# Patient Record
Sex: Female | Born: 1935
Health system: Southern US, Community
[De-identification: ages and names within clinical notes are randomized; demographics above are authoritative.]

## PROBLEM LIST (undated history)

## (undated) DIAGNOSIS — F32A Depression, unspecified: Secondary | ICD-10-CM

## (undated) DIAGNOSIS — A0472 Enterocolitis due to Clostridium difficile, not specified as recurrent: Secondary | ICD-10-CM

## (undated) DIAGNOSIS — Z992 Dependence on renal dialysis: Secondary | ICD-10-CM

## (undated) DIAGNOSIS — F329 Major depressive disorder, single episode, unspecified: Secondary | ICD-10-CM

## (undated) DIAGNOSIS — D649 Anemia, unspecified: Secondary | ICD-10-CM

## (undated) DIAGNOSIS — E1140 Type 2 diabetes mellitus with diabetic neuropathy, unspecified: Secondary | ICD-10-CM

## (undated) DIAGNOSIS — Z89511 Acquired absence of right leg below knee: Secondary | ICD-10-CM

## (undated) DIAGNOSIS — I639 Cerebral infarction, unspecified: Secondary | ICD-10-CM

## (undated) DIAGNOSIS — Z22322 Carrier or suspected carrier of Methicillin resistant Staphylococcus aureus: Secondary | ICD-10-CM

## (undated) DIAGNOSIS — E669 Obesity, unspecified: Secondary | ICD-10-CM

## (undated) DIAGNOSIS — M1990 Unspecified osteoarthritis, unspecified site: Secondary | ICD-10-CM

## (undated) DIAGNOSIS — K219 Gastro-esophageal reflux disease without esophagitis: Secondary | ICD-10-CM

## (undated) DIAGNOSIS — N186 End stage renal disease: Secondary | ICD-10-CM

## (undated) DIAGNOSIS — I739 Peripheral vascular disease, unspecified: Secondary | ICD-10-CM

## (undated) HISTORY — PX: COLONOSCOPY: SHX174

## (undated) HISTORY — DX: Obesity, unspecified: E66.9

## (undated) HISTORY — DX: Enterocolitis due to Clostridium difficile, not specified as recurrent: A04.72

## (undated) HISTORY — DX: Anemia, unspecified: D64.9

## (undated) HISTORY — DX: Peripheral vascular disease, unspecified: I73.9

## (undated) HISTORY — PX: ABDOMINAL HYSTERECTOMY: SHX81

## (undated) HISTORY — DX: Unspecified osteoarthritis, unspecified site: M19.90

## (undated) HISTORY — DX: Major depressive disorder, single episode, unspecified: F32.9

## (undated) HISTORY — DX: Carrier or suspected carrier of methicillin resistant Staphylococcus aureus: Z22.322

## (undated) HISTORY — DX: Cerebral infarction, unspecified: I63.9

## (undated) HISTORY — DX: Depression, unspecified: F32.A

## (undated) HISTORY — DX: Gastro-esophageal reflux disease without esophagitis: K21.9

## (undated) HISTORY — DX: Type 2 diabetes mellitus with diabetic neuropathy, unspecified: E11.40

## (undated) HISTORY — PX: MULTIPLE TOOTH EXTRACTIONS: SHX2053

---

## 2002-10-29 ENCOUNTER — Ambulatory Visit (HOSPITAL_COMMUNITY): Admission: RE | Admit: 2002-10-29 | Discharge: 2002-10-29 | Payer: Self-pay | Admitting: Internal Medicine

## 2002-10-29 ENCOUNTER — Encounter: Payer: Self-pay | Admitting: Internal Medicine

## 2002-11-05 ENCOUNTER — Encounter: Payer: Self-pay | Admitting: Internal Medicine

## 2002-11-05 ENCOUNTER — Ambulatory Visit (HOSPITAL_COMMUNITY): Admission: RE | Admit: 2002-11-05 | Discharge: 2002-11-05 | Payer: Self-pay | Admitting: Internal Medicine

## 2002-11-20 ENCOUNTER — Ambulatory Visit (HOSPITAL_COMMUNITY): Admission: RE | Admit: 2002-11-20 | Discharge: 2002-11-20 | Payer: Self-pay | Admitting: Internal Medicine

## 2002-11-20 ENCOUNTER — Encounter: Payer: Self-pay | Admitting: Internal Medicine

## 2002-12-27 ENCOUNTER — Ambulatory Visit (HOSPITAL_COMMUNITY): Admission: RE | Admit: 2002-12-27 | Discharge: 2002-12-27 | Payer: Self-pay | Admitting: Orthopedic Surgery

## 2003-07-13 ENCOUNTER — Ambulatory Visit (HOSPITAL_COMMUNITY): Admission: RE | Admit: 2003-07-13 | Discharge: 2003-07-13 | Payer: Self-pay | Admitting: Internal Medicine

## 2003-08-30 ENCOUNTER — Emergency Department (HOSPITAL_COMMUNITY): Admission: EM | Admit: 2003-08-30 | Discharge: 2003-08-31 | Payer: Self-pay | Admitting: Emergency Medicine

## 2003-11-28 ENCOUNTER — Encounter: Admission: RE | Admit: 2003-11-28 | Discharge: 2003-11-28 | Payer: Self-pay | Admitting: Internal Medicine

## 2004-06-08 ENCOUNTER — Inpatient Hospital Stay (HOSPITAL_COMMUNITY): Admission: AD | Admit: 2004-06-08 | Discharge: 2004-06-12 | Payer: Self-pay | Admitting: Internal Medicine

## 2004-06-08 ENCOUNTER — Ambulatory Visit: Payer: Self-pay | Admitting: Physical Medicine & Rehabilitation

## 2004-06-12 ENCOUNTER — Inpatient Hospital Stay: Admission: RE | Admit: 2004-06-12 | Discharge: 2004-06-23 | Payer: Self-pay | Admitting: *Deleted

## 2004-06-13 ENCOUNTER — Encounter (INDEPENDENT_AMBULATORY_CARE_PROVIDER_SITE_OTHER): Payer: Self-pay | Admitting: *Deleted

## 2005-01-18 ENCOUNTER — Encounter (HOSPITAL_COMMUNITY): Admission: RE | Admit: 2005-01-18 | Discharge: 2005-04-18 | Payer: Self-pay | Admitting: Nephrology

## 2005-01-19 ENCOUNTER — Ambulatory Visit: Payer: Self-pay | Admitting: Oncology

## 2005-02-25 ENCOUNTER — Encounter: Admission: RE | Admit: 2005-02-25 | Discharge: 2005-02-25 | Payer: Self-pay | Admitting: Family Medicine

## 2005-03-25 ENCOUNTER — Ambulatory Visit: Payer: Self-pay | Admitting: Oncology

## 2005-04-26 ENCOUNTER — Encounter (HOSPITAL_COMMUNITY): Admission: RE | Admit: 2005-04-26 | Discharge: 2005-07-25 | Payer: Self-pay | Admitting: Nephrology

## 2005-06-25 ENCOUNTER — Ambulatory Visit: Payer: Self-pay | Admitting: Oncology

## 2005-08-02 ENCOUNTER — Encounter (HOSPITAL_COMMUNITY): Admission: RE | Admit: 2005-08-02 | Discharge: 2005-10-31 | Payer: Self-pay | Admitting: Nephrology

## 2005-10-14 ENCOUNTER — Ambulatory Visit: Payer: Self-pay | Admitting: Oncology

## 2005-10-18 LAB — CBC WITH DIFFERENTIAL/PLATELET
BASO%: 0.1 % (ref 0.0–2.0)
Basophils Absolute: 0 10*3/uL (ref 0.0–0.1)
EOS%: 1.7 % (ref 0.0–7.0)
HCT: 34 % — ABNORMAL LOW (ref 34.8–46.6)
HGB: 10.4 g/dL — ABNORMAL LOW (ref 11.6–15.9)
LYMPH%: 12.3 % — ABNORMAL LOW (ref 14.0–48.0)
MCH: 23.8 pg — ABNORMAL LOW (ref 26.0–34.0)
MCHC: 30.8 g/dL — ABNORMAL LOW (ref 32.0–36.0)
MCV: 77.4 fL — ABNORMAL LOW (ref 81.0–101.0)
NEUT%: 81.4 % — ABNORMAL HIGH (ref 39.6–76.8)
Platelets: 412 10*3/uL — ABNORMAL HIGH (ref 145–400)

## 2005-10-18 LAB — IGG, IGA, IGM
IgG (Immunoglobin G), Serum: 1100 mg/dL (ref 694–1618)
IgM, Serum: 189 mg/dL (ref 60–263)

## 2005-10-18 LAB — IRON AND TIBC: Iron: 39 ug/dL — ABNORMAL LOW (ref 42–145)

## 2005-10-18 LAB — COMPREHENSIVE METABOLIC PANEL
ALT: <8 U/L (ref 0–40)
AST: 8 U/L (ref 0–37)
Alkaline Phosphatase: 124 U/L — ABNORMAL HIGH (ref 39–117)
CO2: 20 mEq/L (ref 19–32)
Sodium: 138 mEq/L (ref 135–145)
Total Bilirubin: 0.3 mg/dL (ref 0.3–1.2)
Total Protein: 6.9 g/dL (ref 6.0–8.3)

## 2005-10-18 LAB — FERRITIN: Ferritin: 744 ng/mL — ABNORMAL HIGH (ref 10–291)

## 2005-11-01 ENCOUNTER — Encounter (HOSPITAL_COMMUNITY): Admission: RE | Admit: 2005-11-01 | Discharge: 2006-01-21 | Payer: Self-pay | Admitting: Nephrology

## 2006-01-31 ENCOUNTER — Encounter (HOSPITAL_COMMUNITY): Admission: RE | Admit: 2006-01-31 | Discharge: 2006-04-29 | Payer: Self-pay | Admitting: Nephrology

## 2006-02-14 ENCOUNTER — Ambulatory Visit: Payer: Self-pay | Admitting: Oncology

## 2006-03-02 ENCOUNTER — Encounter: Admission: RE | Admit: 2006-03-02 | Discharge: 2006-03-02 | Payer: Self-pay | Admitting: Family Medicine

## 2006-04-21 ENCOUNTER — Encounter (INDEPENDENT_AMBULATORY_CARE_PROVIDER_SITE_OTHER): Payer: Self-pay | Admitting: *Deleted

## 2006-04-21 ENCOUNTER — Encounter: Admission: RE | Admit: 2006-04-21 | Discharge: 2006-04-21 | Payer: Self-pay | Admitting: Oncology

## 2006-05-02 ENCOUNTER — Encounter (HOSPITAL_COMMUNITY): Admission: RE | Admit: 2006-05-02 | Discharge: 2006-07-31 | Payer: Self-pay | Admitting: Nephrology

## 2006-05-24 ENCOUNTER — Encounter: Admission: RE | Admit: 2006-05-24 | Discharge: 2006-05-24 | Payer: Self-pay | Admitting: Surgery

## 2006-05-25 ENCOUNTER — Ambulatory Visit (HOSPITAL_BASED_OUTPATIENT_CLINIC_OR_DEPARTMENT_OTHER): Admission: RE | Admit: 2006-05-25 | Discharge: 2006-05-25 | Payer: Self-pay | Admitting: Surgery

## 2006-05-25 ENCOUNTER — Encounter (INDEPENDENT_AMBULATORY_CARE_PROVIDER_SITE_OTHER): Payer: Self-pay | Admitting: *Deleted

## 2006-05-25 ENCOUNTER — Encounter: Admission: RE | Admit: 2006-05-25 | Discharge: 2006-05-25 | Payer: Self-pay | Admitting: Surgery

## 2006-05-25 HISTORY — PX: BREAST EXCISIONAL BIOPSY: SUR124

## 2006-06-08 ENCOUNTER — Ambulatory Visit: Payer: Self-pay | Admitting: Oncology

## 2006-06-13 LAB — CBC WITH DIFFERENTIAL/PLATELET
BASO%: 0.3 % (ref 0.0–2.0)
EOS%: 2.2 % (ref 0.0–7.0)
LYMPH%: 15 % (ref 14.0–48.0)
MCHC: 31.7 g/dL — ABNORMAL LOW (ref 32.0–36.0)
MCV: 74.1 fL — ABNORMAL LOW (ref 81.0–101.0)
MONO%: 4.9 % (ref 0.0–13.0)
NEUT#: 7.1 10*3/uL — ABNORMAL HIGH (ref 1.5–6.5)
Platelets: 347 10*3/uL (ref 145–400)
RBC: 4.93 10*6/uL (ref 3.70–5.32)
RDW: 16.7 % — ABNORMAL HIGH (ref 11.3–14.5)

## 2006-06-20 LAB — COMPREHENSIVE METABOLIC PANEL
ALT: 8 U/L (ref 0–35)
AST: 11 U/L (ref 0–37)
Albumin: 3.6 g/dL (ref 3.5–5.2)
Alkaline Phosphatase: 101 U/L (ref 39–117)
Potassium: 4.6 mEq/L (ref 3.5–5.3)
Sodium: 139 mEq/L (ref 135–145)
Total Bilirubin: 0.4 mg/dL (ref 0.3–1.2)
Total Protein: 6.5 g/dL (ref 6.0–8.3)

## 2006-06-20 LAB — IMMUNOFIXATION ELECTROPHORESIS
IgM, Serum: 199 mg/dL (ref 60–263)
Total Protein, Serum Electrophoresis: 6.5 g/dL (ref 6.0–8.3)

## 2006-08-08 ENCOUNTER — Encounter (HOSPITAL_COMMUNITY): Admission: RE | Admit: 2006-08-08 | Discharge: 2006-11-06 | Payer: Self-pay | Admitting: Nephrology

## 2006-09-22 ENCOUNTER — Emergency Department (HOSPITAL_COMMUNITY): Admission: EM | Admit: 2006-09-22 | Discharge: 2006-09-23 | Payer: Self-pay | Admitting: Family Medicine

## 2006-10-03 ENCOUNTER — Emergency Department (HOSPITAL_COMMUNITY): Admission: EM | Admit: 2006-10-03 | Discharge: 2006-10-03 | Payer: Self-pay | Admitting: Family Medicine

## 2006-10-06 ENCOUNTER — Ambulatory Visit: Payer: Self-pay | Admitting: Oncology

## 2006-10-10 LAB — CBC WITH DIFFERENTIAL/PLATELET
Basophils Absolute: 0 10*3/uL (ref 0.0–0.1)
EOS%: 0.1 % (ref 0.0–7.0)
HGB: 10.1 g/dL — ABNORMAL LOW (ref 11.6–15.9)
MCH: 24.1 pg — ABNORMAL LOW (ref 26.0–34.0)
MONO#: 0.7 10*3/uL (ref 0.1–0.9)
NEUT#: 19 10*3/uL — ABNORMAL HIGH (ref 1.5–6.5)
RDW: 17.2 % — ABNORMAL HIGH (ref 11.3–14.5)
WBC: 20.6 10*3/uL — ABNORMAL HIGH (ref 3.9–10.0)
lymph#: 0.9 10*3/uL (ref 0.9–3.3)

## 2006-10-10 LAB — COMPREHENSIVE METABOLIC PANEL
ALT: 31 U/L (ref 0–35)
AST: 33 U/L (ref 0–37)
Albumin: 3.4 g/dL — ABNORMAL LOW (ref 3.5–5.2)
BUN: 87 mg/dL — ABNORMAL HIGH (ref 6–23)
CO2: 14 mEq/L — ABNORMAL LOW (ref 19–32)
Calcium: 8.7 mg/dL (ref 8.4–10.5)
Chloride: 109 mEq/L (ref 96–112)
Potassium: 4.9 mEq/L (ref 3.5–5.3)

## 2006-10-10 LAB — LACTATE DEHYDROGENASE: LDH: 214 U/L (ref 94–250)

## 2006-10-10 LAB — IGG, IGA, IGM
IgA: 207 mg/dL (ref 68–378)
IgG (Immunoglobin G), Serum: 966 mg/dL (ref 694–1618)

## 2006-10-13 ENCOUNTER — Inpatient Hospital Stay (HOSPITAL_COMMUNITY): Admission: EM | Admit: 2006-10-13 | Discharge: 2006-10-20 | Payer: Self-pay | Admitting: *Deleted

## 2006-10-13 ENCOUNTER — Encounter (INDEPENDENT_AMBULATORY_CARE_PROVIDER_SITE_OTHER): Payer: Self-pay | Admitting: Diagnostic Radiology

## 2006-10-18 ENCOUNTER — Ambulatory Visit: Payer: Self-pay | Admitting: Physical Medicine & Rehabilitation

## 2006-11-07 ENCOUNTER — Inpatient Hospital Stay (HOSPITAL_COMMUNITY): Admission: EM | Admit: 2006-11-07 | Discharge: 2006-11-25 | Payer: Self-pay | Admitting: Emergency Medicine

## 2006-11-14 ENCOUNTER — Ambulatory Visit: Payer: Self-pay | Admitting: Thoracic Surgery

## 2006-11-14 ENCOUNTER — Ambulatory Visit: Payer: Self-pay | Admitting: Infectious Diseases

## 2006-11-24 ENCOUNTER — Encounter (INDEPENDENT_AMBULATORY_CARE_PROVIDER_SITE_OTHER): Payer: Self-pay | Admitting: Internal Medicine

## 2006-12-05 ENCOUNTER — Encounter (HOSPITAL_COMMUNITY): Admission: RE | Admit: 2006-12-05 | Discharge: 2007-03-05 | Payer: Self-pay | Admitting: Nephrology

## 2006-12-14 ENCOUNTER — Emergency Department (HOSPITAL_COMMUNITY): Admission: EM | Admit: 2006-12-14 | Discharge: 2006-12-14 | Payer: Self-pay | Admitting: Emergency Medicine

## 2006-12-16 ENCOUNTER — Inpatient Hospital Stay (HOSPITAL_COMMUNITY): Admission: EM | Admit: 2006-12-16 | Discharge: 2007-01-05 | Payer: Self-pay | Admitting: Emergency Medicine

## 2006-12-16 ENCOUNTER — Ambulatory Visit: Payer: Self-pay | Admitting: Pulmonary Disease

## 2006-12-17 ENCOUNTER — Encounter (INDEPENDENT_AMBULATORY_CARE_PROVIDER_SITE_OTHER): Payer: Self-pay | Admitting: *Deleted

## 2006-12-23 ENCOUNTER — Ambulatory Visit: Payer: Self-pay | Admitting: Infectious Diseases

## 2006-12-24 ENCOUNTER — Encounter (INDEPENDENT_AMBULATORY_CARE_PROVIDER_SITE_OTHER): Payer: Self-pay | Admitting: Internal Medicine

## 2006-12-24 ENCOUNTER — Ambulatory Visit: Payer: Self-pay | Admitting: *Deleted

## 2007-03-08 ENCOUNTER — Ambulatory Visit: Payer: Self-pay | Admitting: Vascular Surgery

## 2007-03-20 ENCOUNTER — Ambulatory Visit: Payer: Self-pay | Admitting: Vascular Surgery

## 2007-03-20 ENCOUNTER — Ambulatory Visit (HOSPITAL_COMMUNITY): Admission: RE | Admit: 2007-03-20 | Discharge: 2007-03-20 | Payer: Self-pay | Admitting: Vascular Surgery

## 2007-03-29 ENCOUNTER — Ambulatory Visit (HOSPITAL_COMMUNITY): Admission: RE | Admit: 2007-03-29 | Discharge: 2007-03-29 | Payer: Self-pay | Admitting: Vascular Surgery

## 2007-04-03 ENCOUNTER — Ambulatory Visit: Payer: Self-pay | Admitting: Oncology

## 2007-04-19 ENCOUNTER — Ambulatory Visit: Payer: Self-pay | Admitting: Vascular Surgery

## 2007-04-24 ENCOUNTER — Ambulatory Visit: Payer: Self-pay | Admitting: Vascular Surgery

## 2007-04-24 ENCOUNTER — Ambulatory Visit (HOSPITAL_COMMUNITY): Admission: RE | Admit: 2007-04-24 | Discharge: 2007-04-24 | Payer: Self-pay | Admitting: Vascular Surgery

## 2007-04-29 ENCOUNTER — Emergency Department (HOSPITAL_COMMUNITY): Admission: EM | Admit: 2007-04-29 | Discharge: 2007-04-29 | Payer: Self-pay | Admitting: Emergency Medicine

## 2007-05-02 ENCOUNTER — Ambulatory Visit (HOSPITAL_COMMUNITY): Admission: RE | Admit: 2007-05-02 | Discharge: 2007-05-02 | Payer: Self-pay | Admitting: Vascular Surgery

## 2007-05-09 ENCOUNTER — Ambulatory Visit (HOSPITAL_COMMUNITY): Admission: RE | Admit: 2007-05-09 | Discharge: 2007-05-09 | Payer: Self-pay | Admitting: Vascular Surgery

## 2007-06-21 ENCOUNTER — Ambulatory Visit: Payer: Self-pay | Admitting: Vascular Surgery

## 2007-07-05 ENCOUNTER — Ambulatory Visit: Payer: Self-pay | Admitting: Vascular Surgery

## 2007-08-25 IMAGING — CR DG CHEST 1V
1 series · 1 of 1 positions shown · non-contrast
Comparison: 12/26/06.

CLINICAL DATA: Renal failure.  Status post dialysis.
 CHEST ? 1 VIEW ? 12/29/06:

[view not recorded]
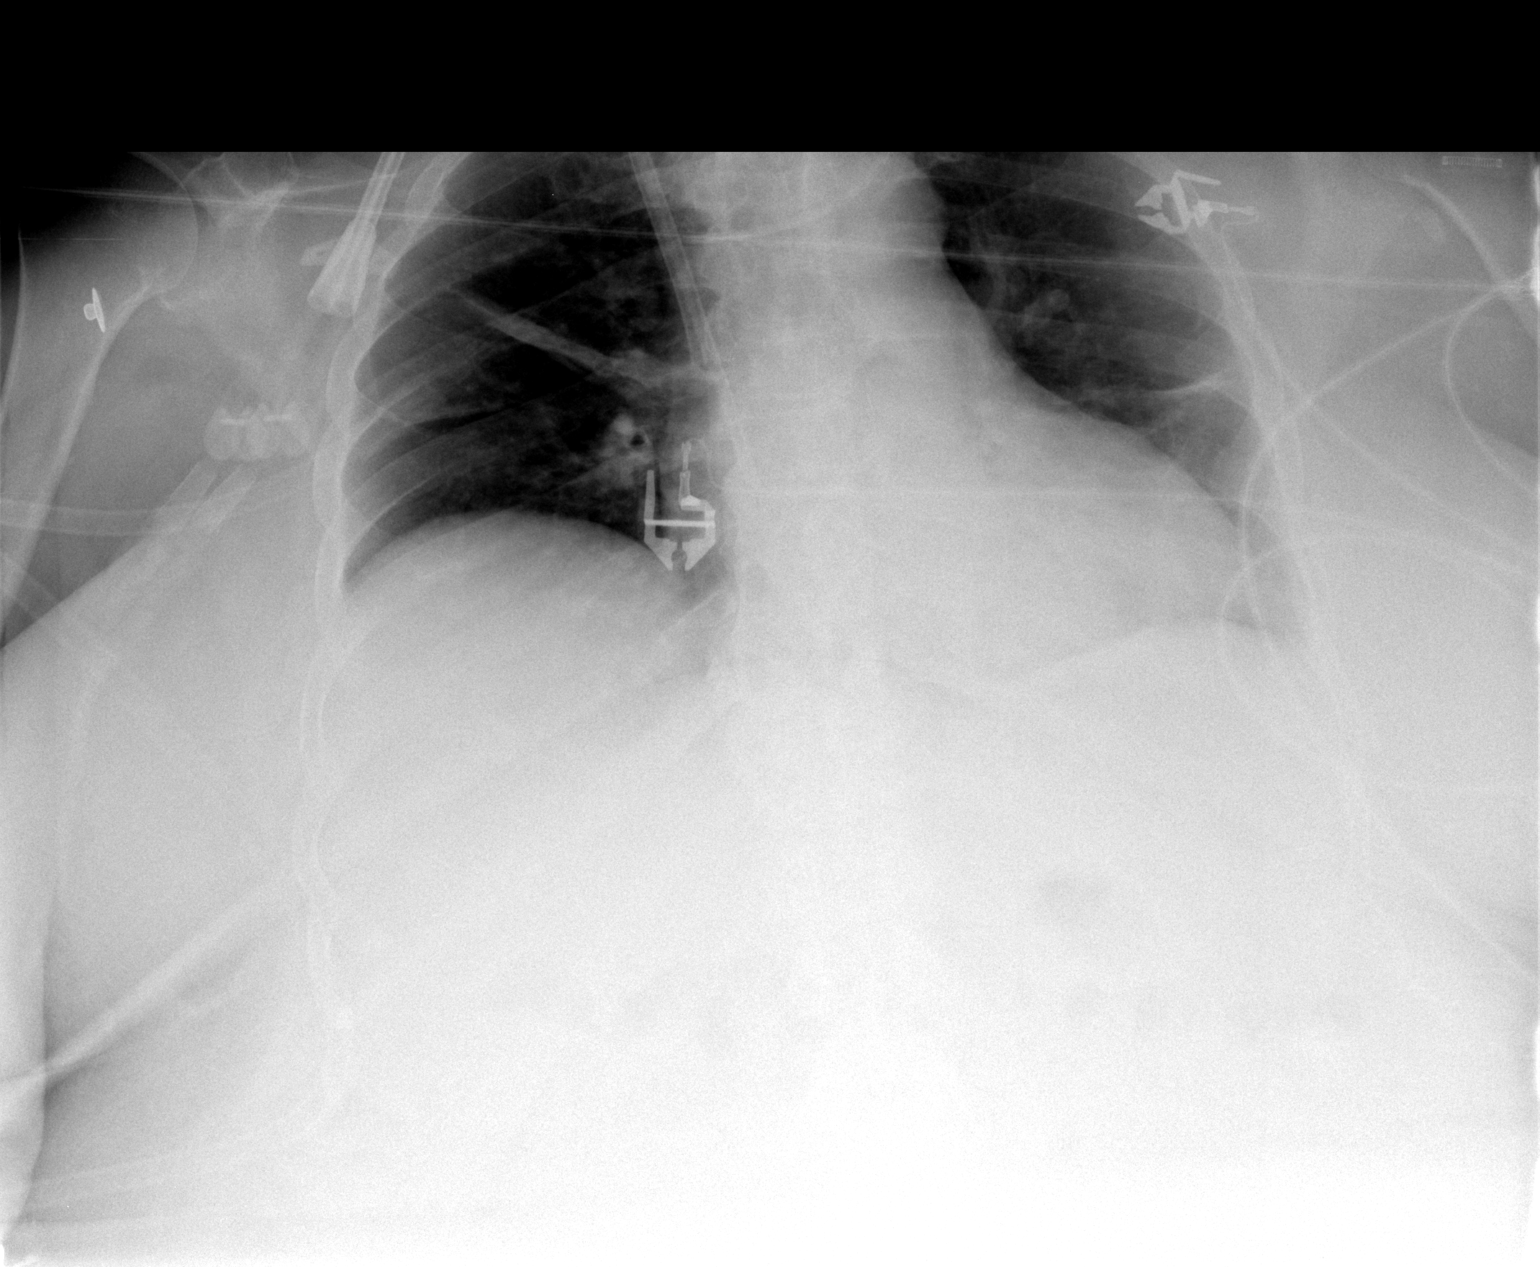

[1 of 1 positions shown; findings below may reference images not displayed]

FINDINGS: Pulmonary edema is markedly improved.  Mild bilateral atelectasis is noted.  Elevation of the right hemidiaphragm is again seen.  No definite effusion.  The heart size is normal.
IMPRESSION: Marked improvement in pulmonary edema with mild bilateral atelectasis noted.

## 2007-11-15 ENCOUNTER — Ambulatory Visit: Payer: Self-pay | Admitting: Vascular Surgery

## 2007-11-27 ENCOUNTER — Encounter: Admission: RE | Admit: 2007-11-27 | Discharge: 2007-11-27 | Payer: Self-pay | Admitting: Family Medicine

## 2008-02-26 ENCOUNTER — Encounter: Admission: RE | Admit: 2008-02-26 | Discharge: 2008-02-26 | Payer: Self-pay | Admitting: Family Medicine

## 2008-06-21 ENCOUNTER — Ambulatory Visit (HOSPITAL_COMMUNITY): Admission: RE | Admit: 2008-06-21 | Discharge: 2008-06-21 | Payer: Self-pay | Admitting: Nephrology

## 2008-06-24 ENCOUNTER — Ambulatory Visit: Payer: Self-pay | Admitting: Surgery

## 2008-07-12 ENCOUNTER — Ambulatory Visit (HOSPITAL_COMMUNITY): Admission: RE | Admit: 2008-07-12 | Discharge: 2008-07-12 | Payer: Self-pay | Admitting: Surgery

## 2008-07-12 ENCOUNTER — Ambulatory Visit: Payer: Self-pay | Admitting: Surgery

## 2008-07-18 ENCOUNTER — Ambulatory Visit (HOSPITAL_COMMUNITY): Admission: RE | Admit: 2008-07-18 | Discharge: 2008-07-18 | Payer: Self-pay | Admitting: Vascular Surgery

## 2008-07-24 ENCOUNTER — Emergency Department (HOSPITAL_COMMUNITY): Admission: EM | Admit: 2008-07-24 | Discharge: 2008-07-24 | Payer: Self-pay | Admitting: Emergency Medicine

## 2008-08-05 ENCOUNTER — Ambulatory Visit: Payer: Self-pay | Admitting: Surgery

## 2008-08-14 ENCOUNTER — Ambulatory Visit (HOSPITAL_COMMUNITY): Admission: RE | Admit: 2008-08-14 | Discharge: 2008-08-14 | Payer: Self-pay | Admitting: Surgery

## 2008-10-04 ENCOUNTER — Ambulatory Visit: Payer: Self-pay | Admitting: Surgery

## 2008-10-04 ENCOUNTER — Ambulatory Visit (HOSPITAL_COMMUNITY): Admission: RE | Admit: 2008-10-04 | Discharge: 2008-10-04 | Payer: Self-pay | Admitting: Surgery

## 2008-11-04 ENCOUNTER — Ambulatory Visit: Payer: Self-pay | Admitting: Surgery

## 2008-11-08 ENCOUNTER — Ambulatory Visit: Payer: Self-pay | Admitting: Surgery

## 2008-11-08 ENCOUNTER — Ambulatory Visit (HOSPITAL_COMMUNITY): Admission: RE | Admit: 2008-11-08 | Discharge: 2008-11-08 | Payer: Self-pay | Admitting: Surgery

## 2008-12-04 ENCOUNTER — Encounter: Admission: RE | Admit: 2008-12-04 | Discharge: 2008-12-04 | Payer: Self-pay | Admitting: Family Medicine

## 2009-02-24 ENCOUNTER — Ambulatory Visit: Payer: Self-pay | Admitting: Surgery

## 2009-03-07 ENCOUNTER — Ambulatory Visit: Payer: Self-pay | Admitting: Surgery

## 2009-03-07 ENCOUNTER — Ambulatory Visit (HOSPITAL_COMMUNITY): Admission: RE | Admit: 2009-03-07 | Discharge: 2009-03-08 | Payer: Self-pay | Admitting: Surgery

## 2009-04-16 ENCOUNTER — Ambulatory Visit: Payer: Self-pay | Admitting: Vascular Surgery

## 2009-04-16 ENCOUNTER — Ambulatory Visit (HOSPITAL_COMMUNITY): Admission: RE | Admit: 2009-04-16 | Discharge: 2009-04-16 | Payer: Self-pay | Admitting: Vascular Surgery

## 2009-04-29 ENCOUNTER — Ambulatory Visit: Payer: Self-pay | Admitting: Vascular Surgery

## 2009-05-24 ENCOUNTER — Emergency Department (HOSPITAL_COMMUNITY): Admission: EM | Admit: 2009-05-24 | Discharge: 2009-05-24 | Payer: Self-pay | Admitting: Emergency Medicine

## 2009-08-22 ENCOUNTER — Ambulatory Visit (HOSPITAL_COMMUNITY): Admission: RE | Admit: 2009-08-22 | Discharge: 2009-08-22 | Payer: Self-pay | Admitting: Nephrology

## 2009-11-14 ENCOUNTER — Encounter: Admission: RE | Admit: 2009-11-14 | Discharge: 2009-11-14 | Payer: Self-pay | Admitting: Family Medicine

## 2009-11-24 ENCOUNTER — Encounter: Admission: RE | Admit: 2009-11-24 | Discharge: 2009-11-24 | Payer: Self-pay | Admitting: Family Medicine

## 2010-01-16 ENCOUNTER — Ambulatory Visit (HOSPITAL_COMMUNITY): Admission: RE | Admit: 2010-01-16 | Discharge: 2010-01-16 | Payer: Self-pay | Admitting: Nephrology

## 2010-01-23 ENCOUNTER — Ambulatory Visit (HOSPITAL_COMMUNITY): Admission: RE | Admit: 2010-01-23 | Discharge: 2010-01-23 | Payer: Self-pay | Admitting: Nephrology

## 2010-03-23 ENCOUNTER — Ambulatory Visit (HOSPITAL_COMMUNITY)
Admission: RE | Admit: 2010-03-23 | Discharge: 2010-03-23 | Payer: Self-pay | Source: Home / Self Care | Admitting: Nephrology

## 2010-06-01 ENCOUNTER — Ambulatory Visit (HOSPITAL_COMMUNITY)
Admission: RE | Admit: 2010-06-01 | Discharge: 2010-06-01 | Payer: Self-pay | Source: Home / Self Care | Attending: Nephrology | Admitting: Nephrology

## 2010-07-22 ENCOUNTER — Other Ambulatory Visit: Payer: Self-pay | Admitting: Obstetrics and Gynecology

## 2010-08-13 ENCOUNTER — Other Ambulatory Visit (HOSPITAL_COMMUNITY): Payer: Self-pay | Admitting: Nephrology

## 2010-08-13 DIAGNOSIS — N186 End stage renal disease: Secondary | ICD-10-CM

## 2010-08-13 LAB — GLUCOSE, CAPILLARY
Glucose-Capillary: 109 mg/dL — ABNORMAL HIGH (ref 70–99)
Glucose-Capillary: 133 mg/dL — ABNORMAL HIGH (ref 70–99)

## 2010-08-13 LAB — RENAL FUNCTION PANEL
Albumin: 2.9 g/dL — ABNORMAL LOW (ref 3.5–5.2)
BUN: 41 mg/dL — ABNORMAL HIGH (ref 6–23)
Calcium: 8.1 mg/dL — ABNORMAL LOW (ref 8.4–10.5)
Chloride: 107 mEq/L (ref 96–112)
GFR calc Af Amer: 5 mL/min — ABNORMAL LOW (ref >60)
GFR calc non Af Amer: 4 mL/min — ABNORMAL LOW (ref >60)
GFR calc non Af Amer: 5 mL/min — ABNORMAL LOW (ref >60)
Glucose, Bld: 109 mg/dL — ABNORMAL HIGH (ref 70–99)
Glucose, Bld: 112 mg/dL — ABNORMAL HIGH (ref 70–99)
Phosphorus: 4.9 mg/dL — ABNORMAL HIGH (ref 2.3–4.6)
Phosphorus: 5.4 mg/dL — ABNORMAL HIGH (ref 2.3–4.6)
Potassium: 5.3 mEq/L — ABNORMAL HIGH (ref 3.5–5.1)
Potassium: 5.5 mEq/L — ABNORMAL HIGH (ref 3.5–5.1)
Sodium: 135 mEq/L (ref 135–145)
Sodium: 137 mEq/L (ref 135–145)

## 2010-08-13 LAB — POCT I-STAT 4, (NA,K, GLUC, HGB,HCT)
Glucose, Bld: 121 mg/dL — ABNORMAL HIGH (ref 70–99)
Hemoglobin: 15.6 g/dL — ABNORMAL HIGH (ref 12.0–15.0)
Potassium: 4.8 mEq/L (ref 3.5–5.1)
Sodium: 138 mEq/L (ref 135–145)

## 2010-08-13 LAB — CBC
MCHC: 31.1 g/dL (ref 30.0–36.0)
RDW: 21.5 % — ABNORMAL HIGH (ref 11.5–15.5)

## 2010-08-16 LAB — POCT I-STAT 4, (NA,K, GLUC, HGB,HCT)
HCT: 35 % — ABNORMAL LOW (ref 36.0–46.0)
Potassium: 3.7 mEq/L (ref 3.5–5.1)
Sodium: 141 mEq/L (ref 135–145)

## 2010-08-16 LAB — GLUCOSE, CAPILLARY: Glucose-Capillary: 171 mg/dL — ABNORMAL HIGH (ref 70–99)

## 2010-08-18 LAB — POCT I-STAT 4, (NA,K, GLUC, HGB,HCT)
Glucose, Bld: 163 mg/dL — ABNORMAL HIGH (ref 70–99)
Hemoglobin: 15.3 g/dL — ABNORMAL HIGH (ref 12.0–15.0)
Potassium: 4.3 mEq/L (ref 3.5–5.1)

## 2010-08-18 LAB — GLUCOSE, CAPILLARY
Glucose-Capillary: 129 mg/dL — ABNORMAL HIGH (ref 70–99)
Glucose-Capillary: 140 mg/dL — ABNORMAL HIGH (ref 70–99)

## 2010-08-19 ENCOUNTER — Ambulatory Visit (HOSPITAL_COMMUNITY): Payer: Self-pay

## 2010-08-20 LAB — COMPREHENSIVE METABOLIC PANEL
ALT: 11 U/L (ref 0–35)
AST: 24 U/L (ref 0–37)
Albumin: 3.4 g/dL — ABNORMAL LOW (ref 3.5–5.2)
Calcium: 9.2 mg/dL (ref 8.4–10.5)
GFR calc Af Amer: 6 mL/min — ABNORMAL LOW (ref >60)
Potassium: 4.9 mEq/L (ref 3.5–5.1)
Sodium: 139 mEq/L (ref 135–145)
Total Protein: 7.2 g/dL (ref 6.0–8.3)

## 2010-08-20 LAB — CBC
MCHC: 31 g/dL (ref 30.0–36.0)
RDW: 17.9 % — ABNORMAL HIGH (ref 11.5–15.5)

## 2010-08-20 LAB — POCT I-STAT 4, (NA,K, GLUC, HGB,HCT)
Glucose, Bld: 148 mg/dL — ABNORMAL HIGH (ref 70–99)
HCT: 43 % (ref 36.0–46.0)
Hemoglobin: 14.6 g/dL (ref 12.0–15.0)
Potassium: 4.4 mEq/L (ref 3.5–5.1)
Sodium: 136 mEq/L (ref 135–145)
Sodium: 138 mEq/L (ref 135–145)

## 2010-08-20 LAB — DIFFERENTIAL
Eosinophils Absolute: 0.2 10*3/uL (ref 0.0–0.7)
Eosinophils Relative: 1 % (ref 0–5)
Lymphs Abs: 1.5 10*3/uL (ref 0.7–4.0)
Monocytes Absolute: 0.9 10*3/uL (ref 0.1–1.0)
Monocytes Relative: 8 % (ref 3–12)

## 2010-08-20 LAB — GLUCOSE, CAPILLARY: Glucose-Capillary: 156 mg/dL — ABNORMAL HIGH (ref 70–99)

## 2010-08-20 LAB — PHOSPHORUS: Phosphorus: 4.4 mg/dL (ref 2.3–4.6)

## 2010-08-24 ENCOUNTER — Ambulatory Visit (HOSPITAL_COMMUNITY)
Admission: RE | Admit: 2010-08-24 | Discharge: 2010-08-24 | Disposition: A | Discharge status: Home / Self Care | Payer: Medicare Other | Source: Ambulatory Visit | Attending: Nephrology | Admitting: Nephrology

## 2010-08-24 ENCOUNTER — Other Ambulatory Visit (HOSPITAL_COMMUNITY): Payer: Self-pay | Admitting: Nephrology

## 2010-08-24 DIAGNOSIS — Y832 Surgical operation with anastomosis, bypass or graft as the cause of abnormal reaction of the patient, or of later complication, without mention of misadventure at the time of the procedure: Secondary | ICD-10-CM | POA: Insufficient documentation

## 2010-08-24 DIAGNOSIS — N186 End stage renal disease: Secondary | ICD-10-CM

## 2010-08-24 DIAGNOSIS — T82898A Other specified complication of vascular prosthetic devices, implants and grafts, initial encounter: Secondary | ICD-10-CM | POA: Insufficient documentation

## 2010-08-24 MED ORDER — IOHEXOL 300 MG/ML  SOLN
100.0000 mL | Freq: Once | INTRAMUSCULAR | Status: AC | PRN
  Administered 2010-08-24: 60 mL via INTRAVENOUS

## 2010-09-03 ENCOUNTER — Other Ambulatory Visit (HOSPITAL_COMMUNITY): Payer: Self-pay | Admitting: Nephrology

## 2010-09-03 DIAGNOSIS — N186 End stage renal disease: Secondary | ICD-10-CM

## 2010-09-03 DIAGNOSIS — T82868A Thrombosis of vascular prosthetic devices, implants and grafts, initial encounter: Secondary | ICD-10-CM

## 2010-09-04 ENCOUNTER — Ambulatory Visit (HOSPITAL_COMMUNITY)
Admission: RE | Admit: 2010-09-04 | Discharge: 2010-09-04 | Disposition: A | Discharge status: Home / Self Care | Payer: Medicare Other | Source: Ambulatory Visit | Attending: Nephrology | Admitting: Nephrology

## 2010-09-04 ENCOUNTER — Other Ambulatory Visit (HOSPITAL_COMMUNITY): Payer: Self-pay | Admitting: Nephrology

## 2010-09-04 DIAGNOSIS — Z8614 Personal history of Methicillin resistant Staphylococcus aureus infection: Secondary | ICD-10-CM | POA: Insufficient documentation

## 2010-09-04 DIAGNOSIS — Y832 Surgical operation with anastomosis, bypass or graft as the cause of abnormal reaction of the patient, or of later complication, without mention of misadventure at the time of the procedure: Secondary | ICD-10-CM | POA: Insufficient documentation

## 2010-09-04 DIAGNOSIS — T82868A Thrombosis of vascular prosthetic devices, implants and grafts, initial encounter: Secondary | ICD-10-CM

## 2010-09-04 DIAGNOSIS — F3289 Other specified depressive episodes: Secondary | ICD-10-CM | POA: Insufficient documentation

## 2010-09-04 DIAGNOSIS — N186 End stage renal disease: Secondary | ICD-10-CM | POA: Insufficient documentation

## 2010-09-04 DIAGNOSIS — N2581 Secondary hyperparathyroidism of renal origin: Secondary | ICD-10-CM | POA: Insufficient documentation

## 2010-09-04 DIAGNOSIS — F329 Major depressive disorder, single episode, unspecified: Secondary | ICD-10-CM | POA: Insufficient documentation

## 2010-09-04 DIAGNOSIS — E119 Type 2 diabetes mellitus without complications: Secondary | ICD-10-CM | POA: Insufficient documentation

## 2010-09-04 DIAGNOSIS — T82898A Other specified complication of vascular prosthetic devices, implants and grafts, initial encounter: Secondary | ICD-10-CM | POA: Insufficient documentation

## 2010-09-04 DIAGNOSIS — D649 Anemia, unspecified: Secondary | ICD-10-CM | POA: Insufficient documentation

## 2010-09-04 DIAGNOSIS — E669 Obesity, unspecified: Secondary | ICD-10-CM | POA: Insufficient documentation

## 2010-09-04 DIAGNOSIS — M1990 Unspecified osteoarthritis, unspecified site: Secondary | ICD-10-CM | POA: Insufficient documentation

## 2010-09-04 LAB — POTASSIUM: Potassium: 5.4 mEq/L — ABNORMAL HIGH (ref 3.5–5.1)

## 2010-09-04 MED ORDER — IOHEXOL 300 MG/ML  SOLN: 100.0000 mL | Freq: Once | INTRAMUSCULAR | Status: AC | PRN

## 2010-09-21 ENCOUNTER — Other Ambulatory Visit (HOSPITAL_COMMUNITY): Payer: Medicare Other

## 2010-09-22 NOTE — Discharge Summary (Signed)
[name], HIGHT NO.:  [id number]   MEDICAL RECORD NO.:  [id number]          PATIENT TYPE:  INP   LOCATION:  4743                         FACILITY:  [facility]   PHYSICIAN:  Altha Harm, MDDATE OF BIRTH:  09-26-1932   DATE OF ADMISSION:  12/16/2006  DATE OF DISCHARGE:                               DISCHARGE SUMMARY   PRIMARY CARE PHYSICIAN:  Dr. [name].   FINAL DIAGNOSIS:  Please refer to interim discharge summary by Dr.  [name] on December 20, 2006 for final discharge diagnoses.   CONSULTANTS:  Dr. [name] from infectious disease.   HISTORY OF PRESENT ILLNESS:  Please refer to the interim discharge  summary by Dr. [name].   HOSPITAL COURSE:  1. Acute on chronic renal insufficiency.  The patient's BUN and      creatinine continue to stay markedly elevated.  The patient has not      responded very well to Lasix or recovery of the kidney.  The      patient had hemodialysis access of a right internal jugular vein      placed today in anticipation of hemodialysis to start within the      next 24 hours.  2. Metabolic acidosis:  Resolved.  3. Generalized rash.  The patient was seen by dermatology, who      recommended that the patient have triamcinolone cream with Cetaphil      applied to the areas affected. Thus, the patient is currently      receiving triamcinolone and Cetaphil 1:2 ratio being applied to the      skin.  4. Hypoxemia.  The patient had a D-dimer performed, which was mildly      elevated.  There was some question as to whether or not the patient      might have had pulmonary embolus.  Dr. [name] from pulmonary saw the      patient, and offered the opinion that he did not feel the patient      did have pulmonary embolus, and at this time required no      anticoagulation.  5. Anemia:  The patient's hemoglobin has remained stable at 9.3.  6. Leukocytosis:  The patient had leukocytosis that was very slow to      resolved.  Infectious  disease was consulted, they recommended that      the patient actually start on Zyvox, as the patient's rash was      thought to be secondary to vancomycin.  The patient currently is on      Zyvox and ciprofloxacin.  7. Depression.  It appears that the patient may be having some      depression.  She certainly has reason to with the diagnosis.  Dr.      [name] has been consulted to assist in the care of this patient.   DISPOSITION:  Disposition will depend upon her response to dialysis and  her progress in therapy.      Altha Harm, MD  Electronically Signed     MAM/MEDQ  D:  12/27/2006  T:  12/27/2006  Job:  [id number]

## 2010-09-22 NOTE — Assessment & Plan Note (Signed)
OFFICE VISIT   SARINITY, DICICCO  DOB:  15-Jan-1933                                       04/29/2009  EAVWU#:[id number]   The patient had a right thigh graft inserted by Dr. Myra Gianotti in October  of this year and it has been utilized until the last month without  difficulty.  The catheter was removed on December 8.  She was referred  today for possible right thigh graft infection with positive blood  culture (gram positive cocci).  The patient any chills, fever or  purulent drainage from the graft.   PHYSICAL EXAMINATION:  On examination today blood pressure is 151/90,  heart rate 100, respirations 18.  She is afebrile.  Right thigh graft  was examined and there is no evidence of any infection including  purulent drainage, erythema or induration.  The inguinal incision has  healed nicely.  The graft has an excellent pulse and palpable thrill.  The site where the Palindrome catheter was removed from the right  anterior chest wall is also free of any inflammation or purulent  drainage.   I do not think the graft is the source of her positive blood cultures  but will continue treating her with appropriate antibiotics.  If she  shows signs of graft infection please refer her back to the office and I  will be happy to evaluate her again at that time.     Quita Skye Hart [place], M.D.  Electronically Signed   JDL/MEDQ  D:  04/29/2009  T:  04/30/2009  Job:  [id number]

## 2010-09-22 NOTE — Op Note (Signed)
[name], [name]                ACCOUNT NO.:  [id number]   MEDICAL RECORD NO.:  [id number]          PATIENT TYPE:  AMB   LOCATION:  SDS                          FACILITY:  [facility]   PHYSICIAN:  Charles E. Fields, MD  DATE OF BIRTH:  12/22/1932   DATE OF PROCEDURE:  04/24/2007  DATE OF DISCHARGE:                               OPERATIVE REPORT   PROCEDURE:  Right upper arm arteriovenous graft.   PREOPERATIVE DIAGNOSIS:  End-stage renal disease.   POSTOPERATIVE DIAGNOSIS:  End-stage renal disease.   ANESTHESIA:  Local IV sedation.   ASSISTANT:  Delight Hoh, R.N.F.A..   OPERATIVE FINDINGS:  1. A 4-7-mm tapered PTFE graft.  2. Venous anastomosis end-to-end to axillary vein.   OPERATIVE DETAILS:  After obtaining informed consent, the patient was  taken to the operating room.  The patient was placed in the supine  position on the operating room table.  After adequate sedation, the  patient's entire right upper extremity was prepped and draped in the  usual sterile fashion.  Local anesthesia was then infiltrated near the  antecubital crease.  A transverse incision made in this location and  carried down through subcutaneous tissues and the antecubital veins were  inspected.  The cephalic vein was quite small, less than 2 mm in  diameter.  Next, the brachial artery was dissected free in the medial  portion of the incision.  The adjacent deep brachial veins were also  quite small, less than 2 mm in diameter.  The brachial artery was  dissected free circumferentially and vessel loops placed proximal and  distal at the site of planned arteriotomy.  The brachial artery was  calcified with several islands of calcium.   Next, local anesthesia was infiltrated just above the antecubital crease  in order to try to find a larger lower arm vein.  The incision was  carried down through subcutaneous tissues down to the level of the  brachial artery just above the antecubital crease.  Again the  deep  brachial veins in this location were also quite small in caliber.  It  was decided that the patient would need to have an upper arm graft  placed.  Local anesthesia was then infiltrated up in the axilla.  A  longitudinal incision made in this location and carried down through  subcutaneous tissues down to the level of the axillary vein.  This was  dissected free circumferentially.  There were several side branches.  These were dissected free circumferentially and ligated and divided  between silk ties.  The axillary vein was then dissected free  circumferentially up higher into the axilla.  Next a subcutaneous tunnel  was created connecting the upper arm incision to the lower arm incision  and a 4-7-mm tapered PTFE graft was placed in the subcutaneous tissue.  The patient was then given 5000 units of intravenous heparin.  Vessel  loops were placed taut on the brachial artery and a longitudinal  arteriotomy was made.  The graft was slightly beveled and the 4-mm end  of the graft sewn end of graft to side of  artery using a running 6-0  Prolene suture.  Just prior to completion of the anastomosis this was  fore bled, back bled and thoroughly flushed.  The anastomosis was  secured.  Vessel loops were released.  There was palpable flow into the  graft immediately.  Next the distal axillary vein was ligated with a 3-0  silk tie and the vein transected and controlled proximally with a fine  bulldog clamp.  The vein was opened longitudinally.  The graft was  beveled and then sewn end graft to end of vein using a running 6-0  Prolene suture.  Just prior to completion of the anastomosis, this was  fore bled, back bled and thoroughly flushed.  The anastomosis was  secured, clamp was released, and there was a palpable thrill in the  graft immediately.  Next hemostasis was obtained.  Subcutaneous tissues  of all three incisions were then reapproximated using running 3-0 Vicryl  suture.  Skin of  all four three incisions then closed with a running 4-0  Vicryl subcuticular stitch.  The patient tolerated the procedure well  and there were no complications.  Instrument, sponge and needle count  was correct at the end of the case.  The patient had an audible radial  Doppler signal at the end of the case, which had minimal augmentation  with compression of the graft.      [name]. Fields, MD  Electronically Signed     CEF/MEDQ  D:  04/24/2007  T:  04/25/2007  Job:  [id number]

## 2010-09-22 NOTE — Op Note (Signed)
[name], [name]                ACCOUNT NO.:  [id number]   MEDICAL RECORD NO.:  [id number]          PATIENT TYPE:  AMB   LOCATION:  SDS                          FACILITY:  [facility]   PHYSICIAN:  VDurene Cal IV, MDDATE OF BIRTH:  19-Sep-1932   DATE OF PROCEDURE:  DATE OF DISCHARGE:  07/12/2008                               OPERATIVE REPORT   PREOPERATIVE DIAGNOSIS:  End-stage renal disease.   POSTOPERATIVE DIAGNOSIS:  End-stage renal disease.   PROCEDURE PERFORMED:  1. Ultrasound access left internal jugular vein.  2. Left central venous dialysis catheter placement.  3. Left upper arm AV Gore-Tex graft (HeRO catheter).   TYPE OF ANESTHESIA:  General.   BLOOD LOSS:  Minimal.   COMPLICATIONS:  None.   INDICATIONS:  This is a 77 year old female with end-stage renal disease  who is currently dialyzing through a right-sided central venous  catheter.  She has multiple upper arm graft failures.  She comes in for  a HeRO catheter.   PROCEDURE:  The patient was identified in the holding area and taken to  room 8.  She was placed supine on the table.  General endotracheal  anesthesia was administered.  The patient was prepped and draped in  standard sterile fashion.  A time-out was called and antibiotics were  given.  The left internal jugular vein was evaluated with ultrasound and  found to be widely patent, easily compressible.  It was accessed under  ultrasound guidance with an 18-gauge needle.  A 0.035 Benson wire was  advanced into the heart under fluoroscopic visualization.  Dilators were  used to dilate the subcutaneous tract.  I placed an Amplatz wire as the  patient has a think neck and the [facility] wire was not stiff enough.  Ultimately, the peel-away sheath was placed.  The catheter portion of  the HeRO device was advanced through the peel-away sheath under  fluoroscopic visualization.  The dilator was removed, and a clamp was  placed on the catheter and the peel-away  sheath was removed.  Next,  attention was turned towards the arm.  An incision was made in the  bicipital groove in the upper arm.  The brachial artery was exposed  through this incision and mobilized proximally and distally.  The artery  was approximately 3 mm.  Once I had good exposure of the artery, a  counterincision was made in the deltopectoral groove.  A #10 blade was  used to make an incision, and a long straight tunnel was used to connect  the arm incision to the clavipectoral groove.  Next, Tresa Endo was used to  create a subcutaneous tunnel and bring the catheter portion into the  clavipectoral site incision.  At this point in time, the patient was  given systemic heparinization.  Next, the PTFE portion of the device was  brought through the previously created tunnel.  Fluoroscopy was used to  ensure the catheter tip was in the right atrium.  The graft and catheter  were then mated.  Next, the artery was occluded with vascular clamps,  and a #11 blade was used  to make an arteriotomy which was extended with  Potts scissors.  The end of the graft was pulled to the appropriate  length and then spatulated.  A running anastomosis with 6-0 Prolene was  done.  Prior to completion of the anastomosis, the artery was flushed in  antegrade and retrograde fashion.  The anastomosis was then secured and  the clamps were released.  There was a palpable thrill within the graft.  The patient had Doppler signal in her radial and ulnar arteries after  the case.  Next, the wounds were irrigated.  The deep tissues were  closed with a 3-0 Vicryl.  Skin incision in the neck  was closed with a 4-0 Vicryl, and the deltopectoral groove was closed  with a 4-0 Vicryl and in the arm the incision was closed with 3-0 nylon.  The patient was awakened from anesthesia and taken to recovery room in  stable condition.  There were no complications.            ______________________________  V. Charlena Cross,  MD  Electronically Signed     VWB/MEDQ  D:  07/12/2008  T:  07/13/2008  Job:  [id number]   cc:   Terrial Rhodes, M.D.

## 2010-09-22 NOTE — Consult Note (Signed)
[name], [name]                ACCOUNT NO.:  [id number]   MEDICAL RECORD NO.:  [id number]          PATIENT TYPE:  INP   LOCATION:  2040                         FACILITY:  [facility]   PHYSICIAN:  Ines Bloomer, M.D. DATE OF BIRTH:  Jun 23, 1932   DATE OF CONSULTATION:  DATE OF DISCHARGE:                                 CONSULTATION   CHIEF COMPLAINT:  Fever, shortness of breath.   HISTORY OF PRESENT ILLNESS:  This 77 year old African-American female  presented on November 07, 2006 with a cough and chest congestion, she was  febrile and weakness.  She had been hospitalized previously for gout and  received prednisone.  She also has diabetes type 2, morbid obesity,  hypertension, chronic renal insufficiency, gout, anemia, secondary  hyperparathyroidism.  She also has COPD.   She is on multiple medications including insulin, Vancomycin,  metoprolol, Deseryl, guaifenesin, Zemplar, Colace, ferrous sulfate,  colchicine, Catapres, Norvasc, Diltiazem, Flagyl, Ventolin, Tensilon,  NovoLog, Senokot, Zofran.   She has no allergies.   SOCIAL HISTORY:  She is a former smoker, does not drink alcohol on a  regular basis.   FAMILY HISTORY:  Noncontributory.   REVIEW OF SYSTEMS:  CARDIAC:  No angina or atrial fibrillation.  PULMONARY:  No hemoptysis, but fever, chills.  GI:  Poor appetite, some  nausea, vomiting.  GU:  See History of Present Illness.  MUSCULOSKELETAL:  Chronic arthritis, bilateral skin rash.  PSYCHIATRIC:  History of depression.  ENT:  No change in her eyesight or hearing.  HEMATOLOGICAL:  Iron deficiency anemia.   Creatinine is 3.92, BUN 59, BNP 475.   VITAL SIGNS:  Blood pressure is 120/80, pulse 80, respirations 18, sats  were 90%, temperature was 100.  HEENT:  Unremarkable.  NECK:  Supple without thyromegaly.  CHEST:  Decreased breath sounds on the left side.  HEART:  Regular sinus rhythm.  No murmurs.  ABDOMEN:  Obese.  Bowel sounds are normal.  There is no  tenderness.  EXTREMITIES:  There is 1+ pulses, 2+ edema.  There is marked excoriation  of the right and left arms.  NEUROLOGICAL:  She is oriented x3.  Sensory and motor intact.   IMPRESSION:  1. Empyema with elevated white count.  2. Left lower lobe pneumonia.  3. Chronic renal insufficiency.  4. Hypertension.  5. Diabetes mellitus type 2.  6. Morbid obesity.   RECOMMENDATIONS:  Pigtail drainage left lower lobe lesion, as she does  have positive Methicillin-resistant staph aureus.   Dictation ended at this point.      Ines Bloomer, M.D.  Electronically Signed     DPB/MEDQ  D:  11/14/2006  T:  11/14/2006  Job:  [id number]

## 2010-09-22 NOTE — Discharge Summary (Signed)
[name], SHEAR                ACCOUNT NO.:  [id number]   MEDICAL RECORD NO.:  [id number]          PATIENT TYPE:  INP   LOCATION:  5526                         FACILITY:  [facility]   PHYSICIAN:  Hillery Aldo, M.D.   DATE OF BIRTH:  1933-01-22   DATE OF ADMISSION:  12/16/2006  DATE OF DISCHARGE:                               DISCHARGE SUMMARY   INTERIM DISCHARGE SUMMARY:   DATE OF DISCHARGE:  Pending.   PRIMARY CARE PHYSICIAN:  Maxwell Caul, M.D.   For complete list of the Discharge Diagnoses, Consultants, History of  Present Illness, Procedures and Diagnostic Studies, and Hospital Course  through December 27, 2006, please see the previously dictated Discharge  Summary done by Dr. [name] and Dr. Ashley Royalty.  This interim summary  will cover the period of time December 28, 2006, through January 03, 2007.   ADDITIONAL DISCHARGE DIAGNOSES:  1. Persistent leukocytosis, thought to be secondary to toxin-negative      Clostridium difficile colitis.  2. Labile diabetes mellitus.  3. Acute renal failure in the setting of stage IV chronic kidney      disease, hemodialysis started  4. Anemia of chronic disease plus gastrointestinal losses, outpatient      gastrointestinal evaluation recommended if indicated.  5. Secondary hyperparathyroidism.  6. Depression.   DISCHARGE MEDICATIONS:  Will be dictated time of actual discharge.   ADDITIONAL CONSULTATIONS:  Antonietta Breach, M.D., of psychiatry.   PROCEDURES AND DIAGNOSTIC STUDIES December 27, 2006 THROUGH December 29, 2006:  1. Tunneled hemodialysis catheter placed by ultrasound  on December 27, 2006.  2. Chest x-ray on December 29, 2006, showed marked improvement in      pulmonary edema, mild bilateral atelectasis.  3. CT scan of the chest on December 30, 2006, showed a small amount of      residual fluid in the posterolateral lung base with some adjacent      collapse and consolidation within the left lower lobe.  There was  scattered patchy and linear densities in both lungs, relatively      stable.  Stable 6 mm nodule in the right upper lobe.  Other nodules      have decreased or resolved in the interval.  These nodules thought      to be related to infection or inflammation.  4. CT scan of the abdomen on December 30, 2006, showed cholelithiasis      without evidence of cholecystitis.  5. CT scan of the pelvis on December 30, 2006, showed no acute findings.      There were focal sclerotic lesions in T10 vertebral body and left      sacrum, likely due to bone island, but osteoblastic metastatic      disease could also have this appearance.   DISCHARGE LABORATORY VALUES:  To be dictated the time of actual  discharge.   HOSPITAL COURSE December 27, 2006, TO January 03, 2007:  #1.  PERSISTENT LEUKOCYTOSIS:  The patient had a persistently elevated  white blood cell count that reached a high of 25.5.  Due to  this, a  septic workup was undertaken.  Blood cultures were obtained which are  negative at this time.  Urine cultures grew out only 20,000 colonies of  yeast.  C-difficile toxin studies were negative.  CT scanning of the  chest, abdomen and pelvis was unrevealing.  Given her persistent  leukocytosis as well as diarrhea, the patient was started empirically on  Flagyl, and her white blood cell count has begun to resolve.  At this  time, she is awaiting an indium scan and, if this is negative and her  white blood cell count does not normalize, consideration can be made for  consulting hematology for bone marrow biopsy.   #2.  ACUTE RENAL FAILURE IN THE SETTING OF STAGE IV CHRONIC KIDNEY  DISEASE:  Patient is likely now hemodialysis dependent.  She has been  receiving dialysis here in the hospital and will need outpatient  hemodialysis three times a week.   #3.  DIABETES MELLITUS:  The patient's glucose has been somewhat labile.  We are adjusting her insulin p.r.n. but avoiding overly tight control  due to her  penchant for hypoglycemic episodes.   #4.  DERMATITIS:  The patient's dermatitis was thought to be secondary  to an allergic reaction to VANCOMYCIN.  She was seen by a dermatologist  who made recommendations which we will continue to follow.  Her  dermatitis is resolving.   #5.  HYPERTENSION:  The patient's blood pressure has been adequately  controlled and her blood pressures medicines titrated to effect.   #6.  ANEMIA OF CHRONIC DISEASE IN THE SETTING OF HEME-POSITIVE STOOLS:  The patient has been started on Aranesp.  She has received blood  transfusions while in the hospital.  Consideration can be made for an  outpatient GI evaluation if indicated.   #7.  SECONDARY HYPERPARATHYROIDISM:  The patient was put on calcium  supplementation.   #8.  DEPRESSION:  The patient was seen in consultation with Dr.  [name].  She was started on Celexa which was briefly interrupted  while the patient was on Zyvox.  She is now back on Celexa, and her mood  is stable.   DISPOSITION:  The patient is expected to be discharged to a skilled  nursing facility when she is medically stable for transfer.  It is felt  that she will be able to tolerate outpatient hemodialysis given her  current functional status.  Once her white blood cell count normalizes  and she has been fully worked up with regard to this issue, she will be  stable for discharge.  The Discharge Summary Addendum will be dictated  at that time.      Hillery Aldo, M.D.  Electronically Signed     CR/MEDQ  D:  01/03/2007  T:  01/03/2007  Job:  [id number]   cc:   Maxwell Caul, M.D.

## 2010-09-22 NOTE — Assessment & Plan Note (Signed)
OFFICE VISIT   [name], [name]  DOB:  [birth year]                                       08/05/2008  [id number]#:[id number]   This is a 77 year old old female who is status post HeRo catheter placed  on July 12, 2008.  She also had to have her right Diatek placed on March  11.  She developed swelling at her arterial anastomosis site from her  HeRo catheter.  Her HeRo device ultimately thrombosed.  Of note, the  patient has had several accesses in the past that failed prior to being  used.  She has had a hypercoagulable workup sent; however, this came  back negative.  Patient is now several weeks status post her HeRo  catheter.  I have discussed with the company on our options of  thrombolysis versus operative thrombectomy.  I think at this time it is  best to proceed with operative thrombectomy and possibly replacing the  [facility] catheter altogether.  I have discussed this with the patient.  I am  scheduling her for this to be done April 9.  We will have to coordinate  rescheduling her dialysis date to make this happen.  I also plan on  placing her on low-dose Coumadin in attempt to keep her graft open.   Jorge Ny, MD  Electronically Signed   VWB/MEDQ  D:  08/05/2008  T:  08/06/2008  Job:  [id number]   cc:   Terrial Rhodes, M.D.

## 2010-09-22 NOTE — Discharge Summary (Signed)
NAMESHANYIAH, CONDE NO.:  [id number]   MEDICAL RECORD NO.:  [id number]          PATIENT TYPE:  INP   LOCATION:  5526                         FACILITY:  [facility]   PHYSICIAN:  Lonia Blood, M.D.       DATE OF BIRTH:  [birth year]   DATE OF ADMISSION:  12/16/2006  DATE OF DISCHARGE:  01/05/2007                               DISCHARGE SUMMARY   The patient's primary care physician is Dr. Baltazar Najjar, but this  patient will become hemodialysis dependent, so the primary care  physician will become by [facility].   DISCHARGE DIAGNOSES:  Refer to previously dictated discharge summaries.   DISCHARGE MEDICATIONS:  1. Protonix 40 mg by mouth daily.  2. Colace 100 mg by mouth twice a day.  3. Eucerin cream twice a day.  4. Allopurinol 100 mg by mouth daily.  5. Calcium carbonate 500 mg by mouth twice a day.  6. Aspirin 81 mg daily.  7. Epoetin with hemodialysis.  8. Lantus 35 units at bedtime.  9. Celexa 10 mg daily.  10.Metoprolol 50 mg twice a day.  11.Nepro 1 can twice a day.  12.Hydrocodone 5/500 by mouth as needed for pain.   CONDITION ON DISCHARGE:  Ms. Wolff was discharged in fair condition.  The patient is basically bed bound. She also is requiring full  assistance at this point in time. She will be transferred to skilled  nursing facility via an ambulance. The patient has her hemodialysis as  an outpatient arranged. The patient will need an air mattress at the  skilled nursing facility, and she will need specialized wound care. For  complete list of procedures and consultations, refer to the previously  dictated discharge summary. The last procedure that was done was on  January 03, 2007, the white blood cell scan, which was negative for any  infectious or inflammatory process.   HOSPITAL COURSE:  This dictation will cover the hospital course from  August 27 until January 05, 2007. During this period of time, Ms. Doleman  have remained  stable. She did not have any further complications. The  patient's leukocytosis has started to improve, and her discharge white  blood cell count is 15,000. If desire is to further pursue the  leukocytosis, one can refer Ms. Betton to an outpatient hematology  consult. The patient will need to pursue  physical therapy and occupational therapy at the skilled nursing  facility. The patient's diet will have to be monitored very closely as  to assure the patient will not become hypoglycemic if she stops eating.  The patient is going to be set up for hemodialysis at [facility] with dialysis schedule being Tuesday, Thursday and Saturday.      Lonia Blood, M.D.  Electronically Signed     SL/MEDQ  D:  01/05/2007  T:  01/05/2007  Job:  [id number]   cc:   [facility] Iline Oven, M.D.

## 2010-09-22 NOTE — Discharge Summary (Signed)
NAMEGISELA, [name] [name].:  [id number]   MEDICAL RECORD [name].:  [id number]          PATIENT TYPE:  INP   LOCATION:  6733                         FACILITY:  [facility]   PHYSICIAN:  Ellie Lunch, M.D.      DATE OF BIRTH:  Jan 28, 1933   DATE OF ADMISSION:  10/12/2006  DATE OF DISCHARGE:  10/19/2006                               DISCHARGE SUMMARY   PRIMARY CARE PHYSICIAN:  Jasmine December A. Paulino Rily, MD, at [facility]  at [place].   DISCHARGE DIAGNOSIS:  1. Polyarticular pain and swelling most likely secondary to gout.  2. Gout (note joint aspirate done did not show any evidence of septic      arthritis; unfortunately, crystal analysis was not performed to      confirm gout).  3. Leukocytosis, most likely secondary to gout and then secondary to      prednisone use.  4. Hyperglycemia in a patient with diabetes, most likely secondary to      corticosteroids.   PAST MEDICAL HISTORY:  1. Stage IV chronic renal disease with a baseline creatinine of 3.5,      being managed by Dr. Kathrene Bongo.  2. Hypertension.  3. Type 2 diabetes mellitus with hemoglobin A1c of 6.4.  4. Anemia of chronic disease.  5. Secondary hyperparathyroidism secondary to chronic renal disease.   DISCHARGE MEDICATIONS:  1. Prednisone 40 mg p.o. daily until June 12, then 30 mg p.o. daily      from June 13 to June 15, then 20 mg p.o. daily from June 16 to the      18th, then 15 mg p.o. daily from June 19 to the 21st, then 10 mg      p.o. daily from June 22 to the 24th, and then 5 mg of prednisone      p.o. q.a.m. after that.  2. Allopurinol 100 mg every other day, to be started on October 24, 2006,      and not prior to that.  3. Colchicine 0.6 mg p.o. daily.  4. Norvasc 10 mg daily.  5. Catapres 0.3 mg p.o. b.i.d.  6. Diltiazem 240 mg p.o. daily.  7. Iron sulfate 325 mg p.o. b.i.d.  8. Colace 100 mg p.o. q.h.s.  9. Lasix 40 mg p.o. b.i.d.  10.Glucotrol 10 mg p.o. b.i.d.  11.Lopressor 100 mg  p.o. b.i.d.  12.Lantus 22 units q.h.s. (please titrate according to the patient's      blood sugars, which will come down as her dose of prednisone is      titrated down).  13.Zemplar 1 mcg p.o. daily.  14.Actos 30 mg p.o. daily.  15.Acetaminophen 325 mg p.o. q.6h. p.r.n. pain.  16.Sliding-scale insulin, insulin-sensitive, with CBGs q.a.c. and      q.h.s., with [name] bedtime coverage.   DISPOSITION AND FOLLOW-UP:  The patient is currently being discharged to  a skilled nursing facility for short-term rehab, after which she will be  able to go home.  She will also be followed up by Dr. Kellie Simmering in his  office on November 22, 2006, at 10:45 a.m.  She also  has an appointment with  Dr. Kathrene Bongo scheduled on November 30, 2006 at 2:30 pm.  Please note  that the patient's blood sugars will decrease as her prednisone dose  decreases and thus her Lantus dose may need to be titrated down based on  her blood sugars, and this can be done at the nursing home.  Also, the  patient will get physical therapy at the nursing home based on the  physical therapy recommendations in the hospital.  The patient will need  a follow-up on her CBC and her BMET met in about 1 week after discharge  to follow up on her white count, which was elevated at 20,000, and her  creatinine, which  was elevated at 4.1 on the day prior to discharge.   PROCEDURES:  A right knee effusion was aspirated on October 12, 2006, under  fluoroscopic guidance; however, fluid was not sent for crystal analysis  but it was sent for culture and did not show any growth.   ADMISSION HISTORY AND PHYSICAL:  [name] is very pleasant 77 year old  African American female with a history as notable above that basically  came into the hospital because she because she complained of left knee  pain and swelling as well as left shoulder pain and pain in her wrist  joints as well.  She also had associated weakness on the left side  because of the pain as well.   She denied any fevers or chills.  Apart  from the swelling in the knees, she denied swelling in other joints.  She denied any recent history of night sweats or weight loss.  She says  she was told she had gout about 2 years ago when her PCP empirically  treated her gout of her left knee.  At that time [name] fluid analysis was  done to look for crystals.   HOSPITAL COURSE:  Problem 1.  POLYARTICULAR PAIN AND SWELLING, MOST LIKELY SECONDARY TO  GOUT:  Note, given the patient's findings she was admitted mainly for  septic arthritis with a white count of 22 with a left shift.  A left  knee joint was aspirated under fluoroscopic guidance.  Unfortunately, it  was not sent for crystal analysis; however, cultures of the aspirate  were negative for organisms. An attempt was made to get crystal analysis  5 days after the aspiration but the fluid was already discarded. Note,  an ESR was obtained, which was elevated at 110 .  ANA and rheumatoid  factors were also obtained, which were negative.  A uric acid level was  done, which was also elevated at 10.7.  Dr., who is a rheumatologist,  was curbside-consulted and he recommended placing the patient on  colchicine once a day and to start on allopurinol on October 24, 2006, and  recommended a gradual taper of prednisone.  He will see her in the  office on November 22, 2006, at 10:45 for further evaluation.  Note, the  patient had an 80% improvement in all of her symptoms on the day prior  to discharge when this is being dictated.   Problem 2.  LEUKOCYTOSIS:  Note, the patient had an elevated white count  of 22,000 with a left shift.  Initially it was thought to be secondary  to septic arthritis.  She was given vancomycin and Zosyn to cover septic  arthritis; however, once aspirate cultures were negative these were  discontinued.  She continued to have elevated white count and this was then  thought to be secondary to corticosteroids because of absence of  any  clinical signs and symptoms of infection.  A urinalysis done, which  was negative for a UTI, chest x-ray was negative for pneumonia, and  blood cultures were negative as well.   Problem 3.  HYPERGLYCEMIA WITH TYPE 2 DIABETES MELLITUS:  The patient's  hemoglobin A1c was 6.4.  However, her sugars were markedly elevated,  most likely secondary to prednisone.  Her Actos was increased from 15 mg  to 30 mg.  She was also started on Lantus, the dose of which was  gradually titrated upwards.  Note, the patient will need regular  monitoring of her CBCs since her prednisone dose is being titrated down  to make sure she does not have hypoglycemia, since her insulin  requirements will come down as her prednisone comes down.  This can be  followed up at the skilled nursing facility.   Problem 4.  ANEMIA OF CHRONIC DISEASE:  The patient was placed on  erythropoietin protocol in the hospital and her iron was continued.  Her  hemoglobin was at 9.1 on the day prior to discharge.   Problem 5.  HYPERTENSION:  Blood pressure was well-controlled on her  blood pressure medications.  Lasix, however, was started about 2 days  prior to admission, which led to a creatinine bump from 3.5 to 4.1 and  nephrology was curbside-consulted, and they said that this is not  surprising since lowering the blood pressure will lower renal perfusion  and thereby increase creatinine.  I obtained records from Dr.  Jon Gills office and her creatinine at baseline is 3.5 and she does  have stage IV kidney disease.  This can further be monitored as an  outpatient, and I have called Dr. Jon Gills office to schedule an  appointment for the patient to be followed up with her in about a month  for her stage IV chronic renal disease.   Problem 6.  LEFT-SIDED WEAKNESS:  Note, the patient is currently being  discharged to a short-term skilled nursing facility where she can obtain  rehab.  A [facility] inpatient rehab consult was  obtained; however, the  patient's insurance denied [facility] inpatient rehab and thus she is being  discharged to skilled nursing facility.      Ellie Lunch, M.D.  Electronically Signed     BP/MEDQ  D:  10/18/2006  T:  10/19/2006  Job:  [id number]   cc:   Emeterio Reeve, MD  Aundra Dubin, M.D.

## 2010-09-22 NOTE — Assessment & Plan Note (Signed)
OFFICE VISIT   PAULETT, KAUFHOLD  DOB:  07/20/1932                                       07/05/2007  [id number]#:[id number]   Ms. Bubel returns for followup today for consideration of placement of  a thigh graft.  She was last seen on February 11th.  At that time she  had noninvasive arterial Doppler studies done which showed calcified  vessels bilaterally with monophasic flow to the right DP and posterior  tib.  She had biphasic flow to the left DP and posterior tib.  Pressure  was 62 on the right and 54 on the left.   Of note, she also has an ulceration on her right first toe currently.  I  had a lengthy discussion with Ms. Ryle again today and she does not  want to have a thigh graft placed at this time as she has concerns about  possible lower extremity ischemia or even limb loss.  She currently is  dialyzing via right-sided catheter.  On inspection of the catheter,  there is no obvious evidence of infection currently.  I did discuss with  her the options of a thigh graft and possible ischemia vs. a catheter  and possible infection.  For now she prefers to use her catheter.  She  will return for followup on an as-needed basis.   [name]. Fields, MD  Electronically Signed   CEF/MEDQ  D:  07/05/2007  T:  07/07/2007  Job:  [id number]

## 2010-09-22 NOTE — Discharge Summary (Signed)
NAMETIMICA, MARCOM                ACCOUNT NO.:  [id number]   MEDICAL RECORD NO.:  [id number]          PATIENT TYPE:  INP   LOCATION:  2040                         FACILITY:  [facility]   PHYSICIAN:  Kela Millin, M.D.DATE OF BIRTH:  05-Mar-1933   DATE OF ADMISSION:  11/07/2006  DATE OF DISCHARGE:  11/25/2006                               DISCHARGE SUMMARY   CONTINUATION OF D/C SUMMARY:   BRIEF HISTORY:  The patient is a 77 year old black female with above  listed medical problems who presented with complaints of shortness of  breath and weakness.  She also reported a cough productive of yellowish  sputum and chest congestion.  She admitted to chills and night sweats.  She was noted to be febrile in the ER with a temperature of 101.6.  It  was noted that the patient had been hospitalized earlier in the month  and had been on a course of prednisone as well.  She is diabetic and her  blood sugars had been reported to be labile at the nursing home.   Please see the admission H&P for the admission physical exam and  laboratory data.   HOSPITAL COURSE:  1. MRSA pneumonia-upon admission blood cultures were obtained and the      patient started on broad-spectrum antibiotics of Zosyn and      Vancomycin.  On her fifth hospital day she became more short of      breath and was transferred to the ICU for close monitoring.  Work-      up included a repeat chest x-ray which showed new air space      pneumonia in the left upper lobe, stable dense left lower lobe      atelectasis, and/or pneumonia and linear atelectasis in the      lingula, right middle lobe and right upper lobe.  The patient was      also empirically diuresed with IV Lasix and a brain natriuretic      peptide was done and it was 475.  The patient's sputum culture grew      MRSA and as noted the patient was already on Vancomycin and so this      was continued.  The patient also had a CT scan of her chest to      further evaluate  and the results are as stated above with empyema.      CVTS was consulted and Dr. Edwyna Shell saw the patient and recommended a      chest tube which was placed by interventional radiology under CT      guidance.  Dr. Edwyna Shell followed the patient and followed the output      from the drain and on November 20, 2006 it was noted that there was no      drainage and the pigtail was discontinued.  Infectious disease was      consulted to see the patient for further antibiotic recommendations      and followed the patient during her hospital stay.  The patient had      developed diarrhea in the hospital  and a C-diff was ordered and the      patient empirically started on Flagyl.  The C-diff was negative.      Infectious disease subsequently discontinued the Flagyl and the      patient has been on Vancomycin.  A repeat chest CT done on November 19, 2006 showed a significant decrease in the size of the air and fluid      collection.  Dr. Selina Cooley Disease final recommendations on      November 22, 2006 were that the patient would need to receive IV      Vancomycin for 19 more days and this has been dosed q.48h. (10 more      doses from November 22, 2006).  The patient has remained afebrile and      hemodynamically stable.  Her last white cell count prior to      discharge was 12.2.  She is to follow up with the nursing home      physician upon discharge and infectious disease, Dr. Darlina Sicilian, as      needed.  The Vancomycin levels are to be monitored as appropriate      per pharmacist and MD.  2. Empyema-as discussed above.  3. Uncontrolled diabetes mellitus-because of the patient's labile      blood sugars her medications were adjusted.  The Glucotrol and      Actos were discontinued in the hospital and the patient maintained      on Lantus, the dose adjusted as appropriate for adequate blood      glucose control and she was also covered with sliding scale      insulin.  The patient will be discharged on  Lantus 28 units q.h.s.      and she is to continue on the sliding scale as well.  Her blood      sugars are to be monitored and oral hypoglycemics reconsidered per      nursing home physician/PCP when clinically appropriate.  4. Hypertension-the patient was maintained on her Tiazac and Catapres      during her hospital stay as well as Metoprolol and she is to      continue this upon discharge.  5. Chronic renal insufficiency-as above her creatinine prior to      discharge is 2.72 which is improved from 3.58 on admission.  The      patient is to follow up with her nephrologist, Dr. Kathrene Bongo.  6. Peripheral edema-the patient was noted to have +2 edema upon      admission and subsequently while in the hospital she did complain      that the right leg was more swollen than the left and lower      extremity Doppler ultrasound was done.  This was negative for DVT.      The patient was maintained on Lasix during her hospital stay and is      to continue this upon discharge.   DISCHARGE MEDICATIONS:  1. Vancomycin 1 gram IV q.48h. through December 11, 2006, pharmacy to      follow.  2. Lantus 28 units subcu q.h.s.  3. Allopurinol 100 mg p.o. daily.  4. Norvasc 10 mg p.o. daily.  5. Tessalon Perles 100 mg p.o. b.i.d. for cough.  6. Clonidine 0.3 mg p.o. b.i.d.  7. Colchicine 0.6 mg p.o. daily.  8. Diltiazem 240 mg p.o. daily.  9. Colace 100 mg p.o. b.i.d., hold if diarrhea.  10.Iron  sulfate 325 mg p.o. b.i.d.  11.Lasix 40 mg p.o. daily.  12.Mucinex 600 mg p.o. b.i.d. for cough.  13.Insulin, NovoLog sliding scale as previously.  14.Albuterol 2.5 mg nebulizers q.6h.  15.Metoprolol 800 mg p.o. b.i.d.  16.Zemplar 1 mcg p.o. daily.  17.Trazodone 50 mg p.o. q.h.s.  18.Tylenol 650 mg p.o. q.4-6h. p.r.n.  19.Senokot one p.o. q.h.s. p.r.n. constipation.  20.Tiazac 240 mg p.o. daily.  21.The patient to continue nasal cannula oxygen two liters.   DISCHARGE CONDITION:  Improved-stable.    FOLLOW UP CARE:  1. Nursing home physician in 1-2 days.  2. Dr. Darlina Sicilian as needed.  3. Dr. Mila Palmer as scheduled.      Kela Millin, M.D.  Electronically Signed     ACV/MEDQ  D:  11/25/2006  T:  11/25/2006  Job:  [id number]   cc:   Emeterio Reeve, MD  Fransisco Hertz, M.D.

## 2010-09-22 NOTE — Assessment & Plan Note (Signed)
OFFICE VISIT   [name], PAGE  DOB:  April 20, 1933                                       06/21/2007  [id number]#:[id number]   The patient returns today for followup and evaluation for placement of  new hemodialysis access.  She has had a left forearm, left upper arm,  and right upper arm grafts, all of which have never lasted long enough  to cannulate the first time.  She presents today for consideration and  placement of a thigh graft.  Of note, she did have hypercoagulable labs  sent off, which were negative for any type of hypercoagulable state.  Her primary risk factor for graft thrombosis is obesity.   EXAM:  Blood pressure is 114/70, heart rate is 85 and regular.  In her  right foot, she has a small area of ulceration in her right first toe  cuticle, which she states apparently had some purulent drainage.  She is  set to have Dr. Leeanne Deed evaluate this in 2 days.  Otherwise, the feet  are warm and adequately perfused.  She has 2+ femoral pulses, but absent  popliteal and pedal pulses bilaterally.  She had arterial Doppler study  performed today, which showed calcified vessels bilaterally.  She had  monophasic Doppler flow in the right dorsalis pedis and posterior tibial  artery.  She had biphasic Doppler flow in the left dorsalis pedis and  posterior tibial artery.  Digital pressures were decreased bilaterally.   The patient's next access option would be a thigh graft.  However, she  does have evidence of tibial disease bilaterally.  She would be at risk  of digit or limb loss if she developed significant steal.  I discussed  this with the patient at length today.  She is going to consider this  option for a couple of weeks and then follow up with me in 2 weeks'  time.  She will also see Dr. Leeanne Deed to make sure the infection in her  toe is completely cleared up prior to considering an AV graft.   [name]. Fields, MD  Electronically Signed   CEF/MEDQ   D:  06/21/2007  T:  06/23/2007  Job:  [id number]

## 2010-09-22 NOTE — Consult Note (Signed)
NAMESRINIDHI, LANDERS                ACCOUNT NO.:  [id number]   MEDICAL RECORD NO.:  [id number]          PATIENT TYPE:  INP   LOCATION:  2310                         FACILITY:  [facility]   PHYSICIAN:  Maree Krabbe, M.D.DATE OF BIRTH:  11/20/1932   DATE OF CONSULTATION:  12/17/2006  DATE OF DISCHARGE:                                 CONSULTATION   RENAL CONSULTATION:   REASON FOR CONSULTATION:  Worsening rash and edema and acute on chronic  renal failure.   HISTORY OF PRESENT ILLNESS:  The patient is a 77 year old female with  diabetes and chronic kidney disease and hypertension and chronic lower  extremity edema with a recent admission for MRSA pneumonia with  complicating empyema who, for 1 to 2 weeks, has been complaining of  worsening edema, and has had a worsening rash for at least 4 to 5 days.  The rash is described as diffuse involving the entire body, itchy and  erythematous in character.  She presented to the emergency room on  August 6 and was diagnosed with a UTI and started on Cipro.  Of note is  that following her hospitalization for MRSA pneumonia she was treated  with vancomycin for a total of 19 days following her discharge and was  essentially treated from July 1 until August 4.   PAST MEDICAL HISTORY:  Includes:  1. MRSA pneumonia with complicating empyema.  Discharged November 25, 2006      on Vanc until 12/12/06.  2. Chronic renal insufficiency followed by Dr. Kathrene Bongo.  Old      records have not been obtained at this point but at the last      hospitalization she presented with a creatinine of 3.58 and was      discharged with a creatinine of 2.72.  3. Diabetes mellitus on insulin therapy.  4. Hypertension.  5. Chronic peripheral edema.  6. Recent diagnosis of urinary tract infection.  Urine culture      pending.  7. History of prior diffuse rash similar to current rash but not as      severe attributed to a medication, though she cannot remember which  one, believing it was an antibiotic.  Again that resolved on its      own.  8. History of yeast infections following antibiotic therapy.  9. Anemia, specifically iron deficiency.  10.Secondary hyperparathyroidism secondary to chronic kidney disease.  11.Morbid obesity.   MEDICATIONS:  1. Allopurinol 100 mg once a day.  2. Norvasc 10 mg once a day.  3. Colchicine 0.6 mg once a day.  4. Cardizem CD 240 mg once a day.  5. Zemplar 0.1 mcg once a day.  6. Tessalon Perles 100 mg twice a day.  7. Clonidine 0.3 mg twice daily.  8. Colace 100 mg twice a day.  9. Iron sulfate 325 mg twice a day.  10.Lopressor 100 mg twice a day.  11.Trazodone 50 mg once at night.  12.She is on an insulin sliding scale.  13.Lasix 80 mg once a day.  14.Phenergan 25 mg IM as needed for nausea or vomiting.  15.Tylenol  650 mg q.4 h. as needed for pain or fever.  16.Senokot 1 tab at night as needed for constipation.  17.Albuterol 0.083 per nebulizer q.6 h. as needed.  18.Eucerin cream applied entire body daily as needed.  19.Ciprofloxacin started on August 6 250 mg once daily for 7 days   Note that vancomycin once every other day was administered to the  patient between July 14 and August 4.  Of note the patient was  discharged on July 18 on Lantus 28 units.  The patient did not know for  sure if she was still on that medication and according to the  hospitalist note she was not on Lantus and the nursing home could not be  reached at the time of the consult because of the late hour.   SOCIAL HISTORY:  She has a distant history of smoking but no alcohol  use.  She currently resides at [facility], and she has a very  supportive family who is with her during this hospitalization.   FAMILY HISTORY:  Mother deceased, CVA; father deceased, cancer.   REVIEW OF SYSTEMS:  Positive for itching, generalized edema.  Some  decreased frequency and amount of urination.   PHYSICAL EXAMINATION:  VITAL SIGNS:   Temperature 97.1, pulse 72,  respiratory rate 24, blood pressure 112/50.  She was on 2 liters nasal  cannula.  GENERAL APPEARANCE:  A 77 year old morbidly obese female.  Itchy but  pleasant and in no apparent distress.  Oral mucosa was moist and  otherwise unremarkable.  She was edentulous.  NECK:  Supple.  No lymphadenopathy.  PULMONARY:  Somewhat decreased air movement with fairly shallow  breathing which she says is her baseline.  She was breathing without  difficulty.  No crackles were appreciated.  No rhonchi as well.  CARDIOVASCULAR:  S1, S2.  No murmur.  Regular rate and rhythm.  SKIN:  Notable for a diffuse, erythematous rash encompassing her entire  body except her face with multiple excoriation marks.  ABDOMEN:  Soft, morbidly obese abdomen.  No tenderness, rebound, or  guarding.  Of note, she had a stage 1 sacral ulcer.  EXTREMITIES:  She had bilateral 2+ pitting edema on her lower legs which  the family says is somewhat near her baseline.  She also had swollen  upper extremities as well but without pitting.  She had no joint  deformity.  No CVA tenderness.  She is alert and oriented x3 and  pleasant with an appropriate affect.   LABORATORY:  From August 6 included a hemoglobin of 10.2, hematocrit 32,  white blood cell count 20 with a left shift of 18, platelets 409.  Sodium 135, potassium 4.5, chloride 113, bicarbonate 15, BUN 5,  creatinine 3.9, glucose 73, calcium 8.2.  She had a UA with large LE, 50  ketones, moderate blood, 3 to 6 RBCs, WBCs that were too numerous to  count and many bacteria.  On August 8, labs were drawn at the nursing  home showing a BUN of 56, a creatinine elevated at 5.3, potassium up to  5.1 with a sodium of 141, chloride 111, calcium 8.1, and glucose of 86.   ASSESSMENT AND PLAN:  1. Diffuse erythematous rash.  This appears like a drug reaction with      the possible culprit being vancomycin.  Ciprofloxacin is less      likely as the rash seems  to have begun before the Cipro was      initiated.  However,  history is somewhat spotty.  With that being      said, she did present with a rash before she was started on Cipro.      We are relieved that there is no involvement of her oral mucosa but      her rash is quite diffuse.  However, she is off the supposed      offending agent.  We are hoping that she will get better with      supportive care.  However, it is possible that she is third spacing      so we are treating her supportively with D5W, 2 amps of      bicarbonate, Eucerin as needed.  We are going to hold      antihistamines as I would be concerned about sedating this patient      who likely has obesity hypoventilation syndrome, and if the      condition does not resolve off of vancomycin with supportive care,      we might need to pursue a more in-depth workup.  HUS is less likely      because of characteristics of the rash as is disseminated      intravascular coagulopathy.  It seems too early to be an      interstitial nephritis or an allergic nephritis as the cause as      well, especially with no evidence of eosinophilia though urine      eosinophils are pending.  2. Acute renal failure and chronic renal insufficiency.  I think this      is most likely secondary to her third spacing making her      intravascularly depleted, which along with the lasix she was      receiving, caused the increase in her creatinine.  Will provide      gentle hydration and D5W with 2 amps bicarbonate at 60 mL per hour      on the chance that she may be volume overloaded.  However, her      chest x-ray does not look changed from previous chest x-rays.  She      does not seem to be volume overloaded at the current time.  3. Acidosis.  Will check an arterial blood gas.  Add bicarbonate to      the intravenous fluids as above.  Monitor her labs.  We think this      is likely secondary to issues number 1 and 2 but will monitor      closely.   With that being said, I would expect her bicarbonate to      be higher considering that she likely has chronic respiratory      acidosis, likely a chronic CO2 retainer because of her morbid      obesity.  Again this is something that we will need to keep an eye      on.  4. Urinary tract infection.  I will still hold antibiotics for now.      She did not come in with complaints of urinary symptoms.  In the      future with the current condition resolved, I would treat      cautiously.  5. Obesity hypoventilation syndrome.  Give her albuterol as needed.  6. Diabetes.  She has sliding scale insulin.  Defer to primary team.  7. Hypertension.  Defer to primary team.   DISPOSITION:  I am hoping that this rash is a drug reaction that  will  resolve off of vancomycin.  I would be hesitant to initiate vancomycin  in the future on this rash and would really take the clinical picture  into account.      Valetta Close, M.D.  Electronically Signed      Maree Krabbe, M.D.  Electronically Signed    JC/MEDQ  D:  12/17/2006  T:  12/17/2006  Job:  [id number]

## 2010-09-22 NOTE — Op Note (Signed)
[name], [name]                ACCOUNT NO.:  [id number]   MEDICAL RECORD NO.:  [id number]          PATIENT TYPE:  AMB   LOCATION:  SDS                          FACILITY:  [facility]   PHYSICIAN:  Juleen China IV, MDDATE OF BIRTH:  [birth year]   DATE OF PROCEDURE:  10/04/2008  DATE OF DISCHARGE:  10/04/2008                               OPERATIVE REPORT   PREOPERATIVE DIAGNOSIS:  Occluded HeRO catheter.   POSTOPERATIVE DIAGNOSIS:  Occluded HeRO catheter.   PROCEDURE PERFORMED:  1. Removal of the existing catheter, a portion of the HeRO device.  2. Replacement of catheter, a portion of the HeRO device.   ANESTHESIA:  General.   SURGEON:  1. Durene Cal IV, MD   BLOOD LOSS:  700 mL.   FINDINGS:  The original catheter had migrated into the innominate vein.  The connector had also migrated into the upper arm.   INDICATIONS:  [name] is a 77 year old female who has had multiple  previous dialysis access, which had never worked.  She underwent  placement of the HeRO catheter earlier which has occluded on 2  occasions.  It was found that it had been withdrawn from the right  atrium into the innominate vein, which was thought to be the reason for  its occlusion.  Earlier today, she underwent successful thrombolysis  with the Interventional Radiology Service who comes in today for  replacement of the catheter portion into a more central location.   PROCEDURE:  The patient was identified in the holding area and taken to  room #6.  She was placed supine on the table.  General anesthesia was  administered.  The patient was prepped and draped in the standard  sterile fashion.  A time-out was called.  I first made an incision with  a 11 blade in the neck and then I exposed the catheter in this incision.  It was encircled with a vessel loop.  The patient's previous incision in  the deltopectoral groove was opened and also exposed the catheter in  this area.  The connector between  the graft and catheter had migrated to  the upper arm.  I made an incision over the connecting portion of the  two pieces and exposed them.  I tried to reposition the connector into  the deltopectoral groove; however, I could not mobilize it and therefore  elected to replace the catheter portion leaving the connection in the  upper shoulder area.  At this point, the patient was given 3000 units of  heparin.  Using a #11 blade I cut the catheter off the connector device  and it was withdrawn through the incisions to the neck incision.  I  tried to advance a Bentson wire to the distal end of the catheter, but  was unable to do so.  I then used a Kumpe catheter to redirect the  Bentson wire into the central venous system.  I was able to advance the  Bentson wire and the Kumpe catheter into the inferior vena cava.  The  Bentson wire was removed and the Amplatz superstiff wire  was placed.  The catheter portion was then removed and the sheath from the kit was  placed into the right atrium.  I then obtained a new catheter portion  and then advanced it through the sheath and situated that in the right  atrium.  The peel-away sheath was removed as was the Bentson wire, a  clamp was placed on the catheter for hemostasis.   Next, the #4 Fogarty was used to perform thrombectomy of the arterial  end as I was not getting an inflow.  Intimal hyperplasia and thrombus  were removed and this reestablished good arterial inflow.  I ensured  that there was good backbleeding.  The catheter portion was then  tunneled under the skin at the device.  The two were then connected,  this was all done under fluoroscopy.  I ensured that the catheter tip  was in good position .  There was a palpable thrill within the upper arm  graft portion of the device.  At this point, I elected to close the deep  tissue and the incisions were reapproximated with 3-0 Vicryl.  The skin  was closed with 4-0 Vicryl on the two incisions.   The incision of the  neck was closed with 4-0 Vicryl.  Dermabond was placed.  The patient  tolerated the procedure well, was successfully extubated and taken to  the recovery room in stable condition.      Jorge Ny, MD  Electronically Signed     VWB/MEDQ  D:  10/04/2008  T:  10/05/2008  Job:  [id number]

## 2010-09-22 NOTE — Op Note (Signed)
[name], [name]                ACCOUNT NO.:  [id number]   MEDICAL RECORD NO.:  [id number]          PATIENT TYPE:  AMB   LOCATION:  SDS                          FACILITY:  [facility]   PHYSICIAN:  Di Kindle. Edilia Bo, M.D.DATE OF BIRTH:  [birth year]   DATE OF PROCEDURE:  03/29/2007  DATE OF DISCHARGE:  03/29/2007                               OPERATIVE REPORT   PREOPERATIVE DIAGNOSIS:  Chronic renal failure with clotted left upper  arm arteriovenous graft.   POSTOPERATIVE DIAGNOSIS:  Chronic renal failure with clotted left upper  arm arteriovenous graft.   PROCEDURE:  Thrombectomy of left upper arm arteriovenous graft with  revision, intraoperative arteriogram.   SURGEON:  Di Kindle. Edilia Bo, M.D.   ASSISTANT:  Cyndy Freeze, P.A.-C.   ANESTHESIA:  Local with sedation.   TECHNIQUE:  The patient was taken to the operating room and sedated by  anesthesia.  The left upper extremity was prepped and draped in the  usual sterile fashion.  The incision at the axilla was opened after the  skin was anesthetized and the venous end of the graft dissected free.  I  dissected up higher on the vein where it was slightly larger, also.  The  old venous anastomosis was taken down.  The patient was heparinized.  Graft thrombectomy was achieved using a #4 and #3 Fogarty catheter and  excellent inflow was established to the graft which was then flushed  with heparinized saline and clamped.  Of note, her pressure ran fairly  low throughout the case and this may potentially be a problem with her  graft remaining patent.  I dissected up higher on the vein, there was  enough redundant graft that I could excise some of the graft and  spatulate it and the graft was sewn end-to-end to the more proximal vein  using continuous 6-0 Prolene suture.  At the completion, there was a  good thrill in the graft.  An intraoperative arteriogram was obtained.  No technical problems were identified.   Hemostasis was obtained in the  wound.  The heparin was not reversed with protamine.  The wound was  closed with a deep layer of 3-0 Vicryl and the skin closed with 4-0  Vicryl.  A sterile dressing was applied.  The patient tolerated the  procedure well and was transferred to the recovery room in satisfactory  condition.  All needle and sponge counts were correct.      Di Kindle. Edilia Bo, M.D.  Electronically Signed     CSD/MEDQ  D:  03/29/2007  T:  03/29/2007  Job:  [id number]

## 2010-09-22 NOTE — Assessment & Plan Note (Signed)
OFFICE VISIT   Peugh, Gwen G  DOB:  06/18/1932                                       02/24/2009  [id number]#:[id number]   REASON FOR VISIT:  New dialysis access.   HISTORY:  This is a 77 year old female with multiple access procedures  in the past.  She most recently had to have the HeRO catheter removed as  it was nonfunctional.  She comes in today for discussions of thigh  grafts.   PAST MEDICAL HISTORY:  Reviewed today and includes MRSA pneumonia with  an empyema, diabetes, hypertension, renal failure, bilateral lower  extremity edema, anemia, secondary hypoparathyroidism.   The patient denies having any significant claudication.  She is not  having any chest pain.  She is overall in good spirits today.  She needs  to be on dialysis through a catheter on Tuesday, Thursday, Saturday.   PHYSICAL EXAMINATION:  Blood pressure 144/85, pulse 88, temperature is  98.3.  She is well-appearing, in no distress.  Cardiovascular:  Regular  rate and rhythm.  Respirations nonlabored.  Extremities:  Well-perfused.  She does not have palpable pedal pulses.  Abdomen:  Soft, nontender.  She is alert and oriented x3.  Skin:  Without rash.   DIAGNOSTIC STUDIES:  I have independently reviewed her ultrasound study  which shows an ankle brachial index of 0.61 with a toe pressure of 97.  The left is 0.68 with a toe pressure of 100.   ASSESSMENT:  End-stage renal disease.   PLAN:  The patient will be scheduled for a right thigh graft to be  performed on Friday, October 29th.  She understands the risk of steal as  well as the risk of infection.  All of her questions were asked.   Jorge Ny, MD  Electronically Signed   VWB/MEDQ  D:  02/24/2009  T:  02/25/2009  Job:  [id number]   cc:   [facility]

## 2010-09-22 NOTE — Op Note (Signed)
NAMEJAYLI, [name]                ACCOUNT NO.:  [id number]   MEDICAL RECORD NO.:  [id number]          PATIENT TYPE:  AMB   LOCATION:  SDS                          FACILITY:  [facility]   PHYSICIAN:  Charles E. Fields, MD  DATE OF BIRTH:  1932/07/12   DATE OF PROCEDURE:  03/20/2007  DATE OF DISCHARGE:                               OPERATIVE REPORT   PREOPERATIVE DIAGNOSIS:  End-stage renal disease.   POSTOPERATIVE DIAGNOSIS:  End-stage renal disease   OPERATION PERFORMED:  Left forearm arteriovenous graft.   SURGEON:  [name]. Fields, M.D.   ASSISTANT:  Billee Cashing, R.N.F.A.   ANESTHESIA:  Local with IV sedation.   OPERATIVE FINDINGS:  1. A 3.5-mm vein.  2. Calcified artery 4-7 mm PTFE graft.   OPERATION IN DETAIL:  After obtaining informed consent the patient was  brought to the operating room.  The patient was placed the supine  position on the operating table.  After adequate sedation the patient's  entire left upper extremity was prepped and draped in the usual sterile  fashion.  Local anesthesia was infiltrated near the antecubital crease.  A longitudinal incision was made in this location and was carried down  through subcutaneous tissues down level of the brachial artery.  An  adjacent deep brachial vein was also identified.  The vein was  approximately 3 mm in diameter.  The artery was heavily calcified.  The  area was dissected free circumferentially, and controlled proximally and  distally with vessel loops.  The vein was dissected free  circumferentially.  The small side branches were ligated and divided  between silk ties.   A subcutaneous tunnel was created in a loop configuration down the  forearm and a 4-7-mm tapered PTFE graft brought through the subcutaneous  tunnel with the 4 mm end of the graft on the ulnar aspect of the arm.  The patient was then given 5,000 units of heparin.  Vessel loops were  pulled taut on the brachial artery.  A longitudinal arteriotomy  was  made.  The 4 mm end of the graft was slightly beveled and sewn in the  graft on the to side of artery using a running 6-0 Prolene suture.  Just  prior to completion anastomosis the graft forward bled and was back  bled, and thoroughly flushed.  The anastomosis was secured.   The clamps were released.  There was pulsatile flow in the graft  immediately.  Next, the graft was pulled taut to length.  A small  bulldog clamp was used to control the vein proximally and distally.  A  longitudinal venotomy was made.  The vein was then probed with dilators  and found to accept up to a 3.5 meter dilator.  A four dilator would not  pass.  Next, the graft was beveled and sewn in the  graft to the side of  the vein using a running 6-0 Prolene suture.  Just prior to completion  anastomosis everything was forward bled and back bled, and thoroughly  flushed.  Anastomosis was secured.  The clamps were released.  There was  flow into the upper portion of the vein immediately; however, there was  no palpable thrill.  The radial and ulnar Doppler signals did augment  approximately 50% with compression of the graft.   Next, hemostasis was obtained.  The subcutaneous tissues were  reapproximated using running 3-0 Vicryl suture.  The skin was closed  with 4-0 Vicryl subcuticular stitch.   The patient tolerated the procedure well and there were no  complications.  Instrument, sponge and needle counts were correct at the  end of the case.  The patient was taken to the recovery room in stable  condition.      [name]. Fields, MD  Electronically Signed     CEF/MEDQ  D:  03/20/2007  T:  03/21/2007  Job:  [id number]

## 2010-09-22 NOTE — H&P (Signed)
[name], [name] NO.:  [id number]   MEDICAL RECORD NO.:  [id number]          PATIENT TYPE:  EMS   LOCATION:  MAJO                         FACILITY:  [facility]   PHYSICIAN:  Hollice Espy, M.D.DATE OF BIRTH:  [birth year]   DATE OF ADMISSION:  10/12/2006  DATE OF DISCHARGE:                              HISTORY & PHYSICAL   PRIMARY CARE PHYSICIAN:  Emeterio Reeve, M.D. of [facility]  at [place].   CHIEF COMPLAINT:  Left arm and leg pain.   HISTORY OF PRESENT ILLNESS:  Patient is a 77 year old African-American  female with a past medical history of hypertension, obesity, and  diabetes mellitus, who tells me that for the past two weeks, she has had  problems with worsening left-sided pain and weakness.  When specifically  asked to describe her symptoms with the help of a family member, it  started in her neck and bilateral shoulders.  She also had some  associated right-sided hip pain.  Since then, the pain in her shoulders  has eased up and has gone down and is more severely now in her left  elbow and across her left forearm into her left wrist.  She has also had  severe problems with her left knee and the top of her left foot.  She  has also noted some swelling in both her feet.  She came and was  evaluated in the emergency room approximately 10 days ago.  At that  time, she was given medication for pain, and she was found to have an  elevated blood pressure and given some medicine for blood pressure and  discharged to her PCP.  She returns back today with complaints, she says  that since then, her symptoms have not gotten much better.  She returns  back today with the symptoms complaining of the same.  She has found her  blood pressure to be initially 156/69, although as high as 192/77.  Labs  were checked on the patient, including an x-ray of her elbow and knee.  Her knee noted a fairly moderate-sized knee effusion.  Labs were  ordered.   She was found to have a CO2 level of 15, sodium 140, BUN 80,  creatinine 3.4.  A white count of 22 but with an 89% shift and a  platelet count of 691.  She was also found to have a sed rate of 110 and  a uric acid level of 10.7.  She did not note any type of dysuria.  She  was given in the emergency room some IV morphine, which she said did not  do much for her symptoms.  She is currently doing well.  She still  complains of severe pain, mostly in her left foot and left elbow.  She  denies any headaches, visual changes, dysphagia, chest pain,  palpitations, shortness of breath, wheeze, cough.  No abdominal pain, no  hematuria, dysuria, constipation, diarrhea, and again, focal extremity  pain, weakness, as described above, as well as some lower extremity  swelling.  Review of systems otherwise negative.   PAST  MEDICAL HISTORY:  1. Diabetes mellitus.  2. Hypertension.  3. Gout.  4. Chronic renal insufficiency followed by nephrology.   MEDICATIONS:  Based on her ER evaluation from two weeks ago, she is on  Cartia 240 daily, colchicine 0.6 p.r.n., clonidine 0.3 p.o. b.i.d.,  Lasix 40 p.o. b.i.d., Glucotrol 10 p.o. b.i.d., metoprolol 100 mg p.o.  b.i.d., Zemplar 1 mcg p.o. daily, Actos 50 mg p.o. daily, acetaminophen  p.r.n.   She has no known drug allergies.   SOCIAL HISTORY:  She denies any tobacco, alcohol, or drug use.   FAMILY HISTORY:  Noncontributory.   PHYSICAL EXAMINATION:  VITALS ON ADMISSION:  Temp 98.6, heart rate 68,  blood pressure 156/69, respirations 18, O2 sat 99% on room air.  Her  blood pressure is noted to be 193/77.  GENERAL:  Patient is alert and oriented x3 in some mild distress  secondary to pain.  HEENT:  Normocephalic and atraumatic.  Her mucous membranes are dry.  NECK:  She has no carotid bruits.  HEART:  Regular rate and rhythm.  S1 and S2.  LUNGS:  Decreased breath sounds throughout secondary to body habitus.  ABDOMEN:  Soft, obese, nontender.   Positive bowel sounds.  EXTREMITIES:  Her left elbow is slightly swollen.  She has some  tenderness in her left hand.  She also has a fair-sized effusion of her  left knee but a significant amount of pain in her left foot and  tenderness.  No tophi is noted.  She also has poor peripheral pulses and  2+ pitting edema.   LAB WORK:  Sodium 140, potassium 5.2, chloride 115, bicarb 15, BUN 80,  creatinine 7.4, glucose 49.  White count 22, H&H 10.7 and 34, MCV 78,  platelet count 691, 89% neutrophils.  Sed rate is 110.  Uric acid is  10.7.   ASSESSMENT/PLAN:  1. Joint pain:  Given the fact that initially her symptoms started off      in the bilateral shoulders and her right hip and then going down to      her elbow into her wrist and involving her left knee and left foot,      it sounds very much like an acute inflammatory attack of      polymyalgia rheumatica with some component of a gout exacerbation      as well.  Will start off by treating the patient with prednisone 10      mg p.o. b.i.d. and going from there.  At this time, we will hold on      her colchicine, given the fact of her significant renal      insufficiency.  I certainly would not put her on Allopurinol for      this acute attack.  2. Renal insufficiency:  I am unsure of the patient's baseline.  Will      gently hydrate.  3. Hypertension:  Will continue the patient's home medications,      holding on her diuretic.  4. Left knee effusion:  Given her elevated white count, I am certainly      concerned about a septic joint, although again, less likely so.      Will check a urinalysis to be thorough as well as order an      interventional radiology consult for aspiration and for the knee      joint.  At this time, she appears to be stable and does not appear      to be  septic.  We will check blood cultures and put her on      prophylactic IV antibiotics. 5. Diabetes mellitus:  She actually is relatively hypoglycemic,       probably given the poor p.o. intake from her acute illness.      Nevertheless, put on a sliding scale.  Once she is on prednisone,      her blood sugars should elevate anyway.  At that time, we can      adjust accordingly.  6. Renal insufficiency:  Again, we will gently hydrate.  Unsure of her      baseline.  We will need to touch base with nephrology.  7. Microcytic anemia:  This seems to be anemia of chronic disease.      She is normally on Procrit and iron.  Will continue these.  8. Weakness:  Get a PT/OT consult.      Hollice Espy, M.D.  Electronically Signed     SKK/MEDQ  D:  10/13/2006  T:  10/13/2006  Job:  [id number]   cc:   Emeterio Reeve, MD

## 2010-09-22 NOTE — Assessment & Plan Note (Signed)
OFFICE VISIT   Stubblefield, Ki G  DOB:  02/19/1933                                       06/24/2008  [id number]#:[id number]   REASON FOR VISIT:  Evaluate for dialysis access.   HISTORY:  This is a 77 year old female with end-stage renal disease  currently dialyzing through a right-sided catheter.  She has had several  failed attempts at dialysis access, none of which ever worked in her  upper extremities.  A hypercoagulable workup has been sent; however,  this was negative.  It was thought that her early thromboses were due to  her obesity, that being her only risk factor.  She comes in today.  She  has had her catheter in place for approximately a year.  We are here to  discuss the possibly of a HeRO catheter.  She has had venogram performed  at [facility] which I have reviewed.  She has diffusely diseased  central veins.  There is a catheter on the right side.   On physical examination, she is well-appearing, in no distress.  She is  afebrile, hemodynamically stable.  She has palpable pulses in the arms.   PLAN:  I think the patient is a candidate for a HeRO catheter.  Since  she already has a right-sided catheter in place, I will plan on doing  this on the right side and converting her Diatek into a HeRO catheter.  The patient also understands that she will also need to have a new  catheter placed.  This could potentially be done in the groin.  It will  be a temporary catheter which can be removed at 3 weeks once the HeRO  catheter is available for use.  She dialyzes on Tuesday, Thursday,  Saturday.  We will schedule her for a HeRO catheter in the next several  weeks.   Jorge Ny, MD  Electronically Signed   VWB/MEDQ  D:  06/24/2008  T:  06/25/2008  Job:  [id number]   cc:   Terrial Rhodes, M.D.

## 2010-09-22 NOTE — Discharge Summary (Signed)
NAMEGAYLEEN, [name]                ACCOUNT NO.:  [id number]   MEDICAL RECORD NO.:  [id number]          PATIENT TYPE:  INP   LOCATION:  2040                         FACILITY:  [facility]   PHYSICIAN:  Kela Millin, M.D.DATE OF BIRTH:  [birth year]   DATE OF ADMISSION:  11/07/2006  DATE OF DISCHARGE:  11/25/2006                               DISCHARGE SUMMARY   DISCHARGE DIAGNOSES:  1. Methicillin resistant Staphylococcus aureus pneumonia.  2. Empyema - status post chest tube.  3. Chronic renal insufficiency - her last creatinine prior to      discharge is 2.72, patient is followed by Dr. Kathrene Bongo      (nephrologist).  4. Diabetes mellitus, type 2.  5. Hypertension.  6. Anemia of chronic disease.  7. History of secondary hyperparathyroidism.  8. History of gout.  9. Morbid obesity.   CONSULTATIONS:  1. CVTS, Dr. Edwyna Shell.  2. Infectious Disease, Dr. Darlina Sicilian.   PROCEDURES AND STUDIES:  1. CT scan of chest without contrast - bilateral lower lobe      consolidations with focal gas/fluid collection in the posterior      left hemithorax and small left pleural effusion, likely empyema.      This collection could possibly lie within the lung representing a      lung abscess.  Nonspecific 6 mm noncalcified right upper lobe      pulmonary nodule.  Recommend CT scan followup in six to nine      months.  Cholelithiasis.  8 mm sclerotic lesion within the left      aspect of T10 vertebral body, nonspecific but may represent a bone      island.  2. November 15, 2006 - CT scan guided chest tube placement - per      interventional radiology, a #12 French left chest tube for drainage      of empyema.  3. November 19, 2006 - followup CT scan, pigtail catheter with left      posterior pleural collection with significant decrease in the size      of the air and fluid collection.   INCOMPLETE REPORT      Kela Millin, M.D.  Electronically Signed     ACV/MEDQ  D:  11/25/2006  T:   11/25/2006  Job:  [id number]

## 2010-09-22 NOTE — Op Note (Signed)
NAMEADEN, [name]                ACCOUNT NO.:  [id number]   MEDICAL RECORD NO.:  [id number]          PATIENT TYPE:  AMB   LOCATION:  SDS                          FACILITY:  [facility]   PHYSICIAN:  Charles E. Fields, MD  DATE OF BIRTH:  [birth year]   DATE OF PROCEDURE:  03/20/2007  DATE OF DISCHARGE:                               OPERATIVE REPORT   PROCEDURE:  Left upper arm arteriovenous graft.   PREOPERATIVE DIAGNOSIS:  End-stage renal disease.   POSTOPERATIVE DIAGNOSIS:  End-stage renal disease.   ANESTHESIA:  Local with IV sedation.   INDICATIONS:  The patient is a 77 year old female who earlier today had  a left forearm AV graft placed.  The graft was no audible Doppler flow.  The the patient had a small vein at the time of initial graft placement.  It is thought that the outflow is marginal and she now needs a left  upper arm graft instead.   OPERATIVE DETAILS:  After obtaining informed consent, the patient taken  to the operating room.  The patient was placed in the supine position on  the operating table.  After adequate sedation, the patient's entire left  upper extremity was prepped and draped in the usual sterile fashion.  Local anesthesia was infiltrated up in the axilla.  A longitudinal  incision was made in this location and carried down through the  subcutaneous tissues down to level of the axillary vein.  Axillary vein  was dissected free circumferentially.  Small side branches were ligated  and divided between silk ties.  Next attention was turned to a  preexisting incision near the antecubital crease.  This was reopened,  carried down through the subcutaneous tissues down to the level of the  graft.  The graft was palpated.  There was found be pulsatile flow but  the flow up in the outflow vein was poor.  It was decided at point to  replace the graft with an upper arm graft.  The outflow deep brachial  vein was ligated proximal and distal to the graft.  Vessel  loops were  placed around the brachial artery.  The patient was given 5000 units of  heparin.  The vessel loops were pulled taut on the brachial artery and  the 4-7-mm previously placed AV graft was completely removed.  Next a  new 4-7-mm tapered PTFE graft was brought up on the operative field.  This was tunneled subcutaneously, connecting the lower arm incision to  the upper arm incision.  The 4-mm end of the graft was slightly beveled.  This was then sewn end of graft to side of artery using a running 6-0  Prolene suture.  Just prior completion of the anastomosis this was fore  bled, back bled and thoroughly flushed.  The anastomosis was secured,  clamps released.  There was pulsatile flow in the graft immediately.  Next the distal axillary vein was ligated with a 3-0 silk tie.  The vein  was controlled proximally with a fine bulldog clamp.  The vein was then  transected and spatulated.  The graft was pulled taut  to length and  beveled.  It was then sewn end graft to the end of vein using a running  6-0 Prolene suture.  Just prior to completion of the anastomosis this  was fore bled, back bled and thoroughly flushed.  The anastomosis was  secured, clamps were released.  There was again pulsatile flow in the  fistula but the thrill was weak.  Doppler was used to inspect the radial  and ulnar arteries.  These augmented by approximately 40% with  compression of the graft.  Next hemostasis was obtained.  Subcutaneous  tissues of both incisions were closed with a  running 3-0 Vicryl suture.  Skin of both incisions was closed with a 4-0  Vicryl subcuticular stitch.  The patient tolerated the procedure well  and there were no complications.  The instrument, sponge and needle  count was correct at the end of the case.  The patient was taken to the  recovery room in stable condition.      [name]. Fields, MD  Electronically Signed     CEF/MEDQ  D:  03/20/2007  T:  03/21/2007  Job:   [id number]

## 2010-09-22 NOTE — Assessment & Plan Note (Signed)
OFFICE VISIT   [name], [name]  DOB:  12-Jun-1932                                       03/08/2007  ZSWFU#:[id number]   The patient is a 77 year old female referred by Dr. Caryn [name] for placement of  hemodialysis access.  The patient had a right-sided tunnel catheter  placed by radiology in August of 2008.  She has been on hemodialysis  since that time.  Renal failure was thought to be secondary to diabetes  and hypertension.  She is right-handed.  She has had no problems with  dialysis so far.   PAST MEDICAL HISTORY:  Significant for MRSA pneumonia, empyema,  diabetes, hypertension, renal failure, bilateral lower extremity edema,  anemia, secondary hypoparathyroidism, gout.   MEDICATIONS:  Aspirin 81 mg once a day.  Celexa 10 mg once a day.  Prilosec 20 mg once a day.  Nephro-Vite 1 daily.  Pro-Stat 64 SF liquid  30 mL once daily.  Os-Cal 500 mg twice daily.  Lopressor 50 mg twice  daily.  Lantus insulin 35 units each night at bedtime.  NovoLog insulin  sliding scale.  Epo with hemodialysis.  Nepro 1 can twice a day.  Percocet p.r.n.   She has no known drug allergies.   REVIEW OF SYSTEMS:  She has had some recent weight loss, but denies loss  of appetite.  From a cardiac standpoint, she denies history of chest pain or shortness  of breath, or cardiac arrhythmia.  PULMONARY:  She has no history of asthma or wheezing.  GI:  She has no history of GI bleeding.  GU:  Renal failure, as mentioned above.  VASCULAR:  She thinks she may have had a stroke in the past and her  right hand has been a little bit clumsy, but this was a lengthy time  ago.  NEURO:  She denies recent syncopal episodes or dizziness.  ORTHOPEDIC:  She has some history of gout and arthritis in her knees.  PSYCHIATRIC:  She is alert and oriented x3.  ENT:  She wears glasses.  Has no decrease in visual acuity recently.  HEMATOLOGICAL:  She has no history of easy bleeding or clotting   disorders.  ENDOCRINE:  She has no history of hypothyroidism disease.   PHYSICAL EXAM:  Blood pressure 109/66 in the left arm, 117/72 in the  right arm, pulse 85 and regular.  HEENT is unremarkable.  She has 2+  carotid pulses without bruits.  Chest is clear to auscultation.  Cardiac  exam is regular rate and rhythm.  Abdomen is obese, soft, and nontender,  nondistended.  She has 1+ brachial pulses bilaterally.  She has absent  radial and ulnar pulses bilaterally.  She has a right-sided Diatek  catheter.  Chest is clear to auscultation.  Cardiac exam is regular rate  and rhythm without murmur.  She has bilateral sensory loss in the tips  of the fingers in both sides.  She has no obvious veins in her upper arm  or forearm on placement of a tourniquet on the arm.  She has obese upper  arms.   I believe the best option for [name] is going to be placement of a  left forearm AV graft since she is right-hand-dominant.  I have  discussed with her the risks, benefits, and possible complications of  the procedure today including but  not limited to bleeding, infection,  graft thrombosis, and steal.  We will place a 4 to 7 mm taper graft to  try to reduce this risk as she has an absent radial pulse in both sides.  She understands and agrees to proceed.  We have scheduled this for  March 20, 2007.  Of note, she is a nursing home patient, so she may  need to be a second case or later due to transportation issues.   Jamie Hora. Fields, MD  Electronically Signed   CEF/MEDQ  D:  03/08/2007  T:  03/09/2007  Job:  [id number]   cc:   [name]. [name], M.D.

## 2010-09-22 NOTE — Consult Note (Signed)
NAMENILZA, EAKER NO.:  [id number]   MEDICAL RECORD NO.:  [id number]          PATIENT TYPE:  INP   LOCATION:  5526                         FACILITY:  [facility]   PHYSICIAN:  Antonietta Breach, M.D.  DATE OF BIRTH:  May 05, 1933   DATE OF CONSULTATION:  12/27/2006  DATE OF DISCHARGE:                                 CONSULTATION   REQUESTING PHYSICIAN:  Altha Harm, MD, of the [facility].   REASON FOR CONSULTATION:  Depression.   HISTORY OF PRESENT ILLNESS:  Ms. Aull is a 77 year old female admitted  to the [facility] on August 8 due to renal insufficiency and  weakness.  The patient has continued to have progressive medical  difficulty with acute renal failure on top of chronic renal failure and  hypoxemia.   The patient asks that her daughter be in the room to help with history.  The patient has more than 4 weeks of progressive depressed mood, low  energy, difficulty concentrating, poor appetite and anhedonia.  The  patient is not had any thoughts of harming herself.  She has not had  thoughts of harming others.  She has had no hallucinations or delusions.   PAST PSYCHIATRIC HISTORY:  No history of major depression.  No history  of mania.  No history of hallucinations or delusions.  The patient has  no history of psychiatric care.   FAMILY PSYCHIATRIC HISTORY:  None known.   SOCIAL HISTORY:  Divorced.  Religion:  Baptist.  The patient draws much  support from her religion and continues to draw support while in the  hospital.  Her daughter and granddaughter are visiting regularly in the  room, in addition to the daughter's fiance.  The patient does not use  alcohol or illegal drugs.  Occupation:  Retired.   PAST MEDICAL HISTORY:  1. Acute on chronic renal insufficiency.  2. Metabolic acidosis, resolved.  3. Generalized rash, being treated with triamcinolone and Cetaphil.  4. Hypoxemia.  5. Leukocytosis.  6. Anemia.   MEDICATIONS:  The  MAR is reviewed.  The the patient is not on any  current psychotropics.   She is allergic to VANCOMYCIN.   LABORATORY DATA:  Sodium 139, BUN 42, creatinine 4.24, calcium 7.6.  WBC  19.2, hemoglobin 19.3, platelet count 439.  RBC folate on August 11 was  more than adequate.  B12 on August 11 was within normal limits.  TSH on  July 2 was within normal limits.   REVIEW OF SYSTEMS:  CONSTITUTIONAL:  Afebrile.  No weight loss.  HEAD:  No trauma.  EYES:  No visual changes.  EARS:  No hearing impairment.  NOSE:  No rhinorrhea.  MOUTH/THROAT:  No sore throat.  NEUROLOGIC:  No  focal motor or sensory deficits.  PSYCHIATRIC:  As above.  CARDIOVASCULAR:  No chest pain or palpitations.  RESPIRATORY:  No  coughing or wheezing.  The patient is receiving respiratory treatment.  GASTROINTESTINAL:  No nausea, vomiting, diarrhea,  GENITOURINARY:  No  dysuria.  SKIN:  Rash as above.  MUSCULOSKELETAL:  No deformities.  ENDOCRINE/METABOLIC:  No  heat or cold intolerance.  HEMATOLOGIC/LYMPHATIC:  Anemia.   EXAMINATION:  VITAL SIGNS:  Temperature 98.1, pulse 107, respiration 23,  blood pressure 140/90, O2 saturation on 10 L 91%.  Weight is 116.7 kg.  GENERAL APPEARANCE:  Ms. Elsayed is an elderly female appearing her  chronologic age, lying in a supine position in her hospital bed.  She  has no abnormal involuntary movements.  She is well-groomed.  OTHER MENTAL STATUS EXAM:  Ms. Marcott has partial eye contact.  Her  attention span is mildly decreased.  Her concentration is decreased.  She is oriented to all spheres.  Her memory is grossly intact to  immediate, recent and remote and on formal testing she names 3/3  immediate and 1/3 at 5 minutes.   Speech involves no dysarthria.  Her volume is soft.  Her rate is normal  and her prosody is normal.  Her fund of knowledge and intelligence are  within normal limits.  Thought process is logical, coherent, goal-  directed.  No looseness of associations.   Abstraction and calculations  are grossly intact.  She is able to calculate the date.  She also can  understand metaphors.  Language expression and comprehension are intact.  Thought content:  No thoughts of harming herself, no thoughts of harming  others, no delusions, no hallucinations.  Affect is constricted.  Mood  is depressed.  Insight is intact for her depression.  Judgment is within  normal limits.   ASSESSMENT:  AXIS I:  293.83, mood disorder not otherwise specified,  depressed (functional and general medical factors).  296.023, rule out  major depressive disorder, single episode, severe.  AXIS II:  None.  AXIS III:  See general medical problems.  AXIS IV:  General medical.  AXIS V:  55.   The undersigned provided ego-supportive psychotherapy and education.   The indications, alternatives and adverse effects of Celexa were  discussed with the patient and her daughter for antidepression.  They  understand and would like to try Celexa.   RECOMMENDATIONS:  1. Would start Celexa at 10 mg p.o. q.a.m. and then would increase as      tolerated after 3-4 days to 20 mg p.o. q.a.m. for antidepression.  2. Would recheck her TSH if she continues with low energy.   PRELIMINARY DISCHARGE PLANNING:  Outpatient psychiatric follow-up can be  found at the clinic attached to [facility], [place] or  [facility].      Antonietta Breach, M.D.  Electronically Signed     JW/MEDQ  D:  12/28/2006  T:  12/30/2006  Job:  [id number]

## 2010-09-22 NOTE — Assessment & Plan Note (Signed)
OFFICE VISIT   [name], Jamie G  DOB:  [birth year]                                       11/04/2008  [id number]#:[id number]   REASON FOR VISIT:  Followup HeRO.   HISTORY:  This is a 77 year old female who underwent a HeRO catheter on  July 12, 2008.  She recently had removal of the existing catheter  portion of her device and replacing it.  This has also thrombosed.  The  graft was only used 3 times.  She is now having problems with facial  swelling.   At this point in time I think the patient has failed access with the  HeRO catheter, and for that reason I am recommending removal.  This will  be scheduled for this Friday.  I will remove the catheter portion of her  graft.   Jorge Ny, MD  Electronically Signed   VWB/MEDQ  D:  11/04/2008  T:  11/05/2008  Job:  [id number]   cc:   Terrial Rhodes, M.D.

## 2010-09-22 NOTE — Assessment & Plan Note (Signed)
OFFICE VISIT   [name], [name]  DOB:  August 10, 1932                                       04/19/2007  VHQIO#:[id number]   [name] presents today for evaluation for a new hemodialysis access  referred by [facility]. She has had prior grafts in her left arm  that have all occluded. She had a left forearm and left upper arm graft  previously. Renal failure was thought to be secondary to diabetes and  hypertension. She is right handed.   PHYSICAL EXAMINATION:  Blood pressure 95/65 in the right arm. Pulse is  96 and regular. Left upper extremity shows multiple occluded grafts with  no palpable radial pulse. The right upper extremity also has absent  radial pulse, but does have monophasic radial and ulnar Doppler signals.  She has baseline decreased sensation in both hands most likely from  neuropathy. Her veins are small and she is not a candidate for a  fistula.   I believe that the next access for [name] should be a right forearm  AV graft. I did discuss with her that if the veins in the antecubital  region are small, we would place an upper arm graft at the time of the  initial operation. Risks, benefits, possible complications and procedure  details were discussed with the patient. We have scheduled her for  April 24, 2007. She is currently dialyzing via Diatek catheter with  no difficulty.   [name]. Fields, MD  Electronically Signed   CEF/MEDQ  D:  04/20/2007  T:  04/20/2007  Job:  [id number]   cc:   Wilber Bihari. Caryn Section, M.D.

## 2010-09-22 NOTE — Op Note (Signed)
[name], [name]                ACCOUNT NO.:  [id number]   MEDICAL RECORD NO.:  [id number]          PATIENT TYPE:  AMB   LOCATION:  SDS                          FACILITY:  [facility]   PHYSICIAN:  VDurene Cal IV, MDDATE OF BIRTH:  1933-01-25   DATE OF PROCEDURE:  11/08/2008  DATE OF DISCHARGE:                               OPERATIVE REPORT   PREOPERATIVE DIAGNOSIS:  Occluded HeRO catheter.   POSTOPERATIVE DIAGNOSIS:  Occluded HeRO catheter.   PROCEDURE PERFORMED:  Removal of HeRO catheter.   ANESTHESIA:  General.   BLOOD LOSS:  Minimal.   COMPLICATIONS:  None.   SURGEON:  1. Charlena Cross, MD   INDICATIONS:  Ms. Glanz is a 77 year old female with end-stage renal  disease who has undergone placement of HeRO catheter.  Her catheter has  occluded multiple times.  She has also undergone operative revision.  Catheter has reoccluded.  I do not think this is going to be a viable  option for access for her.  Therefore she comes in to remove the  catheter portion.   PROCEDURE:  The patient was identified in the holding area and taken to  the room 8, she was placed supine on the table.  General LMA anesthesia  was initiated.  The patient was prepped and draped in the standard  sterile fashion.  A time-out was called.  Antibiotics were given.  The  patient's incision over the connection between the catheter and graft  was anesthetized with 1% Lidocaine.  A #10 blade was used to make the  skin incision.  Cautery was used to dissect down to the connector.  The  connector was circumferentially dissected free.  The catheter was then  removed from the central venous system, manual pressure was held for 5  minutes until it was hemostatic.  The wound was then irrigated.  The  catheter was transected and the graft end was ligated with a 5-0 Prolene  in 2 layers.  Next, the deep tissue was reapproximated with 3-0 Vicryl,  and the skin was closed with 3-0 Vicryl.  The patient tolerated  the  procedure well.  She was taken to recovery room in stable condition.  There were no complications.      Jorge Ny, MD  Electronically Signed     VWB/MEDQ  D:  11/08/2008  T:  11/08/2008  Job:  [id number]

## 2010-09-22 NOTE — Assessment & Plan Note (Signed)
OFFICE VISIT   NELA, BASCOM  DOB:  10-03-1932                                       11/15/2007  ZOXWR#:[id number]   The patient is a 77 year old female who was last seen in February of  2009 for consideration of placement of a thigh graft.  She currently has  used up all of her upper extremity access sites.  At that time, she had  calcified vessels on her ABIs which showed monophasic flow to the  dorsalis pedis and posterior tibial on the right and biphasic flow to  the left dorsalis pedis and posterior tibia.  Toe pressure was 62 on the  right and 54 on the left.  With the marginal arterial circulation to her  lower extremities, she was somewhat reluctant to have a thigh graft  placed in the event that she could develop a steal.  She presents today  for reevaluation for placement of the thigh graft.  She currently  dialyzes on Tuesday, Thursday, and Saturday.  She currently is using a  Diatek catheter for her dialysis.   PHYSICAL EXAM:  Today blood pressure is 144/78 in the left arm, pulse is  99 and regular.  Lower Extremities:  She has barely palpable femoral  pulses bilaterally.  She has absent popliteal and pedal pulses  bilaterally.  She has multiple arm graft scars in both upper  extremities.   I discussed with her again today the risks and benefits of catheter  versus thigh graft as well as the possibility of steal and she still  does not know whether or not she wishes to have a thigh graft placed  versus keeping her catheter.  If she decides she wants a thigh graft  placed we would put it in her left thigh.  She will call back if she  wishes to have this scheduled on a nondialysis day at some point in the  future.  She will follow up on an as-needed basis, otherwise.   [name]. Fields, MD  Electronically Signed   CEF/MEDQ  D:  11/15/2007  T:  11/16/2007  Job:  [id number]   cc:    [facility]

## 2010-09-22 NOTE — Op Note (Signed)
NAMECLEA, DUBACH                ACCOUNT NO.:  [id number]   MEDICAL RECORD NO.:  [id number]          PATIENT TYPE:  AMB   LOCATION:  SDS                          FACILITY:  [facility]   PHYSICIAN:  Di Kindle. Edilia Bo, M.D.DATE OF BIRTH:  [birth year]   DATE OF PROCEDURE:  07/18/2008  DATE OF DISCHARGE:                               OPERATIVE REPORT   PREOPERATIVE DIAGNOSIS:  Chronic kidney disease with poorly functioning  right internal jugular Diatek catheter.   POSTOPERATIVE DIAGNOSIS:  Chronic kidney disease with poorly functioning  right internal jugular Diatek catheter.   PROCEDURE:  Placement of new right internal jugular Diatek catheter.   SURGEON:  Di Kindle. Edilia Bo, MD   ANESTHESIA:  Local with sedation.   INDICATIONS:  This is a 77 year old woman who has had a right IJ  catheter for approximately a year she thinks.  Approximately, a week  ago, she had a new left upper arm graft placed using the HERO device.  She went for dialysis today and the catheter was not working.  She could  not dialyze.  She was sent to have her catheter replaced.   TECHNIQUE:  The patient was taken to the operating room and the right  neck and chest were prepped and draped in the usual sterile fashion.  The entrance site to the IJ was palpated and the skin anesthetized over  this area.  The incision was made and the catheter dissected free and  brought into the wound and then clamped and divided.  The guidewire was  then passed through the catheter and the distal part was removed and the  cuff was completely removed.  The old catheter was then removed and then  the dilator and peel-away sheath were advanced over the wire and the  dilator was removed.  A new 28-cm catheter was threaded over the wire  through the peel-away sheath down into the right atrium and the wire was  then removed.  The exit site for the catheter was selected and the skin  anesthetized between the two areas.  The  catheter was then brought  through the tunnel, cut to the appropriate length and the distal ports  were attached.  It did required some manipulation to find an area of the  catheter that would work and it would only work when it was deep into  the right atrium.  Possible that the HERO catheter was somehow  obstructing it as I never having pulled the catheter up higher, the  arterial port would not withdraw.  However, it appeared to be  functioning very well in the position where it was located fairly low.  There was no kink and the catheter was secured at its exit site with 3-0  nylon suture.  The IJ cannulation site was closed with a 4-0  subcuticular stitch.  A sterile dressing was applied.  The patient  tolerated the procedure well and was transferred to the recovery room in  satisfactory condition.  All needle and sponge counts were correct.      Di Kindle. Edilia Bo, M.D.  Electronically Signed  CSD/MEDQ  D:  07/18/2008  T:  07/19/2008  Job:  [id number]

## 2010-09-22 NOTE — H&P (Signed)
[name], [name]                ACCOUNT NO.:  [id number]   MEDICAL RECORD NO.:  [id number]          PATIENT TYPE:  INP   LOCATION:  5524                         FACILITY:  [facility]   PHYSICIAN:  Corinna L. [name], MDDATE OF BIRTH:  [birth year]   DATE OF ADMISSION:  11/07/2006  DATE OF DISCHARGE:                              HISTORY & PHYSICAL   CHIEF COMPLAINT:  Cough and shortness of breath and weakness.   HISTORY OF PRESENT ILLNESS:  [name] is a 77 year old black female  patient of Dr. Paulino Rily, who presented to the emergency room with  shortness of breath.  She has also had a cough and chest congestion.  She started coughing up yellow sputum today.  She has had chills and  night sweats.  She is febrile here in the emergency room.  She feels  weak over the past few days.  Her appetite has been poor over the past  few days.  She was hospitalized earlier in the month for gout and has  been on a course of prednisone recently.  She also has diabetes and her  blood sugars have been labile.  She comes from the nursing home.   PAST MEDICAL HISTORY:  1. Morbid obesity.  2. Diabetes type 2.  3. Hypertension.  4. Chronic renal insufficiency.  5. Gout.  6. Leukocytosis during her last hospitalization thought to be      secondary to gout and then prednisone.  7. Anemia of chronic disease.  8. Secondary hyperparathyroidism.   MEDICATIONS:  1. Glucotrol 10 mg p.o. b.i.d.  2. Lantus 22 units subcutaneously nightly.  3. Actos 30 mg a day.  4. Prednisone 5 mg a day.  5. Colchicine 0.6 mg a day.  6. Allopurinol 100 mg a day.  7. Iron sulfate 325 mg p.o. b.i.d.  8. Colace 100 mg a day.  9. Lasix 40 mg a day.  10.Zemplar 1 mcg daily.  11.Norvasc 10 mg a day.  12.Catapres 0.3 mg twice a day.  13.Diltiazem 240 mg a day.  14.Lopressor 100 mg p.o. b.i.d.   SOCIAL HISTORY:  The patient is a former smoker.  She used to smoke  about a pack every 3 days a long time ago.  She denies  alcohol use.   FAMILY HISTORY:  Noncontributory.   REVIEW OF SYSTEMS:  As above, otherwise negative.   PHYSICAL EXAMINATION:  VITAL SIGNS:  Her temperature is 101.6 rectally,  blood pressure 131/62, pulse 102, respiratory rate 22, oxygen saturation  22% __________ .  GENERAL:  The patient is an obese black female in no acute distress.  HEENT: Normocephalic, atraumatic.  Pupils equal, round and reactive to  light.  Slightly dry mucous membranes with some white plaques and  ulceration on her palate.  NECK:  Thick and supple.  LUNGS:  Clear to auscultation bilaterally without wheezes, rhonchi or  rales.  CARDIOVASCULAR:  Regular rate and rhythm without murmurs, gallops or  rubs.  ABDOMEN:  Obese, nontender.  GU:  Deferred.  RECTAL:  Deferred.  EXTREMITIES:  She has 2+ pitting edema.  SKIN:  She has  erythema over her coccyx and a about 1 cm stage II  pressure sore in the sacral area at the gluteal cleft.  NEUROLOGIC:  Alert and oriented.  Cranial nerves and sensorimotor exam  are grossly intact.  PSYCHIATRIC:  Normal affect.   LABORATORY DATA:  White blood cell count 22,000, which is what it had  been running throughout the month, hemoglobin 8.4, hematocrit 26.9, MCV  77, platelet count 356, moderate left shift.  Sodium 133, potassium 5.6,  chloride 104, bicarbonate 16, glucose 338, BUN 72, creatinine 3.58,  total bilirubin 1.8, alkaline phosphatase 189, SGOT 42, SGPT 56, total  protein 6.5, albumin 2.1.  on 11 her BUN was 106, creatinine 4.2, her  bicarbonate was 14.  On June 10 she had an iron level of 115, TIBC of  242.  Her hemoglobin A1c June 5 was 6.4.  Vitamin B12 944, ferritin  1435, folate 4.6.  Chest x-ray shows bibasilar pneumonia.  EKG shows  normal sinus rhythm with age-indeterminate anterior infarct, left axis  deviation.   ASSESSMENT AND PLAN:  1. Pneumonia.  The patient has been given Rocephin and azithromycin      here in the emergency room.  As she has come  from skilled nursing      facility and has had a recent hospitalization, I will broaden her      coverage to Zosyn and vancomycin to better cover hospital-      acquired/nursing home-acquired pneumonia.  She will get supportive      care.  She is weak, and I will get a PT/OT evaluation.  2. Leukocytosis.  The patient's white blood cell count has been      consistently above 20 throughout the month of June.  She may need a      hematology consult once her pneumonia is treated if her white blood      cell count does not normalize  3. Hyperkalemia.  The patient will be monitored on telemetry and I      will check another BMET in the morning.  4. Chronic renal insufficiency.  5. Hypertension.  Continue outpatient medications.  6. Uncontrolled diabetes.  For now I will leave her on her home      regimen as she says that she sometimes suffers      from lows, but we may need to increase her diabetes medications.  I      will also check another hemoglobin A1c.  7. Morbid obesity.  8. Gout.      Corinna L. [name], MD  Electronically Signed     CLS/MEDQ  D:  11/07/2006  T:  11/08/2006  Job:  [id number]   cc:   Emeterio Reeve, MD

## 2010-09-22 NOTE — H&P (Signed)
[name], [name] NO.:  [id number]   MEDICAL RECORD NO.:  [id number]          PATIENT TYPE:  INP   LOCATION:  1827                         FACILITY:  [facility]   PHYSICIAN:  [name], M.D. DATE OF BIRTH:  January 25, 1933   DATE OF ADMISSION:  12/16/2006  DATE OF DISCHARGE:                              HISTORY & PHYSICAL   PRIMARY CARE PHYSICIAN:  [name], M.D.   CHIEF COMPLAINT:  My kidneys are not working good.   HISTORY OF PRESENT ILLNESS:  [name] is a 77 year old female with a  past medical history of hypertension,  diabetes mellitus, and chronic  renal insufficiency.  She currently resides at [facility] and  has been there since November 25, 2006.  She was somewhat vague with regards  to what brought her into the hospital.  The facility was contacted, and  [name], who is an LPN at the facility, gave a history.  She states  that, over the past 1-2 weeks, [name] has been developing more  generalized edema.  As a result, Lasix has been provided to the patient.  She has been receiving 60 mg a day of the Lasix.  There does not appear  to be any improvement in the patient's overall edematous appearance.  Routine labs have been ordered to follow the patient's renal status  secondary to her being on the Lasix.  The patient's BUN was noted to be  56, her creatinine 5.3 today, December 16, 2006.  Because of this, the  patient was brought to the hospital for evaluation.  [name] also  indicated that the patient had been previously treated with vancomycin  up until Monday which is approximately 4 days ago.  The vancomycin was  discontinued on Monday.  Tuesday, the following day, the patient was  noted to have developed a generalized erythematous rash.  She indicates  that Wednesday the patient was brought to the hospital for a followup  chest x-ray.  At that time the patient was told that she appeared to be  fine with regards to her chest  x-ray; however, she was diagnosed with  having a UTI.  Ciprofloxacin was initiated.  The patient currently  denies having any shortness of breath.  She did have a fever of 100.4  last night.   PAST MEDICAL HISTORY:  Comes from prior Discharge Summaries.  1. MRSA pneumonia.  The patient was treated with a combination of      Zosyn and vancomycin.  It appears that the patient received at      least 19 days of IV vancomycin.  2. Empyema.  The patient had a chest tube placed by [name] back in      July 2008.  3. Diabetes mellitus.  4. Hypertension.  5. Chronic renal insufficiency.  The patient has been followed by Dr.      Kathrene [name] in the past.  6. Bilateral lower extremity edema.  7. Anemia of chronic disease.  8. Secondary hyperparathyroidism.  9. Gout.  10.Iron-deficiency anemia.   ALLERGIES:  No known drug allergies.  CURRENT MEDICATIONS:  1. Allopurinol 100 mg p.o. daily.  2. Norvasc 10 mg p.o. daily.  3. Colchicine 0.6 mg p.o. daily.  4. Cardizem CD 240 mg 1 tablet p.o. daily.  5. Zemplar 1 mcg p.o. daily.  6. Tessalon Perles 100 mg b.i.d.  7. Clonidine 0.3 mg p.o. b.i.d.  8. Colace 100 mg p.o. b.i.d.  9. Ferrous sulfate 325 mg b.i.d.  10.Lopressor 100 mg p.o. b.i.d.  11.Trazodone 50 mg nightly.  12.Sliding scale insulin.  13.Lasix 80 mg p.o. daily.  14.Phenergan 25 mg IM.  15.Tylenol 650 mg q. 4 h p.r.n.  16.Senokot 1 tablet nightly p.r.n.  17.Albuterol 0.083 per nebulizer q. 6 h p.r.n.  18.Eucerin cream applied to entire body daily for 10 days.  19.Ciprofloxacin was recently started 250 mg p.o. daily for 7 days.   SOCIAL HISTORY:  The patient has a remote history of cigarette smoking.  Alcohol:  The patient has denied use of alcohol in the past.   FAMILY HISTORY:  Mother is deceased secondary to a CVA.  Father is  deceased secondary to cancer.   REVIEW OF SYSTEMS:  As per HPI.   PHYSICAL EXAMINATION:  GENERAL:  The patient is a morbidly obese female   who looks edematous.  SKIN:  She has a generalized edematous rash extending from her head down  to her ankles.  VITAL SIGNS:  Temperature 97.1, blood pressure 112/50, heart rate 72,  respirations 24, O2 saturation 95% on 2 liters of O2.  HEENT:  Normocephalic and atraumatic.  Anicteric.  Extraocular movements  are intact.  Decreased reactivity to pupils bilaterally.  Oral mucosa  pink with no thrush or exudates.  CARDIAC:  Distant heart sounds.  RESPIRATORY:  Decreased breath sounds bilaterally.  ABDOMEN:  Soft,  nontender, nondistended.  Positive bowel sounds.  No  masses.  EXTREMITIES:  Positive bilateral edema in both upper and lower  extremities.  NEUROLOGIC:  The patient is alert and oriented x3.  Cranial nerves II-  XII intact.  Gag reflex was not tested.  MUSCULOSKELETAL:  5/5 bilateral arm strength, 5/5 right leg strength,  4/5 left leg strength.   LABORATORY DATA:  Labs brought in with the patient from [facility] Glucose 86, BUN 56, creatinine 5.3, sodium 141,  potassium 5.1, chloride 111, CO2 14, calcium 8.1.   ASSESSMENT AND PLAN:  1. Renal insufficiency.  This appears to be acute an chronic process,      the etiology of which is questionable.  Whether or not the recently      administered antibiotics contributed to the patient's current      situation is also questionable.  Likewise, the relationship to the      Lasix the patient has recently been placed on to treat her      edematous state is also questionable.  The patient does have a      generalized rash; whether or not there was an allergic interstitial      nephritis contributing versus acute tubular necrosis is also      questionable.  Will check a renal ultrasound of the patient's      kidneys for now.  We will also place a consultation with      nephrology. In light of the patient already being edematous in      general, I am hesitant to place the patient on any type of other      fluid  hydration at this particular time.  2.  Generalized rash.  The source of this is questionable.      relationship to antibiotics is questionable. Will consider holding      antibiotics temporarily until source of rash is identified.  3. Metabolic acidosis.  This may be secondary to renal failure.  Will      consider providing the patient with bicarbonate following      consultation with nephrology.  4. Recently diagnosed urinary tract infection.  Will repeat the      patient's UA, culture, and start the patient on empiric antibiotics      if these prove to be truly positive for a UTI. We will hold th      patient's ciprofloxacin for now given the fact that there is a      questionable relationship to the patient's current generalized      rash.  5. History of diabetes mellitus.  Will start the patient on Accu-Chek      and sliding scale insulin for now.  6. History of hypertension.  This currently appears to be stable. Will      monitor closely.  Will resume the patient's previously prescribed      anti-hypertensive medications.  7. Anemia.  This is possibly secondary to chronic disease, possibly      related to the patient's chronic renal failure. Will check an 8.      History of gout.  The patient currently has no complaints.  Will      monitor closely for now and hold allopurinol and colchicine for      now.  8. Small linear ulcer along the patient's sacral region.  Will      consider consultation with wound care.  9. Gastrointestinal prophylaxis.  Will provide Protonix.  10.Deep vein thrombosis prophylaxis.  Will provide Lovenox.      [name], M.D.  Electronically Signed     OR/MEDQ  D:  12/16/2006  T:  12/16/2006  Job:  [id number]   cc:   [name], M.D.

## 2010-09-22 NOTE — Discharge Summary (Signed)
NAMEGLENYS, SNADER NO.:  [id number]   MEDICAL RECORD NO.:  [id number]          PATIENT TYPE:  INP   LOCATION:  6733                         FACILITY:  [facility]   PHYSICIAN:  Andres Shad. Rudean Curt, MD     DATE OF BIRTH:  06/29/1932   DATE OF ADMISSION:  10/12/2006  DATE OF DISCHARGE:  10/20/2006                         DISCHARGE SUMMARY - REFERRING   ADDENDUM:  This summary covers the period of October 19, 2006, through the morning of  October 20, 2006.  The patient has received a better offer for short-term  skilled nursing facility placement for rehabilitation.  She will be  discharged in the morning.  There are no changes to her hospital course  or medications.      Andres Shad. Rudean Curt, MD  Electronically Signed     PML/MEDQ  D:  10/19/2006  T:  10/19/2006  Job:  [id number]

## 2010-09-22 NOTE — Discharge Summary (Signed)
[name], [name] NO.:  [id number]   MEDICAL RECORD NO.:  [id number]          PATIENT TYPE:  INP   LOCATION:  6711                         FACILITY:  [facility]   PHYSICIAN:  Michaelyn Barter, M.D. DATE OF BIRTH:  [birth year]   DATE OF ADMISSION:  12/16/2006  DATE OF DISCHARGE:                               DISCHARGE SUMMARY   DATE OF DISCHARGE:  Yet to be determined.   PRIMARY CARE DOCTOR:  Dr. Baltazar Najjar.   FINAL DIAGNOSES:  1. Acute on chronic renal insufficiency.  2. Probable sepsis with a urinary source.  3. Metabolic acidosis.  4. Generalized skin rash.  5. Urinary tract infection secondary to Klebsiella pneumonia as well      as yeast.  6. Hypotension.  7. Sacral ulcer.  8. Hypoalbumin.  9. Generalized edema.  10.Hypocalcemia.  11.Generalized weakness.  12.Leukocytosis.   CONSULTATIONS:  1. Pulmonary Critical Care with Dr. Craige Cotta.  2. Nephrology.  3. Dermatology with Dr. Amy Swaziland.   HISTORY OF PRESENT ILLNESS:  [name] is a 77 year old female who  resides at [facility].  It was reported that over the 1-2  weeks leading up to this admission Ms. Canupp had developed generalized  edema.  Lasix was provided, 60-80 mg per day.  There did not appear to  be any improvement in the patient's overall edematous state.  Routine  labs were drawn and the patient's BUN and creatinine were noted to have  increased.  As a result, the patient was brought to the hospital for  further evaluation of her rising BUN and creatinine.  The patient had  been treated previously with vancomycin and actually 4 days prior to  this admission the vancomycin was discontinued.  The patient had been on  vancomycin for at least 19 days.  The day after the vancomycin was  discontinued the patient began to develop an erythematous rash.  The day  after the rash began to develop the patient was brought to the hospital  for a followup chest x-ray.  She was diagnosed  with having a urinary  tract infection, and was started on ciprofloxacin and discharged back to  the nursing facility.   PAST MEDICAL HISTORY:  Please see that dictated by Dr. Michaelyn Barter.   HOSPITAL COURSE:  1. Acute on chronic renal insufficiency.  The patient's BUN was noted      to have been 56 with a creatinine of 5.3 at the time of her      presentation.  Nephrology was consulted.  The patient's BUN and      creatinine continue to be elevated.  2. Metabolic acidosis.  This most likely resulted from the patient's      renal issues.  The patient was started on an IV bicarbonate drip.      As a result, the patient's metabolic acidosis resolved.  3. Generalized rash.  When the patient initially was admitted into the      hospital she had an erythematous rash from her head down to her      feet.  There was some  slight scaly appearance to the patient's      rash.  She complained of some itching.  Over the course of the      patient's hospitalization the erythematous appearance has begun to      decline, however her skin has begun to desquamate extensively.  At      one point in time there was concern that the patient may have had      rocky mountain spotted fever, and one of the nephrology residents      started the patient on doxycycline.  Likewise, when the patient      first came in there was concern that she may have had a reaction to      one of the antibiotics that she was previously placed on, in      particular the vancomycin.  Dermatology was consulted on August      11th, Dr. Amy Swaziland saw the patient.  She indicated that the      patient was suffering from resolving dermatitis, no active      inflammation was seen.  She stated that the skin was sloughing off      secondary to previous inflammation.  It was unclear whether or not      this was secondary to a drug reaction or secondary to an infectious      process.  There is no evidence of rocky mountain spotted fever  at      this time.  She recommended that a triamcinolone 0.1% cream mixed      with Cetaphil cream 3:1 ratio b.i.d. be applied for 2 weeks to the      affected areas excluding the face.  After 2 weeks one could add      AmLactin cream once a day to b.i.d. p.r.n. to help with the      desquamation.  4. Probable sepsis secondary to urinary tract infection.  The patient      arrived with a white blood cell count of 25.  She was started on      ciprofloxacin.  She has had multiple blood cultures completed, all      of which have been negative.  A urine culture was completed on      August 6th and it grew Klebsiella pneumonia which was sensitive to      ciprofloxacin.  A later urine culture on August 9th grew greater      than 100,000 colonies of  yeast.  The patient has remained on      ciprofloxacin and Diflucan for several days.  5. Sacral skin tear.  The wound care nurse has been consulted.  6. Anemia.  The patient's hemoglobin was noted to have been 10.2 on      December 14, 2006.  It slowly began to drift downwards to a low of 7.9      by December 19, 2006.  The patient was transfused at least 1 unit of      packed RBCs.  The source of the patient's acute blood loss anemia      is questionable.  Stools have been ordered for occult blood, the      results of which are not back.  7. Hypotension.  When the patient first arrived into the hospital      plans were initially made to put her on the Telemetry Floor.      However, because of her renal failure an arterial blood gas was  completed.  The results of ABG revealed a pH of 7.166, a pCO2 of      26.2, a pO2 of 76.7, a bicarbonate of 9.1 and an O2 sat of 93.1.      The patient was transferred to the Step Down Area on 3300, Critical      Care was consulted and for the first few days of the patient's      hospitalization she was followed by the Pulmonary Critical Care      service.  A bicarbonate drip was started by Nephrology.  BiPAP was       provided to the patient and over the past several days the      patient's acidosis has improved.  The patient later transferred      from the Intensive Care Service on December 19, 2006.  Her blood      pressure has remained relatively stable over the past several days      of her hospitalization.  There were some concerns that the patient      may have been volume depleted; therefore, she received IV fluid      hydration secondary to her hypotension.  8. Hypoalbumin.  The source of this is questionable.  This could      possibly be related to the patient's renal disease.  This may need      to be further worked up.  9. Generalized edema.  Again, the relationship to the patient's renal      disease vs. her hypoalbumin is questionable.  The patient may be      third spacing secondary to her hypoalbumin.  This will need to be      monitored and possibly worked up further in the future.  10.Hypocalcemia.  The patient's last calcium level was noted to have      been 6.6 with albumin 1.9.  Calcium      supplementation has been ordered.  11.Generalized weakness.  The patient has been confined to her bed.      She appears to be very deconditioned, Physical Therapy will be      consulted regarding this.      Michaelyn Barter, M.D.  Electronically Signed     OR/MEDQ  D:  12/20/2006  T:  12/20/2006  Job:  [id number]   cc:   Maxwell Caul, M.D.

## 2010-09-22 NOTE — Op Note (Signed)
NAMEVIRGENE, [name]                ACCOUNT NO.:  [id number]   MEDICAL RECORD NO.:  [id number]          PATIENT TYPE:  AMB   LOCATION:  SDS                          FACILITY:  [facility]   PHYSICIAN:  Charles E. Fields, MD  DATE OF BIRTH:  1932/07/25   DATE OF PROCEDURE:  05/02/2007  DATE OF DISCHARGE:                               OPERATIVE REPORT   PROCEDURE:  Thrombectomy and revision, right upper arm arteriovenous  graft.   PREOPERATIVE DIAGNOSIS:  Thrombosed right upper arm arteriovenous graft.   POSTOPERATIVE DIAGNOSIS:  Thrombosed right upper arm arteriovenous  graft.  Same.   ANESTHESIA:  Local with IV sedation.   ASSISTANT:  Jerold Coombe, P.A.   OPERATIVE FINDINGS:  1. Redundant upper arm AV graft.  2. Revision of arterial anastomosis.  3. No significant venous narrowing.   OPERATIVE DETAILS:  After obtaining informed consent, the patient was  taken to the operating.  The patient was placed in the supine position  upon the operating room table.  After adequate sedation, the patient's  entire right upper extremity was prepped and draped in the usual sterile  fashion.  Local anesthesia was infiltrated at a preexisting longitudinal  scar up in the axilla.  The incision was reopened, carried down through  subcutaneous tissues down to the level of the AV graft.  The graft was  Graft was dissected free circumferentially.  There was some kinking of  the vein.  The graft was slightly redundant.  Next, a transverse  graftotomy was made just above the level of venous anastomosis.  A #4  Fogarty catheter was used to thrombectomize the venous limb of the  graft.  All thrombotic material was removed and there was good venous  backbleeding at this point.  The vein was then clamped proximally with  fine bulldog clamp.  The patient had been given 5000 units of heparin  prior to opening the graft.  Next the arterial limb of the graft was  thrombectomized with a #4 Fogarty catheter.   Multiple passes were made  until all thrombotic material was removed and there was arterial inflow  at this point.  The arterial inflow was fairly sluggish.  Next the  graftotomy was repaired using a running 6-0 Prolene suture.  A  shuntogram was then obtained with outflow occlusion to inspect the  arterial limb of the graft.  This showed retained thrombus at the  proximal portion of the graft and some mild kinking of the brachial  artery.  Next, local anesthesia was infiltrated at a longitudinal scar  near the antecubital region.  Incision was reopened.  However, the  arterial anastomosis was actually lower down at a transverse incision.  Therefore, this wound was closed with a running 3-0 Vicryl suture and  the skin closed with interrupted vertical mattress nylons.  Next the  antecubital transverse incision was reopened, carried down through the  subcutaneous tissues, and the proximal arterial limb of the graft was  dissected free circumferentially.  This was elevated up in the operative  field.  It was clamped proximal and distal just above  the anastomosis.  A transverse graftotomy was made.  There was retained thrombus, and this  was removed under direct vision.  There was good arterial inflow at this  point.  However, the arterial anastomosis became disrupted at this  point.  Therefore, a tourniquet was placed on the upper arm and this was  inflated to 300 mmHg.  After the tourniquet had been inflated, I was  able to dissect out the brachial artery proximal and distal to the  arteriotomy site and place vessel loops around this.  Fine bulldog  clamps were then used to control the artery.  Tourniquet was then  deflated.  The previous arterial anastomosis was then completely taken  down and the graft re-spatulated and sewn end of graft to side of artery  using a running 6-0 Prolene suture.  The artery was quite friable and  moderately calcified.  Just prior to completion of the  anastomosis,  everything was fore bled, back bled and thoroughly flushed.  Next the  clamp was moved up to the venous limb of the graft.  An approximately 3-  cm redundant segment was removed.  The graft was then re-sewn end-to-end  using a running 6-0 Prolene suture.  Just prior completion of the  anastomosis, this was fore bled, back bled and thoroughly flushed.  The  anastomosis was secured, clamps were released.  There was a good flow  into the graft immediately.  Flow into the vein was fairly pulsatile in  character.  The Doppler was used to inspect the radial artery and there  was good radial artery flow.  Next, all incisions were closed with  running 2-0 3-0 Vicryl sutures in multiple layers.  The skin of all  incisions then closed with vertical mattress interrupted nylon sutures.  The patient tolerated the procedure well and there were no  complications.  Instrument, sponge and needle count was correct at the  end of the case.  The patient was taken to the recovery room in stable  condition.      [name]. Fields, MD  Electronically Signed     CEF/MEDQ  D:  05/02/2007  T:  05/02/2007  Job:  [id number]

## 2010-09-25 NOTE — H&P (Signed)
[name], [name]                ACCOUNT NO.:  [id number]   MEDICAL RECORD NO.:  [id number]          PATIENT TYPE:  INP   LOCATION:  6712                         FACILITY:  [facility]   PHYSICIAN:  Deirdre Peer. Polite, M.D. DATE OF BIRTH:  [birth year]   DATE OF ADMISSION:  06/08/2004  DATE OF DISCHARGE:                                HISTORY & PHYSICAL   CHIEF COMPLAINT:  Diarrhea.   HISTORY OF PRESENT ILLNESS:  [name] is a pleasant, 77 year old female  with known history of hypertension, diabetes and iron deficiency anemia.  She was a direct admit through the ED on a couple weeks on outpatient basis  characterized with diarrhea every time she eats something.  Because of that,  the patient decreased her p.o. intake so that she would not have diarrhea.  She denies nay fever or chills.  She denies any chest pain, shortness of  breath, abdominal pain or new medications.  She just has diarrhea with no  blood.  Because of that, the patient presented to her primary M.D. for  evaluation.  Primary M.D. saw the patient and she appeared very, very tired  and dehydrated.  The patient had labs that showed significant leukocytosis  with 19,000 white count.  The patient was sent directly to the hospital for  further evaluation.  The patient had labs in the hospital confirming white  count of 18.9, hemoglobin 7.9, platelet count of 383.  Labs were significant  for a creatinine of 1.8.  Admission was deemed necessary for further  evaluation and treatment secondary to diarrhea leading to dehydration and  poor p.o. intake.   PAST MEDICAL HISTORY:  As stated above.   MEDICATIONS:  The patient is unsure of her medications.  Record shows that  she takes lisinopril 10 mg q.d., Lasix 20 mg b.i.d., clonidine 0.2 mg  b.i.d., Actos 15 mg q.d., Glipizide 10 mg two q.d., Metoprolol 100 mg  b.i.d., ferrous sulfate 325 mg t.i.d. and Imodium p.r.n.   SOCIAL HISTORY:  Negative for tobacco, alcohol or drugs.   PAST SURGICAL HISTORY:  Cyst removed from her uterus in the distant past,  probable ovarian cyst.   ALLERGIES:  No known drug allergies.   FAMILY HISTORY:  Mother deceased from CVA.  Father deceased secondary to  cancer, unknown type.  The patient has four brothers and one sister.  All  brothers are deceased of various causes she is not sure of.  Sister with  diabetes.   REVIEW OF SYSTEMS:  As stated in the HPI.   PHYSICAL EXAMINATION:  GENERAL:  The patient is alert and oriented x3 in no  apparent distress.  Just complaining of feeling weak.  HEENT:  Significant for pale sclerae, no oral lesions, no nodes or JVD.  CHEST:  Few bibasilar crackles.  CARDIAC:  Regular.  No S3.  ABDOMEN:  Soft, nontender, no hepatosplenomegaly.  EXTREMITIES:  No clubbing, cyanosis or edema.  Significant for pale nail  beds.  Significant problems with her knees and has trouble elevating her  legs secondary to significant DJD.  RECTAL:  Deferred.  NEUROLOGIC:  Nonfocal.   ASSESSMENT:  1.  Diarrhea.  Differential diagnoses includes infectious etiology versus      secretory.  2.  Malaise.  3.  Dehydration.  4.  Anemia.  The patient with history of iron deficiency anemia and has been      noncompliant with her iron.  5.  Diabetes.  6.  Hypertension.  7.  Mild hypokalemia.  8.  Azotemia, most likely secondary to diarrhea.   PLAN:  1.  The patient is being admitted to a medicine floor bed.  2.  The patient will be typed and crossed and transfused.  3.  The patient will be pancultured with urine and stool.  4.  Will probably start empiric antibiotics as the patient has significant      white count for urine and possible C. difficile.  5.  Will obtain a chest x-ray and provide the patient with antiemetics.  6.  Will check followup labs and make further recommendations as deemed      necessary.      RDP/MEDQ  D:  06/08/2004  T:  06/08/2004  Job:  [id number]

## 2010-09-25 NOTE — Discharge Summary (Signed)
[name], [name]                ACCOUNT NO.:  [id number]   MEDICAL RECORD NO.:  [id number]          PATIENT TYPE:  INP   LOCATION:  1610                         FACILITY:  [facility]   PHYSICIAN:  Theone Stanley, MD   DATE OF BIRTH:  1932/08/10   DATE OF ADMISSION:  06/08/2004  DATE OF DISCHARGE:  06/12/2004                                 DISCHARGE SUMMARY   ADMITTING DIAGNOSES:  1.  Diarrhea.  2.  Diabetes.  3.  Hypertension.  4.  Iron-deficiency anemia.   DISCHARGE DIAGNOSES:  1.  Diarrhea.  2.  Diabetes.  3.  Hypertension.  4.  Iron-deficiency anemia.  5.  Renal insufficiency.   CONSULTATIONS:  None.   PROCEDURES/DIAGNOSTIC TESTS:  None.   HOSPITAL COURSE:  1.  [name] is a very pleasant 77 year old African-American female with a      history of hypertension, diabetes and iron-deficiency anemia.  The      patient was admitted because of persistent diarrhea post consumption of      any type of food.  Because of this, she had decreased her p.o. intake so      that she would not have any diarrhea subsequently developing some      dehydration.  She was admitted for this reason.  On her admission, she      was started on ciprofloxacin and Flagyl, but attempts were made to      obtain stool cultures which was unsuccessful.  However, we were able to      obtain one set of Clostridium difficile which was negative.  TSH was      within normal range, however, it was discovered that she had a urinary      tract infection.  The patient was continued on the ciprofloxacin and      Flagyl which had resolved her diarrhea and she had improvement both in      this and in her p.o. intake.  The patient was Hemoccult negative during      this time.   1.  Iron-deficiency anemia which the patient has a history of.  A repeat      iron study confirmed this despite the fact that she was on iron      supplementation.  Vitamin C and folic acid was added to her regimen.      The patient  stated that she recently had a colonoscopy about two years      ago.  The patient continued to have iron-deficiency anemia and because      she had no evidence of acute bleed, it was felt that this could be      further worked up on an outpatient basis.   1.  Urinary tract infection.  The patient was infected with Escherichia coli      which was sensitive to ciprofloxacin.  She will have a total of seven      days of the ciprofloxacin.   1.  Diabetes.  Her oral hypoglycemics were on hold on her admission.  She      was  restarted on her glipizide while here in the hospital with      supplementation of insulin sliding scale.  Her sugars were reasonably      controlled.  A hemoglobin A1C showed that her blood sugars were      reasonably controlled on an outpatient basis.  It was 6.3.   1.  Hypertension.  The patient's medications were on hold on her admission,      however, while here in the hospital, her blood pressure did go up.  She      was restarted on some of her medications, metoprolol and Klonopin, but      her lisinopril was held secondary to some renal insufficiency.   1.  Renal insufficiency.  This is most likely secondary to the diarrhea.      After hydration, however, it appears that patient had some excessive      fluid on-board, therefore, she is being diuresed at this time.  We will      continue to follow her within the subacute area.   1.  Knee pain.  The patient complained multiple times of her knees hurting      her.  She has known osteoarthritis.  At that point in time, Dr. Renae Fickle was      contacted and they injected steroids in her knee which helped her knee      pain quite a bit.  Tylenol on a p.r.n. basis was given.   The patient left the hospital in stable condition.   DISCHARGE MEDICATIONS:  1.  Vitamin C 250 mg 1 p.o. b.i.d.  2.  Ciprofloxacin 250 mg 1 p.o. b.i.d. finish on February 5.  3.  Clonidine 0.2 mg 1 p.o. b.i.d.  4.  Iron sulfate 325 mg t.i.d.  5.   Lasix 40 mg 1 p.o. b.i.d. for one day and then subsequently 20 mg b.i.d.  6.  Glipizide 10 mg 1 p.o. daily.  7.  Insulin sliding scale.  8.  Lopressor 100 mg 1 p.o. b.i.d.  9.  Flagyl 50 mg 1 p.o. t.i.d.  10. Protonix 40 mg 1 p.o. daily.  11. Tylenol p.r.n.  12. Vicodin p.r.n.  13. Milk of Magnesia p.r.n.  14. Phenergan p.r.n.      AEJ/MEDQ  D:  06/12/2004  T:  06/12/2004  Job:  [id number]

## 2010-09-25 NOTE — H&P (Signed)
[name], [name]                ACCOUNT NO.:  [id number]   MEDICAL RECORD NO.:  [id number]          PATIENT TYPE:  ORB   LOCATION:  4526                         FACILITY:  [facility]   PHYSICIAN:  Theone Stanley, MD   DATE OF BIRTH:  January 14, 1933   DATE OF ADMISSION:  06/12/2004  DATE OF DISCHARGE:                                HISTORY & PHYSICAL   CHIEF COMPLAINT:  Diarrhea.   HISTORY OF PRESENT ILLNESS:  Please see recent discharge summary for full  details.  In short, [name] is a very pleasant, 77 year old, African-  American female with past medical history of diabetes, hypertension and iron  deficiency anemia who was recently admitted for diarrhea and poor oral  intake.  The patient was given Flagyl and Cipro.  Her symptoms had resolved.  It was noted that she did have a urinary tract infection during her stay  which is being treated by ciprofloxacin.  The only complicating issue is  that she had some renal insufficiency which was felt to be secondary to her  fluid status.  She will be diuresed while she is in the subacute unit.   PAST MEDICAL HISTORY:  See above.   MEDICATIONS:  Please see discharge summary.   SOCIAL HISTORY:  No tobacco, alcohol or illicit drug use.   PAST SURGICAL HISTORY:  Cyst removed from the uterus in the distant past.   ALLERGIES:  No known drug allergies.   FAMILY HISTORY:  Significant for CVA.   PHYSICAL EXAMINATION:  VITAL SIGNS:  Stable.  HEENT:  Atraumatic, normocephalic.  Oropharynx clear.  NECK:  Supple, no murmur or JVD.  HEART:  Regular rate and rhythm with no murmurs, rubs or gallops.  LUNGS:  Bibasilar crackles.  ABDOMEN:  Soft, nontender, nondistended.  EXTREMITIES:  No edema, cyanosis or clubbing.   ASSESSMENT:  1.  Diarrhea resolved.  Continue 10 day course of Flagyl for empiric      Clostridium difficile.  2.  Urinary tract infection.  The patient was positive for Escherichia coli      which was sensitive to Cipro.  We will  give a total of 7 days of      ciprofloxacin orally.  3.  Renal insufficiency.  We will watch this and gently diurese the patient      and follow her electrolytes along.  4.  Diabetes.  Will continue with the Glipizide and her other oral agents      once her renal insufficiency has resolved.  In the meantime, we will      supplement with insulin sliding scale.  5.  Hypertension.  The patient has been started on her metoprolol.  Will      hold off on restarting her lisinopril at this time because of her renal      insufficiency and watch this closely.  Once this issue has resolved,      will resume all her blood pressure medications.      AEJ/MEDQ  D:  06/12/2004  T:  06/12/2004  Job:  [id number]

## 2010-09-25 NOTE — Discharge Summary (Signed)
[name], [name]                ACCOUNT NO.:  [id number]   MEDICAL RECORD NO.:  [id number]          PATIENT TYPE:  ORB   LOCATION:  4526                         FACILITY:  [facility]   PHYSICIAN:  Corinna L. [name], MDDATE OF BIRTH:  December 10, 1932   DATE OF ADMISSION:  06/12/2004  DATE OF DISCHARGE:  06/23/2004                                 DISCHARGE SUMMARY   DIAGNOSES:  1.  Deconditioning.  2.  Resolved diarrhea.  3.  Uncontrolled hypertension.  4.  Chronic renal insufficiency.  5.  Type 2 diabetes.  6.  Chronic peripheral edema.  7.  Iron deficiency anemia.   DISCHARGE MEDICATIONS:  She is to stop lisinopril.  Start Norvasc 10 mg a  day.  Continue clonidine 0.2 mg p.o. b.i.d., glipizide 10 mg p.o. b.i.d.,  Lopressor 100 mg p.o. b.i.d., Actos 15 mg p.o. daily, iron supplementation  and Lasix 20 mg p.o. b.i.d.   FOLLOWUP:  She is to follow up with her primary care physician, Schuyler Amor, M.D., in two weeks for blood pressure check.   CONDITION ON DISCHARGE:  Stable.   DIET:  Should be diabetic, low salt.   ACTIVITY:  Ad lib.   HISTORY AND HOSPITAL COURSE:  [name] is a 77 year old black female who  was admitted to the subacute care unit for conditioning.  She had been  admitted to the hospital for several days prior for failure to thrive,  diarrhea and weakness.  The diarrhea had resolved.  The patient also had  been found to have a urinary tract infection.  She had been treated with  Flagyl and ciprofloxacin.  She received physical therapy and occupational  therapy.  At the time of discharge, she was able to ambulate independently  and was independent with her activities of daily living.   During her hospitalization, she had uncontrolled hypertension.  Her  lisinopril has been stopped due to the worsening renal insufficiency and the  patient was placed instead on Norvasc 10 mg.  I have written a prescription  for this.  At the time of discharge, her blood  pressure was 140/70.  The  patient had a normal exam, except for some chronic peripheral edema.  Please  see previous H&P and discharge summaries for further details.      CLS/MEDQ  D:  06/23/2004  T:  06/23/2004  Job:  [id number]   cc:   Schuyler Amor, M.D.  [address]  [place], [place] [address]  Fax: [phone]

## 2010-09-25 NOTE — Op Note (Signed)
[name], [name]                ACCOUNT NO.:  [id number]   MEDICAL RECORD NO.:  [id number]          PATIENT TYPE:  AMB   LOCATION:  DSC                          FACILITY:  [facility]   PHYSICIAN:  Thomas A. Cornett, M.D.DATE OF BIRTH:  1933/03/13   DATE OF PROCEDURE:  05/25/2006  DATE OF DISCHARGE:                               OPERATIVE REPORT   PREOPERATIVE DIAGNOSIS:  Right breast mass.   POSTOPERATIVE DIAGNOSIS:  Right breast mass.   PROCEDURE:  Right breast needle-localized excisional biopsy.   SURGEON:  Maisie Fus A. Cornett, M.D.   ANESTHESIA:  MAC, with approximately 25 cc of 0.25% Sensorcaine with  epinephrine local.   ESTIMATED BLOOD LOSS:  20 cc.   SPECIMEN:  Right breast tissue with localizing wire and preoperatively  placed clip by radiology to pathology.  Radiograph revealed the specimen  to be adequate.   DESCRIPTION OF PROCEDURE:  The patient is a 77 year old female found to  have a sclerosing papilloma by stereotactic core biopsy.  Excision was  recommended for further diagnosis.  This was discussed with the patient,  as well as procedure.  The risks were discussed and the patient agreed  to proceed, after discussion of the risks and benefits of the procedure.   DESCRIPTION OF PROCEDURE:  The patient was brought to the operating room  after right breast needle localization.  The right breast was prepped  and draped in a sterile fashion.  After initiation of MAC anesthesia,  local anesthesia was infiltrated under the skin just above the nipple.  An incision was made and the wire was located.  The tissue around the  wire was dissected out circumferentially.  Radiograph revealed that the  clip and the localizing wire with the biopsy site was adequate and in  the specimen.  I then used 3-0 Vicryl to oversew some small bleeding  vessels.  I then closed the wound with 3-0 Vicryl for deep tissue  closure and 4-0 Monocryl for the skin.  All final counts of sponge,  needle and instruments were found to be correct at this portion of the  case.  Sterile dressings were applied.  The patient was then awakened,  taken to recovery in satisfactory condition.  All final counts of  sponge, needle and instruments were found be correct at this portion of  the case.      Thomas A. Cornett, M.D.  Electronically Signed    TAC/MEDQ  D:  05/25/2006  T:  05/25/2006  Job:  [id number]   cc:   Samul Dada, M.D.

## 2010-10-01 ENCOUNTER — Ambulatory Visit (HOSPITAL_COMMUNITY)
Admission: RE | Admit: 2010-10-01 | Payer: Medicare Other | Source: Ambulatory Visit | Admitting: Obstetrics and Gynecology

## 2010-10-01 ENCOUNTER — Other Ambulatory Visit (HOSPITAL_COMMUNITY): Payer: Self-pay | Admitting: Nephrology

## 2010-10-01 DIAGNOSIS — T8249XA Other complication of vascular dialysis catheter, initial encounter: Secondary | ICD-10-CM

## 2010-10-02 ENCOUNTER — Other Ambulatory Visit (HOSPITAL_COMMUNITY): Payer: Self-pay | Admitting: Nephrology

## 2010-10-02 ENCOUNTER — Ambulatory Visit (HOSPITAL_COMMUNITY)
Admission: RE | Admit: 2010-10-02 | Discharge: 2010-10-02 | Disposition: A | Discharge status: Home / Self Care | Payer: Medicare Other | Source: Ambulatory Visit | Attending: Nephrology | Admitting: Nephrology

## 2010-10-02 DIAGNOSIS — Y832 Surgical operation with anastomosis, bypass or graft as the cause of abnormal reaction of the patient, or of later complication, without mention of misadventure at the time of the procedure: Secondary | ICD-10-CM | POA: Insufficient documentation

## 2010-10-02 DIAGNOSIS — T82898A Other specified complication of vascular prosthetic devices, implants and grafts, initial encounter: Secondary | ICD-10-CM | POA: Insufficient documentation

## 2010-10-02 DIAGNOSIS — T8249XA Other complication of vascular dialysis catheter, initial encounter: Secondary | ICD-10-CM

## 2010-10-02 DIAGNOSIS — N186 End stage renal disease: Secondary | ICD-10-CM | POA: Insufficient documentation

## 2010-10-02 LAB — BASIC METABOLIC PANEL
BUN: 77 mg/dL — ABNORMAL HIGH (ref 6–23)
CO2: 22 mEq/L (ref 19–32)
Calcium: 9.2 mg/dL (ref 8.4–10.5)
GFR calc non Af Amer: 3 mL/min — ABNORMAL LOW (ref >60)
Glucose, Bld: 107 mg/dL — ABNORMAL HIGH (ref 70–99)

## 2010-10-02 MED ORDER — IOHEXOL 300 MG/ML  SOLN
100.0000 mL | Freq: Once | INTRAMUSCULAR | Status: AC | PRN
  Administered 2010-10-02: 50 mL via INTRAVENOUS

## 2010-10-27 ENCOUNTER — Other Ambulatory Visit (HOSPITAL_COMMUNITY): Payer: Self-pay | Admitting: Nephrology

## 2010-10-27 ENCOUNTER — Ambulatory Visit (HOSPITAL_COMMUNITY)
Admission: RE | Admit: 2010-10-27 | Discharge: 2010-10-27 | Disposition: A | Discharge status: Home / Self Care | Payer: Medicare Other | Source: Ambulatory Visit | Attending: Nephrology | Admitting: Nephrology

## 2010-10-27 DIAGNOSIS — T8249XA Other complication of vascular dialysis catheter, initial encounter: Secondary | ICD-10-CM

## 2010-10-27 DIAGNOSIS — Y832 Surgical operation with anastomosis, bypass or graft as the cause of abnormal reaction of the patient, or of later complication, without mention of misadventure at the time of the procedure: Secondary | ICD-10-CM | POA: Insufficient documentation

## 2010-10-27 DIAGNOSIS — T82898A Other specified complication of vascular prosthetic devices, implants and grafts, initial encounter: Secondary | ICD-10-CM | POA: Insufficient documentation

## 2010-10-27 MED ORDER — IOHEXOL 300 MG/ML  SOLN
100.0000 mL | Freq: Once | INTRAMUSCULAR | Status: AC | PRN
  Administered 2010-10-27: 50 mL via INTRAVENOUS

## 2010-11-25 ENCOUNTER — Encounter: Payer: Self-pay | Admitting: Gastroenterology

## 2010-12-03 ENCOUNTER — Other Ambulatory Visit: Payer: Self-pay | Admitting: Family Medicine

## 2010-12-03 DIAGNOSIS — Z1231 Encounter for screening mammogram for malignant neoplasm of breast: Secondary | ICD-10-CM

## 2010-12-04 ENCOUNTER — Ambulatory Visit
Admission: RE | Admit: 2010-12-04 | Discharge: 2010-12-04 | Disposition: A | Discharge status: Home / Self Care | Payer: Medicare Other | Source: Ambulatory Visit | Attending: Family Medicine | Admitting: Family Medicine

## 2010-12-04 DIAGNOSIS — Z1231 Encounter for screening mammogram for malignant neoplasm of breast: Secondary | ICD-10-CM

## 2011-01-01 ENCOUNTER — Ambulatory Visit: Payer: Medicare Other | Admitting: Gastroenterology

## 2011-02-12 LAB — POCT I-STAT 4, (NA,K, GLUC, HGB,HCT)
Glucose, Bld: 165 — ABNORMAL HIGH
HCT: 47 — ABNORMAL HIGH
HCT: 47 — ABNORMAL HIGH
Hemoglobin: 15.3 — ABNORMAL HIGH
Hemoglobin: 16 — ABNORMAL HIGH
Operator id: 274481
Potassium: 3.5
Potassium: 3.7
Sodium: 139

## 2011-02-12 LAB — DIFFERENTIAL
Eosinophils Absolute: 0.8 — ABNORMAL HIGH
Lymphocytes Relative: 10 — ABNORMAL LOW
Lymphs Abs: 2.1
Monocytes Relative: 4
Neutro Abs: 17.1 — ABNORMAL HIGH
Neutrophils Relative %: 82 — ABNORMAL HIGH

## 2011-02-12 LAB — BASIC METABOLIC PANEL
Chloride: 100
GFR calc Af Amer: 12 — ABNORMAL LOW
GFR calc non Af Amer: 10 — ABNORMAL LOW
Potassium: 3.9
Sodium: 138

## 2011-02-12 LAB — CBC
HCT: 41.7
MCV: 87.3
Platelets: 418 — ABNORMAL HIGH
RBC: 4.78
WBC: 21 — ABNORMAL HIGH

## 2011-02-16 LAB — POCT I-STAT 4, (NA,K, GLUC, HGB,HCT)
Glucose, Bld: 177 — ABNORMAL HIGH
HCT: 53 — ABNORMAL HIGH
Hemoglobin: 18 — ABNORMAL HIGH
Operator id: 181601
Potassium: 3.5
Sodium: 135
Sodium: 135

## 2011-02-19 LAB — DIFFERENTIAL
Basophils Absolute: 0
Basophils Absolute: 0
Basophils Absolute: 0
Basophils Absolute: 0.1
Basophils Relative: 1
Basophils Relative: 1
Eosinophils Absolute: 0.3
Eosinophils Absolute: 0.7
Eosinophils Absolute: 0.8 — ABNORMAL HIGH
Eosinophils Relative: 4
Eosinophils Relative: 4
Lymphocytes Relative: 3 — ABNORMAL LOW
Lymphocytes Relative: 4 — ABNORMAL LOW
Lymphocytes Relative: 4 — ABNORMAL LOW
Lymphocytes Relative: 6 — ABNORMAL LOW
Lymphs Abs: 1.2
Lymphs Abs: 1.4
Lymphs Abs: 1.5
Monocytes Absolute: 1.3 — ABNORMAL HIGH
Monocytes Absolute: 1.4 — ABNORMAL HIGH
Monocytes Absolute: 1.7 — ABNORMAL HIGH
Monocytes Relative: 6
Monocytes Relative: 9
Monocytes Relative: 9
Neutro Abs: 19.8 — ABNORMAL HIGH
Neutro Abs: 19.8 — ABNORMAL HIGH
Neutro Abs: 20.9 — ABNORMAL HIGH
Neutrophils Relative %: 78 — ABNORMAL HIGH
Neutrophils Relative %: 80 — ABNORMAL HIGH

## 2011-02-19 LAB — CROSSMATCH

## 2011-02-19 LAB — URINE CULTURE: Special Requests: NEGATIVE

## 2011-02-19 LAB — CREATININE CLEARANCE, URINE, 24 HOUR
Collection Interval-CRCL: 24
Creatinine: 4.24 — ABNORMAL HIGH
Urine Total Volume-CRCL: 3750

## 2011-02-19 LAB — RENAL FUNCTION PANEL
Albumin: 1.8 — ABNORMAL LOW
Albumin: 1.9 — ABNORMAL LOW
Albumin: 2.1 — ABNORMAL LOW
BUN: 22
CO2: 29
CO2: 31
Calcium: 7.6 — ABNORMAL LOW
Calcium: 8 — ABNORMAL LOW
Calcium: 8.6
Chloride: 101
Chloride: 104
Chloride: 105
Creatinine, Ser: 2.37 — ABNORMAL HIGH
Creatinine, Ser: 2.88 — ABNORMAL HIGH
GFR calc Af Amer: 14 — ABNORMAL LOW
GFR calc Af Amer: 14 — ABNORMAL LOW
GFR calc Af Amer: 16 — ABNORMAL LOW
GFR calc Af Amer: 19 — ABNORMAL LOW
GFR calc Af Amer: 24 — ABNORMAL LOW
GFR calc non Af Amer: 11 — ABNORMAL LOW
GFR calc non Af Amer: 12 — ABNORMAL LOW
GFR calc non Af Amer: 13 — ABNORMAL LOW
GFR calc non Af Amer: 16 — ABNORMAL LOW
GFR calc non Af Amer: 20 — ABNORMAL LOW
Glucose, Bld: 50 — ABNORMAL LOW
Phosphorus: 3.1
Potassium: 3.8
Potassium: 4.3
Potassium: 4.4
Sodium: 140
Sodium: 141
Sodium: 142
Sodium: 142
Sodium: 144

## 2011-02-19 LAB — COMPREHENSIVE METABOLIC PANEL
ALT: 12
AST: 12
Alkaline Phosphatase: 136 — ABNORMAL HIGH
Alkaline Phosphatase: 140 — ABNORMAL HIGH
BUN: 14
CO2: 30
Calcium: 8 — ABNORMAL LOW
Chloride: 103
Creatinine, Ser: 1.85 — ABNORMAL HIGH
GFR calc Af Amer: 20 — ABNORMAL LOW
GFR calc non Af Amer: 27 — ABNORMAL LOW
Glucose, Bld: 192 — ABNORMAL HIGH
Potassium: 3.8
Potassium: 4.1
Sodium: 139
Total Bilirubin: 0.6
Total Protein: 6

## 2011-02-19 LAB — UIFE/LIGHT CHAINS/TP QN, 24-HR UR
Albumin, U: DETECTED
Alpha 1, Urine: DETECTED — AB
Free Kappa/Lambda Ratio: 6.57 — ABNORMAL HIGH (ref 0.46–4.00)
Free Lambda Excretion/Day: 23.63
Time: 24
Volume, Urine: 3750

## 2011-02-19 LAB — CBC
HCT: 30.4 — ABNORMAL LOW
HCT: 32.5 — ABNORMAL LOW
Hemoglobin: 10.8 — ABNORMAL LOW
Hemoglobin: 10.8 — ABNORMAL LOW
Hemoglobin: 11.1 — ABNORMAL LOW
Hemoglobin: 9.3 — ABNORMAL LOW
Hemoglobin: 9.9 — ABNORMAL LOW
Hemoglobin: 9.9 — ABNORMAL LOW
MCHC: 31.9
MCHC: 31.9
MCHC: 32.1
MCHC: 32.2
MCHC: 32.3
MCV: 82.3
MCV: 82.9
MCV: 83.4
MCV: 83.5
Platelets: 408 — ABNORMAL HIGH
Platelets: 416 — ABNORMAL HIGH
Platelets: 439 — ABNORMAL HIGH
Platelets: 452 — ABNORMAL HIGH
Platelets: 481 — ABNORMAL HIGH
Platelets: 485 — ABNORMAL HIGH
Platelets: 496 — ABNORMAL HIGH
RBC: 3.03 — ABNORMAL LOW
RBC: 3.66 — ABNORMAL LOW
RBC: 3.67 — ABNORMAL LOW
RBC: 4.06
RBC: 4.12
RBC: 4.16
RDW: 16.7 — ABNORMAL HIGH
RDW: 17.1 — ABNORMAL HIGH
RDW: 17.5 — ABNORMAL HIGH
RDW: 17.6 — ABNORMAL HIGH
RDW: 17.9 — ABNORMAL HIGH
RDW: 17.9 — ABNORMAL HIGH
RDW: 18.1 — ABNORMAL HIGH
RDW: 18.5 — ABNORMAL HIGH
WBC: 15.4 — ABNORMAL HIGH
WBC: 17.7 — ABNORMAL HIGH
WBC: 20.1 — ABNORMAL HIGH
WBC: 21.9 — ABNORMAL HIGH
WBC: 22.6 — ABNORMAL HIGH
WBC: 23.4 — ABNORMAL HIGH
WBC: 25.5 — ABNORMAL HIGH

## 2011-02-19 LAB — BLOOD GAS, ARTERIAL
FIO2: 0.35
O2 Saturation: 89.7
pCO2 arterial: 41.8
pO2, Arterial: 55.4 — ABNORMAL LOW

## 2011-02-19 LAB — PROTEIN, URINE, 24 HOUR
Collection Interval-UPROT: 24
Urine Total Volume-UPROT: 3750

## 2011-02-19 LAB — BASIC METABOLIC PANEL
BUN: 40 — ABNORMAL HIGH
BUN: 42 — ABNORMAL HIGH
CO2: 25
CO2: 31
Calcium: 7.6 — ABNORMAL LOW
Chloride: 103
Chloride: 105
Creatinine, Ser: 3.33 — ABNORMAL HIGH
Creatinine, Ser: 4.24 — ABNORMAL HIGH
GFR calc non Af Amer: 12 — ABNORMAL LOW
Glucose, Bld: 250 — ABNORMAL HIGH
Glucose, Bld: 98
Potassium: 3.7
Potassium: 4.2
Sodium: 140

## 2011-02-19 LAB — CLOSTRIDIUM DIFFICILE EIA
C difficile Toxins A+B, EIA: NEGATIVE
C difficile Toxins A+B, EIA: NEGATIVE

## 2011-02-19 LAB — PROTEIN ELECTROPH W RFLX QUANT IMMUNOGLOBULINS
Albumin ELP: 37.3 — ABNORMAL LOW
Beta 2: 6.9 — ABNORMAL HIGH
Beta Globulin: 3.6 — ABNORMAL LOW
M-Spike, %: 0.17

## 2011-02-19 LAB — CULTURE, BLOOD (ROUTINE X 2): Culture: NO GROWTH

## 2011-02-19 LAB — IGG, IGA, IGM: IgG (Immunoglobin G), Serum: 1320

## 2011-02-19 LAB — IMMUNOFIXATION ADD-ON

## 2011-02-19 LAB — HEPATITIS B SURFACE ANTIGEN: Hepatitis B Surface Ag: NEGATIVE

## 2011-02-22 LAB — DIFFERENTIAL
Basophils Absolute: 0
Basophils Relative: 0
Basophils Relative: 0
Eosinophils Absolute: 0
Eosinophils Absolute: 0.8 — ABNORMAL HIGH
Eosinophils Relative: 0
Eosinophils Relative: 3
Eosinophils Relative: 4
Lymphs Abs: 0.8
Lymphs Abs: 0.9
Metamyelocytes Relative: 1
Monocytes Absolute: 0.6
Monocytes Absolute: 0.8 — ABNORMAL HIGH
Monocytes Relative: 3
Monocytes Relative: 6
Myelocytes: 1
Neutro Abs: 22.5 — ABNORMAL HIGH
Neutrophils Relative %: 85 — ABNORMAL HIGH
Neutrophils Relative %: 90 — ABNORMAL HIGH

## 2011-02-22 LAB — IRON AND TIBC
Iron: 26 — ABNORMAL LOW
Iron: 61
Saturation Ratios: 29
TIBC: 213 — ABNORMAL LOW
UIBC: 60

## 2011-02-22 LAB — POCT I-STAT 3, VENOUS BLOOD GAS (G3P V)
O2 Saturation: 57
TCO2: 24
pCO2, Ven: 37.1 — ABNORMAL LOW
pO2, Ven: 29 — CL

## 2011-02-22 LAB — BLOOD GAS, ARTERIAL
Acid-Base Excess: 5.5 — ABNORMAL HIGH
Acid-base deficit: 17.9 — ABNORMAL HIGH
Bicarbonate: 9.1 — ABNORMAL LOW
O2 Content: 3
O2 Content: 4
O2 Saturation: 91
O2 Saturation: 93.1
O2 Saturation: 95.1
Patient temperature: 98.4
Patient temperature: 98.6
Patient temperature: 98.6
TCO2: 9.9
pO2, Arterial: 57.8 — ABNORMAL LOW

## 2011-02-22 LAB — URINALYSIS, ROUTINE W REFLEX MICROSCOPIC
Glucose, UA: NEGATIVE
Nitrite: NEGATIVE
Protein, ur: NEGATIVE
Specific Gravity, Urine: 1.019
pH: 5
pH: 5

## 2011-02-22 LAB — CULTURE, BLOOD (ROUTINE X 2)
Culture: NO GROWTH
Culture: NO GROWTH

## 2011-02-22 LAB — COMPREHENSIVE METABOLIC PANEL
AST: 8
Albumin: 2 — ABNORMAL LOW
Alkaline Phosphatase: 96
BUN: 67 — ABNORMAL HIGH
CO2: 11 — ABNORMAL LOW
Chloride: 111
Creatinine, Ser: 6.24 — ABNORMAL HIGH
GFR calc Af Amer: 8 — ABNORMAL LOW
GFR calc non Af Amer: 7 — ABNORMAL LOW
Potassium: 5.5 — ABNORMAL HIGH
Total Bilirubin: 0.9

## 2011-02-22 LAB — CBC
HCT: 25.9 — ABNORMAL LOW
HCT: 28.6 — ABNORMAL LOW
HCT: 31.5 — ABNORMAL LOW
HCT: 32.3 — ABNORMAL LOW
HCT: 34.7 — ABNORMAL LOW
HCT: 25.8 — ABNORMAL LOW
HCT: 28.5 — ABNORMAL LOW
Hemoglobin: 7.9 — CL
Hemoglobin: 8.8 — ABNORMAL LOW
Hemoglobin: 9 — ABNORMAL LOW
Hemoglobin: 9.8 — ABNORMAL LOW
Hemoglobin: 9 — ABNORMAL LOW
Hemoglobin: 9.8 — ABNORMAL LOW
MCHC: 30.7
MCHC: 30.7
MCHC: 31.5
MCHC: 31.6
MCHC: 31.8
MCHC: 31.8
MCHC: 32
MCHC: 32.2
MCHC: 32.2
MCHC: 31.6
MCHC: 31.8
MCV: 78.2
MCV: 79.4
MCV: 79.6
MCV: 79.7
MCV: 80.6
MCV: 81
MCV: 82.6
MCV: 82.9
MCV: 82.9
MCV: 80.8
Platelets: 384
Platelets: 391
Platelets: 409 — ABNORMAL HIGH
Platelets: 456 — ABNORMAL HIGH
Platelets: 387
Platelets: 392
RBC: 3.13 — ABNORMAL LOW
RBC: 3.55 — ABNORMAL LOW
RBC: 3.56 — ABNORMAL LOW
RBC: 3.93
RBC: 4.18
RBC: 3.86 — ABNORMAL LOW
RDW: 17.8 — ABNORMAL HIGH
RDW: 17.9 — ABNORMAL HIGH
RDW: 19.3 — ABNORMAL HIGH
RDW: 19.9 — ABNORMAL HIGH
RDW: 19.7 — ABNORMAL HIGH
RDW: 20.3 — ABNORMAL HIGH
WBC: 16.2 — ABNORMAL HIGH
WBC: 20.1 — ABNORMAL HIGH
WBC: 20.2 — ABNORMAL HIGH
WBC: 20.9 — ABNORMAL HIGH
WBC: 23.8 — ABNORMAL HIGH
WBC: 12.2 — ABNORMAL HIGH

## 2011-02-22 LAB — RENAL FUNCTION PANEL
Albumin: 1.9 — ABNORMAL LOW
BUN: 45 — ABNORMAL HIGH
BUN: 50 — ABNORMAL HIGH
BUN: 57 — ABNORMAL HIGH
BUN: 65 — ABNORMAL HIGH
BUN: 65 — ABNORMAL HIGH
BUN: 73 — ABNORMAL HIGH
BUN: 33 — ABNORMAL HIGH
CO2: 14 — ABNORMAL LOW
CO2: 29
CO2: 29
CO2: 31
CO2: 16 — ABNORMAL LOW
Calcium: 6.6 — ABNORMAL LOW
Calcium: 6.6 — ABNORMAL LOW
Calcium: 6.8 — ABNORMAL LOW
Calcium: 7 — ABNORMAL LOW
Calcium: 7.2 — ABNORMAL LOW
Chloride: 109
Chloride: 110
Chloride: 113 — ABNORMAL HIGH
Creatinine, Ser: 4.42 — ABNORMAL HIGH
Creatinine, Ser: 5.33 — ABNORMAL HIGH
Creatinine, Ser: 5.65 — ABNORMAL HIGH
Creatinine, Ser: 5.91 — ABNORMAL HIGH
Creatinine, Ser: 5.96 — ABNORMAL HIGH
GFR calc non Af Amer: 7 — ABNORMAL LOW
Glucose, Bld: 133 — ABNORMAL HIGH
Glucose, Bld: 156 — ABNORMAL HIGH
Glucose, Bld: 194 — ABNORMAL HIGH
Glucose, Bld: 212 — ABNORMAL HIGH
Glucose, Bld: 91
Glucose, Bld: 94
Glucose, Bld: 165 — ABNORMAL HIGH
Phosphorus: 4.5
Phosphorus: 4.6
Phosphorus: 4.9 — ABNORMAL HIGH
Potassium: 3.8
Potassium: 5.7 — ABNORMAL HIGH
Potassium: 4.6
Sodium: 145
Sodium: 148 — ABNORMAL HIGH
Sodium: 140

## 2011-02-22 LAB — URINE MICROSCOPIC-ADD ON

## 2011-02-22 LAB — URINE CULTURE: Colony Count: >=100000

## 2011-02-22 LAB — CROSSMATCH

## 2011-02-22 LAB — POCT I-STAT 3, ART BLOOD GAS (G3+)
Acid-base deficit: 14 — ABNORMAL HIGH
Acid-base deficit: 18 — ABNORMAL HIGH
Bicarbonate: 12.4 — ABNORMAL LOW
Bicarbonate: 8.2 — ABNORMAL LOW
O2 Saturation: 44 — ABNORMAL LOW
O2 Saturation: 57
O2 Saturation: 99
Patient temperature: 97.1
Patient temperature: 98.7
TCO2: 12
pCO2 arterial: 21.3 — ABNORMAL LOW
pCO2 arterial: 29.8 — ABNORMAL LOW
pH, Arterial: 7.187 — CL
pO2, Arterial: 142 — ABNORMAL HIGH
pO2, Arterial: 27 — CL

## 2011-02-22 LAB — BASIC METABOLIC PANEL
BUN: 50 — ABNORMAL HIGH
BUN: 63 — ABNORMAL HIGH
BUN: 34 — ABNORMAL HIGH
CO2: 15 — ABNORMAL LOW
CO2: 18 — ABNORMAL LOW
Calcium: 8.3 — ABNORMAL LOW
Chloride: 110
Chloride: 113 — ABNORMAL HIGH
Creatinine, Ser: 2.72 — ABNORMAL HIGH
GFR calc non Af Amer: 17 — ABNORMAL LOW
Glucose, Bld: 213 — ABNORMAL HIGH
Glucose, Bld: 73
Glucose, Bld: 57 — ABNORMAL LOW
Potassium: 4.5
Potassium: 4.6
Potassium: 4.8

## 2011-02-22 LAB — LACTIC ACID, PLASMA: Lactic Acid, Venous: 0.9

## 2011-02-22 LAB — FERRITIN
Ferritin: 1076 — ABNORMAL HIGH (ref 10–291)
Ferritin: 1276 — ABNORMAL HIGH (ref 10–291)

## 2011-02-22 LAB — VANCOMYCIN, TROUGH: Vancomycin Tr: 20.4 — ABNORMAL HIGH

## 2011-02-22 LAB — APTT: aPTT: 25

## 2011-02-22 LAB — D-DIMER, QUANTITATIVE: D-Dimer, Quant: 1.52 — ABNORMAL HIGH

## 2011-02-22 LAB — SEDIMENTATION RATE: Sed Rate: 17

## 2011-02-23 LAB — URINALYSIS, ROUTINE W REFLEX MICROSCOPIC
Glucose, UA: NEGATIVE
Nitrite: NEGATIVE
Protein, ur: 30 — AB
pH: 5

## 2011-02-23 LAB — CLOSTRIDIUM DIFFICILE EIA
C difficile Toxins A+B, EIA: NEGATIVE
C difficile Toxins A+B, EIA: NEGATIVE
C difficile Toxins A+B, EIA: NEGATIVE

## 2011-02-23 LAB — CBC
HCT: 27.4 — ABNORMAL LOW
HCT: 28.8 — ABNORMAL LOW
HCT: 31.6 — ABNORMAL LOW
HCT: 32.6 — ABNORMAL LOW
HCT: 33 — ABNORMAL LOW
Hemoglobin: 10 — ABNORMAL LOW
Hemoglobin: 10.3 — ABNORMAL LOW
Hemoglobin: 10.5 — ABNORMAL LOW
Hemoglobin: 9.1 — ABNORMAL LOW
MCHC: 30.6
MCHC: 31.4
MCHC: 31.4
MCHC: 31.5
MCHC: 31.6
MCHC: 31.6
MCHC: 31.9
MCV: 76.9 — ABNORMAL LOW
MCV: 77.8 — ABNORMAL LOW
MCV: 79.6
MCV: 80.7
MCV: 80.8
MCV: 81.2
Platelets: 421 — ABNORMAL HIGH
Platelets: 437 — ABNORMAL HIGH
Platelets: 532 — ABNORMAL HIGH
Platelets: 534 — ABNORMAL HIGH
Platelets: 536 — ABNORMAL HIGH
RBC: 3.27 — ABNORMAL LOW
RBC: 3.56 — ABNORMAL LOW
RBC: 3.56 — ABNORMAL LOW
RBC: 3.61 — ABNORMAL LOW
RBC: 3.97
RBC: 4.03
RDW: 18 — ABNORMAL HIGH
RDW: 18.2 — ABNORMAL HIGH
RDW: 19.6 — ABNORMAL HIGH
RDW: 19.8 — ABNORMAL HIGH
RDW: 20.4 — ABNORMAL HIGH
WBC: 19.2 — ABNORMAL HIGH
WBC: 19.9 — ABNORMAL HIGH
WBC: 21.3 — ABNORMAL HIGH
WBC: 22.6 — ABNORMAL HIGH
WBC: 26.1 — ABNORMAL HIGH

## 2011-02-23 LAB — BASIC METABOLIC PANEL
BUN: 45 — ABNORMAL HIGH
BUN: 53 — ABNORMAL HIGH
CO2: 18 — ABNORMAL LOW
CO2: 23
Calcium: 8 — ABNORMAL LOW
Calcium: 8.2 — ABNORMAL LOW
Chloride: 111
Chloride: 117 — ABNORMAL HIGH
Creatinine, Ser: 2.96 — ABNORMAL HIGH
Creatinine, Ser: 3.39 — ABNORMAL HIGH
GFR calc Af Amer: 16 — ABNORMAL LOW
GFR calc Af Amer: 19 — ABNORMAL LOW
GFR calc non Af Amer: 13 — ABNORMAL LOW
GFR calc non Af Amer: 16 — ABNORMAL LOW
Glucose, Bld: 103 — ABNORMAL HIGH
Glucose, Bld: 149 — ABNORMAL HIGH
Potassium: 3.9
Potassium: 4.7
Sodium: 142
Sodium: 142

## 2011-02-23 LAB — PHOSPHORUS
Phosphorus: 5 — ABNORMAL HIGH
Phosphorus: 5.1 — ABNORMAL HIGH
Phosphorus: 5.1 — ABNORMAL HIGH

## 2011-02-23 LAB — CROSSMATCH
ABO/RH(D): O POS
Antibody Screen: NEGATIVE

## 2011-02-23 LAB — TSH: TSH: 2.692

## 2011-02-23 LAB — BLOOD GAS, ARTERIAL
Acid-base deficit: 4.5 — ABNORMAL HIGH
Bicarbonate: 19.6 — ABNORMAL LOW
Bicarbonate: 20.3
O2 Content: 4
O2 Saturation: 87.5
Patient temperature: 98.6
TCO2: 20.6
TCO2: 21.3
pCO2 arterial: 33.2 — ABNORMAL LOW
pO2, Arterial: 58.8 — ABNORMAL LOW

## 2011-02-23 LAB — DIFFERENTIAL
Basophils Absolute: 0
Basophils Absolute: 0
Basophils Absolute: 0
Basophils Relative: 0
Basophils Relative: 0
Eosinophils Absolute: 0.4
Eosinophils Absolute: 0.6
Eosinophils Relative: 2
Eosinophils Relative: 3
Eosinophils Relative: 3
Lymphocytes Relative: 3 — ABNORMAL LOW
Lymphocytes Relative: 5 — ABNORMAL LOW
Lymphocytes Relative: 6 — ABNORMAL LOW
Lymphs Abs: 0.7
Lymphs Abs: 1
Lymphs Abs: 1
Lymphs Abs: 1.3
Monocytes Absolute: 1 — ABNORMAL HIGH
Monocytes Absolute: 1.3 — ABNORMAL HIGH
Monocytes Absolute: 1.4 — ABNORMAL HIGH
Monocytes Relative: 3
Monocytes Relative: 4
Monocytes Relative: 6
Monocytes Relative: 7
Neutro Abs: 16.9 — ABNORMAL HIGH
Neutro Abs: 18.3 — ABNORMAL HIGH
Neutro Abs: 23.3 — ABNORMAL HIGH
Neutrophils Relative %: 85 — ABNORMAL HIGH
Neutrophils Relative %: 86 — ABNORMAL HIGH
Neutrophils Relative %: 93 — ABNORMAL HIGH

## 2011-02-23 LAB — COMPREHENSIVE METABOLIC PANEL
ALT: 38 — ABNORMAL HIGH
AST: 24
Albumin: 1.8 — ABNORMAL LOW
Alkaline Phosphatase: 135 — ABNORMAL HIGH
BUN: 59 — ABNORMAL HIGH
BUN: 72 — ABNORMAL HIGH
BUN: 77 — ABNORMAL HIGH
CO2: 20
CO2: 21
Calcium: 8.4
Calcium: 8.4
Chloride: 106
Chloride: 111
Creatinine, Ser: 3.58 — ABNORMAL HIGH
Creatinine, Ser: 3.92 — ABNORMAL HIGH
GFR calc Af Amer: 14 — ABNORMAL LOW
GFR calc non Af Amer: 11 — ABNORMAL LOW
GFR calc non Af Amer: 13 — ABNORMAL LOW
Glucose, Bld: 102 — ABNORMAL HIGH
Glucose, Bld: 192 — ABNORMAL HIGH
Glucose, Bld: 60 — ABNORMAL LOW
Potassium: 4.2
Sodium: 141
Total Bilirubin: 0.9
Total Protein: 6.5
Total Protein: 6.6

## 2011-02-23 LAB — EXPECTORATED SPUTUM ASSESSMENT W REFEX TO RESP CULTURE

## 2011-02-23 LAB — EXPECTORATED SPUTUM ASSESSMENT W GRAM STAIN, RFLX TO RESP C

## 2011-02-23 LAB — ABO/RH: ABO/RH(D): O POS

## 2011-02-23 LAB — VANCOMYCIN, TROUGH: Vancomycin Tr: 20.9 — ABNORMAL HIGH

## 2011-02-23 LAB — CULTURE, RESPIRATORY W GRAM STAIN

## 2011-02-23 LAB — MAGNESIUM: Magnesium: 2.3

## 2011-02-23 LAB — URINE CULTURE: Special Requests: NEGATIVE

## 2011-02-23 LAB — CULTURE, ROUTINE-ABSCESS

## 2011-02-23 LAB — GIARDIA/CRYPTOSPORIDIUM SCREEN(EIA)

## 2011-02-23 LAB — OCCULT BLOOD X 1 CARD TO LAB, STOOL
Fecal Occult Bld: POSITIVE
Fecal Occult Bld: POSITIVE
Fecal Occult Bld: POSITIVE

## 2011-02-23 LAB — PROTIME-INR
INR: 1.1
Prothrombin Time: 14.1

## 2011-02-23 LAB — B-NATRIURETIC PEPTIDE (CONVERTED LAB)
Pro B Natriuretic peptide (BNP): 475 — ABNORMAL HIGH
Pro B Natriuretic peptide (BNP): 503 — ABNORMAL HIGH

## 2011-02-23 LAB — URINE MICROSCOPIC-ADD ON

## 2011-02-23 LAB — LACTIC ACID, PLASMA: Lactic Acid, Venous: 1

## 2011-02-23 LAB — HEMOGLOBIN A1C: Hgb A1c MFr Bld: 6.4 — ABNORMAL HIGH

## 2011-02-23 LAB — VANCOMYCIN, RANDOM: Vancomycin Rm: 16.5

## 2011-02-24 LAB — COMPREHENSIVE METABOLIC PANEL
ALT: 56 — ABNORMAL HIGH
AST: 42 — ABNORMAL HIGH
Albumin: 2.1 — ABNORMAL LOW
CO2: 16 — ABNORMAL LOW
Chloride: 104
Creatinine, Ser: 3.58 — ABNORMAL HIGH
GFR calc Af Amer: 15 — ABNORMAL LOW
GFR calc non Af Amer: 13 — ABNORMAL LOW
Potassium: 5.6 — ABNORMAL HIGH
Sodium: 133 — ABNORMAL LOW
Total Bilirubin: 1.8 — ABNORMAL HIGH

## 2011-02-24 LAB — POCT CARDIAC MARKERS
CKMB, poc: 4.2
Myoglobin, poc: 443
Myoglobin, poc: >500
Operator id: 146091
Operator id: 151321

## 2011-02-24 LAB — PROTIME-INR
INR: 1.1
Prothrombin Time: 14.1

## 2011-02-24 LAB — URINALYSIS, ROUTINE W REFLEX MICROSCOPIC
Bilirubin Urine: NEGATIVE
Glucose, UA: NEGATIVE
Ketones, ur: NEGATIVE
Protein, ur: 100 — AB
pH: 5

## 2011-02-24 LAB — CULTURE, BLOOD (ROUTINE X 2)

## 2011-02-24 LAB — DIFFERENTIAL
Basophils Relative: 0
Eosinophils Absolute: 0
Eosinophils Relative: 0
Monocytes Absolute: 1.1 — ABNORMAL HIGH
Neutro Abs: 20.9 — ABNORMAL HIGH
Neutrophils Relative %: 92 — ABNORMAL HIGH

## 2011-02-24 LAB — CBC
MCV: 77.2 — ABNORMAL LOW
Platelets: 356
RBC: 3.49 — ABNORMAL LOW
WBC: 22.7 — ABNORMAL HIGH

## 2011-02-24 LAB — URINE MICROSCOPIC-ADD ON

## 2011-02-25 LAB — DIFFERENTIAL
Basophils Absolute: 0
Basophils Absolute: 0
Basophils Absolute: 0
Basophils Absolute: 0
Basophils Absolute: 0
Basophils Absolute: 0
Basophils Absolute: 0
Basophils Relative: 0
Basophils Relative: 0
Basophils Relative: 0
Basophils Relative: 0
Basophils Relative: 0
Basophils Relative: 0
Basophils Relative: 0
Eosinophils Absolute: 0
Eosinophils Absolute: 0
Eosinophils Absolute: 0
Eosinophils Absolute: 0
Eosinophils Absolute: 0
Eosinophils Absolute: 0
Eosinophils Absolute: 0
Eosinophils Relative: 0
Eosinophils Relative: 0
Eosinophils Relative: 0
Eosinophils Relative: 0
Eosinophils Relative: 0
Eosinophils Relative: 0
Eosinophils Relative: 0
Lymphocytes Relative: 4 — ABNORMAL LOW
Lymphocytes Relative: 5 — ABNORMAL LOW
Lymphocytes Relative: 5 — ABNORMAL LOW
Lymphocytes Relative: 5 — ABNORMAL LOW
Lymphocytes Relative: 5 — ABNORMAL LOW
Lymphocytes Relative: 5 — ABNORMAL LOW
Lymphocytes Relative: 6 — ABNORMAL LOW
Lymphocytes Relative: 9 — ABNORMAL LOW
Lymphs Abs: 0.6 — ABNORMAL LOW
Lymphs Abs: 1
Lymphs Abs: 1.1
Lymphs Abs: 1.1
Lymphs Abs: 1.1
Lymphs Abs: 1.2
Lymphs Abs: 1.4
Lymphs Abs: 1.9
Monocytes Absolute: 0.3
Monocytes Absolute: 1.4 — ABNORMAL HIGH
Monocytes Absolute: 1.6 — ABNORMAL HIGH
Monocytes Absolute: 1.6 — ABNORMAL HIGH
Monocytes Absolute: 1.6 — ABNORMAL HIGH
Monocytes Absolute: 1.6 — ABNORMAL HIGH
Monocytes Absolute: 1.7 — ABNORMAL HIGH
Monocytes Relative: 2 — ABNORMAL LOW
Monocytes Relative: 6
Monocytes Relative: 7
Monocytes Relative: 7
Monocytes Relative: 7
Monocytes Relative: 8
Monocytes Relative: 8
Neutro Abs: 16.5 — ABNORMAL HIGH
Neutro Abs: 17.4 — ABNORMAL HIGH
Neutro Abs: 18.8 — ABNORMAL HIGH
Neutro Abs: 19.7 — ABNORMAL HIGH
Neutro Abs: 20 — ABNORMAL HIGH
Neutro Abs: 20.4 — ABNORMAL HIGH
Neutro Abs: 20.5 — ABNORMAL HIGH
Neutrophils Relative %: 87 — ABNORMAL HIGH
Neutrophils Relative %: 87 — ABNORMAL HIGH
Neutrophils Relative %: 87 — ABNORMAL HIGH
Neutrophils Relative %: 88 — ABNORMAL HIGH
Neutrophils Relative %: 88 — ABNORMAL HIGH
Neutrophils Relative %: 89 — ABNORMAL HIGH
Neutrophils Relative %: 89 — ABNORMAL HIGH
Neutrophils Relative %: 95 — ABNORMAL HIGH

## 2011-02-25 LAB — CBC
HCT: 27.8 — ABNORMAL LOW
HCT: 28.3 — ABNORMAL LOW
HCT: 28.4 — ABNORMAL LOW
HCT: 28.6 — ABNORMAL LOW
HCT: 28.9 — ABNORMAL LOW
HCT: 29 — ABNORMAL LOW
HCT: 29.2 — ABNORMAL LOW
HCT: 31 — ABNORMAL LOW
HCT: 31.4 — ABNORMAL LOW
HCT: 36.6
Hemoglobin: 11.4 — ABNORMAL LOW
Hemoglobin: 8.8 — ABNORMAL LOW
Hemoglobin: 9.1 — ABNORMAL LOW
Hemoglobin: 9.1 — ABNORMAL LOW
Hemoglobin: 9.1 — ABNORMAL LOW
Hemoglobin: 9.1 — ABNORMAL LOW
Hemoglobin: 9.2 — ABNORMAL LOW
Hemoglobin: 9.3 — ABNORMAL LOW
Hemoglobin: 9.7 — ABNORMAL LOW
Hemoglobin: 9.7 — ABNORMAL LOW
MCHC: 30.8
MCHC: 31
MCHC: 31.2
MCHC: 31.3
MCHC: 31.6
MCHC: 31.7
MCHC: 31.8
MCHC: 31.9
MCHC: 32.2
MCHC: 32.4
MCV: 75.5 — ABNORMAL LOW
MCV: 75.8 — ABNORMAL LOW
MCV: 76.1 — ABNORMAL LOW
MCV: 76.3 — ABNORMAL LOW
MCV: 76.4 — ABNORMAL LOW
MCV: 76.5 — ABNORMAL LOW
MCV: 76.8 — ABNORMAL LOW
MCV: 76.9 — ABNORMAL LOW
MCV: 77.3 — ABNORMAL LOW
Platelets: 619 — ABNORMAL HIGH
Platelets: 621 — ABNORMAL HIGH
Platelets: 651 — ABNORMAL HIGH
Platelets: 657 — ABNORMAL HIGH
Platelets: 673 — ABNORMAL HIGH
Platelets: 690 — ABNORMAL HIGH
Platelets: 691 — ABNORMAL HIGH
Platelets: 697 — ABNORMAL HIGH
Platelets: 701 — ABNORMAL HIGH
Platelets: 714 — ABNORMAL HIGH
RBC: 3.63 — ABNORMAL LOW
RBC: 3.71 — ABNORMAL LOW
RBC: 3.75 — ABNORMAL LOW
RBC: 3.78 — ABNORMAL LOW
RBC: 3.79 — ABNORMAL LOW
RBC: 3.79 — ABNORMAL LOW
RBC: 3.8 — ABNORMAL LOW
RBC: 4.05
RBC: 4.09
RBC: 4.72
RDW: 17.1 — ABNORMAL HIGH
RDW: 17.4 — ABNORMAL HIGH
RDW: 17.5 — ABNORMAL HIGH
RDW: 17.5 — ABNORMAL HIGH
RDW: 17.5 — ABNORMAL HIGH
RDW: 17.6 — ABNORMAL HIGH
RDW: 17.6 — ABNORMAL HIGH
RDW: 18.2 — ABNORMAL HIGH
RDW: 18.6 — ABNORMAL HIGH
WBC: 17.5 — ABNORMAL HIGH
WBC: 20 — ABNORMAL HIGH
WBC: 20.1 — ABNORMAL HIGH
WBC: 21.6 — ABNORMAL HIGH
WBC: 21.8 — ABNORMAL HIGH
WBC: 22.1 — ABNORMAL HIGH
WBC: 22.4 — ABNORMAL HIGH
WBC: 22.5 — ABNORMAL HIGH
WBC: 23.3 — ABNORMAL HIGH
WBC: 23.4 — ABNORMAL HIGH

## 2011-02-25 LAB — IRON AND TIBC
Iron: 115
Saturation Ratios: 48
TIBC: 242 — ABNORMAL LOW
TIBC: 247 — ABNORMAL LOW
UIBC: 127

## 2011-02-25 LAB — BASIC METABOLIC PANEL
BUN: 103 — ABNORMAL HIGH
BUN: 106 — ABNORMAL HIGH
BUN: 71 — ABNORMAL HIGH
BUN: 80 — ABNORMAL HIGH
CO2: 13 — ABNORMAL LOW
CO2: 14 — ABNORMAL LOW
CO2: 14 — ABNORMAL LOW
Calcium: 8.8
Calcium: 9.2
Calcium: 9.5
Chloride: 112
Chloride: 114 — ABNORMAL HIGH
Chloride: 117 — ABNORMAL HIGH
Creatinine, Ser: 3.3 — ABNORMAL HIGH
Creatinine, Ser: 3.4 — ABNORMAL HIGH
Creatinine, Ser: 4.12 — ABNORMAL HIGH
Creatinine, Ser: 4.22 — ABNORMAL HIGH
GFR calc Af Amer: 13 — ABNORMAL LOW
GFR calc Af Amer: 13 — ABNORMAL LOW
GFR calc Af Amer: 17 — ABNORMAL LOW
GFR calc non Af Amer: 10 — ABNORMAL LOW
GFR calc non Af Amer: 11 — ABNORMAL LOW
GFR calc non Af Amer: 13 — ABNORMAL LOW
GFR calc non Af Amer: 14 — ABNORMAL LOW
Glucose, Bld: 110 — ABNORMAL HIGH
Glucose, Bld: 260 — ABNORMAL HIGH
Glucose, Bld: 45 — ABNORMAL LOW
Potassium: 4.2
Potassium: 4.9
Potassium: 5.1
Sodium: 135
Sodium: 140
Sodium: 142

## 2011-02-25 LAB — URINALYSIS, ROUTINE W REFLEX MICROSCOPIC
Bilirubin Urine: NEGATIVE
Glucose, UA: NEGATIVE
Ketones, ur: NEGATIVE
Nitrite: NEGATIVE
Protein, ur: NEGATIVE
Specific Gravity, Urine: 1.013
Urobilinogen, UA: 0.2
pH: 6

## 2011-02-25 LAB — CULTURE, BLOOD (ROUTINE X 2)

## 2011-02-25 LAB — GLUCOSE, RANDOM
Glucose, Bld: 363 — ABNORMAL HIGH
Glucose, Bld: 378 — ABNORMAL HIGH

## 2011-02-25 LAB — SEDIMENTATION RATE
Sed Rate: 108 — ABNORMAL HIGH
Sed Rate: 110 — ABNORMAL HIGH

## 2011-02-25 LAB — FOLATE: Folate: 4.6

## 2011-02-25 LAB — RETICULOCYTES
RBC.: 3.84 — ABNORMAL LOW
Retic Count, Absolute: 96
Retic Ct Pct: 2.5

## 2011-02-25 LAB — RENAL FUNCTION PANEL
Albumin: 2.9 — ABNORMAL LOW
Calcium: 9.5
Chloride: 109
Creatinine, Ser: 3.4 — ABNORMAL HIGH
GFR calc Af Amer: 16 — ABNORMAL LOW
GFR calc non Af Amer: 13 — ABNORMAL LOW

## 2011-02-25 LAB — ANA: Anti Nuclear Antibody(ANA): NEGATIVE

## 2011-02-25 LAB — URINE MICROSCOPIC-ADD ON

## 2011-02-25 LAB — COMPREHENSIVE METABOLIC PANEL
ALT: 27
AST: 17
Albumin: 2.2 — ABNORMAL LOW
Alkaline Phosphatase: 138 — ABNORMAL HIGH
BUN: 102 — ABNORMAL HIGH
CO2: 14 — ABNORMAL LOW
Calcium: 8.9
Chloride: 112
Creatinine, Ser: 3.76 — ABNORMAL HIGH
GFR calc Af Amer: 14 — ABNORMAL LOW
GFR calc non Af Amer: 12 — ABNORMAL LOW
Glucose, Bld: 226 — ABNORMAL HIGH
Potassium: 5.4 — ABNORMAL HIGH
Sodium: 133 — ABNORMAL LOW
Total Bilirubin: 0.2 — ABNORMAL LOW
Total Protein: 6.7

## 2011-02-25 LAB — BODY FLUID CULTURE: Culture: NO GROWTH

## 2011-02-25 LAB — PHOSPHORUS: Phosphorus: 4.5

## 2011-02-25 LAB — RHEUMATOID FACTOR: Rhuematoid fact SerPl-aCnc: <20

## 2011-02-25 LAB — VITAMIN B12: Vitamin B-12: 994 — ABNORMAL HIGH (ref 211–911)

## 2011-02-25 LAB — URIC ACID: Uric Acid, Serum: 10.7 — ABNORMAL HIGH

## 2011-02-25 LAB — FERRITIN
Ferritin: 1435 — ABNORMAL HIGH (ref 10–291)
Ferritin: 591 — ABNORMAL HIGH (ref 10–291)

## 2011-02-25 LAB — SODIUM, URINE, RANDOM: Sodium, Ur: 106

## 2011-02-25 LAB — MAGNESIUM: Magnesium: 2.4

## 2011-05-13 DIAGNOSIS — K250 Acute gastric ulcer with hemorrhage: Secondary | ICD-10-CM | POA: Diagnosis not present

## 2011-05-13 DIAGNOSIS — D631 Anemia in chronic kidney disease: Secondary | ICD-10-CM | POA: Diagnosis not present

## 2011-05-13 DIAGNOSIS — N186 End stage renal disease: Secondary | ICD-10-CM | POA: Diagnosis not present

## 2011-05-13 DIAGNOSIS — D509 Iron deficiency anemia, unspecified: Secondary | ICD-10-CM | POA: Diagnosis not present

## 2011-05-13 DIAGNOSIS — N2581 Secondary hyperparathyroidism of renal origin: Secondary | ICD-10-CM | POA: Diagnosis not present

## 2011-05-21 DIAGNOSIS — R197 Diarrhea, unspecified: Secondary | ICD-10-CM | POA: Diagnosis not present

## 2011-05-21 DIAGNOSIS — J329 Chronic sinusitis, unspecified: Secondary | ICD-10-CM | POA: Diagnosis not present

## 2011-06-03 DIAGNOSIS — N186 End stage renal disease: Secondary | ICD-10-CM | POA: Diagnosis not present

## 2011-06-03 DIAGNOSIS — T7589XA Other specified effects of external causes, initial encounter: Secondary | ICD-10-CM | POA: Diagnosis not present

## 2011-06-03 DIAGNOSIS — Z7721 Contact with and (suspected) exposure to potentially hazardous body fluids: Secondary | ICD-10-CM | POA: Diagnosis not present

## 2011-06-03 DIAGNOSIS — E1129 Type 2 diabetes mellitus with other diabetic kidney complication: Secondary | ICD-10-CM | POA: Diagnosis not present

## 2011-06-10 DIAGNOSIS — N186 End stage renal disease: Secondary | ICD-10-CM | POA: Diagnosis not present

## 2011-06-12 DIAGNOSIS — N186 End stage renal disease: Secondary | ICD-10-CM | POA: Diagnosis not present

## 2011-06-12 DIAGNOSIS — D631 Anemia in chronic kidney disease: Secondary | ICD-10-CM | POA: Diagnosis not present

## 2011-06-12 DIAGNOSIS — N2581 Secondary hyperparathyroidism of renal origin: Secondary | ICD-10-CM | POA: Diagnosis not present

## 2011-06-12 DIAGNOSIS — D509 Iron deficiency anemia, unspecified: Secondary | ICD-10-CM | POA: Diagnosis not present

## 2011-07-08 DIAGNOSIS — N186 End stage renal disease: Secondary | ICD-10-CM | POA: Diagnosis not present

## 2011-07-10 DIAGNOSIS — K250 Acute gastric ulcer with hemorrhage: Secondary | ICD-10-CM | POA: Diagnosis not present

## 2011-07-10 DIAGNOSIS — N2581 Secondary hyperparathyroidism of renal origin: Secondary | ICD-10-CM | POA: Diagnosis not present

## 2011-07-10 DIAGNOSIS — N186 End stage renal disease: Secondary | ICD-10-CM | POA: Diagnosis not present

## 2011-07-10 DIAGNOSIS — D509 Iron deficiency anemia, unspecified: Secondary | ICD-10-CM | POA: Diagnosis not present

## 2011-07-10 DIAGNOSIS — D631 Anemia in chronic kidney disease: Secondary | ICD-10-CM | POA: Diagnosis not present

## 2011-08-08 DIAGNOSIS — N186 End stage renal disease: Secondary | ICD-10-CM | POA: Diagnosis not present

## 2011-08-10 DIAGNOSIS — D509 Iron deficiency anemia, unspecified: Secondary | ICD-10-CM | POA: Diagnosis not present

## 2011-08-10 DIAGNOSIS — N186 End stage renal disease: Secondary | ICD-10-CM | POA: Diagnosis not present

## 2011-08-10 DIAGNOSIS — D631 Anemia in chronic kidney disease: Secondary | ICD-10-CM | POA: Diagnosis not present

## 2011-08-10 DIAGNOSIS — K250 Acute gastric ulcer with hemorrhage: Secondary | ICD-10-CM | POA: Diagnosis not present

## 2011-08-10 DIAGNOSIS — N2581 Secondary hyperparathyroidism of renal origin: Secondary | ICD-10-CM | POA: Diagnosis not present

## 2011-08-20 ENCOUNTER — Other Ambulatory Visit (HOSPITAL_COMMUNITY): Payer: Self-pay | Admitting: Nephrology

## 2011-08-20 DIAGNOSIS — N186 End stage renal disease: Secondary | ICD-10-CM

## 2011-08-23 ENCOUNTER — Other Ambulatory Visit (HOSPITAL_COMMUNITY): Payer: Self-pay | Admitting: Nephrology

## 2011-08-23 ENCOUNTER — Ambulatory Visit (HOSPITAL_COMMUNITY)
Admission: RE | Admit: 2011-08-23 | Discharge: 2011-08-23 | Disposition: A | Discharge status: Home / Self Care | Payer: Medicare Other | Source: Ambulatory Visit | Attending: Nephrology | Admitting: Nephrology

## 2011-08-23 DIAGNOSIS — N186 End stage renal disease: Secondary | ICD-10-CM

## 2011-08-23 DIAGNOSIS — T82898A Other specified complication of vascular prosthetic devices, implants and grafts, initial encounter: Secondary | ICD-10-CM | POA: Insufficient documentation

## 2011-08-23 DIAGNOSIS — Y849 Medical procedure, unspecified as the cause of abnormal reaction of the patient, or of later complication, without mention of misadventure at the time of the procedure: Secondary | ICD-10-CM | POA: Insufficient documentation

## 2011-08-23 MED ORDER — IOHEXOL 300 MG/ML  SOLN
100.0000 mL | Freq: Once | INTRAMUSCULAR | Status: AC | PRN
  Administered 2011-08-23: 55 mL via INTRAVENOUS

## 2011-08-23 NOTE — Procedures (Signed)
Technically successful fistulogram with angioplasty.  No immediate complications.   

## 2011-08-24 NOTE — Progress Notes (Signed)
Post procedure follow up call.  No answer, no available VM

## 2011-09-02 DIAGNOSIS — E1129 Type 2 diabetes mellitus with other diabetic kidney complication: Secondary | ICD-10-CM | POA: Diagnosis not present

## 2011-09-02 DIAGNOSIS — N186 End stage renal disease: Secondary | ICD-10-CM | POA: Diagnosis not present

## 2011-09-07 DIAGNOSIS — N186 End stage renal disease: Secondary | ICD-10-CM | POA: Diagnosis not present

## 2011-09-09 DIAGNOSIS — D509 Iron deficiency anemia, unspecified: Secondary | ICD-10-CM | POA: Diagnosis not present

## 2011-09-09 DIAGNOSIS — N186 End stage renal disease: Secondary | ICD-10-CM | POA: Diagnosis not present

## 2011-09-09 DIAGNOSIS — N2581 Secondary hyperparathyroidism of renal origin: Secondary | ICD-10-CM | POA: Diagnosis not present

## 2011-09-09 DIAGNOSIS — D631 Anemia in chronic kidney disease: Secondary | ICD-10-CM | POA: Diagnosis not present

## 2011-09-09 DIAGNOSIS — K250 Acute gastric ulcer with hemorrhage: Secondary | ICD-10-CM | POA: Diagnosis not present

## 2011-09-11 ENCOUNTER — Other Ambulatory Visit (HOSPITAL_COMMUNITY): Payer: Self-pay | Admitting: Nephrology

## 2011-09-11 ENCOUNTER — Ambulatory Visit (HOSPITAL_COMMUNITY)
Admission: RE | Admit: 2011-09-11 | Discharge: 2011-09-11 | Disposition: A | Discharge status: Home / Self Care | Payer: Medicare Other | Source: Ambulatory Visit | Attending: Nephrology | Admitting: Nephrology

## 2011-09-11 ENCOUNTER — Encounter (HOSPITAL_COMMUNITY): Payer: Self-pay

## 2011-09-11 DIAGNOSIS — E119 Type 2 diabetes mellitus without complications: Secondary | ICD-10-CM | POA: Insufficient documentation

## 2011-09-11 DIAGNOSIS — T82898A Other specified complication of vascular prosthetic devices, implants and grafts, initial encounter: Secondary | ICD-10-CM | POA: Diagnosis not present

## 2011-09-11 DIAGNOSIS — E669 Obesity, unspecified: Secondary | ICD-10-CM | POA: Diagnosis not present

## 2011-09-11 DIAGNOSIS — Y832 Surgical operation with anastomosis, bypass or graft as the cause of abnormal reaction of the patient, or of later complication, without mention of misadventure at the time of the procedure: Secondary | ICD-10-CM | POA: Insufficient documentation

## 2011-09-11 DIAGNOSIS — M1990 Unspecified osteoarthritis, unspecified site: Secondary | ICD-10-CM | POA: Diagnosis not present

## 2011-09-11 DIAGNOSIS — M109 Gout, unspecified: Secondary | ICD-10-CM | POA: Diagnosis not present

## 2011-09-11 DIAGNOSIS — D649 Anemia, unspecified: Secondary | ICD-10-CM | POA: Diagnosis not present

## 2011-09-11 DIAGNOSIS — F329 Major depressive disorder, single episode, unspecified: Secondary | ICD-10-CM | POA: Diagnosis not present

## 2011-09-11 DIAGNOSIS — F3289 Other specified depressive episodes: Secondary | ICD-10-CM | POA: Insufficient documentation

## 2011-09-11 DIAGNOSIS — N186 End stage renal disease: Secondary | ICD-10-CM | POA: Insufficient documentation

## 2011-09-11 LAB — GLUCOSE, CAPILLARY
Glucose-Capillary: 111 mg/dL — ABNORMAL HIGH (ref 70–99)
Glucose-Capillary: 160 mg/dL — ABNORMAL HIGH (ref 70–99)

## 2011-09-11 MED ORDER — HEPARIN SODIUM (PORCINE) 1000 UNIT/ML IJ SOLN
INTRAMUSCULAR | Status: AC
  Filled 2011-09-11: qty 1

## 2011-09-11 MED ORDER — FENTANYL CITRATE 0.05 MG/ML IJ SOLN
INTRAMUSCULAR | Status: DC | PRN
  Administered 2011-09-11: 50 ug via INTRAVENOUS

## 2011-09-11 MED ORDER — IOHEXOL 300 MG/ML  SOLN
100.0000 mL | Freq: Once | INTRAMUSCULAR | Status: AC | PRN
  Administered 2011-09-11: 50 mL via INTRAVENOUS

## 2011-09-11 MED ORDER — MIDAZOLAM HCL 2 MG/2ML IJ SOLN
INTRAMUSCULAR | Status: AC
  Filled 2011-09-11: qty 4

## 2011-09-11 MED ORDER — FENTANYL CITRATE 0.05 MG/ML IJ SOLN
INTRAMUSCULAR | Status: AC
  Filled 2011-09-11: qty 4

## 2011-09-11 MED ORDER — ALTEPLASE 100 MG IV SOLR
2.0000 mg | Freq: Once | INTRAVENOUS | Status: AC
  Administered 2011-09-11: 2 mg
  Filled 2011-09-11: qty 2

## 2011-09-11 MED ORDER — MIDAZOLAM HCL 5 MG/5ML IJ SOLN
INTRAMUSCULAR | Status: DC | PRN
  Administered 2011-09-11: 0.5 mg via INTRAVENOUS
  Administered 2011-09-11: 1 mg via INTRAVENOUS

## 2011-09-11 NOTE — H&P (Signed)
[name] is an 78 y.o. female.   Chief Complaint: right thigh dialysis graft clotted Last used 5/2 Rt thigh angioplasty performed 4/12; good result Scheduled now for thrombolysis with possible angioplasty/stent placement. Possible dialysis catheter placement HPI: ESRD; DM  Past Medical History  Diagnosis Date  . Diabetes mellitus   . Anemia   . Hyperparathyroidism   . Depression   . Obesity   . Osteoarthritis   . MRSA (methicillin resistant staph aureus) culture positive   . Gout   . C. difficile diarrhea   . Chronic kidney disease     Past Surgical History  Procedure Date  . Breast biopsy     No family history on file. Social History:  does not have a smoking history on file. She does not have any smokeless tobacco history on file. Her alcohol and drug histories not on file.  Allergies:  Allergies  Allergen Reactions  . Codeine Hives  . Vancomycin      (Not in a hospital admission)  Results for orders placed during the hospital encounter of 09/11/11 (from the past 48 hour(s))  GLUCOSE, CAPILLARY     Status: Abnormal   Collection Time   09/11/11  9:41 AM      Component Value Range Comment   Glucose-Capillary 111 (*) 70 - 99 (mg/dL)    No results found.  Review of Systems  Constitutional: Negative for fever.  Gastrointestinal: Negative for nausea and vomiting.  Neurological: Negative for headaches.    Blood pressure 181/85, pulse 100, temperature 97.7 F (36.5 C), temperature source Oral, resp. rate 14, SpO2 100.00%. Physical Exam  Constitutional: She is oriented to person, place, and time. She appears well-developed and well-nourished.  Cardiovascular: Normal rate, regular rhythm and normal heart sounds.   No murmur heard. Respiratory: Effort normal and breath sounds normal. She has no wheezes.  GI: Soft. Bowel sounds are normal. There is no tenderness.  Musculoskeletal: Normal range of motion.       Right thigh graft: no pulse; no thrill    Neurological: She is alert and oriented to person, place, and time.  Skin: Skin is warm and dry.  Psychiatric: She has a normal mood and affect. Her behavior is normal. Judgment and thought content normal.     Assessment/Plan Right thigh graft clotted Scheduled now for thrombolysis and possible pta/stent placement. Possible dialysis catheter placement if needed. Pt aware of procedure benefits and risks and agreeable to proceed. Consent signed.  Nekhi Liwanag A 09/11/2011, 9:50 AM

## 2011-09-11 NOTE — Procedures (Signed)
Declot R thigh HD graft 7mm PTA venous anastamosis No technical complication. No blood loss. See complete dictation in [facility].

## 2011-09-11 NOTE — ED Notes (Addendum)
Pt regains attention within 30 secs pt able to verbalize needs

## 2011-09-11 NOTE — ED Notes (Signed)
Pt paused and stared off into space

## 2011-09-11 NOTE — ED Notes (Signed)
3000 units heparin given iv per md order

## 2011-10-08 DIAGNOSIS — N186 End stage renal disease: Secondary | ICD-10-CM | POA: Diagnosis not present

## 2011-10-09 DIAGNOSIS — N186 End stage renal disease: Secondary | ICD-10-CM | POA: Diagnosis not present

## 2011-10-09 DIAGNOSIS — D509 Iron deficiency anemia, unspecified: Secondary | ICD-10-CM | POA: Diagnosis not present

## 2011-10-09 DIAGNOSIS — N2581 Secondary hyperparathyroidism of renal origin: Secondary | ICD-10-CM | POA: Diagnosis not present

## 2011-10-09 DIAGNOSIS — K250 Acute gastric ulcer with hemorrhage: Secondary | ICD-10-CM | POA: Diagnosis not present

## 2011-10-09 DIAGNOSIS — E213 Hyperparathyroidism, unspecified: Secondary | ICD-10-CM | POA: Diagnosis not present

## 2011-10-09 DIAGNOSIS — D631 Anemia in chronic kidney disease: Secondary | ICD-10-CM | POA: Diagnosis not present

## 2011-10-12 ENCOUNTER — Ambulatory Visit (HOSPITAL_COMMUNITY)
Admission: RE | Admit: 2011-10-12 | Discharge: 2011-10-12 | Disposition: A | Discharge status: Home / Self Care | Payer: Medicare Other | Source: Ambulatory Visit | Attending: Nephrology | Admitting: Nephrology

## 2011-10-12 ENCOUNTER — Other Ambulatory Visit (HOSPITAL_COMMUNITY): Payer: Self-pay | Admitting: Nephrology

## 2011-10-12 ENCOUNTER — Encounter (HOSPITAL_COMMUNITY): Payer: Self-pay

## 2011-10-12 DIAGNOSIS — N186 End stage renal disease: Secondary | ICD-10-CM

## 2011-10-12 DIAGNOSIS — E119 Type 2 diabetes mellitus without complications: Secondary | ICD-10-CM | POA: Insufficient documentation

## 2011-10-12 DIAGNOSIS — D649 Anemia, unspecified: Secondary | ICD-10-CM | POA: Insufficient documentation

## 2011-10-12 DIAGNOSIS — I871 Compression of vein: Secondary | ICD-10-CM | POA: Insufficient documentation

## 2011-10-12 DIAGNOSIS — E669 Obesity, unspecified: Secondary | ICD-10-CM | POA: Insufficient documentation

## 2011-10-12 DIAGNOSIS — T82898A Other specified complication of vascular prosthetic devices, implants and grafts, initial encounter: Secondary | ICD-10-CM | POA: Diagnosis not present

## 2011-10-12 DIAGNOSIS — Y832 Surgical operation with anastomosis, bypass or graft as the cause of abnormal reaction of the patient, or of later complication, without mention of misadventure at the time of the procedure: Secondary | ICD-10-CM | POA: Insufficient documentation

## 2011-10-12 DIAGNOSIS — F3289 Other specified depressive episodes: Secondary | ICD-10-CM | POA: Insufficient documentation

## 2011-10-12 DIAGNOSIS — F329 Major depressive disorder, single episode, unspecified: Secondary | ICD-10-CM | POA: Insufficient documentation

## 2011-10-12 LAB — POCT I-STAT 4, (NA,K, GLUC, HGB,HCT)
Glucose, Bld: 108 mg/dL — ABNORMAL HIGH (ref 70–99)
HCT: 41 % (ref 36.0–46.0)
Potassium: 4.2 mEq/L (ref 3.5–5.1)
Sodium: 139 mEq/L (ref 135–145)

## 2011-10-12 MED ORDER — IOHEXOL 300 MG/ML  SOLN
100.0000 mL | Freq: Once | INTRAMUSCULAR | Status: AC | PRN
  Administered 2011-10-12: 50 mL via INTRAVENOUS

## 2011-10-12 MED ORDER — ALTEPLASE 100 MG IV SOLR
INTRAVENOUS | Status: AC | PRN
  Administered 2011-10-12: 2 mg

## 2011-10-12 MED ORDER — MIDAZOLAM HCL 2 MG/2ML IJ SOLN
INTRAMUSCULAR | Status: AC
  Filled 2011-10-12: qty 4

## 2011-10-12 MED ORDER — HEPARIN SODIUM (PORCINE) 1000 UNIT/ML IJ SOLN
INTRAMUSCULAR | Status: AC
  Filled 2011-10-12: qty 1

## 2011-10-12 MED ORDER — ALTEPLASE 100 MG IV SOLR
2.0000 mg | Freq: Once | INTRAVENOUS | Status: DC
  Filled 2011-10-12: qty 2

## 2011-10-12 MED ORDER — FENTANYL CITRATE 0.05 MG/ML IJ SOLN
INTRAMUSCULAR | Status: AC
  Filled 2011-10-12: qty 4

## 2011-10-12 MED ORDER — FENTANYL CITRATE 0.05 MG/ML IJ SOLN
INTRAMUSCULAR | Status: AC | PRN
  Administered 2011-10-12: 25 ug via INTRAVENOUS

## 2011-10-12 MED ORDER — HEPARIN SODIUM (PORCINE) 1000 UNIT/ML IJ SOLN
INTRAMUSCULAR | Status: AC | PRN
  Administered 2011-10-12: 3000 [IU] via INTRAVENOUS

## 2011-10-12 MED ORDER — MIDAZOLAM HCL 5 MG/5ML IJ SOLN
INTRAMUSCULAR | Status: AC | PRN
  Administered 2011-10-12: 0.5 mg via INTRAVENOUS

## 2011-10-12 MED ORDER — ALTEPLASE 30 MG/30 ML FOR INTERV. RAD
30.0000 mg | INTRA_ARTERIAL | Status: DC
  Filled 2011-10-12: qty 30

## 2011-10-12 NOTE — Discharge Instructions (Signed)
Moderate Sedation, Adult Moderate sedation is given to help you relax or even sleep through a procedure. You may remain sleepy, be clumsy, or have poor balance for several hours following this procedure. Arrange for a responsible adult, family member, or friend to take you home. A responsible adult should stay with you for at least 24 hours or until the medicines have worn off.  Do not participate in any activities where you could become injured for the next 24 hours, or until you feel normal again. Do not:   Drive.   Swim.   Ride a bicycle.   Operate heavy machinery.   Cook.   Use power tools.   Climb ladders.   Work at heights.   Do not make important decisions or sign legal documents until you are improved.   Vomiting may occur if you eat too soon. When you can drink without vomiting, try water, juice, or soup. Try solid foods if you feel little or no nausea.   Only take over-the-counter or prescription medications for pain, discomfort, or fever as directed by your caregiver.If pain medications have been prescribed for you, ask your caregiver how soon it is safe to take them.   Make sure you and your family fully understands everything about the medication given to you. Make sure you understand what side effects may occur.   You should not drink alcohol, take sleeping pills, or medications that cause drowsiness for at least 24 hours.   If you smoke, do not smoke alone.   If you are feeling better, you may resume normal activities 24 hours after receiving sedation.   Keep all appointments as scheduled. Follow all instructions.   Ask questions if you do not understand.  SEEK MEDICAL CARE IF:   Your skin is pale or bluish in color.   You continue to feel sick to your stomach (nauseous) or throw up (vomit).   Your pain is getting worse and not helped by medication.   You have bleeding or swelling.   You are still sleepy or feeling clumsy after 24 hours.  SEEK IMMEDIATE  MEDICAL CARE IF:   You develop a rash.   You have difficulty breathing.   You develop any type of allergic problem.   You have a fever.  Document Released: 01/19/2001 Document Revised: 04/15/2011 Document Reviewed: 06/12/2007 [employer] Patient Information 2012 [employer]. 

## 2011-10-12 NOTE — ED Notes (Signed)
O2 2L/[place] started 

## 2011-10-12 NOTE — H&P (Signed)
[name] is an 78 y.o. female.   Chief Complaint: ESRD; clotted rt thigh graft Last declot 09/11/11; successful; amenable to further intervention Scheduled now for thrombolysis and possible angioplasty/stent placement. Possible dialysis catheter placement if needed HPI: clotted rt thigh graft; DM; gout  Past Medical History  Diagnosis Date  . Diabetes mellitus   . Anemia   . Hyperparathyroidism   . Depression   . Obesity   . Osteoarthritis   . MRSA (methicillin resistant staph aureus) culture positive   . Gout   . C. difficile diarrhea   . Chronic kidney disease     Past Surgical History  Procedure Date  . Breast biopsy     No family history on file. Social History:  does not have a smoking history on file. She does not have any smokeless tobacco history on file. Her alcohol and drug histories not on file.  Allergies:  Allergies  Allergen Reactions  . Codeine Hives  . Vancomycin      (Not in a hospital admission)  No results found for this or any previous visit (from the past 48 hour(s)). No results found.  Review of Systems  Constitutional: Negative for fever.  Respiratory: Negative for cough.   Cardiovascular: Negative for chest pain.  Gastrointestinal: Negative for nausea and vomiting.  Neurological: Negative for dizziness and headaches.    There were no vitals taken for this visit. Physical Exam  Constitutional: She is oriented to person, place, and time. She appears well-developed.  Cardiovascular: Normal rate, regular rhythm and normal heart sounds.   No murmur heard. Respiratory: Effort normal. She has no wheezes.  GI: Soft. Bowel sounds are normal. There is no tenderness.  Musculoskeletal: Normal range of motion.       Rt thigh graft: no thrill; no pulse  Neurological: She is alert and oriented to person, place, and time.  Skin: Skin is warm and dry.  Psychiatric: She has a normal mood and affect. Her behavior is normal. Judgment and thought  content normal.     Assessment/Plan Rt thigh graft clotted Scheduled now for declot and possible pta/stent. Possible dialysis catheter placement if needed. Last declot 09/11/11; successful; amenable to further intervention if needed. Pt aware of procedure benefits and risks and agreeable to proceed. Consent signed.  [name] A 10/12/2011, 11:37 AM

## 2011-10-12 NOTE — Procedures (Signed)
Declot R thigh

## 2011-10-25 DIAGNOSIS — M79609 Pain in unspecified limb: Secondary | ICD-10-CM | POA: Diagnosis not present

## 2011-10-25 DIAGNOSIS — M898X9 Other specified disorders of bone, unspecified site: Secondary | ICD-10-CM | POA: Diagnosis not present

## 2011-10-25 DIAGNOSIS — L97509 Non-pressure chronic ulcer of other part of unspecified foot with unspecified severity: Secondary | ICD-10-CM | POA: Diagnosis not present

## 2011-10-26 ENCOUNTER — Encounter: Payer: Self-pay | Admitting: Vascular Surgery

## 2011-10-27 ENCOUNTER — Ambulatory Visit: Payer: Medicare Other | Admitting: Vascular Surgery

## 2011-10-29 ENCOUNTER — Other Ambulatory Visit: Payer: Self-pay | Admitting: Cardiology

## 2011-10-29 DIAGNOSIS — I739 Peripheral vascular disease, unspecified: Secondary | ICD-10-CM

## 2011-11-01 ENCOUNTER — Ambulatory Visit (HOSPITAL_COMMUNITY)
Admission: RE | Admit: 2011-11-01 | Discharge: 2011-11-01 | Disposition: A | Discharge status: Home / Self Care | Payer: Medicare Other | Source: Ambulatory Visit | Attending: Internal Medicine | Admitting: Internal Medicine

## 2011-11-01 ENCOUNTER — Other Ambulatory Visit (HOSPITAL_COMMUNITY): Payer: Self-pay | Admitting: Internal Medicine

## 2011-11-01 ENCOUNTER — Encounter (HOSPITAL_COMMUNITY): Payer: Self-pay

## 2011-11-01 DIAGNOSIS — N186 End stage renal disease: Secondary | ICD-10-CM | POA: Insufficient documentation

## 2011-11-01 DIAGNOSIS — E119 Type 2 diabetes mellitus without complications: Secondary | ICD-10-CM | POA: Insufficient documentation

## 2011-11-01 DIAGNOSIS — Z8614 Personal history of Methicillin resistant Staphylococcus aureus infection: Secondary | ICD-10-CM | POA: Diagnosis not present

## 2011-11-01 DIAGNOSIS — Y849 Medical procedure, unspecified as the cause of abnormal reaction of the patient, or of later complication, without mention of misadventure at the time of the procedure: Secondary | ICD-10-CM | POA: Insufficient documentation

## 2011-11-01 DIAGNOSIS — T82898A Other specified complication of vascular prosthetic devices, implants and grafts, initial encounter: Secondary | ICD-10-CM | POA: Diagnosis not present

## 2011-11-01 DIAGNOSIS — Z992 Dependence on renal dialysis: Secondary | ICD-10-CM | POA: Diagnosis not present

## 2011-11-01 DIAGNOSIS — E669 Obesity, unspecified: Secondary | ICD-10-CM | POA: Diagnosis not present

## 2011-11-01 LAB — POCT I-STAT 4, (NA,K, GLUC, HGB,HCT)
Hemoglobin: 12.2 g/dL (ref 12.0–15.0)
Potassium: 4.9 mEq/L (ref 3.5–5.1)

## 2011-11-01 MED ORDER — HEPARIN SODIUM (PORCINE) 1000 UNIT/ML IJ SOLN
INTRAMUSCULAR | Status: AC
  Administered 2011-11-01: 3000 [IU]
  Filled 2011-11-01: qty 1

## 2011-11-01 MED ORDER — ALTEPLASE 2 MG IJ SOLR
4.0000 mg | Freq: Once | INTRAMUSCULAR | Status: AC
  Administered 2011-11-01: 4 mg
  Filled 2011-11-01: qty 4

## 2011-11-01 MED ORDER — FENTANYL CITRATE 0.05 MG/ML IJ SOLN
INTRAMUSCULAR | Status: AC
  Filled 2011-11-01: qty 4

## 2011-11-01 MED ORDER — IOHEXOL 300 MG/ML  SOLN
100.0000 mL | Freq: Once | INTRAMUSCULAR | Status: AC | PRN
  Administered 2011-11-01: 50 mL via INTRAVENOUS

## 2011-11-01 MED ORDER — MIDAZOLAM HCL 2 MG/2ML IJ SOLN
INTRAMUSCULAR | Status: AC
  Filled 2011-11-01: qty 4

## 2011-11-01 NOTE — ED Notes (Addendum)
Pt has been without pain during procedure. No sedation meds given.  K level 4.9 called to Vilinda Blanks, NP

## 2011-11-01 NOTE — Procedures (Signed)
Technically successful declot of right thigh dialysis graft.  No immediate post procedural complications.  

## 2011-11-01 NOTE — H&P (Signed)
[name] is an 78 y.o. female.   Chief Complaint: ESRD; clotted rt thigh graft; last used 6/22 Last declot 10/12/11; 09/11/11; amenable to further intervention Scheduled now for thrombolysis and possible angioplasty/stent-- possible dialysis catheter placement if necessary HPI: ESRD; DM  Past Medical History  Diagnosis Date  . Diabetes mellitus   . Anemia   . Hyperparathyroidism   . Depression   . Obesity   . Osteoarthritis   . MRSA (methicillin resistant staph aureus) culture positive   . Gout   . C. difficile diarrhea   . Chronic kidney disease     Past Surgical History  Procedure Date  . Breast biopsy     No family history on file. Social History:  reports that she has never smoked. She has never used smokeless tobacco. She reports that she does not drink alcohol or use illicit drugs.  Allergies:  Allergies  Allergen Reactions  . Codeine Hives  . Vancomycin      (Not in a hospital admission)  No results found for this or any previous visit (from the past 48 hour(s)). No results found.  Review of Systems  Constitutional: Negative for fever.  Respiratory: Negative for shortness of breath.   Cardiovascular: Negative for chest pain.  Gastrointestinal: Negative for nausea and vomiting.  Neurological: Negative for headaches.    There were no vitals taken for this visit. Physical Exam  Constitutional: She is oriented to person, place, and time. She appears well-developed and well-nourished.  Cardiovascular: Normal rate, regular rhythm and normal heart sounds.   No murmur heard. Respiratory: Effort normal and breath sounds normal. She has no wheezes.  GI: Soft. Bowel sounds are normal. There is no tenderness.  Musculoskeletal: Normal range of motion.       Right leg graft no thrill; no pulses  Neurological: She is alert and oriented to person, place, and time.  Psychiatric: She has a normal mood and affect. Her behavior is normal. Judgment and thought content  normal.     Assessment/Plan Rt thigh dialysis graft clotted Scheduled now for thrombolysis and poss pta/stent. Possible dial catheter placement if needed. Pt aware of procedure benefits and risks and agreeable to proceed. Consent in chart  Gurshaan Matsuoka A 11/01/2011, 1:51 PM

## 2011-11-01 NOTE — ED Notes (Signed)
Discussed with MD prior Sedation problems with this patient. No sedation meds given. Pt has been with out pain.

## 2011-11-01 NOTE — ED Notes (Signed)
No sedation meds were given 

## 2011-11-03 ENCOUNTER — Ambulatory Visit: Payer: Medicare Other | Admitting: Vascular Surgery

## 2011-11-03 DIAGNOSIS — L97509 Non-pressure chronic ulcer of other part of unspecified foot with unspecified severity: Secondary | ICD-10-CM | POA: Diagnosis not present

## 2011-11-07 DIAGNOSIS — N186 End stage renal disease: Secondary | ICD-10-CM | POA: Diagnosis not present

## 2011-11-09 DIAGNOSIS — K250 Acute gastric ulcer with hemorrhage: Secondary | ICD-10-CM | POA: Diagnosis not present

## 2011-11-09 DIAGNOSIS — N039 Chronic nephritic syndrome with unspecified morphologic changes: Secondary | ICD-10-CM | POA: Diagnosis not present

## 2011-11-09 DIAGNOSIS — D509 Iron deficiency anemia, unspecified: Secondary | ICD-10-CM | POA: Diagnosis not present

## 2011-11-09 DIAGNOSIS — N186 End stage renal disease: Secondary | ICD-10-CM | POA: Diagnosis not present

## 2011-11-09 DIAGNOSIS — N2581 Secondary hyperparathyroidism of renal origin: Secondary | ICD-10-CM | POA: Diagnosis not present

## 2011-11-10 DIAGNOSIS — L97509 Non-pressure chronic ulcer of other part of unspecified foot with unspecified severity: Secondary | ICD-10-CM | POA: Diagnosis not present

## 2011-11-11 DIAGNOSIS — N2581 Secondary hyperparathyroidism of renal origin: Secondary | ICD-10-CM | POA: Diagnosis not present

## 2011-11-11 DIAGNOSIS — D631 Anemia in chronic kidney disease: Secondary | ICD-10-CM | POA: Diagnosis not present

## 2011-11-11 DIAGNOSIS — D509 Iron deficiency anemia, unspecified: Secondary | ICD-10-CM | POA: Diagnosis not present

## 2011-11-11 DIAGNOSIS — K250 Acute gastric ulcer with hemorrhage: Secondary | ICD-10-CM | POA: Diagnosis not present

## 2011-11-11 DIAGNOSIS — N186 End stage renal disease: Secondary | ICD-10-CM | POA: Diagnosis not present

## 2011-11-13 DIAGNOSIS — N186 End stage renal disease: Secondary | ICD-10-CM | POA: Diagnosis not present

## 2011-11-13 DIAGNOSIS — D509 Iron deficiency anemia, unspecified: Secondary | ICD-10-CM | POA: Diagnosis not present

## 2011-11-13 DIAGNOSIS — N2581 Secondary hyperparathyroidism of renal origin: Secondary | ICD-10-CM | POA: Diagnosis not present

## 2011-11-13 DIAGNOSIS — D631 Anemia in chronic kidney disease: Secondary | ICD-10-CM | POA: Diagnosis not present

## 2011-11-13 DIAGNOSIS — K250 Acute gastric ulcer with hemorrhage: Secondary | ICD-10-CM | POA: Diagnosis not present

## 2011-11-16 DIAGNOSIS — K250 Acute gastric ulcer with hemorrhage: Secondary | ICD-10-CM | POA: Diagnosis not present

## 2011-11-16 DIAGNOSIS — N2581 Secondary hyperparathyroidism of renal origin: Secondary | ICD-10-CM | POA: Diagnosis not present

## 2011-11-16 DIAGNOSIS — D631 Anemia in chronic kidney disease: Secondary | ICD-10-CM | POA: Diagnosis not present

## 2011-11-16 DIAGNOSIS — D509 Iron deficiency anemia, unspecified: Secondary | ICD-10-CM | POA: Diagnosis not present

## 2011-11-16 DIAGNOSIS — N186 End stage renal disease: Secondary | ICD-10-CM | POA: Diagnosis not present

## 2011-11-18 DIAGNOSIS — N186 End stage renal disease: Secondary | ICD-10-CM | POA: Diagnosis not present

## 2011-11-18 DIAGNOSIS — D509 Iron deficiency anemia, unspecified: Secondary | ICD-10-CM | POA: Diagnosis not present

## 2011-11-18 DIAGNOSIS — N2581 Secondary hyperparathyroidism of renal origin: Secondary | ICD-10-CM | POA: Diagnosis not present

## 2011-11-18 DIAGNOSIS — L97509 Non-pressure chronic ulcer of other part of unspecified foot with unspecified severity: Secondary | ICD-10-CM | POA: Diagnosis not present

## 2011-11-18 DIAGNOSIS — K250 Acute gastric ulcer with hemorrhage: Secondary | ICD-10-CM | POA: Diagnosis not present

## 2011-11-18 DIAGNOSIS — D631 Anemia in chronic kidney disease: Secondary | ICD-10-CM | POA: Diagnosis not present

## 2011-11-19 DIAGNOSIS — E1129 Type 2 diabetes mellitus with other diabetic kidney complication: Secondary | ICD-10-CM | POA: Diagnosis not present

## 2011-11-19 DIAGNOSIS — D509 Iron deficiency anemia, unspecified: Secondary | ICD-10-CM | POA: Diagnosis not present

## 2011-11-19 DIAGNOSIS — N185 Chronic kidney disease, stage 5: Secondary | ICD-10-CM | POA: Diagnosis not present

## 2011-11-19 DIAGNOSIS — E7800 Pure hypercholesterolemia, unspecified: Secondary | ICD-10-CM | POA: Diagnosis not present

## 2011-11-19 DIAGNOSIS — M949 Disorder of cartilage, unspecified: Secondary | ICD-10-CM | POA: Diagnosis not present

## 2011-11-19 DIAGNOSIS — N2581 Secondary hyperparathyroidism of renal origin: Secondary | ICD-10-CM | POA: Diagnosis not present

## 2011-11-19 DIAGNOSIS — M899 Disorder of bone, unspecified: Secondary | ICD-10-CM | POA: Diagnosis not present

## 2011-11-19 DIAGNOSIS — Z79899 Other long term (current) drug therapy: Secondary | ICD-10-CM | POA: Diagnosis not present

## 2011-11-19 DIAGNOSIS — Z0000 Encounter for general adult medical examination without abnormal findings: Secondary | ICD-10-CM | POA: Diagnosis not present

## 2011-11-20 DIAGNOSIS — N186 End stage renal disease: Secondary | ICD-10-CM | POA: Diagnosis not present

## 2011-11-20 DIAGNOSIS — D631 Anemia in chronic kidney disease: Secondary | ICD-10-CM | POA: Diagnosis not present

## 2011-11-20 DIAGNOSIS — D509 Iron deficiency anemia, unspecified: Secondary | ICD-10-CM | POA: Diagnosis not present

## 2011-11-20 DIAGNOSIS — N2581 Secondary hyperparathyroidism of renal origin: Secondary | ICD-10-CM | POA: Diagnosis not present

## 2011-11-20 DIAGNOSIS — K250 Acute gastric ulcer with hemorrhage: Secondary | ICD-10-CM | POA: Diagnosis not present

## 2011-11-23 ENCOUNTER — Other Ambulatory Visit: Payer: Self-pay | Admitting: Family Medicine

## 2011-11-23 ENCOUNTER — Other Ambulatory Visit: Payer: Self-pay | Admitting: *Deleted

## 2011-11-23 ENCOUNTER — Encounter (INDEPENDENT_AMBULATORY_CARE_PROVIDER_SITE_OTHER): Payer: Medicare Other

## 2011-11-23 DIAGNOSIS — I739 Peripheral vascular disease, unspecified: Secondary | ICD-10-CM | POA: Diagnosis not present

## 2011-11-23 DIAGNOSIS — L98499 Non-pressure chronic ulcer of skin of other sites with unspecified severity: Secondary | ICD-10-CM | POA: Diagnosis not present

## 2011-11-23 DIAGNOSIS — N2581 Secondary hyperparathyroidism of renal origin: Secondary | ICD-10-CM | POA: Diagnosis not present

## 2011-11-23 DIAGNOSIS — N186 End stage renal disease: Secondary | ICD-10-CM | POA: Diagnosis not present

## 2011-11-23 DIAGNOSIS — D509 Iron deficiency anemia, unspecified: Secondary | ICD-10-CM | POA: Diagnosis not present

## 2011-11-23 DIAGNOSIS — K250 Acute gastric ulcer with hemorrhage: Secondary | ICD-10-CM | POA: Diagnosis not present

## 2011-11-23 DIAGNOSIS — D631 Anemia in chronic kidney disease: Secondary | ICD-10-CM | POA: Diagnosis not present

## 2011-11-23 DIAGNOSIS — Z1231 Encounter for screening mammogram for malignant neoplasm of breast: Secondary | ICD-10-CM

## 2011-11-25 DIAGNOSIS — D509 Iron deficiency anemia, unspecified: Secondary | ICD-10-CM | POA: Diagnosis not present

## 2011-11-25 DIAGNOSIS — N186 End stage renal disease: Secondary | ICD-10-CM | POA: Diagnosis not present

## 2011-11-25 DIAGNOSIS — N039 Chronic nephritic syndrome with unspecified morphologic changes: Secondary | ICD-10-CM | POA: Diagnosis not present

## 2011-11-25 DIAGNOSIS — K250 Acute gastric ulcer with hemorrhage: Secondary | ICD-10-CM | POA: Diagnosis not present

## 2011-11-25 DIAGNOSIS — N2581 Secondary hyperparathyroidism of renal origin: Secondary | ICD-10-CM | POA: Diagnosis not present

## 2011-11-26 DIAGNOSIS — L97509 Non-pressure chronic ulcer of other part of unspecified foot with unspecified severity: Secondary | ICD-10-CM | POA: Diagnosis not present

## 2011-11-27 DIAGNOSIS — K250 Acute gastric ulcer with hemorrhage: Secondary | ICD-10-CM | POA: Diagnosis not present

## 2011-11-27 DIAGNOSIS — N2581 Secondary hyperparathyroidism of renal origin: Secondary | ICD-10-CM | POA: Diagnosis not present

## 2011-11-27 DIAGNOSIS — N186 End stage renal disease: Secondary | ICD-10-CM | POA: Diagnosis not present

## 2011-11-27 DIAGNOSIS — D509 Iron deficiency anemia, unspecified: Secondary | ICD-10-CM | POA: Diagnosis not present

## 2011-11-27 DIAGNOSIS — D631 Anemia in chronic kidney disease: Secondary | ICD-10-CM | POA: Diagnosis not present

## 2011-11-30 DIAGNOSIS — D509 Iron deficiency anemia, unspecified: Secondary | ICD-10-CM | POA: Diagnosis not present

## 2011-11-30 DIAGNOSIS — N2581 Secondary hyperparathyroidism of renal origin: Secondary | ICD-10-CM | POA: Diagnosis not present

## 2011-11-30 DIAGNOSIS — K250 Acute gastric ulcer with hemorrhage: Secondary | ICD-10-CM | POA: Diagnosis not present

## 2011-11-30 DIAGNOSIS — N039 Chronic nephritic syndrome with unspecified morphologic changes: Secondary | ICD-10-CM | POA: Diagnosis not present

## 2011-11-30 DIAGNOSIS — N186 End stage renal disease: Secondary | ICD-10-CM | POA: Diagnosis not present

## 2011-12-02 DIAGNOSIS — N186 End stage renal disease: Secondary | ICD-10-CM | POA: Diagnosis not present

## 2011-12-02 DIAGNOSIS — K250 Acute gastric ulcer with hemorrhage: Secondary | ICD-10-CM | POA: Diagnosis not present

## 2011-12-02 DIAGNOSIS — E1129 Type 2 diabetes mellitus with other diabetic kidney complication: Secondary | ICD-10-CM | POA: Diagnosis not present

## 2011-12-02 DIAGNOSIS — N2581 Secondary hyperparathyroidism of renal origin: Secondary | ICD-10-CM | POA: Diagnosis not present

## 2011-12-02 DIAGNOSIS — D509 Iron deficiency anemia, unspecified: Secondary | ICD-10-CM | POA: Diagnosis not present

## 2011-12-02 DIAGNOSIS — D631 Anemia in chronic kidney disease: Secondary | ICD-10-CM | POA: Diagnosis not present

## 2011-12-04 DIAGNOSIS — N2581 Secondary hyperparathyroidism of renal origin: Secondary | ICD-10-CM | POA: Diagnosis not present

## 2011-12-04 DIAGNOSIS — N186 End stage renal disease: Secondary | ICD-10-CM | POA: Diagnosis not present

## 2011-12-04 DIAGNOSIS — K250 Acute gastric ulcer with hemorrhage: Secondary | ICD-10-CM | POA: Diagnosis not present

## 2011-12-04 DIAGNOSIS — N039 Chronic nephritic syndrome with unspecified morphologic changes: Secondary | ICD-10-CM | POA: Diagnosis not present

## 2011-12-04 DIAGNOSIS — D509 Iron deficiency anemia, unspecified: Secondary | ICD-10-CM | POA: Diagnosis not present

## 2011-12-07 DIAGNOSIS — K250 Acute gastric ulcer with hemorrhage: Secondary | ICD-10-CM | POA: Diagnosis not present

## 2011-12-07 DIAGNOSIS — N186 End stage renal disease: Secondary | ICD-10-CM | POA: Diagnosis not present

## 2011-12-07 DIAGNOSIS — N039 Chronic nephritic syndrome with unspecified morphologic changes: Secondary | ICD-10-CM | POA: Diagnosis not present

## 2011-12-07 DIAGNOSIS — N2581 Secondary hyperparathyroidism of renal origin: Secondary | ICD-10-CM | POA: Diagnosis not present

## 2011-12-07 DIAGNOSIS — D509 Iron deficiency anemia, unspecified: Secondary | ICD-10-CM | POA: Diagnosis not present

## 2011-12-08 DIAGNOSIS — N186 End stage renal disease: Secondary | ICD-10-CM | POA: Diagnosis not present

## 2011-12-09 DIAGNOSIS — K250 Acute gastric ulcer with hemorrhage: Secondary | ICD-10-CM | POA: Diagnosis not present

## 2011-12-09 DIAGNOSIS — D509 Iron deficiency anemia, unspecified: Secondary | ICD-10-CM | POA: Diagnosis not present

## 2011-12-09 DIAGNOSIS — D631 Anemia in chronic kidney disease: Secondary | ICD-10-CM | POA: Diagnosis not present

## 2011-12-09 DIAGNOSIS — E213 Hyperparathyroidism, unspecified: Secondary | ICD-10-CM | POA: Diagnosis not present

## 2011-12-09 DIAGNOSIS — N186 End stage renal disease: Secondary | ICD-10-CM | POA: Diagnosis not present

## 2011-12-09 DIAGNOSIS — N2581 Secondary hyperparathyroidism of renal origin: Secondary | ICD-10-CM | POA: Diagnosis not present

## 2011-12-10 DIAGNOSIS — E1149 Type 2 diabetes mellitus with other diabetic neurological complication: Secondary | ICD-10-CM | POA: Diagnosis not present

## 2011-12-10 DIAGNOSIS — L97509 Non-pressure chronic ulcer of other part of unspecified foot with unspecified severity: Secondary | ICD-10-CM | POA: Diagnosis not present

## 2011-12-10 DIAGNOSIS — B351 Tinea unguium: Secondary | ICD-10-CM | POA: Diagnosis not present

## 2011-12-10 DIAGNOSIS — M79609 Pain in unspecified limb: Secondary | ICD-10-CM | POA: Diagnosis not present

## 2011-12-27 DIAGNOSIS — B351 Tinea unguium: Secondary | ICD-10-CM | POA: Diagnosis not present

## 2011-12-29 ENCOUNTER — Ambulatory Visit
Admission: RE | Admit: 2011-12-29 | Discharge: 2011-12-29 | Disposition: A | Discharge status: Home / Self Care | Payer: Medicare Other | Source: Ambulatory Visit | Attending: Family Medicine | Admitting: Family Medicine

## 2011-12-29 DIAGNOSIS — Z1231 Encounter for screening mammogram for malignant neoplasm of breast: Secondary | ICD-10-CM

## 2012-01-08 DIAGNOSIS — N186 End stage renal disease: Secondary | ICD-10-CM | POA: Diagnosis not present

## 2012-01-11 DIAGNOSIS — K250 Acute gastric ulcer with hemorrhage: Secondary | ICD-10-CM | POA: Diagnosis not present

## 2012-01-11 DIAGNOSIS — D631 Anemia in chronic kidney disease: Secondary | ICD-10-CM | POA: Diagnosis not present

## 2012-01-11 DIAGNOSIS — N186 End stage renal disease: Secondary | ICD-10-CM | POA: Diagnosis not present

## 2012-01-11 DIAGNOSIS — D509 Iron deficiency anemia, unspecified: Secondary | ICD-10-CM | POA: Diagnosis not present

## 2012-01-11 DIAGNOSIS — N2581 Secondary hyperparathyroidism of renal origin: Secondary | ICD-10-CM | POA: Diagnosis not present

## 2012-01-14 DIAGNOSIS — M2030 Hallux varus (acquired), unspecified foot: Secondary | ICD-10-CM | POA: Diagnosis not present

## 2012-01-14 DIAGNOSIS — E1149 Type 2 diabetes mellitus with other diabetic neurological complication: Secondary | ICD-10-CM | POA: Diagnosis not present

## 2012-01-14 DIAGNOSIS — L97509 Non-pressure chronic ulcer of other part of unspecified foot with unspecified severity: Secondary | ICD-10-CM | POA: Diagnosis not present

## 2012-02-07 DIAGNOSIS — N186 End stage renal disease: Secondary | ICD-10-CM | POA: Diagnosis not present

## 2012-02-08 DIAGNOSIS — D509 Iron deficiency anemia, unspecified: Secondary | ICD-10-CM | POA: Diagnosis not present

## 2012-02-08 DIAGNOSIS — N2581 Secondary hyperparathyroidism of renal origin: Secondary | ICD-10-CM | POA: Diagnosis not present

## 2012-02-08 DIAGNOSIS — D631 Anemia in chronic kidney disease: Secondary | ICD-10-CM | POA: Diagnosis not present

## 2012-02-08 DIAGNOSIS — N186 End stage renal disease: Secondary | ICD-10-CM | POA: Diagnosis not present

## 2012-02-08 DIAGNOSIS — K250 Acute gastric ulcer with hemorrhage: Secondary | ICD-10-CM | POA: Diagnosis not present

## 2012-02-09 ENCOUNTER — Other Ambulatory Visit (HOSPITAL_COMMUNITY): Payer: Self-pay | Admitting: Nephrology

## 2012-02-09 DIAGNOSIS — N186 End stage renal disease: Secondary | ICD-10-CM

## 2012-02-21 ENCOUNTER — Ambulatory Visit (HOSPITAL_COMMUNITY)
Admission: RE | Admit: 2012-02-21 | Discharge: 2012-02-21 | Disposition: A | Discharge status: Home / Self Care | Payer: Medicare Other | Source: Ambulatory Visit | Attending: Nephrology | Admitting: Nephrology

## 2012-02-21 DIAGNOSIS — N186 End stage renal disease: Secondary | ICD-10-CM

## 2012-02-21 DIAGNOSIS — T82898A Other specified complication of vascular prosthetic devices, implants and grafts, initial encounter: Secondary | ICD-10-CM | POA: Diagnosis not present

## 2012-02-21 DIAGNOSIS — Y849 Medical procedure, unspecified as the cause of abnormal reaction of the patient, or of later complication, without mention of misadventure at the time of the procedure: Secondary | ICD-10-CM | POA: Insufficient documentation

## 2012-02-21 MED ORDER — IOHEXOL 300 MG/ML  SOLN
100.0000 mL | Freq: Once | INTRAMUSCULAR | Status: AC | PRN
  Administered 2012-02-21: 40 mL via INTRAVENOUS

## 2012-02-23 ENCOUNTER — Telehealth (HOSPITAL_COMMUNITY): Payer: Self-pay | Admitting: *Deleted

## 2012-02-23 NOTE — Telephone Encounter (Signed)
Post procedure follow up call attempt.  Pt not available.  Person who answered call unwilling to take message for pt

## 2012-02-24 DIAGNOSIS — E1129 Type 2 diabetes mellitus with other diabetic kidney complication: Secondary | ICD-10-CM | POA: Diagnosis not present

## 2012-03-09 DIAGNOSIS — N186 End stage renal disease: Secondary | ICD-10-CM | POA: Diagnosis not present

## 2012-03-11 DIAGNOSIS — D631 Anemia in chronic kidney disease: Secondary | ICD-10-CM | POA: Diagnosis not present

## 2012-03-11 DIAGNOSIS — N2581 Secondary hyperparathyroidism of renal origin: Secondary | ICD-10-CM | POA: Diagnosis not present

## 2012-03-11 DIAGNOSIS — N186 End stage renal disease: Secondary | ICD-10-CM | POA: Diagnosis not present

## 2012-04-08 DIAGNOSIS — N186 End stage renal disease: Secondary | ICD-10-CM | POA: Diagnosis not present

## 2012-04-11 DIAGNOSIS — N186 End stage renal disease: Secondary | ICD-10-CM | POA: Diagnosis not present

## 2012-04-11 DIAGNOSIS — N2581 Secondary hyperparathyroidism of renal origin: Secondary | ICD-10-CM | POA: Diagnosis not present

## 2012-04-11 DIAGNOSIS — Z23 Encounter for immunization: Secondary | ICD-10-CM | POA: Diagnosis not present

## 2012-04-11 DIAGNOSIS — D631 Anemia in chronic kidney disease: Secondary | ICD-10-CM | POA: Diagnosis not present

## 2012-05-09 DIAGNOSIS — N186 End stage renal disease: Secondary | ICD-10-CM | POA: Diagnosis not present

## 2012-05-11 DIAGNOSIS — N186 End stage renal disease: Secondary | ICD-10-CM | POA: Diagnosis not present

## 2012-05-11 DIAGNOSIS — D631 Anemia in chronic kidney disease: Secondary | ICD-10-CM | POA: Diagnosis not present

## 2012-05-11 DIAGNOSIS — N2581 Secondary hyperparathyroidism of renal origin: Secondary | ICD-10-CM | POA: Diagnosis not present

## 2012-06-01 DIAGNOSIS — E1129 Type 2 diabetes mellitus with other diabetic kidney complication: Secondary | ICD-10-CM | POA: Diagnosis not present

## 2012-06-01 DIAGNOSIS — T7589XA Other specified effects of external causes, initial encounter: Secondary | ICD-10-CM | POA: Diagnosis not present

## 2012-06-09 DIAGNOSIS — N186 End stage renal disease: Secondary | ICD-10-CM | POA: Diagnosis not present

## 2012-06-10 DIAGNOSIS — N186 End stage renal disease: Secondary | ICD-10-CM | POA: Diagnosis not present

## 2012-06-10 DIAGNOSIS — D631 Anemia in chronic kidney disease: Secondary | ICD-10-CM | POA: Diagnosis not present

## 2012-06-10 DIAGNOSIS — N2581 Secondary hyperparathyroidism of renal origin: Secondary | ICD-10-CM | POA: Diagnosis not present

## 2012-07-03 DIAGNOSIS — M79609 Pain in unspecified limb: Secondary | ICD-10-CM | POA: Diagnosis not present

## 2012-07-08 DIAGNOSIS — N186 End stage renal disease: Secondary | ICD-10-CM | POA: Diagnosis not present

## 2012-07-08 DIAGNOSIS — D631 Anemia in chronic kidney disease: Secondary | ICD-10-CM | POA: Diagnosis not present

## 2012-07-08 DIAGNOSIS — N2581 Secondary hyperparathyroidism of renal origin: Secondary | ICD-10-CM | POA: Diagnosis not present

## 2012-08-07 DIAGNOSIS — N186 End stage renal disease: Secondary | ICD-10-CM | POA: Diagnosis not present

## 2012-08-07 DIAGNOSIS — E1149 Type 2 diabetes mellitus with other diabetic neurological complication: Secondary | ICD-10-CM | POA: Diagnosis not present

## 2012-08-07 DIAGNOSIS — Q828 Other specified congenital malformations of skin: Secondary | ICD-10-CM | POA: Diagnosis not present

## 2012-08-08 DIAGNOSIS — N2581 Secondary hyperparathyroidism of renal origin: Secondary | ICD-10-CM | POA: Diagnosis not present

## 2012-08-08 DIAGNOSIS — D631 Anemia in chronic kidney disease: Secondary | ICD-10-CM | POA: Diagnosis not present

## 2012-08-08 DIAGNOSIS — N186 End stage renal disease: Secondary | ICD-10-CM | POA: Diagnosis not present

## 2012-09-01 ENCOUNTER — Other Ambulatory Visit (HOSPITAL_COMMUNITY): Payer: Self-pay | Admitting: Nephrology

## 2012-09-01 DIAGNOSIS — N186 End stage renal disease: Secondary | ICD-10-CM

## 2012-09-02 DIAGNOSIS — E1129 Type 2 diabetes mellitus with other diabetic kidney complication: Secondary | ICD-10-CM | POA: Diagnosis not present

## 2012-09-06 ENCOUNTER — Other Ambulatory Visit (HOSPITAL_COMMUNITY): Payer: Self-pay | Admitting: Nephrology

## 2012-09-06 ENCOUNTER — Ambulatory Visit (HOSPITAL_COMMUNITY)
Admission: RE | Admit: 2012-09-06 | Discharge: 2012-09-06 | Disposition: A | Discharge status: Home / Self Care | Payer: Medicare Other | Source: Ambulatory Visit | Attending: Nephrology | Admitting: Nephrology

## 2012-09-06 DIAGNOSIS — E119 Type 2 diabetes mellitus without complications: Secondary | ICD-10-CM | POA: Insufficient documentation

## 2012-09-06 DIAGNOSIS — F329 Major depressive disorder, single episode, unspecified: Secondary | ICD-10-CM | POA: Diagnosis not present

## 2012-09-06 DIAGNOSIS — E213 Hyperparathyroidism, unspecified: Secondary | ICD-10-CM | POA: Diagnosis not present

## 2012-09-06 DIAGNOSIS — Z992 Dependence on renal dialysis: Secondary | ICD-10-CM | POA: Diagnosis not present

## 2012-09-06 DIAGNOSIS — F3289 Other specified depressive episodes: Secondary | ICD-10-CM | POA: Insufficient documentation

## 2012-09-06 DIAGNOSIS — D649 Anemia, unspecified: Secondary | ICD-10-CM | POA: Insufficient documentation

## 2012-09-06 DIAGNOSIS — N186 End stage renal disease: Secondary | ICD-10-CM | POA: Diagnosis not present

## 2012-09-06 DIAGNOSIS — M109 Gout, unspecified: Secondary | ICD-10-CM | POA: Insufficient documentation

## 2012-09-06 DIAGNOSIS — Z8614 Personal history of Methicillin resistant Staphylococcus aureus infection: Secondary | ICD-10-CM | POA: Insufficient documentation

## 2012-09-06 DIAGNOSIS — Z885 Allergy status to narcotic agent status: Secondary | ICD-10-CM | POA: Diagnosis not present

## 2012-09-06 DIAGNOSIS — T82898A Other specified complication of vascular prosthetic devices, implants and grafts, initial encounter: Secondary | ICD-10-CM | POA: Insufficient documentation

## 2012-09-06 DIAGNOSIS — Z881 Allergy status to other antibiotic agents status: Secondary | ICD-10-CM | POA: Diagnosis not present

## 2012-09-06 DIAGNOSIS — E669 Obesity, unspecified: Secondary | ICD-10-CM | POA: Insufficient documentation

## 2012-09-06 DIAGNOSIS — Y832 Surgical operation with anastomosis, bypass or graft as the cause of abnormal reaction of the patient, or of later complication, without mention of misadventure at the time of the procedure: Secondary | ICD-10-CM | POA: Insufficient documentation

## 2012-09-06 HISTORY — PX: SHUNTOGRAM: SHX6095

## 2012-09-06 MED ORDER — IOHEXOL 300 MG/ML  SOLN
100.0000 mL | Freq: Once | INTRAMUSCULAR | Status: AC | PRN
  Administered 2012-09-06: 65 mL via INTRAVENOUS

## 2012-09-06 NOTE — H&P (Signed)
HPI: [name] is an 79 y.o. female with ESRD.        She has developed poor access flows and prolonged bleeding from right thigh AV graft. She has had previous PTA in the past by [place] and is familiar with procedure. She is here today for this. PMHx and meds reivewed.   Past Medical History:  Past Medical History  Diagnosis Date  . Diabetes mellitus   . Anemia   . Hyperparathyroidism   . Depression   . Obesity   . Osteoarthritis   . MRSA (methicillin resistant staph aureus) culture positive   . Gout   . C. difficile diarrhea   . Chronic kidney disease     Past Surgical History:  Past Surgical History  Procedure Laterality Date  . Breast biopsy      Family History: No family history on file.  Social History:  reports that she has never smoked. She has never used smokeless tobacco. She reports that she does not drink alcohol or use illicit drugs.  Allergies:  Allergies  Allergen Reactions  . Codeine Hives  . Vancomycin     Medications: Med list reviewed.  Please HPI for pertinent positives, otherwise complete 10 system ROS negative.  Physical Exam: There were no vitals taken for this visit. There is no height or weight on file to calculate BMI.   General Appearance:  Alert, cooperative, no distress, appears stated age  Head:  Normocephalic, without obvious abnormality, atraumatic  ENT: Unremarkable  Neck: Supple, symmetrical, trachea midline,   Lungs:   Clear to auscultation bilaterally, no w/r/r, respirations unlabored without use of accessory muscles.  Heart:  Regular rate and rhythm, S1, S2 normal, no murmur, rub or gallop.  Extremities: Rt thigh AVG accessed for shuntogram. Nontender, leg/foot warm  Neurologic: Normal affect, no gross deficits.   No results found for this or any previous visit (from the past 48 hour(s)). No results found.  Assessment/Plan Stenosis of right LE thigh AVG in need of shuntogram, PTA Discussed procedure risks,  complications. Consent signed in chart  Brayton El PA-C 09/06/2012, 10:22 AM

## 2012-09-06 NOTE — H&P (Signed)
Agree with PA note.    Signed,  Merrick Maggio K. Davante Gerke, MD Vascular & Interventional Radiologist [facility]  

## 2012-09-06 NOTE — Procedures (Signed)
Interventional Radiology Procedure Note  Procedure:  1.) Diagnostic shuntogram reveals a focal high grade stenosis in the venous limb of the loop graft and a moderate stenosis of the venous anastomosis. 2.) Successful PTA of both stenoses to 7 mm Complications: None Recommendations: - Resume dialysis - Suture out in 48 hrs  Signed,  Sterling Big, MD Vascular & Interventional Radiologist [facility]

## 2012-09-07 DIAGNOSIS — D631 Anemia in chronic kidney disease: Secondary | ICD-10-CM | POA: Diagnosis not present

## 2012-09-07 DIAGNOSIS — N186 End stage renal disease: Secondary | ICD-10-CM | POA: Diagnosis not present

## 2012-09-07 DIAGNOSIS — N2581 Secondary hyperparathyroidism of renal origin: Secondary | ICD-10-CM | POA: Diagnosis not present

## 2012-09-13 ENCOUNTER — Other Ambulatory Visit (HOSPITAL_COMMUNITY): Payer: Self-pay | Admitting: Nephrology

## 2012-09-13 DIAGNOSIS — N186 End stage renal disease: Secondary | ICD-10-CM

## 2012-09-14 ENCOUNTER — Other Ambulatory Visit (HOSPITAL_COMMUNITY): Payer: Self-pay | Admitting: Nephrology

## 2012-09-14 ENCOUNTER — Ambulatory Visit (HOSPITAL_COMMUNITY)
Admission: RE | Admit: 2012-09-14 | Discharge: 2012-09-14 | Disposition: A | Discharge status: Home / Self Care | Payer: Medicare Other | Source: Ambulatory Visit | Attending: Nephrology | Admitting: Nephrology

## 2012-09-14 ENCOUNTER — Encounter (HOSPITAL_COMMUNITY): Payer: Self-pay

## 2012-09-14 DIAGNOSIS — E119 Type 2 diabetes mellitus without complications: Secondary | ICD-10-CM | POA: Insufficient documentation

## 2012-09-14 DIAGNOSIS — N186 End stage renal disease: Secondary | ICD-10-CM | POA: Insufficient documentation

## 2012-09-14 DIAGNOSIS — I82819 Embolism and thrombosis of superficial veins of unspecified lower extremities: Secondary | ICD-10-CM | POA: Insufficient documentation

## 2012-09-14 DIAGNOSIS — M1990 Unspecified osteoarthritis, unspecified site: Secondary | ICD-10-CM | POA: Insufficient documentation

## 2012-09-14 DIAGNOSIS — F329 Major depressive disorder, single episode, unspecified: Secondary | ICD-10-CM | POA: Insufficient documentation

## 2012-09-14 DIAGNOSIS — F3289 Other specified depressive episodes: Secondary | ICD-10-CM | POA: Diagnosis not present

## 2012-09-14 DIAGNOSIS — E213 Hyperparathyroidism, unspecified: Secondary | ICD-10-CM | POA: Diagnosis not present

## 2012-09-14 DIAGNOSIS — Z8614 Personal history of Methicillin resistant Staphylococcus aureus infection: Secondary | ICD-10-CM | POA: Insufficient documentation

## 2012-09-14 DIAGNOSIS — T82898A Other specified complication of vascular prosthetic devices, implants and grafts, initial encounter: Secondary | ICD-10-CM | POA: Insufficient documentation

## 2012-09-14 DIAGNOSIS — Z881 Allergy status to other antibiotic agents status: Secondary | ICD-10-CM | POA: Insufficient documentation

## 2012-09-14 DIAGNOSIS — D649 Anemia, unspecified: Secondary | ICD-10-CM | POA: Diagnosis not present

## 2012-09-14 DIAGNOSIS — E669 Obesity, unspecified: Secondary | ICD-10-CM | POA: Diagnosis not present

## 2012-09-14 DIAGNOSIS — M109 Gout, unspecified: Secondary | ICD-10-CM | POA: Diagnosis not present

## 2012-09-14 DIAGNOSIS — Z885 Allergy status to narcotic agent status: Secondary | ICD-10-CM | POA: Insufficient documentation

## 2012-09-14 DIAGNOSIS — Y832 Surgical operation with anastomosis, bypass or graft as the cause of abnormal reaction of the patient, or of later complication, without mention of misadventure at the time of the procedure: Secondary | ICD-10-CM | POA: Insufficient documentation

## 2012-09-14 HISTORY — PX: SP DECLOT AVGG: HXRAD28

## 2012-09-14 LAB — POTASSIUM: Potassium: 5.4 mEq/L — ABNORMAL HIGH (ref 3.5–5.1)

## 2012-09-14 MED ORDER — ALTEPLASE 100 MG IV SOLR
INTRAVENOUS | Status: AC | PRN
  Administered 2012-09-14 (×2): 1 mg

## 2012-09-14 MED ORDER — FENTANYL CITRATE 0.05 MG/ML IJ SOLN
INTRAMUSCULAR | Status: AC
  Filled 2012-09-14: qty 4

## 2012-09-14 MED ORDER — MIDAZOLAM HCL 2 MG/2ML IJ SOLN
INTRAMUSCULAR | Status: AC | PRN
  Administered 2012-09-14: 1 mg via INTRAVENOUS

## 2012-09-14 MED ORDER — IOHEXOL 300 MG/ML  SOLN
100.0000 mL | Freq: Once | INTRAMUSCULAR | Status: AC | PRN
  Administered 2012-09-14: 50 mL via INTRAVENOUS

## 2012-09-14 MED ORDER — SODIUM POLYSTYRENE SULFONATE 15 GM/60ML PO SUSP
60.0000 g | Freq: Once | ORAL | Status: DC
  Filled 2012-09-14: qty 240

## 2012-09-14 MED ORDER — ALTEPLASE 100 MG IV SOLR
2.0000 mg | Freq: Once | INTRAVENOUS | Status: DC
  Filled 2012-09-14: qty 2

## 2012-09-14 MED ORDER — HEPARIN SODIUM (PORCINE) 1000 UNIT/ML IJ SOLN
INTRAMUSCULAR | Status: AC
  Filled 2012-09-14: qty 1

## 2012-09-14 MED ORDER — SODIUM CHLORIDE 0.9 % IV SOLN
INTRAVENOUS | Status: AC | PRN
  Administered 2012-09-14: 20 mL via INTRAVENOUS

## 2012-09-14 MED ORDER — FENTANYL CITRATE 0.05 MG/ML IJ SOLN
INTRAMUSCULAR | Status: AC | PRN
  Administered 2012-09-14 (×2): 25 ug via INTRAVENOUS

## 2012-09-14 MED ORDER — HEPARIN SODIUM (PORCINE) 1000 UNIT/ML IJ SOLN
INTRAMUSCULAR | Status: AC | PRN
  Administered 2012-09-14: 3000 [IU] via INTRAVENOUS

## 2012-09-14 MED ORDER — MIDAZOLAM HCL 2 MG/2ML IJ SOLN
INTRAMUSCULAR | Status: AC
  Filled 2012-09-14: qty 4

## 2012-09-14 MED ORDER — ALTEPLASE 100 MG IV SOLR: 2.0000 mg | Freq: Once | INTRAVENOUS | Status: DC

## 2012-09-14 NOTE — ED Notes (Signed)
Potassium called to Bard Herbert, PA.  Kayexalate.

## 2012-09-14 NOTE — ED Notes (Signed)
O2 d/c'd 

## 2012-09-14 NOTE — ED Notes (Signed)
O2 2l/[place] started 

## 2012-09-14 NOTE — Procedures (Signed)
Procedure:  Right thigh dialysis graft declot and angioplasty Findings:  Right thigh graft opened and treated with 7 mm angioplasty.  Widely patent on completion.

## 2012-09-14 NOTE — H&P (Signed)
Agree 

## 2012-09-14 NOTE — H&P (Signed)
HPI: [name] is an 79 y.o. female with ESRD.       She had developed poor access flows and prolonged bleeding from right thigh AV graft and underwent PTA last week, 4/30. She is now found to have a clotted graft and sent to IR for declot. Last HD was 2 days ago, no issues. She has had this procedure before as well. PMHx and meds reviewed, no changes since we saw her last week.   Past Medical History:  Past Medical History  Diagnosis Date  . Diabetes mellitus   . Anemia   . Hyperparathyroidism   . Depression   . Obesity   . Osteoarthritis   . MRSA (methicillin resistant staph aureus) culture positive   . Gout   . C. difficile diarrhea   . Chronic kidney disease     Past Surgical History:  Past Surgical History  Procedure Laterality Date  . Breast biopsy      Family History: No family history on file.  Social History:  reports that she has never smoked. She has never used smokeless tobacco. She reports that she does not drink alcohol or use illicit drugs.  Allergies:  Allergies  Allergen Reactions  . Codeine Hives  . Vancomycin     Medications: Med list reviewed.  Please HPI for pertinent positives, otherwise complete 10 system ROS negative.  Physical Exam: There were no vitals taken for this visit. There is no height or weight on file to calculate BMI.   General Appearance:  Alert, cooperative, no distress, appears stated age  Head:  Normocephalic, without obvious abnormality, atraumatic  ENT: Unremarkable  Neck: Supple, symmetrical, trachea midline,   Lungs:   Clear to auscultation bilaterally, no w/r/r, respirations unlabored without use of accessory muscles.  Heart:  Regular rate and rhythm, S1, S2 normal, no murmur, rub or gallop.  Extremities: Rt thigh AVG without pulse, thrill, bruit  Neurologic: Normal affect, no gross deficits.    Assessment/Plan Thrombosis of right thigh AVGraft in need of thrmobolysis Discussed procedure risks,  complications. Consent signed in chart  Brayton El PA-C 09/14/2012, 10:44 AM

## 2012-09-24 ENCOUNTER — Other Ambulatory Visit (HOSPITAL_COMMUNITY): Payer: Self-pay | Admitting: Nephrology

## 2012-09-24 ENCOUNTER — Encounter (HOSPITAL_COMMUNITY): Payer: Self-pay

## 2012-09-24 ENCOUNTER — Ambulatory Visit (HOSPITAL_COMMUNITY)
Admission: RE | Admit: 2012-09-24 | Discharge: 2012-09-24 | Disposition: A | Discharge status: Home / Self Care | Payer: Medicare Other | Source: Ambulatory Visit | Attending: Nephrology | Admitting: Nephrology

## 2012-09-24 DIAGNOSIS — M109 Gout, unspecified: Secondary | ICD-10-CM | POA: Insufficient documentation

## 2012-09-24 DIAGNOSIS — D649 Anemia, unspecified: Secondary | ICD-10-CM | POA: Diagnosis not present

## 2012-09-24 DIAGNOSIS — N186 End stage renal disease: Secondary | ICD-10-CM

## 2012-09-24 DIAGNOSIS — E669 Obesity, unspecified: Secondary | ICD-10-CM | POA: Insufficient documentation

## 2012-09-24 DIAGNOSIS — I871 Compression of vein: Secondary | ICD-10-CM | POA: Insufficient documentation

## 2012-09-24 DIAGNOSIS — Z881 Allergy status to other antibiotic agents status: Secondary | ICD-10-CM | POA: Diagnosis not present

## 2012-09-24 DIAGNOSIS — E119 Type 2 diabetes mellitus without complications: Secondary | ICD-10-CM | POA: Insufficient documentation

## 2012-09-24 DIAGNOSIS — Z8614 Personal history of Methicillin resistant Staphylococcus aureus infection: Secondary | ICD-10-CM | POA: Diagnosis not present

## 2012-09-24 DIAGNOSIS — Z885 Allergy status to narcotic agent status: Secondary | ICD-10-CM | POA: Diagnosis not present

## 2012-09-24 DIAGNOSIS — F329 Major depressive disorder, single episode, unspecified: Secondary | ICD-10-CM | POA: Insufficient documentation

## 2012-09-24 DIAGNOSIS — I82609 Acute embolism and thrombosis of unspecified veins of unspecified upper extremity: Secondary | ICD-10-CM | POA: Diagnosis not present

## 2012-09-24 DIAGNOSIS — F3289 Other specified depressive episodes: Secondary | ICD-10-CM | POA: Diagnosis not present

## 2012-09-24 DIAGNOSIS — E213 Hyperparathyroidism, unspecified: Secondary | ICD-10-CM | POA: Diagnosis not present

## 2012-09-24 DIAGNOSIS — Y832 Surgical operation with anastomosis, bypass or graft as the cause of abnormal reaction of the patient, or of later complication, without mention of misadventure at the time of the procedure: Secondary | ICD-10-CM | POA: Insufficient documentation

## 2012-09-24 DIAGNOSIS — T82898A Other specified complication of vascular prosthetic devices, implants and grafts, initial encounter: Secondary | ICD-10-CM | POA: Insufficient documentation

## 2012-09-24 MED ORDER — MIDAZOLAM HCL 2 MG/2ML IJ SOLN
INTRAMUSCULAR | Status: AC
  Filled 2012-09-24: qty 2

## 2012-09-24 MED ORDER — MIDAZOLAM HCL 2 MG/2ML IJ SOLN
INTRAMUSCULAR | Status: DC | PRN
  Administered 2012-09-24 (×2): 1 mg via INTRAVENOUS

## 2012-09-24 MED ORDER — IOHEXOL 300 MG/ML  SOLN
100.0000 mL | Freq: Once | INTRAMUSCULAR | Status: AC | PRN
  Administered 2012-09-24: 40 mL via INTRAVENOUS

## 2012-09-24 MED ORDER — HEPARIN SODIUM (PORCINE) 1000 UNIT/ML IJ SOLN
INTRAMUSCULAR | Status: AC
  Administered 2012-09-24: 3000 [IU]
  Filled 2012-09-24: qty 1

## 2012-09-24 MED ORDER — FENTANYL CITRATE 0.05 MG/ML IJ SOLN
INTRAMUSCULAR | Status: AC
  Filled 2012-09-24: qty 2

## 2012-09-24 MED ORDER — FENTANYL CITRATE 0.05 MG/ML IJ SOLN
INTRAMUSCULAR | Status: DC | PRN
  Administered 2012-09-24 (×2): 50 ug via INTRAVENOUS

## 2012-09-24 MED ORDER — ALTEPLASE 100 MG IV SOLR
2.0000 mg | Freq: Once | INTRAVENOUS | Status: AC
  Administered 2012-09-24: 2 mg
  Filled 2012-09-24: qty 2

## 2012-09-24 NOTE — Procedures (Signed)
R thigh AVG declot PTA venous 8 mm. No comp

## 2012-09-24 NOTE — H&P (Signed)
HPI: [name] is an 79 y.o. female with ESRD.       She had developed poor access flows and prolonged bleeding from right thigh AV graft and underwent PTA last week, 5/8. She is now found to have a clotted graft and sent to IR for declot. Last HD was 4 days ago, no issues. She has had this procedure before as well. PMHx and meds reviewed, no changes since we saw her last week.   Past Medical History:  Past Medical History  Diagnosis Date  . Diabetes mellitus   . Anemia   . Hyperparathyroidism   . Depression   . Obesity   . Osteoarthritis   . MRSA (methicillin resistant staph aureus) culture positive   . Gout   . C. difficile diarrhea   . Chronic kidney disease     Past Surgical History:  Past Surgical History  Procedure Laterality Date  . Breast biopsy      Family History: No family history on file.  Social History:  reports that she has never smoked. She has never used smokeless tobacco. She reports that she does not drink alcohol or use illicit drugs.  Allergies:  Allergies  Allergen Reactions  . Codeine Hives  . Vancomycin     Medications: Med list reviewed.  Please HPI for pertinent positives, otherwise complete 10 system ROS negative.  Physical Exam: Pulse 81, resp. rate 14, SpO2 100.00%. There is no height or weight on file to calculate BMI.   General Appearance:  Alert, cooperative, no distress, appears stated age  Head:  Normocephalic, without obvious abnormality, atraumatic  ENT: Unremarkable  Neck: Supple, symmetrical, trachea midline,   Lungs:   Clear to auscultation bilaterally, no w/r/r, respirations unlabored without use of accessory muscles.  Heart:  Regular rate and rhythm, S1, S2 normal, no murmur, rub or gallop.  Extremities: Rt thigh AVG without pulse, thrill, bruit  Neurologic: Normal affect, no gross deficits.    Assessment/Plan Thrombosis of right thigh AVGraft in need of thrombolysis Discussed procedure risks,  complications. Consent signed in chart  Brayton El PA-C 09/24/2012, 8:13 AM

## 2012-09-25 ENCOUNTER — Ambulatory Visit (HOSPITAL_COMMUNITY): Payer: Medicare Other

## 2012-10-05 ENCOUNTER — Encounter: Payer: Self-pay | Admitting: Vascular Surgery

## 2012-10-06 ENCOUNTER — Ambulatory Visit (INDEPENDENT_AMBULATORY_CARE_PROVIDER_SITE_OTHER): Payer: Medicare Other | Admitting: Vascular Surgery

## 2012-10-06 ENCOUNTER — Encounter: Payer: Self-pay | Admitting: Vascular Surgery

## 2012-10-06 VITALS — BP 173/63 | HR 89 | Resp 16 | Ht 62.0 in | Wt 205.0 lb

## 2012-10-06 DIAGNOSIS — N186 End stage renal disease: Secondary | ICD-10-CM | POA: Insufficient documentation

## 2012-10-06 NOTE — Progress Notes (Signed)
[facility] [place]  Established Dialysis Access  History of Present Illness  [name] is a 79 y.o. (05-10-1933) female who presents for re-evaluation of R thigh AVG (03/07/09) placed by Dr. Myra Gianotti.  The pt has required percutaneous intervention on: 09/20/12 and 09/06/12 to get the AVG back open.  The patient notes no problems with HD at this point.  She thinks maybe she has decreased blood pressure on HD.  She denies any R foot steal sx.  Past Medical History  Diagnosis Date  . Diabetes mellitus   . Anemia   . Hyperparathyroidism   . Depression   . Obesity   . Osteoarthritis   . MRSA (methicillin resistant staph aureus) culture positive   . Gout   . C. difficile diarrhea   . Chronic kidney disease     Past Surgical History  Procedure Laterality Date  . Breast biopsy    . Shuntogram Right 09/06/12    Thigh graft  . Sp declot avgg Right Sep 14, 2012    Right thigh graft    History   Social History  . Marital Status: Divorced    Spouse Name: N/A    Number of Children: N/A  . Years of Education: N/A   Occupational History  . Not on file.   Social History Main Topics  . Smoking status: Never Smoker   . Smokeless tobacco: Never Used  . Alcohol Use: No  . Drug Use: No  . Sexually Active: Not Currently   Other Topics Concern  . Not on file   Social History Narrative  . No narrative on file    Family History  Problem Relation Age of Onset  . Cancer Father   . Diabetes Sister     Current Outpatient Prescriptions on File Prior to Visit  Medication Sig Dispense Refill  . sevelamer (RENVELA) 800 MG tablet Take 800 mg by mouth 3 (three) times daily with meals.        Marland Kitchen allopurinol (ZYLOPRIM) 100 MG tablet Take 100 mg by mouth daily.        Marland Kitchen amLODipine (NORVASC) 5 MG tablet Take 5 mg by mouth daily.        Marland Kitchen b complex-vitamin c-folic acid (NEPHRO-VITE) 0.8 MG TABS Take 0.8 mg by mouth at bedtime.        . triamcinolone (KENALOG) 0.1 %  cream Apply 1 application topically 2 (two) times daily.         No current facility-administered medications on file prior to visit.    Allergies  Allergen Reactions  . Codeine Hives  . Vancomycin     Review of Systems (Positive items checked otherwise negative)  General: [ ]  Weight loss, [ ]  Weight gain, [ ]   Loss of appetite, [ ]  Fever  Neurologic: [ ]  Dizziness, [ ]  Blackouts, [ ]  Headaches, [ ]  Seizure  Ear/Nose/Throat: [ ]  Change in eyesight, [ ]  Change in hearing, [ ]  Nose bleeds, [ ]  Sore throat  Vascular: [ ]  Pain in legs with walking, [ ]  Pain in feet while lying flat, [ ]  Non-healing ulcer, Stroke, [ ]  "Mini stroke", [ ]  Slurred speech, [ ]  Temporary blindness, [ ]  Blood clot in vein, [ ]  Phlebitis  Pulmonary: [ ]  Home oxygen, [ ]  Productive cough, [ ]  Bronchitis, [ ]  Coughing up blood, [ ]  Asthma, [ ]  Wheezing  Musculoskeletal: [ ]  Arthritis, [ ]  Joint pain, [ ]  Muscle pain  Cardiac: [ ]  Chest  pain, [ ]  Chest tightness/pressure, [ ]  Shortness of breath when lying flat, [ ]  Shortness of breath with exertion, [ ]  Palpitations, [ ]  Heart murmur, [ ]  Arrythmia,  [ ]  Atrial fibrillation  Hematologic: [ ]  Bleeding problems, [ ]  Clotting disorder, [ ]  Anemia  Psychiatric:  [ ]  Depression, [ ]  Anxiety, [ ]  Attention deficit disorder  Gastrointestinal:  [ ]  Black stool,[ ]   Blood in stool, [ ]  Peptic ulcer disease, [ ]  Reflux, [ ]  Hiatal hernia, [ ]  Trouble swallowing, [ ]  Diarrhea, [ ]  Constipation  Urinary:  [x]  Kidney disease, [ ]  Burning with urination, [ ]  Frequent urination, [ ]  Difficulty urinating  Skin: [ ]  Ulcers, [ ]  Rashes  Physical Examination  Filed Vitals:   10/06/12 1154  BP: 173/63  Pulse: 89  Resp: 16  Height: 5\' 2"  (1.575 m)  Weight: 205 lb (92.987 kg)  SpO2: 100%   Body mass index is 37.49 kg/(m^2).  General: A&O x 3, WD, obese  Pulmonary: Sym exp, good air movt, CTAB, no rales, rhonchi, & wheezing  Cardiac: RRR, Nl S1, S2, no Murmurs,  rubs or gallops  Gastrointestinal: soft, NTND, -G/R, - HSM, - masses, - CVAT B, large pannus extends to right groin  Musculoskeletal: M/S 5/5 throughout , Extremities without  ischemic changes , palpable thrill in access in R thigh, + bruit in access, small PSA in arterial arm, graft is palpable proximal to inguinal fold  Neurologic: Pain and light touch intact in extremities , Motor exam as listed above  IR Percutaneous thrombectomy (Date: 09/24/12)  Based on my review of the images, the venous anastomosis is 2-3 cm proximal to inguinal ligament.  The arterial anastomosis is attached to a heavily calcified segment of the artery  Medical Decision Making  ASRA GAMBREL is a 79 y.o. female who presents with ESRD requiring hemodialysis with likely venous , arterial , and intragraft stenoses   I suspect three reasons for the repeated thrombosis of the R thigh AVG: severe inflow disease (previous anastomosis was to the external iliac artery due to severity of calcific atherosclerosis, breakdown of the graft with time (approaching 4 years at this point), and venous stenosis.  The venous anastomosis is under the level of the pannus which make it easily 2-3 inches deep in the fat.  A re-exploration is at considerable risk for graft infection.  We discussed the risks of trying to revised the venous anastomosis.  Neither the patient nor myself is convinced this is to her advantage at this point.    If the arterial inflow is the culprit, there is no further way to jump the graft higher without going into the retroperitoneum.  At this point, the patient does NOT want to proceed with surgical revision.    I would could continue with percutaneous intervention at this point.    I am not convinced a revision can be easily completed, even if the patient is willing.  Leonides Sake, MD [facility] [place] Office: [phone] Pager: [phone]  10/06/2012, 4:58 PM

## 2012-10-07 DIAGNOSIS — N186 End stage renal disease: Secondary | ICD-10-CM | POA: Diagnosis not present

## 2012-10-09 ENCOUNTER — Other Ambulatory Visit: Payer: Self-pay | Admitting: *Deleted

## 2012-10-09 DIAGNOSIS — Z48812 Encounter for surgical aftercare following surgery on the circulatory system: Secondary | ICD-10-CM

## 2012-10-09 DIAGNOSIS — N186 End stage renal disease: Secondary | ICD-10-CM

## 2012-10-10 DIAGNOSIS — N039 Chronic nephritic syndrome with unspecified morphologic changes: Secondary | ICD-10-CM | POA: Diagnosis not present

## 2012-10-10 DIAGNOSIS — N2581 Secondary hyperparathyroidism of renal origin: Secondary | ICD-10-CM | POA: Diagnosis not present

## 2012-10-10 DIAGNOSIS — N186 End stage renal disease: Secondary | ICD-10-CM | POA: Diagnosis not present

## 2012-10-10 DIAGNOSIS — D631 Anemia in chronic kidney disease: Secondary | ICD-10-CM | POA: Diagnosis not present

## 2012-10-12 DIAGNOSIS — N186 End stage renal disease: Secondary | ICD-10-CM | POA: Diagnosis not present

## 2012-10-12 DIAGNOSIS — N039 Chronic nephritic syndrome with unspecified morphologic changes: Secondary | ICD-10-CM | POA: Diagnosis not present

## 2012-10-12 DIAGNOSIS — N2581 Secondary hyperparathyroidism of renal origin: Secondary | ICD-10-CM | POA: Diagnosis not present

## 2012-10-14 DIAGNOSIS — N2581 Secondary hyperparathyroidism of renal origin: Secondary | ICD-10-CM | POA: Diagnosis not present

## 2012-10-14 DIAGNOSIS — N186 End stage renal disease: Secondary | ICD-10-CM | POA: Diagnosis not present

## 2012-10-14 DIAGNOSIS — D631 Anemia in chronic kidney disease: Secondary | ICD-10-CM | POA: Diagnosis not present

## 2012-10-17 DIAGNOSIS — N186 End stage renal disease: Secondary | ICD-10-CM | POA: Diagnosis not present

## 2012-10-17 DIAGNOSIS — N039 Chronic nephritic syndrome with unspecified morphologic changes: Secondary | ICD-10-CM | POA: Diagnosis not present

## 2012-10-17 DIAGNOSIS — N2581 Secondary hyperparathyroidism of renal origin: Secondary | ICD-10-CM | POA: Diagnosis not present

## 2012-10-18 ENCOUNTER — Encounter: Payer: Self-pay | Admitting: Nephrology

## 2012-10-19 DIAGNOSIS — N186 End stage renal disease: Secondary | ICD-10-CM | POA: Diagnosis not present

## 2012-10-19 DIAGNOSIS — N039 Chronic nephritic syndrome with unspecified morphologic changes: Secondary | ICD-10-CM | POA: Diagnosis not present

## 2012-10-19 DIAGNOSIS — N2581 Secondary hyperparathyroidism of renal origin: Secondary | ICD-10-CM | POA: Diagnosis not present

## 2012-10-21 DIAGNOSIS — N2581 Secondary hyperparathyroidism of renal origin: Secondary | ICD-10-CM | POA: Diagnosis not present

## 2012-10-21 DIAGNOSIS — N039 Chronic nephritic syndrome with unspecified morphologic changes: Secondary | ICD-10-CM | POA: Diagnosis not present

## 2012-10-21 DIAGNOSIS — D631 Anemia in chronic kidney disease: Secondary | ICD-10-CM | POA: Diagnosis not present

## 2012-10-21 DIAGNOSIS — N186 End stage renal disease: Secondary | ICD-10-CM | POA: Diagnosis not present

## 2012-10-24 DIAGNOSIS — N2581 Secondary hyperparathyroidism of renal origin: Secondary | ICD-10-CM | POA: Diagnosis not present

## 2012-10-24 DIAGNOSIS — N039 Chronic nephritic syndrome with unspecified morphologic changes: Secondary | ICD-10-CM | POA: Diagnosis not present

## 2012-10-24 DIAGNOSIS — N186 End stage renal disease: Secondary | ICD-10-CM | POA: Diagnosis not present

## 2012-10-26 DIAGNOSIS — N186 End stage renal disease: Secondary | ICD-10-CM | POA: Diagnosis not present

## 2012-10-26 DIAGNOSIS — D631 Anemia in chronic kidney disease: Secondary | ICD-10-CM | POA: Diagnosis not present

## 2012-10-26 DIAGNOSIS — N2581 Secondary hyperparathyroidism of renal origin: Secondary | ICD-10-CM | POA: Diagnosis not present

## 2012-10-28 DIAGNOSIS — D631 Anemia in chronic kidney disease: Secondary | ICD-10-CM | POA: Diagnosis not present

## 2012-10-28 DIAGNOSIS — N2581 Secondary hyperparathyroidism of renal origin: Secondary | ICD-10-CM | POA: Diagnosis not present

## 2012-10-28 DIAGNOSIS — N186 End stage renal disease: Secondary | ICD-10-CM | POA: Diagnosis not present

## 2012-10-31 ENCOUNTER — Emergency Department (HOSPITAL_COMMUNITY): Payer: Medicare Other

## 2012-10-31 ENCOUNTER — Inpatient Hospital Stay (HOSPITAL_COMMUNITY)
Admission: EM | Admit: 2012-10-31 | Discharge: 2012-11-10 | DRG: 871 | Disposition: A | Discharge status: Skilled Nursing Facility | Payer: Medicare Other | Attending: Internal Medicine | Admitting: Internal Medicine

## 2012-10-31 ENCOUNTER — Encounter (HOSPITAL_COMMUNITY): Payer: Self-pay | Admitting: *Deleted

## 2012-10-31 DIAGNOSIS — J9600 Acute respiratory failure, unspecified whether with hypoxia or hypercapnia: Secondary | ICD-10-CM | POA: Diagnosis not present

## 2012-10-31 DIAGNOSIS — N2581 Secondary hyperparathyroidism of renal origin: Secondary | ICD-10-CM | POA: Diagnosis present

## 2012-10-31 DIAGNOSIS — B963 Hemophilus influenzae [H. influenzae] as the cause of diseases classified elsewhere: Secondary | ICD-10-CM | POA: Diagnosis not present

## 2012-10-31 DIAGNOSIS — R197 Diarrhea, unspecified: Secondary | ICD-10-CM

## 2012-10-31 DIAGNOSIS — R000 Tachycardia, unspecified: Secondary | ICD-10-CM | POA: Diagnosis not present

## 2012-10-31 DIAGNOSIS — E875 Hyperkalemia: Secondary | ICD-10-CM | POA: Diagnosis not present

## 2012-10-31 DIAGNOSIS — E119 Type 2 diabetes mellitus without complications: Secondary | ICD-10-CM

## 2012-10-31 DIAGNOSIS — I4891 Unspecified atrial fibrillation: Secondary | ICD-10-CM | POA: Diagnosis present

## 2012-10-31 DIAGNOSIS — D72823 Leukemoid reaction: Secondary | ICD-10-CM | POA: Diagnosis not present

## 2012-10-31 DIAGNOSIS — J90 Pleural effusion, not elsewhere classified: Secondary | ICD-10-CM | POA: Diagnosis not present

## 2012-10-31 DIAGNOSIS — F329 Major depressive disorder, single episode, unspecified: Secondary | ICD-10-CM | POA: Diagnosis present

## 2012-10-31 DIAGNOSIS — M899 Disorder of bone, unspecified: Secondary | ICD-10-CM | POA: Diagnosis present

## 2012-10-31 DIAGNOSIS — M949 Disorder of cartilage, unspecified: Secondary | ICD-10-CM | POA: Diagnosis present

## 2012-10-31 DIAGNOSIS — J984 Other disorders of lung: Secondary | ICD-10-CM

## 2012-10-31 DIAGNOSIS — E669 Obesity, unspecified: Secondary | ICD-10-CM | POA: Diagnosis present

## 2012-10-31 DIAGNOSIS — D649 Anemia, unspecified: Secondary | ICD-10-CM

## 2012-10-31 DIAGNOSIS — N039 Chronic nephritic syndrome with unspecified morphologic changes: Secondary | ICD-10-CM | POA: Diagnosis present

## 2012-10-31 DIAGNOSIS — A0472 Enterocolitis due to Clostridium difficile, not specified as recurrent: Secondary | ICD-10-CM

## 2012-10-31 DIAGNOSIS — J189 Pneumonia, unspecified organism: Secondary | ICD-10-CM | POA: Diagnosis not present

## 2012-10-31 DIAGNOSIS — Z8701 Personal history of pneumonia (recurrent): Secondary | ICD-10-CM | POA: Diagnosis not present

## 2012-10-31 DIAGNOSIS — Z992 Dependence on renal dialysis: Secondary | ICD-10-CM | POA: Diagnosis present

## 2012-10-31 DIAGNOSIS — F3289 Other specified depressive episodes: Secondary | ICD-10-CM | POA: Diagnosis present

## 2012-10-31 DIAGNOSIS — I959 Hypotension, unspecified: Secondary | ICD-10-CM | POA: Diagnosis not present

## 2012-10-31 DIAGNOSIS — Z6836 Body mass index (BMI) 36.0-36.9, adult: Secondary | ICD-10-CM

## 2012-10-31 DIAGNOSIS — I120 Hypertensive chronic kidney disease with stage 5 chronic kidney disease or end stage renal disease: Secondary | ICD-10-CM | POA: Diagnosis present

## 2012-10-31 DIAGNOSIS — Z8619 Personal history of other infectious and parasitic diseases: Secondary | ICD-10-CM | POA: Diagnosis present

## 2012-10-31 DIAGNOSIS — J168 Pneumonia due to other specified infectious organisms: Secondary | ICD-10-CM | POA: Diagnosis not present

## 2012-10-31 DIAGNOSIS — R42 Dizziness and giddiness: Secondary | ICD-10-CM | POA: Diagnosis not present

## 2012-10-31 DIAGNOSIS — D72829 Elevated white blood cell count, unspecified: Secondary | ICD-10-CM

## 2012-10-31 DIAGNOSIS — R0989 Other specified symptoms and signs involving the circulatory and respiratory systems: Secondary | ICD-10-CM | POA: Diagnosis not present

## 2012-10-31 DIAGNOSIS — Z418 Encounter for other procedures for purposes other than remedying health state: Secondary | ICD-10-CM | POA: Diagnosis not present

## 2012-10-31 DIAGNOSIS — J209 Acute bronchitis, unspecified: Secondary | ICD-10-CM | POA: Diagnosis not present

## 2012-10-31 DIAGNOSIS — A413 Sepsis due to Hemophilus influenzae: Secondary | ICD-10-CM

## 2012-10-31 DIAGNOSIS — K529 Noninfective gastroenteritis and colitis, unspecified: Secondary | ICD-10-CM

## 2012-10-31 DIAGNOSIS — A419 Sepsis, unspecified organism: Principal | ICD-10-CM

## 2012-10-31 DIAGNOSIS — A492 Hemophilus influenzae infection, unspecified site: Secondary | ICD-10-CM | POA: Diagnosis present

## 2012-10-31 DIAGNOSIS — M1990 Unspecified osteoarthritis, unspecified site: Secondary | ICD-10-CM | POA: Diagnosis present

## 2012-10-31 DIAGNOSIS — M6281 Muscle weakness (generalized): Secondary | ICD-10-CM | POA: Diagnosis not present

## 2012-10-31 DIAGNOSIS — J690 Pneumonitis due to inhalation of food and vomit: Secondary | ICD-10-CM | POA: Diagnosis present

## 2012-10-31 DIAGNOSIS — R0602 Shortness of breath: Secondary | ICD-10-CM | POA: Diagnosis not present

## 2012-10-31 DIAGNOSIS — I498 Other specified cardiac arrhythmias: Secondary | ICD-10-CM | POA: Diagnosis present

## 2012-10-31 DIAGNOSIS — M109 Gout, unspecified: Secondary | ICD-10-CM | POA: Diagnosis present

## 2012-10-31 DIAGNOSIS — J9819 Other pulmonary collapse: Secondary | ICD-10-CM | POA: Diagnosis present

## 2012-10-31 DIAGNOSIS — R0603 Acute respiratory distress: Secondary | ICD-10-CM

## 2012-10-31 DIAGNOSIS — Z5189 Encounter for other specified aftercare: Secondary | ICD-10-CM | POA: Diagnosis not present

## 2012-10-31 DIAGNOSIS — D631 Anemia in chronic kidney disease: Secondary | ICD-10-CM | POA: Diagnosis present

## 2012-10-31 DIAGNOSIS — E213 Hyperparathyroidism, unspecified: Secondary | ICD-10-CM | POA: Diagnosis not present

## 2012-10-31 DIAGNOSIS — R7881 Bacteremia: Secondary | ICD-10-CM | POA: Diagnosis not present

## 2012-10-31 DIAGNOSIS — E1151 Type 2 diabetes mellitus with diabetic peripheral angiopathy without gangrene: Secondary | ICD-10-CM | POA: Diagnosis present

## 2012-10-31 DIAGNOSIS — R0682 Tachypnea, not elsewhere classified: Secondary | ICD-10-CM | POA: Diagnosis present

## 2012-10-31 DIAGNOSIS — N186 End stage renal disease: Secondary | ICD-10-CM

## 2012-10-31 LAB — COMPREHENSIVE METABOLIC PANEL
ALT: 8 U/L (ref 0–35)
AST: 14 U/L (ref 0–37)
Albumin: 2.9 g/dL — ABNORMAL LOW (ref 3.5–5.2)
Alkaline Phosphatase: 91 U/L (ref 39–117)
CO2: 22 mEq/L (ref 19–32)
Chloride: 92 mEq/L — ABNORMAL LOW (ref 96–112)
Creatinine, Ser: 11.99 mg/dL — ABNORMAL HIGH (ref 0.50–1.10)
GFR calc non Af Amer: 3 mL/min — ABNORMAL LOW (ref >90)
Potassium: 5.8 mEq/L — ABNORMAL HIGH (ref 3.5–5.1)
Total Bilirubin: 0.9 mg/dL (ref 0.3–1.2)

## 2012-10-31 LAB — POCT I-STAT 3, ART BLOOD GAS (G3+)
O2 Saturation: 94 %
TCO2: 25 mmol/L (ref 0–100)
pCO2 arterial: 31.4 mmHg — ABNORMAL LOW (ref 35.0–45.0)
pH, Arterial: 7.493 — ABNORMAL HIGH (ref 7.350–7.450)
pO2, Arterial: 63 mmHg — ABNORMAL LOW (ref 80.0–100.0)

## 2012-10-31 LAB — CBC WITH DIFFERENTIAL/PLATELET
Basophils Absolute: 0 10*3/uL (ref 0.0–0.1)
Basophils Relative: 0 % (ref 0–1)
HCT: 35.6 % — ABNORMAL LOW (ref 36.0–46.0)
Hemoglobin: 11.3 g/dL — ABNORMAL LOW (ref 12.0–15.0)
Lymphocytes Relative: 4 % — ABNORMAL LOW (ref 12–46)
MCHC: 31.7 g/dL (ref 30.0–36.0)
Monocytes Absolute: 1.5 10*3/uL — ABNORMAL HIGH (ref 0.1–1.0)
Neutro Abs: 19.6 10*3/uL — ABNORMAL HIGH (ref 1.7–7.7)
Neutrophils Relative %: 89 % — ABNORMAL HIGH (ref 43–77)
RDW: 15.8 % — ABNORMAL HIGH (ref 11.5–15.5)
WBC: 21.9 10*3/uL — ABNORMAL HIGH (ref 4.0–10.5)

## 2012-10-31 LAB — HEMOGLOBIN A1C
Hgb A1c MFr Bld: 5.7 % — ABNORMAL HIGH (ref <5.7)
Mean Plasma Glucose: 117 mg/dL — ABNORMAL HIGH (ref <117)

## 2012-10-31 LAB — CBC
Hemoglobin: 10.5 g/dL — ABNORMAL LOW (ref 12.0–15.0)
RBC: 4.32 MIL/uL (ref 3.87–5.11)

## 2012-10-31 LAB — BASIC METABOLIC PANEL
CO2: 22 mEq/L (ref 19–32)
Calcium: 9 mg/dL (ref 8.4–10.5)
Creatinine, Ser: 13.09 mg/dL — ABNORMAL HIGH (ref 0.50–1.10)
GFR calc Af Amer: 3 mL/min — ABNORMAL LOW (ref >90)
GFR calc non Af Amer: 2 mL/min — ABNORMAL LOW (ref >90)

## 2012-10-31 LAB — CLOSTRIDIUM DIFFICILE BY PCR: Toxigenic C. Difficile by PCR: POSITIVE — AB

## 2012-10-31 LAB — HIV ANTIBODY (ROUTINE TESTING W REFLEX): HIV: NONREACTIVE

## 2012-10-31 LAB — MAGNESIUM: Magnesium: 2.2 mg/dL (ref 1.5–2.5)

## 2012-10-31 MED ORDER — DOCUSATE SODIUM 283 MG RE ENEM
1.0000 | ENEMA | RECTAL | Status: DC | PRN
  Filled 2012-10-31: qty 1

## 2012-10-31 MED ORDER — ALBUTEROL SULFATE (5 MG/ML) 0.5% IN NEBU
5.0000 mg | INHALATION_SOLUTION | Freq: Once | RESPIRATORY_TRACT | Status: AC
  Administered 2012-10-31: 5 mg via RESPIRATORY_TRACT

## 2012-10-31 MED ORDER — HYDROXYZINE HCL 25 MG PO TABS
25.0000 mg | ORAL_TABLET | Freq: Three times a day (TID) | ORAL | Status: DC | PRN
  Filled 2012-10-31: qty 1

## 2012-10-31 MED ORDER — SEVELAMER CARBONATE 800 MG PO TABS
800.0000 mg | ORAL_TABLET | Freq: Three times a day (TID) | ORAL | Status: DC
  Administered 2012-11-01 – 2012-11-10 (×23): 800 mg via ORAL
  Filled 2012-10-31 (×33): qty 1

## 2012-10-31 MED ORDER — HEPARIN SODIUM (PORCINE) 5000 UNIT/ML IJ SOLN
5000.0000 [IU] | Freq: Three times a day (TID) | INTRAMUSCULAR | Status: DC
  Administered 2012-11-01 – 2012-11-10 (×25): 5000 [IU] via SUBCUTANEOUS
  Filled 2012-10-31 (×33): qty 1

## 2012-10-31 MED ORDER — DEXTROSE 5 % IV SOLN
2.0000 g | Freq: Once | INTRAVENOUS | Status: DC
  Filled 2012-10-31: qty 2

## 2012-10-31 MED ORDER — LEVOFLOXACIN IN D5W 750 MG/150ML IV SOLN
750.0000 mg | Freq: Once | INTRAVENOUS | Status: DC
  Filled 2012-10-31: qty 150

## 2012-10-31 MED ORDER — LIDOCAINE HCL (PF) 1 % IJ SOLN: 5.0000 mL | INTRAMUSCULAR | Status: DC | PRN

## 2012-10-31 MED ORDER — LEVOFLOXACIN IN D5W 500 MG/100ML IV SOLN
500.0000 mg | INTRAVENOUS | Status: DC
  Administered 2012-11-02 – 2012-11-04 (×2): 500 mg via INTRAVENOUS
  Filled 2012-10-31 (×3): qty 100

## 2012-10-31 MED ORDER — PIPERACILLIN-TAZOBACTAM 3.375 G IVPB
3.3750 g | Freq: Once | INTRAVENOUS | Status: DC
  Filled 2012-10-31: qty 50

## 2012-10-31 MED ORDER — ALBUTEROL SULFATE (5 MG/ML) 0.5% IN NEBU
5.0000 mg | INHALATION_SOLUTION | Freq: Once | RESPIRATORY_TRACT | Status: AC
  Administered 2012-10-31: 5 mg via RESPIRATORY_TRACT
  Filled 2012-10-31: qty 1

## 2012-10-31 MED ORDER — ALLOPURINOL 100 MG PO TABS
100.0000 mg | ORAL_TABLET | Freq: Every day | ORAL | Status: DC
  Administered 2012-11-01 – 2012-11-10 (×10): 100 mg via ORAL
  Filled 2012-10-31 (×11): qty 1

## 2012-10-31 MED ORDER — NEPRO/CARBSTEADY PO LIQD
237.0000 mL | ORAL | Status: DC | PRN
  Filled 2012-10-31: qty 237

## 2012-10-31 MED ORDER — LIDOCAINE-PRILOCAINE 2.5-2.5 % EX CREA: 1.0000 "application " | TOPICAL_CREAM | CUTANEOUS | Status: DC | PRN

## 2012-10-31 MED ORDER — DEXTROSE 5 % IV SOLN: 500.0000 mg | INTRAVENOUS | Status: DC

## 2012-10-31 MED ORDER — ALTEPLASE 2 MG IJ SOLR
2.0000 mg | Freq: Once | INTRAMUSCULAR | Status: DC | PRN
  Filled 2012-10-31: qty 2

## 2012-10-31 MED ORDER — LINEZOLID 2 MG/ML IV SOLN
600.0000 mg | Freq: Two times a day (BID) | INTRAVENOUS | Status: DC
  Filled 2012-10-31 (×2): qty 300

## 2012-10-31 MED ORDER — RENA-VITE PO TABS
1.0000 | ORAL_TABLET | Freq: Every day | ORAL | Status: DC
  Administered 2012-11-01 – 2012-11-09 (×11): 1 via ORAL
  Filled 2012-10-31 (×12): qty 1

## 2012-10-31 MED ORDER — NEPRO/CARBSTEADY PO LIQD
237.0000 mL | Freq: Three times a day (TID) | ORAL | Status: DC | PRN
  Filled 2012-10-31: qty 237

## 2012-10-31 MED ORDER — ACETAMINOPHEN 650 MG RE SUPP: 650.0000 mg | Freq: Four times a day (QID) | RECTAL | Status: DC | PRN

## 2012-10-31 MED ORDER — AMLODIPINE BESYLATE 5 MG PO TABS
5.0000 mg | ORAL_TABLET | Freq: Every day | ORAL | Status: DC
  Filled 2012-10-31 (×2): qty 1

## 2012-10-31 MED ORDER — INSULIN ASPART 100 UNIT/ML ~~LOC~~ SOLN
0.0000 [IU] | Freq: Three times a day (TID) | SUBCUTANEOUS | Status: DC
  Administered 2012-11-01 (×2): 2 [IU] via SUBCUTANEOUS
  Administered 2012-11-02: 3 [IU] via SUBCUTANEOUS
  Administered 2012-11-03: 1 [IU] via SUBCUTANEOUS
  Administered 2012-11-03: 2 [IU] via SUBCUTANEOUS
  Administered 2012-11-03 – 2012-11-04 (×2): 1 [IU] via SUBCUTANEOUS
  Administered 2012-11-05 – 2012-11-06 (×2): 2 [IU] via SUBCUTANEOUS
  Administered 2012-11-06 (×2): 1 [IU] via SUBCUTANEOUS
  Administered 2012-11-08: 2 [IU] via SUBCUTANEOUS
  Administered 2012-11-09 – 2012-11-10 (×2): 1 [IU] via SUBCUTANEOUS

## 2012-10-31 MED ORDER — ONDANSETRON HCL 4 MG/2ML IJ SOLN: 4.0000 mg | Freq: Four times a day (QID) | INTRAMUSCULAR | Status: DC | PRN

## 2012-10-31 MED ORDER — LEVALBUTEROL HCL 0.63 MG/3ML IN NEBU
0.6300 mg | INHALATION_SOLUTION | RESPIRATORY_TRACT | Status: DC | PRN
  Filled 2012-10-31: qty 3

## 2012-10-31 MED ORDER — PIPERACILLIN-TAZOBACTAM IN DEX 2-0.25 GM/50ML IV SOLN
2.2500 g | Freq: Three times a day (TID) | INTRAVENOUS | Status: DC
  Filled 2012-10-31 (×2): qty 50

## 2012-10-31 MED ORDER — TRIAMCINOLONE ACETONIDE 0.1 % EX CREA
1.0000 "application " | TOPICAL_CREAM | Freq: Two times a day (BID) | CUTANEOUS | Status: DC
  Administered 2012-11-01 – 2012-11-05 (×8): 1 via TOPICAL
  Filled 2012-10-31: qty 15

## 2012-10-31 MED ORDER — ONDANSETRON HCL 4 MG PO TABS: 4.0000 mg | ORAL_TABLET | Freq: Four times a day (QID) | ORAL | Status: DC | PRN

## 2012-10-31 MED ORDER — RENA-VITE PO TABS
1.0000 | ORAL_TABLET | Freq: Every day | ORAL | Status: DC
  Filled 2012-10-31: qty 1

## 2012-10-31 MED ORDER — ACETAMINOPHEN 325 MG PO TABS: 650.0000 mg | ORAL_TABLET | Freq: Four times a day (QID) | ORAL | Status: DC | PRN

## 2012-10-31 MED ORDER — CALCIUM CARBONATE 1250 MG/5ML PO SUSP
500.0000 mg | Freq: Four times a day (QID) | ORAL | Status: DC | PRN
  Filled 2012-10-31: qty 5

## 2012-10-31 MED ORDER — SODIUM CHLORIDE 0.9 % IJ SOLN
3.0000 mL | Freq: Two times a day (BID) | INTRAMUSCULAR | Status: DC
  Administered 2012-11-01 – 2012-11-09 (×15): 3 mL via INTRAVENOUS

## 2012-10-31 MED ORDER — NEPRO/CARBSTEADY PO LIQD
237.0000 mL | Freq: Two times a day (BID) | ORAL | Status: DC
  Administered 2012-11-02 – 2012-11-09 (×10): 237 mL via ORAL
  Filled 2012-10-31 (×23): qty 237

## 2012-10-31 MED ORDER — SORBITOL 70 % SOLN
30.0000 mL | Status: DC | PRN
  Filled 2012-10-31: qty 30

## 2012-10-31 MED ORDER — GUAIFENESIN ER 600 MG PO TB12
1200.0000 mg | ORAL_TABLET | Freq: Two times a day (BID) | ORAL | Status: DC
  Administered 2012-11-01 – 2012-11-02 (×4): 1200 mg via ORAL
  Administered 2012-11-02: 600 mg via ORAL
  Administered 2012-11-03 – 2012-11-10 (×14): 1200 mg via ORAL
  Filled 2012-10-31 (×23): qty 2

## 2012-10-31 MED ORDER — DOXERCALCIFEROL 4 MCG/2ML IV SOLN
INTRAVENOUS | Status: AC
  Filled 2012-10-31: qty 2

## 2012-10-31 MED ORDER — LINEZOLID 2 MG/ML IV SOLN
600.0000 mg | Freq: Two times a day (BID) | INTRAVENOUS | Status: DC
  Administered 2012-11-01 – 2012-11-02 (×5): 600 mg via INTRAVENOUS
  Filled 2012-10-31 (×8): qty 300

## 2012-10-31 MED ORDER — SODIUM POLYSTYRENE SULFONATE 15 GM/60ML PO SUSP
45.0000 g | Freq: Once | ORAL | Status: AC
  Administered 2012-10-31: 45 g via ORAL
  Filled 2012-10-31: qty 180

## 2012-10-31 MED ORDER — ZOLPIDEM TARTRATE 5 MG PO TABS: 5.0000 mg | ORAL_TABLET | Freq: Every evening | ORAL | Status: DC | PRN

## 2012-10-31 MED ORDER — SODIUM CHLORIDE 0.9 % IV SOLN
500.0000 mg | Freq: Three times a day (TID) | INTRAVENOUS | Status: DC
  Filled 2012-10-31 (×2): qty 500

## 2012-10-31 MED ORDER — RACEPINEPHRINE HCL 2.25 % IN NEBU
0.5000 mL | INHALATION_SOLUTION | Freq: Once | RESPIRATORY_TRACT | Status: AC
  Administered 2012-10-31: 0.5 mL via RESPIRATORY_TRACT
  Filled 2012-10-31: qty 0.5

## 2012-10-31 MED ORDER — METRONIDAZOLE 500 MG PO TABS
500.0000 mg | ORAL_TABLET | Freq: Three times a day (TID) | ORAL | Status: DC
  Administered 2012-11-01 – 2012-11-10 (×28): 500 mg via ORAL
  Filled 2012-10-31 (×35): qty 1

## 2012-10-31 MED ORDER — HEPARIN SODIUM (PORCINE) 1000 UNIT/ML DIALYSIS
1000.0000 [IU] | INTRAMUSCULAR | Status: DC | PRN
  Filled 2012-10-31: qty 1

## 2012-10-31 MED ORDER — HEPARIN SODIUM (PORCINE) 1000 UNIT/ML DIALYSIS
20.0000 [IU]/kg | INTRAMUSCULAR | Status: DC | PRN
  Filled 2012-10-31: qty 2

## 2012-10-31 MED ORDER — ONDANSETRON HCL 4 MG/2ML IJ SOLN: 4.0000 mg | Freq: Three times a day (TID) | INTRAMUSCULAR | Status: DC | PRN

## 2012-10-31 MED ORDER — DOXERCALCIFEROL 4 MCG/2ML IV SOLN
3.0000 ug | INTRAVENOUS | Status: DC
  Administered 2012-10-31 – 2012-11-04 (×3): 3 ug via INTRAVENOUS
  Filled 2012-10-31 (×5): qty 2

## 2012-10-31 MED ORDER — SODIUM CHLORIDE 0.9 % IV SOLN: 250.0000 mL | INTRAVENOUS | Status: DC | PRN

## 2012-10-31 MED ORDER — PENTAFLUOROPROP-TETRAFLUOROETH EX AERO: 1.0000 "application " | INHALATION_SPRAY | CUTANEOUS | Status: DC | PRN

## 2012-10-31 MED ORDER — ACETAMINOPHEN 325 MG PO TABS
650.0000 mg | ORAL_TABLET | ORAL | Status: DC | PRN
  Administered 2012-10-31 – 2012-11-06 (×4): 650 mg via ORAL
  Filled 2012-10-31 (×3): qty 2

## 2012-10-31 MED ORDER — SODIUM CHLORIDE 0.9 % IJ SOLN: 3.0000 mL | INTRAMUSCULAR | Status: DC | PRN

## 2012-10-31 MED ORDER — LEVALBUTEROL HCL 0.63 MG/3ML IN NEBU
0.6300 mg | INHALATION_SOLUTION | Freq: Four times a day (QID) | RESPIRATORY_TRACT | Status: DC
  Administered 2012-10-31 – 2012-11-01 (×4): 0.63 mg via RESPIRATORY_TRACT
  Filled 2012-10-31 (×12): qty 3

## 2012-10-31 MED ORDER — PIPERACILLIN-TAZOBACTAM 3.375 G IVPB 30 MIN
3.3750 g | Freq: Once | INTRAVENOUS | Status: AC
  Administered 2012-10-31: 3.375 g via INTRAVENOUS

## 2012-10-31 MED ORDER — SEVELAMER CARBONATE 800 MG PO TABS: 800.0000 mg | ORAL_TABLET | Freq: Three times a day (TID) | ORAL | Status: DC

## 2012-10-31 MED ORDER — CEFTAZIDIME 2 G IJ SOLR
2.0000 g | Freq: Once | INTRAMUSCULAR | Status: DC
Start: 2012-10-31 — End: 2012-10-31
  Filled 2012-10-31: qty 2

## 2012-10-31 MED ORDER — SODIUM CHLORIDE 0.9 % IV SOLN: 100.0000 mL | INTRAVENOUS | Status: DC | PRN

## 2012-10-31 MED ORDER — CAMPHOR-MENTHOL 0.5-0.5 % EX LOTN
1.0000 "application " | TOPICAL_LOTION | Freq: Three times a day (TID) | CUTANEOUS | Status: DC | PRN
  Administered 2012-11-07: 1 via TOPICAL
  Filled 2012-10-31: qty 222

## 2012-10-31 MED ORDER — ACETAMINOPHEN 325 MG PO TABS
ORAL_TABLET | ORAL | Status: AC
  Filled 2012-10-31: qty 2

## 2012-10-31 MED ORDER — LORATADINE 10 MG PO TABS
10.0000 mg | ORAL_TABLET | Freq: Every day | ORAL | Status: DC
  Administered 2012-11-01 – 2012-11-10 (×10): 10 mg via ORAL
  Filled 2012-10-31 (×11): qty 1

## 2012-10-31 MED ORDER — DEXTROSE 5 % IV SOLN: 2.0000 g | INTRAVENOUS | Status: DC

## 2012-10-31 MED ORDER — ALBUTEROL SULFATE (5 MG/ML) 0.5% IN NEBU
INHALATION_SOLUTION | RESPIRATORY_TRACT | Status: AC
  Filled 2012-10-31: qty 1

## 2012-10-31 NOTE — Progress Notes (Signed)
Dr. Shana Chute called and informed of pt having a temp earlier and of her low BP and increased HR. States to give tylenol. Aware of blood cultures that were sent. Dr. Caryn Section called and informed of pt being tachy and of her BP being low. Unable to pull fluid during hemodialysis. No further changes at this time.

## 2012-10-31 NOTE — Consult Note (Signed)
[facility] Renal Consultation Note    Indication for Consultation:  Management of ESRD/hemodialysis; anemia, hypertension/volume and secondary hyperparathyroidism  HPI: [name] is a 79 y.o. female with ESRD secondary to DM, HTN, obesity on TTS at the [facility] since 2008  who presents with SOB, cough and weakness who was found to have a leukocytosis of 21.9K with 89% neutrophils and low grade temp. Initial CXR nonspecific.  She reports the onset of cough yesterday.  She has liquid stools "on and off" and has had to wear depends since 2008 when she had a MRSA infection. She denies overt fever, chills or pain. Up until now, appetite has been good, but didn't eat yesterday. Occasionally makes a small amount of urine. Denies problems with dialysis treatments. Had incontinent liquid stool with coughing (also had kayexalate in ED for ^ K of 5.8)  Pt admitted for further evaluation and treatment - she is due for dialysis today.  Past Medical History  Diagnosis Date  . Diabetes mellitus   . Anemia   . Hyperparathyroidism   . Depression   . Obesity   . Osteoarthritis   . MRSA (methicillin resistant staph aureus) culture positive   . Gout   . C. difficile diarrhea   . Chronic kidney disease   . Diabetes mellitus 10/31/2012   Past Surgical History  Procedure Laterality Date  . Breast biopsy    . Shuntogram Right 09/06/12    Thigh graft  . Sp declot avgg Right Sep 14, 2012    Right thigh graft   Family History  Problem Relation Age of Onset  . Cancer Father   . Diabetes Sister    Social History:  reports that she has never smoked. She has never used smokeless tobacco. She reports that she does not drink alcohol or use illicit drugs. Allergies  Allergen Reactions  . Codeine Hives  . Vancomycin    Prior to Admission medications   Medication Sig Start Date End Date Taking? Authorizing Provider  b complex-vitamin c-folic acid (NEPHRO-VITE) 0.8 MG TABS Take 0.8 mg by mouth  at bedtime.     Yes Historical Provider, MD  sevelamer (RENVELA) 800 MG tablet Take 800 mg by mouth 3 (three) times daily with meals.     Yes Historical Provider, MD  allopurinol (ZYLOPRIM) 100 MG tablet Take 100 mg by mouth daily.      Historical Provider, MD  amLODipine (NORVASC) 5 MG tablet Take 5 mg by mouth daily.      Historical Provider, MD  triamcinolone (KENALOG) 0.1 % cream Apply 1 application topically 2 (two) times daily.      Historical Provider, MD   Current Facility-Administered Medications  Medication Dose Route Frequency Provider Last Rate Last Dose  . azithromycin (ZITHROMAX) 500 mg in dextrose 5 % 250 mL IVPB  500 mg Intravenous Q24H Rodolph Bong, MD      . levalbuterol [facility]) nebulizer solution 0.63 mg  0.63 mg Nebulization Q6H Rodolph Bong, MD      . levalbuterol [facility]) nebulizer solution 0.63 mg  0.63 mg Nebulization Q3H PRN Rodolph Bong, MD      . linezolid (ZYVOX) IVPB 600 mg  600 mg Intravenous Q12H Glynn Octave, MD      . ondansetron [facility]) injection 4 mg  4 mg Intravenous Q8H PRN Glynn Octave, MD       Current Outpatient Prescriptions  Medication Sig Dispense Refill  . b complex-vitamin c-folic acid (NEPHRO-VITE) 0.8 MG TABS Take  0.8 mg by mouth at bedtime.        . sevelamer (RENVELA) 800 MG tablet Take 800 mg by mouth 3 (three) times daily with meals.        Marland Kitchen allopurinol (ZYLOPRIM) 100 MG tablet Take 100 mg by mouth daily.        Marland Kitchen amLODipine (NORVASC) 5 MG tablet Take 5 mg by mouth daily.        Marland Kitchen triamcinolone (KENALOG) 0.1 % cream Apply 1 application topically 2 (two) times daily.         Labs: Basic Metabolic Panel:  Recent Labs Lab 10/31/12 1220  NA 135  K 5.8*  CL 92*  CO2 22  GLUCOSE 166*  BUN 62*  CREATININE 11.99*  CALCIUM 9.1   Liver Function Tests:  Recent Labs Lab 10/31/12 1220  AST 14  ALT 8  ALKPHOS 91  BILITOT 0.9  PROT 7.5  ALBUMIN 2.9*  CBC:  Recent Labs Lab 10/31/12 1220  WBC 21.9*   NEUTROABS 19.6*  HGB 11.3*  HCT 35.6*  MCV 77.1*  PLT 225   Cardiac Enzymes:  Recent Labs Lab 10/31/12 1220  TROPONINI <0.30  Studies/Results: Dg Chest Port 1 View  (if Code Sepsis Called)  10/31/2012   *RADIOLOGY REPORT*  Clinical Data: Short of breath for 2 days.  Weakness.  Diabetic patient.  PORTABLE CHEST - 1 VIEW  Comparison: 11/08/2008.  Findings: Low volume chest.  Areas of subsegmental atelectasis are present superimposed on scarring.  Density superior to the right hemidiaphragm is favored to represent subsegmental atelectasis, seen obliquely.  The cardiopericardial silhouette is within normal limits allowing for low volumes.  No definite pleural effusion. No focal areas of consolidation.  IMPRESSION: Low volume chest.  Scattered areas of predominately linear density is favored to represent atelectasis based on lung volumes. Consider repeat PA and lateral chest with full inspiration when patient condition permits.   Original Report Authenticated By: Andreas Newport, M.D.   ROS: As per HPI otherwise negative  Physical Exam: Bed weight 95kg on 3312 Filed Vitals:   10/31/12 1032 10/31/12 1117 10/31/12 1400  BP: 133/55  146/57  Pulse: 115 113 107  Temp: 99.2 F (37.3 C)    TempSrc: Oral    Resp: 32 28 24  SpO2: 92% 96% 100%     General: Elderly  Ill appearing, with occ barking cough. Head: Normocephalic, atraumatic, sclera non-icteric, mucus membranes are dry - mouth breathing Neck: Supple. JVD not elevated. Lungs: appears to have some difficulty breathing when sleeping, but when she wakes up seems less so. Coarse BS.  Bronchial BS Lt base Heart: tachy regular Abdomen: Obese soft NT + BS M-S:  Diminished strength and tone  for age. Lower extremities:without edema or ischemic changes, no open wounds ; toes coo; skin very lax Neuro/psych:  Responds to questions appropriately but dozes off; not alert as usual Dialysis Access: right thigh AVGG + bruit  Dialysis Orders:  Center: [facility] TTS - 4 hours Optiflux 180 2K 2.25 Ca 450/A 1.5 heparin 5000 load with 2000 mid tmt; access = right thigh AVGG profile 4 EDW 92 Hecterol 3 Epogen 4200 no IV Fe Recent labs:  Hgb 10.6 6/19 (trending up) and iPTH 325 4/26 418 1/23  Assessment/Plan: 1. Leukocytosis/weakness presumed sepsis possibly related to HCAP with Vanc allergy; on empiric azithromycin q d, Fortaz q HD TTS, Zosyn (3.25 -one time dose) and Zyvox bid - ? Etiology; BC pending; Urine to be obtained; ^ BNP, tachy.  Liquid  stool being sent for c diff.  Need to narrow antibioitcs 2. ESRD -  TTS - continue usual dialysis orders today - K 5.8  quite hyperkalemic - recheck chemistries in the am; apparently given kayexalate in the ED at 1430 - not necessary because she is going to dialysis 3. Hypertension/volume  - generally cannot tolerate more than 2.5 - 3 L removed at a time without BP drops; orthstatic pos HD BP noted; pre HD BP today much lower than usual; on norvasc 5; Usual pre HD BP are 160 - 180 with pulse in the 90s; 3 kg above EDW per bed weight scales; would lower volume cautiously 4. Anemia  - Hgb 11.3 - no ESA yet 5. Metabolic bone disease -  Continue Hecterol and renvela 6. Nutrition - will need high protein renal diet   Sheffield Slider, PA-C [facility] Beeper [phone] 10/31/2012, 2:50 PM  I have seen and examined this patient and agree with plan per Bard Herbert.  80yo BF with ESRD CO cough, chills and just not feeling well.  WBC elevated and has bronchial  BS lt lung base though CXR not impressive so far.  BC done and empiric antibiotics started.  Plan HD tonight. Jhada Risk T,MD 10/31/2012 4:31 PM

## 2012-10-31 NOTE — Progress Notes (Signed)
ANTIBIOTIC CONSULT NOTE - INITIAL  Pharmacy Consult for Ceftazidime Indication: rule out pneumonia  Allergies  Allergen Reactions  . Codeine Hives  . Vancomycin    Vital Signs: Temp: 99.2 F (37.3 C) (06/24 1032) Temp src: Oral (06/24 1032) BP: 146/57 mmHg (06/24 1400) Pulse Rate: 107 (06/24 1400)    Labs:  Recent Labs  10/31/12 1220  WBC 21.9*  HGB 11.3*  PLT 225  CREATININE 11.99*   The CrCl is unknown because both a height and weight (above a minimum accepted value) are required for this calculation. No results found for this basename: VANCOTROUGH, VANCOPEAK, VANCORANDOM, GENTTROUGH, GENTPEAK, GENTRANDOM, TOBRATROUGH, TOBRAPEAK, TOBRARND, AMIKACINPEAK, AMIKACINTROU, AMIKACIN,  in the last 72 hours   Microbiology: No results found for this or any previous visit (from the past 720 hour(s)).  Medical History: Past Medical History  Diagnosis Date  . Diabetes mellitus   . Anemia   . Hyperparathyroidism   . Depression   . Obesity   . Osteoarthritis   . MRSA (methicillin resistant staph aureus) culture positive   . Gout   . C. difficile diarrhea   . Chronic kidney disease   . Diabetes mellitus 10/31/2012   Assessment: 79 y/o F who presents with weakness and shortness of breath. WBC 21.9, ESRD on HD(apparently missed HD today), LA 3.41, K 5.8, Tmax 99.2.   Pt also got Zosyn 3.375g IV x 1 in the ED 1340  Goal of Therapy:  Eradication of infection  Plan:  -Linezolid and Azithromycin per MD -Give Ceftazidime 2g IV at 2000, then 2g IV qHD -Trend WBC, temp, micro data -F/U HD schedule -F/U de-escalation plans  Thank you for allowing me to take part in this patient's care,  Abran Duke, PharmD Clinical Pharmacist Phone: [phone] Pager: [phone] 10/31/2012 2:59 PM

## 2012-10-31 NOTE — Progress Notes (Addendum)
ANTIBIOTIC CONSULT NOTE - INITIAL  Pharmacy Consult for Zosyn Indication: rule out pneumonia  Allergies  Allergen Reactions  . Codeine Hives  . Vancomycin    Vital Signs: Temp: 99.2 F (37.3 C) (06/24 1032) Temp src: Oral (06/24 1032) BP: 146/57 mmHg (06/24 1400) Pulse Rate: 107 (06/24 1400)    Labs:  Recent Labs  10/31/12 1220  WBC 21.9*  HGB 11.3*  PLT 225  CREATININE 11.99*   The CrCl is unknown because both a height and weight (above a minimum accepted value) are required for this calculation. No results found for this basename: VANCOTROUGH, VANCOPEAK, VANCORANDOM, GENTTROUGH, GENTPEAK, GENTRANDOM, TOBRATROUGH, TOBRAPEAK, TOBRARND, AMIKACINPEAK, AMIKACINTROU, AMIKACIN,  in the last 72 hours   Microbiology: No results found for this or any previous visit (from the past 720 hour(s)).  Medical History: Past Medical History  Diagnosis Date  . Diabetes mellitus   . Anemia   . Hyperparathyroidism   . Depression   . Obesity   . Osteoarthritis   . MRSA (methicillin resistant staph aureus) culture positive   . Gout   . C. difficile diarrhea   . Chronic kidney disease   . Diabetes mellitus 10/31/2012   Assessment: 79 y/o F who presents with weakness and shortness of breath. WBC 21.9, ESRD on HD(apparently missed HD today), LA 3.41, K 5.8, Tmax 99.2. Patient was initially on ceftazidime, linezolid and azithromycin, now changing therapy to linezolid and zosyn.    Pt received Zosyn 3.375g IV x 1 in the ED 1340  Goal of Therapy:  Eradication of infection  Plan:  Start Zosyn 2.25 grams IV q8hrs.  F/U HD schedule, renal func, clinical status.   Change in therapy per MD Zosyn dced now, changed to Levaquin. Start Levaquin 750mg  IV x 1, then 500mg  IV q48hrs.  Wendie Simmer, PharmD, BCPS Clinical Pharmacist  Pager: [phone]

## 2012-10-31 NOTE — ED Notes (Signed)
Pt was wheezing and sounds diminished on the right more than left.  Neb was started

## 2012-10-31 NOTE — Progress Notes (Signed)
Unit CM UR Completed by [facility] ED CM  W. Markus Casten RN  

## 2012-10-31 NOTE — ED Notes (Signed)
EDP states patient currently does not need BIpap.

## 2012-10-31 NOTE — ED Notes (Signed)
Lactic acid results called to [employer] operational officer in [place] for nurse Tammy Sours

## 2012-10-31 NOTE — H&P (Signed)
[facility] History and Physical  ALEXANDRA LIPPS ZOX:[id number] DOB: 16-Nov-1932 DOA: 10/31/2012  Referring physician: Dr Manus Gunning PCP: Irena Cords, MD  Specialists: Nephrology Dr Arrie Aran  Chief Complaint: cough/SOB/weakness  HPI: [name] is a 79 y.o. female with past medical history of diabetes, end-stage renal disease on hemodialysis Tuesday Thursday Saturday, hyperparathyroidism, depression, anemia who presents to the ED with a one to two-day history of productive cough of yellowish sputum, shortness of breath, generalized weakness, upper airway congestion and cough. Patient denies any fever, no chills, no nausea, no vomiting, no abdominal pain, no melena, no hematemesis, no hematochezia, no headache, no focal neurological symptoms. Patient denies any lower extremity swelling or pain. Patient does endorse decreased oral intake. Patient was seen in the ED compressive metabolic profile done had a potassium of 5.8 chloride of 92 BUN of 62 creatinine of 11.99 glucose of 166 and albumin of 2.9. First set of troponin was negative. Pro BNP was 81191. CBC done had a white count of 21.9 hemoglobin of 11.3 and a left shift otherwise was within normal limits. EKG done showed a sinus tachycardia with low voltage.  Chest x-ray which was done showed low volume in the chest. Scattered areas of predominantly linear density favored to represent atelectasis. ABG done showed a pH of 7.493, PCO2 of 31, PO2 of 63, bicarbonate of 24. We will call to admit the patient for further evaluation and management.  Review of Systems: The patient denies anorexia, fever, weight loss,, vision loss, decreased hearing, hoarseness, chest pain, syncope, dyspnea on exertion, peripheral edema, balance deficits, hemoptysis, abdominal pain, melena, hematochezia, severe indigestion/heartburn, hematuria, incontinence, genital sores, muscle weakness, suspicious skin lesions, transient blindness, difficulty walking,  depression, unusual weight change, abnormal bleeding, enlarged lymph nodes, angioedema, and breast masses.   Past Medical History  Diagnosis Date  . Diabetes mellitus   . Anemia   . Hyperparathyroidism   . Depression   . Obesity   . Osteoarthritis   . MRSA (methicillin resistant staph aureus) culture positive   . Gout   . C. difficile diarrhea   . Chronic kidney disease   . Diabetes mellitus 10/31/2012   Past Surgical History  Procedure Laterality Date  . Breast biopsy    . Shuntogram Right 09/06/12    Thigh graft  . Sp declot avgg Right Sep 14, 2012    Right thigh graft   Social History:  reports that she has never smoked. She has never used smokeless tobacco. She reports that she does not drink alcohol or use illicit drugs.  Allergies  Allergen Reactions  . Codeine Hives  . Vancomycin     Family History  Problem Relation Age of Onset  . Cancer Father   . Diabetes Sister     Prior to Admission medications   Medication Sig Start Date End Date Taking? Authorizing Provider  b complex-vitamin c-folic acid (NEPHRO-VITE) 0.8 MG TABS Take 0.8 mg by mouth at bedtime.     Yes Historical Provider, MD  sevelamer (RENVELA) 800 MG tablet Take 800 mg by mouth 3 (three) times daily with meals.     Yes Historical Provider, MD  allopurinol (ZYLOPRIM) 100 MG tablet Take 100 mg by mouth daily.      Historical Provider, MD  amLODipine (NORVASC) 5 MG tablet Take 5 mg by mouth daily.      Historical Provider, MD  triamcinolone (KENALOG) 0.1 % cream Apply 1 application topically 2 (two) times daily.      Historical Provider,  MD   Physical Exam: Filed Vitals:   10/31/12 1032 10/31/12 1117 10/31/12 1400  BP: 133/55  146/57  [name]: 115 113 107  Temp: 99.2 F (37.3 C)    TempSrc: Oral    Resp: 32 28 24  SpO2: 92% 96% 100%     General:  Well-developed well-nourished use of accessory muscles of respiration.  Eyes: Pupils equal round and reactive to light and accommodation. Extraocular  movements intact.  ENT: Oropharynx is clear, no lesions, no exudates. Dry mucous membranes.  Neck: Supple with no lymphadenopathy.  Cardiovascular: Tachycardic regular rhythm no murmurs rubs or gallops  Respiratory: Scattered coarse diffuse breath sounds. Shallow breaths.  Abdomen: Soft, nontender, nondistended, positive bowel sounds.  Skin: No rashes or lesions.  Musculoskeletal: 4/5 BUE strength,4/5 BLE strength.  Psychiatric: Normal mood. Normal affect. Fair insight. Fair judgment.  Neurologic: Alert and oriented x3. Cranial nerves II through XII are grossly intact. No focal deficits.  Labs on Admission:  Basic Metabolic Panel:  Recent Labs Lab 10/31/12 1220  NA 135  K 5.8*  CL 92*  CO2 22  GLUCOSE 166*  BUN 62*  CREATININE 11.99*  CALCIUM 9.1   Liver Function Tests:  Recent Labs Lab 10/31/12 1220  AST 14  ALT 8  ALKPHOS 91  BILITOT 0.9  PROT 7.5  ALBUMIN 2.9*   No results found for this basename: LIPASE, AMYLASE,  in the last 168 hours No results found for this basename: AMMONIA,  in the last 168 hours CBC:  Recent Labs Lab 10/31/12 1220  WBC 21.9*  NEUTROABS 19.6*  HGB 11.3*  HCT 35.6*  MCV 77.1*  PLT 225   Cardiac Enzymes:  Recent Labs Lab 10/31/12 1220  TROPONINI <0.30    BNP (last 3 results)  Recent Labs  10/31/12 1220  PROBNP 19230.0*   CBG: No results found for this basename: GLUCAP,  in the last 168 hours  Radiological Exams on Admission: Dg Chest Port 1 View  (if Code Sepsis Called)  10/31/2012   *RADIOLOGY REPORT*  Clinical Data: Short of breath for 2 days.  Weakness.  Diabetic patient.  PORTABLE CHEST - 1 VIEW  Comparison: 11/08/2008.  Findings: Low volume chest.  Areas of subsegmental atelectasis are present superimposed on scarring.  Density superior to the right hemidiaphragm is favored to represent subsegmental atelectasis, seen obliquely.  The cardiopericardial silhouette is within normal limits allowing for low  volumes.  No definite pleural effusion. No focal areas of consolidation.  IMPRESSION: Low volume chest.  Scattered areas of predominately linear density is favored to represent atelectasis based on lung volumes. Consider repeat PA and lateral chest with full inspiration when patient condition permits.   Original Report Authenticated By: Andreas Newport, M.D.    EKG: Independently reviewed. Low voltage, sinus tachycardia, poor R-wave progression.  Assessment/Plan Principal Problem:   Sepsis Active Problems:   ESRD on dialysis   CAP (community acquired pneumonia)   Hyperkalemia   Diabetes mellitus   Depression   Leukocytosis   Anemia   Tachycardia   Tachypnea   Acute respiratory distress  #1 sepsis Patient presented with a productive cough, upper airway congestion, leukocytosis with a white count of 21.9, elevated lactic acid level of 3.41, tachycardic with heart rates in the 150s, tachypneic with respiratory rate going up as high as 32. Blood cultures have been ordered and are pending. Will check a UA with cultures and sensitivities. Chest x-ray shows some linear atelectasis however patient's presentation is worrisome for community acquired  pneumonia. Will check a pro calcitonin levels. Serial lactic acid and profile stone or levels. We'll place empirically on IV Fortaz and IV azithromycin. Follow.  #2 acute respiratory distress Patient presented with shortness of breath productive cough was tachypneic and tachycardic using some accessory muscles of respiration. Questionable etiology. Likely secondary to #1 versus a probable community-acquired pneumonia. ABG which was done had a pH of 7.49, PCO2 of 31, PO2 of 63 and a bicarbonate of 24. Will panculture. We'll place empirically on IV Fortaz and IV azithromycin. Oxygen, nebs, Mucinex, Claritin. Follow closely. Will consult critical care for further evaluation and management.  #3 hyperkalemia Will give a dose of Kayexalate. No significant EKG  changes. Follow.  #4 end-stage renal disease on hemodialysis Patient has dialysis Tuesday, Thursdays and Saturdays. Will consult with nephrology.  #5 diabetes mellitus Check a hemoglobin A1c. Will place on sliding scale insulin.  #6 probable community-acquired pneumonia Patient presenting with upper respiratory symptoms including congestion productive cough shortness of breath. Patient also noted to have a leukocytosis. Will check a sputum Gram stain and culture. Check a urine Legionella antigen. Check a urine pneumococcus antigen. Blood cultures are pending. Will place empirically on IV Fortaz and IV azithromycin. Place on oxygen, nebulizer treatments, Mucinex and Claritin.  #7 anemia Likely secondary to anemia of chronic disease secondary to end-stage renal disease. No overt GI bleed. Follow H&H.  #8 HTN Norvasc  #9 Depression Stable.  #10 prophylaxis PPI for GI prophylaxis. Heparin for DVT prophylaxis.    Code Status: Full Family Communication: updated patient, no family at bedside. Disposition Plan: Admit to SDU  Time spent: 70 mins  [facility] Pager [phone]  If 7PM-7AM, please contact night-coverage [email or web address] Password [email or web address] 10/31/2012, 2:50 PM

## 2012-10-31 NOTE — ED Provider Notes (Signed)
History    CSN: [id number] Arrival date & time 10/31/12  1024  First MD Initiated Contact with Patient 10/31/12 1117     Chief Complaint  Patient presents with  . Shortness of Breath  . Weakness   (Consider location/radiation/quality/duration/timing/severity/associated sxs/prior Treatment) HPI Comments: Patient presents with 2 day history of shortness of breath, generalized weakness, upper airway congestion and cough. She denies any chest pain, headache, abdominal pain, nausea vomiting. She missed dialysis today because she doesn't feel well. Her last dialysis was Saturday. She denies any fever, leg pain or swelling. She states she has not had much of an appetite and does not feel like eating. She denies any previous history of heart or lung problems. She denies any recent sick contacts.  The history is provided by the patient.   Past Medical History  Diagnosis Date  . Diabetes mellitus   . Anemia   . Hyperparathyroidism   . Depression   . Obesity   . Osteoarthritis   . MRSA (methicillin resistant staph aureus) culture positive   . Gout   . C. difficile diarrhea   . Chronic kidney disease    Past Surgical History  Procedure Laterality Date  . Breast biopsy    . Shuntogram Right 09/06/12    Thigh graft  . Sp declot avgg Right Sep 14, 2012    Right thigh graft   Family History  Problem Relation Age of Onset  . Cancer Father   . Diabetes Sister    History  Substance Use Topics  . Smoking status: Never Smoker   . Smokeless tobacco: Never Used  . Alcohol Use: No   OB History   Grav Para Term Preterm Abortions TAB SAB Ect Mult Living                 Review of Systems  Constitutional: Positive for activity change, appetite change and fatigue. Negative for fever.  HENT: Positive for congestion and rhinorrhea.   Respiratory: Positive for cough and shortness of breath. Negative for chest tightness.   Cardiovascular: Negative for chest pain.  Gastrointestinal: Negative  for nausea, vomiting and abdominal pain.  Genitourinary: Negative for dysuria, hematuria, vaginal bleeding and vaginal discharge.  Musculoskeletal: Positive for arthralgias. Negative for back pain.  Skin: Negative for wound.  Neurological: Positive for dizziness and weakness. Negative for headaches.  A complete 10 system review of systems was obtained and all systems are negative except as noted in the HPI and PMH.    Allergies  Codeine and Vancomycin  Home Medications   Current Outpatient Rx  Name  Route  Sig  Dispense  Refill  . b complex-vitamin c-folic acid (NEPHRO-VITE) 0.8 MG TABS   Oral   Take 0.8 mg by mouth at bedtime.           . sevelamer (RENVELA) 800 MG tablet   Oral   Take 800 mg by mouth 3 (three) times daily with meals.           Marland Kitchen allopurinol (ZYLOPRIM) 100 MG tablet   Oral   Take 100 mg by mouth daily.           Marland Kitchen amLODipine (NORVASC) 5 MG tablet   Oral   Take 5 mg by mouth daily.           Marland Kitchen triamcinolone (KENALOG) 0.1 % cream   Topical   Apply 1 application topically 2 (two) times daily.  BP 133/55  Pulse 113  Temp(Src) 99.2 F (37.3 C) (Oral)  Resp 28  SpO2 96% Physical Exam  Constitutional: She is oriented to person, place, and time. She appears well-developed and well-nourished. She appears distressed.  Mild tachypnea, upper airway congestion  HENT:  Head: Normocephalic and atraumatic.  Mouth/Throat: Oropharynx is clear and moist. No oropharyngeal exudate.  Eyes: Conjunctivae and EOM are normal. Pupils are equal, round, and reactive to light.  Neck: Normal range of motion. Neck supple.  Cardiovascular: Normal rate, regular rhythm and normal heart sounds.   tachycardia  Pulmonary/Chest: Breath sounds normal. She is in respiratory distress.  rhonchorous breath sounds  Abdominal: Soft. There is no tenderness. There is no rebound and no guarding.  Musculoskeletal: Normal range of motion. She exhibits no edema and no  tenderness.  Neurological: She is alert and oriented to person, place, and time. No cranial nerve deficit. She exhibits normal muscle tone. Coordination normal.  Skin: Skin is warm.    ED Course  Procedures (including critical care time) Labs Reviewed  CBC WITH DIFFERENTIAL - Abnormal; Notable for the following:    WBC 21.9 (*)    Hemoglobin 11.3 (*)    HCT 35.6 (*)    MCV 77.1 (*)    MCH 24.5 (*)    RDW 15.8 (*)    Neutrophils Relative % 89 (*)    Neutro Abs 19.6 (*)    Lymphocytes Relative 4 (*)    Monocytes Absolute 1.5 (*)    All other components within normal limits  COMPREHENSIVE METABOLIC PANEL - Abnormal; Notable for the following:    Potassium 5.8 (*)    Chloride 92 (*)    Glucose, Bld 166 (*)    BUN 62 (*)    Creatinine, Ser 11.99 (*)    Albumin 2.9 (*)    GFR calc non Af Amer 3 (*)    GFR calc Af Amer 3 (*)    All other components within normal limits  PRO B NATRIURETIC PEPTIDE - Abnormal; Notable for the following:    Pro B Natriuretic peptide (BNP) 19230.0 (*)    All other components within normal limits  CG4 I-STAT (LACTIC ACID) - Abnormal; Notable for the following:    Lactic Acid, Venous 3.41 (*)    All other components within normal limits  POCT I-STAT 3, BLOOD GAS (G3+) - Abnormal; Notable for the following:    pH, Arterial 7.493 (*)    pCO2 arterial 31.4 (*)    pO2, Arterial 63.0 (*)    Bicarbonate 24.1 (*)    All other components within normal limits  CULTURE, BLOOD (ROUTINE X 2)  CULTURE, BLOOD (ROUTINE X 2)  PROTIME-INR  TROPONIN I   Dg Chest Port 1 View  (if Code Sepsis Called)  10/31/2012   *RADIOLOGY REPORT*  Clinical Data: Short of breath for 2 days.  Weakness.  Diabetic patient.  PORTABLE CHEST - 1 VIEW  Comparison: 11/08/2008.  Findings: Low volume chest.  Areas of subsegmental atelectasis are present superimposed on scarring.  Density superior to the right hemidiaphragm is favored to represent subsegmental atelectasis, seen obliquely.  The  cardiopericardial silhouette is within normal limits allowing for low volumes.  No definite pleural effusion. No focal areas of consolidation.  IMPRESSION: Low volume chest.  Scattered areas of predominately linear density is favored to represent atelectasis based on lung volumes. Consider repeat PA and lateral chest with full inspiration when patient condition permits.   Original Report Authenticated By: Andreas Newport,  M.D.   No diagnosis found.  MDM  2 day history of cough, congestion, shortness of breath and generalized weakness. Missed dialysis today. No chest pain or abdominal pain.  Wheezing with upper airway congestion on exam. She is given a nebulizer as well as racemic epinephrine. She has mild increased work of breathing and tachycardia. Leukocytosis with possible infiltrate in right lower lobe. Cultures obtained, broad spectrum antibiotics started. No CO2 retention on ABG. Patient presenting with upper respiratory symptoms including congestion productive cough shortness of breath. Patient also noted to have a leukocytosis. Will continue broad spectrum antibioitics.  D/w Janee Morn who requested that critical care consults.  D/w Dr. Vassie Loll who will consult but feels patient is appropriate for stepdown.   Date: 10/31/2012  Rate: 112  Rhythm: sinus tachycardia  QRS Axis: left  Intervals: normal  ST/T Wave abnormalities: normal  Conduction Disutrbances:none  Narrative Interpretation:   Old EKG Reviewed: unchanged  CRITICAL CARE Performed by: Glynn Octave Total critical care time: 30 Critical care time was exclusive of separately billable procedures and treating other patients. Critical care was necessary to treat or prevent imminent or life-threatening deterioration. Critical care was time spent personally by me on the following activities: development of treatment plan with patient and/or surrogate as well as nursing, discussions with consultants, evaluation of patient's  response to treatment, examination of patient, obtaining history from patient or surrogate, ordering and performing treatments and interventions, ordering and review of laboratory studies, ordering and review of radiographic studies, pulse oximetry and re-evaluation of patient's condition.         Glynn Octave, MD 10/31/12 [phone]

## 2012-10-31 NOTE — Consult Note (Signed)
PULMONARY  / CRITICAL CARE MEDICINE  Name: [name] MRN: [id number] DOB: 1932/05/20    ADMISSION DATE:  10/31/2012 CONSULTATION DATE:  6/24   REFERRING MD :  Dr. Janee [name] PRIMARY SERVICE: TRH  CHIEF COMPLAINT:  Concern for Sepsis  BRIEF PATIENT DESCRIPTION: 79 y/o AAF with PMH of ESRD who presented to [facility] ER on 6/24 with URI type symptoms and intermittent diarrhea.  Admitted per [facility] with concern for sepsis.   SIGNIFICANT EVENTS / STUDIES:  6/24 - Admit with URI / Diarrhea  LINES / TUBES:   CULTURES: 6/24 BCx2>>> 6/24 C-Diff>>>  ANTIBIOTICS: Linezolid 6/24>>> Zosyn 6/24>>>  HISTORY OF PRESENT ILLNESS:  79 y/o AAF with PMH of ESRD on HD T/Th/S, DM, Depression, Anemia, who presented to [facility] ER on 6/24 with a two day history of cough, intermittent yellow sputum, mild shortness of breath, weakness, congestion and barking cough.  Pt reports symptoms started on Monday from a respiratory standpoint.  She denies nausea / vomiting, constipation, melena, chest pain, pain with inspiration.  She reports she has had diarrhea intermittently for greater than one month and has been in depends for over two years at baseline.    Work up demonstrated findings consistent with chronic renal failure, mild hyperkalemia, negative troponin, WBC of 21k, stable H/H and elevated BNP.   Patients BP normally runs in 160's systolic per Renal PA.  Currently is normotensive with good mental status.     PAST MEDICAL HISTORY :  Past Medical History  Diagnosis Date  . Diabetes mellitus   . Anemia   . Hyperparathyroidism   . Depression   . Obesity   . Osteoarthritis   . MRSA (methicillin resistant staph aureus) culture positive   . Gout   . C. difficile diarrhea   . Chronic kidney disease   . Diabetes mellitus 10/31/2012   Past Surgical History  Procedure Laterality Date  . Breast biopsy    . Shuntogram Right 09/06/12    Thigh graft  . Sp declot avgg Right Sep 14, 2012    Right thigh graft   Prior  to Admission medications   Medication Sig Start Date End Date Taking? Authorizing Provider  b complex-vitamin c-folic acid (NEPHRO-VITE) 0.8 MG TABS Take 0.8 mg by mouth at bedtime.     Yes Historical Provider, MD  sevelamer (RENVELA) 800 MG tablet Take 800 mg by mouth 3 (three) times daily with meals.     Yes Historical Provider, MD  allopurinol (ZYLOPRIM) 100 MG tablet Take 100 mg by mouth daily.      Historical Provider, MD  amLODipine (NORVASC) 5 MG tablet Take 5 mg by mouth daily.      Historical Provider, MD  triamcinolone (KENALOG) 0.1 % cream Apply 1 application topically 2 (two) times daily.      Historical Provider, MD   Allergies  Allergen Reactions  . Codeine Hives  . Vancomycin     FAMILY HISTORY:  Family History  Problem Relation Age of Onset  . Cancer Father   . Diabetes Sister    SOCIAL HISTORY:  reports that she has never smoked. She has never used smokeless tobacco. She reports that she does not drink alcohol or use illicit drugs.  REVIEW OF SYSTEMS:  See HPI for pertinent positives.  All other systems reviewed and are negative.   SUBJECTIVE: Pt denies pain, abdominal cramping, SOB.   VITAL SIGNS: Temp:  [99.2 F (37.3 C)] 99.2 F (37.3 C) (06/24 1032) Pulse Rate:  [107-115] 107 (  06/24 1400) Resp:  [24-32] 24 (06/24 1400) BP: (133-146)/(55-57) 146/57 mmHg (06/24 1400) SpO2:  [92 %-100 %] 100 % (06/24 1400)  HEMODYNAMICS:    VENTILATOR SETTINGS:    INTAKE / OUTPUT: Intake/Output   None     PHYSICAL EXAMINATION: General:  wdwn adult female, chronically ill appearing Neuro:  AAOx4, speech clear, MAE HEENT:  Mm pink/dry, no jvd Cardiovascular:  Tachy, reg, no M noted Lungs: R basilar rales, prominent bronchial BS in LLL, no wheezes Abdomen:  Soft, NT, NABS Ext: no edema, warm   LABS:  Recent Labs Lab 10/31/12 1150 10/31/12 1219 10/31/12 1220 10/31/12 1228  HGB  --   --  11.3*  --   WBC  --   --  21.9*  --   PLT  --   --  225  --   NA   --   --  135  --   K  --   --  5.8*  --   CL  --   --  92*  --   CO2  --   --  22  --   GLUCOSE  --   --  166*  --   BUN  --   --  62*  --   CREATININE  --   --  11.99*  --   CALCIUM  --   --  9.1  --   AST  --   --  14  --   ALT  --   --  8  --   ALKPHOS  --   --  91  --   BILITOT  --   --  0.9  --   PROT  --   --  7.5  --   ALBUMIN  --   --  2.9*  --   INR 1.21  --   --   --   LATICACIDVEN  --  3.41*  --   --   TROPONINI  --   --  <0.30  --   PROBNP  --   --  19230.0*  --   PHART  --   --   --  7.493*  PCO2ART  --   --   --  31.4*  PO2ART  --   --   --  63.0*   CXR: 6/24 - low lung volumes, ? RLL opacity vs bibasilar atx  ASSESSMENT / PLAN:  PULMONARY A: ? Viral URI vs HCAP  P:   -pulmonary hygiene -see ID section -f/u cxr in am -BiPap in ER -no further racemic epi   CARDIOVASCULAR A:  Sinus Tachycardia - likely related to decreased intake + acute illness.  Pressures have been stable  P:  -monitor / supportive care   RENAL A:   Chronic Renal Failure Mild Hyperkalemia   P:   -HD per Nephrology  GASTROINTESTINAL A:   Diarrhea   P:   -see ID  HEMATOLOGIC A:   Mild Anemia - no evidence of bleeding, likely volume depletion + chronic illness  P:  -follow H/H  INFECTIOUS A:   Diarrhea  URI vs HCAP Leukocytosis Hx of C-Diff / MRSA infections P:   -culture blood, stool PCR for C-diff -follow CXR -adjust abx as above -may need abx coverage for c-diff.  Received KAYEXALATE in ER.  Will need to clarify allergy to vanco  ENDOCRINE A:   DM  P:   -SSI  NEUROLOGIC A:   P:   -supportive Care  Jamie  Veleta Miners NP-C [facility] Pgr: [phone] or [phone]  PCCM ATTENDING I have interviewed and examined the patient and reviewed the database. I have formulated the assessment and plan as reflected in the note above with amendments made by me.   Her PCT is markedly elevated c/w sepsis. Her symtpoms and chest exam are  highly suggestive of LLL PNA. C diff is positive but I suspect this is not the primary problem making her acutely ill. She provides a history of chronic intermittent diarrhea suggesting chronic C diff infection. She has a history of positive nasal swab for MRSA and documentation of vancomycin allergy (Rash and skin ulcerations)  Rec: 1) Linezolid and levofloxacin for presumed LLL PNA 2) PO metronidazole for C diff 3) Recheck CXR AM 6/25 4) at risk for worsening sepsis - please exercise caution with long acting anti-hypertensives (amlodipine) 5) Would consider ID or GI consult at some point to address what is likely chronic C diff infection  Billy Fischer, MD;  PCCM service; Mobile [phone]

## 2012-10-31 NOTE — ED Notes (Signed)
Pt is here with barking cough, shortness of breath, and weakness that started yesterday.  Pt appears short of breath.  States missed HD today because she felt so bad

## 2012-11-01 ENCOUNTER — Inpatient Hospital Stay (HOSPITAL_COMMUNITY): Payer: Medicare Other

## 2012-11-01 LAB — CBC WITH DIFFERENTIAL/PLATELET
Basophils Absolute: 0 10*3/uL (ref 0.0–0.1)
Basophils Relative: 0 % (ref 0–1)
Eosinophils Relative: 0 % (ref 0–5)
HCT: 29.3 % — ABNORMAL LOW (ref 36.0–46.0)
MCHC: 31.4 g/dL (ref 30.0–36.0)
Monocytes Absolute: 1.3 10*3/uL — ABNORMAL HIGH (ref 0.1–1.0)
Neutro Abs: 15 10*3/uL — ABNORMAL HIGH (ref 1.7–7.7)
Platelets: 202 10*3/uL (ref 150–400)
RDW: 15.5 % (ref 11.5–15.5)

## 2012-11-01 LAB — BASIC METABOLIC PANEL
Calcium: 8.9 mg/dL (ref 8.4–10.5)
Chloride: 94 mEq/L — ABNORMAL LOW (ref 96–112)
Creatinine, Ser: 6.22 mg/dL — ABNORMAL HIGH (ref 0.50–1.10)
GFR calc Af Amer: 7 mL/min — ABNORMAL LOW (ref >90)
Sodium: 136 mEq/L (ref 135–145)

## 2012-11-01 LAB — GLUCOSE, CAPILLARY: Glucose-Capillary: 185 mg/dL — ABNORMAL HIGH (ref 70–99)

## 2012-11-01 LAB — LACTIC ACID, PLASMA: Lactic Acid, Venous: 2 mmol/L (ref 0.5–2.2)

## 2012-11-01 MED ORDER — DARBEPOETIN ALFA-POLYSORBATE 40 MCG/0.4ML IJ SOLN
40.0000 ug | INTRAMUSCULAR | Status: DC
  Administered 2012-11-02: 40 ug via INTRAVENOUS
  Filled 2012-11-01: qty 0.4

## 2012-11-01 MED ORDER — LEVALBUTEROL HCL 0.63 MG/3ML IN NEBU
0.6300 mg | INHALATION_SOLUTION | Freq: Three times a day (TID) | RESPIRATORY_TRACT | Status: DC
  Administered 2012-11-02 – 2012-11-03 (×4): 0.63 mg via RESPIRATORY_TRACT
  Filled 2012-11-01 (×7): qty 3

## 2012-11-01 NOTE — Progress Notes (Signed)
Nutrition Education Note  RD drawn to chart secondary to current prescription of Zyvox, a medication with potential interactions with high tyramine-containing foods.  RD provided "Tyramine-Restricted Nutrition Therapy" handout from the [employer] of [employer] and discussed importance of this restriction while taking current medication. Reviewed list of foods to avoid. Teach back method used.  Expect good compliance.  Body mass index is 36.29 kg/(m^2). Pt meets criteria for class 2 obesity based on current BMI.  Current diet order is Renal 80/90-2-2, intake is good. This diet is not high in tyramine. Additional labs and medications reviewed.   No further nutrition interventions warranted at this time. RD contact information provided. If additional nutrition issues arise, please re-consult RD.  Joaquin Courts, RD, LDN, CNSC Pager [phone] After Hours Pager [phone]

## 2012-11-01 NOTE — Progress Notes (Signed)
[facility] Progress Note [place] TEAM 1 - Stepdown/ICU TEAM   [name] ZOX:[id number] DOB: 1932-10-18 DOA: 10/31/2012 PCP: Irena Cords, MD  Brief narrative: 79 y.o. female with past medical history of diabetes, end-stage renal disease on hemodialysis Tuesday Thursday Saturday, hyperparathyroidism, depression, anemia who presented to the ED with a one to two-day history of productive cough of yellowish sputum, shortness of breath, generalized weakness, and upper airway congestion. Patient denied fever or chills, no nausea, no vomiting, no abdominal pain, no melena, no hematemesis, no hematochezia, no headache, no focal neurological symptoms. Patient denied any lower extremity swelling or pain.   In the ED compressive metabolic profile done had a potassium of 5.8 chloride of 92 BUN of 62 creatinine of 11.99 glucose of 166 and albumin of 2.9. First set of troponin was negative. Pro BNP was 81191. CBC done had a white count of 21.9 hemoglobin of 11.3 and a left shift otherwise was within normal limits. EKG done showed a sinus tachycardia with low voltage. Chest x-ray which was done showed low volume in the chest. Scattered areas of predominantly linear density favored to represent atelectasis. ABG done showed a pH of 7.493, PCO2 of 31, PO2 of 63, bicarbonate of 24.   Assessment/Plan:  Sepsis - due to CAP vs/ C diff coliits Blood pressure remains borderline but clinically patient is improving otherwise  Hypoxic resp failure due to CAP Continue empiric antibiotic therapy - followup chest x-ray in a.m. - no clear infiltrate consider early discontinuation of antibiotics in setting of active C. difficile infection  C diff colitis in setting of chronic diarrhea since 2008 Continue empiric Flagyl therapy with plan for prolonged treatment course  Gram + cocci in 1/2 blood cx Empiric linezolid (due to vancomycin allergy) until speciation available - of note, pt is MRSA screen  negative  Hypotension Due to above - d/c norvasc   ESRD on dialysis dialysis Tuesday, Thursdays and Saturdays - Nephrology following  Hyperkalemia Resolved w/ HD as expected   Diabetes mellitus A1c 5.7 - CBG currently reasonably controlled  Anemia anemia of chronic disease secondary to end-stage renal disease  Obesity  Code Status: FULL Family Communication: No family present at time of exam Disposition Plan: SDU  Consultants: Nephrology PCCM  Procedures: None  Antibiotics: Linezolid 6/24>>>  Zosyn 6/24 Levaquin 6/24 >> Flagyl po 6/24 >>  DVT prophylaxis: SQ heparin  HPI/Subjective: Patient is awake and interactive.  She denies specific complaints at this time.  She states that she is feeling much better overall.  Objective: Blood pressure 89/47, pulse 91, temperature 98 F (36.7 C), temperature source Oral, resp. rate 22, height 5\' 3"  (1.6 m), weight 92.9 kg (204 lb 12.9 oz), SpO2 100.00%.  Intake/Output Summary (Last 24 hours) at 11/01/12 1704 Last data filed at 11/01/12 0919  Gross per 24 hour  Intake    100 ml  Output    355 ml  Net   -255 ml    Exam: General: No acute respiratory distress Lungs: Clear to auscultation bilaterally without wheezes or crackles Cardiovascular: Regular rate and rhythm without murmur gallop or rub normal S1 and S2 Abdomen: Obese, nontender, nondistended, soft, bowel sounds positive, no rebound, no ascites, no appreciable mass Extremities: No significant cyanosis, clubbing, or edema bilateral lower extremities  Data Reviewed: Basic Metabolic Panel:  Recent Labs Lab 10/31/12 1220 10/31/12 1706 11/01/12 0435  NA 135 136 136  K 5.8* 5.8* 4.3  CL 92* 92* 94*  CO2 22 22 31  GLUCOSE 166* 206* 164*  BUN 62* 68* 26*  CREATININE 11.99* 13.09* 6.22*  CALCIUM 9.1 9.0 8.9  MG  --  2.2  --   PHOS  --   --  6.5*   Liver Function Tests:  Recent Labs Lab 10/31/12 1220  AST 14  ALT 8  ALKPHOS 91  BILITOT 0.9  PROT  7.5  ALBUMIN 2.9*   CBC:  Recent Labs Lab 10/31/12 1220 10/31/12 1706 11/01/12 0435  WBC 21.9* 16.4* 17.3*  NEUTROABS 19.6*  --  15.0*  HGB 11.3* 10.5* 9.2*  HCT 35.6* 32.6* 29.3*  MCV 77.1* 75.5* 76.7*  PLT 225 232 202   Cardiac Enzymes:  Recent Labs Lab 10/31/12 1220  TROPONINI <0.30   BNP (last 3 results)  Recent Labs  10/31/12 1220  PROBNP 19230.0*   CBG:  Recent Labs Lab 10/31/12 1532 11/01/12 0012 11/01/12 0828 11/01/12 1223  GLUCAP 185* 130* 179* 96    Recent Results (from the past 240 hour(s))  CULTURE, BLOOD (ROUTINE X 2)     Status: None   Collection Time    10/31/12 12:40 PM      Result Value Range Status   Specimen Description BLOOD ARM LEFT   Final   Special Requests BOTTLES DRAWN AEROBIC ONLY 5CC   Final   Culture  Setup Time 10/31/2012 17:22   Final   Culture     Final   Value: GRAM POSITIVE COCCI IN CLUSTERS     Note: Gram Stain Report Called to,Read Back By and Verified With: DANIELLE BYERLY 11/01/12 1100 BY SMITHERSJ   Report Status PENDING   Incomplete  CULTURE, BLOOD (ROUTINE X 2)     Status: None   Collection Time    10/31/12 12:45 PM      Result Value Range Status   Specimen Description BLOOD HAND LEFT   Final   Special Requests BOTTLES DRAWN AEROBIC ONLY 5CC   Final   Culture  Setup Time 10/31/2012 17:22   Final   Culture     Final   Value:        BLOOD CULTURE RECEIVED NO GROWTH TO DATE CULTURE WILL BE HELD FOR 5 DAYS BEFORE ISSUING A FINAL NEGATIVE REPORT   Report Status PENDING   Incomplete  MRSA PCR SCREENING     Status: None   Collection Time    10/31/12  3:33 PM      Result Value Range Status   MRSA by PCR NEGATIVE  NEGATIVE Final   Comment:            The GeneXpert MRSA Assay (FDA     approved for NASAL specimens     only), is one component of a     comprehensive MRSA colonization     surveillance program. It is not     intended to diagnose MRSA     infection nor to guide or     monitor treatment for     MRSA  infections.  CLOSTRIDIUM DIFFICILE BY PCR     Status: Abnormal   Collection Time    10/31/12  3:33 PM      Result Value Range Status   C difficile by pcr POSITIVE (*) NEGATIVE Final   Comment: CRITICAL RESULT CALLED TO, READ BACK BY AND VERIFIED WITH:     D. FIELDS RN 17:10 10/31/12 ([email or web address])     Studies:  Recent x-ray studies have been reviewed in detail by the Attending Physician  Scheduled Meds:  Scheduled  Meds: . allopurinol  100 mg Oral Daily  . [START ON 11/02/2012] darbepoetin (ARANESP) injection - DIALYSIS  40 mcg Intravenous Q Thu-HD  . doxercalciferol  3 mcg Intravenous Q T,Th,Sa-HD  . feeding supplement (NEPRO CARB STEADY)  237 mL Oral BID BM  . guaiFENesin  1,200 mg Oral BID  . heparin  5,000 Units Subcutaneous Q8H  . insulin aspart  0-9 Units Subcutaneous TID WC  . levalbuterol  0.63 mg Nebulization Q6H  . [START ON 11/02/2012] levofloxacin (LEVAQUIN) IV  500 mg Intravenous Q48H  . linezolid  600 mg Intravenous Q12H  . loratadine  10 mg Oral Daily  . metroNIDAZOLE  500 mg Oral Q8H  . multivitamin  1 tablet Oral QHS  . sevelamer carbonate  800 mg Oral TID WC  . sodium chloride  3 mL Intravenous Q12H  . triamcinolone cream  1 application Topical BID   Time spent on care of this patient:   [facility] T  [facility] Office  [phone] Pager - Text Page per Loretha Stapler as per below:  On-Call/Text Page:      [email or web address]      password [email or web address]  If 7PM-7AM, please contact night-coverage [email or web address] Password [email or web address] 11/01/2012, 5:04 PM   LOS: 1 day

## 2012-11-01 NOTE — Progress Notes (Signed)
S: feeling better but having diarrhea O:BP 92/38  Pulse 96  Temp(Src) 98.6 F (37 C) (Oral)  Resp 26  Ht 5\' 3"  (1.6 m)  Wt 92.9 kg (204 lb 12.9 oz)  BMI 36.29 kg/m2  SpO2 100%  Intake/Output Summary (Last 24 hours) at 11/01/12 0847 Last data filed at 10/31/12 2315  Gross per 24 hour  Intake      0 ml  Output    358 ml  Net   -358 ml   Weight change:  YNW:GNFAO and alert CVS:RRR Resp: basilar crackles l>r Abd:+ BS NTND Ext: no edema  Rt thigh AVG + bruit NEURO:CNI Ox3 no asterixis   . acetaminophen      . allopurinol  100 mg Oral Daily  . amLODipine  5 mg Oral Daily  . doxercalciferol      . doxercalciferol  3 mcg Intravenous Q T,Th,Sa-HD  . feeding supplement (NEPRO CARB STEADY)  237 mL Oral BID BM  . guaiFENesin  1,200 mg Oral BID  . heparin  5,000 Units Subcutaneous Q8H  . insulin aspart  0-9 Units Subcutaneous TID WC  . levalbuterol  0.63 mg Nebulization Q6H  . [START ON 11/02/2012] levofloxacin (LEVAQUIN) IV  500 mg Intravenous Q48H  . levofloxacin (LEVAQUIN) IV  750 mg Intravenous Once  . linezolid  600 mg Intravenous Q12H  . loratadine  10 mg Oral Daily  . metroNIDAZOLE  500 mg Oral Q8H  . multivitamin  1 tablet Oral QHS  . sevelamer carbonate  800 mg Oral TID WC  . sodium chloride  3 mL Intravenous Q12H  . triamcinolone cream  1 application Topical BID   Dg Chest Port 1 View  11/01/2012   *RADIOLOGY REPORT*  Clinical Data: Pneumonia, follow-up  PORTABLE CHEST - 1 VIEW  Comparison: Portable chest x-ray of 10/31/2012  Findings: The lungs are not as well aerated with increase in basilar atelectasis.  Cardiomegaly is present and mild pulmonary vascular congestion cannot be excluded.  IMPRESSION: Diminished aeration with increase in basilar atelectasis.  Cannot exclude pneumonia.  Also mild congestion cannot be excluded.   Original Report Authenticated By: Dwyane Dee, M.D.   Dg Chest Port 1 View  (if Code Sepsis Called)  10/31/2012   *RADIOLOGY REPORT*  Clinical  Data: Short of breath for 2 days.  Weakness.  Diabetic patient.  PORTABLE CHEST - 1 VIEW  Comparison: 11/08/2008.  Findings: Low volume chest.  Areas of subsegmental atelectasis are present superimposed on scarring.  Density superior to the right hemidiaphragm is favored to represent subsegmental atelectasis, seen obliquely.  The cardiopericardial silhouette is within normal limits allowing for low volumes.  No definite pleural effusion. No focal areas of consolidation.  IMPRESSION: Low volume chest.  Scattered areas of predominately linear density is favored to represent atelectasis based on lung volumes. Consider repeat PA and lateral chest with full inspiration when patient condition permits.   Original Report Authenticated By: Andreas Newport, M.D.   BMET    Component Value Date/Time   NA 136 11/01/2012 0435   K 4.3 11/01/2012 0435   CL 94* 11/01/2012 0435   CO2 31 11/01/2012 0435   GLUCOSE 164* 11/01/2012 0435   BUN 26* 11/01/2012 0435   CREATININE 6.22* 11/01/2012 0435   CREATININE 4.24* 12/26/2006 1500   CALCIUM 8.9 11/01/2012 0435   CALCIUM 8.9 12/05/2006 1357   GFRNONAA 6* 11/01/2012 0435   GFRAA 7* 11/01/2012 0435   CBC    Component Value Date/Time   WBC 17.3*  11/01/2012 0435   WBC 20.6* 10/10/2006 0835   RBC 3.82* 11/01/2012 0435   RBC 4.17 10/10/2006 0835   HGB 9.2* 11/01/2012 0435   HGB 10.1* 10/10/2006 0835   HCT 29.3* 11/01/2012 0435   HCT 32.0* 10/10/2006 0835   PLT 202 11/01/2012 0435   PLT 605* 10/10/2006 0835   MCV 76.7* 11/01/2012 0435   MCV 76.7* 10/10/2006 0835   MCH 24.1* 11/01/2012 0435   MCH 24.1* 10/10/2006 0835   MCHC 31.4 11/01/2012 0435   MCHC 31.5* 10/10/2006 0835   RDW 15.5 11/01/2012 0435   RDW 17.2* 10/10/2006 0835   LYMPHSABS 0.9 11/01/2012 0435   LYMPHSABS 0.9 10/10/2006 0835   MONOABS 1.3* 11/01/2012 0435   MONOABS 0.7 10/10/2006 0835   EOSABS 0.0 11/01/2012 0435   EOSABS 0.0 10/10/2006 0835   BASOSABS 0.0 11/01/2012 0435   BASOSABS 0.0 10/10/2006 0835     Assessment: 1. C Diff  colitis on metronidazole 2. Anemia start aranesp 3. Sec HPTH on hectorol 4. HTN though BP low now 5. ESRD  Plan: 1. HD in AM and will recheck labs at this time 2. Start aranesp 3. Hold amlodipine for SBP<120    Delorice Bannister T

## 2012-11-02 ENCOUNTER — Inpatient Hospital Stay (HOSPITAL_COMMUNITY): Payer: Medicare Other

## 2012-11-02 DIAGNOSIS — J209 Acute bronchitis, unspecified: Secondary | ICD-10-CM

## 2012-11-02 DIAGNOSIS — N186 End stage renal disease: Secondary | ICD-10-CM

## 2012-11-02 LAB — GLUCOSE, CAPILLARY
Glucose-Capillary: 195 mg/dL — ABNORMAL HIGH (ref 70–99)
Glucose-Capillary: 118 mg/dL — ABNORMAL HIGH (ref 70–99)
Glucose-Capillary: 225 mg/dL — ABNORMAL HIGH (ref 70–99)

## 2012-11-02 LAB — CBC
Hemoglobin: 8.7 g/dL — ABNORMAL LOW (ref 12.0–15.0)
MCH: 23.7 pg — ABNORMAL LOW (ref 26.0–34.0)
MCV: 77.1 fL — ABNORMAL LOW (ref 78.0–100.0)
RBC: 3.67 MIL/uL — ABNORMAL LOW (ref 3.87–5.11)

## 2012-11-02 LAB — PROCALCITONIN: Procalcitonin: 174.88 ng/mL

## 2012-11-02 LAB — BASIC METABOLIC PANEL
CO2: 27 mEq/L (ref 19–32)
Glucose, Bld: 115 mg/dL — ABNORMAL HIGH (ref 70–99)
Potassium: 4.6 mEq/L (ref 3.5–5.1)
Sodium: 136 mEq/L (ref 135–145)

## 2012-11-02 LAB — CULTURE, BLOOD (ROUTINE X 2)

## 2012-11-02 MED ORDER — HYDROCOD POLST-CHLORPHEN POLST 10-8 MG/5ML PO LQCR
5.0000 mL | Freq: Two times a day (BID) | ORAL | Status: DC | PRN
  Administered 2012-11-02 – 2012-11-04 (×5): 5 mL via ORAL
  Filled 2012-11-02 (×5): qty 5

## 2012-11-02 MED ORDER — DOXERCALCIFEROL 4 MCG/2ML IV SOLN
INTRAVENOUS | Status: AC
  Administered 2012-11-02: 3 ug
  Filled 2012-11-02: qty 2

## 2012-11-02 MED ORDER — DARBEPOETIN ALFA-POLYSORBATE 40 MCG/0.4ML IJ SOLN
INTRAMUSCULAR | Status: AC
  Administered 2012-11-02: 40 ug
  Filled 2012-11-02: qty 0.4

## 2012-11-02 NOTE — Procedures (Signed)
Pt seen on HD.  Ap 190  Vp 210.  BFR 450.  Toleraing HD well.

## 2012-11-02 NOTE — Progress Notes (Signed)
Report called to Patsy Lager, pt transferring from hemodialysis to [id number] via bed will bring belongings to room.

## 2012-11-02 NOTE — Progress Notes (Signed)
BC x 2 on admission faxed to the inpt HD unit both positive for gram neg coccobacilli - on IV levaquin.  Audree Bane, PA-C

## 2012-11-02 NOTE — Progress Notes (Addendum)
[facility] Progress Note [place] TEAM 1 - Stepdown/ICU TEAM   [name] ZOX:[id number] DOB: January 23, 1933 DOA: 10/31/2012 PCP: Irena Cords, MD  Brief narrative: 79 y.o. female with past medical history of diabetes, end-stage renal disease on hemodialysis Tuesday Thursday Saturday, hyperparathyroidism, depression, anemia who presented to the ED with a one to two-day history of productive cough of yellowish sputum, shortness of breath, generalized weakness, and upper airway congestion. Patient denied fever or chills, no nausea, no vomiting, no abdominal pain, no melena, no hematemesis, no hematochezia, no headache, no focal neurological symptoms. Patient denied any lower extremity swelling or pain.   In the ED compressive metabolic profile done had a potassium of 5.8 chloride of 92 BUN of 62 creatinine of 11.99 glucose of 166 and albumin of 2.9. First set of troponin was negative. Pro BNP was 81191. CBC done had a white count of 21.9 hemoglobin of 11.3 and a left shift otherwise was within normal limits. EKG done showed a sinus tachycardia with low voltage. Chest x-ray which was done showed low volume in the chest. Scattered areas of predominantly linear density favored to represent atelectasis. ABG done showed a pH of 7.493, PCO2 of 31, PO2 of 63, bicarbonate of 24.   Assessment/Plan:  Sepsis - due to CAP vs/ C diff coliits Blood pressure remains borderline but clinically patient is improving otherwise  Hypoxic resp failure due to CAP Continue empiric antibiotic therapy - followup chest x-ray reveals basilar atelectasis. - no clear infiltrate consider - suspect acute bronchitis- cough is quite severe with yellow green sputum- cont levaquin for 5 days  C diff colitis in setting of chronic diarrhea since 2008 Continue Flagyl therapy with plan for prolonged treatment course-   Gram + cocci in 1/2 blood cx Coag neg staph which is likely a contaminant therefore, will d/c Zyvox -  second bottle growing gram neg cocco-bacillus  - [place] also a contaminant- will follow - repeat cultures are negative - of note, pt is MRSA screen negative  Hypotension Due to above - d/c norvasc   ESRD on dialysis dialysis Tuesday, Thursdays and Saturdays - Nephrology following  Hyperkalemia Resolved w/ HD as expected   Diabetes mellitus A1c 5.7 - CBG currently reasonably controlled  Anemia anemia of chronic disease secondary to end-stage renal disease  Obesity  Code Status: FULL Family Communication: No family present at time of exam Disposition Plan: transfer to med/surg  Consultants: Nephrology PCCM  Procedures: None  Antibiotics: Linezolid 6/24>>>  Zosyn 6/24 Levaquin 6/24 >> Flagyl po 6/24 >>  DVT prophylaxis: SQ heparin  HPI/Subjective: Patient is awake and interactive. Has a cough with yellow mucous for past 3 days- abd pain with coughing otherwise no significant abdominal symptoms- last episode of diarrhea was yesterday.   Objective: Blood pressure 126/34, pulse 94, temperature 98.7 F (37.1 C), temperature source Oral, resp. rate 19, height 5\' 3"  (1.6 m), weight 94.1 kg (207 lb 7.3 oz), SpO2 100.00%.  Intake/Output Summary (Last 24 hours) at 11/02/12 1447 Last data filed at 11/02/12 1218  Gross per 24 hour  Intake    300 ml  Output   1000 ml  Net   -700 ml    Exam: General: No acute respiratory distress Lungs: Clear to auscultation bilaterally without wheezes or crackles Cardiovascular: Regular rate and rhythm without murmur gallop or rub normal S1 and S2 Abdomen: Obese, nontender, nondistended, soft, bowel sounds positive, no rebound, no ascites, no appreciable mass Extremities: No significant cyanosis, clubbing, or edema bilateral  lower extremities  Data Reviewed: Basic Metabolic Panel:  Recent Labs Lab 10/31/12 1220 10/31/12 1706 11/01/12 0435 11/02/12 0551  NA 135 136 136 136  K 5.8* 5.8* 4.3 4.6  CL 92* 92* 94* 94*  CO2 22 22  31 27   GLUCOSE 166* 206* 164* 115*  BUN 62* 68* 26* 51*  CREATININE 11.99* 13.09* 6.22* 8.39*  CALCIUM 9.1 9.0 8.9 8.7  MG  --  2.2  --   --   PHOS  --   --  6.5*  --    Liver Function Tests:  Recent Labs Lab 10/31/12 1220  AST 14  ALT 8  ALKPHOS 91  BILITOT 0.9  PROT 7.5  ALBUMIN 2.9*   CBC:  Recent Labs Lab 10/31/12 1220 10/31/12 1706 11/01/12 0435 11/02/12 0551  WBC 21.9* 16.4* 17.3* 17.2*  NEUTROABS 19.6*  --  15.0*  --   HGB 11.3* 10.5* 9.2* 8.7*  HCT 35.6* 32.6* 29.3* 28.3*  MCV 77.1* 75.5* 76.7* 77.1*  PLT 225 232 202 233   Cardiac Enzymes:  Recent Labs Lab 10/31/12 1220  TROPONINI <0.30   BNP (last 3 results)  Recent Labs  10/31/12 1220  PROBNP 19230.0*   CBG:  Recent Labs Lab 11/01/12 0828 11/01/12 1223 11/01/12 1610 11/01/12 2125 11/02/12 0722  GLUCAP 179* 96 151* 195* 118*    Recent Results (from the past 240 hour(s))  CULTURE, BLOOD (ROUTINE X 2)     Status: None   Collection Time    10/31/12 12:40 PM      Result Value Range Status   Specimen Description BLOOD ARM LEFT   Final   Special Requests BOTTLES DRAWN AEROBIC ONLY 5CC   Final   Culture  Setup Time 10/31/2012 17:22   Final   Culture     Final   Value: STAPHYLOCOCCUS SPECIES (COAGULASE NEGATIVE)     Note: THE SIGNIFICANCE OF ISOLATING THIS ORGANISM FROM A SINGLE SET OF BLOOD CULTURES WHEN MULTIPLE SETS ARE DRAWN IS UNCERTAIN. PLEASE NOTIFY THE MICROBIOLOGY DEPARTMENT WITHIN ONE WEEK IF SPECIATION AND SENSITIVITIES ARE REQUIRED.     Note: Gram Stain Report Called to,Read Back By and Verified With: DANIELLE BYERLY 11/01/12 1100 BY SMITHERSJ   Report Status 11/02/2012 FINAL   Final  CULTURE, BLOOD (ROUTINE X 2)     Status: None   Collection Time    10/31/12 12:45 PM      Result Value Range Status   Specimen Description BLOOD HAND LEFT   Final   Special Requests BOTTLES DRAWN AEROBIC ONLY 5CC   Final   Culture  Setup Time 10/31/2012 17:22   Final   Culture     Final    Value: GRAM NEGATIVE COCCOBACILLI     Note: REPORT FAXED BY REQUEST     Note: Gram Stain Report Called to,Read Back By and Verified With: [facility] 11/02/12 1115 BY SMITHERSJ   Report Status PENDING   Incomplete  MRSA PCR SCREENING     Status: None   Collection Time    10/31/12  3:33 PM      Result Value Range Status   MRSA by PCR NEGATIVE  NEGATIVE Final   Comment:            The GeneXpert MRSA Assay (FDA     approved for NASAL specimens     only), is one component of a     comprehensive MRSA colonization     surveillance program. It is not  intended to diagnose MRSA     infection nor to guide or     monitor treatment for     MRSA infections.  CLOSTRIDIUM DIFFICILE BY PCR     Status: Abnormal   Collection Time    10/31/12  3:33 PM      Result Value Range Status   C difficile by pcr POSITIVE (*) NEGATIVE Final   Comment: CRITICAL RESULT CALLED TO, READ BACK BY AND VERIFIED WITH:     D. FIELDS RN 17:10 10/31/12 ([email or web address])  CULTURE, BLOOD (ROUTINE X 2)     Status: None   Collection Time    10/31/12  7:05 PM      Result Value Range Status   Specimen Description BLOOD HEMODIALYSIS GRAFT THIGH   Final   Special Requests BOTTLES DRAWN AEROBIC AND ANAEROBIC 10CC   Final   Culture  Setup Time 11/01/2012 04:07   Final   Culture     Final   Value:        BLOOD CULTURE RECEIVED NO GROWTH TO DATE CULTURE WILL BE HELD FOR 5 DAYS BEFORE ISSUING A FINAL NEGATIVE REPORT   Report Status PENDING   Incomplete  CULTURE, BLOOD (ROUTINE X 2)     Status: None   Collection Time    10/31/12  7:20 PM      Result Value Range Status   Specimen Description BLOOD HEMODIALYSIS GRAFT THIGH   Final   Special Requests BOTTLES DRAWN AEROBIC AND ANAEROBIC 10CC   Final   Culture  Setup Time 11/01/2012 04:08   Final   Culture     Final   Value:        BLOOD CULTURE RECEIVED NO GROWTH TO DATE CULTURE WILL BE HELD FOR 5 DAYS BEFORE ISSUING A FINAL NEGATIVE REPORT   Report Status PENDING   Incomplete      Studies:  Recent x-ray studies have been reviewed in detail by the Attending Physician  Scheduled Meds:  Scheduled Meds: . allopurinol  100 mg Oral Daily  . darbepoetin (ARANESP) injection - DIALYSIS  40 mcg Intravenous Q Thu-HD  . doxercalciferol  3 mcg Intravenous Q T,Th,Sa-HD  . feeding supplement (NEPRO CARB STEADY)  237 mL Oral BID BM  . guaiFENesin  1,200 mg Oral BID  . heparin  5,000 Units Subcutaneous Q8H  . insulin aspart  0-9 Units Subcutaneous TID WC  . levalbuterol  0.63 mg Nebulization TID  . levofloxacin (LEVAQUIN) IV  500 mg Intravenous Q48H  . linezolid  600 mg Intravenous Q12H  . loratadine  10 mg Oral Daily  . metroNIDAZOLE  500 mg Oral Q8H  . multivitamin  1 tablet Oral QHS  . sevelamer carbonate  800 mg Oral TID WC  . sodium chloride  3 mL Intravenous Q12H  . triamcinolone cream  1 application Topical BID   Time spent on care of this patient:   Calvert Cantor, MD   [facility] Office  [phone] Pager - Text Page per Loretha Stapler as per below:  On-Call/Text Page:      [email or web address]      password [email or web address]  If 7PM-7AM, please contact night-coverage [email or web address] Password [facility] 11/02/2012, 2:47 PM   LOS: 2 days

## 2012-11-02 NOTE — Progress Notes (Signed)
S: diarrhea a little lee, appetite improving O:BP 103/41  Pulse 83  Temp(Src) 98.3 F (36.8 C) (Oral)  Resp 24  Ht 5\' 3"  (1.6 m)  Wt 93.6 kg (206 lb 5.6 oz)  BMI 36.56 kg/m2  SpO2 100%  Intake/Output Summary (Last 24 hours) at 11/02/12 0735 Last data filed at 11/01/12 0919  Gross per 24 hour  Intake    100 ml  Output      0 ml  Net    100 ml   Weight change: -1.4 kg (-3 lb 1.4 oz) MVH:QIONG and alert CVS:RRR Resp: basilar crackles l>r, bronchial BS lt base Abd:+ BS NTND Ext: no edema  Rt thigh AVG + bruit NEURO:CNI Ox3 no asterixis   . allopurinol  100 mg Oral Daily  . darbepoetin (ARANESP) injection - DIALYSIS  40 mcg Intravenous Q Thu-HD  . doxercalciferol  3 mcg Intravenous Q T,Th,Sa-HD  . feeding supplement (NEPRO CARB STEADY)  237 mL Oral BID BM  . guaiFENesin  1,200 mg Oral BID  . heparin  5,000 Units Subcutaneous Q8H  . insulin aspart  0-9 Units Subcutaneous TID WC  . levalbuterol  0.63 mg Nebulization TID  . levofloxacin (LEVAQUIN) IV  500 mg Intravenous Q48H  . linezolid  600 mg Intravenous Q12H  . loratadine  10 mg Oral Daily  . metroNIDAZOLE  500 mg Oral Q8H  . multivitamin  1 tablet Oral QHS  . sevelamer carbonate  800 mg Oral TID WC  . sodium chloride  3 mL Intravenous Q12H  . triamcinolone cream  1 application Topical BID   Dg Chest Port 1 View  11/02/2012   *RADIOLOGY REPORT*  Clinical Data: Possible pneumonia  PORTABLE CHEST - 1 VIEW  Comparison: Portable chest x-ray of 11/01/2012  Findings: Haziness remains in both lung bases most consistent with atelectasis and possible effusions.  No definite pneumonia is seen although pneumonia at the lung bases cannot be excluded.  Follow-up two-view chest x-ray may be helpful.  Cardiomegaly is stable.  IMPRESSION: Little change in bibasilar opacities most consistent with atelectasis and effusions.  Consider two-view chest x-ray of possible as follow-up.   Original Report Authenticated By: Dwyane Dee, M.D.   Dg  Chest Port 1 View  11/01/2012   *RADIOLOGY REPORT*  Clinical Data: Pneumonia, follow-up  PORTABLE CHEST - 1 VIEW  Comparison: Portable chest x-ray of 10/31/2012  Findings: The lungs are not as well aerated with increase in basilar atelectasis.  Cardiomegaly is present and mild pulmonary vascular congestion cannot be excluded.  IMPRESSION: Diminished aeration with increase in basilar atelectasis.  Cannot exclude pneumonia.  Also mild congestion cannot be excluded.   Original Report Authenticated By: Dwyane Dee, M.D.   Dg Chest Port 1 View  (if Code Sepsis Called)  10/31/2012   *RADIOLOGY REPORT*  Clinical Data: Short of breath for 2 days.  Weakness.  Diabetic patient.  PORTABLE CHEST - 1 VIEW  Comparison: 11/08/2008.  Findings: Low volume chest.  Areas of subsegmental atelectasis are present superimposed on scarring.  Density superior to the right hemidiaphragm is favored to represent subsegmental atelectasis, seen obliquely.  The cardiopericardial silhouette is within normal limits allowing for low volumes.  No definite pleural effusion. No focal areas of consolidation.  IMPRESSION: Low volume chest.  Scattered areas of predominately linear density is favored to represent atelectasis based on lung volumes. Consider repeat PA and lateral chest with full inspiration when patient condition permits.   Original Report Authenticated By: Andreas Newport, M.D.  BMET    Component Value Date/Time   NA 136 11/02/2012 0551   K 4.6 11/02/2012 0551   CL 94* 11/02/2012 0551   CO2 27 11/02/2012 0551   GLUCOSE 115* 11/02/2012 0551   BUN 51* 11/02/2012 0551   CREATININE 8.39* 11/02/2012 0551   CREATININE 4.24* 12/26/2006 1500   CALCIUM 8.7 11/02/2012 0551   CALCIUM 8.9 12/05/2006 1357   GFRNONAA 4* 11/02/2012 0551   GFRAA 5* 11/02/2012 0551   CBC    Component Value Date/Time   WBC 17.2* 11/02/2012 0551   WBC 20.6* 10/10/2006 0835   RBC 3.67* 11/02/2012 0551   RBC 4.17 10/10/2006 0835   HGB 8.7* 11/02/2012 0551   HGB 10.1*  10/10/2006 0835   HCT 28.3* 11/02/2012 0551   HCT 32.0* 10/10/2006 0835   PLT 233 11/02/2012 0551   PLT 605* 10/10/2006 0835   MCV 77.1* 11/02/2012 0551   MCV 76.7* 10/10/2006 0835   MCH 23.7* 11/02/2012 0551   MCH 24.1* 10/10/2006 0835   MCHC 30.7 11/02/2012 0551   MCHC 31.5* 10/10/2006 0835   RDW 15.4 11/02/2012 0551   RDW 17.2* 10/10/2006 0835   LYMPHSABS 0.9 11/01/2012 0435   LYMPHSABS 0.9 10/10/2006 0835   MONOABS 1.3* 11/01/2012 0435   MONOABS 0.7 10/10/2006 0835   EOSABS 0.0 11/01/2012 0435   EOSABS 0.0 10/10/2006 0835   BASOSABS 0.0 11/01/2012 0435   BASOSABS 0.0 10/10/2006 0835     Assessment: 1. C Diff colitis on metronidazole 2. Anemia start aranesp, Hg trending down 3. Sec HPTH on hectorol 4. HTN  5. ESRD  Plan: 1. HD today 2. Could move out of step down to 6700 3. Recheck Hg in AM   Kaulder Zahner T

## 2012-11-03 DIAGNOSIS — D649 Anemia, unspecified: Secondary | ICD-10-CM

## 2012-11-03 DIAGNOSIS — E119 Type 2 diabetes mellitus without complications: Secondary | ICD-10-CM

## 2012-11-03 DIAGNOSIS — D72829 Elevated white blood cell count, unspecified: Secondary | ICD-10-CM

## 2012-11-03 LAB — CBC
MCH: 23.9 pg — ABNORMAL LOW (ref 26.0–34.0)
MCHC: 30.8 g/dL (ref 30.0–36.0)
Platelets: 280 10*3/uL (ref 150–400)

## 2012-11-03 LAB — GLUCOSE, CAPILLARY: Glucose-Capillary: 145 mg/dL — ABNORMAL HIGH (ref 70–99)

## 2012-11-03 MED ORDER — METOPROLOL TARTRATE 1 MG/ML IV SOLN
5.0000 mg | Freq: Once | INTRAVENOUS | Status: AC
  Administered 2012-11-03: 5 mg via INTRAVENOUS
  Filled 2012-11-03: qty 5

## 2012-11-03 MED ORDER — LEVALBUTEROL HCL 0.63 MG/3ML IN NEBU: 0.6300 mg | INHALATION_SOLUTION | Freq: Three times a day (TID) | RESPIRATORY_TRACT | Status: DC | PRN

## 2012-11-03 MED ORDER — LEVALBUTEROL HCL 0.63 MG/3ML IN NEBU
0.6300 mg | INHALATION_SOLUTION | Freq: Two times a day (BID) | RESPIRATORY_TRACT | Status: DC
  Administered 2012-11-03 – 2012-11-08 (×9): 0.63 mg via RESPIRATORY_TRACT
  Filled 2012-11-03 (×16): qty 3

## 2012-11-03 MED ORDER — MORPHINE SULFATE 2 MG/ML IJ SOLN: 1.0000 mg | Freq: Once | INTRAMUSCULAR | Status: DC

## 2012-11-03 MED ORDER — DILTIAZEM LOAD VIA INFUSION
20.0000 mg | Freq: Once | INTRAVENOUS | Status: AC
  Administered 2012-11-04: 20 mg via INTRAVENOUS
  Filled 2012-11-03: qty 20

## 2012-11-03 MED ORDER — DILTIAZEM HCL 100 MG IV SOLR
5.0000 mg/h | INTRAVENOUS | Status: DC
  Administered 2012-11-04: 5 mg/h via INTRAVENOUS
  Filled 2012-11-03: qty 100

## 2012-11-03 NOTE — Progress Notes (Signed)
[facility] Progress Note  Subjective:   Still with productive cough. Not always aware of loose stools but did have one this am. Appetite ok.  Objective Filed Vitals:   11/02/12 2049 11/02/12 2140 11/03/12 0517 11/03/12 0755  BP: 110/33  108/43   Pulse: 85  94   Temp: 98.6 F (37 C)  98.1 F (36.7 C)   TempSrc: Oral  Oral   Resp: 18  18   Height:      Weight: 94.096 kg (207 lb 7.1 oz)     SpO2: 98% 97% 93% 94%   Physical Exam General: Alert, cooperative, NAD Heart: RRR Lungs: Crackles at bases, breathing unlabored Abdomen: Soft, NT, non-distended, Normal BS Extremities: No LE edema Dialysis Access: R thigh AVF + bruit  Dialysis Orders: Center: [facility] TTS - 4 hours Optiflux 180 2K 2.25 Ca 450/A 1.5 heparin 5000 load with 2000 mid tmt; access = right thigh AVGG profile 4 EDW 92 Hecterol 3 Epogen 4200 no IV Fe  Recent labs: Hgb 10.6 6/19 (trending up) and iPTH 325 4/26 418 1/23  Assessment/Plan: 1. Sepsis - due to HCAP vs C diff colitis - Leukocytosis, productive cough and diarrhea. BC pos for gram neg cocci bacillus in 1/2 bottles.  Repeat BC's negative to date. On IV Levaquin, linezolid and PO flagyl per primary 2. ESRD - Continue TTS. K+ 4.6 3. Anemia - Hgb 8.7 On Aranesp 40. Recheck pending 4. Secondary hyperparathyroidism - Continue Hectorol and Renvela  5. HTN/volume - SBPs 100-110s. Home Norvasc on hold for now. Generally tolerates max UF or 2.5-3L as op. Up by ~2kg by bed scales. 6. Nutrition - Albumin 2.9. Renal diet, nepro  Scot Jun. Thad Ranger [facility] Pager [phone] 11/03/2012,8:46 AM  LOS: 3 days  I have seen and examined this patient and agree with plan per Claud Kelp.  Diarrhea a better.  Has not been out of bed, needs to get mobile. Hg trending down, will follow.  Talked with nurse about getting pt up walking.  HD in AM and will recheck labs  . Denzel Etienne T,MD 11/03/2012 10:07 AM  Additional Objective Labs: Basic  Metabolic Panel:  Recent Labs Lab 10/31/12 1706 11/01/12 0435 11/02/12 0551  NA 136 136 136  K 5.8* 4.3 4.6  CL 92* 94* 94*  CO2 22 31 27   GLUCOSE 206* 164* 115*  BUN 68* 26* 51*  CREATININE 13.09* 6.22* 8.39*  CALCIUM 9.0 8.9 8.7  PHOS  --  6.5*  --    Liver Function Tests:  Recent Labs Lab 10/31/12 1220  AST 14  ALT 8  ALKPHOS 91  BILITOT 0.9  PROT 7.5  ALBUMIN 2.9*   No results found for this basename: LIPASE, AMYLASE,  in the last 168 hours CBC:  Recent Labs Lab 10/31/12 1220 10/31/12 1706 11/01/12 0435 11/02/12 0551 11/03/12 0646  WBC 21.9* 16.4* 17.3* 17.2* 19.8*  NEUTROABS 19.6*  --  15.0*  --   --   HGB 11.3* 10.5* 9.2* 8.7* 8.5*  HCT 35.6* 32.6* 29.3* 28.3* 27.6*  MCV 77.1* 75.5* 76.7* 77.1* 77.7*  PLT 225 232 202 233 280   Blood Culture    Component Value Date/Time   SDES BLOOD HEMODIALYSIS GRAFT THIGH 10/31/2012 1920   SPECREQUEST BOTTLES DRAWN AEROBIC AND ANAEROBIC 10CC 10/31/2012 1920   CULT        BLOOD CULTURE RECEIVED NO GROWTH TO DATE CULTURE WILL BE HELD FOR 5 DAYS BEFORE ISSUING A FINAL NEGATIVE REPORT 10/31/2012 1920  REPTSTATUS PENDING 10/31/2012 1920    Cardiac Enzymes:  Recent Labs Lab 10/31/12 1220  TROPONINI <0.30   CBG:  Recent Labs Lab 11/01/12 2125 11/02/12 0722 11/02/12 1413 11/02/12 1712 11/02/12 2034  GLUCAP 195* 118* 103* 225* 136*   Iron Studies: No results found for this basename: IRON, TIBC, TRANSFERRIN, FERRITIN,  in the last 72 hours @lablastinr3 @ Studies/Results: Dg Chest Port 1 View  11/02/2012   *RADIOLOGY REPORT*  Clinical Data: Possible pneumonia  PORTABLE CHEST - 1 VIEW  Comparison: Portable chest x-ray of 11/01/2012  Findings: Haziness remains in both lung bases most consistent with atelectasis and possible effusions.  No definite pneumonia is seen although pneumonia at the lung bases cannot be excluded.  Follow-up two-view chest x-ray may be helpful.  Cardiomegaly is stable.  IMPRESSION: Little  change in bibasilar opacities most consistent with atelectasis and effusions.  Consider two-view chest x-ray of possible as follow-up.   Original Report Authenticated By: Dwyane Dee, M.D.   Medications:   . allopurinol  100 mg Oral Daily  . darbepoetin (ARANESP) injection - DIALYSIS  40 mcg Intravenous Q Thu-HD  . doxercalciferol  3 mcg Intravenous Q T,Th,Sa-HD  . feeding supplement (NEPRO CARB STEADY)  237 mL Oral BID BM  . guaiFENesin  1,200 mg Oral BID  . heparin  5,000 Units Subcutaneous Q8H  . insulin aspart  0-9 Units Subcutaneous TID WC  . levalbuterol  0.63 mg Nebulization TID  . levofloxacin (LEVAQUIN) IV  500 mg Intravenous Q48H  . linezolid  600 mg Intravenous Q12H  . loratadine  10 mg Oral Daily  . metroNIDAZOLE  500 mg Oral Q8H  . multivitamin  1 tablet Oral QHS  . sevelamer carbonate  800 mg Oral TID WC  . sodium chloride  3 mL Intravenous Q12H  . triamcinolone cream  1 application Topical BID

## 2012-11-03 NOTE — Progress Notes (Addendum)
[facility] Progress Note [place] TEAM 1 - Stepdown/ICU TEAM   [name] ZOX:[id number] DOB: [birth year] DOA: 10/31/2012 PCP: Irena Cords, MD  Brief narrative: 79 y.o. female with past medical history of diabetes, end-stage renal disease on hemodialysis Tuesday Thursday Saturday, hyperparathyroidism, depression, anemia who presented to the ED with a one to two-day history of productive cough of yellowish sputum, shortness of breath, generalized weakness, and upper airway congestion. Patient denied fever or chills, no nausea, no vomiting, no abdominal pain, no melena, no hematemesis, no hematochezia, no headache, no focal neurological symptoms. Patient denied any lower extremity swelling or pain.   In the ED compressive metabolic profile done had a potassium of 5.8 chloride of 92 BUN of 62 creatinine of 11.99 glucose of 166 and albumin of 2.9. First set of troponin was negative. Pro BNP was 81191. CBC done had a white count of 21.9 hemoglobin of 11.3 and a left shift otherwise was within normal limits. EKG done showed a sinus tachycardia with low voltage. Chest x-ray which was done showed low volume in the chest. Scattered areas of predominantly linear density favored to represent atelectasis. ABG done showed a pH of 7.493, PCO2 of 31, PO2 of 63, bicarbonate of 24.   Assessment/Plan:  Sepsis - due to CAP vs/ C diff coliits Blood pressure remaining stable today -continue oral flagyl  Hypoxic resp failure due to CAP Continue empiric antibiotic therapy - followup chest x-ray reveals basilar atelectasis. - no clear infiltrate  - suspect acute bronchitis- cough is quite severe with yellow green sputum- cont levaquin for 5 days - continue mucolytics and antitussives  C diff colitis in setting of chronic diarrhea since 2008 Continue Flagyl therapy with plan for prolonged treatment course -WBC trending up, and still some diarrhea, follow -add florastor and follow  Gram + cocci in  1/2 blood cx Coag neg staph which is likely a contaminant therefore, plan was to dc Zyvox on 6/26- will dc today 6/27 and follow - second bottle growing gram neg cocco-bacillus  - may also possibly be acontaminant- ID and sens - repeat cultures are negative - of note, pt is MRSA screen negative  Hypotension Due to above -  norvasc was dc'ed on 6/25  ESRD on dialysis dialysis Tuesday, Thursdays and Saturdays - Nephrology following  Hyperkalemia Resolved w/ HD, follow and recheck  Diabetes mellitus A1c 5.7 - CBG currently reasonably controlled  Anemia anemia of chronic disease secondary to end-stage renal disease  Obesity  Code Status: FULL Family Communication: No family present at time of exam Disposition Plan: transfer to med/surg  Consultants: Nephrology PCCM  Procedures: None  Antibiotics: Linezolid 6/24>>>  Zosyn 6/24 Levaquin 6/24 >> Flagyl po 6/24 >>  DVT prophylaxis: SQ heparin  HPI/Subjective: Patient sitting up in chair, c/o of L. Sided nasal congestion. Per nsg diarrhea so far x 1 early this am  Objective: Blood pressure 121/51, pulse 94, temperature 98.9 F (37.2 C), temperature source Oral, resp. rate 18, height 5\' 3"  (1.6 m), weight 94.096 kg (207 lb 7.1 oz), SpO2 92.00%.  Intake/Output Summary (Last 24 hours) at 11/03/12 1127 Last data filed at 11/03/12 0900  Gross per 24 hour  Intake    360 ml  Output   1000 ml  Net   -640 ml    Exam: General: No acute respiratory distress Lungs: Clear to auscultation bilaterally without wheezes or crackles Cardiovascular: Regular rate and rhythm without murmur gallop or rub normal S1 and S2 Abdomen: Obese, nontender, nondistended,  soft, bowel sounds positive, no rebound, no ascites, no appreciable mass Extremities: No cyanosis, clubbing, or edema bilateral lower extremities  Data Reviewed: Basic Metabolic Panel:  Recent Labs Lab 10/31/12 1220 10/31/12 1706 11/01/12 0435 11/02/12 0551  NA 135  136 136 136  K 5.8* 5.8* 4.3 4.6  CL 92* 92* 94* 94*  CO2 22 22 31 27   GLUCOSE 166* 206* 164* 115*  BUN 62* 68* 26* 51*  CREATININE 11.99* 13.09* 6.22* 8.39*  CALCIUM 9.1 9.0 8.9 8.7  MG  --  2.2  --   --   PHOS  --   --  6.5*  --    Liver Function Tests:  Recent Labs Lab 10/31/12 1220  AST 14  ALT 8  ALKPHOS 91  BILITOT 0.9  PROT 7.5  ALBUMIN 2.9*   CBC:  Recent Labs Lab 10/31/12 1220 10/31/12 1706 11/01/12 0435 11/02/12 0551 11/03/12 0646  WBC 21.9* 16.4* 17.3* 17.2* 19.8*  NEUTROABS 19.6*  --  15.0*  --   --   HGB 11.3* 10.5* 9.2* 8.7* 8.5*  HCT 35.6* 32.6* 29.3* 28.3* 27.6*  MCV 77.1* 75.5* 76.7* 77.1* 77.7*  PLT 225 232 202 233 280   Cardiac Enzymes:  Recent Labs Lab 10/31/12 1220  TROPONINI <0.30   BNP (last 3 results)  Recent Labs  10/31/12 1220  PROBNP 19230.0*   CBG:  Recent Labs Lab 11/02/12 1413 11/02/12 1712 11/02/12 2034 11/03/12 0930 11/03/12 1108  GLUCAP 103* 225* 136* 147* 147*    Recent Results (from the past 240 hour(s))  CULTURE, BLOOD (ROUTINE X 2)     Status: None   Collection Time    10/31/12 12:40 PM      Result Value Range Status   Specimen Description BLOOD ARM LEFT   Final   Special Requests BOTTLES DRAWN AEROBIC ONLY 5CC   Final   Culture  Setup Time 10/31/2012 17:22   Final   Culture     Final   Value: STAPHYLOCOCCUS SPECIES (COAGULASE NEGATIVE)     Note: THE SIGNIFICANCE OF ISOLATING THIS ORGANISM FROM A SINGLE SET OF BLOOD CULTURES WHEN MULTIPLE SETS ARE DRAWN IS UNCERTAIN. PLEASE NOTIFY THE MICROBIOLOGY DEPARTMENT WITHIN ONE WEEK IF SPECIATION AND SENSITIVITIES ARE REQUIRED.     Note: Gram Stain Report Called to,Read Back By and Verified With: DANIELLE BYERLY 11/01/12 1100 BY SMITHERSJ   Report Status 11/02/2012 FINAL   Final  CULTURE, BLOOD (ROUTINE X 2)     Status: None   Collection Time    10/31/12 12:45 PM      Result Value Range Status   Specimen Description BLOOD HAND LEFT   Final   Special Requests  BOTTLES DRAWN AEROBIC ONLY 5CC   Final   Culture  Setup Time 10/31/2012 17:22   Final   Culture     Final   Value: GRAM NEGATIVE COCCOBACILLI     Note: REPORT FAXED BY REQUEST     Note: Gram Stain Report Called to,Read Back By and Verified With: [facility] 11/02/12 1115 BY SMITHERSJ   Report Status PENDING   Incomplete  MRSA PCR SCREENING     Status: None   Collection Time    10/31/12  3:33 PM      Result Value Range Status   MRSA by PCR NEGATIVE  NEGATIVE Final   Comment:            The GeneXpert MRSA Assay (FDA     approved for NASAL specimens  only), is one component of a     comprehensive MRSA colonization     surveillance program. It is not     intended to diagnose MRSA     infection nor to guide or     monitor treatment for     MRSA infections.  CLOSTRIDIUM DIFFICILE BY PCR     Status: Abnormal   Collection Time    10/31/12  3:33 PM      Result Value Range Status   C difficile by pcr POSITIVE (*) NEGATIVE Final   Comment: CRITICAL RESULT CALLED TO, READ BACK BY AND VERIFIED WITH:     D. FIELDS RN 17:10 10/31/12 ([email or web address])  CULTURE, BLOOD (ROUTINE X 2)     Status: None   Collection Time    10/31/12  7:05 PM      Result Value Range Status   Specimen Description BLOOD HEMODIALYSIS GRAFT THIGH   Final   Special Requests BOTTLES DRAWN AEROBIC AND ANAEROBIC 10CC   Final   Culture  Setup Time 11/01/2012 04:07   Final   Culture     Final   Value:        BLOOD CULTURE RECEIVED NO GROWTH TO DATE CULTURE WILL BE HELD FOR 5 DAYS BEFORE ISSUING A FINAL NEGATIVE REPORT   Report Status PENDING   Incomplete  CULTURE, BLOOD (ROUTINE X 2)     Status: None   Collection Time    10/31/12  7:20 PM      Result Value Range Status   Specimen Description BLOOD HEMODIALYSIS GRAFT THIGH   Final   Special Requests BOTTLES DRAWN AEROBIC AND ANAEROBIC 10CC   Final   Culture  Setup Time 11/01/2012 04:08   Final   Culture     Final   Value:        BLOOD CULTURE RECEIVED NO GROWTH TO DATE  CULTURE WILL BE HELD FOR 5 DAYS BEFORE ISSUING A FINAL NEGATIVE REPORT   Report Status PENDING   Incomplete     Studies:  I  have reviewed Recent x-ray studies in detail.  Scheduled Meds:  Scheduled Meds: . allopurinol  100 mg Oral Daily  . darbepoetin (ARANESP) injection - DIALYSIS  40 mcg Intravenous Q Thu-HD  . doxercalciferol  3 mcg Intravenous Q T,Th,Sa-HD  . feeding supplement (NEPRO CARB STEADY)  237 mL Oral BID BM  . guaiFENesin  1,200 mg Oral BID  . heparin  5,000 Units Subcutaneous Q8H  . insulin aspart  0-9 Units Subcutaneous TID WC  . levalbuterol  0.63 mg Nebulization TID  . levofloxacin (LEVAQUIN) IV  500 mg Intravenous Q48H  . loratadine  10 mg Oral Daily  . metroNIDAZOLE  500 mg Oral Q8H  . multivitamin  1 tablet Oral QHS  . sevelamer carbonate  800 mg Oral TID WC  . sodium chloride  3 mL Intravenous Q12H  . triamcinolone cream  1 application Topical BID   Time spent on care of this patient:   Kela Millin, MD  [phone] [facility] Office  [phone] Pager - Text Page per Amion as per below:  On-Call/Text Page:      [email or web address]      password [email or web address]  If 7PM-7AM, please contact night-coverage [email or web address] Password [facility] 11/03/2012, 11:27 AM   LOS: 3 days

## 2012-11-03 NOTE — Progress Notes (Signed)
Pt Heart rate sustaining in the 150's to 160's. Page sent M. Lynch for notification. Awaiting call back. Dondra Spry

## 2012-11-03 NOTE — Progress Notes (Signed)
Subjective/Objective 79 year old female admitted with sepsis and clostridium difficile toxin. HR 160-170 with dyspnea and left posterior chest/back pain.   Physical Examination: General appearance - in mild to moderate distress and ill-appearing Chest - bronchial sounds, diminished in bilateral bases Heart - normal rate, regular rhythm, normal S1, S2, no murmurs, rubs, clicks or gallops, irregularly irregular rhythm - EKG atrial fibrillation Extremities - cool, pedal pulses +1.   Scheduled Meds: . allopurinol  100 mg Oral Daily  . darbepoetin (ARANESP) injection - DIALYSIS  40 mcg Intravenous Q Thu-HD  . diltiazem  20 mg Intravenous Once  . doxercalciferol  3 mcg Intravenous Q T,Th,Sa-HD  . feeding supplement (NEPRO CARB STEADY)  237 mL Oral BID BM  . guaiFENesin  1,200 mg Oral BID  . heparin  5,000 Units Subcutaneous Q8H  . insulin aspart  0-9 Units Subcutaneous TID WC  . levalbuterol  0.63 mg Nebulization BID  . levofloxacin (LEVAQUIN) IV  500 mg Intravenous Q48H  . loratadine  10 mg Oral Daily  . [START ON 11/04/2012] metoprolol  5 mg Intravenous Once  . metroNIDAZOLE  500 mg Oral Q8H  . [START ON 11/04/2012]  morphine injection  1 mg Intravenous Once  . multivitamin  1 tablet Oral QHS  . sevelamer carbonate  800 mg Oral TID WC  . sodium chloride  3 mL Intravenous Q12H  . triamcinolone cream  1 application Topical BID   Continuous Infusions: . diltiazem (CARDIZEM) infusion     PRN Meds:sodium chloride, acetaminophen, acetaminophen, calcium carbonate (dosed in mg elemental calcium), camphor-menthol, chlorpheniramine-HYDROcodone, feeding supplement (NEPRO CARB STEADY), hydrOXYzine, levalbuterol, ondansetron (ZOFRAN) IV, ondansetron, sodium chloride  Vital signs in last 24 hours: Temp:  [98 F (36.7 C)-98.9 F (37.2 C)] 98.3 F (36.8 C) (06/27 2133) Pulse Rate:  [93-100] 100 (06/27 2133) Resp:  [16-18] 18 (06/27 2133) BP: (108-127)/(43-55) 112/55 mmHg (06/27 2133) SpO2:  [92  %-100 %] 100 % (06/27 2133)  Intake/Output last 3 shifts: I/O last 3 completed shifts: In: 1077 [P.O.:840; Other:237] Out: 1000 [Other:1000] Intake/Output this shift:    Problem Assessment/Plan  Atrial fibrillation: transfer to cardiac telemetry unit. Cardizem bolus and then start Cardizem drip. Troponin ordered every 6 hours x 3. Placed on oxygen 2LNC

## 2012-11-03 NOTE — Progress Notes (Signed)
Pt Hr still sustaining in the 160's-180s for the past 15 minutes. Complaints of left flank pain. Text paged on call MD M. Lynch again. She advised that [facility] be alerted and ordered new medication. [facility] has been called. EKG being preformed by NT. Cardizem drip orderd. Transfer orders put into place. Elray Mcgregor to bedside. New orders for metoprolol and admin. Bed ready. Spoke with Press photographer for unit [place], [place] to transfer. Pt. Report given to Lennox Laity, RN at transfer. Dondra Spry

## 2012-11-04 LAB — TROPONIN I
Troponin I: <0.3 ng/mL (ref <0.30)
Troponin I: <0.3 ng/mL (ref <0.30)

## 2012-11-04 LAB — CBC
Hemoglobin: 8.5 g/dL — ABNORMAL LOW (ref 12.0–15.0)
MCH: 23.9 pg — ABNORMAL LOW (ref 26.0–34.0)
RBC: 3.55 MIL/uL — ABNORMAL LOW (ref 3.87–5.11)

## 2012-11-04 LAB — GLUCOSE, CAPILLARY

## 2012-11-04 LAB — BASIC METABOLIC PANEL
CO2: 26 mEq/L (ref 19–32)
Calcium: 9.2 mg/dL (ref 8.4–10.5)
Glucose, Bld: 135 mg/dL — ABNORMAL HIGH (ref 70–99)
Sodium: 134 mEq/L — ABNORMAL LOW (ref 135–145)

## 2012-11-04 MED ORDER — SACCHAROMYCES BOULARDII 250 MG PO CAPS
250.0000 mg | ORAL_CAPSULE | Freq: Two times a day (BID) | ORAL | Status: DC
  Administered 2012-11-04 – 2012-11-10 (×12): 250 mg via ORAL
  Filled 2012-11-04 (×14): qty 1

## 2012-11-04 MED ORDER — DOXERCALCIFEROL 4 MCG/2ML IV SOLN
INTRAVENOUS | Status: AC
Start: 2012-11-04 — End: 2012-11-04
  Filled 2012-11-04: qty 2

## 2012-11-04 MED ORDER — DILTIAZEM HCL 30 MG PO TABS
30.0000 mg | ORAL_TABLET | Freq: Four times a day (QID) | ORAL | Status: AC
  Administered 2012-11-04 – 2012-11-06 (×11): 30 mg via ORAL
  Filled 2012-11-04 (×12): qty 1

## 2012-11-04 MED ORDER — ACETAMINOPHEN 325 MG PO TABS
ORAL_TABLET | ORAL | Status: AC
  Administered 2012-11-04: 650 mg via ORAL
  Filled 2012-11-04: qty 2

## 2012-11-04 NOTE — Progress Notes (Addendum)
[facility] Progress Note    [name] HYQ:[id number] DOB: Oct 15, 1932 DOA: 10/31/2012 PCP: Irena Cords, MD  Brief narrative: 79 y.o. female with past medical history of diabetes, end-stage renal disease on hemodialysis Tuesday Thursday Saturday, hyperparathyroidism, depression, anemia who presented to the ED with a one to two-day history of productive cough of yellowish sputum, shortness of breath, generalized weakness, and upper airway congestion. Patient denied fever or chills, no nausea, no vomiting, no abdominal pain, no melena, no hematemesis, no hematochezia, no headache, no focal neurological symptoms. Patient denied any lower extremity swelling or pain.   In the ED compressive metabolic profile done had a potassium of 5.8 chloride of 92 BUN of 62 creatinine of 11.99 glucose of 166 and albumin of 2.9. First set of troponin was negative. Pro BNP was 95284. CBC done had a white count of 21.9 hemoglobin of 11.3 and a left shift otherwise was within normal limits. EKG done showed a sinus tachycardia with low voltage. Chest x-ray which was done showed low volume in the chest. Scattered areas of predominantly linear density favored to represent atelectasis. ABG done showed a pH of 7.493, PCO2 of 31, PO2 of 63, bicarbonate of 24.  Pt was initially admitted to the step down unit and transferred to floor on 6/27, and overnight(6/27-28) pt went into afib with RVR and was started on cardizem drip and transferred to 2000unit      Assessment/Plan: Atrial Fibrillation, transient -In pt with resolving sepsis, Troponins neg -She spontaneously converted on the cardizem drip to NSR and remaining in sinus this am -will obtain echo, transition to oral cardizem -discussed pt with Dr Mayford Knife regarding ?anticoagulation and she states that for afib in setting of sepsis, anticoagulation only recommended if Afib recurs. She recommends for pt to follow with her oupt for event monitor following  discharge  Sepsis - due to CAP vs/ C diff coliits Blood pressure remaining stable today -1/2 blood culture id'ed as haemophilus -continue levaquin, oral flagyl and wbc continues to increase, follow consult  ID if does not begin to trend down   Hypoxic resp failure due to CAP Continue empiric antibiotic therapy - followup chest x-ray reveals basilar atelectasis. - no clear infiltrate  - suspect acute bronchitis- cough is quite severe with yellow green sputum- cont levaquin for 5 days - continue mucolytics and antitussives  C diff colitis in setting of chronic diarrhea since 2008 Continue Flagyl therapy with plan for prolonged treatment course -WBC still  trending up as above, diarrhea improving, follow consult GI vs ID as above if not continuing to decrease -continue florastor and follow  Gram + cocci in 1/2 blood cx Coag neg staph which is likely a contaminant therefore, plan was to dc Zyvox on 6/26- was dc'ed on 6/27 and follow- of note, pt is MRSA screen negative - second bottle growing gram neg cocco-bacillus >> Haemophilus infuenza-  Sensitivity pending -continue Levaquin for now, follow and consult id if wbc continues to rincrease  Hypotension Due to above -  norvasc was dc'ed on 6/25  ESRD on dialysis dialysis Tuesday, Thursdays and Saturdays - Nephrology following  Hyperkalemia Resolved w/ HD, follow and recheck  Diabetes mellitus A1c 5.7 - CBG currently reasonably controlled  Anemia anemia of chronic disease secondary to end-stage renal disease  Obesity  Code Status: FULL Family Communication: No family present at time of exam Disposition Plan:to  Consultants: Nephrology PCCM  Procedures: None  Antibiotics: Linezolid 6/24>>>  Zosyn 6/24 Levaquin 6/24 >> Flagyl po  6/24 >>  DVT prophylaxis: SQ heparin  HPI/Subjective: Events of last pm noted, pt tachy to 160-170 and transferred to 2000unit. She denies CP,no SOB, per nsg decreased diarrhea and stool  more foremed Objective: Blood pressure 98/60, pulse 88, temperature 99.1 F (37.3 C), temperature source Oral, resp. rate 17, height 5\' 3"  (1.6 m), weight 94.1 kg (207 lb 7.3 oz), SpO2 97.00%.  Intake/Output Summary (Last 24 hours) at 11/04/12 0933 Last data filed at 11/03/12 1700  Gross per 24 hour  Intake    717 ml  Output      0 ml  Net    717 ml    Exam: General: No acute respiratory distress Lungs: Clear to auscultation bilaterally without wheezes or crackles Cardiovascular: Regular rate and rhythm without murmur gallop or rub normal S1 and S2 Abdomen: Obese, nontender, nondistended, soft, bowel sounds positive, no rebound, no ascites, no appreciable mass Extremities: No cyanosis, clubbing, or edema bilateral lower extremities  Data Reviewed: Basic Metabolic Panel:  Recent Labs Lab 10/31/12 1220 10/31/12 1706 11/01/12 0435 11/02/12 0551 11/04/12 0520  NA 135 136 136 136 134*  K 5.8* 5.8* 4.3 4.6 4.2  CL 92* 92* 94* 94* 92*  CO2 22 22 31 27 26   GLUCOSE 166* 206* 164* 115* 135*  BUN 62* 68* 26* 51* 52*  CREATININE 11.99* 13.09* 6.22* 8.39* 6.97*  CALCIUM 9.1 9.0 8.9 8.7 9.2  MG  --  2.2  --   --   --   PHOS  --   --  6.5*  --   --    Liver Function Tests:  Recent Labs Lab 10/31/12 1220  AST 14  ALT 8  ALKPHOS 91  BILITOT 0.9  PROT 7.5  ALBUMIN 2.9*   CBC:  Recent Labs Lab 10/31/12 1220 10/31/12 1706 11/01/12 0435 11/02/12 0551 11/03/12 0646 11/04/12 0520  WBC 21.9* 16.4* 17.3* 17.2* 19.8* 21.8*  NEUTROABS 19.6*  --  15.0*  --   --   --   HGB 11.3* 10.5* 9.2* 8.7* 8.5* 8.5*  HCT 35.6* 32.6* 29.3* 28.3* 27.6* 27.3*  MCV 77.1* 75.5* 76.7* 77.1* 77.7* 76.9*  PLT 225 232 202 233 280 281   Cardiac Enzymes:  Recent Labs Lab 10/31/12 1220 11/03/12 2341 11/04/12 0520  TROPONINI <0.30 <0.30 <0.30   BNP (last 3 results)  Recent Labs  10/31/12 1220  PROBNP 19230.0*   CBG:  Recent Labs Lab 11/03/12 0930 11/03/12 1108 11/03/12 1626  11/03/12 2100 11/04/12 0645  GLUCAP 147* 147* 163* 145* 131*    Recent Results (from the past 240 hour(s))  CULTURE, BLOOD (ROUTINE X 2)     Status: None   Collection Time    10/31/12 12:40 PM      Result Value Range Status   Specimen Description BLOOD ARM LEFT   Final   Special Requests BOTTLES DRAWN AEROBIC ONLY 5CC   Final   Culture  Setup Time 10/31/2012 17:22   Final   Culture     Final   Value: STAPHYLOCOCCUS SPECIES (COAGULASE NEGATIVE)     Note: THE SIGNIFICANCE OF ISOLATING THIS ORGANISM FROM A SINGLE SET OF BLOOD CULTURES WHEN MULTIPLE SETS ARE DRAWN IS UNCERTAIN. PLEASE NOTIFY THE MICROBIOLOGY DEPARTMENT WITHIN ONE WEEK IF SPECIATION AND SENSITIVITIES ARE REQUIRED.     Note: Gram Stain Report Called to,Read Back By and Verified With: DANIELLE BYERLY 11/01/12 1100 BY SMITHERSJ   Report Status 11/02/2012 FINAL   Final  CULTURE, BLOOD (ROUTINE X 2)  Status: None   Collection Time    10/31/12 12:45 PM      Result Value Range Status   Specimen Description BLOOD HAND LEFT   Final   Special Requests BOTTLES DRAWN AEROBIC ONLY 5CC   Final   Culture  Setup Time 10/31/2012 17:22   Final   Culture     Final   Value: HAEMOPHILUS INFLUENZAE     Note: REPORT FAXED BY REQUEST FAXED TO [facility] [place] [facility]ERS [id number] BY CATAR     Note: Gram Stain Report Called to,Read Back By and Verified With: [facility] 11/02/12 1115 BY SMITHERSJ   Report Status PENDING   Incomplete  MRSA PCR SCREENING     Status: None   Collection Time    10/31/12  3:33 PM      Result Value Range Status   MRSA by PCR NEGATIVE  NEGATIVE Final   Comment:            The GeneXpert MRSA Assay (FDA     approved for NASAL specimens     only), is one component of a     comprehensive MRSA colonization     surveillance program. It is not     intended to diagnose MRSA     infection nor to guide or     monitor treatment for     MRSA infections.  CLOSTRIDIUM DIFFICILE BY PCR     Status: Abnormal   Collection  Time    10/31/12  3:33 PM      Result Value Range Status   C difficile by pcr POSITIVE (*) NEGATIVE Final   Comment: CRITICAL RESULT CALLED TO, READ BACK BY AND VERIFIED WITH:     D. FIELDS RN 17:10 10/31/12 ([email or web address])  CULTURE, BLOOD (ROUTINE X 2)     Status: None   Collection Time    10/31/12  7:05 PM      Result Value Range Status   Specimen Description BLOOD HEMODIALYSIS GRAFT THIGH   Final   Special Requests BOTTLES DRAWN AEROBIC AND ANAEROBIC 10CC   Final   Culture  Setup Time 11/01/2012 04:07   Final   Culture     Final   Value:        BLOOD CULTURE RECEIVED NO GROWTH TO DATE CULTURE WILL BE HELD FOR 5 DAYS BEFORE ISSUING A FINAL NEGATIVE REPORT   Report Status PENDING   Incomplete  CULTURE, BLOOD (ROUTINE X 2)     Status: None   Collection Time    10/31/12  7:20 PM      Result Value Range Status   Specimen Description BLOOD HEMODIALYSIS GRAFT THIGH   Final   Special Requests BOTTLES DRAWN AEROBIC AND ANAEROBIC 10CC   Final   Culture  Setup Time 11/01/2012 04:08   Final   Culture     Final   Value:        BLOOD CULTURE RECEIVED NO GROWTH TO DATE CULTURE WILL BE HELD FOR 5 DAYS BEFORE ISSUING A FINAL NEGATIVE REPORT   Report Status PENDING   Incomplete     Studies:  I  have reviewed Recent x-ray studies in detail.  Scheduled Meds:  Scheduled Meds: . allopurinol  100 mg Oral Daily  . darbepoetin (ARANESP) injection - DIALYSIS  40 mcg Intravenous Q Thu-HD  . diltiazem  30 mg Oral Q6H  . doxercalciferol  3 mcg Intravenous Q T,Th,Sa-HD  . feeding supplement (NEPRO CARB STEADY)  237 mL Oral BID BM  .  guaiFENesin  1,200 mg Oral BID  . heparin  5,000 Units Subcutaneous Q8H  . insulin aspart  0-9 Units Subcutaneous TID WC  . levalbuterol  0.63 mg Nebulization BID  . levofloxacin (LEVAQUIN) IV  500 mg Intravenous Q48H  . loratadine  10 mg Oral Daily  . metroNIDAZOLE  500 mg Oral Q8H  .  morphine injection  1 mg Intravenous Once  . multivitamin  1 tablet Oral QHS  .  saccharomyces boulardii  250 mg Oral BID  . sevelamer carbonate  800 mg Oral TID WC  . sodium chloride  3 mL Intravenous Q12H  . triamcinolone cream  1 application Topical BID   Time spent on care of this patient:   Kela Millin, MD  [phone] [facility] Office  [phone] Pager - Text Page per Amion as per below:  On-Call/Text Page:      [email or web address]      password [email or web address]  If 7PM-7AM, please contact night-coverage [email or web address] Password [facility] 11/04/2012, 9:33 AM   LOS: 4 days

## 2012-11-04 NOTE — Progress Notes (Signed)
  Echocardiogram 2D Echocardiogram has been performed.  [name] 11/04/2012, 12:23 PM

## 2012-11-04 NOTE — Progress Notes (Signed)
S: diarrhea better.  Still with prod cough but she says it is better O:BP 98/60  Pulse 88  Temp(Src) 99.1 F (37.3 C) (Oral)  Resp 17  Ht 5\' 3"  (1.6 m)  Wt 94.1 kg (207 lb 7.3 oz)  BMI 36.76 kg/m2  SpO2 97%  Intake/Output Summary (Last 24 hours) at 11/04/12 0851 Last data filed at 11/03/12 1700  Gross per 24 hour  Intake    957 ml  Output      0 ml  Net    957 ml   Weight change: -1.1 kg (-2 lb 6.8 oz) ONG:EXBMW and alert CVS:RRR Resp: basilar crackles l>r, bronchial BS lt base Abd:+ BS NTND Ext: no edema  Rt thigh AVG + bruit NEURO:CNI Ox3 no asterixis   . allopurinol  100 mg Oral Daily  . darbepoetin (ARANESP) injection - DIALYSIS  40 mcg Intravenous Q Thu-HD  . doxercalciferol  3 mcg Intravenous Q T,Th,Sa-HD  . feeding supplement (NEPRO CARB STEADY)  237 mL Oral BID BM  . guaiFENesin  1,200 mg Oral BID  . heparin  5,000 Units Subcutaneous Q8H  . insulin aspart  0-9 Units Subcutaneous TID WC  . levalbuterol  0.63 mg Nebulization BID  . levofloxacin (LEVAQUIN) IV  500 mg Intravenous Q48H  . loratadine  10 mg Oral Daily  . metroNIDAZOLE  500 mg Oral Q8H  .  morphine injection  1 mg Intravenous Once  . multivitamin  1 tablet Oral QHS  . saccharomyces boulardii  250 mg Oral BID  . sevelamer carbonate  800 mg Oral TID WC  . sodium chloride  3 mL Intravenous Q12H  . triamcinolone cream  1 application Topical BID   No results found. BMET    Component Value Date/Time   NA 134* 11/04/2012 0520   K 4.2 11/04/2012 0520   CL 92* 11/04/2012 0520   CO2 26 11/04/2012 0520   GLUCOSE 135* 11/04/2012 0520   BUN 52* 11/04/2012 0520   CREATININE 6.97* 11/04/2012 0520   CREATININE 4.24* 12/26/2006 1500   CALCIUM 9.2 11/04/2012 0520   CALCIUM 8.9 12/05/2006 1357   GFRNONAA 5* 11/04/2012 0520   GFRAA 6* 11/04/2012 0520   CBC    Component Value Date/Time   WBC 21.8* 11/04/2012 0520   WBC 20.6* 10/10/2006 0835   RBC 3.55* 11/04/2012 0520   RBC 4.17 10/10/2006 0835   HGB 8.5* 11/04/2012  0520   HGB 10.1* 10/10/2006 0835   HCT 27.3* 11/04/2012 0520   HCT 32.0* 10/10/2006 0835   PLT 281 11/04/2012 0520   PLT 605* 10/10/2006 0835   MCV 76.9* 11/04/2012 0520   MCV 76.7* 10/10/2006 0835   MCH 23.9* 11/04/2012 0520   MCH 24.1* 10/10/2006 0835   MCHC 31.1 11/04/2012 0520   MCHC 31.5* 10/10/2006 0835   RDW 15.5 11/04/2012 0520   RDW 17.2* 10/10/2006 0835   LYMPHSABS 0.9 11/01/2012 0435   LYMPHSABS 0.9 10/10/2006 0835   MONOABS 1.3* 11/01/2012 0435   MONOABS 0.7 10/10/2006 0835   EOSABS 0.0 11/01/2012 0435   EOSABS 0.0 10/10/2006 0835   BASOSABS 0.0 11/01/2012 0435   BASOSABS 0.0 10/10/2006 0835     Assessment: 1. C Diff colitis on metronidazole 2. Anemia start aranesp, Hg trending down 3. Sec HPTH on hectorol 4. HTN  5. ESRD 6 ? BC results 7. SVT A fib per note but no EKG from yest in results) now in NSR  Plan: 1. HD today 2. Await repeat BC done 6/24 at  HD 3. Cont antibiotics  Orlean Holtrop T

## 2012-11-05 LAB — BASIC METABOLIC PANEL
BUN: 33 mg/dL — ABNORMAL HIGH (ref 6–23)
Chloride: 95 mEq/L — ABNORMAL LOW (ref 96–112)
GFR calc Af Amer: 10 mL/min — ABNORMAL LOW (ref >90)
Potassium: 4.5 mEq/L (ref 3.5–5.1)

## 2012-11-05 LAB — GLUCOSE, CAPILLARY: Glucose-Capillary: 104 mg/dL — ABNORMAL HIGH (ref 70–99)

## 2012-11-05 LAB — CBC
HCT: 29.4 % — ABNORMAL LOW (ref 36.0–46.0)
Hemoglobin: 9.1 g/dL — ABNORMAL LOW (ref 12.0–15.0)
RDW: 15.6 % — ABNORMAL HIGH (ref 11.5–15.5)
WBC: 18.3 10*3/uL — ABNORMAL HIGH (ref 4.0–10.5)

## 2012-11-05 MED ORDER — AMOXICILLIN-POT CLAVULANATE 500-125 MG PO TABS
1.0000 | ORAL_TABLET | Freq: Two times a day (BID) | ORAL | Status: DC
  Administered 2012-11-05 – 2012-11-06 (×2): 500 mg via ORAL
  Filled 2012-11-05 (×4): qty 1

## 2012-11-05 NOTE — Progress Notes (Signed)
Telemetry sent message stating that pt had 6 beat run of v-tach.  Strip was examined and was determined to just be an area where her HR had sped up just a little.  No V-tach was seen.

## 2012-11-05 NOTE — Progress Notes (Signed)
S: diarrhea improved but still present   O:BP 111/65  Pulse 89  Temp(Src) 98.6 F (37 C) (Oral)  Resp 18  Ht 5\' 3"  (1.6 m)  Wt 92 kg (202 lb 13.2 oz)  BMI 35.94 kg/m2  SpO2 94%  Intake/Output Summary (Last 24 hours) at 11/05/12 0714 Last data filed at 11/04/12 1728  Gross per 24 hour  Intake      0 ml  Output   2000 ml  Net  -2000 ml   Weight change: -0.3 kg (-10.6 oz) AOZ:HYQMV and alert CVS:RRR Resp: basilar crackles l>r, bronchial BS lt base Abd:+ BS NTND Ext: no edema  Rt thigh AVG + bruit NEURO:CNI Ox3 no asterixis   . allopurinol  100 mg Oral Daily  . darbepoetin (ARANESP) injection - DIALYSIS  40 mcg Intravenous Q Thu-HD  . diltiazem  30 mg Oral Q6H  . doxercalciferol  3 mcg Intravenous Q T,Th,Sa-HD  . feeding supplement (NEPRO CARB STEADY)  237 mL Oral BID BM  . guaiFENesin  1,200 mg Oral BID  . heparin  5,000 Units Subcutaneous Q8H  . insulin aspart  0-9 Units Subcutaneous TID WC  . levalbuterol  0.63 mg Nebulization BID  . levofloxacin (LEVAQUIN) IV  500 mg Intravenous Q48H  . loratadine  10 mg Oral Daily  . metroNIDAZOLE  500 mg Oral Q8H  .  morphine injection  1 mg Intravenous Once  . multivitamin  1 tablet Oral QHS  . saccharomyces boulardii  250 mg Oral BID  . sevelamer carbonate  800 mg Oral TID WC  . sodium chloride  3 mL Intravenous Q12H  . triamcinolone cream  1 application Topical BID   No results found. BMET    Component Value Date/Time   NA 137 11/05/2012 0505   K 4.5 11/05/2012 0505   CL 95* 11/05/2012 0505   CO2 30 11/05/2012 0505   GLUCOSE 108* 11/05/2012 0505   BUN 33* 11/05/2012 0505   CREATININE 4.41* 11/05/2012 0505   CREATININE 4.24* 12/26/2006 1500   CALCIUM 9.1 11/05/2012 0505   CALCIUM 8.9 12/05/2006 1357   GFRNONAA 9* 11/05/2012 0505   GFRAA 10* 11/05/2012 0505   CBC    Component Value Date/Time   WBC 18.3* 11/05/2012 0505   WBC 20.6* 10/10/2006 0835   RBC 3.77* 11/05/2012 0505   RBC 4.17 10/10/2006 0835   HGB 9.1* 11/05/2012 0505   HGB 10.1* 10/10/2006 0835   HCT 29.4* 11/05/2012 0505   HCT 32.0* 10/10/2006 0835   PLT 280 11/05/2012 0505   PLT 605* 10/10/2006 0835   MCV 78.0 11/05/2012 0505   MCV 76.7* 10/10/2006 0835   MCH 24.1* 11/05/2012 0505   MCH 24.1* 10/10/2006 0835   MCHC 31.0 11/05/2012 0505   MCHC 31.5* 10/10/2006 0835   RDW 15.6* 11/05/2012 0505   RDW 17.2* 10/10/2006 0835   LYMPHSABS 0.9 11/01/2012 0435   LYMPHSABS 0.9 10/10/2006 0835   MONOABS 1.3* 11/01/2012 0435   MONOABS 0.7 10/10/2006 0835   EOSABS 0.0 11/01/2012 0435   EOSABS 0.0 10/10/2006 0835   BASOSABS 0.0 11/01/2012 0435   BASOSABS 0.0 10/10/2006 0835     Assessment: 1. C Diff colitis on metronidazole 2. Anemia start aranesp, Hg trending down 3. Sec HPTH on hectorol 4. HTN  5. ESRD 6 ? BC results 7. SVT A fib, now in NSR  Plan: 1. Increase ambulation 2.  HD Tues  Marquavis Hannen T

## 2012-11-05 NOTE — Progress Notes (Addendum)
[facility] Progress Note    [name] ZOX:[id number] DOB: 1932/06/05 DOA: 10/31/2012 PCP: Irena Cords, MD  Brief narrative: 79 y.o. female with past medical history of diabetes, end-stage renal disease on hemodialysis Tuesday Thursday Saturday, hyperparathyroidism, depression, anemia who presented to the ED with a one to two-day history of productive cough of yellowish sputum, shortness of breath, generalized weakness, and upper airway congestion. Patient denied fever or chills, no nausea, no vomiting, no abdominal pain, no melena, no hematemesis, no hematochezia, no headache, no focal neurological symptoms. Patient denied any lower extremity swelling or pain.   In the ED compressive metabolic profile done had a potassium of 5.8 chloride of 92 BUN of 62 creatinine of 11.99 glucose of 166 and albumin of 2.9. First set of troponin was negative. Pro BNP was 81191. CBC done had a white count of 21.9 hemoglobin of 11.3 and a left shift otherwise was within normal limits. EKG done showed a sinus tachycardia with low voltage. Chest x-ray which was done showed low volume in the chest. Scattered areas of predominantly linear density favored to represent atelectasis. ABG done showed a pH of 7.493, PCO2 of 31, PO2 of 63, bicarbonate of 24.  Pt was initially admitted to the step down unit and transferred to floor on 6/27, and overnight(6/27-28) pt went into afib with RVR and was started on cardizem drip and transferred to 2000unit      Assessment/Plan: Atrial Fibrillation, transient -In pt with resolving sepsis, Troponins neg -She spontaneously converted on the cardizem drip to NSR and remaining in sinus this am -will obtain echo, transition to oral cardizem -discussed pt with Dr Mayford Knife 6/27 regarding ?anticoagulation and she states that for afib in setting of sepsis, anticoagulation only recommended if Afib recurs. She recommends for pt to follow with her oupt for event monitor following  discharge  Sepsis, Haemophilus - due to CAP vs/ C diff coliits Blood pressure remaining stable today -1/2 blood culture id'ed as haemophilus -Dicussed pt with ID/ Dr Luciana Axe and he recommends changing levaquin to Augmentin for H.flu/PNA ,and to continue oral flagyl, wbc beginning to trend down today, follow- she remains afebrile and hemodynamically stable   Hypoxic resp failure due to CAP Continue empiric antibiotic therapy - followup chest x-ray reveals basilar atelectasis. - no clear infiltrate  - suspect acute bronchitis- cough improved yellow green sputum- will changeabx to Augmentin as per ID recs - continue mucolytics and antitussives  C diff colitis in setting of chronic diarrhea since 2008 Continue Flagyl therapy with plan for prolonged treatment course -WBC beginning to trend down, diarrhea improving,-continue florastor and follow  Gram + cocci in 1/2 blood cx Coag neg staph which is likely a contaminant therefore, plan was to dc Zyvox on 6/26- was dc'ed on 6/27 and follow- of note, pt is MRSA screen negative - second bottle growing gram neg cocco-bacillus >> Haemophilus infuenza-  Sensitivity pending -as above d/c(6/28) Levaquin, change to Augmentin as discussed above and follow,   Hypotension Due to above -  norvasc was dc'ed on 6/25  ESRD on dialysis dialysis Tuesday, Thursdays and Saturdays - Nephrology following  Hyperkalemia Resolved w/ HD, follow and recheck  Diabetes mellitus A1c 5.7 - CBG currently reasonably controlled  Anemia anemia of chronic disease secondary to end-stage renal disease  Obesity  Code Status: FULL Family Communication: No family present at bedside Disposition Plan:to  Consultants: Nephrology PCCM  Procedures: None  Antibiotics: Linezolid 6/24>>>  Zosyn 6/24 Levaquin 6/25 >>6/28 Flagyl po 6/24 >>  Augmentin 6/28>> DVT prophylaxis: SQ heparin  HPI/Subjective: Pt denies any new c/o, wants to get out of bed with  assistance Objective: Blood pressure 111/65, pulse 89, temperature 98.6 F (37 C), temperature source Oral, resp. rate 18, height 5\' 3"  (1.6 m), weight 92 kg (202 lb 13.2 oz), SpO2 94.00%.  Intake/Output Summary (Last 24 hours) at 11/05/12 1007 Last data filed at 11/04/12 1728  Gross per 24 hour  Intake      0 ml  Output   2000 ml  Net  -2000 ml    Exam: General: No acute respiratory distress Lungs: Clear to auscultation bilaterally without wheezes or crackles Cardiovascular: Regular rate and rhythm without murmur gallop or rub normal S1 and S2 Abdomen: Obese, nontender, nondistended, soft, bowel sounds positive, no rebound, no ascites, no appreciable mass Extremities: No cyanosis, clubbing, or edema   Data Reviewed: Basic Metabolic Panel:  Recent Labs Lab 10/31/12 1706 11/01/12 0435 11/02/12 0551 11/04/12 0520 11/05/12 0505  NA 136 136 136 134* 137  K 5.8* 4.3 4.6 4.2 4.5  CL 92* 94* 94* 92* 95*  CO2 22 31 27 26 30   GLUCOSE 206* 164* 115* 135* 108*  BUN 68* 26* 51* 52* 33*  CREATININE 13.09* 6.22* 8.39* 6.97* 4.41*  CALCIUM 9.0 8.9 8.7 9.2 9.1  MG 2.2  --   --   --   --   PHOS  --  6.5*  --   --   --    Liver Function Tests:  Recent Labs Lab 10/31/12 1220  AST 14  ALT 8  ALKPHOS 91  BILITOT 0.9  PROT 7.5  ALBUMIN 2.9*   CBC:  Recent Labs Lab 10/31/12 1220  11/01/12 0435 11/02/12 0551 11/03/12 0646 11/04/12 0520 11/05/12 0505  WBC 21.9*  < > 17.3* 17.2* 19.8* 21.8* 18.3*  NEUTROABS 19.6*  --  15.0*  --   --   --   --   HGB 11.3*  < > 9.2* 8.7* 8.5* 8.5* 9.1*  HCT 35.6*  < > 29.3* 28.3* 27.6* 27.3* 29.4*  MCV 77.1*  < > 76.7* 77.1* 77.7* 76.9* 78.0  PLT 225  < > 202 233 280 281 280  < > = values in this interval not displayed. Cardiac Enzymes:  Recent Labs Lab 10/31/12 1220 11/03/12 2341 11/04/12 0520 11/04/12 1128  TROPONINI <0.30 <0.30 <0.30 <0.30   BNP (last 3 results)  Recent Labs  10/31/12 1220  PROBNP 19230.0*    CBG:  Recent Labs Lab 11/03/12 2100 11/04/12 0645 11/04/12 1108 11/04/12 2120 11/05/12 0626  GLUCAP 145* 131* 109* 232* 104*    Recent Results (from the past 240 hour(s))  CULTURE, BLOOD (ROUTINE X 2)     Status: None   Collection Time    10/31/12 12:40 PM      Result Value Range Status   Specimen Description BLOOD ARM LEFT   Final   Special Requests BOTTLES DRAWN AEROBIC ONLY 5CC   Final   Culture  Setup Time 10/31/2012 17:22   Final   Culture     Final   Value: STAPHYLOCOCCUS SPECIES (COAGULASE NEGATIVE)     Note: THE SIGNIFICANCE OF ISOLATING THIS ORGANISM FROM A SINGLE SET OF BLOOD CULTURES WHEN MULTIPLE SETS ARE DRAWN IS UNCERTAIN. PLEASE NOTIFY THE MICROBIOLOGY DEPARTMENT WITHIN ONE WEEK IF SPECIATION AND SENSITIVITIES ARE REQUIRED.     Note: Gram Stain Report Called to,Read Back By and Verified With: DANIELLE BYERLY 11/01/12 1100 BY SMITHERSJ   Report Status 11/02/2012  FINAL   Final  CULTURE, BLOOD (ROUTINE X 2)     Status: None   Collection Time    10/31/12 12:45 PM      Result Value Range Status   Specimen Description BLOOD HAND LEFT   Final   Special Requests BOTTLES DRAWN AEROBIC ONLY 5CC   Final   Culture  Setup Time 10/31/2012 17:22   Final   Culture     Final   Value: HAEMOPHILUS INFLUENZAE     Note: REPORT FAXED BY REQUEST FAXED TO [facility] F[facility] ROGERS [id number] BY CATAR     Note: Gram Stain Report Called to,Read Back By and Verified With: [facility] 11/02/12 1115 BY SMITHERSJ   Report Status PENDING   Incomplete  MRSA PCR SCREENING     Status: None   Collection Time    10/31/12  3:33 PM      Result Value Range Status   MRSA by PCR NEGATIVE  NEGATIVE Final   Comment:            The GeneXpert MRSA Assay (FDA     approved for NASAL specimens     only), is one component of a     comprehensive MRSA colonization     surveillance program. It is not     intended to diagnose MRSA     infection nor to guide or     monitor treatment for     MRSA  infections.  CLOSTRIDIUM DIFFICILE BY PCR     Status: Abnormal   Collection Time    10/31/12  3:33 PM      Result Value Range Status   C difficile by pcr POSITIVE (*) NEGATIVE Final   Comment: CRITICAL RESULT CALLED TO, READ BACK BY AND VERIFIED WITH:     D. FIELDS RN 17:10 10/31/12 ([email or web address])  CULTURE, BLOOD (ROUTINE X 2)     Status: None   Collection Time    10/31/12  7:05 PM      Result Value Range Status   Specimen Description BLOOD HEMODIALYSIS GRAFT THIGH   Final   Special Requests BOTTLES DRAWN AEROBIC AND ANAEROBIC 10CC   Final   Culture  Setup Time 11/01/2012 04:07   Final   Culture     Final   Value:        BLOOD CULTURE RECEIVED NO GROWTH TO DATE CULTURE WILL BE HELD FOR 5 DAYS BEFORE ISSUING A FINAL NEGATIVE REPORT   Report Status PENDING   Incomplete  CULTURE, BLOOD (ROUTINE X 2)     Status: None   Collection Time    10/31/12  7:20 PM      Result Value Range Status   Specimen Description BLOOD HEMODIALYSIS GRAFT THIGH   Final   Special Requests BOTTLES DRAWN AEROBIC AND ANAEROBIC 10CC   Final   Culture  Setup Time 11/01/2012 04:08   Final   Culture     Final   Value:        BLOOD CULTURE RECEIVED NO GROWTH TO DATE CULTURE WILL BE HELD FOR 5 DAYS BEFORE ISSUING A FINAL NEGATIVE REPORT   Report Status PENDING   Incomplete     Studies:  I  have reviewed Recent x-ray studies in detail.  Scheduled Meds:  Scheduled Meds: . allopurinol  100 mg Oral Daily  . darbepoetin (ARANESP) injection - DIALYSIS  40 mcg Intravenous Q Thu-HD  . diltiazem  30 mg Oral Q6H  . doxercalciferol  3 mcg Intravenous Q T,Th,Sa-HD  .  feeding supplement (NEPRO CARB STEADY)  237 mL Oral BID BM  . guaiFENesin  1,200 mg Oral BID  . heparin  5,000 Units Subcutaneous Q8H  . insulin aspart  0-9 Units Subcutaneous TID WC  . levalbuterol  0.63 mg Nebulization BID  . levofloxacin (LEVAQUIN) IV  500 mg Intravenous Q48H  . loratadine  10 mg Oral Daily  . metroNIDAZOLE  500 mg Oral Q8H  .  morphine  injection  1 mg Intravenous Once  . multivitamin  1 tablet Oral QHS  . saccharomyces boulardii  250 mg Oral BID  . sevelamer carbonate  800 mg Oral TID WC  . sodium chloride  3 mL Intravenous Q12H  . triamcinolone cream  1 application Topical BID   Time spent on care of this patient:   Kela Millin, MD  [phone] [facility] Office  [phone] Pager - Text Page per Amion as per below:  On-Call/Text Page:      [email or web address]      password [email or web address]  If 7PM-7AM, please contact night-coverage [email or web address] Password [email or web address] 11/05/2012, 10:07 AM   LOS: 5 days

## 2012-11-05 NOTE — Progress Notes (Signed)
PHARMACY - ADJUSTING ANTIBIOTICS FOR RENAL FUNCTION  Assessment: 79 y/o female on Levaquin for PNA, sepsis. Patient has ESRD so Levaquin does not need to be adjusted. Of note, patient was ordered Levaquin 750 mg IV x1 on 6/24 but the dose is not charted as given so unsure if patient received.   Antibiotics: Linezolid 6/24 >> 6/27 Levaquin 6/24 >> (6/24 dose not charted as given, SZP complete) Flagyl PO 6/24 for cdiff >>  Cultures: 6/24 BCx x 4 >> H. flu, CNS - likely contaminant 6/24 Cdiff PCR (+)  Plan: -To ensure patient gets a full 5 days of therapy, consider stopping Levaquin on 7/1 instead. -Pharmacy signing off  [facility], [place].D., BCPS Clinical Pharmacist Pager: [phone] 11/05/2012 11:08 AM

## 2012-11-05 NOTE — Evaluation (Signed)
Physical Therapy Evaluation Patient Details Name: [name] MRN: [id number] DOB: Aug 29, 1932 Today's Date: 11/05/2012 Time: 0928-1006 PT Time Calculation (min): 38 min  PT Assessment / Plan / Recommendation History of Present Illness  79 y.o. female with past medical history of diabetes, end-stage renal disease on hemodialysis Tuesday Thursday Saturday, hyperparathyroidism, depression, anemia who presented to the ED with a one to two-day history of productive cough of yellowish sputum, shortness of breath, generalized weakness, and upper airway congestion..  Pt + for Cdiff.  Clinical Impression  Pt limited due to generalized weakness and needs further acute PT services to improve overall mobility to prepare for safe d/c home with nephew.      PT Assessment  Patient needs continued PT services    Follow Up Recommendations  Home health PT;Supervision/Assistance - 24 hour    Equipment Recommendations  None recommended by PT    Recommendations for Other Services OT consult   Frequency Min 3X/week    Precautions / Restrictions Precautions Precautions: Fall Restrictions Weight Bearing Restrictions: No   Pertinent Vitals/Pain No c/o pain      Mobility  Bed Mobility Bed Mobility: Supine to Sit Supine to Sit: 4: Min guard;With rails Details for Bed Mobility Assistance: Minguard for safety with cues for hand placement and heavy use of rail Transfers Transfers: Sit to Stand;Stand to Sit Sit to Stand: 4: Min assist;From chair/3-in-1 Stand to Sit: 4: Min assist;To bed Details for Transfer Assistance: (A) to initiate transfer with cues for hand placement Ambulation/Gait Ambulation/Gait Assistance: 4: Min assist Ambulation Distance (Feet): 10 Feet Assistive device: Rolling walker Ambulation/Gait Assistance Details: (A) to manage RW and maintain balance with cues for RW placement.  Limited due to generalized weakness Gait Pattern: Step-through pattern;Decreased stride  length;Shuffle Gait velocity: decreased Stairs: No Wheelchair Mobility Wheelchair Mobility: No     PT Diagnosis: Difficulty walking;Generalized weakness  PT Problem List: Decreased strength;Decreased activity tolerance;Decreased balance;Decreased mobility;Decreased knowledge of use of DME;Cardiopulmonary status limiting activity;Obesity PT Treatment Interventions: DME instruction;Gait training;Stair training;Functional mobility training;Therapeutic activities;Therapeutic exercise;Patient/family education     PT Goals(Current goals can be found in the care plan section) Acute Rehab PT Goals Patient Stated Goal: To return home PT Goal Formulation: With patient Time For Goal Achievement: 11/12/12 Potential to Achieve Goals: Good  Visit Information  Last PT Received On: 11/05/12 Assistance Needed: +1 History of Present Illness: 79 y.o. female with past medical history of diabetes, end-stage renal disease on hemodialysis Tuesday Thursday Saturday, hyperparathyroidism, depression, anemia who presented to the ED with a one to two-day history of productive cough of yellowish sputum, shortness of breath, generalized weakness, and upper airway congestion..  Pt + for Cdiff.       Prior Functioning  Home Living Family/patient expects to be discharged to:: Private residence (lives with nephew) Living Arrangements: Children;Other (Comment) (nephew)-calls nephew her son Available Help at Discharge: Family;Available 24 hours/day Type of Home: House Home Access: Stairs to enter [employer] of Steps: 3 Entrance Stairs-Rails: Right;Left Home Layout: One level Home Equipment: Walker - 2 wheels;Bedside commode;Wheelchair - manual;Cane - single point Additional Comments: pt takes sponge bath Prior Function Level of Independence: Independent with assistive device(s) (occasional use of cane) Communication Communication: No difficulties Dominant Hand: Right    Cognition   Cognition Arousal/Alertness: Awake/alert Behavior During Therapy: WFL for tasks assessed/performed Overall Cognitive Status: Within Functional Limits for tasks assessed    Extremity/Trunk Assessment Lower Extremity Assessment Lower Extremity Assessment: Generalized weakness   Balance Balance Balance Assessed: Yes Static  Sitting Balance Static Sitting - Balance Support: Feet supported;Bilateral upper extremity supported Static Sitting - Level of Assistance: 5: Stand by assistance Static Standing Balance Static Standing - Balance Support: Bilateral upper extremity supported Static Standing - Level of Assistance: 5: Stand by assistance Static Standing - Comment/# of Minutes: ~ 3 minutes to complete pericare with total (a)  End of Session PT - End of Session Equipment Utilized During Treatment: Gait belt;Oxygen (2L) Activity Tolerance: Patient tolerated treatment well Patient left: in chair;with call bell/phone within reach Nurse Communication: Mobility status  GP     Dorrance Sellick 11/05/2012, 10:21 AM Jake Shark, PT DPT [phone]

## 2012-11-06 DIAGNOSIS — N186 End stage renal disease: Secondary | ICD-10-CM | POA: Diagnosis not present

## 2012-11-06 LAB — BASIC METABOLIC PANEL
BUN: 59 mg/dL — ABNORMAL HIGH (ref 6–23)
Chloride: 94 mEq/L — ABNORMAL LOW (ref 96–112)
Creatinine, Ser: 6.55 mg/dL — ABNORMAL HIGH (ref 0.50–1.10)
GFR calc non Af Amer: 5 mL/min — ABNORMAL LOW (ref >90)
Glucose, Bld: 135 mg/dL — ABNORMAL HIGH (ref 70–99)
Potassium: 3.7 mEq/L (ref 3.5–5.1)

## 2012-11-06 LAB — CBC
HCT: 28.2 % — ABNORMAL LOW (ref 36.0–46.0)
Hemoglobin: 8.9 g/dL — ABNORMAL LOW (ref 12.0–15.0)
MCHC: 31.6 g/dL (ref 30.0–36.0)
MCV: 76.4 fL — ABNORMAL LOW (ref 78.0–100.0)

## 2012-11-06 LAB — GLUCOSE, CAPILLARY
Glucose-Capillary: 171 mg/dL — ABNORMAL HIGH (ref 70–99)
Glucose-Capillary: 180 mg/dL — ABNORMAL HIGH (ref 70–99)

## 2012-11-06 MED ORDER — DILTIAZEM HCL ER COATED BEADS 120 MG PO CP24
120.0000 mg | ORAL_CAPSULE | Freq: Every day | ORAL | Status: DC
  Administered 2012-11-08 – 2012-11-10 (×3): 120 mg via ORAL
  Filled 2012-11-06 (×4): qty 1

## 2012-11-06 MED ORDER — AMOXICILLIN-POT CLAVULANATE 500-125 MG PO TABS
1.0000 | ORAL_TABLET | Freq: Every day | ORAL | Status: DC
  Administered 2012-11-07: 500 mg via ORAL
  Filled 2012-11-06 (×2): qty 1

## 2012-11-06 NOTE — Progress Notes (Addendum)
[facility] Progress Note    [name] ZOX:[id number] DOB: 1932/05/16 DOA: 10/31/2012 PCP: Irena Cords, MD  Brief narrative: 79 y.o. female with past medical history of diabetes, end-stage renal disease on hemodialysis Tuesday Thursday Saturday, hyperparathyroidism, depression, anemia who presented to the ED with a one to two-day history of productive cough of yellowish sputum, shortness of breath, generalized weakness, and upper airway congestion. Patient denied fever or chills, no nausea, no vomiting, no abdominal pain, no melena, no hematemesis, no hematochezia, no headache, no focal neurological symptoms. Patient denied any lower extremity swelling or pain.   In the ED compressive metabolic profile done had a potassium of 5.8 chloride of 92 BUN of 62 creatinine of 11.99 glucose of 166 and albumin of 2.9. First set of troponin was negative. Pro BNP was 81191. CBC done had a white count of 21.9 hemoglobin of 11.3 and a left shift otherwise was within normal limits. EKG done showed a sinus tachycardia with low voltage. Chest x-ray which was done showed low volume in the chest. Scattered areas of predominantly linear density favored to represent atelectasis. ABG done showed a pH of 7.493, PCO2 of 31, PO2 of 63, bicarbonate of 24.  Pt was initially admitted to the step down unit and transferred to floor on 6/27, and overnight(6/27-28) pt went into afib with RVR and was started on cardizem drip and transferred to 2000unit      Assessment/Plan: Atrial Fibrillation, transient -as above, In pt with resolving sepsis, Troponins neg  -She spontaneously converted on the cardizem drip to NSR and remaining in sinus in am of 6/28 - echo was obtained and showed an EF of 55-60, she was transitioned to oral cardizem -discussed pt with Dr Mayford Knife 6/27 regarding ?anticoagulation and she stated that for afib in setting of sepsis, anticoagulation only recommended if Afib recurs. She recommends for  pt to follow with her oupt for event monitor following discharge -So far A. fib has not recurred, follow  Sepsis, Haemophilus - due to CAP vs/ C diff coliits Blood pressure remaining stable today -1/2 blood culture id'ed as haemophilus -Dicussed pt with ID/ Dr Luciana Axe and he recommends changing levaquin to Augmentin for H.flu/PNA ,and to continue oral flagyl -WBC fluctuating-stable elevated, but remains afebrile and hemodynamically, follow.  Hypoxic resp failure due to CAP Continue empiric antibiotic therapy - followup chest x-ray reveals basilar atelectasis. - no clear infiltrate  - suspect acute bronchitis- cough improved yellow green sputum- was changed to Augmentin as per ID recs - continue mucolytics and antitussives  C diff colitis in setting of chronic diarrhea since 2008 Continue Flagyl therapy with plan for prolonged treatment course -WBC beginning to trend down, diarrhea improving,-continue florastor and follow  Gram + cocci in 1/2 blood cx Coag neg staph which is likely a contaminant therefore, plan was to dc Zyvox on 6/26- was dc'ed on 6/27 and follow- of note, pt is MRSA screen negative - second bottle growing gram neg cocco-bacillus >> Haemophilus infuenza-  Sensitivity pending -as above d/c(6/28) Levaquin, continue Augmentin as discussed above and follow,   Hypotension Due to above -  norvasc was dc'ed on 6/25 -Resolved  ESRD on dialysis dialysis Tuesday, Thursdays and Saturdays - Nephrology following  Hyperkalemia Resolved w/ HD, follow and recheck  Diabetes mellitus A1c 5.7 - CBG currently reasonably controlled  Anemia anemia of chronic disease secondary to end-stage renal disease  Obesity  Code Status: FULL Family Communication: No family present at bedside Disposition Plan:to  Consultants: Nephrology PCCM  Procedures: ECHO Study Conclusions  - Left ventricle: The cavity size was normal. Systolic function was normal. The estimated ejection fraction  was in the range of 55% to 60%. Cannot exclude moderate hypokinesis of the entire myocardium. - Mitral valve: Calcified annulus. Mild to moderate regurgitation. - Left atrium: The atrium was mildly dilated. - Right ventricle: The cavity size was mildly dilated. Wall thickness was normal. - Pulmonary arteries: PA peak pressure: 36mm Hg (S).     Antibiotics: Linezolid 6/24>>>  Zosyn 6/24 Levaquin 6/25 >>6/28 Flagyl po 6/24 >> Augmentin 6/28>> DVT prophylaxis: SQ heparin  HPI/Subjective: Pt still with cough, denies shortness of breath. States to [place] more formed-diarrhea less. Objective: Blood pressure 110/65, pulse 87, temperature 98.1 F (36.7 C), temperature source Oral, resp. rate 20, height 5\' 3"  (1.6 m), weight 92 kg (202 lb 13.2 oz), SpO2 97.00%. No intake or output data in the 24 hours ending 11/06/12 0837  Exam: General: No acute respiratory distress Lungs: Clear to auscultation bilaterally without wheezes or crackles Cardiovascular: Regular rate and rhythm without murmur gallop or rub normal S1 and S2 Abdomen: Obese, nontender, nondistended, soft, bowel sounds positive, no rebound, no ascites, no appreciable mass Extremities: No cyanosis, clubbing, or edema   Data Reviewed: Basic Metabolic Panel:  Recent Labs Lab 10/31/12 1706 11/01/12 0435 11/02/12 0551 11/04/12 0520 11/05/12 0505 11/06/12 0415  NA 136 136 136 134* 137 136  K 5.8* 4.3 4.6 4.2 4.5 3.7  CL 92* 94* 94* 92* 95* 94*  CO2 22 31 27 26 30 28   GLUCOSE 206* 164* 115* 135* 108* 135*  BUN 68* 26* 51* 52* 33* 59*  CREATININE 13.09* 6.22* 8.39* 6.97* 4.41* 6.55*  CALCIUM 9.0 8.9 8.7 9.2 9.1 9.4  MG 2.2  --   --   --   --   --   PHOS  --  6.5*  --   --   --   --    Liver Function Tests:  Recent Labs Lab 10/31/12 1220  AST 14  ALT 8  ALKPHOS 91  BILITOT 0.9  PROT 7.5  ALBUMIN 2.9*   CBC:  Recent Labs Lab 10/31/12 1220  11/01/12 0435 11/02/12 0551 11/03/12 0646 11/04/12 0520  11/05/12 0505 11/06/12 0415  WBC 21.9*  < > 17.3* 17.2* 19.8* 21.8* 18.3* 19.3*  NEUTROABS 19.6*  --  15.0*  --   --   --   --   --   HGB 11.3*  < > 9.2* 8.7* 8.5* 8.5* 9.1* 8.9*  HCT 35.6*  < > 29.3* 28.3* 27.6* 27.3* 29.4* 28.2*  MCV 77.1*  < > 76.7* 77.1* 77.7* 76.9* 78.0 76.4*  PLT 225  < > 202 233 280 281 280 342  < > = values in this interval not displayed. Cardiac Enzymes:  Recent Labs Lab 10/31/12 1220 11/03/12 2341 11/04/12 0520 11/04/12 1128  TROPONINI <0.30 <0.30 <0.30 <0.30   BNP (last 3 results)  Recent Labs  10/31/12 1220  PROBNP 19230.0*   CBG:  Recent Labs Lab 11/05/12 0626 11/05/12 1106 11/05/12 1606 11/05/12 2340 11/06/12 0608  GLUCAP 104* 180* 154* 122* 135*    Recent Results (from the past 240 hour(s))  CULTURE, BLOOD (ROUTINE X 2)     Status: None   Collection Time    10/31/12 12:40 PM      Result Value Range Status   Specimen Description BLOOD ARM LEFT   Final   Special Requests BOTTLES DRAWN AEROBIC ONLY 5CC   Final  Culture  Setup Time 10/31/2012 17:22   Final   Culture     Final   Value: STAPHYLOCOCCUS SPECIES (COAGULASE NEGATIVE)     Note: THE SIGNIFICANCE OF ISOLATING THIS ORGANISM FROM A SINGLE SET OF BLOOD CULTURES WHEN MULTIPLE SETS ARE DRAWN IS UNCERTAIN. PLEASE NOTIFY THE MICROBIOLOGY DEPARTMENT WITHIN ONE WEEK IF SPECIATION AND SENSITIVITIES ARE REQUIRED.     Note: Gram Stain Report Called to,Read Back By and Verified With: DANIELLE BYERLY 11/01/12 1100 BY SMITHERSJ   Report Status 11/02/2012 FINAL   Final  CULTURE, BLOOD (ROUTINE X 2)     Status: None   Collection Time    10/31/12 12:45 PM      Result Value Range Status   Specimen Description BLOOD HAND LEFT   Final   Special Requests BOTTLES DRAWN AEROBIC ONLY 5CC   Final   Culture  Setup Time 10/31/2012 17:22   Final   Culture     Final   Value: HAEMOPHILUS INFLUENZAE     Note: REPORT FAXED BY REQUEST FAXED TO [facility] Milayna ROGERS [id number] BY CATAR     Note: Gram  Stain Report Called to,Read Back By and Verified With: [facility] 11/02/12 1115 BY SMITHERSJ   Report Status PENDING   Incomplete  MRSA PCR SCREENING     Status: None   Collection Time    10/31/12  3:33 PM      Result Value Range Status   MRSA by PCR NEGATIVE  NEGATIVE Final   Comment:            The GeneXpert MRSA Assay (FDA     approved for NASAL specimens     only), is one component of a     comprehensive MRSA colonization     surveillance program. It is not     intended to diagnose MRSA     infection nor to guide or     monitor treatment for     MRSA infections.  CLOSTRIDIUM DIFFICILE BY PCR     Status: Abnormal   Collection Time    10/31/12  3:33 PM      Result Value Range Status   C difficile by pcr POSITIVE (*) NEGATIVE Final   Comment: CRITICAL RESULT CALLED TO, READ BACK BY AND VERIFIED WITH:     D. FIELDS RN 17:10 10/31/12 ([email or web address])  CULTURE, BLOOD (ROUTINE X 2)     Status: None   Collection Time    10/31/12  7:05 PM      Result Value Range Status   Specimen Description BLOOD HEMODIALYSIS GRAFT THIGH   Final   Special Requests BOTTLES DRAWN AEROBIC AND ANAEROBIC 10CC   Final   Culture  Setup Time 11/01/2012 04:07   Final   Culture     Final   Value:        BLOOD CULTURE RECEIVED NO GROWTH TO DATE CULTURE WILL BE HELD FOR 5 DAYS BEFORE ISSUING A FINAL NEGATIVE REPORT   Report Status PENDING   Incomplete  CULTURE, BLOOD (ROUTINE X 2)     Status: None   Collection Time    10/31/12  7:20 PM      Result Value Range Status   Specimen Description BLOOD HEMODIALYSIS GRAFT THIGH   Final   Special Requests BOTTLES DRAWN AEROBIC AND ANAEROBIC 10CC   Final   Culture  Setup Time 11/01/2012 04:08   Final   Culture     Final   Value:  BLOOD CULTURE RECEIVED NO GROWTH TO DATE CULTURE WILL BE HELD FOR 5 DAYS BEFORE ISSUING A FINAL NEGATIVE REPORT   Report Status PENDING   Incomplete     Studies:  I  have reviewed Recent x-ray studies in detail.  Scheduled  Meds:  Scheduled Meds: . allopurinol  100 mg Oral Daily  . amoxicillin-clavulanate  1 tablet Oral BID WC  . darbepoetin (ARANESP) injection - DIALYSIS  40 mcg Intravenous Q Thu-HD  . diltiazem  30 mg Oral Q6H  . doxercalciferol  3 mcg Intravenous Q T,Th,Sa-HD  . feeding supplement (NEPRO CARB STEADY)  237 mL Oral BID BM  . guaiFENesin  1,200 mg Oral BID  . heparin  5,000 Units Subcutaneous Q8H  . insulin aspart  0-9 Units Subcutaneous TID WC  . levalbuterol  0.63 mg Nebulization BID  . loratadine  10 mg Oral Daily  . metroNIDAZOLE  500 mg Oral Q8H  .  morphine injection  1 mg Intravenous Once  . multivitamin  1 tablet Oral QHS  . saccharomyces boulardii  250 mg Oral BID  . sevelamer carbonate  800 mg Oral TID WC  . sodium chloride  3 mL Intravenous Q12H  . triamcinolone cream  1 application Topical BID   Time spent on care of this patient:   Kela Millin, MD  [phone] [facility] Office  [phone] Pager - Text Page per Amion as per below:  On-Call/Text Page:      [email or web address]      password [email or web address]  If 7PM-7AM, please contact night-coverage [email or web address] Password [facility] 11/06/2012, 8:37 AM   LOS: 6 days

## 2012-11-06 NOTE — Evaluation (Signed)
Occupational Therapy Evaluation Patient Details Name: [name] MRN: [id number] DOB: May 03, 1933 Today's Date: 11/06/2012 Time: 4098-1191 OT Time Calculation (min): 33 min  OT Assessment / Plan / Recommendation History of present illness 79 y.o. female with past medical history of diabetes, end-stage renal disease on hemodialysis Tuesday Thursday Saturday, hyperparathyroidism, depression, anemia who presented to the ED with a one to two-day history of productive cough of yellowish sputum, shortness of breath, generalized weakness, and upper airway congestion..  Pt + for Cdiff.   Clinical Impression   Pt is overall min assist for selfcare tasks and toileting.  Pt will benefit from acute care OT to help increase overall endurance.  Feel she will need SNF rehab secondary to pt not having 24 hour supervision.    OT Assessment  Patient needs continued OT Services    Follow Up Recommendations  SNF;Supervision/Assistance - 24 hour       Equipment Recommendations  Tub/shower bench       Frequency  Min 2X/week    Precautions / Restrictions Precautions Precautions: Fall Restrictions Weight Bearing Restrictions: No   Pertinent Vitals/Pain No report of pain, O2 sats 93% on 3Ls    ADL  Eating/Feeding: Simulated;Independent Where Assessed - Eating/Feeding: Chair Grooming: Performed;Minimal assistance Where Assessed - Grooming: Supported standing Upper Body Bathing: Simulated;Set up Where Assessed - Upper Body Bathing: Unsupported sitting Lower Body Bathing: Simulated;Minimal assistance Where Assessed - Lower Body Bathing: Supported sit to stand Upper Body Dressing: Simulated;Set up Where Assessed - Upper Body Dressing: Unsupported sitting Lower Body Dressing: Simulated;Minimal assistance Where Assessed - Lower Body Dressing: Supported sit to stand Toilet Transfer: Performed Statistician Method: Surveyor, minerals: Chemical engineer and Hygiene: Performed;Moderate assistance Where Assessed - Toileting Clothing Manipulation and Hygiene: Sit to stand from 3-in-1 or toilet Tub/Shower Transfer Method: Not assessed Equipment Used: Rolling walker Transfers/Ambulation Related to ADLs: Pt performs functional mobility at a min assist level with increased time.  Demonstrates decreased ability to take adequate steps and demonstrates wider base of support.    OT Diagnosis: Generalized weakness  OT Problem List: Decreased strength;Decreased activity tolerance;Impaired balance (sitting and/or standing);Decreased knowledge of use of DME or AE OT Treatment Interventions: Self-care/ADL training;DME and/or AE instruction;Therapeutic activities;Balance training;Patient/family education   OT Goals(Current goals can be found in the care plan section) Acute Rehab OT Goals Patient Stated Goal: Pt did not state. OT Goal Formulation: With patient Time For Goal Achievement: 11/20/12 Potential to Achieve Goals: Good  Visit Information  Last OT Received On: 11/06/12 History of Present Illness: 79 y.o. female with past medical history of diabetes, end-stage renal disease on hemodialysis Tuesday Thursday Saturday, hyperparathyroidism, depression, anemia who presented to the ED with a one to two-day history of productive cough of yellowish sputum, shortness of breath, generalized weakness, and upper airway congestion..  Pt + for Cdiff.       Prior Functioning     Home Living Family/patient expects to be discharged to:: Private residence (lives with nephew) Living Arrangements: Children;Other (Comment) ([place]) Available Help at Discharge: Family;Available 24 hours/day Type of Home: House Home Access: Stairs to enter [employer] of Steps: 3 Entrance Stairs-Rails: Right;Left Home Layout: One level Home Equipment: Walker - 2 wheels;Bedside commode;Wheelchair - manual;Cane - single point Additional Comments: pt takes  sponge bath Prior Function Level of Independence: Independent with assistive device(s) (occasional use of cane) Communication Communication: No difficulties Dominant Hand: Right         Vision/Perception Vision -  History Baseline Vision: No visual deficits Patient Visual Report: No change from baseline Vision - Assessment Eye Alignment: Within Functional Limits Vision Assessment: Vision not tested Perception Perception: Within Functional Limits Praxis Praxis: Intact   Cognition  Cognition Behavior During Therapy: WFL for tasks assessed/performed Overall Cognitive Status: Within Functional Limits for tasks assessed    Extremity/Trunk Assessment Upper Extremity Assessment Upper Extremity Assessment: Generalized weakness Lower Extremity Assessment Lower Extremity Assessment: Defer to PT evaluation Cervical / Trunk Assessment Cervical / Trunk Assessment: Normal     Mobility Transfers Transfers: Sit to Stand Sit to Stand: 4: Min assist;With upper extremity assist;From chair/3-in-1 Stand to Sit: 4: Min assist;With upper extremity assist;With armrests;To chair/3-in-1        Balance Balance Balance Assessed: Yes Static Standing Balance Static Standing - Balance Support: Right upper extremity supported;Left upper extremity supported Static Standing - Level of Assistance: 4: Min assist Static Standing - Comment/# of Minutes: Pt with one LOB when standing at the sink without UE support.   End of Session OT - End of Session Equipment Utilized During Treatment: Rolling walker Activity Tolerance: Patient limited by fatigue Patient left: in chair;with call bell/phone within reach Nurse Communication: Mobility status     [name] OTR/L Pager number [phone] 11/06/2012, 12:32 PM

## 2012-11-06 NOTE — Progress Notes (Signed)
S: diarrhea better.  Still with prod cough but she says it is better O:BP 110/65  Pulse 87  Temp(Src) 98.1 F (36.7 C) (Oral)  Resp 20  Ht 5\' 3"  (1.6 m)  Wt 92 kg (202 lb 13.2 oz)  BMI 35.94 kg/m2  SpO2 97% No intake or output data in the 24 hours ending 11/06/12 1040 Weight change:   . allopurinol  100 mg Oral Daily  . amoxicillin-clavulanate  1 tablet Oral BID WC  . darbepoetin (ARANESP) injection - DIALYSIS  40 mcg Intravenous Q Thu-HD  . diltiazem  30 mg Oral Q6H  . doxercalciferol  3 mcg Intravenous Q T,Th,Sa-HD  . feeding supplement (NEPRO CARB STEADY)  237 mL Oral BID BM  . guaiFENesin  1,200 mg Oral BID  . heparin  5,000 Units Subcutaneous Q8H  . insulin aspart  0-9 Units Subcutaneous TID WC  . levalbuterol  0.63 mg Nebulization BID  . loratadine  10 mg Oral Daily  . metroNIDAZOLE  500 mg Oral Q8H  .  morphine injection  1 mg Intravenous Once  . multivitamin  1 tablet Oral QHS  . saccharomyces boulardii  250 mg Oral BID  . sevelamer carbonate  800 mg Oral TID WC  . sodium chloride  3 mL Intravenous Q12H  . triamcinolone cream  1 application Topical BID   No results found. BMET    Component Value Date/Time   NA 136 11/06/2012 0415   K 3.7 11/06/2012 0415   CL 94* 11/06/2012 0415   CO2 28 11/06/2012 0415   GLUCOSE 135* 11/06/2012 0415   BUN 59* 11/06/2012 0415   CREATININE 6.55* 11/06/2012 0415   CREATININE 4.24* 12/26/2006 1500   CALCIUM 9.4 11/06/2012 0415   CALCIUM 8.9 12/05/2006 1357   GFRNONAA 5* 11/06/2012 0415   GFRAA 6* 11/06/2012 0415   CBC    Component Value Date/Time   WBC 19.3* 11/06/2012 0415   WBC 20.6* 10/10/2006 0835   RBC 3.69* 11/06/2012 0415   RBC 4.17 10/10/2006 0835   HGB 8.9* 11/06/2012 0415   HGB 10.1* 10/10/2006 0835   HCT 28.2* 11/06/2012 0415   HCT 32.0* 10/10/2006 0835   PLT 342 11/06/2012 0415   PLT 605* 10/10/2006 0835   MCV 76.4* 11/06/2012 0415   MCV 76.7* 10/10/2006 0835   MCH 24.1* 11/06/2012 0415   MCH 24.1* 10/10/2006 0835   MCHC 31.6  11/06/2012 0415   MCHC 31.5* 10/10/2006 0835   RDW 15.5 11/06/2012 0415   RDW 17.2* 10/10/2006 0835   LYMPHSABS 0.9 11/01/2012 0435   LYMPHSABS 0.9 10/10/2006 0835   MONOABS 1.3* 11/01/2012 0435   MONOABS 0.7 10/10/2006 0835   EOSABS 0.0 11/01/2012 0435   EOSABS 0.0 10/10/2006 0835   BASOSABS 0.0 11/01/2012 0435   BASOSABS 0.0 10/10/2006 0835   Exam: WUJ:WJXBJ and alert CVS:RRR Resp: clear bilat Abd:+ BS NTND Ext: no edema  Rt thigh AVG + bruit NEURO:CNI Ox3 no asterixis    Dialysis Orders: [facility] TTS - 4 hours Optiflux 180 2K 2.25 Ca 450/A 1.5 heparin 5000 load with 2000 mid tmt; access = right thigh AVGG profile 4 EDW 92 Hecterol 3 Epogen 4200 no IV Fe  Recent labs: Hgb 10.6 6/19 (trending up) and iPTH 325 4/26 418 1/23   Assessment: 1. C Diff colitis on metronidazole 2. Anemia start aranesp, Hg trending down 3. Sec HPTH on hectorol 4. HTN  5. ESRD, cont tts HD 6  Hflu sepsis/PNA- on Augmentin, per primary 7. SVT  A fib, now in NSR  Jamie Moselle  MD Pager [phone]    Cell  [phone] 11/06/2012, 10:41 AM

## 2012-11-07 DIAGNOSIS — R7881 Bacteremia: Secondary | ICD-10-CM

## 2012-11-07 DIAGNOSIS — A492 Hemophilus influenzae infection, unspecified site: Secondary | ICD-10-CM

## 2012-11-07 DIAGNOSIS — A413 Sepsis due to Hemophilus influenzae: Secondary | ICD-10-CM | POA: Diagnosis present

## 2012-11-07 LAB — CBC
MCH: 23.8 pg — ABNORMAL LOW (ref 26.0–34.0)
MCV: 76.4 fL — ABNORMAL LOW (ref 78.0–100.0)
Platelets: 383 10*3/uL (ref 150–400)
RDW: 15.7 % — ABNORMAL HIGH (ref 11.5–15.5)

## 2012-11-07 LAB — BASIC METABOLIC PANEL
Calcium: 9.8 mg/dL (ref 8.4–10.5)
Creatinine, Ser: 8.35 mg/dL — ABNORMAL HIGH (ref 0.50–1.10)
GFR calc Af Amer: 5 mL/min — ABNORMAL LOW (ref >90)
GFR calc non Af Amer: 4 mL/min — ABNORMAL LOW (ref >90)

## 2012-11-07 LAB — GLUCOSE, CAPILLARY
Glucose-Capillary: 131 mg/dL — ABNORMAL HIGH (ref 70–99)
Glucose-Capillary: 144 mg/dL — ABNORMAL HIGH (ref 70–99)
Glucose-Capillary: 176 mg/dL — ABNORMAL HIGH (ref 70–99)

## 2012-11-07 LAB — CULTURE, BLOOD (ROUTINE X 2)

## 2012-11-07 MED ORDER — SODIUM CHLORIDE 0.9 % IV SOLN: 100.0000 mL | INTRAVENOUS | Status: DC | PRN

## 2012-11-07 MED ORDER — PENTAFLUOROPROP-TETRAFLUOROETH EX AERO: 1.0000 "application " | INHALATION_SPRAY | CUTANEOUS | Status: DC | PRN

## 2012-11-07 MED ORDER — DOXERCALCIFEROL 4 MCG/2ML IV SOLN
INTRAVENOUS | Status: AC
  Administered 2012-11-07: 3 ug via INTRAVENOUS
  Filled 2012-11-07: qty 2

## 2012-11-07 MED ORDER — HEPARIN SODIUM (PORCINE) 1000 UNIT/ML DIALYSIS: 1000.0000 [IU] | INTRAMUSCULAR | Status: DC | PRN

## 2012-11-07 MED ORDER — LIDOCAINE HCL (PF) 1 % IJ SOLN: 5.0000 mL | INTRAMUSCULAR | Status: DC | PRN

## 2012-11-07 MED ORDER — ALTEPLASE 2 MG IJ SOLR
2.0000 mg | Freq: Once | INTRAMUSCULAR | Status: DC | PRN
  Filled 2012-11-07: qty 2

## 2012-11-07 MED ORDER — HEPARIN SODIUM (PORCINE) 1000 UNIT/ML DIALYSIS
5000.0000 [IU] | Freq: Once | INTRAMUSCULAR | Status: AC
  Administered 2012-11-07: 5000 [IU] via INTRAVENOUS_CENTRAL

## 2012-11-07 MED ORDER — NEPRO/CARBSTEADY PO LIQD: 237.0000 mL | ORAL | Status: DC | PRN

## 2012-11-07 MED ORDER — LIDOCAINE-PRILOCAINE 2.5-2.5 % EX CREA: 1.0000 "application " | TOPICAL_CREAM | CUTANEOUS | Status: DC | PRN

## 2012-11-07 NOTE — Care Management Note (Unsigned)
    Page 1 of 1   11/07/2012     3:28:09 PM   CARE MANAGEMENT NOTE 11/07/2012  Patient:  [name], [name]   Account Number:  [id number]  Date Initiated:  11/07/2012  Documentation initiated by:  Anastasios Melander  Subjective/Objective Assessment:   PT ADM WITH SEPSIS, PNA, RESP FAILURE.  PTA, PT RESIDES AT HOME WITH NEPHEW.     Action/Plan:   MET WITH PT TO DISCUSS DC PLANS.  PT STATES NEPHEW WORKS; UNSURE IF SHE WILL BE ABLE TO CARE FOR HERSELF AT HOME.   Anticipated DC Date:  11/10/2012   Anticipated DC Plan:  SKILLED NURSING FACILITY  In-house referral  Clinical Social Worker      DC Planning Services  CM consult      Choice offered to / List presented to:             Status of service:  In process, will continue to follow Medicare Important Message given?   (If response is "NO", the following Medicare IM given date fields will be blank) Date Medicare IM given:   Date Additional Medicare IM given:    Discharge Disposition:    Per UR Regulation:  Reviewed for med. necessity/level of care/duration of stay  If discussed at Long Length of Stay Meetings, dates discussed:    Comments:  11/07/12 Rosalba Totty,RN,BSN [phone] WILL CONSULT CSW TO FACILITATE POSSIBLE SNF FOR REHAB AT DC.  SHE STATES HAS BEEN AT SNF IN THE PAST; [facility] IS HER POA.

## 2012-11-07 NOTE — Consult Note (Addendum)
[facility]    Date of Admission:  10/31/2012               Day 8 of antibiotics for pneumonia        Day 3 amoxicillin clavulanate        Day 8 metronidazole       Reason for Consult: Increasing leukocytosis in the setting of a Haemophilus influenza bacteremia and C. Difficile colitis    Referring Physician: Dr. Gaynelle Adu  Active Problems:   Leukocytosis   Pneumonia   C. difficile colitis   Sepsis due to Haemophilus influenzae   ESRD on dialysis   Hyperkalemia   Diabetes mellitus   Depression   Anemia   Acute respiratory distress   Chronic diarrhea   . allopurinol  100 mg Oral Daily  . amoxicillin-clavulanate  1 tablet Oral Q breakfast  . darbepoetin (ARANESP) injection - DIALYSIS  40 mcg Intravenous Q Thu-HD  . diltiazem  120 mg Oral Daily  . doxercalciferol  3 mcg Intravenous Q T,Th,Sa-HD  . feeding supplement (NEPRO CARB STEADY)  237 mL Oral BID BM  . guaiFENesin  1,200 mg Oral BID  . heparin  5,000 Units Subcutaneous Q8H  . insulin aspart  0-9 Units Subcutaneous TID WC  . levalbuterol  0.63 mg Nebulization BID  . loratadine  10 mg Oral Daily  . metroNIDAZOLE  500 mg Oral Q8H  .  morphine injection  1 mg Intravenous Once  . multivitamin  1 tablet Oral QHS  . saccharomyces boulardii  250 mg Oral BID  . sevelamer carbonate  800 mg Oral TID WC  . sodium chloride  3 mL Intravenous Q12H  . triamcinolone cream  1 application Topical BID    Recommendations: 1. Discontinue Augmentin 2. Continue metronidazole for at least one more week.  Assessment: She has increasing leukocytosis but otherwise is improving. Leukemoid reactions are frequent with C. Difficile colitis and I would not pursue any further diagnostic testing as long as she is improving. She probably had sufficient therapy for possible pneumonia and Haemophilus influenza bacteremia. I will stop Augmentin today which will also help her resolve her colitis. Given her  age and end-stage renal disease I would prefer oral vancomycin over metronidazole. She has history of rash with IV vancomycin. She probably would tolerate oral vancomycin quite well but since she is improving I will simply continue metronidazole.  HPI: [name] is a 79 y.o. female for hemodialysis patient who was admitted on June 26 with a several day history of cough, sputum production, shortness of breath and acute on chronic diarrhea. One blood culture grew Haemophilus influenzae and one other set grew coagulase-negative staph. Chest x-ray revealed bibasilar densities. She was also found to be positive for C. Difficile colitis by PCR. She says she is feeling significantly better with decreased cough and shortness of breath and decreasing diarrhea. However her white blood cell count has risen steadily to 27,000.   Review of Systems: Constitutional: positive for fevers, negative for chills, sweats and weight loss Eyes: negative Ears, nose, mouth, throat, and face: negative Respiratory: positive for cough, dyspnea on exertion and sputum, negative for asthma, hemoptysis and pleurisy/chest pain Cardiovascular: negative Gastrointestinal: positive for diarrhea, negative for abdominal pain, constipation, nausea and vomiting Genitourinary:negative  Past Medical History  Diagnosis Date  . Diabetes mellitus   . Anemia   . Hyperparathyroidism   . Depression   . Obesity   .  Osteoarthritis   . MRSA (methicillin resistant staph aureus) culture positive   . Gout   . C. difficile diarrhea   . Chronic kidney disease   . Diabetes mellitus 10/31/2012    History  Substance Use Topics  . Smoking status: Never Smoker   . Smokeless tobacco: Never Used  . Alcohol Use: No    Family History  Problem Relation Age of Onset  . Cancer Father   . Diabetes Sister    Allergies  Allergen Reactions  . Codeine Hives  . Vancomycin Rash    OBJECTIVE: Blood pressure 117/50, pulse 95, temperature 97.7  F (36.5 C), temperature source Oral, resp. rate 16, height 5\' 3"  (1.6 m), weight 91.5 kg (201 lb 11.5 oz), SpO2 97.00%. General: she is alert and in no distress. She just returned from hemodialysis Skin: no rash. Her right thigh hemodialysis access appears normal Oral: No oropharyngeal lesions Lungs: clear Cor: regular S1 and S2 with no murmurs heard Abdomen: obese, soft nontender. Quiet bowel sounds. Mood and affect normal  Microbiology: Recent Results (from the past 240 hour(s))  CULTURE, BLOOD (ROUTINE X 2)     Status: None   Collection Time    10/31/12 12:40 PM      Result Value Range Status   Specimen Description BLOOD ARM LEFT   Final   Special Requests BOTTLES DRAWN AEROBIC ONLY 5CC   Final   Culture  Setup Time 10/31/2012 17:22   Final   Culture     Final   Value: STAPHYLOCOCCUS SPECIES (COAGULASE NEGATIVE)     Note: THE SIGNIFICANCE OF ISOLATING THIS ORGANISM FROM A SINGLE SET OF BLOOD CULTURES WHEN MULTIPLE SETS ARE DRAWN IS UNCERTAIN. PLEASE NOTIFY THE MICROBIOLOGY DEPARTMENT WITHIN ONE WEEK IF SPECIATION AND SENSITIVITIES ARE REQUIRED.     Note: Gram Stain Report Called to,Read Back By and Verified With: DANIELLE BYERLY 11/01/12 1100 BY SMITHERSJ   Report Status 11/02/2012 FINAL   Final  CULTURE, BLOOD (ROUTINE X 2)     Status: None   Collection Time    10/31/12 12:45 PM      Result Value Range Status   Specimen Description BLOOD HAND LEFT   Final   Special Requests BOTTLES DRAWN AEROBIC ONLY 5CC   Final   Culture  Setup Time 10/31/2012 17:22   Final   Culture     Final   Value: HAEMOPHILUS INFLUENZAE     Note: REPORT FAXED BY REQUEST FAXED TO [facility] Doreene Adas [id number] BY CATAR Referred to [facility] in [place], [place] for Ser[place].     Note: Gram Stain Report Called to,Read Back By and Verified With: [facility] 11/02/12 1115 BY SMITHERSJ   Report Status PENDING   Incomplete  MRSA PCR SCREENING     Status: None   Collection  Time    10/31/12  3:33 PM      Result Value Range Status   MRSA by PCR NEGATIVE  NEGATIVE Final   Comment:            The GeneXpert MRSA Assay (FDA     approved for NASAL specimens     only), is one component of a     comprehensive MRSA colonization     surveillance program. It is not     intended to diagnose MRSA     infection nor to guide or     monitor treatment for     MRSA infections.  CLOSTRIDIUM DIFFICILE BY PCR  Status: Abnormal   Collection Time    10/31/12  3:33 PM      Result Value Range Status   C difficile by pcr POSITIVE (*) NEGATIVE Final   Comment: CRITICAL RESULT CALLED TO, READ BACK BY AND VERIFIED WITH:     D. FIELDS RN 17:10 10/31/12 ([email or web address])  CULTURE, BLOOD (ROUTINE X 2)     Status: None   Collection Time    10/31/12  7:05 PM      Result Value Range Status   Specimen Description BLOOD HEMODIALYSIS GRAFT THIGH   Final   Special Requests BOTTLES DRAWN AEROBIC AND ANAEROBIC 10CC   Final   Culture  Setup Time 11/01/2012 04:07   Final   Culture NO GROWTH 5 DAYS   Final   Report Status 11/07/2012 FINAL   Final  CULTURE, BLOOD (ROUTINE X 2)     Status: None   Collection Time    10/31/12  7:20 PM      Result Value Range Status   Specimen Description BLOOD HEMODIALYSIS GRAFT THIGH   Final   Special Requests BOTTLES DRAWN AEROBIC AND ANAEROBIC 10CC   Final   Culture  Setup Time 11/01/2012 04:08   Final   Culture NO GROWTH 5 DAYS   Final   Report Status 11/07/2012 FINAL   Final    Cliffton Asters, MD [facility] [phone] pager   [phone] cell 11/07/2012, 2:03 PM

## 2012-11-07 NOTE — Procedures (Signed)
I was present at this dialysis session. I have reviewed the session itself and made appropriate changes.   Vinson Moselle, MD [employer] 11/07/2012, 11:01 AM

## 2012-11-07 NOTE — Progress Notes (Signed)
[facility] Progress Note    [name] ZOX:[id number] DOB: May 04, 1933 DOA: 10/31/2012 PCP: Irena Cords, MD  Brief narrative: 79 y.o. female with past medical history of diabetes, end-stage renal disease on hemodialysis Tuesday Thursday Saturday, hyperparathyroidism, depression, anemia who presented to the ED with a one to two-day history of productive cough of yellowish sputum, shortness of breath, generalized weakness, and upper airway congestion. Patient denied fever or chills, no nausea, no vomiting, no abdominal pain, no melena, no hematemesis, no hematochezia, no headache, no focal neurological symptoms. Patient denied any lower extremity swelling or pain.   In the ED compressive metabolic profile done had a potassium of 5.8 chloride of 92 BUN of 62 creatinine of 11.99 glucose of 166 and albumin of 2.9. First set of troponin was negative. Pro BNP was 81191. CBC done had a white count of 21.9 hemoglobin of 11.3 and a left shift otherwise was within normal limits. EKG done showed a sinus tachycardia with low voltage. Chest x-ray which was done showed low volume in the chest. Scattered areas of predominantly linear density favored to represent atelectasis. ABG done showed a pH of 7.493, PCO2 of 31, PO2 of 63, bicarbonate of 24.  Pt was initially admitted to the step down unit and transferred to floor on 6/27, and overnight(6/27-28) pt went into afib with RVR and was started on cardizem drip and transferred to 2000unit .she tolerated to normal sinus rhythm and has maintained this.      Assessment/Plan: Atrial Fibrillation, transient -as above, In pt with resolving sepsis, Troponins neg  -She spontaneously converted on the cardizem drip to NSR and remaining in sinus in am of 6/28 - echo was obtained and showed an EF of 55-60, she was transitioned to oral cardizem -discussed pt with Dr Mayford Knife 6/27 regarding ?anticoagulation and she stated that for afib in setting of sepsis,  anticoagulation only recommended if Afib recurs. She(Dr. Mayford Knife) recommends for pt to follow with her oupt for Event Monitor following discharge. -So far A. fib has not recurred, follow  Sepsis, Haemophilus - due to CAP vs/ C diff coliits Blood pressure remaining stable today -1/2 blood culture id'ed as haemophilus -Dicussed pt with ID/ Dr Luciana Axe on 6/28 and he recommended changing levaquin to Augmentin for H.flu/PNA ,and to continue oral flagyl -On followup today 7/1, patient's WBC of up to 27>> officially consulted infectious disease, and patient seen by Dr. [name] and per discussion with him elevated WBC likely leukemoid reaction from the C. Difficile, Augmentin  DC'd patient has completed antibiotic course for pneumonia/H. flu  Hypoxic resp failure due to CAP Continue empiric antibiotic therapy - followup chest x-ray reveals basilar atelectasis. - no clear infiltrate  - suspect acute bronchitis- cough improved yellow green sputum- was changed to Augmentin as per ID recs - continue mucolytics and antitussives  C diff colitis in setting of chronic diarrhea since 2008 Continue Flagyl therapy with plan for prolonged treatment course -WBC beginning to trend down, diarrhea improving,-continue florastor and follow  Gram + cocci in 1/2 blood cx Coag neg staph which is likely a contaminant therefore, plan was to dc Zyvox on 6/26- was dc'ed on 6/27 and follow- of note, pt is MRSA screen negative - second bottle growing gram neg cocco-bacillus >> Haemophilus infuenza-  -as above d/c(6/28) Levaquin,  Augmentin discontinued as discussed above and follow -Appreciate infectious disease Hypotension Due to above -  norvasc was dc'ed on 6/25 -Resolved  ESRD on dialysis dialysis Tuesday, Thursdays and Saturdays - Nephrology  following  Hyperkalemia Resolved w/ HD, follow and recheck  Diabetes mellitus A1c 5.7 - CBG currently reasonably controlled  Anemia anemia of chronic disease secondary to  end-stage renal disease  Obesity  Code Status: FULL Family Communication: No family present at bedside Disposition Plan: to SNF when medically ready  Consultants: Nephrology PCCM Infectious disease Procedures: ECHO Study Conclusions  - Left ventricle: The cavity size was normal. Systolic function was normal. The estimated ejection fraction was in the range of 55% to 60%. Cannot exclude moderate hypokinesis of the entire myocardium. - Mitral valve: Calcified annulus. Mild to moderate regurgitation. - Left atrium: The atrium was mildly dilated. - Right ventricle: The cavity size was mildly dilated. Wall thickness was normal. - Pulmonary arteries: PA peak pressure: 36mm Hg (S).     Antibiotics: Linezolid 6/24>>>  Zosyn 6/24 Levaquin 6/25 >>6/28 Flagyl po 6/24 >> Augmentin 6/28>>7/1 DVT prophylaxis: SQ heparin  HPI/Subjective: Pt seen at dialysis today, states less diarrhea and cough better. Objective: Blood pressure 100/55, pulse 95, temperature 98.1 F (36.7 C), temperature source Oral, resp. rate 18, height 5\' 3"  (1.6 m), weight 91.5 kg (201 lb 11.5 oz), SpO2 97.00%.  Intake/Output Summary (Last 24 hours) at 11/07/12 1558 Last data filed at 11/07/12 1225  Gross per 24 hour  Intake    720 ml  Output    615 ml  Net    105 ml    Exam: General: No acute respiratory distress Lungs: Clear to auscultation bilaterally without wheezes or crackles Cardiovascular: Regular rate and rhythm without murmur gallop or rub normal S1 and S2 Abdomen: Obese, nontender, nondistended, soft, bowel sounds positive, no rebound, no ascites, no appreciable mass Extremities: No cyanosis, clubbing, or edema   Data Reviewed: Basic Metabolic Panel:  Recent Labs Lab 10/31/12 1706 11/01/12 0435 11/02/12 0551 11/04/12 0520 11/05/12 0505 11/06/12 0415 11/07/12 0450  NA 136 136 136 134* 137 136 136  K 5.8* 4.3 4.6 4.2 4.5 3.7 4.2  CL 92* 94* 94* 92* 95* 94* 93*  CO2 22 31 27 26  30 28 24   GLUCOSE 206* 164* 115* 135* 108* 135* 132*  BUN 68* 26* 51* 52* 33* 59* 81*  CREATININE 13.09* 6.22* 8.39* 6.97* 4.41* 6.55* 8.35*  CALCIUM 9.0 8.9 8.7 9.2 9.1 9.4 9.8  MG 2.2  --   --   --   --   --   --   PHOS  --  6.5*  --   --   --   --   --    Liver Function Tests: No results found for this basename: AST, ALT, ALKPHOS, BILITOT, PROT, ALBUMIN,  in the last 168 hours CBC:  Recent Labs Lab 11/01/12 0435  11/03/12 0646 11/04/12 0520 11/05/12 0505 11/06/12 0415 11/07/12 0450  WBC 17.3*  < > 19.8* 21.8* 18.3* 19.3* 27.5*  NEUTROABS 15.0*  --   --   --   --   --   --   HGB 9.2*  < > 8.5* 8.5* 9.1* 8.9* 9.5*  HCT 29.3*  < > 27.6* 27.3* 29.4* 28.2* 30.5*  MCV 76.7*  < > 77.7* 76.9* 78.0 76.4* 76.4*  PLT 202  < > 280 281 280 342 383  < > = values in this interval not displayed. Cardiac Enzymes:  Recent Labs Lab 11/03/12 2341 11/04/12 0520 11/04/12 1128  TROPONINI <0.30 <0.30 <0.30   BNP (last 3 results)  Recent Labs  10/31/12 1220  PROBNP 19230.0*   CBG:  Recent Labs Lab  11/06/12 1610 11/06/12 1134 11/06/12 1632 11/06/12 2141 11/07/12 0557  GLUCAP 135* 137* 171* 180* 131*    Recent Results (from the past 240 hour(s))  CULTURE, BLOOD (ROUTINE X 2)     Status: None   Collection Time    10/31/12 12:40 PM      Result Value Range Status   Specimen Description BLOOD ARM LEFT   Final   Special Requests BOTTLES DRAWN AEROBIC ONLY 5CC   Final   Culture  Setup Time 10/31/2012 17:22   Final   Culture     Final   Value: STAPHYLOCOCCUS SPECIES (COAGULASE NEGATIVE)     Note: THE SIGNIFICANCE OF ISOLATING THIS ORGANISM FROM A SINGLE SET OF BLOOD CULTURES WHEN MULTIPLE SETS ARE DRAWN IS UNCERTAIN. PLEASE NOTIFY THE MICROBIOLOGY DEPARTMENT WITHIN ONE WEEK IF SPECIATION AND SENSITIVITIES ARE REQUIRED.     Note: Gram Stain Report Called to,Read Back By and Verified With: DANIELLE BYERLY 11/01/12 1100 BY SMITHERSJ   Report Status 11/02/2012 FINAL   Final  CULTURE,  BLOOD (ROUTINE X 2)     Status: None   Collection Time    10/31/12 12:45 PM      Result Value Range Status   Specimen Description BLOOD HAND LEFT   Final   Special Requests BOTTLES DRAWN AEROBIC ONLY 5CC   Final   Culture  Setup Time 10/31/2012 17:22   Final   Culture     Final   Value: HAEMOPHILUS INFLUENZAE     Note: REPORT FAXED BY REQUEST FAXED TO [facility] Doreene Adas [id number] BY CATAR Referred to [facility] in [place], [place] for Serotyping.     Note: Gram Stain Report Called to,Read Back By and Verified With: [facility] 11/02/12 1115 BY SMITHERSJ   Report Status PENDING   Incomplete  MRSA PCR SCREENING     Status: None   Collection Time    10/31/12  3:33 PM      Result Value Range Status   MRSA by PCR NEGATIVE  NEGATIVE Final   Comment:            The GeneXpert MRSA Assay (FDA     approved for NASAL specimens     only), is one component of a     comprehensive MRSA colonization     surveillance program. It is not     intended to diagnose MRSA     infection nor to guide or     monitor treatment for     MRSA infections.  CLOSTRIDIUM DIFFICILE BY PCR     Status: Abnormal   Collection Time    10/31/12  3:33 PM      Result Value Range Status   C difficile by pcr POSITIVE (*) NEGATIVE Final   Comment: CRITICAL RESULT CALLED TO, READ BACK BY AND VERIFIED WITH:     D. FIELDS RN 17:10 10/31/12 ([email or web address])  CULTURE, BLOOD (ROUTINE X 2)     Status: None   Collection Time    10/31/12  7:05 PM      Result Value Range Status   Specimen Description BLOOD HEMODIALYSIS GRAFT THIGH   Final   Special Requests BOTTLES DRAWN AEROBIC AND ANAEROBIC 10CC   Final   Culture  Setup Time 11/01/2012 04:07   Final   Culture NO GROWTH 5 DAYS   Final   Report Status 11/07/2012 FINAL   Final  CULTURE, BLOOD (ROUTINE X 2)     Status: None   Collection  Time    10/31/12  7:20 PM      Result Value Range Status   Specimen Description BLOOD HEMODIALYSIS GRAFT THIGH    Final   Special Requests BOTTLES DRAWN AEROBIC AND ANAEROBIC 10CC   Final   Culture  Setup Time 11/01/2012 04:08   Final   Culture NO GROWTH 5 DAYS   Final   Report Status 11/07/2012 FINAL   Final     Studies:  I  have reviewed Recent x-ray studies in detail.  Scheduled Meds:  Scheduled Meds: . allopurinol  100 mg Oral Daily  . darbepoetin (ARANESP) injection - DIALYSIS  40 mcg Intravenous Q Thu-HD  . diltiazem  120 mg Oral Daily  . doxercalciferol  3 mcg Intravenous Q T,Th,Sa-HD  . feeding supplement (NEPRO CARB STEADY)  237 mL Oral BID BM  . guaiFENesin  1,200 mg Oral BID  . heparin  5,000 Units Subcutaneous Q8H  . insulin aspart  0-9 Units Subcutaneous TID WC  . levalbuterol  0.63 mg Nebulization BID  . loratadine  10 mg Oral Daily  . metroNIDAZOLE  500 mg Oral Q8H  .  morphine injection  1 mg Intravenous Once  . multivitamin  1 tablet Oral QHS  . saccharomyces boulardii  250 mg Oral BID  . sevelamer carbonate  800 mg Oral TID WC  . sodium chloride  3 mL Intravenous Q12H  . triamcinolone cream  1 application Topical BID   Time spent on care of this patient:   Kela Millin, MD  [phone] [facility] Office  [phone] Pager - Text Page per Amion as per below:  On-Call/Text Page:      [email or web address]      password [email or web address]  If 7PM-7AM, please contact night-coverage [email or web address] Password [email or web address] 11/07/2012, 3:58 PM   LOS: 7 days

## 2012-11-07 NOTE — Progress Notes (Signed)
Physical Therapy Treatment Patient Details Name: [name] MRN: [id number] DOB: [birth year] Today's Date: 11/07/2012 Time: 4098-1191 PT Time Calculation (min): 38 min  PT Assessment / Plan / Recommendation  PT Comments   Pt feeling weak this PM after HD.  Pt making slow progress and continues need to total (A) to complete pericare.  Therefore updated d/c plans to SNF.  Will continue to follow.  Follow Up Recommendations  SNF     Equipment Recommendations  None recommended by PT    Frequency Min 3X/week   Progress towards PT Goals Progress towards PT goals: Progressing toward goals  Plan Discharge plan needs to be updated    Precautions / Restrictions Precautions Precautions: Fall   Pertinent Vitals/Pain No c/o pain    Mobility  Bed Mobility Bed Mobility: Supine to Sit Supine to Sit: 4: Min guard;With rails Details for Bed Mobility Assistance: Minguard for safety with cues for hand placement and heavy use of rail Transfers Transfers: Sit to Stand;Stand to Sit Sit to Stand: 4: Min assist;With upper extremity assist;From chair/3-in-1 Stand to Sit: 4: Min assist;With upper extremity assist;With armrests;To chair/3-in-1 Details for Transfer Assistance: (A) to initiate transfer with cues for hand placement Ambulation/Gait Ambulation/Gait Assistance: 4: Min assist Ambulation Distance (Feet): 12 Feet Assistive device: Rolling walker Ambulation/Gait Assistance Details: (A) to manage RW and maintain balance with cues for RW placement. Limited due to generalized weakness Gait Pattern: Step-through pattern;Decreased stride length;Shuffle Gait velocity: decreased Stairs: No Wheelchair Mobility Wheelchair Mobility: No    Exercises     PT Diagnosis:    PT Problem List:   PT Treatment Interventions:     PT Goals (current goals can now be found in the care plan section) Acute Rehab PT Goals Patient Stated Goal: To get stronger PT Goal Formulation: With patient Time For Goal  Achievement: 11/12/12 Potential to Achieve Goals: Good  Visit Information  Last PT Received On: 11/07/12 Assistance Needed: +1 History of Present Illness: 79 y.o. female with past medical history of diabetes, end-stage renal disease on hemodialysis Tuesday Thursday Saturday, hyperparathyroidism, depression, anemia who presented to the ED with a one to two-day history of productive cough of yellowish sputum, shortness of breath, generalized weakness, and upper airway congestion..  Pt + for Cdiff.    Subjective Data  Subjective: "I'm weak but I'll get up." Patient Stated Goal: To get stronger   Cognition  Cognition Arousal/Alertness: Awake/alert Behavior During Therapy: WFL for tasks assessed/performed Overall Cognitive Status: Within Functional Limits for tasks assessed    Balance  Static Standing Balance Static Standing - Balance Support: Right upper extremity supported;Left upper extremity supported Static Standing - Level of Assistance: 4: Min assist Static Standing - Comment/# of Minutes: ~3 minutes to complete pericare with total (A)   End of Session PT - End of Session Equipment Utilized During Treatment: Gait belt;Oxygen (2L) Activity Tolerance: Patient tolerated treatment well Patient left: in chair;with call bell/phone within reach Nurse Communication: Mobility status   GP     [name] 11/07/2012, 4:45 PM Jake Shark, PT DPT [phone]

## 2012-11-07 NOTE — Progress Notes (Signed)
Subjective: No complaints, on dialysis  Objective Filed Vitals:   11/07/12 1000 11/07/12 1031 11/07/12 1039 11/07/12 1058  BP: 89/42 85/39 96/39  94/53  Pulse: 96 96 94 96  Temp:      TempSrc:      Resp: 14 14 16 16   Height:      Weight:      SpO2: 99%  98%     CBC:  Recent Labs Lab 10/31/12 1220  11/01/12 0435  11/05/12 0505 11/06/12 0415 11/07/12 0450  WBC 21.9*  < > 17.3*  < > 18.3* 19.3* 27.5*  NEUTROABS 19.6*  --  15.0*  --   --   --   --   HGB 11.3*  < > 9.2*  < > 9.1* 8.9* 9.5*  HCT 35.6*  < > 29.3*  < > 29.4* 28.2* 30.5*  MCV 77.1*  < > 76.7*  < > 78.0 76.4* 76.4*  PLT 225  < > 202  < > 280 342 383  < > = values in this interval not displayed. Basic Metabolic Panel:  Recent Labs Lab 11/01/12 0435  11/05/12 0505 11/06/12 0415 11/07/12 0450  NA 136  < > 137 136 136  K 4.3  < > 4.5 3.7 4.2  CL 94*  < > 95* 94* 93*  CO2 31  < > 30 28 24   GLUCOSE 164*  < > 108* 135* 132*  BUN 26*  < > 33* 59* 81*  CREATININE 6.22*  < > 4.41* 6.55* 8.35*  CALCIUM 8.9  < > 9.1 9.4 9.8  PHOS 6.5*  --   --   --   --   < > = values in this interval not displayed.  Physical Exam:  Blood pressure 94/53, pulse 96, temperature 97.3 F (36.3 C), temperature source Oral, resp. rate 16, height 5\' 3"  (1.6 m), weight 94.4 kg (208 lb 1.8 oz), SpO2 98.00%.  Exam: ZOX:WRUEA and alert CVS:RRR Resp: clear bilat Abd:+ BS NTND Ext: no edema  Rt thigh AVG + bruit NEURO:CNI Ox3 no asterixis  Dialysis Orders: Center: [facility] TTS - 4 hours Optiflux 180 2K 2.25 Ca 450/A 1.5 heparin 5000 load with 2000 mid tmt; access = right thigh AVGG profile 4 EDW 92 Hecterol 3 Epogen 4200 no IV Fe  Recent labs: Hgb 10.6 6/19 (trending up) and iPTH 325 4/26 418 1/23   Assessment/Plan 1. C Diff colitis on metronidazole 2. Hflu sepsis/PNA- on Augmentin, per primary 3. Anemia start aranesp, Hg trending down 4. Sec HPTH on hectorol 5. HTN  6. ESRD, cont tts HD 7. SVT A fib, now in NSR   Jamie Moselle   MD [facility] Pager [phone]    Cell  [phone] 11/07/2012, 11:06 AM

## 2012-11-08 LAB — CBC
HCT: 27.9 % — ABNORMAL LOW (ref 36.0–46.0)
MCH: 24.6 pg — ABNORMAL LOW (ref 26.0–34.0)
MCV: 78.8 fL (ref 78.0–100.0)
Platelets: 356 10*3/uL (ref 150–400)
RBC: 3.54 MIL/uL — ABNORMAL LOW (ref 3.87–5.11)

## 2012-11-08 LAB — BASIC METABOLIC PANEL
BUN: 34 mg/dL — ABNORMAL HIGH (ref 6–23)
CO2: 29 mEq/L (ref 19–32)
Calcium: 9.1 mg/dL (ref 8.4–10.5)
Creatinine, Ser: 5.14 mg/dL — ABNORMAL HIGH (ref 0.50–1.10)

## 2012-11-08 LAB — GLUCOSE, CAPILLARY
Glucose-Capillary: 137 mg/dL — ABNORMAL HIGH (ref 70–99)
Glucose-Capillary: 175 mg/dL — ABNORMAL HIGH (ref 70–99)
Glucose-Capillary: 164 mg/dL — ABNORMAL HIGH (ref 70–99)

## 2012-11-08 MED ORDER — DARBEPOETIN ALFA-POLYSORBATE 60 MCG/0.3ML IJ SOLN
60.0000 ug | INTRAMUSCULAR | Status: DC
  Filled 2012-11-08: qty 0.3

## 2012-11-08 NOTE — Progress Notes (Signed)
Subjective:  Up in chair no cos Objective Vital signs in last 24 hours: Filed Vitals:   11/07/12 2122 11/08/12 0434 11/08/12 0457 11/08/12 0700  BP:  100/36 100/36   Pulse: 95 86 75   Temp:  98.3 F (36.8 C) 98.3 F (36.8 C)   TempSrc:  Oral Oral   Resp: 16 18 18    Height:      Weight:  92.035 kg (202 lb 14.4 oz) 92.035 kg (202 lb 14.4 oz)   SpO2: 99% 100% 100% 92%   Weight change: 1.3 kg (2 lb 13.9 oz)  Intake/Output Summary (Last 24 hours) at 11/08/12 1318 Last data filed at 11/08/12 0700  Gross per 24 hour  Intake    360 ml  Output      0 ml  Net    360 ml   Labs: Basic Metabolic Panel:  Recent Labs Lab 11/06/12 0415 11/07/12 0450 11/08/12 0448  NA 136 136 141  K 3.7 4.2 3.7  CL 94* 93* 100  CO2 28 24 29   GLUCOSE 135* 132* 117*  BUN 59* 81* 34*  CREATININE 6.55* 8.35* 5.14*  CALCIUM 9.4 9.8 9.1   Liver Function Tests: No results found for this basename: AST, ALT, ALKPHOS, BILITOT, PROT, ALBUMIN,  in the last 168 hours No results found for this basename: LIPASE, AMYLASE,  in the last 168 hours No results found for this basename: AMMONIA,  in the last 168 hours CBC:  Recent Labs Lab 11/04/12 0520 11/05/12 0505 11/06/12 0415 11/07/12 0450 11/08/12 0448  WBC 21.8* 18.3* 19.3* 27.5* 29.1*  HGB 8.5* 9.1* 8.9* 9.5* 8.7*  HCT 27.3* 29.4* 28.2* 30.5* 27.9*  MCV 76.9* 78.0 76.4* 76.4* 78.8  PLT 281 280 342 383 356   Cardiac Enzymes:  Recent Labs Lab 11/03/12 2341 11/04/12 0520 11/04/12 1128  TROPONINI <0.30 <0.30 <0.30   CBG:  Recent Labs Lab 11/07/12 0557 11/07/12 1634 11/07/12 2118 11/08/12 0613 11/08/12 1121  GLUCAP 131* 144* 176* 114* 137*    Iron Studies: No results found for this basename: IRON, TIBC, TRANSFERRIN, FERRITIN,  in the last 72 hours Studies/Results: No results found. Medications:   . allopurinol  100 mg Oral Daily  . darbepoetin (ARANESP) injection - DIALYSIS  40 mcg Intravenous Q Thu-HD  . diltiazem  120 mg Oral  Daily  . doxercalciferol  3 mcg Intravenous Q T,Th,Sa-HD  . feeding supplement (NEPRO CARB STEADY)  237 mL Oral BID BM  . guaiFENesin  1,200 mg Oral BID  . heparin  5,000 Units Subcutaneous Q8H  . insulin aspart  0-9 Units Subcutaneous TID WC  . loratadine  10 mg Oral Daily  . metroNIDAZOLE  500 mg Oral Q8H  .  morphine injection  1 mg Intravenous Once  . multivitamin  1 tablet Oral QHS  . saccharomyces boulardii  250 mg Oral BID  . sevelamer carbonate  800 mg Oral TID WC  . sodium chloride  3 mL Intravenous Q12H  . triamcinolone cream  1 application Topical BID    Physical Exam: General: Alert ,NAD, Appropriate BF Heart: RRR, 1/6 sem lsb, no rub Lungs: CTA blaterally Abdomen: BS pos, soft, nontender Extremities: Dialysis Access:  No pedal  Edema, right  Femoral avgg pos. Bruit   Dialysis ([facility] TTS) 4hrs   2.25Ca  EDW 92kg   Heparin 5000 / 2000   R thigh AVGG   prof 4   Hectorol 3ug   Epogen 4200 Recent labs: Hgb 10.6 6/19 (trending up) and  iPTH 325 4/26 418 1/23   Assessment/Plan  1. C Diff colitis on metronidazole/ Diarrhea resolving  2. Hflu sepsis/PNA-  Now off Augmentin, per ID / Dr. [name] following 3. Anemia- HGB 8.7 and starting aranesp, Hb trending down 4. Sec HPTH on hectorol and  phosbinder 5. HTN = no excess vol. At edw but starting to eat more . 2 l uf in am with hd/ oN Cardizem for A. Fib on admit fu bp on hd may need to taper down IF bp drops on hd 6. ESRD, cont tts HD 7. SVT A fib, now in NSR on Diltiazem po / bp tolerating with HD so far.   Lenny Pastel, PA-C [facility] [phone] 11/08/2012,1:18 PM  LOS: 8 days   Patient seen and examined.  Agree with assessment and plan as above. Vinson Moselle  MD Pager [phone]    Cell  [phone] 11/08/2012, 2:17 PM

## 2012-11-08 NOTE — Progress Notes (Signed)
Physical Therapy Treatment Patient Details Name: [name] MRN: [id number] DOB: 1932/12/27 Today's Date: 11/08/2012 Time: 9528-4132 PT Time Calculation (min): 30 min  PT Assessment / Plan / Recommendation  PT Comments   Pt admitted with SOB, weakness, hyperkalemia and Cdiff.  . Pt currently with functional limitations due to generalized weakness and decr safety awareness. Pt will benefit from skilled PT to increase their independence and safety with mobility to allow discharge to home after a SNF stay with therapy.     Follow Up Recommendations  SNF                 Equipment Recommendations  None recommended by PT    Recommendations for Other Services OT consult  Frequency Min 3X/week   Progress towards PT Goals Progress towards PT goals: Progressing toward goals  Plan Current plan remains appropriate    Precautions / Restrictions Precautions Precautions: Fall Restrictions Weight Bearing Restrictions: No   Pertinent Vitals/Pain HR 113-140, no pain    Mobility  Bed Mobility Bed Mobility: Supine to Sit Supine to Sit: With rails;5: Supervision Details for Bed Mobility Assistance: Needed cues for hand placement and heavy use of rail Transfers Transfers: Sit to Stand;Stand to Sit Sit to Stand: 4: Min assist;With upper extremity assist;From bed Stand to Sit: 4: Min assist;With upper extremity assist;With armrests;To chair/3-in-1 Details for Transfer Assistance: (A) to initiate transfer with cues for hand placement Ambulation/Gait Ambulation/Gait Assistance: 4: Min assist Ambulation Distance (Feet): 225 Feet Assistive device: Rolling walker Ambulation/Gait Assistance Details: A to steer RW at times.  Pt able to maintain balance throughtout ambulation.  Pt was limited due to generalized weakness.  Improved activity tolerance today with pt increasing ambulation distance significantly.  Needed several standing rest breaks.   Gait Pattern: Step-through pattern;Decreased stride  length;Shuffle Gait velocity: decreased Stairs: No Wheelchair Mobility Wheelchair Mobility: No    PT Goals (current goals can now be found in the care plan section) Acute Rehab PT Goals PT Goal Formulation: With patient Time For Goal Achievement: 11/12/12 Potential to Achieve Goals: Good  Visit Information  Last PT Received On: 11/08/12 Assistance Needed: +1 History of Present Illness: 79 y.o. female with past medical history of diabetes, end-stage renal disease on hemodialysis Tuesday Thursday Saturday, hyperparathyroidism, depression, anemia who presented to the ED with a one to two-day history of productive cough of yellowish sputum, shortness of breath, generalized weakness, and upper airway congestion..  Pt + for Cdiff.    Subjective Data  Subjective: "I want to walk further than yesterday."   Cognition  Cognition Arousal/Alertness: Awake/alert Behavior During Therapy: WFL for tasks assessed/performed Overall Cognitive Status: Within Functional Limits for tasks assessed    Balance  Static Standing Balance Static Standing - Balance Support: Right upper extremity supported;Left upper extremity supported;During functional activity Static Standing - Level of Assistance: 5: Stand by assistance Static Standing - Comment/# of Minutes: 2 minutes to adjust gown  End of Session PT - End of Session Equipment Utilized During Treatment: Gait belt Activity Tolerance: Patient tolerated treatment well Patient left: with call bell/phone within reach;in bed;with bed alarm set Nurse Communication: Mobility status      INGOLD,Jerid Catherman 11/08/2012, 10:59 AM [facility] [phone] (pager)

## 2012-11-08 NOTE — Progress Notes (Signed)
Clinical Social Work Department BRIEF PSYCHOSOCIAL ASSESSMENT 11/08/2012  Patient:  [name], [name]     Account Number:  [id number]     Admit date:  10/31/2012  Clinical Social Worker:  Robin Searing  Date/Time:  11/08/2012 02:48 PM  Referred by:  Care Management  Date Referred:  11/08/2012 Referred for  SNF Placement   Other Referral:   Interview type:  Other - See comment Other interview type:   Patient and niece    PSYCHOSOCIAL DATA Living Status:  FAMILY Admitted from facility:   Level of care:   Primary support name:  [name] Primary support relationship to patient:  FAMILY Degree of support available:   Minimal. 28 yo nephew lives with her but is in and out per her report.    CURRENT CONCERNS Current Concerns  Post-Acute Placement   Other Concerns:    SOCIAL WORK ASSESSMENT / PLAN Met with patient. Explained options for placement for short term SNF. She is agreeable. States she has been twice before, prefers to be close to her family. Discussed several facilities nearby with her and she was open to these options. Also discussed with niece/POA who also is agreeable.   Assessment/plan status:  Other - See comment Other assessment/ plan:   Will complete FL2 and PASARR for SNF search   Information/referral to community resources:   SNF list    PATIENT'S/FAMILY'S RESPONSE TO PLAN OF CARE: Patient was very pleasant and open with CSW. She is willing to consider SNF and we will advise as offers are received.       Reece Levy, MSW [phone]

## 2012-11-08 NOTE — Progress Notes (Addendum)
Clinical Social Work Department CLINICAL SOCIAL WORK PLACEMENT NOTE 11/08/2012  Patient:  AISHI, COURTS  Account Number:  [id number] Admit date:  10/31/2012  Clinical Social Worker:  Robin Searing  Date/time:  11/08/2012 03:34 PM  Clinical Social Work is seeking post-discharge placement for this patient at the following level of care:   SKILLED NURSING   (*CSW will update this form in Epic as items are completed)   11/08/2012  Patient/family provided with [facility] Department of Clinical Social Work's list of facilities offering this level of care within the geographic area requested by the patient (or if unable, by the patient's family).  11/08/2012  Patient/family informed of their freedom to choose among providers that offer the needed level of care, that participate in Medicare, Medicaid or managed care program needed by the patient, have an available bed and are willing to accept the patient.  11/08/2012  Patient/family informed of [facility]' ownership interest in [facility], as well as of the fact that they are under no obligation to receive care at this facility.  PASARR submitted to EDS on 11/08/2012 PASARR number received from EDS on 11/08/2012  FL2 transmitted to all facilities in geographic area requested by pt/family on  11/08/2012 FL2 transmitted to all facilities within larger geographic area on   Patient informed that his/her managed care company has contracts with or will negotiate with  certain facilities, including the following:     Patient/family informed of bed offers received:  11-09-12 Patient chooses bed at [facility] Physician recommends and patient chooses bed at    Patient to be transferred to [facility] on 11-10-12   Patient to be transferred to facility by [facility]  The following physician request were entered in Epic:   Additional Comments:  Reece Levy, MSW [phone]

## 2012-11-08 NOTE — Progress Notes (Signed)
Patient ID: [name], female   DOB: [birth year], 79 y.o.   MRN: [id number]         [facility]    Date of Admission:  10/31/2012           Day 9 metronidazole Active Problems:   Leukocytosis   Pneumonia   C. difficile colitis   Sepsis due to Haemophilus influenzae   ESRD on dialysis   Hyperkalemia   Diabetes mellitus   Depression   Anemia   Acute respiratory distress   Chronic diarrhea   . allopurinol  100 mg Oral Daily  . darbepoetin (ARANESP) injection - DIALYSIS  40 mcg Intravenous Q Thu-HD  . diltiazem  120 mg Oral Daily  . doxercalciferol  3 mcg Intravenous Q T,Th,Sa-HD  . feeding supplement (NEPRO CARB STEADY)  237 mL Oral BID BM  . guaiFENesin  1,200 mg Oral BID  . heparin  5,000 Units Subcutaneous Q8H  . insulin aspart  0-9 Units Subcutaneous TID WC  . loratadine  10 mg Oral Daily  . metroNIDAZOLE  500 mg Oral Q8H  .  morphine injection  1 mg Intravenous Once  . multivitamin  1 tablet Oral QHS  . saccharomyces boulardii  250 mg Oral BID  . sevelamer carbonate  800 mg Oral TID WC  . sodium chloride  3 mL Intravenous Q12H  . triamcinolone cream  1 application Topical BID    Subjective: She is feeling much better today and states " I feel like myself today." Her cough and shortness of breath have improved and her stools are less frequent and starting to be formed. She has no abdominal pain and states that her appetite is improving.  Review of Systems: Pertinent items are noted in HPI.  Past Medical History  Diagnosis Date  . Diabetes mellitus   . Anemia   . Hyperparathyroidism   . Depression   . Obesity   . Osteoarthritis   . MRSA (methicillin resistant staph aureus) culture positive   . Gout   . C. difficile diarrhea   . Chronic kidney disease   . Diabetes mellitus 10/31/2012    History  Substance Use Topics  . Smoking status: Never Smoker   . Smokeless tobacco: Never Used  . Alcohol Use: No    Family History    Problem Relation Age of Onset  . Cancer Father   . Diabetes Sister     Allergies  Allergen Reactions  . Codeine Hives  . Vancomycin Rash    Objective: Temp:  [97.7 F (36.5 C)-98.5 F (36.9 C)] 98.3 F (36.8 C) (07/02 0457) Pulse Rate:  [75-95] 75 (07/02 0457) Resp:  [16-18] 18 (07/02 0457) BP: (100-117)/(36-55) 100/36 mmHg (07/02 0457) SpO2:  [92 %-100 %] 92 % (07/02 0700) Weight:  [91.5 kg (201 lb 11.5 oz)-92.035 kg (202 lb 14.4 oz)] 92.035 kg (202 lb 14.4 oz) (07/02 0457)  General: She is alert and smiling reading the newspaper Skin: No rash Lungs: Clear Cor: Distant irregular heart sounds Abdomen: Soft and nontender Mood and affect: Normal  Lab Results Lab Results  Component Value Date   WBC 29.1* 11/08/2012   HGB 8.7* 11/08/2012   HCT 27.9* 11/08/2012   MCV 78.8 11/08/2012   PLT 356 11/08/2012    Lab Results  Component Value Date   CREATININE 5.14* 11/08/2012   BUN 34* 11/08/2012   NA 141 11/08/2012   K 3.7 11/08/2012   CL 100 11/08/2012   CO2 29  11/08/2012     Assessment: She has persistent leukocytosis but otherwise is showing clear improvement after therapy for Haemophilus influenza bacteremia and probable pneumonia and on treatment for concomitant C. difficile colitis. I would not feel compelled to continue to check daily CBCs. Her clinical course is much more helpful in determining duration of therapy.  Plan: 1. Continue oral metronidazole for 6 more days  Cliffton Asters, MD [facility] [phone] pager   [phone] cell 11/08/2012, 11:58 AM

## 2012-11-08 NOTE — Progress Notes (Signed)
[facility] Progress Note    [name] WUJ:[id number] DOB: Sep 21, 1932 DOA: 10/31/2012 PCP: Irena Cords, MD  Brief narrative: 79 y.o. female with past medical history of diabetes, end-stage renal disease on hemodialysis Tuesday Thursday Saturday, hyperparathyroidism, depression, anemia who presented to the ED with a one to two-day history of productive cough of yellowish sputum, shortness of breath, generalized weakness, and upper airway congestion. Patient denied fever or chills, no nausea, no vomiting, no abdominal pain, no melena, no hematemesis, no hematochezia, no headache, no focal neurological symptoms. Patient denied any lower extremity swelling or pain.   In the ED compressive metabolic profile done had a potassium of 5.8 chloride of 92 BUN of 62 creatinine of 11.99 glucose of 166 and albumin of 2.9. First set of troponin was negative. Pro BNP was 95621. CBC done had a white count of 21.9 hemoglobin of 11.3 and a left shift otherwise was within normal limits. EKG done showed a sinus tachycardia with low voltage. Chest x-ray which was done showed low volume in the chest. Scattered areas of predominantly linear density favored to represent atelectasis. ABG done showed a pH of 7.493, PCO2 of 31, PO2 of 63, bicarbonate of 24.  Pt was initially admitted to the step down unit and transferred to floor on 6/27, and overnight(6/27-28) pt went into afib with RVR and was started on cardizem drip and transferred to 2000unit .she tolerated to normal sinus rhythm and has maintained this.      Assessment/Plan: Atrial Fibrillation, transient -as above, In pt with resolving sepsis, Troponins neg  -She spontaneously converted on the cardizem drip to NSR and remaining in sinus in am of 6/28 - echo was obtained and showed an EF of 55-60, she was transitioned to oral cardizem -discussed pt with Dr Mayford Knife 6/27 regarding ?anticoagulation and she stated that for afib in setting of sepsis,  anticoagulation only recommended if Afib recurs. Dr. Mayford Knife recommends for pt to follow with her oupt for Event Monitor following discharge. -So far A. fib has not recurred  Sepsis, Haemophilus - due to CAP -1/2 blood culture id'ed as haemophilus -completed 7 day abx therapy for this with levaquin and Augmentin, this was stopped yesterday -improved from this standpoint.  Cdiff colitis -continue oral flagyl, clinically improving -WBC trending up, but afebrile and clinically improving -appreciate ID consult per Dr.Campbell likely leukemoid reaction from the C. Difficile -continue florastor  -FU CBC in am  Gram + cocci in 1/2 blood cx Coag neg staph which is likely a contaminant therefore, plan was to dc Zyvox on 6/26- was dc'ed on 6/27  - second bottle growing gram neg cocco-bacillus >> Haemophilus infuenza-  -as above d/c(6/28) Levaquin,  Augmentin discontinued as discussed above.  Hypotension Due to above -  norvasc was dc'ed on 6/25 -Resolved  ESRD on dialysis dialysis Tuesday, Thursdays and Saturdays - Nephrology following  Hyperkalemia Resolved w/ HD, follow and recheck  Diabetes mellitus A1c 5.7 - CBG currently reasonably controlled  Anemia -anemia of chronic disease secondary to end-stage renal disease -aranesp with HD  Obesity  Code Status: FULL Family Communication: No family present at bedside Disposition Plan: to SNF when medically ready, hopefully 1-2days  Consultants: Nephrology PCCM Infectious disease Procedures: ECHO Study Conclusions  - Left ventricle: The cavity size was normal. Systolic function was normal. The estimated ejection fraction was in the range of 55% to 60%. Cannot exclude moderate hypokinesis of the entire myocardium. - Mitral valve: Calcified annulus. Mild to moderate regurgitation. - Left atrium:  The atrium was mildly dilated. - Right ventricle: The cavity size was mildly dilated. Wall thickness was normal. - Pulmonary  arteries: PA peak pressure: 36mm Hg (S).   Antibiotics: Linezolid 6/24>>>6/28  Zosyn 6/24>>6/28 Levaquin 6/25 >>6/28 Flagyl po 6/24 >> Augmentin 6/28>>7/1  DVT prophylaxis: SQ heparin  HPI/Subjective: Feel ok, had 2 loose stools so far today, not as incontinent as before  Objective: Blood pressure 100/36, pulse 75, temperature 98.3 F (36.8 C), temperature source Oral, resp. rate 18, height 5\' 3"  (1.6 m), weight 92.035 kg (202 lb 14.4 oz), SpO2 92.00%.  Intake/Output Summary (Last 24 hours) at 11/08/12 1105 Last data filed at 11/08/12 0700  Gross per 24 hour  Intake    360 ml  Output    615 ml  Net   -255 ml    Exam: General: No acute respiratory distress Lungs: Clear to auscultation bilaterally without wheezes or crackles Cardiovascular: Regular rate and rhythm without murmur gallop or rub normal S1 and S2 Abdomen: Obese, nontender, nondistended, soft, bowel sounds positive, no rebound, no ascites, no appreciable mass Extremities: No cyanosis, clubbing, or edema   Data Reviewed: Basic Metabolic Panel:  Recent Labs Lab 11/04/12 0520 11/05/12 0505 11/06/12 0415 11/07/12 0450 11/08/12 0448  NA 134* 137 136 136 141  K 4.2 4.5 3.7 4.2 3.7  CL 92* 95* 94* 93* 100  CO2 26 30 28 24 29   GLUCOSE 135* 108* 135* 132* 117*  BUN 52* 33* 59* 81* 34*  CREATININE 6.97* 4.41* 6.55* 8.35* 5.14*  CALCIUM 9.2 9.1 9.4 9.8 9.1   Liver Function Tests: No results found for this basename: AST, ALT, ALKPHOS, BILITOT, PROT, ALBUMIN,  in the last 168 hours CBC:  Recent Labs Lab 11/04/12 0520 11/05/12 0505 11/06/12 0415 11/07/12 0450 11/08/12 0448  WBC 21.8* 18.3* 19.3* 27.5* 29.1*  HGB 8.5* 9.1* 8.9* 9.5* 8.7*  HCT 27.3* 29.4* 28.2* 30.5* 27.9*  MCV 76.9* 78.0 76.4* 76.4* 78.8  PLT 281 280 342 383 356   Cardiac Enzymes:  Recent Labs Lab 11/03/12 2341 11/04/12 0520 11/04/12 1128  TROPONINI <0.30 <0.30 <0.30   BNP (last 3 results)  Recent Labs  10/31/12 1220   PROBNP 19230.0*   CBG:  Recent Labs Lab 11/06/12 2141 11/07/12 0557 11/07/12 1634 11/07/12 2118 11/08/12 0613  GLUCAP 180* 131* 144* 176* 114*    Recent Results (from the past 240 hour(s))  CULTURE, BLOOD (ROUTINE X 2)     Status: None   Collection Time    10/31/12 12:40 PM      Result Value Range Status   Specimen Description BLOOD ARM LEFT   Final   Special Requests BOTTLES DRAWN AEROBIC ONLY 5CC   Final   Culture  Setup Time 10/31/2012 17:22   Final   Culture     Final   Value: STAPHYLOCOCCUS SPECIES (COAGULASE NEGATIVE)     Note: THE SIGNIFICANCE OF ISOLATING THIS ORGANISM FROM A SINGLE SET OF BLOOD CULTURES WHEN MULTIPLE SETS ARE DRAWN IS UNCERTAIN. PLEASE NOTIFY THE MICROBIOLOGY DEPARTMENT WITHIN ONE WEEK IF SPECIATION AND SENSITIVITIES ARE REQUIRED.     Note: Gram Stain Report Called to,Read Back By and Verified With: DANIELLE BYERLY 11/01/12 1100 BY SMITHERSJ   Report Status 11/02/2012 FINAL   Final  CULTURE, BLOOD (ROUTINE X 2)     Status: None   Collection Time    10/31/12 12:45 PM      Result Value Range Status   Specimen Description BLOOD HAND LEFT   Final  Special Requests BOTTLES DRAWN AEROBIC ONLY 5CC   Final   Culture  Setup Time 10/31/2012 17:22   Final   Culture     Final   Value: HAEMOPHILUS INFLUENZAE     Note: REPORT FAXED BY REQUEST FAXED TO [facility] Doreene Adas [id number] BY CATAR Referred to [facility] in [place], [place] for Ser[place].     Note: Gram Stain Report Called to,Read Back By and Verified With: [facility] 11/02/12 1115 BY SMITHERSJ   Report Status PENDING   Incomplete  MRSA PCR SCREENING     Status: None   Collection Time    10/31/12  3:33 PM      Result Value Range Status   MRSA by PCR NEGATIVE  NEGATIVE Final   Comment:            The GeneXpert MRSA Assay (FDA     approved for NASAL specimens     only), is one component of a     comprehensive MRSA colonization     surveillance program. It is  not     intended to diagnose MRSA     infection nor to guide or     monitor treatment for     MRSA infections.  CLOSTRIDIUM DIFFICILE BY PCR     Status: Abnormal   Collection Time    10/31/12  3:33 PM      Result Value Range Status   C difficile by pcr POSITIVE (*) NEGATIVE Final   Comment: CRITICAL RESULT CALLED TO, READ BACK BY AND VERIFIED WITH:     D. FIELDS RN 17:10 10/31/12 ([email or web address])  CULTURE, BLOOD (ROUTINE X 2)     Status: None   Collection Time    10/31/12  7:05 PM      Result Value Range Status   Specimen Description BLOOD HEMODIALYSIS GRAFT THIGH   Final   Special Requests BOTTLES DRAWN AEROBIC AND ANAEROBIC 10CC   Final   Culture  Setup Time 11/01/2012 04:07   Final   Culture NO GROWTH 5 DAYS   Final   Report Status 11/07/2012 FINAL   Final  CULTURE, BLOOD (ROUTINE X 2)     Status: None   Collection Time    10/31/12  7:20 PM      Result Value Range Status   Specimen Description BLOOD HEMODIALYSIS GRAFT THIGH   Final   Special Requests BOTTLES DRAWN AEROBIC AND ANAEROBIC 10CC   Final   Culture  Setup Time 11/01/2012 04:08   Final   Culture NO GROWTH 5 DAYS   Final   Report Status 11/07/2012 FINAL   Final     Studies:  I  have reviewed Recent x-ray studies in detail.  Scheduled Meds:  Scheduled Meds: . allopurinol  100 mg Oral Daily  . darbepoetin (ARANESP) injection - DIALYSIS  40 mcg Intravenous Q Thu-HD  . diltiazem  120 mg Oral Daily  . doxercalciferol  3 mcg Intravenous Q T,Th,Sa-HD  . feeding supplement (NEPRO CARB STEADY)  237 mL Oral BID BM  . guaiFENesin  1,200 mg Oral BID  . heparin  5,000 Units Subcutaneous Q8H  . insulin aspart  0-9 Units Subcutaneous TID WC  . loratadine  10 mg Oral Daily  . metroNIDAZOLE  500 mg Oral Q8H  .  morphine injection  1 mg Intravenous Once  . multivitamin  1 tablet Oral QHS  . saccharomyces boulardii  250 mg Oral BID  . sevelamer carbonate  800 mg  Oral TID WC  . sodium chloride  3 mL Intravenous Q12H  .  triamcinolone cream  1 application Topical BID   Time spent on care of this patient:   Zarin Knupp, MD  [phone] Office  [phone] Pager - Text Page per Loretha Stapler as per below:  On-Call/Text Page:      [email or web address]      password [email or web address]  If 7PM-7AM, please contact night-coverage [email or web address] Password [email or web address] 11/08/2012, 11:05 AM   LOS: 8 days

## 2012-11-09 LAB — CBC
Hemoglobin: 8.6 g/dL — ABNORMAL LOW (ref 12.0–15.0)
MCH: 23.6 pg — ABNORMAL LOW (ref 26.0–34.0)
MCHC: 30.2 g/dL (ref 30.0–36.0)
Platelets: 402 10*3/uL — ABNORMAL HIGH (ref 150–400)
RDW: 16.2 % — ABNORMAL HIGH (ref 11.5–15.5)

## 2012-11-09 LAB — BASIC METABOLIC PANEL
Calcium: 9.3 mg/dL (ref 8.4–10.5)
GFR calc Af Amer: 5 mL/min — ABNORMAL LOW (ref >90)
GFR calc non Af Amer: 5 mL/min — ABNORMAL LOW (ref >90)
Potassium: 3.4 mEq/L — ABNORMAL LOW (ref 3.5–5.1)
Sodium: 138 mEq/L (ref 135–145)

## 2012-11-09 MED ORDER — LORATADINE 10 MG PO TABS: 10.0000 mg | ORAL_TABLET | Freq: Every day | ORAL | Status: DC

## 2012-11-09 MED ORDER — NEPRO/CARBSTEADY PO LIQD: 237.0000 mL | Freq: Three times a day (TID) | ORAL | Status: DC | PRN

## 2012-11-09 MED ORDER — METRONIDAZOLE 500 MG PO TABS: 500.0000 mg | ORAL_TABLET | Freq: Three times a day (TID) | ORAL | Status: DC

## 2012-11-09 MED ORDER — DOXERCALCIFEROL 4 MCG/2ML IV SOLN
INTRAVENOUS | Status: AC
  Administered 2012-11-09: 3 ug via INTRAVENOUS
  Filled 2012-11-09: qty 2

## 2012-11-09 MED ORDER — DARBEPOETIN ALFA-POLYSORBATE 60 MCG/0.3ML IJ SOLN
INTRAMUSCULAR | Status: AC
  Administered 2012-11-09: 60 ug via INTRAVENOUS
  Filled 2012-11-09: qty 0.3

## 2012-11-09 MED ORDER — DILTIAZEM HCL ER COATED BEADS 120 MG PO CP24: 120.0000 mg | ORAL_CAPSULE | Freq: Every day | ORAL | Status: DC

## 2012-11-09 NOTE — Progress Notes (Addendum)
[facility] Progress Note    [name] QIO:[id number] DOB: [birth year] DOA: 10/31/2012 PCP: Irena Cords, MD  Brief narrative: 79 y.o. female with past medical history of diabetes, end-stage renal disease on hemodialysis Tuesday Thursday Saturday, hyperparathyroidism, depression, anemia who presented to the ED with a one to two-day history of productive cough of yellowish sputum, shortness of breath, generalized weakness, and upper airway congestion. Patient denied fever or chills, no nausea, no vomiting, no abdominal pain, no melena, no hematemesis, no hematochezia, no headache, no focal neurological symptoms. Pt was initially admitted to the step down unit and transferred to floor on 6/27, and overnight(6/27-28) pt went into afib with RVR and was started on cardizem drip and transferred to 2000unit .she tolerated to normal sinus rhythm and has maintained this. Had sepsis, bacteremia from H.flu, and concomitant Cdiff, has been slowly improving from this standpoint  Assessment/Plan: Atrial Fibrillation, transient -as above, In pt with resolving sepsis, Troponins neg  -She spontaneously converted on the cardizem drip to NSR and remaining in sinus in am of 6/28 - echo was obtained and showed an EF of 55-60, she was transitioned to oral cardizem -discussed pt with Dr Mayford Knife 6/27 regarding ?anticoagulation and she stated that for afib in setting of sepsis, anticoagulation only recommended if Afib recurs. Dr. Mayford Knife recommends for pt to follow with her oupt for Event Monitor following discharge. -So far A. fib has not recurred  Sepsis, Haemophilus - due to CAP -1/2 blood culture: Haemophilus influenza and other bottle with Coag negative staph, c/w contaminant -completed 7 day abx therapy for this with levaquin and Augmentin, this was stopped yesterday -improved from this standpoint.  Cdiff colitis -continue oral flagyl, clinically improving -WBC still up, but afebrile and  clinically improving -appreciate ID consult per Dr.Campbell likely leukemoid reaction from the C. Difficile -continue florastor  -FU CBC in am  Hypotension Due to above -  norvasc was dc'ed on 6/25 -Resolved  ESRD on dialysis dialysis Tuesday, Thursdays and Saturdays - Nephrology following  Hyperkalemia Resolved w/ HD, follow and recheck  Diabetes mellitus A1c 5.7  -stable  Anemia -anemia of chronic disease secondary to end-stage renal disease -aranesp with HD  Obesity  Code Status: FULL Family Communication: No family present at bedside Disposition Plan: to SNF when medically ready, hopefully tomorrow  Consultants: Nephrology PCCM Infectious disease Procedures: ECHO Study Conclusions - Left ventricle: The cavity size was normal. Systolic function was normal. The estimated ejection fraction was in the range of 55% to 60%. Cannot exclude moderate hypokinesis of the entire myocardium. - Mitral valve: Calcified annulus. Mild to moderate regurgitation. - Left atrium: The atrium was mildly dilated. - Right ventricle: The cavity size was mildly dilated. Wall thickness was normal. - Pulmonary arteries: PA peak pressure: 36mm Hg (S).   Antibiotics: Linezolid 6/24>>>6/28  Zosyn 6/24>>6/28 Levaquin 6/25 >>6/28 Flagyl po 6/24 >> Augmentin 6/28>>7/1  DVT prophylaxis: SQ heparin  HPI/Subjective: Feel ok, stool last pm was more formed  Objective: Blood pressure 109/60, pulse 96, temperature 98.5 F (36.9 C), temperature source Oral, resp. rate 23, height 5\' 3"  (1.6 m), weight 95.6 kg (210 lb 12.2 oz), SpO2 92.00%.  Intake/Output Summary (Last 24 hours) at 11/09/12 3244 Last data filed at 11/08/12 2000  Gross per 24 hour  Intake    600 ml  Output      0 ml  Net    600 ml    Exam: General: AAOX3, No distress Lungs: Clear to auscultation bilaterally without wheezes or  crackles Cardiovascular: Regular rate and rhythm without murmur gallop or rub normal S1 and  S2 Abdomen: Obese, nontender, nondistended, soft, bowel sounds positive, no rebound, no ascites, no appreciable mass Extremities: No cyanosis, clubbing, or edema   Data Reviewed: Basic Metabolic Panel:  Recent Labs Lab 11/05/12 0505 11/06/12 0415 11/07/12 0450 11/08/12 0448 11/09/12 0510  NA 137 136 136 141 138  K 4.5 3.7 4.2 3.7 3.4*  CL 95* 94* 93* 100 95*  CO2 30 28 24 29 26   GLUCOSE 108* 135* 132* 117* 108*  BUN 33* 59* 81* 34* 47*  CREATININE 4.41* 6.55* 8.35* 5.14* 7.32*  CALCIUM 9.1 9.4 9.8 9.1 9.3   Liver Function Tests: No results found for this basename: AST, ALT, ALKPHOS, BILITOT, PROT, ALBUMIN,  in the last 168 hours CBC:  Recent Labs Lab 11/05/12 0505 11/06/12 0415 11/07/12 0450 11/08/12 0448 11/09/12 0510  WBC 18.3* 19.3* 27.5* 29.1* 29.5*  HGB 9.1* 8.9* 9.5* 8.7* 8.6*  HCT 29.4* 28.2* 30.5* 27.9* 28.5*  MCV 78.0 76.4* 76.4* 78.8 78.3  PLT 280 342 383 356 402*   Cardiac Enzymes:  Recent Labs Lab 11/03/12 2341 11/04/12 0520 11/04/12 1128  TROPONINI <0.30 <0.30 <0.30   BNP (last 3 results)  Recent Labs  10/31/12 1220  PROBNP 19230.0*   CBG:  Recent Labs Lab 11/08/12 0613 11/08/12 1121 11/08/12 1644 11/08/12 2052 11/09/12 0608  GLUCAP 114* 137* 175* 164* 112*    Recent Results (from the past 240 hour(s))  CULTURE, BLOOD (ROUTINE X 2)     Status: None   Collection Time    10/31/12 12:40 PM      Result Value Range Status   Specimen Description BLOOD ARM LEFT   Final   Special Requests BOTTLES DRAWN AEROBIC ONLY 5CC   Final   Culture  Setup Time 10/31/2012 17:22   Final   Culture     Final   Value: STAPHYLOCOCCUS SPECIES (COAGULASE NEGATIVE)     Note: THE SIGNIFICANCE OF ISOLATING THIS ORGANISM FROM A SINGLE SET OF BLOOD CULTURES WHEN MULTIPLE SETS ARE DRAWN IS UNCERTAIN. PLEASE NOTIFY THE MICROBIOLOGY DEPARTMENT WITHIN ONE WEEK IF SPECIATION AND SENSITIVITIES ARE REQUIRED.     Note: Gram Stain Report Called to,Read Back By and  Verified With: DANIELLE BYERLY 11/01/12 1100 BY SMITHERSJ   Report Status 11/02/2012 FINAL   Final  CULTURE, BLOOD (ROUTINE X 2)     Status: None   Collection Time    10/31/12 12:45 PM      Result Value Range Status   Specimen Description BLOOD HAND LEFT   Final   Special Requests BOTTLES DRAWN AEROBIC ONLY 5CC   Final   Culture  Setup Time 10/31/2012 17:22   Final   Culture     Final   Value: HAEMOPHILUS INFLUENZAE     Note: REPORT FAXED BY REQUEST FAXED TO [facility] Doreene Adas [id number] BY CATAR Referred to [facility] in [place], [place] for Serotyping.     Note: Gram Stain Report Called to,Read Back By and Verified With: [facility] 11/02/12 1115 BY SMITHERSJ   Report Status PENDING   Incomplete  MRSA PCR SCREENING     Status: None   Collection Time    10/31/12  3:33 PM      Result Value Range Status   MRSA by PCR NEGATIVE  NEGATIVE Final   Comment:            The GeneXpert MRSA Assay (FDA  approved for NASAL specimens     only), is one component of a     comprehensive MRSA colonization     surveillance program. It is not     intended to diagnose MRSA     infection nor to guide or     monitor treatment for     MRSA infections.  CLOSTRIDIUM DIFFICILE BY PCR     Status: Abnormal   Collection Time    10/31/12  3:33 PM      Result Value Range Status   C difficile by pcr POSITIVE (*) NEGATIVE Final   Comment: CRITICAL RESULT CALLED TO, READ BACK BY AND VERIFIED WITH:     D. FIELDS RN 17:10 10/31/12 ([email or web address])  CULTURE, BLOOD (ROUTINE X 2)     Status: None   Collection Time    10/31/12  7:05 PM      Result Value Range Status   Specimen Description BLOOD HEMODIALYSIS GRAFT THIGH   Final   Special Requests BOTTLES DRAWN AEROBIC AND ANAEROBIC 10CC   Final   Culture  Setup Time 11/01/2012 04:07   Final   Culture NO GROWTH 5 DAYS   Final   Report Status 11/07/2012 FINAL   Final  CULTURE, BLOOD (ROUTINE X 2)     Status: None   Collection Time     10/31/12  7:20 PM      Result Value Range Status   Specimen Description BLOOD HEMODIALYSIS GRAFT THIGH   Final   Special Requests BOTTLES DRAWN AEROBIC AND ANAEROBIC 10CC   Final   Culture  Setup Time 11/01/2012 04:08   Final   Culture NO GROWTH 5 DAYS   Final   Report Status 11/07/2012 FINAL   Final     Studies:  I  have reviewed Recent x-ray studies in detail.  Scheduled Meds:  Scheduled Meds: . allopurinol  100 mg Oral Daily  . darbepoetin (ARANESP) injection - DIALYSIS  60 mcg Intravenous Q Thu-HD  . diltiazem  120 mg Oral Daily  . doxercalciferol  3 mcg Intravenous Q T,Th,Sa-HD  . feeding supplement (NEPRO CARB STEADY)  237 mL Oral BID BM  . guaiFENesin  1,200 mg Oral BID  . heparin  5,000 Units Subcutaneous Q8H  . insulin aspart  0-9 Units Subcutaneous TID WC  . loratadine  10 mg Oral Daily  . metroNIDAZOLE  500 mg Oral Q8H  .  morphine injection  1 mg Intravenous Once  . multivitamin  1 tablet Oral QHS  . saccharomyces boulardii  250 mg Oral BID  . sevelamer carbonate  800 mg Oral TID WC  . sodium chloride  3 mL Intravenous Q12H  . triamcinolone cream  1 application Topical BID   Time spent on care of this patient:   Romond Pipkins, MD  [phone] Office  [phone] Pager - Text Page per Loretha Stapler as per below:  On-Call/Text Page:      [email or web address]      password [email or web address]  If 7PM-7AM, please contact night-coverage [email or web address] Password [email or web address] 11/09/2012, 9:03 AM   LOS: 9 days

## 2012-11-09 NOTE — Progress Notes (Signed)
Patient ID: [name], female   DOB: Feb 11, 1933, 79 y.o.   MRN: [id number]         [facility]    Date of Admission:  10/31/2012           Day 10 metronidazole  Active Problems:   Leukocytosis   Pneumonia   C. difficile colitis   Sepsis due to Haemophilus influenzae   ESRD on dialysis   Hyperkalemia   Diabetes mellitus   Depression   Anemia   Acute respiratory distress   Chronic diarrhea  Subjective: She continues to feel better. Has not had anymore diarrhea.  Objective: Temp:  [98.3 F (36.8 C)-98.5 F (36.9 C)] 98.5 F (36.9 C) (07/03 0715) Pulse Rate:  [88-102] 100 (07/03 0930) Resp:  [18-23] 23 (07/03 0715) BP: (108-136)/(45-76) 115/56 mmHg (07/03 0930) SpO2:  [92 %-99 %] 92 % (07/03 0715) Weight:  [95.6 kg (210 lb 12.2 oz)-96.3 kg (212 lb 4.9 oz)] 95.6 kg (210 lb 12.2 oz) (07/03 0715)  General: He is currently on dialysis. She appears comfortable Lungs:  clear Cor:  regular S1 and S2 no murmurs Abdomen:  soft and nontender  Lab Results Lab Results  Component Value Date   WBC 29.5* 11/09/2012   HGB 8.6* 11/09/2012   HCT 28.5* 11/09/2012   MCV 78.3 11/09/2012   PLT 402* 11/09/2012    Lab Results  Component Value Date   CREATININE 7.32* 11/09/2012   BUN 47* 11/09/2012   NA 138 11/09/2012   K 3.4* 11/09/2012   CL 95* 11/09/2012   CO2 26 11/09/2012     Assessment: She has leukocytosis but otherwise is much better. I think her Haemophilus influenza bacteremia has been treated effectively and her C. difficile colitis is resolving. Her continue metronidazole for 5 more days.  Plan: 1. Continue metronidazole for 5 more days 2. Please call if we can be of further assistance while she is here  Cliffton Asters, MD [facility] [phone] pager   [phone] cell 11/09/2012, 9:55 AM

## 2012-11-09 NOTE — Progress Notes (Signed)
SNF bed selected at [facility] and per MD will be ready tentatively tomorrow- family and SNF aware and agreeable to this plan. MD to do d/c summary today and CSW will send to SNF to plan ahead to [place] tomorrow-  Reece Levy, MSW [phone]

## 2012-11-09 NOTE — Progress Notes (Signed)
Subjective:  On hd  ,no cos Objective Vital signs in last 24 hours: Filed Vitals:   11/09/12 0715 11/09/12 0728 11/09/12 0745 11/09/12 0800  BP: 127/61 117/56 120/55 126/64  Pulse: 94 89 92 94  Temp: 98.5 F (36.9 C)     TempSrc: Oral     Resp: 23     Height:      Weight: 95.6 kg (210 lb 12.2 oz)     SpO2: 92%      Weight change: 1.9 kg (4 lb 3 oz)  Intake/Output Summary (Last 24 hours) at 11/09/12 0832 Last data filed at 11/08/12 2000  Gross per 24 hour  Intake    600 ml  Output      0 ml  Net    600 ml   Labs: Basic Metabolic Panel:  Recent Labs Lab 11/07/12 0450 11/08/12 0448 11/09/12 0510  NA 136 141 138  K 4.2 3.7 3.4*  CL 93* 100 95*  CO2 24 29 26   GLUCOSE 132* 117* 108*  BUN 81* 34* 47*  CREATININE 8.35* 5.14* 7.32*  CALCIUM 9.8 9.1 9.3   Liver Function Tests: No results found for this basename: AST, ALT, ALKPHOS, BILITOT, PROT, ALBUMIN,  in the last 168 hours No results found for this basename: LIPASE, AMYLASE,  in the last 168 hours No results found for this basename: AMMONIA,  in the last 168 hours CBC:  Recent Labs Lab 11/05/12 0505 11/06/12 0415 11/07/12 0450 11/08/12 0448 11/09/12 0510  WBC 18.3* 19.3* 27.5* 29.1* 29.5*  HGB 9.1* 8.9* 9.5* 8.7* 8.6*  HCT 29.4* 28.2* 30.5* 27.9* 28.5*  MCV 78.0 76.4* 76.4* 78.8 78.3  PLT 280 342 383 356 402*   Cardiac Enzymes:  Recent Labs Lab 11/03/12 2341 11/04/12 0520 11/04/12 1128  TROPONINI <0.30 <0.30 <0.30   CBG:  Recent Labs Lab 11/08/12 0613 11/08/12 1121 11/08/12 1644 11/08/12 2052 11/09/12 0608  GLUCAP 114* 137* 175* 164* 112*    Iron Studies: No results found for this basename: IRON, TIBC, TRANSFERRIN, FERRITIN,  in the last 72 hours Studies/Results: No results found.    Physical Exam:  General: Alert ,NAD, Appropriate BF , on HD Heart: RRR, 1/6 sem lsb, no rub  Lungs: CTA blaterally  Abdomen: BS pos, soft, nontender  Extremities: Dialysis Access: No pedal Edema, right  Femoral avgg  Patent on hd  Dialysis ([facility] TTS)  4hrs 2.25Ca EDW 92kg Heparin 5000 / 2000 R thigh AVGG prof 4  Hectorol 3ug Epogen 4200  Recent labs: Hgb 10.6 6/19 (trending up) and iPTH 325 4/26 418 1/23   Assessment/Plan  1 C Diff- po flagyl 2 Hflu sepsis/PNA- po abx 3 Anemia- aranesp, when dc  EPO 8000 4 Sec HPTH, cont vit D and binder 5 HTN/vol- 3 kg up, UF 2-3 today as tol 6 CKD, cont tts HD 7 SVT/Afib- nsr now, on dilt po 8 Dispo- for SNF possibly Fri   Lenny Pastel, PA-C [facility] [phone] 11/09/2012,8:32 AM  LOS: 9 days   Patient seen and examined.  Agree with assessment and plan as above. Vinson Moselle  MD Pager [phone]    Cell  [phone] 11/09/2012, 11:22 AM

## 2012-11-09 NOTE — Discharge Summary (Addendum)
Physician Discharge Summary  [name]:[id number] DOB: [birth year] DOA: 10/31/2012  PCP: Irena Cords, MD  Admit date: 10/31/2012 Discharge date: 11/09/2012  Time spent: 45 minutes  Recommendations for Outpatient Follow-up:  1. PCP in 1 week 2. CBC in 4 days 3. 3. Dr.Traci Turner, Cardiology in 2 weeks  Discharge Diagnoses:  Acute respiratory distress   Pneumonia   C. difficile colitis   Chronic diarrhea   Sepsis due to Haemophilus influenzae   Community acquired pneumonia   ESRD on dialysis   Hyperkalemia   Diabetes mellitus   Depression   Leukocytosis   Anemia   Leukemoid reaction    Discharge Condition:stable  Diet recommendation:Renal  Filed Weights   11/09/12 0437 11/09/12 0715 11/09/12 1130  Weight: 96.3 kg (212 lb 4.9 oz) 95.6 kg (210 lb 12.2 oz) 93.2 kg (205 lb 7.5 oz)    History of present illness:  [name] is a 79 y.o. female with past medical history of diabetes, end-stage renal disease on hemodialysis Tuesday Thursday Saturday, hyperparathyroidism, depression, anemia who presents to the ED with a one to two-day history of productive cough of yellowish sputum, shortness of breath, generalized weakness, upper airway congestion and cough. Patient denies any fever, no chills, no nausea, no vomiting, no abdominal pain, no melena, no hematemesis, no hematochezia, no headache, no focal neurological symptoms. Patient denies any lower extremity swelling or pain. Patient does endorse decreased oral intake.   Chest x-ray which was done showed  Scattered areas of predominantly linear density favored to represent atelectasis.    Hospital Course:  79 y.o. female with past medical history of diabetes, end-stage renal disease on hemodialysis Tuesday Thursday Saturday, hyperparathyroidism, depression, anemia who presented to the ED with a one to two-day history of productive cough of yellowish sputum, shortness of breath, generalized weakness, and upper airway  congestion. Patient denied fever or chills, no nausea, no vomiting, no abdominal pain, no melena, no hematemesis, no hematochezia, no headache, no focal neurological symptoms.  Pt was initially admitted to the step down unit and transferred to floor on 6/27, and overnight(6/27-28) pt went into afib with RVR and was started on cardizem drip and transferred to 2000unit .she tolerated to normal sinus rhythm and has maintained this.  Had sepsis, bacteremia from H.fluenzae and concomitant Cdiff, has been slowly improving from this standpoint   Assessment/Plan:  Atrial Fibrillation, transient  -as above, In pt with resolving sepsis, Troponins neg  -She spontaneously converted on the cardizem drip to NSR and remaining in sinus in am of 6/28  - echo was obtained and showed an EF of 55-60, she was transitioned to oral cardizem  -discussed pt with Dr Mayford Knife 6/27 regarding ?anticoagulation and she stated that for afib in setting of sepsis, anticoagulation only recommended if Afib recurs. Dr. Mayford Knife recommends for pt to follow with her oupt for Event Monitor following discharge.  -So far A. fib has not recurred   Sepsis, Haemophilus - due to CAP  -1/2 blood culture: Haemophilus influenza and other bottle with Coag negative staph, c/w contaminant  -completed 7 day abx therapy for this with levaquin and Augmentin, this was stopped yesterday  -improved from this standpoint.   Cdiff colitis  -clinically improving  -WBC still up, but afebrile and clinically improving  -appreciate ID consult per Dr.Campbell likely leukemoid reaction from the C. Difficile  -continue florastor  -diarrhea improved and more formed stools now -continue FLagyl for 5 more days -WBC at discharge significantly elevated, but clinically  stable and much improved, per ID this is leukamoid reaction associated with C diff and further intervention not needed since pt clinically better. -WBC 29.5K  Hypotension  Due to above - norvasc was  dc'ed on 6/25  -Resolved   ESRD on dialysis  dialysis Tuesday, Thursdays and Saturdays - Nephrology following   Hyperkalemia  Resolved w/ HD,   Diabetes mellitus  A1c 5.7  -stable   Anemia  -anemia of chronic disease secondary to end-stage renal disease  -aranesp with HD      Consultations:  Renal  ID, Dr.CAmpbell  Discharge Exam: Filed Vitals:   11/09/12 1115 11/09/12 1120 11/09/12 1130 11/09/12 1212  BP: 83/44 113/48 113/59 110/52  Pulse: 89 112 101   Temp:   97.8 F (36.6 C)   TempSrc:   Oral   Resp:   21   Height:      Weight:   93.2 kg (205 lb 7.5 oz)   SpO2:   96%     General: AAOx3 Cardiovascular: S1S2/RRR Respiratory: CTAB  Discharge Instructions  Discharge Orders   Future Appointments Provider Department Dept Phone   12/29/2012 3:00 PM [facility] [phone]   12/29/2012 4:00 PM Fransisco Hertz, MD [facility] [phone]   Future Orders Complete By Expires     Discharge instructions  As directed     Comments:      Renal Diet    Increase activity slowly  As directed         Medication List         allopurinol 100 MG tablet  Commonly known as:  ZYLOPRIM  Take 100 mg by mouth daily.     amLODipine 5 MG tablet  Commonly known as:  NORVASC  Take 5 mg by mouth every evening.     b complex-vitamin c-folic acid 0.8 MG Tabs  Take 0.8 mg by mouth at bedtime.     diltiazem 120 MG 24 hr capsule  Commonly known as:  CARDIZEM CD  Take 1 capsule (120 mg total) by mouth daily.     feeding supplement (NEPRO CARB STEADY) Liqd  Take 237 mLs by mouth 3 (three) times daily as needed (Supplement).     loratadine 10 MG tablet  Commonly known as:  CLARITIN  Take 1 tablet (10 mg total) by mouth daily.     metroNIDAZOLE 500 MG tablet  Commonly known as:  FLAGYL  Take 1 tablet (500 mg total) by mouth every 8 (eight) hours. For 5 days     sevelamer carbonate 800 MG tablet   Commonly known as:  RENVELA  Take 1,600-2,400 mg by mouth 5 (five) times daily. 3 tabs with meals and 2 with snacks     triamcinolone cream 0.1 %  Commonly known as:  KENALOG  Apply 1 application topically 2 (two) times daily.       Allergies  Allergen Reactions  . Codeine Hives  . Vancomycin Rash      The results of significant diagnostics from this hospitalization (including imaging, microbiology, ancillary and laboratory) are listed below for reference.    Significant Diagnostic Studies: Dg Chest Port 1 View  11/02/2012   *RADIOLOGY REPORT*  Clinical Data: Possible pneumonia  PORTABLE CHEST - 1 VIEW  Comparison: Portable chest x-ray of 11/01/2012  Findings: Haziness remains in both lung bases most consistent with atelectasis and possible effusions.  No definite pneumonia is seen although pneumonia at the lung bases cannot  be excluded.  Follow-up two-view chest x-ray may be helpful.  Cardiomegaly is stable.  IMPRESSION: Little change in bibasilar opacities most consistent with atelectasis and effusions.  Consider two-view chest x-ray of possible as follow-up.   Original Report Authenticated By: Dwyane Dee, M.D.   Dg Chest Port 1 View  11/01/2012   *RADIOLOGY REPORT*  Clinical Data: Pneumonia, follow-up  PORTABLE CHEST - 1 VIEW  Comparison: Portable chest x-ray of 10/31/2012  Findings: The lungs are not as well aerated with increase in basilar atelectasis.  Cardiomegaly is present and mild pulmonary vascular congestion cannot be excluded.  IMPRESSION: Diminished aeration with increase in basilar atelectasis.  Cannot exclude pneumonia.  Also mild congestion cannot be excluded.   Original Report Authenticated By: Dwyane Dee, M.D.   Dg Chest Port 1 View  (if Code Sepsis Called)  10/31/2012   *RADIOLOGY REPORT*  Clinical Data: Short of breath for 2 days.  Weakness.  Diabetic patient.  PORTABLE CHEST - 1 VIEW  Comparison: 11/08/2008.  Findings: Low volume chest.  Areas of subsegmental  atelectasis are present superimposed on scarring.  Density superior to the right hemidiaphragm is favored to represent subsegmental atelectasis, seen obliquely.  The cardiopericardial silhouette is within normal limits allowing for low volumes.  No definite pleural effusion. No focal areas of consolidation.  IMPRESSION: Low volume chest.  Scattered areas of predominately linear density is favored to represent atelectasis based on lung volumes. Consider repeat PA and lateral chest with full inspiration when patient condition permits.   Original Report Authenticated By: Andreas Newport, M.D.    Microbiology: Recent Results (from the past 240 hour(s))  CULTURE, BLOOD (ROUTINE X 2)     Status: None   Collection Time    10/31/12 12:40 PM      Result Value Range Status   Specimen Description BLOOD ARM LEFT   Final   Special Requests BOTTLES DRAWN AEROBIC ONLY 5CC   Final   Culture  Setup Time 10/31/2012 17:22   Final   Culture     Final   Value: STAPHYLOCOCCUS SPECIES (COAGULASE NEGATIVE)     Note: THE SIGNIFICANCE OF ISOLATING THIS ORGANISM FROM A SINGLE SET OF BLOOD CULTURES WHEN MULTIPLE SETS ARE DRAWN IS UNCERTAIN. PLEASE NOTIFY THE MICROBIOLOGY DEPARTMENT WITHIN ONE WEEK IF SPECIATION AND SENSITIVITIES ARE REQUIRED.     Note: Gram Stain Report Called to,Read Back By and Verified With: DANIELLE BYERLY 11/01/12 1100 BY SMITHERSJ   Report Status 11/02/2012 FINAL   Final  CULTURE, BLOOD (ROUTINE X 2)     Status: None   Collection Time    10/31/12 12:45 PM      Result Value Range Status   Specimen Description BLOOD HAND LEFT   Final   Special Requests BOTTLES DRAWN AEROBIC ONLY 5CC   Final   Culture  Setup Time 10/31/2012 17:22   Final   Culture     Final   Value: HAEMOPHILUS INFLUENZAE     Note: REPORT FAXED BY REQUEST FAXED TO [facility] Doreene Adas [id number] BY CATAR Referred to [facility] in [place], [place] Ser[place].     Note: Gram Stain Report Called  to,Read Back By and Verified With: [facility] 11/02/12 1115 BY SMITHERSJ   Report Status PENDING   Incomplete  MRSA PCR SCREENING     Status: None   Collection Time    10/31/12  3:33 PM      Result Value Range Status   MRSA by PCR  NEGATIVE  NEGATIVE Final   Comment:            The GeneXpert MRSA Assay (FDA     approved for NASAL specimens     only), is one component of a     comprehensive MRSA colonization     surveillance program. It is not     intended to diagnose MRSA     infection nor to guide or     monitor treatment for     MRSA infections.  CLOSTRIDIUM DIFFICILE BY PCR     Status: Abnormal   Collection Time    10/31/12  3:33 PM      Result Value Range Status   C difficile by pcr POSITIVE (*) NEGATIVE Final   Comment: CRITICAL RESULT CALLED TO, READ BACK BY AND VERIFIED WITH:     D. FIELDS RN 17:10 10/31/12 ([email or web address])  CULTURE, BLOOD (ROUTINE X 2)     Status: None   Collection Time    10/31/12  7:05 PM      Result Value Range Status   Specimen Description BLOOD HEMODIALYSIS GRAFT THIGH   Final   Special Requests BOTTLES DRAWN AEROBIC AND ANAEROBIC 10CC   Final   Culture  Setup Time 11/01/2012 04:07   Final   Culture NO GROWTH 5 DAYS   Final   Report Status 11/07/2012 FINAL   Final  CULTURE, BLOOD (ROUTINE X 2)     Status: None   Collection Time    10/31/12  7:20 PM      Result Value Range Status   Specimen Description BLOOD HEMODIALYSIS GRAFT THIGH   Final   Special Requests BOTTLES DRAWN AEROBIC AND ANAEROBIC 10CC   Final   Culture  Setup Time 11/01/2012 04:08   Final   Culture NO GROWTH 5 DAYS   Final   Report Status 11/07/2012 FINAL   Final     Labs: Basic Metabolic Panel:  Recent Labs Lab 11/05/12 0505 11/06/12 0415 11/07/12 0450 11/08/12 0448 11/09/12 0510  NA 137 136 136 141 138  K 4.5 3.7 4.2 3.7 3.4*  CL 95* 94* 93* 100 95*  CO2 30 28 24 29 26   GLUCOSE 108* 135* 132* 117* 108*  BUN 33* 59* 81* 34* 47*  CREATININE 4.41* 6.55* 8.35* 5.14*  7.32*  CALCIUM 9.1 9.4 9.8 9.1 9.3   Liver Function Tests: No results found for this basename: AST, ALT, ALKPHOS, BILITOT, PROT, ALBUMIN,  in the last 168 hours No results found for this basename: LIPASE, AMYLASE,  in the last 168 hours No results found for this basename: AMMONIA,  in the last 168 hours CBC:  Recent Labs Lab 11/05/12 0505 11/06/12 0415 11/07/12 0450 11/08/12 0448 11/09/12 0510  WBC 18.3* 19.3* 27.5* 29.1* 29.5*  HGB 9.1* 8.9* 9.5* 8.7* 8.6*  HCT 29.4* 28.2* 30.5* 27.9* 28.5*  MCV 78.0 76.4* 76.4* 78.8 78.3  PLT 280 342 383 356 402*   Cardiac Enzymes:  Recent Labs Lab 11/03/12 2341 11/04/12 0520 11/04/12 1128  TROPONINI <0.30 <0.30 <0.30   BNP: BNP (last 3 results)  Recent Labs  10/31/12 1220  PROBNP 19230.0*   CBG:  Recent Labs Lab 11/08/12 1121 11/08/12 1644 11/08/12 2052 11/09/12 0608 11/09/12 1215  GLUCAP 137* 175* 164* 112* 88       Signed:  Elia Keenum  [facility] 11/09/2012, 3:46 PM

## 2012-11-10 DIAGNOSIS — R5381 Other malaise: Secondary | ICD-10-CM | POA: Diagnosis not present

## 2012-11-10 DIAGNOSIS — Z418 Encounter for other procedures for purposes other than remedying health state: Secondary | ICD-10-CM | POA: Diagnosis not present

## 2012-11-10 DIAGNOSIS — D631 Anemia in chronic kidney disease: Secondary | ICD-10-CM | POA: Diagnosis not present

## 2012-11-10 DIAGNOSIS — N2581 Secondary hyperparathyroidism of renal origin: Secondary | ICD-10-CM | POA: Diagnosis not present

## 2012-11-10 DIAGNOSIS — I120 Hypertensive chronic kidney disease with stage 5 chronic kidney disease or end stage renal disease: Secondary | ICD-10-CM | POA: Diagnosis not present

## 2012-11-10 DIAGNOSIS — N186 End stage renal disease: Secondary | ICD-10-CM | POA: Diagnosis not present

## 2012-11-10 DIAGNOSIS — Z8701 Personal history of pneumonia (recurrent): Secondary | ICD-10-CM | POA: Diagnosis not present

## 2012-11-10 DIAGNOSIS — Z5189 Encounter for other specified aftercare: Secondary | ICD-10-CM | POA: Diagnosis not present

## 2012-11-10 DIAGNOSIS — E1129 Type 2 diabetes mellitus with other diabetic kidney complication: Secondary | ICD-10-CM | POA: Diagnosis not present

## 2012-11-10 DIAGNOSIS — Z992 Dependence on renal dialysis: Secondary | ICD-10-CM | POA: Diagnosis not present

## 2012-11-10 DIAGNOSIS — E213 Hyperparathyroidism, unspecified: Secondary | ICD-10-CM | POA: Diagnosis not present

## 2012-11-10 DIAGNOSIS — J189 Pneumonia, unspecified organism: Secondary | ICD-10-CM | POA: Diagnosis not present

## 2012-11-10 DIAGNOSIS — M109 Gout, unspecified: Secondary | ICD-10-CM | POA: Diagnosis not present

## 2012-11-10 DIAGNOSIS — I959 Hypotension, unspecified: Secondary | ICD-10-CM | POA: Diagnosis not present

## 2012-11-10 DIAGNOSIS — E119 Type 2 diabetes mellitus without complications: Secondary | ICD-10-CM | POA: Diagnosis not present

## 2012-11-10 DIAGNOSIS — D72823 Leukemoid reaction: Secondary | ICD-10-CM | POA: Diagnosis not present

## 2012-11-10 DIAGNOSIS — M6281 Muscle weakness (generalized): Secondary | ICD-10-CM | POA: Diagnosis not present

## 2012-11-10 DIAGNOSIS — D649 Anemia, unspecified: Secondary | ICD-10-CM | POA: Diagnosis not present

## 2012-11-10 DIAGNOSIS — F329 Major depressive disorder, single episode, unspecified: Secondary | ICD-10-CM | POA: Diagnosis not present

## 2012-11-10 DIAGNOSIS — A0472 Enterocolitis due to Clostridium difficile, not specified as recurrent: Secondary | ICD-10-CM | POA: Diagnosis not present

## 2012-11-10 LAB — GLUCOSE, CAPILLARY

## 2012-11-10 LAB — CBC
HCT: 28.4 % — ABNORMAL LOW (ref 36.0–46.0)
Hemoglobin: 8.8 g/dL — ABNORMAL LOW (ref 12.0–15.0)
MCH: 24.9 pg — ABNORMAL LOW (ref 26.0–34.0)
MCHC: 31 g/dL (ref 30.0–36.0)

## 2012-11-10 NOTE — Progress Notes (Signed)
Assessment unchanged. IV and tele removed. Pt clothed in paper gown for transport to facility. Packet with D/C information given to PTAR including D/C summary. Report called to receiving nurse at [facility]. Pt left via EMS.

## 2012-11-10 NOTE — Clinical Social Work Note (Signed)
Clinical Social Worker facilitated discharge by contacting facility, [facility], leaving voice message for niece and speaking with patient regarding discharge today. Patient reported that her family is currently working and will need a non-emergent ambulance ride to the facility. CSW will complete discharge packet and place in [facility]. CSW will sign off, as social work intervention is no longer needed.   [name] MSW, [employer] [phone]

## 2012-11-10 NOTE — Progress Notes (Signed)
Subjective:  Up in chair, I'm going home today  Objective Vital signs in last 24 hours: Filed Vitals:   11/09/12 1615 11/09/12 2329 11/10/12 0550 11/10/12 1018  BP: 94/41 110/33 102/41 115/49  Pulse: 96 93 101   Temp: 98.6 F (37 C) 98.6 F (37 C) 98.4 F (36.9 C)   TempSrc: Oral Oral Oral   Resp: 20 18 18    Height:      Weight:   93.2 kg (205 lb 7.5 oz)   SpO2: 94% 96% 94%    Weight change: -0.7 kg (-1 lb 8.7 oz)  Intake/Output Summary (Last 24 hours) at 11/10/12 1103 Last data filed at 11/10/12 0823  Gross per 24 hour  Intake    120 ml  Output   1900 ml  Net  -1780 ml   Labs: Basic Metabolic Panel:  Recent Labs Lab 11/07/12 0450 11/08/12 0448 11/09/12 0510  NA 136 141 138  K 4.2 3.7 3.4*  CL 93* 100 95*  CO2 24 29 26   GLUCOSE 132* 117* 108*  BUN 81* 34* 47*  CREATININE 8.35* 5.14* 7.32*  CALCIUM 9.8 9.1 9.3   Liver Function Tests: No results found for this basename: AST, ALT, ALKPHOS, BILITOT, PROT, ALBUMIN,  in the last 168 hours No results found for this basename: LIPASE, AMYLASE,  in the last 168 hours No results found for this basename: AMMONIA,  in the last 168 hours CBC:  Recent Labs Lab 11/06/12 0415 11/07/12 0450 11/08/12 0448 11/09/12 0510 11/10/12 0545  WBC 19.3* 27.5* 29.1* 29.5* 32.3*  HGB 8.9* 9.5* 8.7* 8.6* 8.8*  HCT 28.2* 30.5* 27.9* 28.5* 28.4*  MCV 76.4* 76.4* 78.8 78.3 80.2  PLT 342 383 356 402* 298   Cardiac Enzymes:  Recent Labs Lab 11/03/12 2341 11/04/12 0520 11/04/12 1128  TROPONINI <0.30 <0.30 <0.30   CBG:  Recent Labs Lab 11/09/12 0608 11/09/12 1215 11/09/12 1653 11/09/12 2011 11/10/12 0645  GLUCAP 112* 88 126* 183* 125*    Iron Studies: No results found for this basename: IRON, TIBC, TRANSFERRIN, FERRITIN,  in the last 72 hours Studies/Results: No results found.    Physical Exam:  General: Alert ,NAD, Appropriate BF , on HD Heart: RRR, 1/6 sem lsb, no rub  Lungs: CTA blaterally  Abdomen: BS pos,  soft, nontender  Extremities: Dialysis Access: No pedal Edema, right Femoral avgg  Patent on hd  Dialysis ([facility] TTS)  4hrs 2.25Ca EDW 92kg Heparin 5000 / 2000 R thigh AVGG prof 4  Hectorol 3ug Epogen 4200  Recent labs: Hgb 10.6 6/19 (trending up) and iPTH 325 4/26 418 1/23   Assessment/Plan  1 C Diff- po flagyl 2 Hflu sepsis/PNA- po abx 3 Anemia- aranesp, when dc  EPO 8000 4 Sec HPTH, cont vit D and binder 5 HTN/vol- 3 kg up, UF 2-3 today as tol 6 CKD, cont tts HD 7 SVT/Afib- nsr now, on dilt po 8 Dispo- for d/c to snf today  Vinson Moselle  MD Pager [phone]    Cell  [phone] 11/10/2012, 11:03 AM

## 2012-11-11 DIAGNOSIS — N039 Chronic nephritic syndrome with unspecified morphologic changes: Secondary | ICD-10-CM | POA: Diagnosis not present

## 2012-11-11 DIAGNOSIS — N2581 Secondary hyperparathyroidism of renal origin: Secondary | ICD-10-CM | POA: Diagnosis not present

## 2012-11-11 DIAGNOSIS — N186 End stage renal disease: Secondary | ICD-10-CM | POA: Diagnosis not present

## 2012-11-11 DIAGNOSIS — D631 Anemia in chronic kidney disease: Secondary | ICD-10-CM | POA: Diagnosis not present

## 2012-11-14 ENCOUNTER — Encounter: Payer: Self-pay | Admitting: Nurse Practitioner

## 2012-11-14 ENCOUNTER — Non-Acute Institutional Stay (SKILLED_NURSING_FACILITY): Payer: Medicare Other | Admitting: Nurse Practitioner

## 2012-11-14 DIAGNOSIS — A0472 Enterocolitis due to Clostridium difficile, not specified as recurrent: Secondary | ICD-10-CM

## 2012-11-14 DIAGNOSIS — D649 Anemia, unspecified: Secondary | ICD-10-CM

## 2012-11-14 DIAGNOSIS — E119 Type 2 diabetes mellitus without complications: Secondary | ICD-10-CM | POA: Diagnosis not present

## 2012-11-14 DIAGNOSIS — N039 Chronic nephritic syndrome with unspecified morphologic changes: Secondary | ICD-10-CM | POA: Diagnosis not present

## 2012-11-14 DIAGNOSIS — N186 End stage renal disease: Secondary | ICD-10-CM

## 2012-11-14 DIAGNOSIS — A413 Sepsis due to Hemophilus influenzae: Secondary | ICD-10-CM

## 2012-11-14 DIAGNOSIS — N2581 Secondary hyperparathyroidism of renal origin: Secondary | ICD-10-CM | POA: Diagnosis not present

## 2012-11-14 DIAGNOSIS — A419 Sepsis, unspecified organism: Secondary | ICD-10-CM

## 2012-11-14 NOTE — Progress Notes (Signed)
Patient ID: [name], female   DOB: Apr 13, 1933, 79 y.o.   MRN: [id number]   PCP: Irena Cords, MD   Allergies  Allergen Reactions  . Codeine Hives  . Vancomycin Rash    Chief Complaint: hospital follow up  HPI:  79 y.o. female with past medical history of diabetes, end-stage renal disease on hemodialysis, hyperparathyroidism, depression, anemia who went to the ED 10/31/2012, with a history of productive cough of yellowish sputum, shortness of breath, generalized weakness, upper airway congestion and cough. Chest x-ray which was done showed Scattered areas of predominantly linear density favored to represent atelectasis. Pt was initially admitted to the step down unit and transferred to floor on 6/27, and overnight(6/27-28) pt went into afib with RVR and was started on cardizem drip. she tolerated to normal sinus rhythm and has maintained this, was restarted back on POs. Pt also with sepsis, bacteremia from H.fluenzae and concomitant Cdiff, has been slowly improving with generalized weakness discharged 11/10/2012 to [facility] for strength training before she returns home with son.   Review of Systems:  Review of Systems  Constitutional: Negative for fever and chills.  Cardiovascular: Negative for chest pain and palpitations.  Gastrointestinal: Positive for diarrhea. Negative for heartburn, nausea, abdominal pain and constipation.  Genitourinary: Negative for dysuria, urgency and frequency.  Musculoskeletal: Negative for myalgias, joint pain and falls.  Neurological: Positive for weakness (generalized weakness). Negative for dizziness and headaches.  Psychiatric/Behavioral: Negative for depression. The patient is not nervous/anxious and does not have insomnia.      Past Medical History  Diagnosis Date  . Diabetes mellitus   . Anemia   . Hyperparathyroidism   . Depression   . Obesity   . Osteoarthritis   . MRSA (methicillin resistant staph aureus) culture positive   .  Gout   . C. difficile diarrhea   . Chronic kidney disease   . Diabetes mellitus 10/31/2012   Past Surgical History  Procedure Laterality Date  . Breast biopsy    . Shuntogram Right 09/06/12    Thigh graft  . Sp declot avgg Right Sep 14, 2012    Right thigh graft   Social History:   reports that she has never smoked. She has never used smokeless tobacco. She reports that she does not drink alcohol or use illicit drugs.  Family History  Problem Relation Age of Onset  . Cancer Father   . Diabetes Sister     Medications: Patient's Medications  New Prescriptions   No medications on file  Previous Medications   ALLOPURINOL (ZYLOPRIM) 100 MG TABLET    Take 100 mg by mouth daily.     AMLODIPINE (NORVASC) 5 MG TABLET    Take 5 mg by mouth every evening.   B COMPLEX-VITAMIN C-FOLIC ACID (NEPHRO-VITE) 0.8 MG TABS    Take 0.8 mg by mouth at bedtime.     DILTIAZEM (CARDIZEM CD) 120 MG 24 HR CAPSULE    Take 1 capsule (120 mg total) by mouth daily.   LORATADINE (CLARITIN) 10 MG TABLET    Take 1 tablet (10 mg total) by mouth daily.   METRONIDAZOLE (FLAGYL) 500 MG TABLET    Take 1 tablet (500 mg total) by mouth every 8 (eight) hours. For 5 days   NUTRITIONAL SUPPLEMENTS (FEEDING SUPPLEMENT, NEPRO CARB STEADY,) LIQD    Take 237 mLs by mouth 3 (three) times daily as needed (Supplement).   SEVELAMER CARBONATE (RENVELA) 800 MG TABLET    Take 1,600-2,400 mg by mouth  5 (five) times daily. 3 tabs with meals and 2 with snacks   TRIAMCINOLONE (KENALOG) 0.1 % CREAM    Apply 1 application topically 2 (two) times daily.    Modified Medications   No medications on file  Discontinued Medications   No medications on file     Physical Exam:  Filed Vitals:   11/14/12 1601  BP: 102/63  Pulse: 100  Temp: 97.9 F (36.6 C)  Resp: 20   Physical Exam  Nursing note and vitals reviewed. Constitutional: She is oriented to person, place, and time. She appears well-developed and well-nourished. No distress.   HENT:  Head: Normocephalic and atraumatic.  Nose: Nose normal.  Mouth/Throat: Oropharynx is clear and moist. No oropharyngeal exudate.  Eyes: Conjunctivae and EOM are normal. Pupils are equal, round, and reactive to light.  Neck: Normal range of motion. Neck supple. No JVD present. No thyromegaly present.  Cardiovascular: Normal rate, regular rhythm and normal heart sounds.   Pulmonary/Chest: Effort normal and breath sounds normal.  Abdominal: Soft. Bowel sounds are normal. She exhibits no distension. There is no tenderness.  Musculoskeletal: Normal range of motion. She exhibits no edema and no tenderness.  Lymphadenopathy:    She has no cervical adenopathy.  Neurological: She is alert and oriented to person, place, and time.  Skin: Skin is warm and dry. She is not diaphoretic.  Psychiatric: She has a normal mood and affect.      Labs reviewed: Basic Metabolic Panel:  Recent Labs  21/30/86 1706 11/01/12 0435  11/07/12 0450 11/08/12 0448 11/09/12 0510  NA 136 136  < > 136 141 138  K 5.8* 4.3  < > 4.2 3.7 3.4*  CL 92* 94*  < > 93* 100 95*  CO2 22 31  < > 24 29 26   GLUCOSE 206* 164*  < > 132* 117* 108*  BUN 68* 26*  < > 81* 34* 47*  CREATININE 13.09* 6.22*  < > 8.35* 5.14* 7.32*  CALCIUM 9.0 8.9  < > 9.8 9.1 9.3  MG 2.2  --   --   --   --   --   PHOS  --  6.5*  --   --   --   --   < > = values in this interval not displayed. Liver Function Tests:  Recent Labs  10/31/12 1220  AST 14  ALT 8  ALKPHOS 91  BILITOT 0.9  PROT 7.5  ALBUMIN 2.9*   No results found for this basename: LIPASE, AMYLASE,  in the last 8760 hours No results found for this basename: AMMONIA,  in the last 8760 hours CBC:  Recent Labs  10/31/12 1220  11/01/12 0435  11/08/12 0448 11/09/12 0510 11/10/12 0545  WBC 21.9*  < > 17.3*  < > 29.1* 29.5* 32.3*  NEUTROABS 19.6*  --  15.0*  --   --   --   --   HGB 11.3*  < > 9.2*  < > 8.7* 8.6* 8.8*  HCT 35.6*  < > 29.3*  < > 27.9* 28.5* 28.4*  MCV  77.1*  < > 76.7*  < > 78.8 78.3 80.2  PLT 225  < > 202  < > 356 402* 298  < > = values in this interval not displayed. Cardiac Enzymes:  Recent Labs  11/03/12 2341 11/04/12 0520 11/04/12 1128  TROPONINI <0.30 <0.30 <0.30   BNP: No components found with this basename: POCBNP,  CBG:  Recent Labs  11/09/12 1653 11/09/12 2011  11/10/12 0645  GLUCAP 126* 183* 125*     Procedures:  11/02/2012 PORTABLE CHEST - 1 VIEW Comparison: Portable chest x-ray of 11/01/2012 Findings: Haziness remains in both lung bases most consistent with atelectasis and possible effusions. No definite pneumonia is seen although pneumonia at the lung bases cannot be excluded. Follow-up two-view chest x-ray may be helpful. Cardiomegaly is stable. IMPRESSION: Little change in bibasilar opacities most consistent with atelectasis and effusions. Consider two-view chest x-ray of possible as follow-up. Original Report Authenticated By: Dwyane Dee, M.D.  11/01/2012 PORTABLE CHEST - 1 VIEW Comparison: Portable chest x-ray of 10/31/2012 Findings: The lungs are not as well aerated with increase in basilar atelectasis. Cardiomegaly is present and mild pulmonary vascular congestion cannot be excluded. IMPRESSION: Diminished aeration with increase in basilar atelectasis. Cannot exclude pneumonia. Also mild congestion cannot be excluded. Original Report Authenticated By: Dwyane Dee, M.D.  10/31/2012 .PORTABLE CHEST - 1 VIEW Comparison: 11/08/2008. Findings: Low volume chest. Areas of subsegmental atelectasis are present superimposed on scarring. Density superior to the right hemidiaphragm is favored to represent subsegmental atelectasis, seen obliquely. The cardiopericardial silhouette is within normal limits allowing for low volumes. No definite pleural effusion. No focal areas of consolidation. IMPRESSION: Low volume chest. Scattered areas of predominately linear density is favored to represent atelectasis based on lung volumes. Consider  repeat PA and lateral chest with full inspiration when patient condition permits. Original Report Authenticated By: Andreas Newport, M.D.    Consultants:  Dr. Armanda Magic Cardiology  Dr. Terrial Rhodes PCP  Dr. Primitivo Gauze Nephrology   Assessment/Plan  Atrial Fibrillation, transient - stable- back in sinus at this time.  Sepsis, Haemophilus - due to CAP - improved- completes levaquin and augmentin in hospital - will follow up cbc  Cdiff colitis - stable conts to flagyl; diarrhea improving  ESRD on dialysis- conts dialysis Tuesday, Thursdays and Saturdays- follows with Nephrology   Diabetes mellitus- last A1c 5.7  Anemia -anemia of chronic disease secondary to end-stage renal disease and gets aranesp with HD

## 2012-11-16 DIAGNOSIS — N2581 Secondary hyperparathyroidism of renal origin: Secondary | ICD-10-CM | POA: Diagnosis not present

## 2012-11-16 DIAGNOSIS — D631 Anemia in chronic kidney disease: Secondary | ICD-10-CM | POA: Diagnosis not present

## 2012-11-16 DIAGNOSIS — N186 End stage renal disease: Secondary | ICD-10-CM | POA: Diagnosis not present

## 2012-11-17 NOTE — Progress Notes (Signed)
Date: 11/17/2012  MRN:  [id number] Name:  [name] Sex:  female Age:  79 y.o. DOB:04-15-1933                        Facility/Room; [facility] Level Of Care:SNF Provider: Dr. Murray Hodgkins  Emergency Contacts: Contact Information   Name Relation Home Work Mobile   Harris,Myrtle Niece [phone]     [place] [phone]  [phone]      Code Status:Full MOST Form:  Allergies: Allergies  Allergen Reactions  . Codeine Hives  . Vancomycin Rash     Chief Complaint  Patient presents with  . Medical Managment of Chronic Issues    New admit to SNF following hospitalziation for acute respiratory distress     HPI:  79 y.o. female with past medical history of diabetes, end-stage renal disease on hemodialysis Tuesday Thursday Saturday, hyperparathyroidism, depression, anemia who presents to the ED 10/31/2012,  with a one to two-day history of productive cough of yellowish sputum, shortness of breath, generalized weakness, upper airway congestion and cough. Patient denies any fever, no chills, no nausea, no vomiting, no abdominal pain, no melena, no hematemesis, no hematochezia, no headache, no focal neurological symptoms. Patient denies any lower extremity swelling or pain. Patient does endorse decreased oral intake.  Chest x-ray which was done showed Scattered areas of predominantly linear density favored to represent atelectasis. Pt was initially admitted to the step down unit and transferred to floor on 6/27, and overnight(6/27-28) pt went into afib with RVR and was started on cardizem drip and transferred to 2000unit .she tolerated to normal sinus rhythm and has maintained this.  Had sepsis, bacteremia from H.fluenzae and concomitant Cdiff, has been slowly improving from this standpoint discharged 11/10/2012 to [facility].     Past Medical History  Diagnosis Date  . Diabetes mellitus   . Anemia   . Hyperparathyroidism   . Depression   . Obesity   .  Osteoarthritis   . MRSA (methicillin resistant staph aureus) culture positive   . Gout   . C. difficile diarrhea   . Chronic kidney disease   . Diabetes mellitus 10/31/2012    Past Surgical History  Procedure Laterality Date  . Breast biopsy    . Shuntogram Right 09/06/12    Thigh graft  . Sp declot avgg Right Sep 14, 2012    Right thigh graft     Procedures: 11/02/2012  PORTABLE CHEST - 1 VIEW Comparison: Portable chest x-ray of 11/01/2012 Findings: Haziness remains in both lung bases most consistent with atelectasis and possible effusions. No definite pneumonia is seen although pneumonia at the lung bases cannot be excluded. Follow-up two-view chest x-ray may be helpful. Cardiomegaly is stable. IMPRESSION: Little change in bibasilar opacities most consistent with atelectasis and effusions. Consider two-view chest x-ray of possible as follow-up. Original Report Authenticated By: Dwyane Dee, M.D.   11/01/2012  PORTABLE CHEST - 1 VIEW Comparison: Portable chest x-ray of 10/31/2012 Findings: The lungs are not as well aerated with increase in basilar atelectasis. Cardiomegaly is present and mild pulmonary vascular congestion cannot be excluded. IMPRESSION: Diminished aeration with increase in basilar atelectasis. Cannot exclude pneumonia. Also mild congestion cannot be excluded. Original Report Authenticated By: Dwyane Dee, M.D.   10/31/2012 .PORTABLE CHEST - 1 VIEW Comparison: 11/08/2008. Findings: Low volume chest. Areas of subsegmental atelectasis are present superimposed on scarring. Density superior to the right hemidiaphragm is favored to represent subsegmental atelectasis, seen obliquely. The cardiopericardial silhouette  is within normal limits allowing for low volumes. No definite pleural effusion. No focal areas of consolidation. IMPRESSION: Low volume chest. Scattered areas of predominately linear density is favored to represent atelectasis based on lung volumes. Consider repeat PA and lateral  chest with full inspiration when patient condition permits. Original Report Authenticated By: Andreas Newport, M.D.     Consultants:  Dr. Armanda Magic Cardiology  Dr. Terrial Rhodes PCP  Dr. Primitivo Gauze Nephrology   Current Outpatient Prescriptions  Medication Sig Dispense Refill  . allopurinol (ZYLOPRIM) 100 MG tablet Take 100 mg by mouth daily.        Marland Kitchen amLODipine (NORVASC) 5 MG tablet Take 5 mg by mouth every evening.      Marland Kitchen b complex-vitamin c-folic acid (NEPHRO-VITE) 0.8 MG TABS Take 0.8 mg by mouth at bedtime.        Marland Kitchen diltiazem (CARDIZEM CD) 120 MG 24 hr capsule Take 1 capsule (120 mg total) by mouth daily.  30 capsule  0  . loratadine (CLARITIN) 10 MG tablet Take 1 tablet (10 mg total) by mouth daily.      . metroNIDAZOLE (FLAGYL) 500 MG tablet Take 1 tablet (500 mg total) by mouth every 8 (eight) hours. For 5 days  15 tablet  0  . Nutritional Supplements (FEEDING SUPPLEMENT, NEPRO CARB STEADY,) LIQD Take 237 mLs by mouth 3 (three) times daily as needed (Supplement).    0  . sevelamer carbonate (RENVELA) 800 MG tablet Take 1,600-2,400 mg by mouth 5 (five) times daily. 3 tabs with meals and 2 with snacks      . triamcinolone (KENALOG) 0.1 % cream Apply 1 application topically 2 (two) times daily.         No current facility-administered medications for this visit.     There is no immunization history on file for this patient.   Diet:  History  Substance Use Topics  . Smoking status: Never Smoker   . Smokeless tobacco: Never Used  . Alcohol Use: No    Family History  Problem Relation Age of Onset  . Cancer Father   . Diabetes Sister        Vital signs: BP 93/54  Pulse 100  Resp 22  Ht 5\' 3"  (1.6 m)  Wt 202 lb 6.4 oz (91.808 kg)  BMI 35.86 kg/m2   General Appearance:    Alert, cooperative, no distress, appears stated age  Head:    Normocephalic, without obvious abnormality, atraumatic  Eyes:    PERRL, conjunctiva/corneas clear, EOM's intact, fundi     benign, both eyes  Ears:    Normal TM's and external ear canals, both ears  Nose:   Nares normal, septum midline, mucosa normal, no drainage    or sinus tenderness  Throat:   Lips, mucosa, and tongue normal; teeth and gums normal  Neck:   Supple, symmetrical, trachea midline, no adenopathy;    thyroid:  no enlargement/tenderness/nodules; no carotid   bruit or JVD  Back:     Symmetric, no curvature, ROM normal, no CVA tenderness  Lungs:     Clear to auscultation bilaterally, respirations unlabored  Chest Wall:    No tenderness or deformity   Heart:    Regular rate and rhythm, S1 and S2 normal, no murmur, rub   or gallop  Breast Exam:    No tenderness, masses, or nipple abnormality  Abdomen:     Soft, non-tender, bowel sounds active all four quadrants,    no masses, no organomegaly  Genitalia:    Normal female without lesion, discharge or tenderness  Rectal:    Normal tone, normal prostate, no masses or tenderness;   guaiac negative stool  Extremities:   Extremities normal, atraumatic, no cyanosis or edema  Pulses:   2+ and symmetric all extremities  Skin:   Skin color, texture, turgor normal, no rashes or lesions  Lymph nodes:   Cervical, supraclavicular, and axillary nodes normal  Neurologic:   CNII-XII intact, normal strength, sensation and reflexes    throughout    Screening Score  MMS    PHQ2    PHQ9     Fall Risk    BIMS     Admission on 10/31/2012, Discharged on 11/10/2012  No results displayed because visit has over 200 results.       Annual summary: Hospitalizations: Infection History:  Functional assessment: Areas of potential improvement: Rehabilitation Potential: Prognosis for survival: Plan:  This encounter was created in error - please disregard.

## 2012-11-18 DIAGNOSIS — N186 End stage renal disease: Secondary | ICD-10-CM | POA: Diagnosis not present

## 2012-11-18 DIAGNOSIS — D631 Anemia in chronic kidney disease: Secondary | ICD-10-CM | POA: Diagnosis not present

## 2012-11-18 DIAGNOSIS — N2581 Secondary hyperparathyroidism of renal origin: Secondary | ICD-10-CM | POA: Diagnosis not present

## 2012-11-21 DIAGNOSIS — N2581 Secondary hyperparathyroidism of renal origin: Secondary | ICD-10-CM | POA: Diagnosis not present

## 2012-11-21 DIAGNOSIS — N186 End stage renal disease: Secondary | ICD-10-CM | POA: Diagnosis not present

## 2012-11-21 DIAGNOSIS — N039 Chronic nephritic syndrome with unspecified morphologic changes: Secondary | ICD-10-CM | POA: Diagnosis not present

## 2012-11-23 DIAGNOSIS — N2581 Secondary hyperparathyroidism of renal origin: Secondary | ICD-10-CM | POA: Diagnosis not present

## 2012-11-23 DIAGNOSIS — N186 End stage renal disease: Secondary | ICD-10-CM | POA: Diagnosis not present

## 2012-11-23 DIAGNOSIS — D631 Anemia in chronic kidney disease: Secondary | ICD-10-CM | POA: Diagnosis not present

## 2012-11-25 DIAGNOSIS — N186 End stage renal disease: Secondary | ICD-10-CM | POA: Diagnosis not present

## 2012-11-25 DIAGNOSIS — N039 Chronic nephritic syndrome with unspecified morphologic changes: Secondary | ICD-10-CM | POA: Diagnosis not present

## 2012-11-25 DIAGNOSIS — N2581 Secondary hyperparathyroidism of renal origin: Secondary | ICD-10-CM | POA: Diagnosis not present

## 2012-11-28 DIAGNOSIS — N186 End stage renal disease: Secondary | ICD-10-CM | POA: Diagnosis not present

## 2012-11-28 DIAGNOSIS — N2581 Secondary hyperparathyroidism of renal origin: Secondary | ICD-10-CM | POA: Diagnosis not present

## 2012-11-28 DIAGNOSIS — N039 Chronic nephritic syndrome with unspecified morphologic changes: Secondary | ICD-10-CM | POA: Diagnosis not present

## 2012-11-29 ENCOUNTER — Non-Acute Institutional Stay (SKILLED_NURSING_FACILITY): Payer: Medicare Other | Admitting: Nurse Practitioner

## 2012-11-29 DIAGNOSIS — R5381 Other malaise: Secondary | ICD-10-CM | POA: Diagnosis not present

## 2012-11-29 DIAGNOSIS — E119 Type 2 diabetes mellitus without complications: Secondary | ICD-10-CM

## 2012-11-29 DIAGNOSIS — A0472 Enterocolitis due to Clostridium difficile, not specified as recurrent: Secondary | ICD-10-CM

## 2012-11-29 DIAGNOSIS — N186 End stage renal disease: Secondary | ICD-10-CM | POA: Diagnosis not present

## 2012-11-29 DIAGNOSIS — D649 Anemia, unspecified: Secondary | ICD-10-CM | POA: Diagnosis not present

## 2012-11-29 DIAGNOSIS — Z992 Dependence on renal dialysis: Secondary | ICD-10-CM

## 2012-11-29 NOTE — Progress Notes (Signed)
Patient ID: [name], female   DOB: Jan 14, 1933, 79 y.o.   MRN: [id number]   PCP: Irena Cords, MD   Allergies  Allergen Reactions  . Codeine Hives  . Vancomycin Rash    Chief Complaint  Patient presents with  . Discharge Note    HPI:  79 y.o. female with past medical history of diabetes, end-stage renal disease on hemodialysis, hyperparathyroidism, depression, anemia who went to the ED 10/31/2012, with a history of productive cough of yellowish sputum, shortness of breath, generalized weakness, upper airway congestion and cough. Chest x-ray which was done showed Scattered areas of predominantly linear density favored to represent atelectasis. pt went into afib with RVR and was started on cardizem drip. she tolerated to normal sinus rhythm and has maintained this, was restarted back on POs. Pt also with sepsis, bacteremia from H.fluenzae and concomitant Cdiff, has been slowly improving with generalized weakness discharged 11/10/2012 to [facility] for strength training before she returns home with son. pts stay at [facility] has been unremarkable and she is currently doing well with therapy, now stable to discharge home with home health.   Review of Systems:  Review of Systems  Constitutional: Negative for fever, chills and malaise/fatigue.  HENT: Negative for congestion and sore throat.   Eyes: Negative.   Respiratory: Negative for cough, sputum production and shortness of breath.   Cardiovascular: Negative for chest pain, palpitations and leg swelling.  Gastrointestinal: Negative for heartburn, abdominal pain, diarrhea and constipation.  Genitourinary: Negative for dysuria, urgency and frequency.  Musculoskeletal: Negative for myalgias, joint pain and falls.  Skin: Negative.   Neurological: Negative for dizziness, weakness and headaches.  Psychiatric/Behavioral: Negative for depression. The patient does not have insomnia.      Past Medical History  Diagnosis Date  .  Diabetes mellitus   . Anemia   . Hyperparathyroidism   . Depression   . Obesity   . Osteoarthritis   . MRSA (methicillin resistant staph aureus) culture positive   . Gout   . C. difficile diarrhea   . Chronic kidney disease   . Diabetes mellitus 10/31/2012   Past Surgical History  Procedure Laterality Date  . Breast biopsy    . Shuntogram Right 09/06/12    Thigh graft  . Sp declot avgg Right Sep 14, 2012    Right thigh graft   Social History:   reports that she has never smoked. She has never used smokeless tobacco. She reports that she does not drink alcohol or use illicit drugs.  Family History  Problem Relation Age of Onset  . Cancer Father   . Diabetes Sister     Medications: Patient's Medications  New Prescriptions   No medications on file  Previous Medications   ALLOPURINOL (ZYLOPRIM) 100 MG TABLET    Take 100 mg by mouth daily.     AMLODIPINE (NORVASC) 5 MG TABLET    Take 5 mg by mouth every evening.   B COMPLEX-VITAMIN C-FOLIC ACID (NEPHRO-VITE) 0.8 MG TABS    Take 0.8 mg by mouth at bedtime.     DILTIAZEM (CARDIZEM CD) 120 MG 24 HR CAPSULE    Take 1 capsule (120 mg total) by mouth daily.   LORATADINE (CLARITIN) 10 MG TABLET    Take 1 tablet (10 mg total) by mouth daily.   NUTRITIONAL SUPPLEMENTS (FEEDING SUPPLEMENT, NEPRO CARB STEADY,) LIQD    Take 237 mLs by mouth 3 (three) times daily as needed (Supplement).   SEVELAMER CARBONATE (RENVELA) 800 MG  TABLET    Take 1,600-2,400 mg by mouth 5 (five) times daily. 3 tabs with meals and 2 with snacks   TRIAMCINOLONE (KENALOG) 0.1 % CREAM    Apply 1 application topically 2 (two) times daily.    Modified Medications   No medications on file  Discontinued Medications   METRONIDAZOLE (FLAGYL) 500 MG TABLET    Take 1 tablet (500 mg total) by mouth every 8 (eight) hours. For 5 days     Physical Exam: Physical Exam  Constitutional: She is oriented to person, place, and time and well-developed, well-nourished, and in no  distress. No distress.  HENT:  Head: Normocephalic and atraumatic.  Mouth/Throat: Oropharynx is clear and moist. No oropharyngeal exudate.  Eyes: Conjunctivae and EOM are normal. Pupils are equal, round, and reactive to light.  Neck: Normal range of motion. Neck supple.  Cardiovascular: Normal rate, regular rhythm and normal heart sounds.   Pulmonary/Chest: Effort normal and breath sounds normal. No respiratory distress.  Abdominal: Soft. Bowel sounds are normal. She exhibits no distension. There is no tenderness.  Musculoskeletal: Normal range of motion. She exhibits no edema and no tenderness.  Neurological: She is alert and oriented to person, place, and time.  Skin: Skin is warm and dry. She is not diaphoretic. No erythema.  Psychiatric: Affect normal.     Filed Vitals:   11/29/12 1040  BP: 136/76  Pulse: 80  Temp: 97.2 F (36.2 C)  Resp: 20      Labs reviewed: Basic Metabolic Panel:  Recent Labs  16/10/96 1706 11/01/12 0435  11/07/12 0450 11/08/12 0448 11/09/12 0510  NA 136 136  < > 136 141 138  K 5.8* 4.3  < > 4.2 3.7 3.4*  CL 92* 94*  < > 93* 100 95*  CO2 22 31  < > 24 29 26   GLUCOSE 206* 164*  < > 132* 117* 108*  BUN 68* 26*  < > 81* 34* 47*  CREATININE 13.09* 6.22*  < > 8.35* 5.14* 7.32*  CALCIUM 9.0 8.9  < > 9.8 9.1 9.3  MG 2.2  --   --   --   --   --   PHOS  --  6.5*  --   --   --   --   < > = values in this interval not displayed. Liver Function Tests:  Recent Labs  10/31/12 1220  AST 14  ALT 8  ALKPHOS 91  BILITOT 0.9  PROT 7.5  ALBUMIN 2.9*   No results found for this basename: LIPASE, AMYLASE,  in the last 8760 hours No results found for this basename: AMMONIA,  in the last 8760 hours CBC:  Recent Labs  10/31/12 1220  11/01/12 0435  11/08/12 0448 11/09/12 0510 11/10/12 0545  WBC 21.9*  < > 17.3*  < > 29.1* 29.5* 32.3*  NEUTROABS 19.6*  --  15.0*  --   --   --   --   HGB 11.3*  < > 9.2*  < > 8.7* 8.6* 8.8*  HCT 35.6*  < > 29.3*  <  > 27.9* 28.5* 28.4*  MCV 77.1*  < > 76.7*  < > 78.8 78.3 80.2  PLT 225  < > 202  < > 356 402* 298  < > = values in this interval not displayed. Cardiac Enzymes:  Recent Labs  11/03/12 2341 11/04/12 0520 11/04/12 1128  TROPONINI <0.30 <0.30 <0.30   BNP: No components found with this basename: POCBNP,  CBG:  Recent  Labs  11/09/12 1653 11/09/12 2011 11/10/12 0645  GLUCAP 126* 183* 125*   11/22/12: Stool for Cdiff- neg   Procedures:  11/02/2012 PORTABLE CHEST - 1 VIEW Comparison: Portable chest x-ray of 11/01/2012 Findings: Haziness remains in both lung bases most consistent with atelectasis and possible effusions. No definite pneumonia is seen although pneumonia at the lung bases cannot be excluded. Follow-up two-view chest x-ray may be helpful. Cardiomegaly is stable. IMPRESSION: Little change in bibasilar opacities most consistent with atelectasis and effusions. Consider two-view chest x-ray of possible as follow-up. Original Report Authenticated By: Dwyane Dee, M.D.  11/01/2012 PORTABLE CHEST - 1 VIEW Comparison: Portable chest x-ray of 10/31/2012 Findings: The lungs are not as well aerated with increase in basilar atelectasis. Cardiomegaly is present and mild pulmonary vascular congestion cannot be excluded. IMPRESSION: Diminished aeration with increase in basilar atelectasis. Cannot exclude pneumonia. Also mild congestion cannot be excluded. Original Report Authenticated By: Dwyane Dee, M.D.  10/31/2012 .PORTABLE CHEST - 1 VIEW Comparison: 11/08/2008. Findings: Low volume chest. Areas of subsegmental atelectasis are present superimposed on scarring. Density superior to the right hemidiaphragm is favored to represent subsegmental atelectasis, seen obliquely. The cardiopericardial silhouette is within normal limits allowing for low volumes. No definite pleural effusion. No focal areas of consolidation. IMPRESSION: Low volume chest. Scattered areas of predominately linear density is favored  to represent atelectasis based on lung volumes. Consider repeat PA and lateral chest with full inspiration when patient condition permits. Original Report Authenticated By: Andreas Newport, M.D.    Assessment/Plan Atrial Fibrillation, transient - stable- conts to be in sinus rhythm.  Sepsis, Haemophilus - due to CAPresolved Cdiff colitis -resolved- stool negative  ESRD on dialysis- follows with Nephrology; conts dialysis Tuesday, Thursdays and Saturdays Diabetes mellitus-stable last A1c 5.7  Hypertension- stable on Norvasc  Anemia -anemia of chronic disease secondary to end-stage renal disease and gets aranesp with HD  Debility- improved with rehab still with some generalized weakness but much improved. pt is stable for discharge-will need PT/OT per home health. No DME needed. Rx written.  will need to follow up with PCP within 2 weeks.

## 2012-11-30 DIAGNOSIS — N039 Chronic nephritic syndrome with unspecified morphologic changes: Secondary | ICD-10-CM | POA: Diagnosis not present

## 2012-11-30 DIAGNOSIS — N186 End stage renal disease: Secondary | ICD-10-CM | POA: Diagnosis not present

## 2012-11-30 DIAGNOSIS — N2581 Secondary hyperparathyroidism of renal origin: Secondary | ICD-10-CM | POA: Diagnosis not present

## 2012-11-30 DIAGNOSIS — E1129 Type 2 diabetes mellitus with other diabetic kidney complication: Secondary | ICD-10-CM | POA: Diagnosis not present

## 2012-12-02 DIAGNOSIS — N2581 Secondary hyperparathyroidism of renal origin: Secondary | ICD-10-CM | POA: Diagnosis not present

## 2012-12-02 DIAGNOSIS — N186 End stage renal disease: Secondary | ICD-10-CM | POA: Diagnosis not present

## 2012-12-02 DIAGNOSIS — D631 Anemia in chronic kidney disease: Secondary | ICD-10-CM | POA: Diagnosis not present

## 2012-12-05 DIAGNOSIS — D631 Anemia in chronic kidney disease: Secondary | ICD-10-CM | POA: Diagnosis not present

## 2012-12-05 DIAGNOSIS — N2581 Secondary hyperparathyroidism of renal origin: Secondary | ICD-10-CM | POA: Diagnosis not present

## 2012-12-05 DIAGNOSIS — N186 End stage renal disease: Secondary | ICD-10-CM | POA: Diagnosis not present

## 2012-12-05 LAB — CULTURE, BLOOD (ROUTINE X 2)

## 2012-12-07 ENCOUNTER — Other Ambulatory Visit: Payer: Self-pay

## 2012-12-07 DIAGNOSIS — N2581 Secondary hyperparathyroidism of renal origin: Secondary | ICD-10-CM | POA: Diagnosis not present

## 2012-12-07 DIAGNOSIS — N186 End stage renal disease: Secondary | ICD-10-CM | POA: Diagnosis not present

## 2012-12-07 DIAGNOSIS — Z1231 Encounter for screening mammogram for malignant neoplasm of breast: Secondary | ICD-10-CM

## 2012-12-07 DIAGNOSIS — N039 Chronic nephritic syndrome with unspecified morphologic changes: Secondary | ICD-10-CM | POA: Diagnosis not present

## 2012-12-09 DIAGNOSIS — N2581 Secondary hyperparathyroidism of renal origin: Secondary | ICD-10-CM | POA: Diagnosis not present

## 2012-12-09 DIAGNOSIS — N186 End stage renal disease: Secondary | ICD-10-CM | POA: Diagnosis not present

## 2012-12-09 DIAGNOSIS — D631 Anemia in chronic kidney disease: Secondary | ICD-10-CM | POA: Diagnosis not present

## 2012-12-15 DIAGNOSIS — J189 Pneumonia, unspecified organism: Secondary | ICD-10-CM | POA: Diagnosis not present

## 2012-12-15 DIAGNOSIS — E7800 Pure hypercholesterolemia, unspecified: Secondary | ICD-10-CM | POA: Diagnosis not present

## 2012-12-15 DIAGNOSIS — Z6837 Body mass index (BMI) 37.0-37.9, adult: Secondary | ICD-10-CM | POA: Diagnosis not present

## 2012-12-15 DIAGNOSIS — E1129 Type 2 diabetes mellitus with other diabetic kidney complication: Secondary | ICD-10-CM | POA: Diagnosis not present

## 2012-12-15 DIAGNOSIS — Z79899 Other long term (current) drug therapy: Secondary | ICD-10-CM | POA: Diagnosis not present

## 2012-12-15 DIAGNOSIS — N185 Chronic kidney disease, stage 5: Secondary | ICD-10-CM | POA: Diagnosis not present

## 2012-12-15 DIAGNOSIS — A0472 Enterocolitis due to Clostridium difficile, not specified as recurrent: Secondary | ICD-10-CM | POA: Diagnosis not present

## 2012-12-15 DIAGNOSIS — E1149 Type 2 diabetes mellitus with other diabetic neurological complication: Secondary | ICD-10-CM | POA: Diagnosis not present

## 2012-12-28 ENCOUNTER — Encounter: Payer: Self-pay | Admitting: Vascular Surgery

## 2012-12-29 ENCOUNTER — Ambulatory Visit (INDEPENDENT_AMBULATORY_CARE_PROVIDER_SITE_OTHER): Payer: Medicare Other | Admitting: Vascular Surgery

## 2012-12-29 ENCOUNTER — Encounter (INDEPENDENT_AMBULATORY_CARE_PROVIDER_SITE_OTHER): Payer: Medicare Other | Admitting: *Deleted

## 2012-12-29 ENCOUNTER — Encounter: Payer: Self-pay | Admitting: Vascular Surgery

## 2012-12-29 VITALS — BP 148/65 | HR 83 | Ht 63.0 in | Wt 201.9 lb

## 2012-12-29 DIAGNOSIS — T82598A Other mechanical complication of other cardiac and vascular devices and implants, initial encounter: Secondary | ICD-10-CM

## 2012-12-29 DIAGNOSIS — N186 End stage renal disease: Secondary | ICD-10-CM

## 2012-12-29 DIAGNOSIS — Z4931 Encounter for adequacy testing for hemodialysis: Secondary | ICD-10-CM | POA: Diagnosis not present

## 2012-12-29 DIAGNOSIS — Z48812 Encounter for surgical aftercare following surgery on the circulatory system: Secondary | ICD-10-CM

## 2012-12-29 NOTE — Addendum Note (Signed)
Addended by: Adria Dill L on: 12/29/2012 04:45 PM   Modules accepted: Orders

## 2012-12-29 NOTE — Progress Notes (Addendum)
[facility] [place]   Established Dialysis Access   History of Present Illness  [name] is a 79 y.o. (10-12-1932) female who presents for re-evaluation of R thigh AVG (03/07/09) placed by Dr. Myra Gianotti. The pt has required multiple percutaneous interventions but none recently.  She denies any flow rate problems on HD recently.  She denies any R foot steal sx.  PMH, PSH, SH, FmH, ROS, Med, ALL unchanged from 10/06/12   Physical Examination  Filed Vitals:   12/29/12 1533  BP: 148/65  Pulse: 83  Height: 5\' 3"  (1.6 m)  Weight: 201 lb 14.4 oz (91.581 kg)  SpO2: 99%   Body mass index is 35.77 kg/(m^2).  General: A&O x 3, WD, obese   Pulmonary: Sym exp, good air movt, CTAB, no rales, rhonchi, & wheezing   Cardiac: RRR, Nl S1, S2, no Murmurs, rubs or gallops   Gastrointestinal: soft, NTND, -G/R, - HSM, - masses, - CVAT B, large pannus extends to right groin   Musculoskeletal: M/S 5/5 throughout , Extremities without ischemic changes , palpable thrill in access in R thigh, + strong bruit in access (flow signature is consistent with widely patent AVG)  Neurologic: Pain and light touch intact in extremities , Motor exam as listed above   R thigh access duplex (12/29/2012)  Arterial and venous anastomotic stenoses  Possible partially thrombosed venous arm PSA  Medical Decision Making  [name] is a 79 y.o. female who presents with ESRD requiring hemodialysis with likely venous , arterial , and intragraft stenoses  Again, I do not recommend surgical revision in this morbidly obese patient.   There is no good inflow option in this case and venous revision likely would require retroperitoneal exposure. While the access duplex suggests high grade stenoses in both anastomoses, the graft bruit is consistent with widely patent AVG, suggesting some overcall in the duplex. At some point this graft will require further intervention, I would recommend any  percutaneous intervention be completed from the contralateral femoral artery, to allow possible intervention on the iliac segment also from an antegrade direction.   Use of an orbital atherectomy device might be useful in that case. Repeat access duplex on R thigh AVG in 3 months.  Jamie Sake, MD [facility] [place] Office: [phone] Pager: [phone]  12/29/2012, 4:07 PM

## 2013-01-01 ENCOUNTER — Ambulatory Visit: Payer: Medicare Other

## 2013-01-07 DIAGNOSIS — N186 End stage renal disease: Secondary | ICD-10-CM | POA: Diagnosis not present

## 2013-01-09 DIAGNOSIS — D631 Anemia in chronic kidney disease: Secondary | ICD-10-CM | POA: Diagnosis not present

## 2013-01-09 DIAGNOSIS — N186 End stage renal disease: Secondary | ICD-10-CM | POA: Diagnosis not present

## 2013-01-09 DIAGNOSIS — N2581 Secondary hyperparathyroidism of renal origin: Secondary | ICD-10-CM | POA: Diagnosis not present

## 2013-01-10 ENCOUNTER — Ambulatory Visit: Payer: Medicare Other

## 2013-01-10 ENCOUNTER — Ambulatory Visit
Admission: RE | Admit: 2013-01-10 | Discharge: 2013-01-10 | Disposition: A | Discharge status: Home / Self Care | Payer: Medicare Other | Source: Ambulatory Visit

## 2013-01-10 DIAGNOSIS — Z1231 Encounter for screening mammogram for malignant neoplasm of breast: Secondary | ICD-10-CM | POA: Diagnosis not present

## 2013-02-06 DIAGNOSIS — N186 End stage renal disease: Secondary | ICD-10-CM | POA: Diagnosis not present

## 2013-02-08 DIAGNOSIS — Z23 Encounter for immunization: Secondary | ICD-10-CM | POA: Diagnosis not present

## 2013-02-08 DIAGNOSIS — D509 Iron deficiency anemia, unspecified: Secondary | ICD-10-CM | POA: Diagnosis not present

## 2013-02-08 DIAGNOSIS — N186 End stage renal disease: Secondary | ICD-10-CM | POA: Diagnosis not present

## 2013-02-08 DIAGNOSIS — N2581 Secondary hyperparathyroidism of renal origin: Secondary | ICD-10-CM | POA: Diagnosis not present

## 2013-02-08 DIAGNOSIS — D631 Anemia in chronic kidney disease: Secondary | ICD-10-CM | POA: Diagnosis not present

## 2013-03-01 DIAGNOSIS — E1129 Type 2 diabetes mellitus with other diabetic kidney complication: Secondary | ICD-10-CM | POA: Diagnosis not present

## 2013-03-09 DIAGNOSIS — N186 End stage renal disease: Secondary | ICD-10-CM | POA: Diagnosis not present

## 2013-03-10 DIAGNOSIS — N2581 Secondary hyperparathyroidism of renal origin: Secondary | ICD-10-CM | POA: Diagnosis not present

## 2013-03-10 DIAGNOSIS — N186 End stage renal disease: Secondary | ICD-10-CM | POA: Diagnosis not present

## 2013-03-10 DIAGNOSIS — D631 Anemia in chronic kidney disease: Secondary | ICD-10-CM | POA: Diagnosis not present

## 2013-03-10 DIAGNOSIS — D509 Iron deficiency anemia, unspecified: Secondary | ICD-10-CM | POA: Diagnosis not present

## 2013-03-16 DIAGNOSIS — I871 Compression of vein: Secondary | ICD-10-CM | POA: Diagnosis not present

## 2013-03-16 DIAGNOSIS — N186 End stage renal disease: Secondary | ICD-10-CM | POA: Diagnosis not present

## 2013-03-16 DIAGNOSIS — T82898A Other specified complication of vascular prosthetic devices, implants and grafts, initial encounter: Secondary | ICD-10-CM | POA: Diagnosis not present

## 2013-03-20 ENCOUNTER — Other Ambulatory Visit: Payer: Self-pay | Admitting: Vascular Surgery

## 2013-03-20 DIAGNOSIS — N186 End stage renal disease: Secondary | ICD-10-CM

## 2013-03-20 DIAGNOSIS — Z4931 Encounter for adequacy testing for hemodialysis: Secondary | ICD-10-CM

## 2013-04-06 ENCOUNTER — Ambulatory Visit: Payer: Medicare Other | Admitting: Vascular Surgery

## 2013-04-08 DIAGNOSIS — N186 End stage renal disease: Secondary | ICD-10-CM | POA: Diagnosis not present

## 2013-04-10 DIAGNOSIS — N186 End stage renal disease: Secondary | ICD-10-CM | POA: Diagnosis not present

## 2013-04-10 DIAGNOSIS — N2581 Secondary hyperparathyroidism of renal origin: Secondary | ICD-10-CM | POA: Diagnosis not present

## 2013-04-10 DIAGNOSIS — D631 Anemia in chronic kidney disease: Secondary | ICD-10-CM | POA: Diagnosis not present

## 2013-04-12 ENCOUNTER — Encounter: Payer: Self-pay | Admitting: Vascular Surgery

## 2013-04-13 ENCOUNTER — Other Ambulatory Visit (HOSPITAL_COMMUNITY): Payer: Medicare Other

## 2013-04-13 ENCOUNTER — Ambulatory Visit: Payer: Medicare Other | Admitting: Vascular Surgery

## 2013-04-13 ENCOUNTER — Encounter (HOSPITAL_COMMUNITY): Payer: Medicare Other

## 2013-04-26 ENCOUNTER — Ambulatory Visit (INDEPENDENT_AMBULATORY_CARE_PROVIDER_SITE_OTHER): Payer: Medicare Other | Admitting: Podiatry

## 2013-04-26 DIAGNOSIS — B351 Tinea unguium: Secondary | ICD-10-CM | POA: Diagnosis not present

## 2013-04-26 DIAGNOSIS — Q828 Other specified congenital malformations of skin: Secondary | ICD-10-CM | POA: Diagnosis not present

## 2013-04-26 DIAGNOSIS — E1159 Type 2 diabetes mellitus with other circulatory complications: Secondary | ICD-10-CM

## 2013-04-26 DIAGNOSIS — M79609 Pain in unspecified limb: Secondary | ICD-10-CM | POA: Diagnosis not present

## 2013-04-26 NOTE — Progress Notes (Signed)
Subjective:     Patient ID: [name], female   DOB: 01/16/1933, 79 y.o.   MRN: [id number]  HPI patient presents stating I have painful nailbeds of both feet and lesions of both feet that I can not take care of time and diabetic and cannot cut them myself according to my.Dr.   Review of Systems     Objective:   Physical Exam Neurovascular status unchanged as his health history and I noted there to be nail disease with thickness 1-5 both feet and painful keratotic lesions sub-fifth metatarsal both feet    Assessment:     At wrist diabetic with painful nail disease and lesions of fifth metatarsal both feet that are painful    Plan:     Debridement of painful porokeratotic lesions and debridement of nailbeds 1-5 both feet

## 2013-04-26 NOTE — Progress Notes (Signed)
   Subjective:    Patient ID: [name], female    DOB: January 24, 1933, 79 y.o.   MRN: [id number]  HPI Comments: '' toenails and callus trim.''     Review of Systems     Objective:   Physical Exam        Assessment & Plan:

## 2013-05-09 DIAGNOSIS — N186 End stage renal disease: Secondary | ICD-10-CM | POA: Diagnosis not present

## 2013-05-12 DIAGNOSIS — N2581 Secondary hyperparathyroidism of renal origin: Secondary | ICD-10-CM | POA: Diagnosis not present

## 2013-05-12 DIAGNOSIS — D509 Iron deficiency anemia, unspecified: Secondary | ICD-10-CM | POA: Diagnosis not present

## 2013-05-12 DIAGNOSIS — N186 End stage renal disease: Secondary | ICD-10-CM | POA: Diagnosis not present

## 2013-05-12 DIAGNOSIS — D631 Anemia in chronic kidney disease: Secondary | ICD-10-CM | POA: Diagnosis not present

## 2013-06-07 DIAGNOSIS — N186 End stage renal disease: Secondary | ICD-10-CM | POA: Diagnosis not present

## 2013-06-07 DIAGNOSIS — E1129 Type 2 diabetes mellitus with other diabetic kidney complication: Secondary | ICD-10-CM | POA: Diagnosis not present

## 2013-06-09 DIAGNOSIS — N186 End stage renal disease: Secondary | ICD-10-CM | POA: Diagnosis not present

## 2013-06-12 DIAGNOSIS — E209 Hypoparathyroidism, unspecified: Secondary | ICD-10-CM | POA: Diagnosis not present

## 2013-06-12 DIAGNOSIS — N186 End stage renal disease: Secondary | ICD-10-CM | POA: Diagnosis not present

## 2013-06-12 DIAGNOSIS — D631 Anemia in chronic kidney disease: Secondary | ICD-10-CM | POA: Diagnosis not present

## 2013-06-12 DIAGNOSIS — N2581 Secondary hyperparathyroidism of renal origin: Secondary | ICD-10-CM | POA: Diagnosis not present

## 2013-07-07 DIAGNOSIS — N186 End stage renal disease: Secondary | ICD-10-CM | POA: Diagnosis not present

## 2013-07-10 DIAGNOSIS — N2581 Secondary hyperparathyroidism of renal origin: Secondary | ICD-10-CM | POA: Diagnosis not present

## 2013-07-10 DIAGNOSIS — N186 End stage renal disease: Secondary | ICD-10-CM | POA: Diagnosis not present

## 2013-07-10 DIAGNOSIS — E209 Hypoparathyroidism, unspecified: Secondary | ICD-10-CM | POA: Diagnosis not present

## 2013-07-10 DIAGNOSIS — D631 Anemia in chronic kidney disease: Secondary | ICD-10-CM | POA: Diagnosis not present

## 2013-07-23 ENCOUNTER — Ambulatory Visit: Payer: Medicare Other | Admitting: Podiatry

## 2013-08-07 DIAGNOSIS — N186 End stage renal disease: Secondary | ICD-10-CM | POA: Diagnosis not present

## 2013-08-09 DIAGNOSIS — N2581 Secondary hyperparathyroidism of renal origin: Secondary | ICD-10-CM | POA: Diagnosis not present

## 2013-08-09 DIAGNOSIS — E1129 Type 2 diabetes mellitus with other diabetic kidney complication: Secondary | ICD-10-CM | POA: Diagnosis not present

## 2013-08-09 DIAGNOSIS — N186 End stage renal disease: Secondary | ICD-10-CM | POA: Diagnosis not present

## 2013-08-09 DIAGNOSIS — D631 Anemia in chronic kidney disease: Secondary | ICD-10-CM | POA: Diagnosis not present

## 2013-08-30 DIAGNOSIS — E1129 Type 2 diabetes mellitus with other diabetic kidney complication: Secondary | ICD-10-CM | POA: Diagnosis not present

## 2013-09-06 DIAGNOSIS — N186 End stage renal disease: Secondary | ICD-10-CM | POA: Diagnosis not present

## 2013-09-08 DIAGNOSIS — D631 Anemia in chronic kidney disease: Secondary | ICD-10-CM | POA: Diagnosis not present

## 2013-09-08 DIAGNOSIS — N2581 Secondary hyperparathyroidism of renal origin: Secondary | ICD-10-CM | POA: Diagnosis not present

## 2013-09-08 DIAGNOSIS — E1129 Type 2 diabetes mellitus with other diabetic kidney complication: Secondary | ICD-10-CM | POA: Diagnosis not present

## 2013-09-08 DIAGNOSIS — N186 End stage renal disease: Secondary | ICD-10-CM | POA: Diagnosis not present

## 2013-09-26 ENCOUNTER — Telehealth (HOSPITAL_COMMUNITY): Payer: Self-pay | Admitting: *Deleted

## 2013-09-26 ENCOUNTER — Ambulatory Visit (INDEPENDENT_AMBULATORY_CARE_PROVIDER_SITE_OTHER): Payer: Medicare Other | Admitting: Podiatry

## 2013-09-26 ENCOUNTER — Encounter: Payer: Self-pay | Admitting: Podiatry

## 2013-09-26 VITALS — BP 133/68 | HR 86 | Resp 16

## 2013-09-26 DIAGNOSIS — E1159 Type 2 diabetes mellitus with other circulatory complications: Secondary | ICD-10-CM

## 2013-09-26 DIAGNOSIS — B351 Tinea unguium: Secondary | ICD-10-CM

## 2013-09-26 DIAGNOSIS — Q828 Other specified congenital malformations of skin: Secondary | ICD-10-CM | POA: Diagnosis not present

## 2013-09-26 DIAGNOSIS — M79609 Pain in unspecified limb: Secondary | ICD-10-CM | POA: Diagnosis not present

## 2013-09-26 NOTE — Progress Notes (Signed)
Subjective:     Patient ID: [name], female   DOB: 05/27/1932, 80 y.o.   MRN: [id number]  HPI patient states stating her toenails have been bothering her more at her left big toenail is becoming more discomforting with ambulation and with trying to wear shoe gear   Review of Systems     Objective:   Physical Exam Neurovascular status is quite diminished on both feet and she states she's not had her circulation checked for several years and I noted nail disease 1-5 both feet with thick debris and yellow brittle type appearance with the left hallux showing bruising and indications of circulation issues. Also noted to have thick painful plantar lesion fifth metatarsal right    Assessment:     Mycotic nail infection with pain 1-5 both feet keratotic lesion sub-fifth metatarsal right and the concern of advanced vascular disease of both extremities    Plan:     Nail bed 1-5 both feet and lesion right fifth metatarsal with no iatrogenic bleeding noted and I am sending to [facility] vascular examination

## 2013-09-26 NOTE — Patient Instructions (Signed)
Diabetes and Foot Care Diabetes may cause you to have problems because of poor blood supply (circulation) to your feet and legs. This may cause the skin on your feet to become thinner, break easier, and heal more slowly. Your skin may become dry, and the skin may peel and crack. You may also have nerve damage in your legs and feet causing decreased feeling in them. You may not notice minor injuries to your feet that could lead to infections or more serious problems. Taking care of your feet is one of the most important things you can do for yourself.  HOME CARE INSTRUCTIONS  Wear shoes at all times, even in the house. Do not go barefoot. Bare feet are easily injured.  Check your feet daily for blisters, cuts, and redness. If you cannot see the bottom of your feet, use a mirror or ask someone for help.  Wash your feet with warm water (do not use hot water) and mild soap. Then pat your feet and the areas between your toes until they are completely dry. Do not soak your feet as this can dry your skin.  Apply a moisturizing lotion or petroleum jelly (that does not contain alcohol and is unscented) to the skin on your feet and to dry, brittle toenails. Do not apply lotion between your toes.  Trim your toenails straight across. Do not dig under them or around the cuticle. File the edges of your nails with an emery board or nail file.  Do not cut corns or calluses or try to remove them with medicine.  Wear clean socks or stockings every day. Make sure they are not too tight. Do not wear knee-high stockings since they may decrease blood flow to your legs.  Wear shoes that fit properly and have enough cushioning. To break in new shoes, wear them for just a few hours a day. This prevents you from injuring your feet. Always look in your shoes before you put them on to be sure there are no objects inside.  Do not cross your legs. This may decrease the blood flow to your feet.  If you find a minor scrape,  cut, or break in the skin on your feet, keep it and the skin around it clean and dry. These areas may be cleansed with mild soap and water. Do not cleanse the area with peroxide, alcohol, or iodine.  When you remove an adhesive bandage, be sure not to damage the skin around it.  If you have a wound, look at it several times a day to make sure it is healing.  Do not use heating pads or hot water bottles. They may burn your skin. If you have lost feeling in your feet or legs, you may not know it is happening until it is too late.  Make sure your health care provider performs a complete foot exam at least annually or more often if you have foot problems. Report any cuts, sores, or bruises to your health care provider immediately. SEEK MEDICAL CARE IF:   You have an injury that is not healing.  You have cuts or breaks in the skin.  You have an ingrown nail.  You notice redness on your legs or feet.  You feel burning or tingling in your legs or feet.  You have pain or cramps in your legs and feet.  Your legs or feet are numb.  Your feet always feel cold. SEEK IMMEDIATE MEDICAL CARE IF:   There is increasing redness,   swelling, or pain in or around a wound.  There is a red line that goes up your leg.  Pus is coming from a wound.  You develop a fever or as directed by your health care provider.  You notice a bad smell coming from an ulcer or wound. Document Released: 04/23/2000 Document Revised: 12/27/2012 Document Reviewed: 10/03/2012 Exit[employer] Patient Information 2014 [employer].  

## 2013-09-28 ENCOUNTER — Telehealth (HOSPITAL_COMMUNITY): Payer: Self-pay | Admitting: *Deleted

## 2013-10-07 DIAGNOSIS — N186 End stage renal disease: Secondary | ICD-10-CM | POA: Diagnosis not present

## 2013-10-09 DIAGNOSIS — E1129 Type 2 diabetes mellitus with other diabetic kidney complication: Secondary | ICD-10-CM | POA: Diagnosis not present

## 2013-10-09 DIAGNOSIS — N186 End stage renal disease: Secondary | ICD-10-CM | POA: Diagnosis not present

## 2013-10-09 DIAGNOSIS — D631 Anemia in chronic kidney disease: Secondary | ICD-10-CM | POA: Diagnosis not present

## 2013-10-09 DIAGNOSIS — N039 Chronic nephritic syndrome with unspecified morphologic changes: Secondary | ICD-10-CM | POA: Diagnosis not present

## 2013-10-09 DIAGNOSIS — N2581 Secondary hyperparathyroidism of renal origin: Secondary | ICD-10-CM | POA: Diagnosis not present

## 2013-10-10 ENCOUNTER — Ambulatory Visit (HOSPITAL_COMMUNITY)
Admission: RE | Admit: 2013-10-10 | Discharge: 2013-10-10 | Disposition: A | Discharge status: Home / Self Care | Payer: Medicare Other | Source: Ambulatory Visit | Attending: Podiatry | Admitting: Podiatry

## 2013-10-10 DIAGNOSIS — E1159 Type 2 diabetes mellitus with other circulatory complications: Secondary | ICD-10-CM | POA: Diagnosis not present

## 2013-10-10 DIAGNOSIS — L97509 Non-pressure chronic ulcer of other part of unspecified foot with unspecified severity: Secondary | ICD-10-CM | POA: Diagnosis not present

## 2013-10-10 DIAGNOSIS — I739 Peripheral vascular disease, unspecified: Secondary | ICD-10-CM

## 2013-10-10 DIAGNOSIS — I798 Other disorders of arteries, arterioles and capillaries in diseases classified elsewhere: Secondary | ICD-10-CM | POA: Diagnosis not present

## 2013-10-10 NOTE — Progress Notes (Signed)
Arterial Duplex Lower Ext. Completed. Houa Nie, BS, RDMS, RVT  

## 2013-10-11 ENCOUNTER — Telehealth (HOSPITAL_COMMUNITY): Payer: Self-pay | Admitting: *Deleted

## 2013-10-11 ENCOUNTER — Telehealth: Payer: Self-pay | Admitting: *Deleted

## 2013-10-11 DIAGNOSIS — R0989 Other specified symptoms and signs involving the circulatory and respiratory systems: Secondary | ICD-10-CM

## 2013-10-11 NOTE — Telephone Encounter (Addendum)
Pt's lower arterial dopplers of 10/10/2013 are abnormal and Dr. Charlsie Merles recommends referral to Dr. Allyson Sabal of [facility]  I referred and left message with [name] [phone] to have pt to call for instructions concerning her doppler results and referral to [facility].  Pt and caregiver called on 10/11/2013 and I informed them of the recommendation to be seen by Dr. Allyson Sabal and they stated they would set up an appt to be evaluated.

## 2013-10-16 ENCOUNTER — Telehealth: Payer: Self-pay | Admitting: *Deleted

## 2013-10-16 NOTE — Telephone Encounter (Signed)
Patient's doppler was positive for peripheral artery disease.  Does Dr. Charlsie Merles want her to have a consult with Dr. Allyson Sabal?  I informed her yes, please schedule her for a consultation.

## 2013-10-17 ENCOUNTER — Telehealth (HOSPITAL_COMMUNITY): Payer: Self-pay | Admitting: *Deleted

## 2013-10-17 DIAGNOSIS — I871 Compression of vein: Secondary | ICD-10-CM | POA: Diagnosis not present

## 2013-10-17 DIAGNOSIS — T82898A Other specified complication of vascular prosthetic devices, implants and grafts, initial encounter: Secondary | ICD-10-CM | POA: Diagnosis not present

## 2013-10-17 DIAGNOSIS — N186 End stage renal disease: Secondary | ICD-10-CM | POA: Diagnosis not present

## 2013-10-26 ENCOUNTER — Encounter: Payer: Self-pay | Admitting: Cardiovascular Disease

## 2013-10-26 ENCOUNTER — Ambulatory Visit (INDEPENDENT_AMBULATORY_CARE_PROVIDER_SITE_OTHER): Payer: Medicare Other | Admitting: Cardiovascular Disease

## 2013-10-26 VITALS — BP 138/61 | HR 71 | Ht 63.0 in | Wt 204.5 lb

## 2013-10-26 DIAGNOSIS — I739 Peripheral vascular disease, unspecified: Secondary | ICD-10-CM | POA: Diagnosis not present

## 2013-10-26 NOTE — Patient Instructions (Signed)
Your physician recommends that you schedule a follow-up appointment in: THREE MONTHS with Dr.Berry

## 2013-10-26 NOTE — Assessment & Plan Note (Signed)
The patient was referred to me by Dr. Charlsie Merlesegal at [facility] for evaluation of peripheral arterial disease.risk factors include diabetes. She does have chronic renal insufficiency on hemodialysis since 2008. She did have a nonhealing wound on her left great toe last year which healed with aggressive local care. She was referred to me for evaluation of a darkly colored left great toe without obvious skin breakdown. She denies claudication. Lower extremity arterial Doppler studies performed 10/10/13 revealed a right ABI of 1.1 the left of 0.75 probably falsely elevated due to calcification patient. Her TBIs were  0.32 on the right and 27 on the left.she does have an AV fistula in her right thigh. All tibial vessels bilaterally are occluded.. At this point I recommend conservative care. Temperature that her tibials are diffusely diseased and probably non-revascularizable by percutaneous means given the chronicity of her diabetes and chronic renal insufficiency. Certainly, if her wound progresses I would consider doing an event for limb salvage. I will see her back in 3 months for followup.

## 2013-10-26 NOTE — Progress Notes (Signed)
10/26/2013 [name]   01/21/1933  [id number]  Primary Physician Irena CordsOLADONATO,JOSEPH A, MD Primary Cardiologist: Runell GessJonathan J. Berry MD Roseanne RenoFACP,FACC,FAHA, FSCAI   HPI:  [name] is a very pleasant 80 year old moderately overweight widowed [place] female with no children who lives alone and is independent. Her primary care physician is Dr. Mila PalmerSharon Wolters. She was referred by Dr. Charlsie Merlesegal for peripheral vascular evaluation because of a mildly painful discolored left great toe. Her cardiovascular risk factor profile is only remarkable for diabetes. She has been hemodialysis dependent for 7 years. She's never had a heart attack or stroke, denies chest pain, shortness of breath or claudication. She did have a nonhealing wound on her left great toe last year which ultimately healed with aggressive local care. Lower extremity arterial Doppler studies performed in our office/3/15 revealed right ABI 1.1 and left of 0.75, was ordered which is slightly elevated because of vessel calcification. Her toe brachial indices were 0.32 on the right and 27 on the left. She has zero vessel runoff bilaterally.   Current Outpatient Prescriptions  Medication Sig Dispense Refill  . acetaminophen (TYLENOL) 500 MG tablet Take 500 mg by mouth every 6 (six) hours as needed for pain.      Marland Kitchen. gabapentin (NEURONTIN) 100 MG capsule Take 2 capsules by mouth daily.      . SENSIPAR 30 MG tablet Take 30 mg by mouth every evening.       . sevelamer carbonate (RENVELA) 800 MG tablet Take 1,600-2,400 mg by mouth 5 (five) times daily. 3 tabs with meals and 2 with snacks       No current facility-administered medications for this visit.    Allergies  Allergen Reactions  . Codeine Hives  . Vancomycin Rash    History   Social History  . Marital Status: Divorced    Spouse Name: N/A    Number of Children: N/A  . Years of Education: N/A   Occupational History  . Not on file.   Social History Main Topics  . Smoking  status: Never Smoker   . Smokeless tobacco: Never Used  . Alcohol Use: No  . Drug Use: No  . Sexual Activity: Not Currently   Other Topics Concern  . Not on file   Social History Narrative  . No narrative on file     Review of Systems: General: negative for chills, fever, night sweats or weight changes.  Cardiovascular: negative for chest pain, dyspnea on exertion, edema, orthopnea, palpitations, paroxysmal nocturnal dyspnea or shortness of breath Dermatological: negative for rash Respiratory: negative for cough or wheezing Urologic: negative for hematuria Abdominal: negative for nausea, vomiting, diarrhea, bright red blood per rectum, melena, or hematemesis Neurologic: negative for visual changes, syncope, or dizziness All other systems reviewed and are otherwise negative except as noted above.    Blood pressure 138/61, pulse 71, height 5\' 3"  (1.6 m), weight 204 lb 8 oz (92.761 kg).  General appearance: alert and no distress Neck: no adenopathy, no carotid bruit, no JVD, supple, symmetrical, trachea midline and thyroid not enlarged, symmetric, no tenderness/mass/nodules Lungs: clear to auscultation bilaterally Heart: regular rate and rhythm, S1, S2 normal, no murmur, click, rub or gallop Extremities: extremities normal, atraumatic, no cyanosis or edema and absent pedal pulses bilaterally, and black discolored left great toe without obvious skin breakdown  EKG not performed today  ASSESSMENT AND PLAN:   Peripheral arterial disease The patient was referred to me by Dr. Charlsie Merlesegal at [facility] for evaluation  of peripheral arterial disease.risk factors include diabetes. She does have chronic renal insufficiency on hemodialysis since 2008. She did have a nonhealing wound on her left great toe last year which healed with aggressive local care. She was referred to me for evaluation of a darkly colored left great toe without obvious skin breakdown. She denies claudication. Lower extremity  arterial Doppler studies performed 10/10/13 revealed a right ABI of 1.1 the left of 0.75 probably falsely elevated due to calcification patient. Her TBIs were  0.32 on the right and 27 on the left.she does have an AV fistula in her right thigh. All tibial vessels bilaterally are occluded.. At this point I recommend conservative care. Temperature that her tibials are diffusely diseased and probably non-revascularizable by percutaneous means given the chronicity of her diabetes and chronic renal insufficiency. Certainly, if her wound progresses I would consider doing an event for limb salvage. I will see her back in 3 months for followup.      Runell GessJonathan J. Berry MD FACP,FACC,FAHA, [facility] Inc[facility]SCAI 10/26/2013 12:54 PM

## 2013-10-30 ENCOUNTER — Ambulatory Visit: Payer: Medicare Other | Admitting: Cardiovascular Disease

## 2013-11-06 DIAGNOSIS — N186 End stage renal disease: Secondary | ICD-10-CM | POA: Diagnosis not present

## 2013-11-08 DIAGNOSIS — E1129 Type 2 diabetes mellitus with other diabetic kidney complication: Secondary | ICD-10-CM | POA: Diagnosis not present

## 2013-11-08 DIAGNOSIS — D631 Anemia in chronic kidney disease: Secondary | ICD-10-CM | POA: Diagnosis not present

## 2013-11-08 DIAGNOSIS — N2581 Secondary hyperparathyroidism of renal origin: Secondary | ICD-10-CM | POA: Diagnosis not present

## 2013-11-08 DIAGNOSIS — N186 End stage renal disease: Secondary | ICD-10-CM | POA: Diagnosis not present

## 2013-11-13 ENCOUNTER — Encounter: Payer: Self-pay | Admitting: Gastroenterology

## 2013-11-28 ENCOUNTER — Ambulatory Visit (INDEPENDENT_AMBULATORY_CARE_PROVIDER_SITE_OTHER): Payer: Medicare Other | Admitting: Gastroenterology

## 2013-11-28 ENCOUNTER — Ambulatory Visit: Payer: Medicare Other | Admitting: Cardiovascular Disease

## 2013-11-28 ENCOUNTER — Encounter: Payer: Self-pay | Admitting: Gastroenterology

## 2013-11-28 VITALS — BP 90/60 | HR 84 | Ht 61.0 in | Wt 200.5 lb

## 2013-11-28 DIAGNOSIS — K529 Noninfective gastroenteritis and colitis, unspecified: Secondary | ICD-10-CM

## 2013-11-28 DIAGNOSIS — R1310 Dysphagia, unspecified: Secondary | ICD-10-CM | POA: Diagnosis not present

## 2013-11-28 DIAGNOSIS — R197 Diarrhea, unspecified: Secondary | ICD-10-CM | POA: Diagnosis not present

## 2013-11-28 MED ORDER — OMEPRAZOLE 20 MG PO CPDR: 20.0000 mg | DELAYED_RELEASE_CAPSULE | Freq: Every day | ORAL | Status: DC

## 2013-11-28 NOTE — Assessment & Plan Note (Signed)
Chronic diarrhea may be a postinfectious diarrhea.  Pseudomembranous colitis should be ruled out.  Recommendations #1 check stool for C. difficile toxin #2 flora q. one daily

## 2013-11-28 NOTE — Assessment & Plan Note (Signed)
Etiologies including Candida esophagitis and peptic stricture are possibilities.  Recommendations #1 upper endoscopy dilation as indicated #2 begin omeprazole 20 mg daily

## 2013-11-28 NOTE — Patient Instructions (Signed)
You have been scheduled for an endoscopy. Please follow written instructions given to you at your visit today. If you use inhalers (even only as needed), please bring them with you on the day of your procedure. Your physician has requested that you go to www.startemmi.com and enter the access code given to you at your visit today. This web site gives a general overview about your procedure. However, you should still follow specific instructions given to you by our office regarding your preparation for the procedure.  Go to the basement for labs Use FloraQ over the counter once a day Omeprazole will be sent to your pharmacy

## 2013-11-28 NOTE — Progress Notes (Signed)
_                                                                                                                History of Present Illness: Pleasant 80 year old Afro-American female with history of Pseudomonas colitis, end-stage renal disease, on dialysis, diabetes, referred for dysphagia and diarrhea.  He complains of dysphagia to solids and has some mild chest burning as well.  This is accompanied by a pyrosis.  For months she's had postprandial diarrhea.  This is accompanied by urgency.  He does not awaken at night to move her bowels.  She rarely may see some blood dripping into the toilet.  She thinks she is taking antibiotics although she is unclear about this.  She's had pseudomembranous colitis in the past.  Colonoscopy in 2009 for rectal bleeding apparently was normal.    Past Medical History  Diagnosis Date  . Diabetes mellitus   . Anemia   . Hyperparathyroidism   . Depression   . Obesity   . Osteoarthritis   . MRSA (methicillin resistant staph aureus) culture positive   . Gout   . C. difficile diarrhea   . Chronic kidney disease     on Dialysis  . Peripheral arterial disease   . Diabetic neuropathy    Past Surgical History  Procedure Laterality Date  . Breast biopsy Right   . Shuntogram Right 09/06/12    Thigh graft  . Sp declot avgg Right Sep 14, 2012    Right thigh graft   family history includes Cancer in her brother and father; Diabetes in her sister. Current Outpatient Prescriptions  Medication Sig Dispense Refill  . acetaminophen (TYLENOL) 500 MG tablet Take 500 mg by mouth every 6 (six) hours as needed for pain.      Marland Kitchen. gabapentin (NEURONTIN) 100 MG capsule Take 2 capsules by mouth daily.      . SENSIPAR 30 MG tablet Take 30 mg by mouth every evening.       . sevelamer carbonate (RENVELA) 800 MG tablet Take 1,600-2,400 mg by mouth 5 (five) times daily. 3 tabs with meals and 2 with snacks       No current facility-administered medications for  this visit.   Allergies as of 11/28/2013 - Review Complete 11/28/2013  Allergen Reaction Noted  . Codeine Hives 09/11/2011  . Vancomycin Rash 12/25/2010    reports that she has never smoked. She has never used smokeless tobacco. She reports that she does not drink alcohol or use illicit drugs.     Review of Systems: Pertinent positive and negative review of systems were noted in the above HPI section. All other review of systems were otherwise negative.  Vital signs were reviewed in today's medical record Physical Exam: General: Well developed , well nourished, no acute distress Skin: anicteric Head: Normocephalic and atraumatic Eyes:  sclerae anicteric, EOMI Ears: Normal auditory acuity Mouth: No deformity or lesions Neck: Supple, no masses or thyromegaly Lungs: Clear throughout to auscultation Heart: Regular rate and rhythm; no  murmurs, rubs or bruits Abdomen: Soft, non tender and non distended. No masses, hepatosplenomegaly or hernias noted. Normal Bowel sounds Rectal:deferred Musculoskeletal: Symmetrical with no gross deformities  Skin: No lesions on visible extremities Pulses:  Normal pulses noted Extremities: No clubbing, cyanosis, edema or deformities noted Neurological: Alert oriented x 4, grossly nonfocal Cervical Nodes:  No significant cervical adenopathy Inguinal Nodes: No significant inguinal adenopathy Psychological:  Alert and cooperative. Normal mood and affect  See Assessment and Plan under Problem List

## 2013-11-29 ENCOUNTER — Other Ambulatory Visit: Payer: Medicare Other

## 2013-11-29 DIAGNOSIS — E1129 Type 2 diabetes mellitus with other diabetic kidney complication: Secondary | ICD-10-CM | POA: Diagnosis not present

## 2013-11-29 DIAGNOSIS — R197 Diarrhea, unspecified: Secondary | ICD-10-CM

## 2013-11-30 LAB — CLOSTRIDIUM DIFFICILE BY PCR: CDIFFPCR: DETECTED — AB

## 2013-12-05 ENCOUNTER — Other Ambulatory Visit: Payer: Self-pay

## 2013-12-05 MED ORDER — METRONIDAZOLE 250 MG PO TABS: 250.0000 mg | ORAL_TABLET | Freq: Three times a day (TID) | ORAL | Status: DC

## 2013-12-07 ENCOUNTER — Encounter: Payer: Self-pay | Admitting: Gastroenterology

## 2013-12-07 ENCOUNTER — Ambulatory Visit (AMBULATORY_SURGERY_CENTER): Payer: Medicare Other | Admitting: Gastroenterology

## 2013-12-07 VITALS — BP 119/57 | HR 103 | Temp 97.0°F | Resp 16 | Ht 61.0 in | Wt 200.0 lb

## 2013-12-07 DIAGNOSIS — R1310 Dysphagia, unspecified: Secondary | ICD-10-CM | POA: Diagnosis not present

## 2013-12-07 DIAGNOSIS — K222 Esophageal obstruction: Secondary | ICD-10-CM | POA: Diagnosis not present

## 2013-12-07 DIAGNOSIS — N186 End stage renal disease: Secondary | ICD-10-CM | POA: Diagnosis not present

## 2013-12-07 DIAGNOSIS — D649 Anemia, unspecified: Secondary | ICD-10-CM | POA: Diagnosis not present

## 2013-12-07 DIAGNOSIS — E119 Type 2 diabetes mellitus without complications: Secondary | ICD-10-CM | POA: Diagnosis not present

## 2013-12-07 DIAGNOSIS — I739 Peripheral vascular disease, unspecified: Secondary | ICD-10-CM | POA: Diagnosis not present

## 2013-12-07 DIAGNOSIS — E669 Obesity, unspecified: Secondary | ICD-10-CM | POA: Diagnosis not present

## 2013-12-07 LAB — GLUCOSE, CAPILLARY: Glucose-Capillary: 91 mg/dL (ref 70–99)

## 2013-12-07 MED ORDER — SODIUM CHLORIDE 0.9 % IV SOLN: 500.0000 mL | INTRAVENOUS | Status: DC

## 2013-12-07 NOTE — Op Note (Signed)
[facility] [address] [place], [id number]   ENDOSCOPY PROCEDURE REPORT  PATIENT: [name], Jamie G.  MR#: [id number] BIRTHDATE: [birth year] , 81  yrs. old GENDER: Female ENDOSCOPIST: Louis Meckelobert D Zabria Liss, MD REFERRED BY: PROCEDURE DATE:  12/07/2013 PROCEDURE:  EGD, diagnostic and Maloney dilation of esophagus ASA CLASS:     Class II INDICATIONS:  Dysphagia. MEDICATIONS: MAC sedation, administered by CRNA, Propofol (Diprivan) 80 mg IV, and Simethicone 0.6cc PO TOPICAL ANESTHETIC:  DESCRIPTION OF PROCEDURE: After the risks benefits and alternatives of the procedure were thoroughly explained, informed consent was obtained.  The LB [id number] and LB [id number] endoscope was introduced through the mouth and advanced to the third portion of the duodenum. Without limitations.  The instrument was slowly withdrawn as the mucosa was fully examined.      An early esophageal stricture was present at the GE junction.  The 9 mm gastroscope easily traversed the stricture.  A 3 cm sliding hiatal hernia was present.   An early esophageal stricture was present at the GE junction.  The 9 mm gastroscope easily traversed the stricture.  A 3 cm sliding hiatal hernia was present.   The remainder of the upper endoscopy exam was otherwise normal. Retroflexed views revealed no abnormalities.     The scope was then withdrawn from the patient and the procedure completed.  A #52 Jerene DillingFrench Maloney dilator was passed with mild resistance.  There was no heme.  COMPLICATIONS: There were no complications.  ENDOSCOPIC IMPRESSION: early esophageal strict[facility]  RECOMMENDATIONS: Repeat dilation as needed REPEAT EXAM:  eSigned:  Louis Meckelobert D Dionel Archey, MD 12/07/2013 3:13 PM   CC:

## 2013-12-07 NOTE — Progress Notes (Signed)
Called to room to assist during endoscopic procedure.  Patient ID and intended procedure confirmed with present staff. Received instructions for my participation in the procedure from the performing physician.  

## 2013-12-07 NOTE — Patient Instructions (Signed)

## 2013-12-07 NOTE — Progress Notes (Signed)
Pt. Became tachycardic in recovery.  Kathlene NovemberBill Walton CRNA stated that she received 0.2 mg Robinul in procedure room.  Pt. Denies pain or discomfort.

## 2013-12-07 NOTE — Progress Notes (Signed)
Awake, procedure ends, to recovery, report given and VSS.

## 2013-12-08 DIAGNOSIS — N039 Chronic nephritic syndrome with unspecified morphologic changes: Secondary | ICD-10-CM | POA: Diagnosis not present

## 2013-12-08 DIAGNOSIS — D631 Anemia in chronic kidney disease: Secondary | ICD-10-CM | POA: Diagnosis not present

## 2013-12-08 DIAGNOSIS — N186 End stage renal disease: Secondary | ICD-10-CM | POA: Diagnosis not present

## 2013-12-08 DIAGNOSIS — E1129 Type 2 diabetes mellitus with other diabetic kidney complication: Secondary | ICD-10-CM | POA: Diagnosis not present

## 2013-12-08 DIAGNOSIS — N2581 Secondary hyperparathyroidism of renal origin: Secondary | ICD-10-CM | POA: Diagnosis not present

## 2013-12-10 ENCOUNTER — Telehealth: Payer: Self-pay

## 2013-12-10 NOTE — Telephone Encounter (Signed)
  Follow up Call-  Call back number 12/07/2013  Post procedure Call Back phone  # [phone]  Permission to leave phone message Yes     Patient questions:  Do you have a fever, pain , or abdominal swelling? No. Pain Score  0 *  Have you tolerated food without any problems? Yes.    Have you been able to return to your normal activities? Yes.    Do you have any questions about your discharge instructions: Diet   No. Medications  No. Follow up visit  No.  Do you have questions or concerns about your Care? No.  Actions: * If pain score is 4 or above: No action needed, pain <4.

## 2013-12-14 ENCOUNTER — Inpatient Hospital Stay (HOSPITAL_COMMUNITY)
Admission: EM | Admit: 2013-12-14 | Discharge: 2013-12-17 | DRG: 064 | Disposition: A | Discharge status: Home Health | Payer: Medicare Other | Attending: Internal Medicine | Admitting: Internal Medicine

## 2013-12-14 ENCOUNTER — Emergency Department (HOSPITAL_COMMUNITY): Payer: Medicare Other

## 2013-12-14 ENCOUNTER — Encounter (HOSPITAL_COMMUNITY): Payer: Self-pay | Admitting: Emergency Medicine

## 2013-12-14 ENCOUNTER — Inpatient Hospital Stay (HOSPITAL_COMMUNITY): Payer: Medicare Other

## 2013-12-14 DIAGNOSIS — N186 End stage renal disease: Secondary | ICD-10-CM | POA: Diagnosis not present

## 2013-12-14 DIAGNOSIS — I6330 Cerebral infarction due to thrombosis of unspecified cerebral artery: Secondary | ICD-10-CM | POA: Diagnosis not present

## 2013-12-14 DIAGNOSIS — E669 Obesity, unspecified: Secondary | ICD-10-CM | POA: Diagnosis present

## 2013-12-14 DIAGNOSIS — E1129 Type 2 diabetes mellitus with other diabetic kidney complication: Secondary | ICD-10-CM | POA: Diagnosis present

## 2013-12-14 DIAGNOSIS — N189 Chronic kidney disease, unspecified: Secondary | ICD-10-CM

## 2013-12-14 DIAGNOSIS — Z881 Allergy status to other antibiotic agents status: Secondary | ICD-10-CM

## 2013-12-14 DIAGNOSIS — Z6837 Body mass index (BMI) 37.0-37.9, adult: Secondary | ICD-10-CM | POA: Diagnosis not present

## 2013-12-14 DIAGNOSIS — E875 Hyperkalemia: Secondary | ICD-10-CM

## 2013-12-14 DIAGNOSIS — Z833 Family history of diabetes mellitus: Secondary | ICD-10-CM | POA: Diagnosis not present

## 2013-12-14 DIAGNOSIS — D649 Anemia, unspecified: Secondary | ICD-10-CM | POA: Diagnosis present

## 2013-12-14 DIAGNOSIS — R0603 Acute respiratory distress: Secondary | ICD-10-CM

## 2013-12-14 DIAGNOSIS — E1139 Type 2 diabetes mellitus with other diabetic ophthalmic complication: Secondary | ICD-10-CM | POA: Diagnosis not present

## 2013-12-14 DIAGNOSIS — J9819 Other pulmonary collapse: Secondary | ICD-10-CM | POA: Diagnosis not present

## 2013-12-14 DIAGNOSIS — Q211 Atrial septal defect: Secondary | ICD-10-CM | POA: Diagnosis not present

## 2013-12-14 DIAGNOSIS — I739 Peripheral vascular disease, unspecified: Secondary | ICD-10-CM | POA: Diagnosis not present

## 2013-12-14 DIAGNOSIS — M1990 Unspecified osteoarthritis, unspecified site: Secondary | ICD-10-CM | POA: Diagnosis present

## 2013-12-14 DIAGNOSIS — I120 Hypertensive chronic kidney disease with stage 5 chronic kidney disease or end stage renal disease: Secondary | ICD-10-CM | POA: Diagnosis not present

## 2013-12-14 DIAGNOSIS — R29898 Other symptoms and signs involving the musculoskeletal system: Secondary | ICD-10-CM | POA: Diagnosis present

## 2013-12-14 DIAGNOSIS — E1142 Type 2 diabetes mellitus with diabetic polyneuropathy: Secondary | ICD-10-CM | POA: Diagnosis present

## 2013-12-14 DIAGNOSIS — I639 Cerebral infarction, unspecified: Secondary | ICD-10-CM | POA: Diagnosis present

## 2013-12-14 DIAGNOSIS — M109 Gout, unspecified: Secondary | ICD-10-CM | POA: Diagnosis present

## 2013-12-14 DIAGNOSIS — E1149 Type 2 diabetes mellitus with other diabetic neurological complication: Secondary | ICD-10-CM | POA: Diagnosis present

## 2013-12-14 DIAGNOSIS — Z885 Allergy status to narcotic agent status: Secondary | ICD-10-CM

## 2013-12-14 DIAGNOSIS — N2581 Secondary hyperparathyroidism of renal origin: Secondary | ICD-10-CM | POA: Diagnosis present

## 2013-12-14 DIAGNOSIS — I499 Cardiac arrhythmia, unspecified: Secondary | ICD-10-CM | POA: Diagnosis not present

## 2013-12-14 DIAGNOSIS — Q2111 Secundum atrial septal defect: Secondary | ICD-10-CM

## 2013-12-14 DIAGNOSIS — Z992 Dependence on renal dialysis: Secondary | ICD-10-CM

## 2013-12-14 DIAGNOSIS — F329 Major depressive disorder, single episode, unspecified: Secondary | ICD-10-CM

## 2013-12-14 DIAGNOSIS — I6350 Cerebral infarction due to unspecified occlusion or stenosis of unspecified cerebral artery: Secondary | ICD-10-CM

## 2013-12-14 DIAGNOSIS — I6320 Cerebral infarction due to unspecified occlusion or stenosis of unspecified precerebral arteries: Secondary | ICD-10-CM | POA: Diagnosis not present

## 2013-12-14 DIAGNOSIS — R1310 Dysphagia, unspecified: Secondary | ICD-10-CM

## 2013-12-14 DIAGNOSIS — F32A Depression, unspecified: Secondary | ICD-10-CM

## 2013-12-14 DIAGNOSIS — M899 Disorder of bone, unspecified: Secondary | ICD-10-CM | POA: Diagnosis not present

## 2013-12-14 DIAGNOSIS — R4789 Other speech disturbances: Secondary | ICD-10-CM | POA: Diagnosis not present

## 2013-12-14 DIAGNOSIS — I6789 Other cerebrovascular disease: Secondary | ICD-10-CM | POA: Diagnosis present

## 2013-12-14 DIAGNOSIS — K529 Noninfective gastroenteritis and colitis, unspecified: Secondary | ICD-10-CM

## 2013-12-14 DIAGNOSIS — D500 Iron deficiency anemia secondary to blood loss (chronic): Secondary | ICD-10-CM | POA: Diagnosis not present

## 2013-12-14 DIAGNOSIS — I6359 Cerebral infarction due to unspecified occlusion or stenosis of other cerebral artery: Secondary | ICD-10-CM

## 2013-12-14 DIAGNOSIS — R404 Transient alteration of awareness: Secondary | ICD-10-CM | POA: Diagnosis not present

## 2013-12-14 DIAGNOSIS — N039 Chronic nephritic syndrome with unspecified morphologic changes: Secondary | ICD-10-CM | POA: Diagnosis present

## 2013-12-14 DIAGNOSIS — I69998 Other sequelae following unspecified cerebrovascular disease: Secondary | ICD-10-CM | POA: Diagnosis not present

## 2013-12-14 DIAGNOSIS — I10 Essential (primary) hypertension: Secondary | ICD-10-CM | POA: Diagnosis not present

## 2013-12-14 DIAGNOSIS — I059 Rheumatic mitral valve disease, unspecified: Secondary | ICD-10-CM | POA: Diagnosis not present

## 2013-12-14 DIAGNOSIS — M86179 Other acute osteomyelitis, unspecified ankle and foot: Secondary | ICD-10-CM | POA: Diagnosis not present

## 2013-12-14 DIAGNOSIS — D631 Anemia in chronic kidney disease: Secondary | ICD-10-CM

## 2013-12-14 DIAGNOSIS — R5381 Other malaise: Secondary | ICD-10-CM | POA: Diagnosis not present

## 2013-12-14 DIAGNOSIS — A0472 Enterocolitis due to Clostridium difficile, not specified as recurrent: Secondary | ICD-10-CM

## 2013-12-14 DIAGNOSIS — K219 Gastro-esophageal reflux disease without esophagitis: Secondary | ICD-10-CM | POA: Diagnosis present

## 2013-12-14 DIAGNOSIS — D72829 Elevated white blood cell count, unspecified: Secondary | ICD-10-CM

## 2013-12-14 DIAGNOSIS — I6300 Cerebral infarction due to thrombosis of unspecified precerebral artery: Secondary | ICD-10-CM

## 2013-12-14 DIAGNOSIS — A413 Sepsis due to Hemophilus influenzae: Secondary | ICD-10-CM

## 2013-12-14 DIAGNOSIS — M79609 Pain in unspecified limb: Secondary | ICD-10-CM | POA: Diagnosis not present

## 2013-12-14 DIAGNOSIS — G319 Degenerative disease of nervous system, unspecified: Secondary | ICD-10-CM | POA: Diagnosis not present

## 2013-12-14 DIAGNOSIS — N184 Chronic kidney disease, stage 4 (severe): Secondary | ICD-10-CM | POA: Diagnosis not present

## 2013-12-14 DIAGNOSIS — E1151 Type 2 diabetes mellitus with diabetic peripheral angiopathy without gangrene: Secondary | ICD-10-CM | POA: Diagnosis present

## 2013-12-14 LAB — COMPREHENSIVE METABOLIC PANEL
ALK PHOS: 82 U/L (ref 39–117)
ALT: 7 U/L (ref 0–35)
AST: 15 U/L (ref 0–37)
Albumin: 3.2 g/dL — ABNORMAL LOW (ref 3.5–5.2)
Anion gap: 16 — ABNORMAL HIGH (ref 5–15)
BUN: 19 mg/dL (ref 6–23)
CO2: 26 meq/L (ref 19–32)
Calcium: 8.6 mg/dL (ref 8.4–10.5)
Chloride: 100 mEq/L (ref 96–112)
Creatinine, Ser: 6.26 mg/dL — ABNORMAL HIGH (ref 0.50–1.10)
GFR, EST AFRICAN AMERICAN: 7 mL/min — AB (ref >90)
GFR, EST NON AFRICAN AMERICAN: 6 mL/min — AB (ref >90)
GLUCOSE: 98 mg/dL (ref 70–99)
POTASSIUM: 4.2 meq/L (ref 3.7–5.3)
SODIUM: 142 meq/L (ref 137–147)
Total Bilirubin: 0.4 mg/dL (ref 0.3–1.2)
Total Protein: 7.3 g/dL (ref 6.0–8.3)

## 2013-12-14 LAB — GLUCOSE, CAPILLARY
GLUCOSE-CAPILLARY: 78 mg/dL (ref 70–99)
GLUCOSE-CAPILLARY: 120 mg/dL — AB (ref 70–99)

## 2013-12-14 LAB — DIFFERENTIAL
BASOS PCT: 1 % (ref 0–1)
Basophils Absolute: 0.1 10*3/uL (ref 0.0–0.1)
Eosinophils Absolute: 0.2 10*3/uL (ref 0.0–0.7)
Eosinophils Relative: 2 % (ref 0–5)
LYMPHS ABS: 2 10*3/uL (ref 0.7–4.0)
Lymphocytes Relative: 18 % (ref 12–46)
MONOS PCT: 5 % (ref 3–12)
Monocytes Absolute: 0.6 10*3/uL (ref 0.1–1.0)
NEUTROS ABS: 8.3 10*3/uL — AB (ref 1.7–7.7)
Neutrophils Relative %: 74 % (ref 43–77)

## 2013-12-14 LAB — I-STAT CHEM 8, ED
BUN: 21 mg/dL (ref 6–23)
CALCIUM ION: 1.02 mmol/L — AB (ref 1.13–1.30)
Chloride: 101 mEq/L (ref 96–112)
Creatinine, Ser: 7.2 mg/dL — ABNORMAL HIGH (ref 0.50–1.10)
Glucose, Bld: 100 mg/dL — ABNORMAL HIGH (ref 70–99)
HCT: 36 % (ref 36.0–46.0)
Hemoglobin: 12.2 g/dL (ref 12.0–15.0)
Potassium: 3.9 mEq/L (ref 3.7–5.3)
Sodium: 140 mEq/L (ref 137–147)
TCO2: 26 mmol/L (ref 0–100)

## 2013-12-14 LAB — CBC
HCT: 34.3 % — ABNORMAL LOW (ref 36.0–46.0)
HEMOGLOBIN: 10 g/dL — AB (ref 12.0–15.0)
MCH: 24 pg — ABNORMAL LOW (ref 26.0–34.0)
MCHC: 29.2 g/dL — AB (ref 30.0–36.0)
MCV: 82.5 fL (ref 78.0–100.0)
Platelets: 237 10*3/uL (ref 150–400)
RBC: 4.16 MIL/uL (ref 3.87–5.11)
RDW: 15.6 % — AB (ref 11.5–15.5)
WBC: 11.2 10*3/uL — AB (ref 4.0–10.5)

## 2013-12-14 LAB — ETHANOL: Alcohol, Ethyl (B): <11 mg/dL (ref 0–11)

## 2013-12-14 LAB — APTT: APTT: 34 s (ref 24–37)

## 2013-12-14 LAB — PROTIME-INR
INR: 0.95 (ref 0.00–1.49)
Prothrombin Time: 12.7 seconds (ref 11.6–15.2)

## 2013-12-14 LAB — MRSA PCR SCREENING: MRSA by PCR: NEGATIVE

## 2013-12-14 LAB — I-STAT TROPONIN, ED: TROPONIN I, POC: 0.01 ng/mL (ref 0.00–0.08)

## 2013-12-14 MED ORDER — ACETAMINOPHEN 325 MG PO TABS: 650.0000 mg | ORAL_TABLET | ORAL | Status: DC | PRN

## 2013-12-14 MED ORDER — LANTHANUM CARBONATE 500 MG PO CHEW: 1000.0000 mg | CHEWABLE_TABLET | Freq: Three times a day (TID) | ORAL | Status: DC | PRN

## 2013-12-14 MED ORDER — HEPARIN SODIUM (PORCINE) 5000 UNIT/ML IJ SOLN
5000.0000 [IU] | Freq: Three times a day (TID) | INTRAMUSCULAR | Status: DC
  Administered 2013-12-14 – 2013-12-17 (×9): 5000 [IU] via SUBCUTANEOUS
  Filled 2013-12-14 (×8): qty 1

## 2013-12-14 MED ORDER — CINACALCET HCL 30 MG PO TABS
30.0000 mg | ORAL_TABLET | Freq: Every evening | ORAL | Status: DC
  Administered 2013-12-14 – 2013-12-17 (×3): 30 mg via ORAL
  Filled 2013-12-14 (×6): qty 1

## 2013-12-14 MED ORDER — GABAPENTIN 100 MG PO CAPS
100.0000 mg | ORAL_CAPSULE | Freq: Two times a day (BID) | ORAL | Status: DC
  Administered 2013-12-14 – 2013-12-17 (×6): 100 mg via ORAL
  Filled 2013-12-14 (×7): qty 1

## 2013-12-14 MED ORDER — STROKE: EARLY STAGES OF RECOVERY BOOK
Freq: Once | Status: AC
  Administered 2013-12-15: 06:00:00
  Filled 2013-12-14 (×2): qty 1

## 2013-12-14 MED ORDER — ACETAMINOPHEN 650 MG RE SUPP: 650.0000 mg | RECTAL | Status: DC | PRN

## 2013-12-14 MED ORDER — ASPIRIN 300 MG RE SUPP: 300.0000 mg | Freq: Every day | RECTAL | Status: DC

## 2013-12-14 MED ORDER — LANTHANUM CARBONATE 500 MG PO CHEW
2000.0000 mg | CHEWABLE_TABLET | Freq: Three times a day (TID) | ORAL | Status: DC
  Administered 2013-12-15 – 2013-12-17 (×7): 2000 mg via ORAL
  Filled 2013-12-14 (×9): qty 4

## 2013-12-14 MED ORDER — METRONIDAZOLE 500 MG PO TABS
250.0000 mg | ORAL_TABLET | Freq: Three times a day (TID) | ORAL | Status: DC
  Administered 2013-12-14: 250 mg via ORAL
  Administered 2013-12-15: 20:00:00 via ORAL
  Administered 2013-12-15 – 2013-12-17 (×5): 250 mg via ORAL
  Filled 2013-12-14 (×9): qty 1

## 2013-12-14 MED ORDER — PANTOPRAZOLE SODIUM 40 MG PO TBEC
40.0000 mg | DELAYED_RELEASE_TABLET | Freq: Every day | ORAL | Status: DC
  Administered 2013-12-15 – 2013-12-17 (×3): 40 mg via ORAL
  Filled 2013-12-14 (×3): qty 1

## 2013-12-14 MED ORDER — ASPIRIN 325 MG PO TABS
325.0000 mg | ORAL_TABLET | Freq: Every day | ORAL | Status: DC
  Administered 2013-12-14 – 2013-12-17 (×4): 325 mg via ORAL
  Filled 2013-12-14 (×4): qty 1

## 2013-12-14 NOTE — ED Provider Notes (Signed)
CSN: [id number]     Arrival date & time 12/14/13  1215 History   First MD Initiated Contact with Patient 12/14/13 1218     Chief Complaint  Patient presents with  . Cerebrovascular Accident     (Consider location/radiation/quality/duration/timing/severity/associated sxs/prior Treatment) HPI Comments: Patient with hx of DM, ESRD on dialysis, and prior stroke, presents with confusion and slurred speech. Her niece states that she picked her up this morning about 8:30 and noticed that she was having some slurring of her speech and difficulty walking. She noted that she was wobbly. She also noted her to be confused. Patient states that she's had ongoing weakness for about 2 years on her right side from a prior stroke. She doesn't notice any appreciable difference today. Patient says she went to bed last night and she thinks that she was normal. The niece says the last time anyone actually saw her was 6:30 PM last night and she was at baseline at that time. Patient went to her primary care physician at [facility] and was sent over here for further evaluation.  She has dialysis Tues/Thurs/Sat and completed dialysis yesterday.  Patient is a 80 y.o. female presenting with Acute Neurological Problem.  Cerebrovascular Accident Pertinent negatives include no chest pain, no abdominal pain, no headaches and no shortness of breath.    Past Medical History  Diagnosis Date  . Diabetes mellitus   . Anemia   . Hyperparathyroidism   . Depression   . Obesity   . Osteoarthritis   . MRSA (methicillin resistant staph aureus) culture positive   . Gout   . C. difficile diarrhea   . Chronic kidney disease     on Dialysis  . Peripheral arterial disease   . Diabetic neuropathy   . GERD (gastroesophageal reflux disease)   . Stroke    Past Surgical History  Procedure Laterality Date  . Breast biopsy Right   . Shuntogram Right 09/06/12    Thigh graft  . Sp declot avgg Right Sep 14, 2012    Right thigh  graft   Family History  Problem Relation Age of Onset  . Cancer Father     type unknown  . Diabetes Sister   . Cancer Brother     type unknown   History  Substance Use Topics  . Smoking status: Never Smoker   . Smokeless tobacco: Never Used  . Alcohol Use: No   OB History   Grav Para Term Preterm Abortions TAB SAB Ect Mult Living                 Review of Systems  Constitutional: Negative for fever, chills, diaphoresis and fatigue.  HENT: Negative for congestion, rhinorrhea and sneezing.   Eyes: Negative.   Respiratory: Negative for cough, chest tightness and shortness of breath.   Cardiovascular: Negative for chest pain and leg swelling.  Gastrointestinal: Negative for nausea, vomiting, abdominal pain, diarrhea and blood in stool.  Genitourinary: Negative for frequency, hematuria, flank pain and difficulty urinating.  Musculoskeletal: Negative for arthralgias and back pain.  Skin: Negative for rash.  Neurological: Positive for speech difficulty and weakness. Negative for dizziness, numbness and headaches.      Allergies  Codeine and Vancomycin  Home Medications   Prior to Admission medications   Medication Sig Start Date End Date Taking? Authorizing Provider  acetaminophen (TYLENOL) 500 MG tablet Take 500 mg by mouth every 6 (six) hours as needed for pain.   Yes Historical Provider, MD  gabapentin (  NEURONTIN) 100 MG capsule Take 2 capsules by mouth daily. 10/10/13   Historical Provider, MD  metroNIDAZOLE (FLAGYL) 250 MG tablet Take 1 tablet (250 mg total) by mouth 3 (three) times daily. 12/05/13   Louis Meckel, MD  omeprazole (PRILOSEC) 20 MG capsule Take 1 capsule (20 mg total) by mouth daily. 11/28/13   Louis Meckel, MD  SENSIPAR 30 MG tablet Take 30 mg by mouth every evening.  04/13/13   Historical Provider, MD  sevelamer carbonate (RENVELA) 800 MG tablet Take 1,600-2,400 mg by mouth 5 (five) times daily. 3 tabs with meals and 2 with snacks    Historical Provider,  MD   BP 137/44  Pulse 79  Temp(Src) 97.8 F (36.6 C) (Oral)  Resp 12  Ht 5\' 1"  (1.549 m)  Wt 202 lb (91.627 kg)  BMI 38.19 kg/m2  SpO2 100% Physical Exam  Constitutional: She is oriented to person, place, and time. She appears well-developed and well-nourished.  HENT:  Head: Normocephalic and atraumatic.  Eyes: Pupils are equal, round, and reactive to light.  Neck: Normal range of motion. Neck supple.  Cardiovascular: Normal rate, regular rhythm and normal heart sounds.   Pulmonary/Chest: Effort normal and breath sounds normal. No respiratory distress. She has no wheezes. She has no rales. She exhibits no tenderness.  Abdominal: Soft. Bowel sounds are normal. There is no tenderness. There is no rebound and no guarding.  Musculoskeletal: Normal range of motion. She exhibits no edema.  Lymphadenopathy:    She has no cervical adenopathy.  Neurological: She is alert and oriented to person, place, and time.  Motor 4/5 in the right upper and lower extremity, 5 out of 5 in the left side. There is no arm drift. Finger to nose intact. There's some mild drooping of the right corner of the mouth with some numbness in the right side of face with for headaches.. No other cranial nerve deficits are noted.  Skin: Skin is warm and dry. No rash noted.  Psychiatric: She has a normal mood and affect.    ED Course  Procedures (including critical care time) Labs Review Results for orders placed during the hospital encounter of 12/14/13  ETHANOL      Result Value Ref Range   Alcohol, Ethyl (B) <11  0 - 11 mg/dL  PROTIME-INR      Result Value Ref Range   Prothrombin Time 12.7  11.6 - 15.2 seconds   INR 0.95  0.00 - 1.49  APTT      Result Value Ref Range   aPTT 34  24 - 37 seconds  CBC      Result Value Ref Range   WBC 11.2 (*) 4.0 - 10.5 K/uL   RBC 4.16  3.87 - 5.11 MIL/uL   Hemoglobin 10.0 (*) 12.0 - 15.0 g/dL   HCT 16.1 (*) 09.6 - 04.5 %   MCV 82.5  78.0 - 100.0 fL   MCH 24.0 (*) 26.0 -  34.0 pg   MCHC 29.2 (*) 30.0 - 36.0 g/dL   RDW 40.9 (*) 81.1 - 91.4 %   Platelets 237  150 - 400 K/uL  DIFFERENTIAL      Result Value Ref Range   Neutrophils Relative % 74  43 - 77 %   Lymphocytes Relative 18  12 - 46 %   Monocytes Relative 5  3 - 12 %   Eosinophils Relative 2  0 - 5 %   Basophils Relative 1  0 - 1 %  Neutro Abs 8.3 (*) 1.7 - 7.7 K/uL   Lymphs Abs 2.0  0.7 - 4.0 K/uL   Monocytes Absolute 0.6  0.1 - 1.0 K/uL   Eosinophils Absolute 0.2  0.0 - 0.7 K/uL   Basophils Absolute 0.1  0.0 - 0.1 K/uL   WBC Morphology MILD LEFT SHIFT (1-5% METAS, OCC MYELO, OCC BANDS)     Smear Review LARGE PLATELETS PRESENT    COMPREHENSIVE METABOLIC PANEL      Result Value Ref Range   Sodium 142  137 - 147 mEq/L   Potassium 4.2  3.7 - 5.3 mEq/L   Chloride 100  96 - 112 mEq/L   CO2 26  19 - 32 mEq/L   Glucose, Bld 98  70 - 99 mg/dL   BUN 19  6 - 23 mg/dL   Creatinine, Ser 4.09 (*) 0.50 - 1.10 mg/dL   Calcium 8.6  8.4 - 81.1 mg/dL   Total Protein 7.3  6.0 - 8.3 g/dL   Albumin 3.2 (*) 3.5 - 5.2 g/dL   AST 15  0 - 37 U/L   ALT 7  0 - 35 U/L   Alkaline Phosphatase 82  39 - 117 U/L   Total Bilirubin 0.4  0.3 - 1.2 mg/dL   GFR calc non Af Amer 6 (*) >90 mL/min   GFR calc Af Amer 7 (*) >90 mL/min   Anion gap 16 (*) 5 - 15  I-STAT CHEM 8, ED      Result Value Ref Range   Sodium 140  137 - 147 mEq/L   Potassium 3.9  3.7 - 5.3 mEq/L   Chloride 101  96 - 112 mEq/L   BUN 21  6 - 23 mg/dL   Creatinine, Ser 9.14 (*) 0.50 - 1.10 mg/dL   Glucose, Bld 782 (*) 70 - 99 mg/dL   Calcium, Ion 9.56 (*) 1.13 - 1.30 mmol/L   TCO2 26  0 - 100 mmol/L   Hemoglobin 12.2  12.0 - 15.0 g/dL   HCT 21.3  08.6 - 57.8 %  I-STAT TROPOININ, ED      Result Value Ref Range   Troponin i, poc 0.01  0.00 - 0.08 ng/mL   Comment 3            Ct Head Wo Contrast  12/14/2013   CLINICAL DATA:  Stroke.  EXAM: CT HEAD WITHOUT CONTRAST  TECHNIQUE: Contiguous axial images were obtained from the base of the skull through the  vertex without intravenous contrast.  COMPARISON:  None.  FINDINGS: No mass. No hydrocephalus. No hemorrhage. Diffuse cerebral and cerebellar atrophy. White matter changes noted consistent with chronic ischemia. No acute bony abnormality. Subtle lucencies noted throughout the calvarium. Metastatic disease or myeloma cannot be completely excluded. Chronic anemia cannot be excluded.  IMPRESSION: 1. Diffuse cerebral cerebellar atrophy. White matter changes consistent with chronic ischemia. No acute intracranial abnormality. 2. Subtle lucencies noted throughout the skull. Subtle changes of metastatic disease, myeloma, or chronic anemia cannot be excluded. Clinical correlation suggested.   Electronically Signed   By: Maisie Fus  Register   On: 12/14/2013 13:59      Imaging Review Ct Head Wo Contrast  12/14/2013   CLINICAL DATA:  Stroke.  EXAM: CT HEAD WITHOUT CONTRAST  TECHNIQUE: Contiguous axial images were obtained from the base of the skull through the vertex without intravenous contrast.  COMPARISON:  None.  FINDINGS: No mass. No hydrocephalus. No hemorrhage. Diffuse cerebral and cerebellar atrophy. White matter changes noted consistent with  chronic ischemia. No acute bony abnormality. Subtle lucencies noted throughout the calvarium. Metastatic disease or myeloma cannot be completely excluded. Chronic anemia cannot be excluded.  IMPRESSION: 1. Diffuse cerebral cerebellar atrophy. White matter changes consistent with chronic ischemia. No acute intracranial abnormality. 2. Subtle lucencies noted throughout the skull. Subtle changes of metastatic disease, myeloma, or chronic anemia cannot be excluded. Clinical correlation suggested.   Electronically Signed   By: Maisie Fushomas  Register   On: 12/14/2013 13:59     EKG Interpretation   Date/Time:  Friday December 14 2013 13:28:23 EDT Ventricular Rate:  75 PR Interval:  170 QRS Duration: 93 QT Interval:  417 QTC Calculation: 466 R Axis:   -56 Text Interpretation:  Age  not entered, assumed to be  80 years old for  purpose of ECG interpretation Sinus rhythm Atrial premature complex LAD,  consider LAFB or inferior infarct Low voltage, precordial leads Confirmed  by Keisy Strickler  MD, Odalis Jordan ([id number]) on 12/14/2013 1:41:48 PM      MDM   Final diagnoses:  None    Pt with right side weakness/numbness, unclear to to whether this is old or new, with slurred speech, ataxia.  CT neg, discussed with Dr. Amada JupiterKirkpatrick, will get MRI, he will come see pt.  Dr. Silverio LayYao to follow pending MRI and neuro consult  Rolan BuccoMelanie Javar Eshbach, MD 12/14/13 [phone]

## 2013-12-14 NOTE — Consult Note (Signed)
Referring Physician: Fredderick Phenix    Chief Complaint: Possible stroke  HPI:                                                                                                                                         [name] is an 80 y.o. female Who has known chronic right sided weakness.  Family states the weakness has been present since 2008-2009 and states she came to [place]. Due t electronic chart conversion I cannot find notes for her for stroke at that time. I also cannot find MRI at that time. Patient currently lives alone on a first floor apartment and cares for herself. Today she woke up and noted she felt off balance with her gait.  She denies leaning to one side or the other.  She also endorses right facial "hot sensation" along the V2-V3 region which then became decreased sensation.  Currently she still feels weak on the right arm and leg with decreased sensation on the right arm and leg (this is new). She also continues to feel decreased sensation on the right lower face.   She does not take ASA or Plavix.  She does note that she awoke at 4 AM until fine at that time after going to the bathroom. When she awoke at 8 AM was of balance.  Date last known well: 08/07 Time last known well: 4 am tPA Given: No: out of window  Past Medical History  Diagnosis Date  . Diabetes mellitus   . Anemia   . Hyperparathyroidism   . Depression   . Obesity   . Osteoarthritis   . MRSA (methicillin resistant staph aureus) culture positive   . Gout   . C. difficile diarrhea   . Chronic kidney disease     on Dialysis  . Peripheral arterial disease   . Diabetic neuropathy   . GERD (gastroesophageal reflux disease)   . Stroke     Past Surgical History  Procedure Laterality Date  . Breast biopsy Right   . Shuntogram Right 09/06/12    Thigh graft  . Sp declot avgg Right Sep 14, 2012    Right thigh graft    Family History  Problem Relation Age of Onset  . Cancer Father     type unknown   . Diabetes Sister   . Cancer Brother     type unknown   Social History:  reports that she has never smoked. She has never used smokeless tobacco. She reports that she does not drink alcohol or use illicit drugs.  Allergies:  Allergies  Allergen Reactions  . Codeine Hives  . Vancomycin Rash    Medications:  No current facility-administered medications for this encounter.   Current Outpatient Prescriptions  Medication Sig Dispense Refill  . acetaminophen (TYLENOL) 500 MG tablet Take 2,000 mg by mouth every 6 (six) hours as needed for moderate pain or headache.       . gabapentin (NEURONTIN) 100 MG capsule Take 100 mg by mouth 2 (two) times daily.       Marland Kitchen lanthanum (FOSRENOL) 1000 MG chewable tablet Chew 1,000-2,000 mg by mouth 3 (three) times daily with meals. Take 2000 mg by mouth 3 times daily with meals and take 1000mg  by mouth with snacks.      Melene Muller ON 01/05/2014] metroNIDAZOLE (FLAGYL) 250 MG tablet Take 250 mg by mouth 3 (three) times daily. For 14 days      . omeprazole (PRILOSEC) 20 MG capsule Take 1 capsule (20 mg total) by mouth daily.  90 capsule  3  . SENSIPAR 30 MG tablet Take 30 mg by mouth every evening.          ROS:                                                                                                                                       History obtained from the patient  General ROS: negative for - chills, fatigue, fever, night sweats, weight gain or weight loss Psychological ROS: negative for - behavioral disorder, hallucinations, memory difficulties, mood swings or suicidal ideation Ophthalmic ROS: negative for - blurry vision, double vision, eye pain or loss of vision ENT ROS: negative for - epistaxis, nasal discharge, oral lesions, sore throat, tinnitus or vertigo Allergy and Immunology ROS: negative for - hives or itchy/watery  eyes Hematological and Lymphatic ROS: negative for - bleeding problems, bruising or swollen lymph nodes Endocrine ROS: negative for - galactorrhea, hair pattern changes, polydipsia/polyuria or temperature intolerance Respiratory ROS: negative for - cough, hemoptysis, shortness of breath or wheezing Cardiovascular ROS: negative for - chest pain, dyspnea on exertion, edema or irregular heartbeat Gastrointestinal ROS: negative for - abdominal pain, diarrhea, hematemesis, nausea/vomiting or stool incontinence Genito-Urinary ROS: negative for - dysuria, hematuria, incontinence or urinary frequency/urgency Musculoskeletal ROS: negative for - joint swelling or muscular weakness Neurological ROS: as noted in HPI Dermatological ROS: negative for rash and skin lesion changes  Neurologic Examination:                                                                                                      Blood pressure 132/53, pulse 88, temperature  97.8 F (36.6 C), temperature source Oral, resp. rate 12, height 5\' 1"  (1.549 m), weight 91.627 kg (202 lb), SpO2 100.00%.   General: NAD Mental Status: Alert, oriented, thought content appropriate.  Speech fluent without evidence of aphasia.  Able to follow 3 step commands without difficulty. Cranial Nerves: II: Discs flat bilaterally; Visual fields grossly normal, pupils equal, round, reactive to light and accommodation III,IV, VI: ptosis not present, extra-ocular motions intact bilaterally V,VII: smile symmetric, facial light touch sensation decreased on the right lower face VIII: hearing normal bilaterally IX,X: gag reflex present XI: bilateral shoulder shrug XII: midline tongue extension without atrophy or fasciculations  Motor: Right : Upper extremity   4/5    Left:     Upper extremity   5/5  Lower extremity   4/5     Lower extremity   5/5 Tone and bulk:normal tone throughout; no atrophy noted Sensory: Pinprick and light touch decreased on the left  arm and leg Deep Tendon Reflexes:  Right: Upper Extremity   Left: Upper extremity   biceps (C-5 to C-6) 2/4   biceps (C-5 to C-6) 2/4 tricep (C7) 2/4    triceps (C7) 2/4 Brachioradialis (C6) 2/4  Brachioradialis (C6) 2/4  Lower Extremity Lower Extremity  quadriceps (L-2 to L-4) 1/4   quadriceps (L-2 to L-4) 1/4 Achilles (S1) 0/4   Achilles (S1) 0/4  Plantars: Mute bilaterally Cerebellar: normal finger-to-nose,  Ataxic right heel-to-shin test Gait: not tested due to multiple leads.  CV: pulses palpable throughout    Lab Results: Basic Metabolic Panel:  Recent Labs Lab 12/14/13 1253 12/14/13 1323  NA 142 140  K 4.2 3.9  CL 100 101  CO2 26  --   GLUCOSE 98 100*  BUN 19 21  CREATININE 6.26* 7.20*  CALCIUM 8.6  --     Liver Function Tests:  Recent Labs Lab 12/14/13 1253  AST 15  ALT 7  ALKPHOS 82  BILITOT 0.4  PROT 7.3  ALBUMIN 3.2*   No results found for this basename: LIPASE, AMYLASE,  in the last 168 hours No results found for this basename: AMMONIA,  in the last 168 hours  CBC:  Recent Labs Lab 12/14/13 1253 12/14/13 1323  WBC 11.2*  --   NEUTROABS 8.3*  --   HGB 10.0* 12.2  HCT 34.3* 36.0  MCV 82.5  --   PLT 237  --     Cardiac Enzymes: No results found for this basename: CKTOTAL, CKMB, CKMBINDEX, TROPONINI,  in the last 168 hours  Lipid Panel: No results found for this basename: CHOL, TRIG, HDL, CHOLHDL, VLDL, LDLCALC,  in the last 168 hours  CBG: No results found for this basename: GLUCAP,  in the last 168 hours  Microbiology: Results for orders placed in visit on 11/29/13  CLOSTRIDIUM DIFFICILE BY PCR     Status: Abnormal   Collection Time    11/29/13  1:44 PM      Result Value Ref Range Status   C difficile by pcr Detected (*) Not Detected Final   Comment: This test is for use only with liquid or soft stools;     performance characteristics of other clinical specimen     types have not been established.           This assay was  performed by [employer].     The performance characteristics of this assay have     been determined by [facility]. Performance  characteristics refer to the analytical performance     of the test.    Coagulation Studies:  Recent Labs  12/14/13 1253  LABPROT 12.7  INR 0.95    Imaging: Ct Head Wo Contrast  12/14/2013   CLINICAL DATA:  Stroke.  EXAM: CT HEAD WITHOUT CONTRAST  TECHNIQUE: Contiguous axial images were obtained from the base of the skull through the vertex without intravenous contrast.  COMPARISON:  None.  FINDINGS: No mass. No hydrocephalus. No hemorrhage. Diffuse cerebral and cerebellar atrophy. White matter changes noted consistent with chronic ischemia. No acute bony abnormality. Subtle lucencies noted throughout the calvarium. Metastatic disease or myeloma cannot be completely excluded. Chronic anemia cannot be excluded.  IMPRESSION: 1. Diffuse cerebral cerebellar atrophy. White matter changes consistent with chronic ischemia. No acute intracranial abnormality. 2. Subtle lucencies noted throughout the skull. Subtle changes of metastatic disease, myeloma, or chronic anemia cannot be excluded. Clinical correlation suggested.   Electronically Signed   By: Maisie Fus  Register   On: 12/14/2013 13:59       Assessment and plan discussed with with attending physician and they are in agreement.    Felicie Morn PA-C [facility] [phone]  12/14/2013, 2:58 PM   Assessment: 80 y.o. female with known chronic right sided weakness presenting to ED with increased right sided weakness and new decreased sensation on the right face, arm and leg. With new onset decreased sensation with increased weakness, high suspicion for stroke  Stroke Risk Factors - diabetes mellitus  Recommend: 1. HgbA1c, fasting lipid panel 2. MRI, MRA  of the brain without contrast 3. Frequent neuro checks 4. Echocardiogram 5. Carotid dopplers 6. Prophylactic  therapy-Antiplatelet med: Aspirin - dose 325mg  PO or 300mg  PR 7. Risk factor modification 8. Telemetry monitoring 9. PT consult, OT consult, Speech consult  Ritta Slot, MD [facility] [phone]  If 7pm- 7am, please page neurology on call as listed in [facility]ION.

## 2013-12-14 NOTE — ED Notes (Signed)
Neuro at bedside.

## 2013-12-14 NOTE — ED Notes (Signed)
EMS - Pt coming from doctors office with c/o for stroke like symptoms.  Pt states she is unsure what time she went to bed last night but knows it was before midnight.  Pt niece noticed concerning changes with pt this morning at 8:30 and took pt to doctors office.  Pt reports having chronic right sided weakness for a couple of years.  Pt reports having a new flushed feeling on the right side of her face.  Pt recieves dialysis Tuesdays, Thursdays and Saturdays.  Denies pain.

## 2013-12-14 NOTE — ED Notes (Signed)
Pt undressed, in gown, on monitor, continuous pulse oximetry and blood pressure cuff 

## 2013-12-14 NOTE — ED Notes (Signed)
Neurology at bedside.

## 2013-12-14 NOTE — H&P (Signed)
History and Physical  Jamie LippsBetty G [name] NWG:[id number]:[id number] DOB: 07/03/1932 DOA: 12/14/2013  Referring physician: Dr. Chaney Mallingavid Yao, EDP PCP: Emeterio ReeveWOLTERS,Jamie A, MD  Outpatient Specialists:  1. Nephrology: Dr. Carlena BjornstadJoseph [name] 2. Vascular: Dr. Nanetta BattyJonathan [name]. 3. Podiatry: [facility]  Chief Complaint: Right-sided weakness and numbness.  HPI: [name] is a 80 y.o. female with history of DM 2 with renal complications and peripheral neuropathy, ESRD on TTS hemodialysis, prior stroke (patient/family unaware that she had a stroke) with residual right-sided weakness, secondary hyperparathyroidism, gout, PAD, GERD, presented to the ED with complaints of unsteady gait and right-sided weakness. Patient went to bed on 12/13/13 without complaints. At 8:30 AM on 12/14/13, patient's niece noticed that she was having slurred speech, unsteady gait, right-sided weakness and confusion. Patient states that she has mild right-sided weakness since 2008-2009 but noticed worsened weakness and hot sensation and numbness across the right angle of the mouth and right side of jaw. She apparently went to her PCP and was sent to the ED for further evaluation. In the ED, MRI brain confirms CVA. Neurology has consulted. Hospitalist admission requested.  Review of Systems: All systems reviewed and apart from history of presenting illness, are negative. She has darkish discolored left great toe for a long time, with intermittent draining of clear/bloody fluid and intermittent pain but no fevers. She states that she follows with podiatry and vascular.  Past Medical History  Diagnosis Date  . Diabetes mellitus   . Anemia   . Hyperparathyroidism   . Depression   . Obesity   . Osteoarthritis   . MRSA (methicillin resistant staph aureus) culture positive   . Gout   . C. difficile diarrhea   . Chronic kidney disease     on Dialysis  . Peripheral arterial disease   . Diabetic neuropathy   . GERD (gastroesophageal reflux  disease)   . Stroke    Past Surgical History  Procedure Laterality Date  . Breast biopsy Right   . Shuntogram Right 09/06/12    Thigh graft  . Sp declot avgg Right Sep 14, 2012    Right thigh graft   Social History:  reports that she has never smoked. She has never used smokeless tobacco. She reports that she does not drink alcohol or use illicit drugs. Single. Lives alone. Independent of activities of daily living. Does not use a cane or a walker.  Allergies  Allergen Reactions  . Codeine Hives  . Vancomycin Rash    Family History  Problem Relation Age of Onset  . Cancer Father     type unknown  . Diabetes Sister   . Cancer Brother     type unknown    Prior to Admission medications   Medication Sig Start Date End Date Taking? Authorizing Provider  gabapentin (NEURONTIN) 100 MG capsule Take 100 mg by mouth 2 (two) times daily.  10/10/13  Yes Historical Provider, MD  lanthanum (FOSRENOL) 1000 MG chewable tablet Chew 1,000-2,000 mg by mouth 3 (three) times daily with meals. Take 2000 mg by mouth 3 times daily with meals and take 1000mg  by mouth with snacks.   Yes Historical Provider, MD  metroNIDAZOLE (FLAGYL) 250 MG tablet Take 250 mg by mouth 3 (three) times daily. For 14 days 01/05/14  Yes Historical Provider, MD  omeprazole (PRILOSEC) 20 MG capsule Take 1 capsule (20 mg total) by mouth daily. 11/28/13  Yes Louis Meckelobert D Kaplan, MD  SENSIPAR 30 MG tablet Take 30 mg by mouth every  evening.  04/13/13  Yes Historical Provider, MD   Physical Exam: Filed Vitals:   12/14/13 1330 12/14/13 1422 12/14/13 1427 12/14/13 1445  BP:  137/44  132/53  Pulse: 78 79  88  Temp:   97.8 F (36.6 C)   TempSrc:      Resp:  12    Height:      Weight:      SpO2: 99% 100%  100%     General exam: Moderately built and nourished pleasant elderly female patient, lying comfortably supine on the gurney in no obvious distress.  Head, eyes and ENT: Nontraumatic and normocephalic. Pupils equally reacting to  light and accommodation. Bilateral mature cataracts. Oral mucosa moist.  Neck: Supple. No JVD, carotid bruit or thyromegaly.  Lymphatics: No lymphadenopathy.  Respiratory system: Clear to auscultation. No increased work of breathing.  Cardiovascular system: S1 and S2 heard, RRR. No JVD, murmurs, gallops, clicks or pedal edema.  Gastrointestinal system: Abdomen is nondistended, soft and nontender. Normal bowel sounds heard. No organomegaly or masses appreciated.  Central nervous system: Alert and oriented. No obvious cranial nerve deficits except for diminished sensation of right lower face next to angle of mouth.  Extremities: 5 x 5 power in left limbs and grade 4+/5 power in right limbs without pronator drift. Dark discoloration with thickened/shriveled skin of the entire right great toe with tiny open clean wound under nail bed with no current drainage, nontender or fluctuance. Thrill of AV fistula in right mid thigh. Absent left dorsalis pedis pulsation and feeble right dorsalis pedis.  Skin: No rashes or acute findings.  Musculoskeletal system: Negative exam.  Psychiatry: Pleasant and cooperative.   Labs on Admission:  Basic Metabolic Panel:  Recent Labs Lab 12/14/13 1253 12/14/13 1323  NA 142 140  K 4.2 3.9  CL 100 101  CO2 26  --   GLUCOSE 98 100*  BUN 19 21  CREATININE 6.26* 7.20*  CALCIUM 8.6  --    Liver Function Tests:  Recent Labs Lab 12/14/13 1253  AST 15  ALT 7  ALKPHOS 82  BILITOT 0.4  PROT 7.3  ALBUMIN 3.2*   No results found for this basename: LIPASE, AMYLASE,  in the last 168 hours No results found for this basename: AMMONIA,  in the last 168 hours CBC:  Recent Labs Lab 12/14/13 1253 12/14/13 1323  WBC 11.2*  --   NEUTROABS 8.3*  --   HGB 10.0* 12.2  HCT 34.3* 36.0  MCV 82.5  --   PLT 237  --    Cardiac Enzymes: No results found for this basename: CKTOTAL, CKMB, CKMBINDEX, TROPONINI,  in the last 168 hours  BNP (last 3 results) No  results found for this basename: PROBNP,  in the last 8760 hours CBG: No results found for this basename: GLUCAP,  in the last 168 hours  Radiological Exams on Admission: Ct Head Wo Contrast  12/14/2013   CLINICAL DATA:  Stroke.  EXAM: CT HEAD WITHOUT CONTRAST  TECHNIQUE: Contiguous axial images were obtained from the base of the skull through the vertex without intravenous contrast.  COMPARISON:  None.  FINDINGS: No mass. No hydrocephalus. No hemorrhage. Diffuse cerebral and cerebellar atrophy. White matter changes noted consistent with chronic ischemia. No acute bony abnormality. Subtle lucencies noted throughout the calvarium. Metastatic disease or myeloma cannot be completely excluded. Chronic anemia cannot be excluded.  IMPRESSION: 1. Diffuse cerebral cerebellar atrophy. White matter changes consistent with chronic ischemia. No acute intracranial abnormality. 2. Subtle lucencies  noted throughout the skull. Subtle changes of metastatic disease, myeloma, or chronic anemia cannot be excluded. Clinical correlation suggested.   Electronically Signed   By: Maisie Fus  Register   On: 12/14/2013 13:59   Mr Maxine Glenn Head Wo Contrast  12/14/2013   CLINICAL DATA:  New right-sided weakness.  EXAM: MRI HEAD WITHOUT CONTRAST  MRA HEAD WITHOUT CONTRAST  TECHNIQUE: Multiplanar, multiecho pulse sequences of the brain and surrounding structures were obtained without intravenous contrast. Angiographic images of the head were obtained using MRA technique without contrast.  COMPARISON:  CT head without contrast from the same day.  FINDINGS: MRI HEAD FINDINGS  A 4.5 mm focus of restricted diffusion is present in the lateral left thalamus or posterior limb internal capsule. Associated T2 changes are noted. Additional remote lacunar infarcts are present in the thalami bilaterally. Extensive periventricular and subcortical white matter change is present bilaterally. White matter changes extend into the brainstem. Extensive remote  infarcts are present within the right cerebellum and brachium pontis.  Flow is present in the major intracranial arteries. The globes and orbits are intact. The paranasal sinuses and mastoid air cells are clear.  MRA HEAD FINDINGS  Tortuosity is present in the cervical left internal carotid artery. There is mild atherosclerotic changes within the cavernous segments bilaterally. No focal stenosis is present. The A1 and M1 segments are normal. The anterior communicating artery is patent. The MCA bifurcations are intact. There is segmental attenuation of MCA branch vessels bilaterally.  The left vertebral artery is the dominant vessel. Atherosclerotic irregularity is present in the distal right vertebral artery without significant stenosis. The PICA origins are visualized bilaterally. The basilar artery is normal. Both posterior cerebral arteries originate from the basilar tip. There is moderate attenuation of distal PCA branch vessels bilaterally.  IMPRESSION: 1. Acute 4.5 mm nonhemorrhagic infarct within the lateral left thalamus were posterior limb internal capsule. 2. Extensive periventricular and subcortical white matter change bilaterally is likely related to microvascular ischemic change. 3. Remote lacunar infarcts of the thalami bilaterally. 4. Remote infarcts of the right cerebellum and brachium pontis. 5. The MRA demonstrates mild atherosclerotic changes within the cavernous carotid arteries and distal right vertebral artery without focal stenosis. 6. Moderate medium and small vessel disease in both the anterior and posterior circulation.   Electronically Signed   By: Gennette Pac M.D.   On: 12/14/2013 16:35   Mr Brain Wo Contrast  12/14/2013   CLINICAL DATA:  New right-sided weakness.  EXAM: MRI HEAD WITHOUT CONTRAST  MRA HEAD WITHOUT CONTRAST  TECHNIQUE: Multiplanar, multiecho pulse sequences of the brain and surrounding structures were obtained without intravenous contrast. Angiographic images of the  head were obtained using MRA technique without contrast.  COMPARISON:  CT head without contrast from the same day.  FINDINGS: MRI HEAD FINDINGS  A 4.5 mm focus of restricted diffusion is present in the lateral left thalamus or posterior limb internal capsule. Associated T2 changes are noted. Additional remote lacunar infarcts are present in the thalami bilaterally. Extensive periventricular and subcortical white matter change is present bilaterally. White matter changes extend into the brainstem. Extensive remote infarcts are present within the right cerebellum and brachium pontis.  Flow is present in the major intracranial arteries. The globes and orbits are intact. The paranasal sinuses and mastoid air cells are clear.  MRA HEAD FINDINGS  Tortuosity is present in the cervical left internal carotid artery. There is mild atherosclerotic changes within the cavernous segments bilaterally. No focal stenosis is present. The A1  and M1 segments are normal. The anterior communicating artery is patent. The MCA bifurcations are intact. There is segmental attenuation of MCA branch vessels bilaterally.  The left vertebral artery is the dominant vessel. Atherosclerotic irregularity is present in the distal right vertebral artery without significant stenosis. The PICA origins are visualized bilaterally. The basilar artery is normal. Both posterior cerebral arteries originate from the basilar tip. There is moderate attenuation of distal PCA branch vessels bilaterally.  IMPRESSION: 1. Acute 4.5 mm nonhemorrhagic infarct within the lateral left thalamus were posterior limb internal capsule. 2. Extensive periventricular and subcortical white matter change bilaterally is likely related to microvascular ischemic change. 3. Remote lacunar infarcts of the thalami bilaterally. 4. Remote infarcts of the right cerebellum and brachium pontis. 5. The MRA demonstrates mild atherosclerotic changes within the cavernous carotid arteries and  distal right vertebral artery without focal stenosis. 6. Moderate medium and small vessel disease in both the anterior and posterior circulation.   Electronically Signed   By: Gennette Pac M.D.   On: 12/14/2013 16:35    EKG: Independently reviewed. Sinus rhythm, LAD and Q waves in inferior leads. No acute changes.  Assessment/Plan Principal Problem:   CVA (cerebral infarction) Active Problems:   ESRD on dialysis   Diabetes mellitus   Anemia   Peripheral arterial disease   1. Acute left brain CVA: Likely secondary to small vessel disease. CVA risk factors: Diabetes, ESRD. Patient with history of prior right-sided weakness most likely from stroke. Not on antiplatelets prior to admission. No TPA due to unknown time of onset. Admit to telemetry. Complete stroke workup-hemoglobin A1c, fasting lipids, carotid Dopplers and 2-D echo. PT, OT and ST evaluation. Start aspirin 325 mg daily for secondary stroke prophylaxis. Neurology consultation appreciated. 2. ESRD on TTS hemodialysis: Nephrology informed of admission for scheduled dialysis in a.m. No indications for urgent dialysis. 3. Anemia: Likely secondary to chronic kidney disease. Stable. 4. PAD with dry gangrene versus chronic osteomyelitis of the left first toe: Check x-ray of left foot. Outpatient follow up with [facility] and vascular. Aspirin. Unclear why she is on Flagyl. 5. Type II DM: Not on medications at home. Check CBGs and SSI if needed. 6. Secondary hyperparathyroidism: Continue home medications. 7. Skull lucencies on CT head:? Secondary to anemia versus hyperparathyroidism versus other etiologies. Outpatient evaluation and followup. 8. History of GERD: Continue PPI.     Code Status: Full  Family Communication: Discussed with patient's nephew and niece at bedside.  Disposition Plan: Home when medically stable.   Time spent: 60 minutes  Betzabeth Derringer, MD, FACP, FHM. [facility] Pager [phone]  If 7PM-7AM,  please contact night-coverage [email or web address] Password [email or web address] 12/14/2013, 5:25 PM

## 2013-12-14 NOTE — ED Notes (Signed)
Niece states pt was really wobbly this morning and having trouble getting her words out, garbled.  Pt denies having a headache.

## 2013-12-15 DIAGNOSIS — I059 Rheumatic mitral valve disease, unspecified: Secondary | ICD-10-CM

## 2013-12-15 DIAGNOSIS — I6320 Cerebral infarction due to unspecified occlusion or stenosis of unspecified precerebral arteries: Secondary | ICD-10-CM

## 2013-12-15 LAB — LIPID PANEL
CHOL/HDL RATIO: 3.6 ratio
CHOLESTEROL: 127 mg/dL (ref 0–200)
HDL: 35 mg/dL — AB (ref >39)
LDL Cholesterol: 44 mg/dL (ref 0–99)
Triglycerides: 241 mg/dL — ABNORMAL HIGH (ref <150)
VLDL: 48 mg/dL — AB (ref 0–40)

## 2013-12-15 LAB — GLUCOSE, CAPILLARY
GLUCOSE-CAPILLARY: 131 mg/dL — AB (ref 70–99)
GLUCOSE-CAPILLARY: 105 mg/dL — AB (ref 70–99)
Glucose-Capillary: 185 mg/dL — ABNORMAL HIGH (ref 70–99)

## 2013-12-15 LAB — HEMOGLOBIN A1C
Hgb A1c MFr Bld: 5.8 % — ABNORMAL HIGH (ref <5.7)
MEAN PLASMA GLUCOSE: 120 mg/dL — AB (ref <117)

## 2013-12-15 MED ORDER — DOXERCALCIFEROL 4 MCG/2ML IV SOLN
3.0000 ug | INTRAVENOUS | Status: DC
  Administered 2013-12-15: 3 ug via INTRAVENOUS
  Filled 2013-12-15: qty 2

## 2013-12-15 MED ORDER — RENA-VITE PO TABS
1.0000 | ORAL_TABLET | Freq: Every day | ORAL | Status: DC
  Administered 2013-12-15 – 2013-12-16 (×2): 1 via ORAL
  Filled 2013-12-15 (×2): qty 1

## 2013-12-15 MED ORDER — DOXERCALCIFEROL 4 MCG/2ML IV SOLN
INTRAVENOUS | Status: AC
  Filled 2013-12-15: qty 2

## 2013-12-15 NOTE — Progress Notes (Signed)
Per Onalee Huaavid Neuro PA, if patient is to receive HD this morning, hold ASA until afterwards. If patient wont be having HD until this afternoon, we may give ASA now  -Called HD and they said patient will be picked up "second round" today probably around 12-1pm. I will hold ASA and reschedule for this evening

## 2013-12-15 NOTE — Consult Note (Signed)
[facility] Renal Consultation Note    Indication for Consultation:  Management of ESRD/hemodialysis; anemia, hypertension/volume and secondary hyperparathyroidism  HPI: [name] is a pleasant  80 y.o. female with past medical history significant for DM, PAD and prior CVA who states she awoke yesterday morning and was preparing to go grocery shopping and noticed that she felt "off balance". (Lives alone and performs her own ADLs). She also noted some weakness in her right upper and lower extremities as well as decreased sensation to her right face. She presented to the the ED at the urging of a family member who also noted slurring of her speech. Upon admission, MRI/MRA brain was significant for an acute left non-hemorrhagic CVA. She has no other emerging complaints at this time and is anxious to go to dialysis and get back to her room before her family arrives.  Past Medical History  Diagnosis Date  . Diabetes mellitus   . Anemia   . Hyperparathyroidism   . Depression   . Obesity   . Osteoarthritis   . MRSA (methicillin resistant staph aureus) culture positive   . Gout   . C. difficile diarrhea   . Chronic kidney disease     on Dialysis  . Peripheral arterial disease   . Diabetic neuropathy   . GERD (gastroesophageal reflux disease)   . Stroke    Past Surgical History  Procedure Laterality Date  . Breast biopsy Right   . Shuntogram Right 09/06/12    Thigh graft  . Sp declot avgg Right Sep 14, 2012    Right thigh graft   Family History  Problem Relation Age of Onset  . Cancer Father     type unknown  . Diabetes Sister   . Cancer Brother     type unknown   Social History:  reports that she has never smoked. She has never used smokeless tobacco. She reports that she does not drink alcohol or use illicit drugs. Allergies  Allergen Reactions  . Codeine Hives  . Vancomycin Rash   Prior to Admission medications   Medication Sig Start Date End Date  Taking? Authorizing Provider  gabapentin (NEURONTIN) 100 MG capsule Take 100 mg by mouth 2 (two) times daily.  10/10/13  Yes Historical Provider, MD  lanthanum (FOSRENOL) 1000 MG chewable tablet Chew 1,000-2,000 mg by mouth 3 (three) times daily with meals. Take 2000 mg by mouth 3 times daily with meals and take 1000mg  by mouth with snacks.   Yes Historical Provider, MD  metroNIDAZOLE (FLAGYL) 250 MG tablet Take 250 mg by mouth 3 (three) times daily. For 14 days 01/05/14  Yes Historical Provider, MD  omeprazole (PRILOSEC) 20 MG capsule Take 1 capsule (20 mg total) by mouth daily. 11/28/13  Yes Louis Meckel, MD  SENSIPAR 30 MG tablet Take 30 mg by mouth every evening.  04/13/13  Yes Historical Provider, MD   Current Facility-Administered Medications  Medication Dose Route Frequency Provider Last Rate Last Dose  . acetaminophen (TYLENOL) tablet 650 mg  650 mg Oral Q4H PRN Elease Etienne, MD       Or  . acetaminophen (TYLENOL) suppository 650 mg  650 mg Rectal Q4H PRN Elease Etienne, MD      . aspirin suppository 300 mg  300 mg Rectal Daily Elease Etienne, MD       Or  . aspirin tablet 325 mg  325 mg Oral Daily Elease Etienne, MD   325 mg at 12/14/13  2147  . cinacalcet (SENSIPAR) tablet 30 mg  30 mg Oral QPM Elease Etienne, MD   30 mg at 12/14/13 2147  . doxercalciferol (HECTOROL) injection 3 mcg  3 mcg Intravenous Q T,Th,Sa-HD Kerin Salen, PA-C      . gabapentin (NEURONTIN) capsule 100 mg  100 mg Oral BID Elease Etienne, MD   100 mg at 12/14/13 2147  . heparin injection 5,000 Units  5,000 Units Subcutaneous 3 times per day Elease Etienne, MD   5,000 Units at 12/15/13 0605  . lanthanum (FOSRENOL) chewable tablet 1,000 mg  1,000 mg Oral TID PRN Elease Etienne, MD      . lanthanum [facility]) chewable tablet 2,000 mg  2,000 mg Oral TID WC Elease Etienne, MD   2,000 mg at 12/15/13 0750  . metroNIDAZOLE (FLAGYL) tablet 250 mg  250 mg Oral TID Elease Etienne, MD   250 mg at 12/14/13  2147  . pantoprazole (PROTONIX) EC tablet 40 mg  40 mg Oral Daily Elease Etienne, MD       Labs: Basic Metabolic Panel:  Recent Labs Lab 12/14/13 1253 12/14/13 1323  NA 142 140  K 4.2 3.9  CL 100 101  CO2 26  --   GLUCOSE 98 100*  BUN 19 21  CREATININE 6.26* 7.20*  CALCIUM 8.6  --    Liver Function Tests:  Recent Labs Lab 12/14/13 1253  AST 15  ALT 7  ALKPHOS 82  BILITOT 0.4  PROT 7.3  ALBUMIN 3.2*   No results found for this basename: LIPASE, AMYLASE,  in the last 168 hours No results found for this basename: AMMONIA,  in the last 168 hours CBC:  Recent Labs Lab 12/14/13 1253 12/14/13 1323  WBC 11.2*  --   NEUTROABS 8.3*  --   HGB 10.0* 12.2  HCT 34.3* 36.0  MCV 82.5  --   PLT 237  --     Recent Labs Lab 12/14/13 1902 12/14/13 2108 12/15/13 0646  GLUCAP 78 120* 131*   Studies/Results: Dg Chest 2 View  12/14/2013   CLINICAL DATA:  Stroke  EXAM: CHEST  2 VIEW  COMPARISON:  11/02/2012  FINDINGS: Limited inspiratory effect. Stable elevated right diaphragm. Mild cardiac enlargement, which is stable. Vascular pattern is normal. Mild discoid atelectasis in the lingula. No pleural effusions.  IMPRESSION: No significant acute findings.   Electronically Signed   By: Esperanza Heir M.D.   On: 12/14/2013 20:50   Ct Head Wo Contrast  12/14/2013   CLINICAL DATA:  Stroke.  EXAM: CT HEAD WITHOUT CONTRAST  TECHNIQUE: Contiguous axial images were obtained from the base of the skull through the vertex without intravenous contrast.  COMPARISON:  None.  FINDINGS: No mass. No hydrocephalus. No hemorrhage. Diffuse cerebral and cerebellar atrophy. White matter changes noted consistent with chronic ischemia. No acute bony abnormality. Subtle lucencies noted throughout the calvarium. Metastatic disease or myeloma cannot be completely excluded. Chronic anemia cannot be excluded.  IMPRESSION: 1. Diffuse cerebral cerebellar atrophy. White matter changes consistent with chronic  ischemia. No acute intracranial abnormality. 2. Subtle lucencies noted throughout the skull. Subtle changes of metastatic disease, myeloma, or chronic anemia cannot be excluded. Clinical correlation suggested.   Electronically Signed   By: Maisie Fus  Register   On: 12/14/2013 13:59   Mr Maxine Glenn Head Wo Contrast  12/14/2013   CLINICAL DATA:  New right-sided weakness.  EXAM: MRI HEAD WITHOUT CONTRAST  MRA HEAD WITHOUT CONTRAST  TECHNIQUE: Multiplanar,  multiecho pulse sequences of the brain and surrounding structures were obtained without intravenous contrast. Angiographic images of the head were obtained using MRA technique without contrast.  COMPARISON:  CT head without contrast from the same day.  FINDINGS: MRI HEAD FINDINGS  A 4.5 mm focus of restricted diffusion is present in the lateral left thalamus or posterior limb internal capsule. Associated T2 changes are noted. Additional remote lacunar infarcts are present in the thalami bilaterally. Extensive periventricular and subcortical white matter change is present bilaterally. White matter changes extend into the brainstem. Extensive remote infarcts are present within the right cerebellum and brachium pontis.  Flow is present in the major intracranial arteries. The globes and orbits are intact. The paranasal sinuses and mastoid air cells are clear.  MRA HEAD FINDINGS  Tortuosity is present in the cervical left internal carotid artery. There is mild atherosclerotic changes within the cavernous segments bilaterally. No focal stenosis is present. The A1 and M1 segments are normal. The anterior communicating artery is patent. The MCA bifurcations are intact. There is segmental attenuation of MCA branch vessels bilaterally.  The left vertebral artery is the dominant vessel. Atherosclerotic irregularity is present in the distal right vertebral artery without significant stenosis. The PICA origins are visualized bilaterally. The basilar artery is normal. Both posterior  cerebral arteries originate from the basilar tip. There is moderate attenuation of distal PCA branch vessels bilaterally.  IMPRESSION: 1. Acute 4.5 mm nonhemorrhagic infarct within the lateral left thalamus were posterior limb internal capsule. 2. Extensive periventricular and subcortical white matter change bilaterally is likely related to microvascular ischemic change. 3. Remote lacunar infarcts of the thalami bilaterally. 4. Remote infarcts of the right cerebellum and brachium pontis. 5. The MRA demonstrates mild atherosclerotic changes within the cavernous carotid arteries and distal right vertebral artery without focal stenosis. 6. Moderate medium and small vessel disease in both the anterior and posterior circulation.   Electronically Signed   By: Gennette Pac M.D.   On: 12/14/2013 16:35   Mr Brain Wo Contrast  12/14/2013   CLINICAL DATA:  New right-sided weakness.  EXAM: MRI HEAD WITHOUT CONTRAST  MRA HEAD WITHOUT CONTRAST  TECHNIQUE: Multiplanar, multiecho pulse sequences of the brain and surrounding structures were obtained without intravenous contrast. Angiographic images of the head were obtained using MRA technique without contrast.  COMPARISON:  CT head without contrast from the same day.  FINDINGS: MRI HEAD FINDINGS  A 4.5 mm focus of restricted diffusion is present in the lateral left thalamus or posterior limb internal capsule. Associated T2 changes are noted. Additional remote lacunar infarcts are present in the thalami bilaterally. Extensive periventricular and subcortical white matter change is present bilaterally. White matter changes extend into the brainstem. Extensive remote infarcts are present within the right cerebellum and brachium pontis.  Flow is present in the major intracranial arteries. The globes and orbits are intact. The paranasal sinuses and mastoid air cells are clear.  MRA HEAD FINDINGS  Tortuosity is present in the cervical left internal carotid artery. There is mild  atherosclerotic changes within the cavernous segments bilaterally. No focal stenosis is present. The A1 and M1 segments are normal. The anterior communicating artery is patent. The MCA bifurcations are intact. There is segmental attenuation of MCA branch vessels bilaterally.  The left vertebral artery is the dominant vessel. Atherosclerotic irregularity is present in the distal right vertebral artery without significant stenosis. The PICA origins are visualized bilaterally. The basilar artery is normal. Both posterior cerebral arteries originate from the  basilar tip. There is moderate attenuation of distal PCA branch vessels bilaterally.  IMPRESSION: 1. Acute 4.5 mm nonhemorrhagic infarct within the lateral left thalamus were posterior limb internal capsule. 2. Extensive periventricular and subcortical white matter change bilaterally is likely related to microvascular ischemic change. 3. Remote lacunar infarcts of the thalami bilaterally. 4. Remote infarcts of the right cerebellum and brachium pontis. 5. The MRA demonstrates mild atherosclerotic changes within the cavernous carotid arteries and distal right vertebral artery without focal stenosis. 6. Moderate medium and small vessel disease in both the anterior and posterior circulation.   Electronically Signed   By: Gennette Pachris  Mattern M.D.   On: 12/14/2013 16:35   Dg Foot Complete Left  12/14/2013   CLINICAL DATA:  Left foot pain, diabetes, chronic osteomyelitis versus dry gangrene left great toe  EXAM: LEFT FOOT - COMPLETE 3+ VIEW  COMPARISON:  None.  FINDINGS: Diffuse osteopenia. Extensive small vessel calcification. There is air in the soft tissues at the tip of the great toe. There are areas with a cortex of the tip of the distal phalanx is indistinct. There is a possible fracture involving the base of the third proximal phalanx, but the margins are sclerotic suggesting that it is not acute.  IMPRESSION: Equivocal findings suggesting the possibility of  osteomyelitis of the first distal phalanx in the setting of air in the surrounding soft tissues. Consider further evaluation with MRI.   Electronically Signed   By: Esperanza Heiraymond  Rubner M.D.   On: 12/14/2013 20:54    ROS: Rt side weakness. 10 pt ROS asked and answered. All other systems negative except as above. Feeling a "hot flashing feeling" in her right face as she did at admission.   Physical Exam: Filed Vitals:   12/15/13 0006 12/15/13 0200 12/15/13 0400 12/15/13 0600  BP: 104/41 101/43 111/42 102/40  Pulse:   92 68  Temp: 98.1 F (36.7 C) 98.4 F (36.9 C) 98.2 F (36.8 C) 98 F (36.7 C)  TempSrc: Oral Oral Oral Oral  Resp: 16 14 14 14   Height:      Weight:      SpO2: 95% 94% 99% 97%     General: Elderly, well developed, well nourished, in no acute distress. Head: Normocephalic, atraumatic, sclera non-icteric, mucus membranes are moist Neck: Supple. JVD not elevated. Lungs: Clear bilaterally to auscultation without wheezes, rales, or rhonchi. Breathing is unlabored. Heart: RRR with S1 S2. No murmurs, rubs, or gallops appreciated. Abdomen: Soft, non-tender, non-distended with normoactive bowel sounds. No rebound/guarding. No obvious abdominal masses. M-S:  Strength and tone appear normal for age. Lower extremities: trace LE edema bilat, ischemic changes;   Neuro: Alert and oriented X 3. Moves all extremities spontaneously. Rt side facial droop Psych:  Responds to questions appropriately with a normal affect. Dialysis Access: Rt fem AVG + bruit  Dialysis Orders: Center:[facility]  on TTS . EDW 90kg HD Bath 2K/2.25Ca  Time 4hg Heparin 5000 bolus/2000 mid. Access Rt fem AVG 450/800    Hectorol 3 mcg IV/HD Aranesp 60 q Thurs   Venofer 0 Recent labs: Hgb 9.3. Tsat 25%. Ferr 1298. Phos 8.8, PTH 180  Assessment/Plan: 1. Acute left non-hemorrhagic CVA (on a background of probable old strokes to which pt had adapted over time) - Mgmt per neuro. Antiplatelet therapy. OT,PT,speech 2. ESRD -   TTS, HD today 3. Hypertension/volume  - SBPs 100s-110s. Up ~2kg. Trace LE edema. CXR clear. UF to edw as tolerated. 4. Anemia  - Hgb variable here. On outpt Aranesp  60 dosed 12/13/13. Ferritin >1220. Recheck CBC 5. Metabolic bone disease -  Ca 8.6 (9.2 corrected). Phos poorly controlled outpt. PTH in range. Continue binders/Vit D/Sensipar 6. Nutrition - Renal carb mod/ multivit 7. DM - per primary 8. PAD/Left foot pain - Per primary. Followed outpt by podiatry. Plain films here with ? osteo left great toe.  9. Subtle lucencies of the calvarium on CT - myeloma in DDX and I found an SPEP from 2008 with faint restricted band.  Will repeat an SPEP.  Claud Kelp, PA-C [facility] Pager [phone] 12/15/2013, 8:54 AM  I have seen and examined this patient and agree with plan and documentation in note of KWarren, PA-C with highlighted additions.  80 yo AAF with ESRD, DM, HTN, old strokes by imaging who presented with gait/balance problems, "hot flashing feeling" of her right face, decreased sensation RUE, RLE, and  MRI evidence of acute non-hemorrhagic stroke on a background of old lacunar thalamic and cerebellar infarcts and extensive white matter disease.  To undergo antiplatelet therapy as workup continues, as well as OT,PT,speech. Receives regular HD on TTS and will continue per that schedule while in the hospital. Has some dry and ischemic looking changes to the left great toe with plain imaging equivocal for possible osteo in that toe.  Further workup of this per primary service.  I note that her CT remarked on subtle lucencies in the calvarium and included MM in the differential dx.  Of note on record review is the note of a restricted band with a monoclonal spike on SPEP from 2008. I have ordered a repeat SPEP.    Dannis Deroche B,MD 12/15/2013 10:16 AM

## 2013-12-15 NOTE — Progress Notes (Signed)
  Echocardiogram 2D Echocardiogram has been performed.  Arvil ChacoFoster, Schwanda Zima 12/15/2013, 1:51 PM

## 2013-12-15 NOTE — Progress Notes (Signed)
Dr Robb Matarrtiz said we could give her heparin today even if she goes to HD later. Telephone conversation.

## 2013-12-15 NOTE — Progress Notes (Signed)
OT Cancellation Note  Patient Details Name: Jamie LippsBetty G [name] MRN: [id number] DOB: 12/03/1932   Cancelled Treatment:    Reason Eval/Treat Not Completed: Patient at procedure or test/ unavailable. OT will follow up to complete evaluation as available.   Rae LipsMiller, Havish Petties M [phone] 12/15/2013, 3:50 PM

## 2013-12-15 NOTE — Progress Notes (Addendum)
[facility] PROGRESS NOTE Assessment/Plan: Acute left CVA (cerebral infarction) - not on antiplatelets prior to admission. Appreciate neurology assistance. - not a candidate for tPA. - HgbA1c pending, fasting lipid panel  HDL < 40, LDL < 70 TG's >240 - MRI, MRA of the brain without contrast showed acute stroke. - PT consult, OT consult, Speech consult pending. - Echocardiogram, lower ext dupplex  pending - Prophylactic therapy-Antiplatelet med: Aspirin - dose 325 mg PO daily  - Avoid D5 fluids as may be harmfull - risk factor modification  - Cardiac Monitoring no events.  Peripheral arterial disease -  Check x-ray of left foot. Outpatient follow up with [facility] and vascular. Aspirin  Normocytic Anemia: - Likely secondary to chronic kidney disease. Stable.  Diabetes mellitus: - last HbgA1c 5.6 one year ago.  ESRD on dialysis: - Scheduled dialysis in a.m  Secondary hyperparathyroidism:  - Continue home medications  Code Status: Full  Family Communication: Discussed with patient's nephew and niece at bedside.  Disposition Plan: Home when medically stable.     Consultants:  Neuro  Procedures: MRI /MRA 8.8.2015: Acute 4.5 mm nonhemorrhagic infarct within the lateral left thalamus were posterior limb internal capsule.  Extensive periventricular and subcortical white matter change bilaterally is likely related to microvascular ischemic change.  Remote lacunar infarcts of the thalami bilaterally. 4. Remote infarcts of the right cerebellum and brachium pontis.  The MRA demonstrates mild atherosclerotic changes within the cavernous carotid arteries and distal right vertebral artery without  focal stenosis.    Antibiotics:  none  HPI/Subjective: Feel better right side weakness and numbness improving  Objective: Filed Vitals:   12/15/13 0006 12/15/13 0200 12/15/13 0400 12/15/13 0600  BP: 104/41 101/43 111/42 102/40  Pulse:   92 68  Temp: 98.1 F  (36.7 C) 98.4 F (36.9 C) 98.2 F (36.8 C) 98 F (36.7 C)  TempSrc: Oral Oral Oral Oral  Resp: 16 14 14 14   Height:      Weight:      SpO2: 95% 94% 99% 97%   No intake or output data in the 24 hours ending 12/15/13 0739 Filed Weights   12/14/13 1230  Weight: 91.627 kg (202 lb)    Exam:  General: Alert, awake, oriented x3, in no acute distress.  HEENT: No bruits, no goiter.  Heart: Regular rate and rhythm. Lungs: Good air movement, clear Neuro: Grossly intact, nonfocal.   Data Reviewed: Basic Metabolic Panel:  Recent Labs Lab 12/14/13 1253 12/14/13 1323  NA 142 140  K 4.2 3.9  CL 100 101  CO2 26  --   GLUCOSE 98 100*  BUN 19 21  CREATININE 6.26* 7.20*  CALCIUM 8.6  --    Liver Function Tests:  Recent Labs Lab 12/14/13 1253  AST 15  ALT 7  ALKPHOS 82  BILITOT 0.4  PROT 7.3  ALBUMIN 3.2*   No results found for this basename: LIPASE, AMYLASE,  in the last 168 hours No results found for this basename: AMMONIA,  in the last 168 hours CBC:  Recent Labs Lab 12/14/13 1253 12/14/13 1323  WBC 11.2*  --   NEUTROABS 8.3*  --   HGB 10.0* 12.2  HCT 34.3* 36.0  MCV 82.5  --   PLT 237  --    Cardiac Enzymes: No results found for this basename: CKTOTAL, CKMB, CKMBINDEX, TROPONINI,  in the last 168 hours BNP (last 3 results) No results found for this basename: PROBNP,  in the last 8760 hours  CBG:  Recent Labs Lab 12/14/13 1902 12/14/13 2108 12/15/13 0646  GLUCAP 78 120* 131*    Recent Results (from the past 240 hour(s))  MRSA PCR SCREENING     Status: None   Collection Time    12/14/13  6:56 PM      Result Value Ref Range Status   MRSA by PCR NEGATIVE  NEGATIVE Final   Comment:            The GeneXpert MRSA Assay (FDA     approved for NASAL specimens     only), is one component of a     comprehensive MRSA colonization     surveillance program. It is not     intended to diagnose MRSA     infection nor to guide or     monitor treatment for       MRSA infections.     Studies: Dg Chest 2 View  12/14/2013   CLINICAL DATA:  Stroke  EXAM: CHEST  2 VIEW  COMPARISON:  11/02/2012  FINDINGS: Limited inspiratory effect. Stable elevated right diaphragm. Mild cardiac enlargement, which is stable. Vascular pattern is normal. Mild discoid atelectasis in the lingula. No pleural effusions.  IMPRESSION: No significant acute findings.   Electronically Signed   By: Esperanza Heir M.D.   On: 12/14/2013 20:50   Ct Head Wo Contrast  12/14/2013   CLINICAL DATA:  Stroke.  EXAM: CT HEAD WITHOUT CONTRAST  TECHNIQUE: Contiguous axial images were obtained from the base of the skull through the vertex without intravenous contrast.  COMPARISON:  None.  FINDINGS: No mass. No hydrocephalus. No hemorrhage. Diffuse cerebral and cerebellar atrophy. White matter changes noted consistent with chronic ischemia. No acute bony abnormality. Subtle lucencies noted throughout the calvarium. Metastatic disease or myeloma cannot be completely excluded. Chronic anemia cannot be excluded.  IMPRESSION: 1. Diffuse cerebral cerebellar atrophy. White matter changes consistent with chronic ischemia. No acute intracranial abnormality. 2. Subtle lucencies noted throughout the skull. Subtle changes of metastatic disease, myeloma, or chronic anemia cannot be excluded. Clinical correlation suggested.   Electronically Signed   By: Maisie Fus  Register   On: 12/14/2013 13:59   Mr Maxine Glenn Head Wo Contrast  12/14/2013   CLINICAL DATA:  New right-sided weakness.  EXAM: MRI HEAD WITHOUT CONTRAST  MRA HEAD WITHOUT CONTRAST  TECHNIQUE: Multiplanar, multiecho pulse sequences of the brain and surrounding structures were obtained without intravenous contrast. Angiographic images of the head were obtained using MRA technique without contrast.  COMPARISON:  CT head without contrast from the same day.  FINDINGS: MRI HEAD FINDINGS  A 4.5 mm focus of restricted diffusion is present in the lateral left thalamus or posterior  limb internal capsule. Associated T2 changes are noted. Additional remote lacunar infarcts are present in the thalami bilaterally. Extensive periventricular and subcortical white matter change is present bilaterally. White matter changes extend into the brainstem. Extensive remote infarcts are present within the right cerebellum and brachium pontis.  Flow is present in the major intracranial arteries. The globes and orbits are intact. The paranasal sinuses and mastoid air cells are clear.  MRA HEAD FINDINGS  Tortuosity is present in the cervical left internal carotid artery. There is mild atherosclerotic changes within the cavernous segments bilaterally. No focal stenosis is present. The A1 and M1 segments are normal. The anterior communicating artery is patent. The MCA bifurcations are intact. There is segmental attenuation of MCA branch vessels bilaterally.  The left vertebral artery is the dominant vessel. Atherosclerotic irregularity  is present in the distal right vertebral artery without significant stenosis. The PICA origins are visualized bilaterally. The basilar artery is normal. Both posterior cerebral arteries originate from the basilar tip. There is moderate attenuation of distal PCA branch vessels bilaterally.  IMPRESSION: 1. Acute 4.5 mm nonhemorrhagic infarct within the lateral left thalamus were posterior limb internal capsule. 2. Extensive periventricular and subcortical white matter change bilaterally is likely related to microvascular ischemic change. 3. Remote lacunar infarcts of the thalami bilaterally. 4. Remote infarcts of the right cerebellum and brachium pontis. 5. The MRA demonstrates mild atherosclerotic changes within the cavernous carotid arteries and distal right vertebral artery without focal stenosis. 6. Moderate medium and small vessel disease in both the anterior and posterior circulation.   Electronically Signed   By: Gennette Pachris  Mattern M.D.   On: 12/14/2013 16:35   Mr Brain Wo  Contrast  12/14/2013   CLINICAL DATA:  New right-sided weakness.  EXAM: MRI HEAD WITHOUT CONTRAST  MRA HEAD WITHOUT CONTRAST  TECHNIQUE: Multiplanar, multiecho pulse sequences of the brain and surrounding structures were obtained without intravenous contrast. Angiographic images of the head were obtained using MRA technique without contrast.  COMPARISON:  CT head without contrast from the same day.  FINDINGS: MRI HEAD FINDINGS  A 4.5 mm focus of restricted diffusion is present in the lateral left thalamus or posterior limb internal capsule. Associated T2 changes are noted. Additional remote lacunar infarcts are present in the thalami bilaterally. Extensive periventricular and subcortical white matter change is present bilaterally. White matter changes extend into the brainstem. Extensive remote infarcts are present within the right cerebellum and brachium pontis.  Flow is present in the major intracranial arteries. The globes and orbits are intact. The paranasal sinuses and mastoid air cells are clear.  MRA HEAD FINDINGS  Tortuosity is present in the cervical left internal carotid artery. There is mild atherosclerotic changes within the cavernous segments bilaterally. No focal stenosis is present. The A1 and M1 segments are normal. The anterior communicating artery is patent. The MCA bifurcations are intact. There is segmental attenuation of MCA branch vessels bilaterally.  The left vertebral artery is the dominant vessel. Atherosclerotic irregularity is present in the distal right vertebral artery without significant stenosis. The PICA origins are visualized bilaterally. The basilar artery is normal. Both posterior cerebral arteries originate from the basilar tip. There is moderate attenuation of distal PCA branch vessels bilaterally.  IMPRESSION: 1. Acute 4.5 mm nonhemorrhagic infarct within the lateral left thalamus were posterior limb internal capsule. 2. Extensive periventricular and subcortical white matter  change bilaterally is likely related to microvascular ischemic change. 3. Remote lacunar infarcts of the thalami bilaterally. 4. Remote infarcts of the right cerebellum and brachium pontis. 5. The MRA demonstrates mild atherosclerotic changes within the cavernous carotid arteries and distal right vertebral artery without focal stenosis. 6. Moderate medium and small vessel disease in both the anterior and posterior circulation.   Electronically Signed   By: Gennette Pachris  Mattern M.D.   On: 12/14/2013 16:35   Dg Foot Complete Left  12/14/2013   CLINICAL DATA:  Left foot pain, diabetes, chronic osteomyelitis versus dry gangrene left great toe  EXAM: LEFT FOOT - COMPLETE 3+ VIEW  COMPARISON:  None.  FINDINGS: Diffuse osteopenia. Extensive small vessel calcification. There is air in the soft tissues at the tip of the great toe. There are areas with a cortex of the tip of the distal phalanx is indistinct. There is a possible fracture involving the base of the  third proximal phalanx, but the margins are sclerotic suggesting that it is not acute.  IMPRESSION: Equivocal findings suggesting the possibility of osteomyelitis of the first distal phalanx in the setting of air in the surrounding soft tissues. Consider further evaluation with MRI.   Electronically Signed   By: Esperanza Heir M.D.   On: 12/14/2013 20:54    Scheduled Meds: . aspirin  300 mg Rectal Daily   Or  . aspirin  325 mg Oral Daily  . cinacalcet  30 mg Oral QPM  . gabapentin  100 mg Oral BID  . heparin  5,000 Units Subcutaneous 3 times per day  . lanthanum  2,000 mg Oral TID WC  . metroNIDAZOLE  250 mg Oral TID  . pantoprazole  40 mg Oral Daily   Continuous Infusions:    Marinda Elk  [facility] Pager [phone]. If 8PM-8AM, please contact night-coverage at [email or web address], password [facility] 12/15/2013, 7:39 AM  LOS: 1 day      **Disclaimer: This note may have been dictated with voice recognition software. Similar sounding words can  inadvertently be transcribed and this note may contain transcription errors which may not have been corrected upon publication of note.**

## 2013-12-15 NOTE — Progress Notes (Signed)
Report given to dialysis.  Patient is transported off unit via her bed for dialysis for four hour run cycle.  Patient is in no distress at this time.

## 2013-12-15 NOTE — Progress Notes (Signed)
Stroke Team Progress Note  HISTORY [name] is a 80 y.o. female who has known chronic right sided weakness. Family stated the weakness has been present since 2008-2009 and stated she came [facility]. Due to electronic chart conversion notes could not be found for her stroke at that time. MRI could not be found from that time.   Patient currently lives alone on a first floor apartment and cares for herself. On the day of admission she woke up and noted she felt off balance with her gait. She denied leaning to one side or the other. She also endorsed right facial "hot sensation" along the V2-V3 region which then became decreased sensation. When seen she still felt weak on the right arm and leg with decreased sensation on the right arm and leg (this is new). She also continued to feel decreased sensation on the right lower face.  She does not take ASA or Plavix.  She did note that she awoke at 4 AM and felt fine at that time after going to the bathroom. When she awoke at 8 AM she was off balance.   Date last known well: 08/07  Time last known well: 4 am  tPA Given: No: out of window    SUBJECTIVE The pt's niece is at the bedside. The pt feels she has improved but not quite back to baseline. Pt feels still with slurred speech. Niece has not noted speech problems. Dialysis later today.   OBJECTIVE Most recent Vital Signs: Filed Vitals:   12/15/13 0006 12/15/13 0200 12/15/13 0400 12/15/13 0600  BP: 104/41 101/43 111/42 102/40  Pulse:   92 68  Temp: 98.1 F (36.7 C) 98.4 F (36.9 C) 98.2 F (36.8 C) 98 F (36.7 C)  TempSrc: Oral Oral Oral Oral  Resp: 16 14 14 14   Height:      Weight:      SpO2: 95% 94% 99% 97%   CBG (last 3)   Recent Labs  12/14/13 1902 12/14/13 2108 12/15/13 0646  GLUCAP 78 120* 131*    IV Fluid Intake:     MEDICATIONS  . aspirin  300 mg Rectal Daily   Or  . aspirin  325 mg Oral Daily  . cinacalcet  30 mg Oral QPM  . gabapentin  100 mg Oral  BID  . heparin  5,000 Units Subcutaneous 3 times per day  . lanthanum  2,000 mg Oral TID WC  . metroNIDAZOLE  250 mg Oral TID  . pantoprazole  40 mg Oral Daily   PRN:  acetaminophen, acetaminophen, lanthanum  Diet:  Diabetic renal with thin liquids Activity:  Up with assistance DVT Prophylaxis:  [place] Heparin  CLINICALLY SIGNIFICANT STUDIES Basic Metabolic Panel:  Recent Labs Lab 12/14/13 1253 12/14/13 1323  NA 142 140  K 4.2 3.9  CL 100 101  CO2 26  --   GLUCOSE 98 100*  BUN 19 21  CREATININE 6.26* 7.20*  CALCIUM 8.6  --    Liver Function Tests:  Recent Labs Lab 12/14/13 1253  AST 15  ALT 7  ALKPHOS 82  BILITOT 0.4  PROT 7.3  ALBUMIN 3.2*   CBC:  Recent Labs Lab 12/14/13 1253 12/14/13 1323  WBC 11.2*  --   NEUTROABS 8.3*  --   HGB 10.0* 12.2  HCT 34.3* 36.0  MCV 82.5  --   PLT 237  --    Coagulation:  Recent Labs Lab 12/14/13 1253  LABPROT 12.7  INR 0.95  Cardiac Enzymes: No results found for this basename: CKTOTAL, CKMB, CKMBINDEX, TROPONINI,  in the last 168 hours Urinalysis: No results found for this basename: COLORURINE, APPERANCEUR, LABSPEC, PHURINE, GLUCOSEU, HGBUR, BILIRUBINUR, KETONESUR, PROTEINUR, UROBILINOGEN, NITRITE, LEUKOCYTESUR,  in the last 168 hours Lipid Panel    Component Value Date/Time   CHOL 127 12/15/2013 0421   TRIG 241* 12/15/2013 0421   HDL 35* 12/15/2013 0421   CHOLHDL 3.6 12/15/2013 0421   VLDL 48* 12/15/2013 0421   LDLCALC 44 12/15/2013 0421   HgbA1C  Lab Results  Component Value Date   HGBA1C 5.7* 10/31/2012    Urine Drug Screen:   No results found for this basename: labopia, cocainscrnur, labbenz, amphetmu, thcu, labbarb    Alcohol Level:  Recent Labs Lab 12/14/13 1253  ETH <11    Dg Chest 2 View 12/14/2013    No significant acute findings.    Ct Head Wo Contrast 12/14/2013    1. Diffuse cerebral cerebellar atrophy. White matter changes consistent with chronic ischemia. No acute intracranial abnormality. 2. Subtle  lucencies noted throughout the skull. Subtle changes of metastatic disease, myeloma, or chronic anemia cannot be excluded. Clinical correlation suggested.     MRI / Mra Head Wo Contrast 12/14/2013    1. Acute 4.5 mm nonhemorrhagic infarct within the lateral left thalamus were posterior limb internal capsule.  2. Extensive periventricular and subcortical white matter change bilaterally is likely related to microvascular ischemic change.  3. Remote lacunar infarcts of the thalami bilaterally.  4. Remote infarcts of the right cerebellum and brachium pontis.  5. The MRA demonstrates mild atherosclerotic changes within the cavernous carotid arteries and distal right vertebral artery without focal stenosis.  6. Moderate medium and small vessel disease in both the anterior and posterior circulation.      Dg Foot Complete Left 12/14/2013    Equivocal findings suggesting the possibility of osteomyelitis of the first distal phalanx in the setting of air in the surrounding soft tissues. Consider further evaluation with MRI.      Carotid Doppler - Pending  2D Echocardiogram - Pending  CXR    EKG - SR rate 75 BPM - For complete results please see formal report.   Therapy Recommendations - Pending  Physical Exam  Mental Status:  Alert, oriented, thought content appropriate. Speech fluent without evidence of aphasia. Able to follow 3 step commands without difficulty.  Cranial Nerves:  II: Discs not seen; Visual fields grossly normal, pupils equal, round, reactive to light and accommodation  III,IV, VI: ptosis not present, extra-ocular motions intact bilaterally  V,VII: smile symmetric, facial light touch sensation decreased on the right lower face  VIII: hearing normal bilaterally  IX,X: gag reflex present  XI: bilateral shoulder shrug  XII: midline tongue extension without atrophy or fasciculations  Motor:  Right : Upper extremity 4/5 Left:Orbits  Upper extremity 5/5 orbits left over right upper  extremity. Diminished fine finger movements on the right. Lower extremity 4/5 Lower extremity 5/5  Tone and bulk:normal tone throughout; no atrophy noted  Sensory: Pinprick and light touch decreased on the right arm and leg  Deep Tendon Reflexes:  Right: Upper Extremity Left: Upper extremity  biceps (C-5 to C-6) 2/4 biceps (C-5 to C-6) 2/4  tricep (C7) 2/4 triceps (C7) 2/4  Brachioradialis (C6) 2/4 Brachioradialis (C6) 2/4  Lower Extremity Lower Extremity  quadriceps (L-2 to L-4) 1/4 quadriceps (L-2 to L-4) 1/4  Achilles (S1) 0/4 Achilles (S1) 0/4  Plantars:  Mute bilaterally  Cerebellar:  normal  finger-to-nose, Ataxic right heel-to-shin test  Gait: not tested   ASSESSMENT [name] is a 80 y.o. female presenting with right hemiparesis and right hemisensory deficits. Out of window for TPA. MRI - acute 4.5 mm nonhemorrhagic infarct within the lateral left thalamus were posterior limb internal capsule. Infarct felt to be thrombotic secondary to small vessel disease.. On no antithrmbotics prior to admission. Now on aspirin 325 mg orally every day for secondary stroke prevention. Patient with resultant residual right sided numbness. Stroke work up underway.   Chol 127; LDL 44 - no statin PTA  Possible osteomyelitis left foot  CKD - dialysis today  Diabetes Mellitus - HbgA1C 5.7  Previous strokes  Gout hx   Hospital day # 1  TREATMENT/PLAN  Continue aspirin 325 mg orally every day for secondary stroke prevention.  Await therapists evals.  Await echo and carotid dopplers  Delton See PA-C [facility] Pager [phone] 12/15/2013, 10:46 AM  I have personally examined this patient, reviewed notes, independently viewed imaging studies, participated in medical decision making and plan of care. I have made any additions or clarifications directly to the above note. Agree with note above. She remains at risk for significant recurrent strokes/TIAs and  neurological worsening.I  discussed with her the need to stay on antiplatelet therapy and aggressive risk factor modification to prevent subsequent strokes. Delia Heady, MD Medical Director [facility] Pager: [phone] 12/15/2013 12:23 PM SIGNED    To contact Stroke Continuity provider, please refer to [email or web address]. After hours, contact General Neurology

## 2013-12-15 NOTE — Evaluation (Signed)
Speech Language Pathology Evaluation Patient Details Name: [name] MRN: [id number] DOB: 08/15/1932 Today's Date: 12/15/2013 Time: 0205-0230 SLP Time Calculation (min): 25 min  Problem List:  Patient Active Problem List   Diagnosis Date Noted  . CVA (cerebral infarction) 12/14/2013  . Dysphagia, unspecified(787.20) 11/28/2013  . Peripheral arterial disease 10/26/2013  . End stage renal disease 12/29/2012  . Sepsis due to Haemophilus influenzae 11/07/2012  . Hyperkalemia 10/31/2012  . Diabetes mellitus 10/31/2012  . Depression 10/31/2012  . Leukocytosis 10/31/2012  . Anemia 10/31/2012  . Acute respiratory distress 10/31/2012  . Pneumonia 10/31/2012  . C. difficile colitis 10/31/2012  . Chronic diarrhea 10/31/2012  . ESRD on dialysis 10/06/2012   Past Medical History:  Past Medical History  Diagnosis Date  . Diabetes mellitus   . Anemia   . Hyperparathyroidism   . Depression   . Obesity   . Osteoarthritis   . MRSA (methicillin resistant staph aureus) culture positive   . Gout   . C. difficile diarrhea   . Chronic kidney disease     on Dialysis  . Peripheral arterial disease   . Diabetic neuropathy   . GERD (gastroesophageal reflux disease)   . Stroke    Past Surgical History:  Past Surgical History  Procedure Laterality Date  . Breast biopsy Right   . Shuntogram Right 09/06/12    Thigh graft  . Sp declot avgg Right Sep 14, 2012    Right thigh graft   HPI:  80 y.o. female with history of DM 2 with renal complications and peripheral neuropathy, ESRD on TTS hemodialysis, prior stroke (patient/family unaware that she had a stroke) with residual right-sided weakness, secondary hyperparathyroidism, gout, PAD, GERD, presented to the ED on 12/14/13 with complaints of unsteady gait and right-sided weakness.  Patient's niece noticed that she was having slurred speech in a.m. of 12/14/13, unsteady gait, right-sided weakness and confusion. Patient states that she has mild  right-sided weakness since 2008-2009 but noticed worsened weakness and hot sensation and numbness across the right angle of the mouth and right side of jaw. She apparently went to her PCP and was sent to the ED for further evaluation. In the ED, MRI on 12/14/13 confirms acute 4.5 mm nonhemorrhagic infarct within the lateral left thalamus   Assessment / Plan / Recommendation Clinical Impression  Pt exhibiting mild expressive aphasia during sentence-conversational level attempts; pt stated her speech has improved since she was hospitalized yesterday on 12/14/13 and has returned to "how it was before" and denies any dysphagia; no reports from nursing re: dysphagia and pt passed swallow screen on 12/14/13; auditory comprehension and cognition appear wfl for functional tasks assessed; pt Ox4, and able to express wants/needs without difficulty, as well as detailed information re: personal information; no concerns with safety/judgment as well.  ST not recommended at this time d/t pt functioning at baseline for speech/verbal expression.      SLP Assessment  Patient does not need any further Speech Lanaguage Pathology Services    Follow Up Recommendations  None    Frequency and Duration   n/a     Pertinent Vitals/Pain Pain Assessment: No/denies pain   SLP Goals  SLP Goals Progress/Goals/Alternative treatment plan discussed with pt/caregiver and they: Agree  SLP Evaluation Prior Functioning  Cognitive/Linguistic Baseline: Within functional limits Type of Home: House  Lives With: Alone Available Help at Discharge: Family;Available 24 hours/day Education: High school education Vocation: Retired   IT consultant  Overall Cognitive Status: Within Functional Limits for tasks  assessed Arousal/Alertness: Awake/alert Orientation Level: Oriented X4 Memory: Appears intact Awareness: Appears intact Problem Solving: Appears intact Safety/Judgment: Appears intact    Comprehension  Auditory  Comprehension Overall Auditory Comprehension: Appears within functional limits for tasks assessed EffectiveTechniques: Pausing;Slowed speech Visual Recognition/Discrimination Discrimination: Not tested Reading Comprehension Reading Status: Within funtional limits    Expression Expression Primary Mode of Expression: Verbal Verbal Expression Overall Verbal Expression: Impaired Initiation: No impairment Level of Generative/Spontaneous Verbalization: Conversation Repetition: No impairment Naming: Impairment Responsive: 76-100% accurate Confrontation: Within functional limits Convergent: 75-100% accurate Divergent: 75-100% accurate Verbal Errors: Semantic paraphasias;Aware of errors;Phonemic paraphasias;Perseveration;Other (comment) (dysfluency) Pragmatics: No impairment Interfering Components: Premorbid deficit Non-Verbal Means of Communication: Not applicable Written Expression Dominant Hand: Right Written Expression: Not tested   Oral / Motor Oral Motor/Sensory Function Overall Oral Motor/Sensory Function: Appears within functional limits for tasks assessed Motor Speech Overall Motor Speech: Appears within functional limits for tasks assessed Respiration: Within functional limits Phonation: Hoarse (Pt stated she had an EGD recently to "stretch her esophagus") Resonance: Within functional limits Articulation: Impaired Level of Impairment: Conversation Intelligibility: Intelligibility reduced Word: Not tested Phrase: Not tested Sentence: Not tested Conversation: 75-100% accurate Motor Planning: Witnin functional limits Motor Speech Errors: Not applicable Interfering Components: Premorbid status Effective Techniques: Slow rate;Pause;Pacing        Rayshell Goecke,PAT, M.S., CCC-SLP 12/15/2013, 3:02 PM

## 2013-12-15 NOTE — Evaluation (Signed)
Physical Therapy Evaluation Patient Details Name: [name] MRN: [id number] DOB: [birth year] Today's Date: 12/15/2013   History of Present Illness  Patient is a 80 yo female admitted 12/14/13 with Rt-sided weakness, slurred speech, and feeling "off balance".  MRI - acute left non-hemorrhagic infarct.  PMH: ESRD on HD TTS, DM, peripheral neuropathy, CVA, PAD, depression  Clinical Impression  Patient presents with problems listed below.  Will benefit from acute PT to maximize functional mobility prior to discharge home.  Patient reports family can provide 24 hour assist.  Therefore, recommend HHPT.  If this level of assist is not available, may need to consider ST-SNF for continued therapy and 24 hour supervision.    Follow Up Recommendations Home health PT;Supervision/Assistance - 24 hour    Equipment Recommendations  None recommended by PT    Recommendations for Other Services       Precautions / Restrictions Precautions Precautions: Fall Restrictions Weight Bearing Restrictions: No      Mobility  Bed Mobility Overal bed mobility: Needs Assistance Bed Mobility: Sit to Supine       Sit to supine: Min guard   General bed mobility comments: Assist for safety  Transfers Overall transfer level: Needs assistance Equipment used: Rolling walker (2 wheeled) Transfers: Sit to/from Stand Sit to Stand: Min assist         General transfer comment: Verbal cues for hand placement.  Assist for balance and safety during transfers.  Decreased balance in standing.  Ambulation/Gait Ambulation/Gait assistance: Min assist Ambulation Distance (Feet): 82 Feet Assistive device: Rolling walker (2 wheeled) Gait Pattern/deviations: Step-through pattern;Decreased stride length;Ataxic Gait velocity: Decreased Gait velocity interpretation: Below normal speed for age/gender General Gait Details: Verbal cues for safe use of RW.  Patient with ataxic gait pattern, with decreased control of Rt LE  in stance and swing phases of gait.  Noted Rt knee bucklins x3 during gait.  Use of RW improved patient's balance and safety.  Stairs            Wheelchair Mobility    Modified Rankin (Stroke Patients Only) Modified Rankin (Stroke Patients Only) Pre-Morbid Rankin Score: Slight disability Modified Rankin: Moderately severe disability     Balance Overall balance assessment: Needs assistance         Standing balance support: Bilateral upper extremity supported;During functional activity Standing balance-Leahy Scale: Poor                               Pertinent Vitals/Pain Pain Assessment: No/denies pain    Home Living Family/patient expects to be discharged to:: Private residence Living Arrangements: Alone Available Help at Discharge: Family;Available 24 hours/day Type of Home: House Home Access: Stairs to enter Entrance Stairs-Rails: Doctor, general practice of Steps: 4 Home Layout: Two level;Laundry or work area in [employer] Equipment: Environmental consultant - 2 wheels;Wheelchair - manual;Cane - single point;Bedside commode      Prior Function Level of Independence: Independent;Needs assistance   Gait / Transfers Assistance Needed: Holds on to furniture at times  ADL's / Homemaking Assistance Needed: Sponge bath.  Family provides transportation for errands/appointments.        Hand Dominance   Dominant Hand: Right    Extremity/Trunk Assessment   Upper Extremity Assessment: Defer to OT evaluation           Lower Extremity Assessment: Generalized weakness         Communication   Communication: No difficulties  Cognition Arousal/Alertness: Awake/alert  Behavior During Therapy: WFL for tasks assessed/performed Overall Cognitive Status: Within Functional Limits for tasks assessed                      General Comments      Exercises        Assessment/Plan    PT Assessment Patient needs continued PT services  PT  Diagnosis Difficulty walking;Abnormality of gait;Generalized weakness   PT Problem List Decreased strength;Decreased activity tolerance;Decreased balance;Decreased mobility;Decreased knowledge of use of DME  PT Treatment Interventions DME instruction;Gait training;Stair training;Functional mobility training;Therapeutic activities;Balance training;Patient/family education   PT Goals (Current goals can be found in the Care Plan section) Acute Rehab PT Goals Patient Stated Goal: To go home soon PT Goal Formulation: With patient Time For Goal Achievement: 12/22/13 Potential to Achieve Goals: Good    Frequency Min 4X/week   Barriers to discharge Decreased caregiver support Patient lives alone.  Reports her family can provide 24 hour assist if needed.  Need to verify this with family.    Co-evaluation               End of Session Equipment Utilized During Treatment: Gait belt Activity Tolerance: Patient limited by fatigue Patient left: in bed;with call bell/phone within reach;with bed alarm set Nurse Communication: Mobility status         Time: [id number] PT Time Calculation (min): 19 min   Charges:   PT Evaluation $Initial PT Evaluation Tier I: 1 Procedure PT Treatments $Gait Training: 8-22 mins   PT G Codes:          Vena AustriaDavis, Jasey Cortez H 12/15/2013, 3:43 PM Durenda HurtSusan H. Renaldo Fiddleravis, PT, [facility] KatyMBA Acute Rehab Services Pager [phone]

## 2013-12-16 DIAGNOSIS — I6350 Cerebral infarction due to unspecified occlusion or stenosis of unspecified cerebral artery: Secondary | ICD-10-CM

## 2013-12-16 LAB — RENAL FUNCTION PANEL
ALBUMIN: 2.7 g/dL — AB (ref 3.5–5.2)
Anion gap: 14 (ref 5–15)
BUN: 15 mg/dL (ref 6–23)
CHLORIDE: 96 meq/L (ref 96–112)
CO2: 28 mEq/L (ref 19–32)
Calcium: 8.5 mg/dL (ref 8.4–10.5)
Creatinine, Ser: 5.23 mg/dL — ABNORMAL HIGH (ref 0.50–1.10)
GFR calc Af Amer: 8 mL/min — ABNORMAL LOW (ref >90)
GFR, EST NON AFRICAN AMERICAN: 7 mL/min — AB (ref >90)
Glucose, Bld: 141 mg/dL — ABNORMAL HIGH (ref 70–99)
PHOSPHORUS: 3.7 mg/dL (ref 2.3–4.6)
Potassium: 4.2 mEq/L (ref 3.7–5.3)
Sodium: 138 mEq/L (ref 137–147)

## 2013-12-16 LAB — CBC
HEMATOCRIT: 30.4 % — AB (ref 36.0–46.0)
Hemoglobin: 8.9 g/dL — ABNORMAL LOW (ref 12.0–15.0)
MCH: 23.6 pg — ABNORMAL LOW (ref 26.0–34.0)
MCHC: 29.3 g/dL — AB (ref 30.0–36.0)
MCV: 80.6 fL (ref 78.0–100.0)
PLATELETS: 196 10*3/uL (ref 150–400)
RBC: 3.77 MIL/uL — ABNORMAL LOW (ref 3.87–5.11)
RDW: 15.8 % — AB (ref 11.5–15.5)
WBC: 11.9 10*3/uL — ABNORMAL HIGH (ref 4.0–10.5)

## 2013-12-16 LAB — GLUCOSE, CAPILLARY
GLUCOSE-CAPILLARY: 166 mg/dL — AB (ref 70–99)
GLUCOSE-CAPILLARY: 153 mg/dL — AB (ref 70–99)
Glucose-Capillary: 142 mg/dL — ABNORMAL HIGH (ref 70–99)
Glucose-Capillary: 174 mg/dL — ABNORMAL HIGH (ref 70–99)

## 2013-12-16 MED ORDER — ASPIRIN 325 MG PO TABS: 325.0000 mg | ORAL_TABLET | Freq: Every day | ORAL | Status: DC

## 2013-12-16 MED ORDER — DOXERCALCIFEROL 4 MCG/2ML IV SOLN: 3.0000 ug | INTRAVENOUS | Status: DC

## 2013-12-16 NOTE — Progress Notes (Signed)
Stroke Team Progress Note  HISTORY [name] is a 80 y.o. female who has known chronic right sided weakness. Family stated the weakness has been present since 2008-2009 and [facility] Due to electronic chart conversion notes could not be found for her stroke at that time. MRI could not be found from that time.   Patient currently lives alone on a first floor apartment and cares for herself. On the day of admission she woke up and noted she felt off balance with her gait. She denied leaning to one side or the other. She also endorsed right facial "hot sensation" along the V2-V3 region which then became decreased sensation. When seen she still felt weak on the right arm and leg with decreased sensation on the right arm and leg (this is new). She also continued to feel decreased sensation on the right lower face.  She does not take ASA or Plavix.  She did note that she awoke at 4 AM and felt fine at that time after going to the bathroom. When she awoke at 8 AM she was off balance.   Date last known well: 08/07  Time last known well: 4 am  tPA Given: No: out of window    SUBJECTIVE No family present. Pt still with mild right sided numbness. Strength has improved.  OBJECTIVE Most recent Vital Signs: Filed Vitals:   12/15/13 1908 12/15/13 2156 12/16/13 0105 12/16/13 0531  BP: 130/82 138/63 122/88 133/51  Pulse: 101 102 65 86  Temp: 99.1 F (37.3 C) 98.9 F (37.2 C) 99.8 F (37.7 C) 98.8 F (37.1 C)  TempSrc: Oral Oral Oral Oral  Resp: 14 16 16 16   Height:      Weight: 198 lb 13.7 oz (90.2 kg)     SpO2: 100% 100% 100% 100%   CBG (last 3)   Recent Labs  12/15/13 1148 12/15/13 2100 12/16/13 0638  GLUCAP 105* 185* 142*    IV Fluid Intake:     MEDICATIONS  . aspirin  300 mg Rectal Daily   Or  . aspirin  325 mg Oral Daily  . cinacalcet  30 mg Oral QPM  . doxercalciferol  3 mcg Intravenous Q T,Th,Sa-HD  . gabapentin  100 mg Oral BID  . heparin   5,000 Units Subcutaneous 3 times per day  . lanthanum  2,000 mg Oral TID WC  . metroNIDAZOLE  250 mg Oral TID  . multivitamin  1 tablet Oral QHS  . pantoprazole  40 mg Oral Daily   PRN:  acetaminophen, acetaminophen, lanthanum  Diet:  Diabetic renal with thin liquids Activity:  Up with assistance DVT Prophylaxis:  Leary Heparin  CLINICALLY SIGNIFICANT STUDIES Basic Metabolic Panel:   Recent Labs Lab 12/14/13 1253 12/14/13 1323 12/16/13 0416  NA 142 140 138  K 4.2 3.9 4.2  CL 100 101 96  CO2 26  --  28  GLUCOSE 98 100* 141*  BUN 19 21 15   CREATININE 6.26* 7.20* 5.23*  CALCIUM 8.6  --  8.5  PHOS  --   --  3.7   Liver Function Tests:   Recent Labs Lab 12/14/13 1253 12/16/13 0416  AST 15  --   ALT 7  --   ALKPHOS 82  --   BILITOT 0.4  --   PROT 7.3  --   ALBUMIN 3.2* 2.7*   CBC:   Recent Labs Lab 12/14/13 1253 12/14/13 1323 12/16/13 0416  WBC 11.2*  --  11.9*  NEUTROABS 8.3*  --   --   HGB 10.0* 12.2 8.9*  HCT 34.3* 36.0 30.4*  MCV 82.5  --  80.6  PLT 237  --  196   Coagulation:   Recent Labs Lab 12/14/13 1253  LABPROT 12.7  INR 0.95   Cardiac Enzymes: No results found for this basename: CKTOTAL, CKMB, CKMBINDEX, TROPONINI,  in the last 168 hours Urinalysis: No results found for this basename: COLORURINE, APPERANCEUR, LABSPEC, PHURINE, GLUCOSEU, HGBUR, BILIRUBINUR, KETONESUR, PROTEINUR, UROBILINOGEN, NITRITE, LEUKOCYTESUR,  in the last 168 hours Lipid Panel    Component Value Date/Time   CHOL 127 12/15/2013 0421   TRIG 241* 12/15/2013 0421   HDL 35* 12/15/2013 0421   CHOLHDL 3.6 12/15/2013 0421   VLDL 48* 12/15/2013 0421   LDLCALC 44 12/15/2013 0421   HgbA1C  Lab Results  Component Value Date   HGBA1C 5.8* 12/15/2013    Urine Drug Screen:   No results found for this basename: labopia,  cocainscrnur,  labbenz,  amphetmu,  thcu,  labbarb    Alcohol Level:   Recent Labs Lab 12/14/13 1253  ETH <11    Dg Chest 2 View 12/14/2013    No significant  acute findings.    Ct Head Wo Contrast 12/14/2013    1. Diffuse cerebral cerebellar atrophy. White matter changes consistent with chronic ischemia. No acute intracranial abnormality. 2. Subtle lucencies noted throughout the skull. Subtle changes of metastatic disease, myeloma, or chronic anemia cannot be excluded. Clinical correlation suggested.     MRI / Mra Head Wo Contrast 12/14/2013    1. Acute 4.5 mm nonhemorrhagic infarct within the lateral left thalamus were posterior limb internal capsule.  2. Extensive periventricular and subcortical white matter change bilaterally is likely related to microvascular ischemic change.  3. Remote lacunar infarcts of the thalami bilaterally.  4. Remote infarcts of the right cerebellum and brachium pontis.  5. The MRA demonstrates mild atherosclerotic changes within the cavernous carotid arteries and distal right vertebral artery without focal stenosis.  6. Moderate medium and small vessel disease in both the anterior and posterior circulation.      Dg Foot Complete Left 12/14/2013    Equivocal findings suggesting the possibility of osteomyelitis of the first distal phalanx in the setting of air in the surrounding soft tissues. Consider further evaluation with MRI.      Carotid Doppler - Pending  2D Echocardiogram - ejection fraction 55-60%. Apparent small PFO by color Doppler.  CXR    EKG - SR rate 75 BPM - For complete results please see formal report.   Therapy Recommendations - HH PT recommended. OT eval pending.  Physical Exam  Mental Status:  Alert, oriented, thought content appropriate. Speech fluent without evidence of aphasia. Able to follow 3 step commands without difficulty.  Cranial Nerves:  II: Discs not seen; Visual fields grossly normal, pupils equal, round, reactive to light and accommodation  III,IV, VI: ptosis not present, extra-ocular motions intact bilaterally  V,VII: smile symmetric, facial light touch sensation decreased on  the right lower face  VIII: hearing normal bilaterally  IX,X: gag reflex present  XI: bilateral shoulder shrug  XII: midline tongue extension without atrophy or fasciculations  Motor:  Right : Upper extremity 4+/5 Left:Orbits  Upper extremity 5/5 orbits left over right upper extremity. Diminished fine finger movements on the right. Lower extremity 4+/5 Lower extremity 5/5  Tone and bulk:normal tone throughout; no atrophy noted  Sensory: Pinprick and light touch decreased on the right arm and  leg  Deep Tendon Reflexes:  Right: Upper Extremity Left: Upper extremity  biceps (C-5 to C-6) 2/4 biceps (C-5 to C-6) 2/4  tricep (C7) 2/4 triceps (C7) 2/4  Brachioradialis (C6) 2/4 Brachioradialis (C6) 2/4  Lower Extremity Lower Extremity  quadriceps (L-2 to L-4) 1/4 quadriceps (L-2 to L-4) 1/4  Achilles (S1) 0/4 Achilles (S1) 0/4  Plantars:  Mute bilaterally  Cerebellar:  normal finger-to-nose, Ataxic right heel-to-shin test  Gait: not tested   ASSESSMENT Ms. PATT STEINHARDT is a 80 y.o. female presenting with right hemiparesis and right hemisensory deficits. Out of window for TPA. MRI - acute 4.5 mm nonhemorrhagic infarct within the lateral left thalamus were posterior limb internal capsule. Infarct felt to be thrombotic secondary to small vessel disease.. On no antithrmbotics prior to admission. Now on aspirin 325 mg orally every day for secondary stroke prevention. Patient with resultant residual right sided numbness. Stroke work up underway.   Chol 127; LDL 44 - no statin PTA  Possible osteomyelitis left foot  ESRD - dialysis   Diabetes Mellitus - HbgA1C 5.7  Previous strokes  Gout hx  Apparent small PFO by 2D echo color Doppler.  Left foot xray - equivocal findings suggesting the possibility of osteomyelitis of the first distal phalanx   Hospital day # 2  TREATMENT/PLAN  Continue aspirin 325 mg orally every day for secondary stroke prevention.  Await therapists evals.-  Physical therapist recommends home health physical therapy. No followup speech. OT pending.  Await carotid dopplers  Check lower extremity duplex secondary to small PFO  Stroke team will sign off .Kindly call for questions   Delton See PA-C [facility] Pager [phone] 12/16/2013, 8:07 AM  I have personally examined this patient, reviewed notes, independently viewed imaging studies, participated in medical decision making and plan of care. I have made any additions or clarifications directly to the above note. Agree with note above.   Delia Heady, MD Medical Director [facility] Pager: [phone] 12/16/2013 8:07 AM      To contact Stroke Continuity provider, please refer to [email or web address]. After hours, contact General Neurology

## 2013-12-16 NOTE — Discharge Summary (Addendum)
Physician Discharge Summary  [name] ZOX:[id number] DOB: 04/09/1933 DOA: 12/14/2013  PCP: Emeterio Reeve, MD  Admit date: 12/14/2013 Discharge date: 12/17/2013  Time spent: 35 minutes  Recommendations for Outpatient Follow-up:  1. Follow up with PCP in 2-4 weeks. 2. Follow up with Neurology in 4 weeks.  Discharge Diagnoses:  Principal Problem:   CVA (cerebral infarction) Active Problems:   ESRD on dialysis   Diabetes mellitus   Anemia   Peripheral arterial disease   Discharge Condition: stable  Diet recommendation: ADA diet  Filed Weights   12/14/13 1230 12/15/13 1908  Weight: 91.627 kg (202 lb) 90.2 kg (198 lb 13.7 oz)    History of present illness:  80 y.o. female with history of DM 2 with renal complications and peripheral neuropathy, ESRD on TTS hemodialysis, prior stroke (patient/family unaware that she had a stroke) with residual right-sided weakness, secondary hyperparathyroidism, gout, PAD, GERD, presented to the ED with complaints of unsteady gait and right-sided weakness. Patient went to bed on 12/13/13 without complaints. At 8:30 AM on 12/14/13, patient's niece noticed that she was having slurred speech, unsteady gait, right-sided weakness and confusion. Patient states that she has mild right-sided weakness since 2008-2009 but noticed worsened weakness and hot sensation and numbness across the right angle of the mouth and right side of jaw. She apparently went to her PCP and was sent to the ED for further evaluation.    Hospital Course:  Acute left CVA (cerebral infarction)  - not on antiplatelets prior to admission. Appreciate neurology assistance.  - not a candidate for tPA.  - HgbA1c 5.6, fasting lipid panel HDL < 40, LDL < 70 TG's >240  - MRI, MRA of the brain without contrast showed acute stroke.  - PT consult, OT consult, Speech consult no needs - Echocardiogram ejection fraction was in the range of 55% to 60%. Wall motion was normal; there were no regional  wall motion abnormalities. Doppler parameters are consistent with abnormal left ventricular relaxation (grade 1 diastolic dysfunction). The E/e&' ratio is between 8-15, suggesting indeterminate LV filling pressure, lower ext dupplex  No DVT - Prophylactic therapy-Antiplatelet med: Aspirin - dose 81 mg PO daily   Peripheral arterial disease/Dry gangrene: - Check x-ray of left foot. Outpatient follow up with [facility] and vascular. Cont Aspirin  - X-ray and MRI osteomyelitis. Will need to with [facility] as an outpatient and cardiology to see if re-vascularization is needed. - Started empirically on cipro and doxy. Cannot perform any surgical procedure at this time due to her recent CVA. - Will have to be evaluated by Neurology and cleared for surgical procedure by vascular surgery.  Normocytic Anemia:  - Likely secondary to chronic kidney disease. Stable.   Diabetes mellitus:  - HbgA1c 5.6. - Excellent treatment.   Procedures: Echo 8.8.2015: ejection fraction was in the range of 55% to 60%. Wall motion was normal; there were no regional wall motion abnormalities. Doppler parameters are consistent with abnormal left ventricular relaxation (grade 1 diastolic dysfunction). The E/e&' ratio is between 8-15, suggesting indeterminate LV filling pressure.  Consultations:  neurology  Discharge Exam: Filed Vitals:   12/17/13 1340  BP: 118/47  Pulse: 83  Temp: 98 F (36.7 C)  Resp: 18    General: A&O x3 Cardiovascular: RRR Respiratory: good air movement CTA B/L  Discharge Instructions You were cared for by a hospitalist during your hospital stay. If you have any questions about your discharge medications or the care you received while  you were in the hospital after you are discharged, you can call the unit and asked to speak with the hospitalist on call if the hospitalist that took care of you is not available. Once you are discharged, your primary care physician will  handle any further medical issues. Please note that NO REFILLS for any discharge medications will be authorized once you are discharged, as it is imperative that you return to your primary care physician (or establish a relationship with a primary care physician if you do not have one) for your aftercare needs so that they can reassess your need for medications and monitor your lab values.      Discharge Instructions   Diet - low sodium heart healthy    Complete by:  As directed      Diet - low sodium heart healthy    Complete by:  As directed      Increase activity slowly    Complete by:  As directed      Increase activity slowly    Complete by:  As directed             Medication List    STOP taking these medications       acetaminophen 500 MG tablet  Commonly known as:  TYLENOL     metroNIDAZOLE 250 MG tablet  Commonly known as:  FLAGYL      TAKE these medications       aspirin 325 MG tablet  Take 1 tablet (325 mg total) by mouth daily.     ciprofloxacin 250 MG tablet  Commonly known as:  CIPRO  Take 1 tablet (250 mg total) by mouth 2 (two) times daily.     doxercalciferol 4 MCG/2ML injection  Commonly known as:  HECTOROL  Inject 1.5 mLs (3 mcg total) into the vein Every Tuesday,Thursday,and Saturday with dialysis.     doxycycline 100 MG capsule  Commonly known as:  VIBRAMYCIN  Take 1 capsule (100 mg total) by mouth 2 (two) times daily.     gabapentin 100 MG capsule  Commonly known as:  NEURONTIN  Take 100 mg by mouth 2 (two) times daily.     lanthanum 1000 MG chewable tablet  Commonly known as:  FOSRENOL  Chew 1,000-2,000 mg by mouth 3 (three) times daily with meals. Take 2000 mg by mouth 3 times daily with meals and take 1000mg  by mouth with snacks.     omeprazole 20 MG capsule  Commonly known as:  PRILOSEC  Take 1 capsule (20 mg total) by mouth daily.     SENSIPAR 30 MG tablet  Generic drug:  cinacalcet  Take 30 mg by mouth every evening.        Allergies  Allergen Reactions  . Codeine Hives  . Vancomycin Rash   Follow-up Information   Follow up with Emeterio Reeve, MD In 3 weeks. (hospital follow up)    Specialty:  Family Medicine   Contact information:   [address] [place] [address] [phone]       Follow up with Xu,Jindong, MD In 4 weeks. (Stroke Follow Up)    Specialty:  Neurology   Contact information:   [address] [place] [address] [phone]       Follow up with [facility] In 2 weeks. (hospital follow up)       Follow up with Runell Gess, MD In 1 week. (hospital follow up)    Specialty:  Cardiology  Contact information:   [address] [place] [id number] [phone]        The results of significant diagnostics from this hospitalization (including imaging, microbiology, ancillary and laboratory) are listed below for reference.    Significant Diagnostic Studies: Dg Chest 2 View  12/14/2013   CLINICAL DATA:  Stroke  EXAM: CHEST  2 VIEW  COMPARISON:  11/02/2012  FINDINGS: Limited inspiratory effect. Stable elevated right diaphragm. Mild cardiac enlargement, which is stable. Vascular pattern is normal. Mild discoid atelectasis in the lingula. No pleural effusions.  IMPRESSION: No significant acute findings.   Electronically Signed   By: Esperanza Heiraymond  Rubner M.D.   On: 12/14/2013 20:50   Ct Head Wo Contrast  12/14/2013   CLINICAL DATA:  Stroke.  EXAM: CT HEAD WITHOUT CONTRAST  TECHNIQUE: Contiguous axial images were obtained from the base of the skull through the vertex without intravenous contrast.  COMPARISON:  None.  FINDINGS: No mass. No hydrocephalus. No hemorrhage. Diffuse cerebral and cerebellar atrophy. White matter changes noted consistent with chronic ischemia. No acute bony abnormality. Subtle lucencies noted throughout the calvarium. Metastatic disease or myeloma cannot be completely excluded. Chronic anemia cannot be  excluded.  IMPRESSION: 1. Diffuse cerebral cerebellar atrophy. White matter changes consistent with chronic ischemia. No acute intracranial abnormality. 2. Subtle lucencies noted throughout the skull. Subtle changes of metastatic disease, myeloma, or chronic anemia cannot be excluded. Clinical correlation suggested.   Electronically Signed   By: Maisie Fushomas  Register   On: 12/14/2013 13:59   Mr Maxine GlennMra Head Wo Contrast  12/14/2013   CLINICAL DATA:  New right-sided weakness.  EXAM: MRI HEAD WITHOUT CONTRAST  MRA HEAD WITHOUT CONTRAST  TECHNIQUE: Multiplanar, multiecho pulse sequences of the brain and surrounding structures were obtained without intravenous contrast. Angiographic images of the head were obtained using MRA technique without contrast.  COMPARISON:  CT head without contrast from the same day.  FINDINGS: MRI HEAD FINDINGS  A 4.5 mm focus of restricted diffusion is present in the lateral left thalamus or posterior limb internal capsule. Associated T2 changes are noted. Additional remote lacunar infarcts are present in the thalami bilaterally. Extensive periventricular and subcortical white matter change is present bilaterally. White matter changes extend into the brainstem. Extensive remote infarcts are present within the right cerebellum and brachium pontis.  Flow is present in the major intracranial arteries. The globes and orbits are intact. The paranasal sinuses and mastoid air cells are clear.  MRA HEAD FINDINGS  Tortuosity is present in the cervical left internal carotid artery. There is mild atherosclerotic changes within the cavernous segments bilaterally. No focal stenosis is present. The A1 and M1 segments are normal. The anterior communicating artery is patent. The MCA bifurcations are intact. There is segmental attenuation of MCA branch vessels bilaterally.  The left vertebral artery is the dominant vessel. Atherosclerotic irregularity is present in the distal right vertebral artery without  significant stenosis. The PICA origins are visualized bilaterally. The basilar artery is normal. Both posterior cerebral arteries originate from the basilar tip. There is moderate attenuation of distal PCA branch vessels bilaterally.  IMPRESSION: 1. Acute 4.5 mm nonhemorrhagic infarct within the lateral left thalamus were posterior limb internal capsule. 2. Extensive periventricular and subcortical white matter change bilaterally is likely related to microvascular ischemic change. 3. Remote lacunar infarcts of the thalami bilaterally. 4. Remote infarcts of the right cerebellum and brachium pontis. 5. The MRA demonstrates mild atherosclerotic changes within the cavernous carotid arteries and distal right vertebral artery  without focal stenosis. 6. Moderate medium and small vessel disease in both the anterior and posterior circulation.   Electronically Signed   By: Gennette Pac M.D.   On: 12/14/2013 16:35   Mr Brain Wo Contrast  12/14/2013   CLINICAL DATA:  New right-sided weakness.  EXAM: MRI HEAD WITHOUT CONTRAST  MRA HEAD WITHOUT CONTRAST  TECHNIQUE: Multiplanar, multiecho pulse sequences of the brain and surrounding structures were obtained without intravenous contrast. Angiographic images of the head were obtained using MRA technique without contrast.  COMPARISON:  CT head without contrast from the same day.  FINDINGS: MRI HEAD FINDINGS  A 4.5 mm focus of restricted diffusion is present in the lateral left thalamus or posterior limb internal capsule. Associated T2 changes are noted. Additional remote lacunar infarcts are present in the thalami bilaterally. Extensive periventricular and subcortical white matter change is present bilaterally. White matter changes extend into the brainstem. Extensive remote infarcts are present within the right cerebellum and brachium pontis.  Flow is present in the major intracranial arteries. The globes and orbits are intact. The paranasal sinuses and mastoid air cells are  clear.  MRA HEAD FINDINGS  Tortuosity is present in the cervical left internal carotid artery. There is mild atherosclerotic changes within the cavernous segments bilaterally. No focal stenosis is present. The A1 and M1 segments are normal. The anterior communicating artery is patent. The MCA bifurcations are intact. There is segmental attenuation of MCA branch vessels bilaterally.  The left vertebral artery is the dominant vessel. Atherosclerotic irregularity is present in the distal right vertebral artery without significant stenosis. The PICA origins are visualized bilaterally. The basilar artery is normal. Both posterior cerebral arteries originate from the basilar tip. There is moderate attenuation of distal PCA branch vessels bilaterally.  IMPRESSION: 1. Acute 4.5 mm nonhemorrhagic infarct within the lateral left thalamus were posterior limb internal capsule. 2. Extensive periventricular and subcortical white matter change bilaterally is likely related to microvascular ischemic change. 3. Remote lacunar infarcts of the thalami bilaterally. 4. Remote infarcts of the right cerebellum and brachium pontis. 5. The MRA demonstrates mild atherosclerotic changes within the cavernous carotid arteries and distal right vertebral artery without focal stenosis. 6. Moderate medium and small vessel disease in both the anterior and posterior circulation.   Electronically Signed   By: Gennette Pac M.D.   On: 12/14/2013 16:35   Dg Foot Complete Left  12/14/2013   CLINICAL DATA:  Left foot pain, diabetes, chronic osteomyelitis versus dry gangrene left great toe  EXAM: LEFT FOOT - COMPLETE 3+ VIEW  COMPARISON:  None.  FINDINGS: Diffuse osteopenia. Extensive small vessel calcification. There is air in the soft tissues at the tip of the great toe. There are areas with a cortex of the tip of the distal phalanx is indistinct. There is a possible fracture involving the base of the third proximal phalanx, but the margins are  sclerotic suggesting that it is not acute.  IMPRESSION: Equivocal findings suggesting the possibility of osteomyelitis of the first distal phalanx in the setting of air in the surrounding soft tissues. Consider further evaluation with MRI.   Electronically Signed   By: Esperanza Heir M.D.   On: 12/14/2013 20:54    Microbiology: Recent Results (from the past 240 hour(s))  MRSA PCR SCREENING     Status: None   Collection Time    12/14/13  6:56 PM      Result Value Ref Range Status   MRSA by PCR NEGATIVE  NEGATIVE Final  Comment:            The GeneXpert MRSA Assay (FDA     approved for NASAL specimens     only), is one component of a     comprehensive MRSA colonization     surveillance program. It is not     intended to diagnose MRSA     infection nor to guide or     monitor treatment for     MRSA infections.     Labs: Basic Metabolic Panel:  Recent Labs Lab 12/14/13 1253 12/14/13 1323 12/16/13 0416  NA 142 140 138  K 4.2 3.9 4.2  CL 100 101 96  CO2 26  --  28  GLUCOSE 98 100* 141*  BUN 19 21 15   CREATININE 6.26* 7.20* 5.23*  CALCIUM 8.6  --  8.5  PHOS  --   --  3.7   Liver Function Tests:  Recent Labs Lab 12/14/13 1253 12/16/13 0416  AST 15  --   ALT 7  --   ALKPHOS 82  --   BILITOT 0.4  --   PROT 7.3  --   ALBUMIN 3.2* 2.7*   No results found for this basename: LIPASE, AMYLASE,  in the last 168 hours No results found for this basename: AMMONIA,  in the last 168 hours CBC:  Recent Labs Lab 12/14/13 1253 12/14/13 1323 12/16/13 0416  WBC 11.2*  --  11.9*  NEUTROABS 8.3*  --   --   HGB 10.0* 12.2 8.9*  HCT 34.3* 36.0 30.4*  MCV 82.5  --  80.6  PLT 237  --  196   Cardiac Enzymes: No results found for this basename: CKTOTAL, CKMB, CKMBINDEX, TROPONINI,  in the last 168 hours BNP: BNP (last 3 results) No results found for this basename: PROBNP,  in the last 8760 hours CBG:  Recent Labs Lab 12/16/13 1627 12/16/13 2053 12/17/13 0657  12/17/13 1140 12/17/13 1637  GLUCAP 153* 174* 109* 218* 130*       Signed:  FELIZ ORTIZ, Kiril Hippe  [facility] 12/17/2013, 5:31 PM

## 2013-12-16 NOTE — Progress Notes (Signed)
Patient's son requires some detailed information regarding his mother's reason for admission and his ongoing prognosis for his job to avoid any type of disciplinary action.  Patient granted permission to release some information to her son for his job. Nurse provided ER history and presenting symptoms and some information regarding progression of care.

## 2013-12-16 NOTE — Progress Notes (Signed)
*  PRELIMINARY RESULTS* Vascular Ultrasound Carotid Duplex (Doppler) has been completed.  Bilateral:  1-39% ICA stenosis.  Vertebral artery flow is antegrade.    Bilateral lower extremity venous duplex has been completed. Bilateral:  No obvious evidence of DVT, superficial thrombosis, or Baker's Cyst.    Farrel DemarkJill Eunice, RDMS, RVT  12/16/2013, 10:33 AM

## 2013-12-16 NOTE — Progress Notes (Signed)
[facility] Progress Note  Subjective:   No emerging complaints. Rt side feels "about the same." Home this afternoon.  Objective Filed Vitals:   12/15/13 1908 12/15/13 2156 12/16/13 0105 12/16/13 0531  BP: 130/82 138/63 122/88 133/51  Pulse: 101 102 65 86  Temp: 99.1 F (37.3 C) 98.9 F (37.2 C) 99.8 F (37.7 C) 98.8 F (37.1 C)  TempSrc: Oral Oral Oral Oral  Resp: 14 16 16 16   Height:      Weight: 90.2 kg (198 lb 13.7 oz)     SpO2: 100% 100% 100% 100%   Physical Exam General: Alert, cooperative, NAD, slight rt facial droop. Speech clear. Heart: RRR Lungs: Grossly clear Abdomen: soft, NT, +BS Extremities: Trace LE edema Dialysis Access: Rt fem AVG + bruit  Dialysis Orders: Center:[facility] on TTS .  EDW 90kg HD Bath 2K/2.25Ca Time 4hg Heparin 5000 bolus/2000 mid. Access Rt fem AVG 450/800  Hectorol 3 mcg IV/HD Aranesp 60 q Thurs Venofer 0  Recent labs: Hgb 9.3. Tsat 25%. Ferr 1298. Phos 8.8, PTH 180  Assessment/Plan: 1. Acute left non-hemorrhagic CVA (on a background of probable old strokes to which pt had adapted over time) - Mgmt per neuro. Now on ASA 325mg . Carotid dopplers with 1-39% ICA stenosis/ vertebral artery flow antegrade. LE duplex neg. Echo shows small PFO by color doppler, EF 55-60%.  PT with recs for HH/PT. No follow up Speech. OT eval pending.   2. ESRD - TTS,  Next HD 8/11 outpatient. 3. Hypertension/volume - SBPs 120s-130s. Close to EDW. 4. Anemia - Hgb 8.9 and variable here. On outpt Aranesp 60 dosed 12/13/13. Ferritin >1220.  5. Metabolic bone disease - Ca 8.6 (9.2 corrected). Phos 3.7. PTH in range. Continue binders/Vit D/Sensipar 6. Nutrition - Renal carb mod/ multivit 7. DM - per primary 8. PAD/ Dry gangrene Lt great toe - Per primary. Outpt follow up with [facility]. Per primary. 9. Subtle lucencies of the calvarium on CT - myeloma in DDX.  SPEP from 2008 with faint restricted band. Repeat SPEP pending. 10. Dispo - Home  later today? Stable from renal standpoint. Follow up SPEP findings as outpatient. ?? Flagyl   Scot Jun. Broadus John, PA-C [facility] Pager [phone] 12/16/2013,10:50 AM  LOS: 2 days  I have seen and examined this patient and agree with plan and documentation of KWarren PA-C. Pt will go home on aspirin.  We will f/u on the SPEP result as outpt.  I called Dr. Robb Matar - we have determined that she does not need to be on Flagyl - she received it last year for CDiff - we think it showed up as a carryover from her admission then, on her H and P this time.  He will discontinue this.  She will go to her outpt center on Tuesday for dialysis. Raymie Giammarco B,MD 12/16/2013 12:00 PM   Additional Objective Labs: Basic Metabolic Panel:  Recent Labs Lab 12/14/13 1253 12/14/13 1323 12/16/13 0416  NA 142 140 138  K 4.2 3.9 4.2  CL 100 101 96  CO2 26  --  28  GLUCOSE 98 100* 141*  BUN 19 21 15   CREATININE 6.26* 7.20* 5.23*  CALCIUM 8.6  --  8.5  PHOS  --   --  3.7   Liver Function Tests:  Recent Labs Lab 12/14/13 1253 12/16/13 0416  AST 15  --   ALT 7  --   ALKPHOS 82  --   BILITOT 0.4  --  PROT 7.3  --   ALBUMIN 3.2* 2.7*   CBC:  Recent Labs Lab 12/14/13 1253 12/14/13 1323 12/16/13 0416  WBC 11.2*  --  11.9*  NEUTROABS 8.3*  --   --   HGB 10.0* 12.2 8.9*  HCT 34.3* 36.0 30.4*  MCV 82.5  --  80.6  PLT 237  --  196   Blood Culture    Component Value Date/Time   SDES BLOOD HEMODIALYSIS GRAFT THIGH 10/31/2012 1920   SPECREQUEST BOTTLES DRAWN AEROBIC AND ANAEROBIC 10CC 10/31/2012 1920   CULT NO GROWTH 5 DAYS 10/31/2012 1920   REPTSTATUS 11/07/2012 FINAL 10/31/2012 1920    CBG:  Recent Labs Lab 12/14/13 2108 12/15/13 0646 12/15/13 1148 12/15/13 2100 12/16/13 0638  GLUCAP 120* 131* 105* 185* 142*    Studies/Results: Dg Chest 2 View  12/14/2013   CLINICAL DATA:  Stroke  EXAM: CHEST  2 VIEW  COMPARISON:  11/02/2012  FINDINGS: Limited inspiratory effect. Stable  elevated right diaphragm. Mild cardiac enlargement, which is stable. Vascular pattern is normal. Mild discoid atelectasis in the lingula. No pleural effusions.  IMPRESSION: No significant acute findings.   Electronically Signed   By: Esperanza Heir M.D.   On: 12/14/2013 20:50   Ct Head Wo Contrast  12/14/2013   CLINICAL DATA:  Stroke.  EXAM: CT HEAD WITHOUT CONTRAST  TECHNIQUE: Contiguous axial images were obtained from the base of the skull through the vertex without intravenous contrast.  COMPARISON:  None.  FINDINGS: No mass. No hydrocephalus. No hemorrhage. Diffuse cerebral and cerebellar atrophy. White matter changes noted consistent with chronic ischemia. No acute bony abnormality. Subtle lucencies noted throughout the calvarium. Metastatic disease or myeloma cannot be completely excluded. Chronic anemia cannot be excluded.  IMPRESSION: 1. Diffuse cerebral cerebellar atrophy. White matter changes consistent with chronic ischemia. No acute intracranial abnormality. 2. Subtle lucencies noted throughout the skull. Subtle changes of metastatic disease, myeloma, or chronic anemia cannot be excluded. Clinical correlation suggested.   Electronically Signed   By: Maisie Fus  Register   On: 12/14/2013 13:59   Mr Maxine Glenn Head Wo Contrast  12/14/2013   CLINICAL DATA:  New right-sided weakness.  EXAM: MRI HEAD WITHOUT CONTRAST  MRA HEAD WITHOUT CONTRAST  TECHNIQUE: Multiplanar, multiecho pulse sequences of the brain and surrounding structures were obtained without intravenous contrast. Angiographic images of the head were obtained using MRA technique without contrast.  COMPARISON:  CT head without contrast from the same day.  FINDINGS: MRI HEAD FINDINGS  A 4.5 mm focus of restricted diffusion is present in the lateral left thalamus or posterior limb internal capsule. Associated T2 changes are noted. Additional remote lacunar infarcts are present in the thalami bilaterally. Extensive periventricular and subcortical white  matter change is present bilaterally. White matter changes extend into the brainstem. Extensive remote infarcts are present within the right cerebellum and brachium pontis.  Flow is present in the major intracranial arteries. The globes and orbits are intact. The paranasal sinuses and mastoid air cells are clear.  MRA HEAD FINDINGS  Tortuosity is present in the cervical left internal carotid artery. There is mild atherosclerotic changes within the cavernous segments bilaterally. No focal stenosis is present. The A1 and M1 segments are normal. The anterior communicating artery is patent. The MCA bifurcations are intact. There is segmental attenuation of MCA branch vessels bilaterally.  The left vertebral artery is the dominant vessel. Atherosclerotic irregularity is present in the distal right vertebral artery without significant stenosis. The PICA origins are visualized bilaterally. The  basilar artery is normal. Both posterior cerebral arteries originate from the basilar tip. There is moderate attenuation of distal PCA branch vessels bilaterally.  IMPRESSION: 1. Acute 4.5 mm nonhemorrhagic infarct within the lateral left thalamus were posterior limb internal capsule. 2. Extensive periventricular and subcortical white matter change bilaterally is likely related to microvascular ischemic change. 3. Remote lacunar infarcts of the thalami bilaterally. 4. Remote infarcts of the right cerebellum and brachium pontis. 5. The MRA demonstrates mild atherosclerotic changes within the cavernous carotid arteries and distal right vertebral artery without focal stenosis. 6. Moderate medium and small vessel disease in both the anterior and posterior circulation.   Electronically Signed   By: Gennette Pachris  Mattern M.D.   On: 12/14/2013 16:35   Mr Brain Wo Contrast  12/14/2013   CLINICAL DATA:  New right-sided weakness.  EXAM: MRI HEAD WITHOUT CONTRAST  MRA HEAD WITHOUT CONTRAST  TECHNIQUE: Multiplanar, multiecho pulse sequences of the  brain and surrounding structures were obtained without intravenous contrast. Angiographic images of the head were obtained using MRA technique without contrast.  COMPARISON:  CT head without contrast from the same day.  FINDINGS: MRI HEAD FINDINGS  A 4.5 mm focus of restricted diffusion is present in the lateral left thalamus or posterior limb internal capsule. Associated T2 changes are noted. Additional remote lacunar infarcts are present in the thalami bilaterally. Extensive periventricular and subcortical white matter change is present bilaterally. White matter changes extend into the brainstem. Extensive remote infarcts are present within the right cerebellum and brachium pontis.  Flow is present in the major intracranial arteries. The globes and orbits are intact. The paranasal sinuses and mastoid air cells are clear.  MRA HEAD FINDINGS  Tortuosity is present in the cervical left internal carotid artery. There is mild atherosclerotic changes within the cavernous segments bilaterally. No focal stenosis is present. The A1 and M1 segments are normal. The anterior communicating artery is patent. The MCA bifurcations are intact. There is segmental attenuation of MCA branch vessels bilaterally.  The left vertebral artery is the dominant vessel. Atherosclerotic irregularity is present in the distal right vertebral artery without significant stenosis. The PICA origins are visualized bilaterally. The basilar artery is normal. Both posterior cerebral arteries originate from the basilar tip. There is moderate attenuation of distal PCA branch vessels bilaterally.  IMPRESSION: 1. Acute 4.5 mm nonhemorrhagic infarct within the lateral left thalamus were posterior limb internal capsule. 2. Extensive periventricular and subcortical white matter change bilaterally is likely related to microvascular ischemic change. 3. Remote lacunar infarcts of the thalami bilaterally. 4. Remote infarcts of the right cerebellum and brachium  pontis. 5. The MRA demonstrates mild atherosclerotic changes within the cavernous carotid arteries and distal right vertebral artery without focal stenosis. 6. Moderate medium and small vessel disease in both the anterior and posterior circulation.   Electronically Signed   By: Gennette Pachris  Mattern M.D.   On: 12/14/2013 16:35   Dg Foot Complete Left  12/14/2013   CLINICAL DATA:  Left foot pain, diabetes, chronic osteomyelitis versus dry gangrene left great toe  EXAM: LEFT FOOT - COMPLETE 3+ VIEW  COMPARISON:  None.  FINDINGS: Diffuse osteopenia. Extensive small vessel calcification. There is air in the soft tissues at the tip of the great toe. There are areas with a cortex of the tip of the distal phalanx is indistinct. There is a possible fracture involving the base of the third proximal phalanx, but the margins are sclerotic suggesting that it is not acute.  IMPRESSION: Equivocal findings  suggesting the possibility of osteomyelitis of the first distal phalanx in the setting of air in the surrounding soft tissues. Consider further evaluation with MRI.   Electronically Signed   By: Esperanza Heir M.D.   On: 12/14/2013 20:54   Medications:   . aspirin  300 mg Rectal Daily   Or  . aspirin  325 mg Oral Daily  . cinacalcet  30 mg Oral QPM  . doxercalciferol  3 mcg Intravenous Q T,Th,Sa-HD  . gabapentin  100 mg Oral BID  . heparin  5,000 Units Subcutaneous 3 times per day  . lanthanum  2,000 mg Oral TID WC  . metroNIDAZOLE  250 mg Oral TID  . multivitamin  1 tablet Oral QHS  . pantoprazole  40 mg Oral Daily

## 2013-12-16 NOTE — Progress Notes (Signed)
Occupational Therapy Evaluation Patient Details Name: Jamie LippsBetty G Haen MRN: [id number] DOB: 10/21/1932 Today's Date: 12/16/2013    History of Present Illness Patient is a 80 yo female admitted 12/14/13 with Rt-sided weakness, slurred speech, and feeling "off balance".  MRI - acute left non-hemorrhagic infarct.  PMH: ESRD on HD TTS, DM, peripheral neuropathy, CVA, PAD, depression   Clinical Impression   PTA pt lived at home alone and was independent with ADLs and functional mobility, although she admits to cruising along furniture at times. Pt required Min guard/Supervision for ADLs without use of DME, however was at [employer] Independence with use of RW. Encouraged pt to utilize RW at home for increased safety. Discussed fall prevention and importance of reducing fall risk due to age and risk factors. Pt reports she has family who can stay with her 24/7 initially. Feel that pt is close to baseline, and pt is d/c this afternoon. Acute OT to sign off.     Follow Up Recommendations  No OT follow up;Supervision/Assistance - 24 hour    Equipment Recommendations  None recommended by OT       Precautions / Restrictions Precautions Precautions: Fall Restrictions Weight Bearing Restrictions: No      Mobility Bed Mobility Overal bed mobility: Modified Independent                Transfers Overall transfer level: Modified independent Equipment used: Rolling walker (2 wheeled);None Transfers: Sit to/from Stand Sit to Stand: Supervision         General transfer comment: Pt is min guard for sit<>stand without DME and mod Independent with use of RW. Pt demonstrated good hand placement.          ADL Overall ADL's : Modified independent;At baseline                                       General ADL Comments: Pt is overall mod Independent for ADLs with use of RW. Without RW, requires min guard due to LOB. Encouraged pt to use RW at home for ADLs. Pt able to cross legs to  don/doff Bil socks. Pt reports that she sponge bathes and she is fine continuing this way. Encouraged pt to perform ADLs while seated for increased safety, which pt reports she already does.      Vision  Pt wears glasses (bifocals) at all times. Pt reports no change from baseline.  Vision briefly screened and found WNL.                  Perception Perception Perception Tested?: No   Praxis Praxis Praxis tested?: Within functional limits    Pertinent Vitals/Pain Pain Assessment: No/denies pain     Hand Dominance Right   Extremity/Trunk Assessment Upper Extremity Assessment Upper Extremity Assessment: Overall WFL for tasks assessed (5/5 strength throughout Bil UEs)   Lower Extremity Assessment Lower Extremity Assessment: Defer to PT evaluation   Cervical / Trunk Assessment Cervical / Trunk Assessment: Normal   Communication Communication Communication: No difficulties   Cognition Arousal/Alertness: Awake/alert Behavior During Therapy: WFL for tasks assessed/performed Overall Cognitive Status: Within Functional Limits for tasks assessed                                Home Living Family/patient expects to be discharged to:: Private residence Living Arrangements: Alone Available Help at Discharge:  Family;Available 24 hours/day Type of Home: House Home Access: Stairs to enter [employer] of Steps: 4 Entrance Stairs-Rails: Right;Left Home Layout: Two level;Laundry or work area in Artist of Steps: 4 Alternate Level Stairs-Rails: Right Bathroom Shower/Tub: Tub/shower unit Shower/tub characteristics: Engineer, building services: Standard     Home Equipment: Environmental consultant - 2 wheels;Wheelchair - manual;Cane - single point;Bedside commode   Additional Comments: Pt reports that she takes sponge baths as she does not feel it is safe to get into tub.       Prior Functioning/Environment Level of Independence: Independent   Gait / Transfers Assistance Needed: Pt reports that she furniture cruises at times, but relays that she wants to be more independent and not "rely on equipment." Pt reports previously using RW until she felt that her balance was good enough to discontinue. Encouraged pt to use RW for increased safety at home.  ADL's / Homemaking Assistance Needed: Pt sponge bathes. She reports that family provides transportation.                                   End of Session Equipment Utilized During Treatment: Gait belt;Rolling walker  Activity Tolerance: Patient tolerated treatment well Patient left: in chair;with call bell/phone within reach;with chair alarm set   Time: 1610-9604 OT Time Calculation (min): 26 min Charges:  OT General Charges $OT Visit: 1 Procedure OT Evaluation $Initial OT Evaluation Tier I: 1 Procedure OT Treatments $Self Care/Home Management : 8-22 mins  Rae Lips [phone] 12/16/2013, 12:26 PM

## 2013-12-16 NOTE — Progress Notes (Signed)
[facility] PROGRESS NOTE Assessment/Plan: Acute left CVA (cerebral infarction) - not on antiplatelets prior to admission. Appreciate neurology assistance. - not a candidate for tPA. - HgbA1c < 6.0, fasting lipid panel  HDL < 40, LDL < 70 TG's >240 - MRI, MRA of the brain without contrast showed acute stroke. - Echocardiogram, lower ext dupplex  pending - Prophylactic therapy-Antiplatelet med: Aspirin - dose 325 mg PO daily    Peripheral arterial disease -  Check x-ray of left foot.  - MRI left foot.Outpatient follow up with [facility] and vascular. Aspirin  Normocytic Anemia: - Likely secondary to chronic kidney disease. Stable.  Diabetes mellitus: - last HbgA1c 5.6 one year ago.  ESRD on dialysis: - Scheduled dialysis in a.m  Secondary hyperparathyroidism:  - Continue home medications  Code Status: Full  Family Communication: Discussed with patient's nephew and niece at bedside.  Disposition Plan: Home when medically stable.     Consultants:  Neuro  Procedures: MRI /MRA 8.8.2015: Acute 4.5 mm nonhemorrhagic infarct within the lateral left thalamus were posterior limb internal capsule.  Extensive periventricular and subcortical white matter change bilaterally is likely related to microvascular ischemic change.  Remote lacunar infarcts of the thalami bilaterally. 4. Remote infarcts of the right cerebellum and brachium pontis.  The MRA demonstrates mild atherosclerotic changes within the cavernous carotid arteries and distal right vertebral artery without  focal stenosis.    Antibiotics:  none  HPI/Subjective: Feel better right side weakness and numbness improving  Objective: Filed Vitals:   12/15/13 2156 12/16/13 0105 12/16/13 0531 12/16/13 1051  BP: 138/63 122/88 133/51 129/62  Pulse: 102 65 86 83  Temp: 98.9 F (37.2 C) 99.8 F (37.7 C) 98.8 F (37.1 C) 98.2 F (36.8 C)  TempSrc: Oral Oral Oral Oral  Resp: 16 16 16 20   Height:        Weight:      SpO2: 100% 100% 100% 99%    Intake/Output Summary (Last 24 hours) at 12/16/13 1214 Last data filed at 12/15/13 1908  Gross per 24 hour  Intake      0 ml  Output   2100 ml  Net  -2100 ml   Filed Weights   12/14/13 1230 12/15/13 1908  Weight: 91.627 kg (202 lb) 90.2 kg (198 lb 13.7 oz)    Exam:  General: Alert, awake, oriented x3, in no acute distress.  HEENT: No bruits, no goiter.  Heart: Regular rate and rhythm. Lungs: Good air movement, clear Neuro: Grossly intact, nonfocal.   Data Reviewed: Basic Metabolic Panel:  Recent Labs Lab 12/14/13 1253 12/14/13 1323 12/16/13 0416  NA 142 140 138  K 4.2 3.9 4.2  CL 100 101 96  CO2 26  --  28  GLUCOSE 98 100* 141*  BUN 19 21 15   CREATININE 6.26* 7.20* 5.23*  CALCIUM 8.6  --  8.5  PHOS  --   --  3.7   Liver Function Tests:  Recent Labs Lab 12/14/13 1253 12/16/13 0416  AST 15  --   ALT 7  --   ALKPHOS 82  --   BILITOT 0.4  --   PROT 7.3  --   ALBUMIN 3.2* 2.7*   No results found for this basename: LIPASE, AMYLASE,  in the last 168 hours No results found for this basename: AMMONIA,  in the last 168 hours CBC:  Recent Labs Lab 12/14/13 1253 12/14/13 1323 12/16/13 0416  WBC 11.2*  --  11.9*  NEUTROABS 8.3*  --   --  HGB 10.0* 12.2 8.9*  HCT 34.3* 36.0 30.4*  MCV 82.5  --  80.6  PLT 237  --  196   Cardiac Enzymes: No results found for this basename: CKTOTAL, CKMB, CKMBINDEX, TROPONINI,  in the last 168 hours BNP (last 3 results) No results found for this basename: PROBNP,  in the last 8760 hours CBG:  Recent Labs Lab 12/15/13 0646 12/15/13 1148 12/15/13 2100 12/16/13 0638 12/16/13 1130  GLUCAP 131* 105* 185* 142* 166*    Recent Results (from the past 240 hour(s))  MRSA PCR SCREENING     Status: None   Collection Time    12/14/13  6:56 PM      Result Value Ref Range Status   MRSA by PCR NEGATIVE  NEGATIVE Final   Comment:            The GeneXpert MRSA Assay (FDA      approved for NASAL specimens     only), is one component of a     comprehensive MRSA colonization     surveillance program. It is not     intended to diagnose MRSA     infection nor to guide or     monitor treatment for     MRSA infections.     Studies: Dg Chest 2 View  12/14/2013   CLINICAL DATA:  Stroke  EXAM: CHEST  2 VIEW  COMPARISON:  11/02/2012  FINDINGS: Limited inspiratory effect. Stable elevated right diaphragm. Mild cardiac enlargement, which is stable. Vascular pattern is normal. Mild discoid atelectasis in the lingula. No pleural effusions.  IMPRESSION: No significant acute findings.   Electronically Signed   By: Esperanza Heir M.D.   On: 12/14/2013 20:50   Ct Head Wo Contrast  12/14/2013   CLINICAL DATA:  Stroke.  EXAM: CT HEAD WITHOUT CONTRAST  TECHNIQUE: Contiguous axial images were obtained from the base of the skull through the vertex without intravenous contrast.  COMPARISON:  None.  FINDINGS: No mass. No hydrocephalus. No hemorrhage. Diffuse cerebral and cerebellar atrophy. White matter changes noted consistent with chronic ischemia. No acute bony abnormality. Subtle lucencies noted throughout the calvarium. Metastatic disease or myeloma cannot be completely excluded. Chronic anemia cannot be excluded.  IMPRESSION: 1. Diffuse cerebral cerebellar atrophy. White matter changes consistent with chronic ischemia. No acute intracranial abnormality. 2. Subtle lucencies noted throughout the skull. Subtle changes of metastatic disease, myeloma, or chronic anemia cannot be excluded. Clinical correlation suggested.   Electronically Signed   By: Maisie Fus  Register   On: 12/14/2013 13:59   Mr Maxine Glenn Head Wo Contrast  12/14/2013   CLINICAL DATA:  New right-sided weakness.  EXAM: MRI HEAD WITHOUT CONTRAST  MRA HEAD WITHOUT CONTRAST  TECHNIQUE: Multiplanar, multiecho pulse sequences of the brain and surrounding structures were obtained without intravenous contrast. Angiographic images of the head were  obtained using MRA technique without contrast.  COMPARISON:  CT head without contrast from the same day.  FINDINGS: MRI HEAD FINDINGS  A 4.5 mm focus of restricted diffusion is present in the lateral left thalamus or posterior limb internal capsule. Associated T2 changes are noted. Additional remote lacunar infarcts are present in the thalami bilaterally. Extensive periventricular and subcortical white matter change is present bilaterally. White matter changes extend into the brainstem. Extensive remote infarcts are present within the right cerebellum and brachium pontis.  Flow is present in the major intracranial arteries. The globes and orbits are intact. The paranasal sinuses and mastoid air cells are clear.  MRA HEAD  FINDINGS  Tortuosity is present in the cervical left internal carotid artery. There is mild atherosclerotic changes within the cavernous segments bilaterally. No focal stenosis is present. The A1 and M1 segments are normal. The anterior communicating artery is patent. The MCA bifurcations are intact. There is segmental attenuation of MCA branch vessels bilaterally.  The left vertebral artery is the dominant vessel. Atherosclerotic irregularity is present in the distal right vertebral artery without significant stenosis. The PICA origins are visualized bilaterally. The basilar artery is normal. Both posterior cerebral arteries originate from the basilar tip. There is moderate attenuation of distal PCA branch vessels bilaterally.  IMPRESSION: 1. Acute 4.5 mm nonhemorrhagic infarct within the lateral left thalamus were posterior limb internal capsule. 2. Extensive periventricular and subcortical white matter change bilaterally is likely related to microvascular ischemic change. 3. Remote lacunar infarcts of the thalami bilaterally. 4. Remote infarcts of the right cerebellum and brachium pontis. 5. The MRA demonstrates mild atherosclerotic changes within the cavernous carotid arteries and distal right  vertebral artery without focal stenosis. 6. Moderate medium and small vessel disease in both the anterior and posterior circulation.   Electronically Signed   By: Gennette Pac M.D.   On: 12/14/2013 16:35   Mr Brain Wo Contrast  12/14/2013   CLINICAL DATA:  New right-sided weakness.  EXAM: MRI HEAD WITHOUT CONTRAST  MRA HEAD WITHOUT CONTRAST  TECHNIQUE: Multiplanar, multiecho pulse sequences of the brain and surrounding structures were obtained without intravenous contrast. Angiographic images of the head were obtained using MRA technique without contrast.  COMPARISON:  CT head without contrast from the same day.  FINDINGS: MRI HEAD FINDINGS  A 4.5 mm focus of restricted diffusion is present in the lateral left thalamus or posterior limb internal capsule. Associated T2 changes are noted. Additional remote lacunar infarcts are present in the thalami bilaterally. Extensive periventricular and subcortical white matter change is present bilaterally. White matter changes extend into the brainstem. Extensive remote infarcts are present within the right cerebellum and brachium pontis.  Flow is present in the major intracranial arteries. The globes and orbits are intact. The paranasal sinuses and mastoid air cells are clear.  MRA HEAD FINDINGS  Tortuosity is present in the cervical left internal carotid artery. There is mild atherosclerotic changes within the cavernous segments bilaterally. No focal stenosis is present. The A1 and M1 segments are normal. The anterior communicating artery is patent. The MCA bifurcations are intact. There is segmental attenuation of MCA branch vessels bilaterally.  The left vertebral artery is the dominant vessel. Atherosclerotic irregularity is present in the distal right vertebral artery without significant stenosis. The PICA origins are visualized bilaterally. The basilar artery is normal. Both posterior cerebral arteries originate from the basilar tip. There is moderate attenuation of  distal PCA branch vessels bilaterally.  IMPRESSION: 1. Acute 4.5 mm nonhemorrhagic infarct within the lateral left thalamus were posterior limb internal capsule. 2. Extensive periventricular and subcortical white matter change bilaterally is likely related to microvascular ischemic change. 3. Remote lacunar infarcts of the thalami bilaterally. 4. Remote infarcts of the right cerebellum and brachium pontis. 5. The MRA demonstrates mild atherosclerotic changes within the cavernous carotid arteries and distal right vertebral artery without focal stenosis. 6. Moderate medium and small vessel disease in both the anterior and posterior circulation.   Electronically Signed   By: Gennette Pac M.D.   On: 12/14/2013 16:35   Dg Foot Complete Left  12/14/2013   CLINICAL DATA:  Left foot pain, diabetes, chronic osteomyelitis  versus dry gangrene left great toe  EXAM: LEFT FOOT - COMPLETE 3+ VIEW  COMPARISON:  None.  FINDINGS: Diffuse osteopenia. Extensive small vessel calcification. There is air in the soft tissues at the tip of the great toe. There are areas with a cortex of the tip of the distal phalanx is indistinct. There is a possible fracture involving the base of the third proximal phalanx, but the margins are sclerotic suggesting that it is not acute.  IMPRESSION: Equivocal findings suggesting the possibility of osteomyelitis of the first distal phalanx in the setting of air in the surrounding soft tissues. Consider further evaluation with MRI.   Electronically Signed   By: Esperanza Heir M.D.   On: 12/14/2013 20:54    Scheduled Meds: . aspirin  300 mg Rectal Daily   Or  . aspirin  325 mg Oral Daily  . cinacalcet  30 mg Oral QPM  . doxercalciferol  3 mcg Intravenous Q T,Th,Sa-HD  . gabapentin  100 mg Oral BID  . heparin  5,000 Units Subcutaneous 3 times per day  . lanthanum  2,000 mg Oral TID WC  . metroNIDAZOLE  250 mg Oral TID  . multivitamin  1 tablet Oral QHS  . pantoprazole  40 mg Oral Daily    Continuous Infusions:    Marinda Elk  [facility] Pager [phone]. If 8PM-8AM, please contact night-coverage at [email or web address], password [facility] 12/16/2013, 12:14 PM  LOS: 2 days      **Disclaimer: This note may have been dictated with voice recognition software. Similar sounding words can inadvertently be transcribed and this note may contain transcription errors which may not have been corrected upon publication of note.**

## 2013-12-17 ENCOUNTER — Inpatient Hospital Stay (HOSPITAL_COMMUNITY): Payer: Medicare Other

## 2013-12-17 LAB — GLUCOSE, CAPILLARY
Glucose-Capillary: 109 mg/dL — ABNORMAL HIGH (ref 70–99)
Glucose-Capillary: 218 mg/dL — ABNORMAL HIGH (ref 70–99)
Glucose-Capillary: 130 mg/dL — ABNORMAL HIGH (ref 70–99)

## 2013-12-17 MED ORDER — CIPROFLOXACIN HCL 250 MG PO TABS: 250.0000 mg | ORAL_TABLET | Freq: Two times a day (BID) | ORAL | Status: DC

## 2013-12-17 MED ORDER — DOXYCYCLINE HYCLATE 100 MG PO CAPS: 100.0000 mg | ORAL_CAPSULE | Freq: Two times a day (BID) | ORAL | Status: DC

## 2013-12-17 NOTE — Progress Notes (Signed)
Stroke Team Progress Note  HISTORY [name] is a 80 y.o. female who has known chronic right sided weakness. Family stated the weakness has been present since 2008-2009 and stated she came to [facility] hospital. Due to electronic chart conversion notes could not be found for her stroke at that time. MRI could not be found from that time.   Patient currently lives alone on a first floor apartment and cares for herself. On the day of admission she woke up and noted she felt off balance with her gait. She denied leaning to one side or the other. She also endorsed right facial "hot sensation" along the V2-V3 region which then became decreased sensation. When seen she still felt weak on the right arm and leg with decreased sensation on the right arm and leg (this is new). She also continued to feel decreased sensation on the right lower face.  She does not take ASA or Plavix.  She did note that she awoke at 4 AM and felt fine at that time after going to the bathroom. When she awoke at 8 AM she was off balance.   Date last known well: 08/07  Time last known well: 4 am  tPA Given: No: out of window    SUBJECTIVE No family present. Pt feeling good and wants to go home. Strength has improved.MRI foot pending  OBJECTIVE Most recent Vital Signs: Filed Vitals:   12/16/13 2050 12/17/13 0207 12/17/13 0642 12/17/13 1006  BP: 109/50 109/54 134/49 122/47  Pulse: 98 92 82 89  Temp: 98.6 F (37 C) 98.2 F (36.8 C) 98.2 F (36.8 C) 98.2 F (36.8 C)  TempSrc: Oral Oral Oral Oral  Resp: 20 18 18 20   Height:      Weight:      SpO2: 95% 99% 100% 100%   CBG (last 3)   Recent Labs  12/16/13 2053 12/17/13 0657 12/17/13 1140  GLUCAP 174* 109* 218*    IV Fluid Intake:     MEDICATIONS  . aspirin  300 mg Rectal Daily   Or  . aspirin  325 mg Oral Daily  . cinacalcet  30 mg Oral QPM  . doxercalciferol  3 mcg Intravenous Q T,Th,Sa-HD  . gabapentin  100 mg Oral BID  . heparin  5,000 Units  Subcutaneous 3 times per day  . lanthanum  2,000 mg Oral TID WC  . metroNIDAZOLE  250 mg Oral TID  . multivitamin  1 tablet Oral QHS  . pantoprazole  40 mg Oral Daily   PRN:  acetaminophen, acetaminophen, lanthanum  Diet:  Diabetic renal with thin liquids Activity:  Up with assistance DVT Prophylaxis:  [place] Heparin  CLINICALLY SIGNIFICANT STUDIES Basic Metabolic Panel:   Recent Labs Lab 12/14/13 1253 12/14/13 1323 12/16/13 0416  NA 142 140 138  K 4.2 3.9 4.2  CL 100 101 96  CO2 26  --  28  GLUCOSE 98 100* 141*  BUN 19 21 15   CREATININE 6.26* 7.20* 5.23*  CALCIUM 8.6  --  8.5  PHOS  --   --  3.7   Liver Function Tests:   Recent Labs Lab 12/14/13 1253 12/16/13 0416  AST 15  --   ALT 7  --   ALKPHOS 82  --   BILITOT 0.4  --   PROT 7.3  --   ALBUMIN 3.2* 2.7*   CBC:   Recent Labs Lab 12/14/13 1253 12/14/13 1323 12/16/13 0416  WBC 11.2*  --  11.9*  NEUTROABS  8.3*  --   --   HGB 10.0* 12.2 8.9*  HCT 34.3* 36.0 30.4*  MCV 82.5  --  80.6  PLT 237  --  196   Coagulation:   Recent Labs Lab 12/14/13 1253  LABPROT 12.7  INR 0.95   Cardiac Enzymes: No results found for this basename: CKTOTAL, CKMB, CKMBINDEX, TROPONINI,  in the last 168 hours Urinalysis: No results found for this basename: COLORURINE, APPERANCEUR, LABSPEC, PHURINE, GLUCOSEU, HGBUR, BILIRUBINUR, KETONESUR, PROTEINUR, UROBILINOGEN, NITRITE, LEUKOCYTESUR,  in the last 168 hours Lipid Panel    Component Value Date/Time   CHOL 127 12/15/2013 0421   TRIG 241* 12/15/2013 0421   HDL 35* 12/15/2013 0421   CHOLHDL 3.6 12/15/2013 0421   VLDL 48* 12/15/2013 0421   LDLCALC 44 12/15/2013 0421   HgbA1C  Lab Results  Component Value Date   HGBA1C 5.8* 12/15/2013    Urine Drug Screen:   No results found for this basename: labopia,  cocainscrnur,  labbenz,  amphetmu,  thcu,  labbarb    Alcohol Level:   Recent Labs Lab 12/14/13 1253  ETH <11    Dg Chest 2 View 12/14/2013    No significant acute findings.     Ct Head Wo Contrast 12/14/2013    1. Diffuse cerebral cerebellar atrophy. White matter changes consistent with chronic ischemia. No acute intracranial abnormality. 2. Subtle lucencies noted throughout the skull. Subtle changes of metastatic disease, myeloma, or chronic anemia cannot be excluded. Clinical correlation suggested.     MRI / Mra Head Wo Contrast 12/14/2013    1. Acute 4.5 mm nonhemorrhagic infarct within the lateral left thalamus were posterior limb internal capsule.  2. Extensive periventricular and subcortical white matter change bilaterally is likely related to microvascular ischemic change.  3. Remote lacunar infarcts of the thalami bilaterally.  4. Remote infarcts of the right cerebellum and brachium pontis.  5. The MRA demonstrates mild atherosclerotic changes within the cavernous carotid arteries and distal right vertebral artery without focal stenosis.  6. Moderate medium and small vessel disease in both the anterior and posterior circulation.      Dg Foot Complete Left 12/14/2013    Equivocal findings suggesting the possibility of osteomyelitis of the first distal phalanx in the setting of air in the surrounding soft tissues. Consider further evaluation with MRI.      Carotid Doppler - Pending  2D Echocardiogram - ejection fraction 55-60%. Apparent small PFO by color Doppler.  CXR  No significant acute findings. LE Venous Doppler : No evidence of deep vein thrombosis involving the visualized veins of the right lower extremity. - No evidence of deep vein thrombosis involving the visualized veins of the left lower extremity.    EKG - SR rate 75 BPM - For complete results please see formal report.   Therapy Recommendations - HH PT recommended. OT eval pending.  Physical Exam  Mental Status:  Alert, oriented, thought content appropriate. Speech fluent without evidence of aphasia. Able to follow 3 step commands without difficulty.  Cranial Nerves:  II: Discs not  seen; Visual fields grossly normal, pupils equal, round, reactive to light and accommodation  III,IV, VI: ptosis not present, extra-ocular motions intact bilaterally  V,VII: smile symmetric, facial light touch sensation decreased on the right lower face  VIII: hearing normal bilaterally  IX,X: gag reflex present  XI: bilateral shoulder shrug  XII: midline tongue extension without atrophy or fasciculations  Motor:  Right : Upper extremity 4+/5 Left:Orbits  Upper extremity 5/5 orbits  left over right upper extremity. Diminished fine finger movements on the right. Lower extremity 4+/5 Lower extremity 5/5  Tone and bulk:normal tone throughout; no atrophy noted  Sensory: Pinprick and light touch decreased on the right arm and leg  Deep Tendon Reflexes:  Right: Upper Extremity Left: Upper extremity  biceps (C-5 to C-6) 2/4 biceps (C-5 to C-6) 2/4  tricep (C7) 2/4 triceps (C7) 2/4  Brachioradialis (C6) 2/4 Brachioradialis (C6) 2/4  Lower Extremity Lower Extremity  quadriceps (L-2 to L-4) 1/4 quadriceps (L-2 to L-4) 1/4  Achilles (S1) 0/4 Achilles (S1) 0/4  Plantars:  Mute bilaterally  Cerebellar:  normal finger-to-nose, Ataxic right heel-to-shin test  Gait: not tested   ASSESSMENT [name] is a 79 y.o. female presenting with right hemiparesis and right hemisensory deficits. Out of window for TPA. MRI - acute 4.5 mm nonhemorrhagic infarct within the lateral left thalamus were posterior limb internal capsule. Infarct felt to be thrombotic secondary to small vessel disease.. On no antithrmbotics prior to admission. Now on aspirin 325 mg orally every day for secondary stroke prevention. Patient with resultant residual right sided numbness. Stroke work up underway.   Chol 127; LDL 44 - no statin PTA  Possible osteomyelitis left foot  ESRD - dialysis   Diabetes Mellitus - HbgA1C 5.7  Previous strokes  Gout hx  Apparent small PFO by 2D echo color Doppler.  Left foot xray -  equivocal findings suggesting the possibility of osteomyelitis of the first distal phalanx   Hospital day # 3  TREATMENT/PLAN  Continue aspirin 325 mg orally every day for secondary stroke prevention.  Await therapists evals.- Physical therapist recommends home health physical therapy. No followup speech. OT pending.  Stroke team will sign off .Kindly call for questions.F/U with Dr Roda Shutters in Stroke Clinic in 4-6 weeks Delia Heady, MD Medical Director [facility] Pager: [phone] 12/17/2013 12:33 PM      To contact Stroke Continuity provider, please refer to [email or web address]. After hours, contact General Neurology

## 2013-12-17 NOTE — Care Management Note (Addendum)
  Page 1 of 1   12/17/2013     11:18:53 AM CARE MANAGEMENT NOTE 12/17/2013  Patient:  [name], [name]   Account Number:  [id number]  Date Initiated:  12/17/2013  Documentation initiated by:  Lorne Skeens  Subjective/Objective Assessment:   Patient was admitted with CVA. Lives at home alone.     Action/Plan:   Will follow for discharge needs pending PT/OT evals and physician orders.   Anticipated DC Date:  12/17/2013   Anticipated DC Plan:  GROUP HOME         Choice offered to / List presented to:             Status of service:  In process, will continue to follow Medicare Important Message given?  YES (If response is "NO", the following Medicare IM given date fields will be blank) Date Medicare IM given:  12/17/2013 Medicare IM given by:  Lorne Skeens Date Additional Medicare IM given:   Additional Medicare IM given by:    Discharge Disposition:    Per UR Regulation:  Reviewed for med. necessity/level of care/duration of stay  If discussed at [place] of Stay Meetings, dates discussed:    Comments:  12/17/13 [place], MSN, CM- Met with patient to provide Medicare IM letter.

## 2013-12-17 NOTE — Progress Notes (Signed)
[facility] Progress Note  Assessment/Plan: 1. Acute left non-hemorrhagic CVA (on a background of probable old strokes to which pt had adapted over time) - Mgmt per neuro. Antiplatelet therapy. OT,PT,speech 2. ESRD - TTS, HD Tuesday first round if not d/c 3. Hypertension/volume - SBPs 100s-110s. Up ~2kg. Trace LE edema. CXR clear. UF to edw as tolerated. 4. Anemia - Hgb variable here. On outpt Aranesp 60 dosed 12/13/13. Ferritin >1220. Recheck CBC 5. Metabolic bone disease - Ca 8.6 (9.2 corrected). Phos poorly controlled outpt. PTH in range. Continue binders/Vit D/Sensipar 6. Nutrition - Renal carb mod/ multivit 7. DM - per primary 8. PAD/Left foot pain - Per primary. Followed outpt by podiatry. Plain films here with ? osteo left great toe.  9. Subtle lucencies of the calvarium on CT - myeloma in DDX and I found an SPEP from 2008 with faint restricted band. SPEP in process 10. Disp - pt states she is being d/c today after MRI - I have my reservations about pt going home ALONE given recent CVA and the fact she has 3 steps   Sheffield SliderMartha B Bergman, PA-C [facility] Beeper [phone] 12/17/2013,11:51 AM  LOS: 3 days   Pt seen, examined and agree w A/P as above.  Vinson Moselleob Temesha Queener MD pager [phone]    cell [phone] 12/17/2013, 2:58 PM    Subjective:   Wants to go home.  Objective Filed Vitals:   12/16/13 2050 12/17/13 0207 12/17/13 0642 12/17/13 1006  BP: 109/50 109/54 134/49 122/47  Pulse: 98 92 82 89  Temp: 98.6 F (37 C) 98.2 F (36.8 C) 98.2 F (36.8 C) 98.2 F (36.8 C)  TempSrc: Oral Oral Oral Oral  Resp: 20 18 18 20   Height:      Weight:      SpO2: 95% 99% 100% 100%   Physical Exam General: NAD talkative, alert, speech slightly slurred Heart: RRR Lungs: no rales Abdomen: obese soft Extremities: Tr LE edema left great toe dried blood on tip Dialysis Access: right thigh graft  Dialysis Orders: Center:[facility] on TTS .  EDW 90kg HD Bath 2K/2.25Ca  Time 4hr Heparin 5000 bolus/2000 mid. Access Rt fem AVG 450/800  Hectorol 3 mcg IV/HD Aranesp 60 q Thurs Venofer 0  Recent labs: Hgb 9.3. Tsat 25%. Ferr 1298. Phos 8.8, PTH 180  Additional Objective Labs: Basic Metabolic Panel:  Recent Labs Lab 12/14/13 1253 12/14/13 1323 12/16/13 0416  NA 142 140 138  K 4.2 3.9 4.2  CL 100 101 96  CO2 26  --  28  GLUCOSE 98 100* 141*  BUN 19 21 15   CREATININE 6.26* 7.20* 5.23*  CALCIUM 8.6  --  8.5  PHOS  --   --  3.7   Liver Function Tests:  Recent Labs Lab 12/14/13 1253 12/16/13 0416  AST 15  --   ALT 7  --   ALKPHOS 82  --   BILITOT 0.4  --   PROT 7.3  --   ALBUMIN 3.2* 2.7*   CBC:  Recent Labs Lab 12/14/13 1253 12/14/13 1323 12/16/13 0416  WBC 11.2*  --  11.9*  NEUTROABS 8.3*  --   --   HGB 10.0* 12.2 8.9*  HCT 34.3* 36.0 30.4*  MCV 82.5  --  80.6  PLT 237  --  196  CBG:  Recent Labs Lab 12/16/13 1130 12/16/13 1627 12/16/13 2053 12/17/13 0657 12/17/13 1140  GLUCAP 166* 153* 174* 109* 218*  Medications:   . aspirin  300 mg Rectal  Daily   Or  . aspirin  325 mg Oral Daily  . cinacalcet  30 mg Oral QPM  . doxercalciferol  3 mcg Intravenous Q T,Th,Sa-HD  . gabapentin  100 mg Oral BID  . heparin  5,000 Units Subcutaneous 3 times per day  . lanthanum  2,000 mg Oral TID WC  . metroNIDAZOLE  250 mg Oral TID  . multivitamin  1 tablet Oral QHS  . pantoprazole  40 mg Oral Daily

## 2013-12-17 NOTE — Progress Notes (Signed)
Physical Therapy Treatment Patient Details Name: JAMILEE LAFOSSE MRN: [id number] DOB: 04/06/1933 Today's Date: 12/17/2013    History of Present Illness Patient is a 80 yo female admitted 12/14/13 with Rt-sided weakness, slurred speech, and feeling "off balance".  MRI - acute left non-hemorrhagic infarct.  PMH: ESRD on HD TTS, DM, peripheral neuropathy, CVA, PAD, depression    PT Comments    Pt was seen for impending visit of discharge home.  Planned with pt for outpatient as she thought no HHPT was ordered by MD.  As PT previously recommended, This therapist concurs and feels pt at fall risk without assistance 24/7.  Pt will have sporadic help per her understanding, but is in need of supervision to manage her steps with only a banister to ascend 3.  Will recommend she go home with only continual care and with HHPT.  Follow Up Recommendations  Home health PT;Supervision/Assistance - 24 hour     Equipment Recommendations  None recommended by PT    Recommendations for Other Services Other (comment) (Outpatient therapy recommended)     Precautions / Restrictions Precautions Precautions: Fall Restrictions Weight Bearing Restrictions: No    Mobility  Bed Mobility Overal bed mobility: Modified Independent Bed Mobility: Supine to Sit;Rolling Rolling: Modified independent (Device/Increase time)   Supine to sit: Modified independent (Device/Increase time)     General bed mobility comments: Using her bed rail to sit up bedside, has hospital bed per pt at home  Transfers Overall transfer level: Modified independent Equipment used: Rolling walker (2 wheeled) Transfers: Sit to/from [employer] Sit to Stand: Supervision Stand pivot transfers: Supervision       General transfer comment: Pt is min guard for sit<>stand without DME and mod Independent with use of RW. Pt demonstrated good hand placement.   Ambulation/Gait Ambulation/Gait assistance: Supervision Ambulation  Distance (Feet): 115 Feet Assistive device: Rolling walker (2 wheeled) Gait Pattern/deviations: Step-through pattern;Wide base of support;Decreased step length - right;Decreased step length - left Gait velocity: Decreased Gait velocity interpretation: Below normal speed for age/gender General Gait Details: Wide base with lateral shift without assistive device, controlled with RW.  Pt advised not to get up to walk without help and RW.   Stairs Stairs: Yes Stairs assistance: Mod assist Stair Management: No rails Number of Stairs: 10 General stair comments: Min guarding with reciprocal pattern with B rails, min guard with one step at a time with one rail and mod assist with HHA as pt does with family where rails not available   Wheelchair Mobility    Modified Rankin (Stroke Patients Only) Modified Rankin (Stroke Patients Only) Pre-Morbid Rankin Score: Slight disability Modified Rankin: Moderate disability     Balance Overall balance assessment: Needs assistance Sitting-balance support: Feet supported Sitting balance-Leahy Scale: Good   Postural control: Right lateral lean;Left lateral lean;Other (comment) (without the walker) Standing balance support: Bilateral upper extremity supported Standing balance-Leahy Scale: Fair                      Cognition Arousal/Alertness: Awake/alert Behavior During Therapy: WFL for tasks assessed/performed Overall Cognitive Status: Within Functional Limits for tasks assessed                      Exercises      General Comments        Pertinent Vitals/Pain Pain Assessment: No/denies pain.  BP was 134/49 with pulse 82, O2 sat 100% per nsg.    Home Living  Prior Function            PT Goals (current goals can now be found in the care plan section) Acute Rehab PT Goals Patient Stated Goal: To go home and planning dialysis tomorrow Progress towards PT goals: Progressing toward goals     Frequency  Min 4X/week    PT Plan Current plan remains appropriate    Co-evaluation             End of Session Equipment Utilized During Treatment: Gait belt Activity Tolerance: Patient tolerated treatment well Patient left: in chair;with call bell/phone within reach;Other (comment) (bed alarm off)     Time: [id number] PT Time Calculation (min): 27 min  Charges:  $Gait Training: 8-22 mins $Therapeutic Activity: 8-22 mins                    G Codes:      Ivar DrapeStout, Sherrill Buikema E 12/17/2013, 9:43 AM  Samul Dadauth Davene Jobin, PT MS Acute Rehab Dept. Number: [phone]

## 2013-12-17 NOTE — Progress Notes (Signed)
Pt A&O x4; pt discharge education and instructions completed with pt and family members at side. All voices understanding and denies any questions. Pt IV and telemetry removed; pt provided handout education on stroke; pt handed her prescription for doxycycline and Ciprofloxacin; Pt discharge home with family to transport pt home; pt transported off unit via wheelchair with family and belongings at side. Arabella MerlesP. Amo Avis Mcmahill RN.

## 2013-12-18 LAB — PROTEIN ELECTROPHORESIS, SERUM
ALBUMIN ELP: 49.9 % — AB (ref 55.8–66.1)
Alpha-1-Globulin: 6.1 % — ABNORMAL HIGH (ref 2.9–4.9)
Alpha-2-Globulin: 11.9 % — ABNORMAL HIGH (ref 7.1–11.8)
BETA GLOBULIN: 4.7 % (ref 4.7–7.2)
Beta 2: 6.6 % — ABNORMAL HIGH (ref 3.2–6.5)
Gamma Globulin: 20.8 % — ABNORMAL HIGH (ref 11.1–18.8)
M-SPIKE, %: 0.42 g/dL
TOTAL PROTEIN ELP: 5.7 g/dL — AB (ref 6.0–8.3)

## 2013-12-21 ENCOUNTER — Encounter: Payer: Self-pay | Admitting: Podiatry

## 2013-12-21 ENCOUNTER — Telehealth: Payer: Self-pay | Admitting: *Deleted

## 2013-12-21 ENCOUNTER — Ambulatory Visit (INDEPENDENT_AMBULATORY_CARE_PROVIDER_SITE_OTHER): Payer: Medicare Other | Admitting: Podiatry

## 2013-12-21 VITALS — BP 111/41 | HR 113 | Temp 99.4°F | Resp 18

## 2013-12-21 DIAGNOSIS — I6320 Cerebral infarction due to unspecified occlusion or stenosis of unspecified precerebral arteries: Secondary | ICD-10-CM | POA: Diagnosis not present

## 2013-12-21 DIAGNOSIS — I96 Gangrene, not elsewhere classified: Secondary | ICD-10-CM | POA: Diagnosis not present

## 2013-12-21 DIAGNOSIS — E1152 Type 2 diabetes mellitus with diabetic peripheral angiopathy with gangrene: Secondary | ICD-10-CM

## 2013-12-21 DIAGNOSIS — I739 Peripheral vascular disease, unspecified: Secondary | ICD-10-CM | POA: Diagnosis not present

## 2013-12-21 DIAGNOSIS — E1159 Type 2 diabetes mellitus with other circulatory complications: Secondary | ICD-10-CM

## 2013-12-21 NOTE — Patient Instructions (Signed)
Monitor for any signs/symptoms of infection. Go directly to the emergency room if any occur.

## 2013-12-21 NOTE — Telephone Encounter (Signed)
Need to speak to someone regarding this patient.  We do need to schedule her an appointment today.  Please give me a call back.

## 2013-12-21 NOTE — Progress Notes (Signed)
   Subjective:    Patient ID: [name], female    DOB: 10/14/1932, 80 y.o.   MRN: [id number]  HPI Patient presents to the office after being sent over by Dr. Laurine BlazerWalters for left hallux wound and osteomyelitis. Patient states that over the last month she has noticed increasing darkness of the toe as well as a wound appear. She recently was just discharged from the hospital after a stroke. While in the hospital x-ray and MRI were performed which revealed osteomyelitis of the distal tuft of the left hallux. Patient reports a small amount of "bloody" drainage from the toe. Denies any purulence.  She also has significant PAD. The patient was started on Cipro and Doxy at the hospital and was discharged in these medications. Her last A1c was 5.6. She is also wishing to have her right foot nails trimmed today. She has no other complaints at this time. Denies any systemic complaints such as fevers, chills, nausea, vomiting.     Review of Systems  Gastrointestinal: Positive for diarrhea.  Skin: Positive for wound.  All other systems reviewed and are negative.      Objective:   Physical Exam  Nursing note and vitals reviewed. Constitutional: She is oriented to person, place, and time. She appears well-developed and well-nourished.  Neurological: She is alert and oriented to person, place, and time.  Decreased protective vibratory sensation  Skin:  Left hallux gangrene with lifting of the nail and skin on the distal aspect of the digit. No underlying purulence. No fluctuance or crepitus. No surrounding erythema or any ascending cellulitis. Right hallux nail hypertrophic, brittle, yellow-brown discoloration. The keratotic lesion right submetatarsal 5.   Absent pedal pulses.         Assessment & Plan:  80 year old female with left hallux gangrene and significant PAD -X-rays and MRI were reviewed from the hospital. -Stressed at great length with the patient and her family the significance of the  gangrene and of bone infection. Discussed the patient will likely need at least a hallux amputation, if not a higher up amputation. However at this time we cannot perform this surgery given her recent medical events and her significant peripheral arterial disease. Patient is at high likelihood of further infection and it is imperative to keep a close watch over this. Signs and symptoms of infection were discussed the patient and her family in detail which they understand. Directed to go directly to the emergency room if any changes are to occur.  -Lightly debrided the loose skin at the distal aspect of the left hallux without complication to ensure no underlying pocket of infection.  -Betadine dressing changes for now.  -Continue with antibiotics (cipro/doxy) -I spoke to Dr. Gery PrayBarry and he will get the patient in to be evaluated. -Would appreciate neurology clearance once appropriate as the patient will need surgical invention. -Debrided right hallux nail, submetatarsal 5 keratotic lesion without complication. -Followup next week or sooner if any problems are to arise.

## 2013-12-24 ENCOUNTER — Telehealth: Payer: Self-pay | Admitting: Podiatry

## 2013-12-24 ENCOUNTER — Telehealth: Payer: Self-pay | Admitting: *Deleted

## 2013-12-24 NOTE — Telephone Encounter (Signed)
Called to follow up with the patient to see how she was doing, and I spoke to the her POA. She states that the toe looks "the same" as it did on Friday. Denies any fevers, chills, nausea, vomiting. She continues with the antibiotics. Does state that she could not sleep last night in the bed due to pain, but was able to sleep in a chair with her legs down. Discussed with her given her PAD she needs to call Dr. Hazle CocaBerry's office to follow up with him. If symptoms worsen, or any clinical signs of infection she should go directly to the emergency room. Call with any questions/concerns. Follow-up as scheduled.

## 2013-12-24 NOTE — Telephone Encounter (Signed)
She was there on Friday.  You all were supposed to call us this morning and let us know about the surgery.  You were supposed to call Dr. Allyson SabalBerry and let us know when you are going to do the surgery.  I dressed her foot and it doesn't look good at all.  Thank You!

## 2013-12-26 ENCOUNTER — Encounter: Payer: Self-pay | Admitting: Podiatry

## 2013-12-26 ENCOUNTER — Ambulatory Visit (INDEPENDENT_AMBULATORY_CARE_PROVIDER_SITE_OTHER): Payer: Medicare Other

## 2013-12-26 ENCOUNTER — Ambulatory Visit (INDEPENDENT_AMBULATORY_CARE_PROVIDER_SITE_OTHER): Payer: Medicare Other | Admitting: Podiatry

## 2013-12-26 VITALS — BP 92/66 | HR 61 | Resp 16

## 2013-12-26 DIAGNOSIS — I96 Gangrene, not elsewhere classified: Secondary | ICD-10-CM

## 2013-12-26 DIAGNOSIS — I739 Peripheral vascular disease, unspecified: Secondary | ICD-10-CM

## 2013-12-26 DIAGNOSIS — I6320 Cerebral infarction due to unspecified occlusion or stenosis of unspecified precerebral arteries: Secondary | ICD-10-CM

## 2013-12-26 DIAGNOSIS — E1159 Type 2 diabetes mellitus with other circulatory complications: Secondary | ICD-10-CM

## 2013-12-26 DIAGNOSIS — E1152 Type 2 diabetes mellitus with diabetic peripheral angiopathy with gangrene: Secondary | ICD-10-CM

## 2013-12-26 NOTE — Patient Instructions (Signed)
Continue with betadine dressing.  Monitor for any signs/symptoms of infection. Call the office immediately if any occur or go directly to the emergency room. Call with any questions/concerns.

## 2013-12-26 NOTE — Progress Notes (Signed)
Subjective:     Patient ID: [name], female   DOB: 05/26/1932, 80 y.o.   MRN: [id number]  HPI 80 year old female returns the office today for followup of left hallux gangrene and osteomyelitis. States that she continues with antibiotics. Denies any systemic complaints such as fevers, chills, nausea, vomiting. Has not been changing dressing and has been leaving uncovered.  Complains of pain while feet elevated or in bed however pain relieved with feet hanging off the side or sitting in a chair. Continues to have pain in right hallux. No new complaints.   Review of Systems  All other systems reviewed and are negative.      Objective:   Physical Exam Neurovascular status unchanged Hallux nail lifting with tissue loss to the distal aspect left hallux. There is no fluctuance, crepitus, erythema, ascending cellulitis. No purulence.  Right hallux with no open lesion. Nail is hypertrophic with no associated erythema, drainage, or clinical signs of infection.  No further ulceration noted b/l.     Assessment:     80 year old female with significant PAD and left hallux osteomyelitis.     Plan:     -X-rays were obtained which revealed osteomyelitis the distal aspect of the left hallux -And 10 years Betadine dressing changes daily  -Patient has appointment to followup with Dr. Allyson SabalBerry he tomorrow for evaluation -Gave the patient and her niece the number to the neurologist for followup after her stroke. Discussed with her that she most likely will require surgical intervention and will need clearance from her neurologist prior.  -Continue with current antibiotic therapy -Monitor for any clinical signs of infection and call the office immediately or go directly to the emergency room if any are to occur or symptoms worsen.

## 2013-12-27 ENCOUNTER — Ambulatory Visit (INDEPENDENT_AMBULATORY_CARE_PROVIDER_SITE_OTHER): Payer: Medicare Other | Admitting: Cardiovascular Disease

## 2013-12-27 ENCOUNTER — Encounter: Payer: Self-pay | Admitting: Cardiovascular Disease

## 2013-12-27 VITALS — BP 102/56 | HR 84 | Ht 61.0 in | Wt 195.9 lb

## 2013-12-27 DIAGNOSIS — I6320 Cerebral infarction due to unspecified occlusion or stenosis of unspecified precerebral arteries: Secondary | ICD-10-CM

## 2013-12-27 DIAGNOSIS — I739 Peripheral vascular disease, unspecified: Secondary | ICD-10-CM | POA: Diagnosis not present

## 2013-12-27 NOTE — Patient Instructions (Signed)
We request that you follow-up in: 6 months with an extender and in 1 year with Dr Berry  You will receive a reminder letter in the mail two months in advance. If you don't receive a letter, please call our office to schedule the follow-up appointment.   

## 2013-12-27 NOTE — Progress Notes (Signed)
12/27/2013 [name]   01-23-1933  [id number]  Primary Physician Emeterio Reeve, MD Primary Cardiologist: Runell Gess MD [name]   HPI:  [name] is a very pleasant 80 year old moderately overweight widowed [place] American female with no children who lives alone and is independent. Her primary care physician is Dr. Mila Palmer. She was referred by Dr. Charlsie Merles for peripheral vascular evaluation because of a mildly painful discolored left great toe. Her cardiovascular risk factor profile is only remarkable for diabetes. She has been hemodialysis dependent for 7 years. She's never had a heart attack or stroke, denies chest pain, shortness of breath or claudication. She did have a nonhealing wound on her left great toe last year which ultimately healed with aggressive local care. Lower extremity arterial Doppler studies performed in our office/3/15 revealed right ABI 1.1 and left of 0.75, was ordered which is slightly elevated because of vessel calcification. Her toe brachial indices were 0.32 on the right and. 27 on the left. She has zero vessel runoff bilaterally. Since I saw her last 10/26/13 she is to has been admitted to the hospital for a stroke. She returns for followup. On inspection it does not appear that her left great toe dry gangrenous changes have progressed. She did have a 2-D echocardiogram performed to auscultation that showed normal LV systolic function.    Current Outpatient Prescriptions  Medication Sig Dispense Refill  . aspirin 325 MG tablet Take 1 tablet (325 mg total) by mouth daily.      Marland Kitchen azithromycin (ZITHROMAX) 250 MG tablet       . ciprofloxacin (CIPRO) 250 MG tablet Take 1 tablet (250 mg total) by mouth 2 (two) times daily.  30 tablet  0  . doxercalciferol (HECTOROL) 4 MCG/2ML injection Inject 1.5 mLs (3 mcg total) into the vein Every Tuesday,Thursday,and Saturday with dialysis.  2 mL    . doxycycline (VIBRAMYCIN) 100 MG capsule Take 1  capsule (100 mg total) by mouth 2 (two) times daily.  30 capsule  0  . gabapentin (NEURONTIN) 100 MG capsule Take 100 mg by mouth 2 (two) times daily.       Marland Kitchen lanthanum (FOSRENOL) 1000 MG chewable tablet Chew 1,000-2,000 mg by mouth 3 (three) times daily with meals. Take 2000 mg by mouth 3 times daily with meals and take 1000mg  by mouth with snacks.      . metroNIDAZOLE (FLAGYL) 250 MG tablet       . omeprazole (PRILOSEC) 20 MG capsule Take 1 capsule (20 mg total) by mouth daily.  90 capsule  3  . SENSIPAR 30 MG tablet Take 30 mg by mouth every evening.        No current facility-administered medications for this visit.    Allergies  Allergen Reactions  . Codeine Hives  . Vancomycin Rash    History   Social History  . Marital Status: Divorced    Spouse Name: N/A    Number of Children: 0  . Years of Education: N/A   Occupational History  . retired    Social History Main Topics  . Smoking status: Former Smoker    Types: Cigarettes  . Smokeless tobacco: Never Used  . Alcohol Use: No  . Drug Use: No  . Sexual Activity: Not Currently   Other Topics Concern  . Not on file   Social History Narrative  . No narrative on file     Review of Systems: General: negative for chills, fever, night sweats  or weight changes.  Cardiovascular: negative for chest pain, dyspnea on exertion, edema, orthopnea, palpitations, paroxysmal nocturnal dyspnea or shortness of breath Dermatological: negative for rash Respiratory: negative for cough or wheezing Urologic: negative for hematuria Abdominal: negative for nausea, vomiting, diarrhea, bright red blood per rectum, melena, or hematemesis Neurologic: negative for visual changes, syncope, or dizziness All other systems reviewed and are otherwise negative except as noted above.    Blood pressure 102/56, pulse 84, height 5\' 1"  (1.549 m), weight 195 lb 14.4 oz (88.86 kg).  General appearance: alert and no distress Neck: no adenopathy, no  carotid bruit, no JVD, supple, symmetrical, trachea midline and thyroid not enlarged, symmetric, no tenderness/mass/nodules Lungs: clear to auscultation bilaterally Heart: regular rate and rhythm, S1, S2 normal, no murmur, click, rub or gallop Extremities: extremities normal, atraumatic, no cyanosis or edema  EKG not performed today  ASSESSMENT AND PLAN:   Peripheral arterial disease I saw [name] back 2 months ago. She was referred to me by Dr. Dellia Nimsiegel. She has dry gangrenous changes at the tip of her left great toe. She has two-vessel runoff her knees bilaterally. She is a diabetic on hemodialysis. At this point I do not see progression of heart gangrenous changes and preferred aggressive local wound care as opposed to invasive percutaneous options. Should her wound progress, and will become infected with risk of loss consider a more aggressive interventional approach.      Runell GessJonathan J. Oren Barella MD FACP,FACC,FAHA, FSCAI 12/27/2013 5:00 PM

## 2013-12-27 NOTE — Assessment & Plan Note (Signed)
I saw [name] back 2 months ago. She was referred to me by Dr. Dellia Nimsiegel. She has dry gangrenous changes at the tip of her left great toe. She has two-vessel runoff her knees bilaterally. She is a diabetic on hemodialysis. At this point I do not see progression of heart gangrenous changes and preferred aggressive local wound care as opposed to invasive percutaneous options. Should her wound progress, and will become infected with risk of loss consider a more aggressive interventional approach.

## 2013-12-31 ENCOUNTER — Ambulatory Visit: Payer: Medicare Other | Admitting: Cardiovascular Disease

## 2014-01-01 ENCOUNTER — Emergency Department (HOSPITAL_COMMUNITY)
Admission: EM | Admit: 2014-01-01 | Discharge: 2014-01-01 | Disposition: A | Discharge status: Home / Self Care | Payer: Medicare Other | Attending: Emergency Medicine | Admitting: Emergency Medicine

## 2014-01-01 ENCOUNTER — Emergency Department (HOSPITAL_COMMUNITY): Payer: Medicare Other

## 2014-01-01 ENCOUNTER — Encounter (HOSPITAL_COMMUNITY): Payer: Self-pay | Admitting: Emergency Medicine

## 2014-01-01 DIAGNOSIS — Z8619 Personal history of other infectious and parasitic diseases: Secondary | ICD-10-CM | POA: Diagnosis not present

## 2014-01-01 DIAGNOSIS — E1149 Type 2 diabetes mellitus with other diabetic neurological complication: Secondary | ICD-10-CM | POA: Insufficient documentation

## 2014-01-01 DIAGNOSIS — Z8673 Personal history of transient ischemic attack (TIA), and cerebral infarction without residual deficits: Secondary | ICD-10-CM | POA: Insufficient documentation

## 2014-01-01 DIAGNOSIS — Z8679 Personal history of other diseases of the circulatory system: Secondary | ICD-10-CM | POA: Diagnosis not present

## 2014-01-01 DIAGNOSIS — Z87891 Personal history of nicotine dependence: Secondary | ICD-10-CM | POA: Diagnosis not present

## 2014-01-01 DIAGNOSIS — E1142 Type 2 diabetes mellitus with diabetic polyneuropathy: Secondary | ICD-10-CM | POA: Insufficient documentation

## 2014-01-01 DIAGNOSIS — L089 Local infection of the skin and subcutaneous tissue, unspecified: Secondary | ICD-10-CM | POA: Insufficient documentation

## 2014-01-01 DIAGNOSIS — E119 Type 2 diabetes mellitus without complications: Secondary | ICD-10-CM | POA: Diagnosis not present

## 2014-01-01 DIAGNOSIS — M1990 Unspecified osteoarthritis, unspecified site: Secondary | ICD-10-CM | POA: Insufficient documentation

## 2014-01-01 DIAGNOSIS — Z792 Long term (current) use of antibiotics: Secondary | ICD-10-CM | POA: Insufficient documentation

## 2014-01-01 DIAGNOSIS — E213 Hyperparathyroidism, unspecified: Secondary | ICD-10-CM | POA: Insufficient documentation

## 2014-01-01 DIAGNOSIS — Z8614 Personal history of Methicillin resistant Staphylococcus aureus infection: Secondary | ICD-10-CM | POA: Insufficient documentation

## 2014-01-01 DIAGNOSIS — E669 Obesity, unspecified: Secondary | ICD-10-CM | POA: Insufficient documentation

## 2014-01-01 DIAGNOSIS — K219 Gastro-esophageal reflux disease without esophagitis: Secondary | ICD-10-CM | POA: Diagnosis not present

## 2014-01-01 DIAGNOSIS — Z862 Personal history of diseases of the blood and blood-forming organs and certain disorders involving the immune mechanism: Secondary | ICD-10-CM | POA: Diagnosis not present

## 2014-01-01 DIAGNOSIS — Z8659 Personal history of other mental and behavioral disorders: Secondary | ICD-10-CM | POA: Diagnosis not present

## 2014-01-01 DIAGNOSIS — Z7982 Long term (current) use of aspirin: Secondary | ICD-10-CM | POA: Insufficient documentation

## 2014-01-01 DIAGNOSIS — N189 Chronic kidney disease, unspecified: Secondary | ICD-10-CM | POA: Diagnosis not present

## 2014-01-01 DIAGNOSIS — Z79899 Other long term (current) drug therapy: Secondary | ICD-10-CM | POA: Insufficient documentation

## 2014-01-01 DIAGNOSIS — I96 Gangrene, not elsewhere classified: Secondary | ICD-10-CM | POA: Insufficient documentation

## 2014-01-01 LAB — CBC WITH DIFFERENTIAL/PLATELET
BASOS PCT: 0 % (ref 0–1)
Basophils Absolute: 0 10*3/uL (ref 0.0–0.1)
EOS ABS: 0.6 10*3/uL (ref 0.0–0.7)
Eosinophils Relative: 5 % (ref 0–5)
HCT: 27.4 % — ABNORMAL LOW (ref 36.0–46.0)
Hemoglobin: 8.1 g/dL — ABNORMAL LOW (ref 12.0–15.0)
Lymphocytes Relative: 10 % — ABNORMAL LOW (ref 12–46)
Lymphs Abs: 1.1 10*3/uL (ref 0.7–4.0)
MCH: 22.8 pg — AB (ref 26.0–34.0)
MCHC: 29.6 g/dL — AB (ref 30.0–36.0)
MCV: 77.2 fL — AB (ref 78.0–100.0)
Monocytes Absolute: 0.6 10*3/uL (ref 0.1–1.0)
Monocytes Relative: 6 % (ref 3–12)
Neutro Abs: 8 10*3/uL — ABNORMAL HIGH (ref 1.7–7.7)
Neutrophils Relative %: 79 % — ABNORMAL HIGH (ref 43–77)
PLATELETS: 382 10*3/uL (ref 150–400)
RBC: 3.55 MIL/uL — ABNORMAL LOW (ref 3.87–5.11)
RDW: 16 % — ABNORMAL HIGH (ref 11.5–15.5)
WBC: 10.3 10*3/uL (ref 4.0–10.5)

## 2014-01-01 LAB — COMPREHENSIVE METABOLIC PANEL
ALT: 10 U/L (ref 0–35)
ANION GAP: 9 (ref 5–15)
AST: 14 U/L (ref 0–37)
Albumin: 2.5 g/dL — ABNORMAL LOW (ref 3.5–5.2)
Alkaline Phosphatase: 65 U/L (ref 39–117)
BUN: 11 mg/dL (ref 6–23)
CO2: 33 mEq/L — ABNORMAL HIGH (ref 19–32)
Calcium: 8.6 mg/dL (ref 8.4–10.5)
Chloride: 100 mEq/L (ref 96–112)
Creatinine, Ser: 3.91 mg/dL — ABNORMAL HIGH (ref 0.50–1.10)
GFR calc Af Amer: 12 mL/min — ABNORMAL LOW (ref >90)
GFR calc non Af Amer: 10 mL/min — ABNORMAL LOW (ref >90)
Glucose, Bld: 188 mg/dL — ABNORMAL HIGH (ref 70–99)
Potassium: 4.2 mEq/L (ref 3.7–5.3)
SODIUM: 142 meq/L (ref 137–147)
TOTAL PROTEIN: 6.5 g/dL (ref 6.0–8.3)
Total Bilirubin: <0.2 mg/dL — ABNORMAL LOW (ref 0.3–1.2)

## 2014-01-01 LAB — I-STAT TROPONIN, ED: Troponin i, poc: 0.01 ng/mL (ref 0.00–0.08)

## 2014-01-01 MED ORDER — TRAMADOL HCL 50 MG PO TABS: 50.0000 mg | ORAL_TABLET | Freq: Four times a day (QID) | ORAL | Status: DC | PRN

## 2014-01-01 NOTE — ED Provider Notes (Signed)
CSN: [id number]     Arrival date & time 01/01/14  1806 History   First MD Initiated Contact with Patient 01/01/14 2041     Chief Complaint  Patient presents with  . Wound Infection     (Consider location/radiation/quality/duration/timing/severity/associated sxs/prior Treatment) HPI  [name] is a 80 y.o. female who presents for evaluation of left great toe pain that has been present for 5 weeks, and worsening. She and her family members have noticed some drainage from the area and the last several days. They have been using a dry dressing on it and not cleaning it at all. There's been no fever, chills, nausea, vomiting, weakness, or dizziness. She had her dialysis, today. She's not currently on an antibiotic. She completed her last course of antibiotics, about 10 days ago. She saw her vascular doctor, 5 days ago, and her podiatrist, 6 days ago. Her wound is being managed by observation. Her vascular disease is stable and there are no anticipated, or planned interventions. She's taking her medications, as prescribed. There are no other known modifying factors.   Past Medical History  Diagnosis Date  . Diabetes mellitus   . Anemia   . Hyperparathyroidism   . Depression   . Obesity   . Osteoarthritis   . MRSA (methicillin resistant staph aureus) culture positive   . Gout   . C. difficile diarrhea   . Chronic kidney disease     on Dialysis  . Peripheral arterial disease   . Diabetic neuropathy   . GERD (gastroesophageal reflux disease)   . Stroke    Past Surgical History  Procedure Laterality Date  . Breast biopsy Right   . Shuntogram Right 09/06/12    Thigh graft  . Sp declot avgg Right Sep 14, 2012    Right thigh graft   Family History  Problem Relation Age of Onset  . Cancer Father     type unknown  . Diabetes Sister   . Cancer Brother     type unknown   History  Substance Use Topics  . Smoking status: Former Smoker    Types: Cigarettes  . Smokeless tobacco:  Never Used  . Alcohol Use: No   OB History   Grav Para Term Preterm Abortions TAB SAB Ect Mult Living                 Review of Systems  All other systems reviewed and are negative.     Allergies  Codeine and Vancomycin  Home Medications   Prior to Admission medications   Medication Sig Start Date End Date Taking? Authorizing Provider  aspirin 325 MG tablet Take 1 tablet (325 mg total) by mouth daily. 12/16/13   Marinda Elk, MD  azithromycin [facility]) 250 MG tablet  10/10/13   Historical Provider, MD  ciprofloxacin (CIPRO) 250 MG tablet Take 1 tablet (250 mg total) by mouth 2 (two) times daily. 12/17/13   Marinda Elk, MD  doxercalciferol (HECTOROL) 4 MCG/2ML injection Inject 1.5 mLs (3 mcg total) into the vein Every Tuesday,Thursday,and Saturday with dialysis. 12/16/13   Marinda Elk, MD  doxycycline (VIBRAMYCIN) 100 MG capsule Take 1 capsule (100 mg total) by mouth 2 (two) times daily. 12/17/13   Marinda Elk, MD  gabapentin (NEURONTIN) 100 MG capsule Take 100 mg by mouth 2 (two) times daily.  10/10/13   Historical Provider, MD  lanthanum (FOSRENOL) 1000 MG chewable tablet Chew 1,000-2,000 mg by mouth 3 (three) times daily with meals. Take  2000 mg by mouth 3 times daily with meals and take  by mouth with snacks.    Historical Provider, MD  metroNIDAZOLE (FLAGYL) 250 MG tablet  12/05/13   Historical Provider, MD  omeprazole (PRILOSEC) 20 MG capsule Take 1 capsule (20 mg total) by mouth daily. 11/28/13   Louis Meckel, MD  SENSIPAR 30 MG tablet Take 30 mg by mouth every evening.  04/13/13   Historical Provider, MD   BP 127/58  Temp(Src) 98.4 F (36.9 C)  Resp 20  Ht  (1.549 m)  Wt 198 lb (89.812 kg)  BMI 37.43 kg/m2  SpO2 95% Physical Exam  Nursing note and vitals reviewed. Constitutional: She is oriented to person, place, and time. She appears well-developed.  Elderly, frail  HENT:  Head: Normocephalic and atraumatic.  Eyes: Conjunctivae  and EOM are normal. Pupils are equal, round, and reactive to light.  Neck: Normal range of motion and phonation normal. Neck supple.  Cardiovascular: Normal rate, regular rhythm and intact distal pulses.   Pulmonary/Chest: Effort normal and breath sounds normal. She exhibits no tenderness.  Abdominal: Soft. She exhibits no distension. There is no tenderness. There is no guarding.  Musculoskeletal: Normal range of motion.  Left great toe, has a nearly, completely elevated nail, it is attached by a 3 mm piece of cuticle. Beneath the nail, is a new nail, and very minimal redness and wetness of the nailbed. There is no fluctuance Frank discharge or significant redness. The toe appears ischemic, and there is an ulcer of the pad. There is no proximal foot, tenderness or swelling and no palpable popliteal adenopathy.  Neurological: She is alert and oriented to person, place, and time. She exhibits normal muscle tone.  Skin: Skin is warm and dry.  Psychiatric: She has a normal mood and affect. Her behavior is normal.    ED Course  Procedures (including critical care time) Medications - No data to display  Patient Vitals for the past 24 hrs:  BP Temp Resp SpO2 Height Weight  01/01/14 1813 127/58 mmHg 98.4 F (36.9 C) 20 95 %  (1.549 m) 198 lb (89.812 kg)   Procedure: Removal of toenail [ to prevent pressure on toe and avoid accidental avulsion]. Sharp dissection with scissor, to cut nail near attachment at cuticle. No pain or bleeding with maneuver.  Photo below taken after removal of nail.      10:04 PM Reevaluation with update and discussion. After initial assessment and treatment, an updated evaluation reveals no toe pain now. Findings discussed with patient and family members, all questions answered [name] L   Labs Review Labs Reviewed  CBC WITH DIFFERENTIAL - Abnormal; Notable for the following:    RBC 3.55 (*)    Hemoglobin 8.1 (*)    HCT 27.4 (*)    MCV 77.2 (*)    MCH  22.8 (*)    MCHC 29.6 (*)    RDW 16.0 (*)    Neutrophils Relative % 79 (*)    Neutro Abs 8.0 (*)    Lymphocytes Relative 10 (*)    All other components within normal limits  COMPREHENSIVE METABOLIC PANEL - Abnormal; Notable for the following:    CO2 33 (*)    Glucose, Bld 188 (*)    Creatinine, Ser 3.91 (*)    Albumin 2.5 (*)    Total Bilirubin <0.2 (*)    GFR calc non Af Amer 10 (*)    GFR calc Af Amer 12 (*)  All other components within normal limits  I-STAT CG4 LACTIC ACID, ED  I-STAT TROPOININ, ED    Imaging Review Dg Foot Complete Left  01/01/2014   CLINICAL DATA:  wound infection at left great toe.  Diabetes.  EXAM: LEFT FOOT - COMPLETE 3+ VIEW  COMPARISON:  12/26/2013, report not available  FINDINGS: Osteopenia. Vascular calcifications. Osseous irregularity at the base of the proximal phalanges of the second and third digits. Soft tissue ulcer about the medial aspect of the great toe. This may extend to the level of the tuft of the great toe. No gross osseous destruction. No radiopaque foreign object.  IMPRESSION: Soft tissue ulcer about the great toe. Given its apparent extension to the level bone, osteomyelitis is a concern. Osteomyelitis was confirmed on 12/17/2013 MRI.  Osseous irregularity at the base of the second and third metatarsals. Suspicious for intra-articular fractures. Present on 12/26/13.   Electronically Signed   By: Jeronimo Greaves M.D.   On: 01/01/2014 19:38     EKG Interpretation None      MDM   Final diagnoses:  Dry gangrene    Nonspecific toe pain in patient with dry gangrene of her left great toe. Her toenail was removed to facilitate comfort and prevent damage from a possible avulsion of the remainder of the nail attachment. Today, there is no evidence for infection of the left great toe or foot. Imaging is reassuring for lack of osteo-myelitis. No evidence for metabolic instability, or serious bacterial infection.  Nursing Notes Reviewed/ Care  Coordinated Applicable Imaging Reviewed Interpretation of Laboratory Data incorporated into ED treatment  The patient appears reasonably screened and/or stabilized for discharge and I doubt any other medical condition or other [facility] requiring further screening, evaluation, or treatment in the ED at this time prior to discharge.  Plan: Home Medications- Tramadol; Home Treatments- rest; return here if the recommended treatment, does not improve the symptoms; Recommended follow up- Podiatry tomorrow as scheduled.    Flint Melter, MD 01/02/14 [facility] Manners

## 2014-01-01 NOTE — ED Notes (Signed)
Pt in with family c/o new drainage from wound on left foot on her great toe, states the wound has been there for awhile and she has been followed at the wound clinic but it doesn't normally have drainage, increased pain to area that cannot be controlled at home, denies fever, no redness or swelling noted to leg

## 2014-01-01 NOTE — Discharge Instructions (Signed)
Keep a light bandage on the left great toe.  Use Benadryl, at nighttime if needed to help his sleep.  Followup with your podiatrist, tomorrow, as scheduled. Ask him about scheduling your toe, amputation.

## 2014-01-02 ENCOUNTER — Encounter: Payer: Self-pay | Admitting: Podiatry

## 2014-01-02 ENCOUNTER — Ambulatory Visit (INDEPENDENT_AMBULATORY_CARE_PROVIDER_SITE_OTHER): Payer: Medicare Other | Admitting: Podiatry

## 2014-01-02 VITALS — BP 138/64 | HR 64 | Resp 12

## 2014-01-02 DIAGNOSIS — E1159 Type 2 diabetes mellitus with other circulatory complications: Secondary | ICD-10-CM | POA: Diagnosis not present

## 2014-01-02 DIAGNOSIS — I739 Peripheral vascular disease, unspecified: Secondary | ICD-10-CM

## 2014-01-02 DIAGNOSIS — I96 Gangrene, not elsewhere classified: Secondary | ICD-10-CM | POA: Diagnosis not present

## 2014-01-02 DIAGNOSIS — L84 Corns and callosities: Secondary | ICD-10-CM

## 2014-01-02 DIAGNOSIS — I6320 Cerebral infarction due to unspecified occlusion or stenosis of unspecified precerebral arteries: Secondary | ICD-10-CM | POA: Diagnosis not present

## 2014-01-02 DIAGNOSIS — B351 Tinea unguium: Secondary | ICD-10-CM

## 2014-01-02 DIAGNOSIS — M79609 Pain in unspecified limb: Secondary | ICD-10-CM

## 2014-01-02 DIAGNOSIS — M79606 Pain in leg, unspecified: Secondary | ICD-10-CM

## 2014-01-02 DIAGNOSIS — E1152 Type 2 diabetes mellitus with diabetic peripheral angiopathy with gangrene: Secondary | ICD-10-CM

## 2014-01-02 MED ORDER — SILVER SULFADIAZINE 1 % EX CREA: 1.0000 "application " | TOPICAL_CREAM | Freq: Every day | CUTANEOUS | Status: DC

## 2014-01-02 NOTE — Patient Instructions (Signed)
Monitor for any signs/symptoms of infection. Call the office immediately if any occur or go directly to the emergency room. Call with any questions/concerns.  

## 2014-01-03 ENCOUNTER — Telehealth: Payer: Self-pay | Admitting: Podiatry

## 2014-01-03 ENCOUNTER — Telehealth: Payer: Self-pay | Admitting: *Deleted

## 2014-01-03 DIAGNOSIS — L97509 Non-pressure chronic ulcer of other part of unspecified foot with unspecified severity: Secondary | ICD-10-CM

## 2014-01-03 NOTE — Telephone Encounter (Signed)
[name] came in to see about the referral that Dr. Ardelle Anton was having her go to for neurologist (Dr. Roda Shutters).  They went over and the office told them they needed a referral to make an appt. They came in to check on it. Please let patient know when the referral is complete so they can make the appt. Thanks

## 2014-01-03 NOTE — Progress Notes (Addendum)
Patient ID: [name], female   DOB: [birth year], 80 y.o.   MRN: [id number]   Subjective:Patient presents the office with her niece with complaints of continued wound on the left hallux as well as pain to the right hallux nail. Also states that she has multiple areas on her feet which are peeling. She's continued with antibiotics. She did go to the emergency room on Tuesday due to worsening pain in the leg and foot. At that time that nail was removed on the left hallux. She denies any systemic complaints such as fevers, chills, nausea, vomiting. She is tolerating the antibiotics at this time. She is to continue with daily dressing changes. No other complaints.   ROS-negative except for what is stated above  Objective: DP PT pulses decreased bilaterally. Left hallux granular wound the distal aspect of the digit with small amount of exposed exposed distal phalanx. The remaining hallux has gangrenous changes. There is no fluctuance, crepitus, drainage, erythema. Right hallux nail hypertrophic, dystrophic, painful. Dry skin plantar aspect bilateral feet. Right submetatarsal one area of hyperkeratotic tissue which is healed back with a small underlying superficial abrasion without clinical signs of infection. Right submetatarsal 5 thick hyperkeratotic tissue. No further lesions identified. Decreased protective sensation. No calf pain, swelling, or warmth.   Assessment: 80 year old female left hallux osteomyelitis, dry gangrene; PVD  Plan: Continue with aggressive wound care. Silvadene prescribed to applied to the wound. Continue daily dressing changes. Prescription given for rolling walker per patient's request. Patient and her family are requesting a second opinion to evaluate blood flow so therefore a referral for vascular surgery will be given. Will also consider wound care referral. Serial x-rays to monitor osteomyelitis. Hyperkeratotic lesions sharply debrided without complications. Right hallux nail  sharply debrided without complication. Discussed with the patient and family in detail that if any symptoms are to worsen or any clinical signs or symptoms of infection she is to go directly to the emergency room or call the office. Call the questions or concerns. Followup in one week  Discussed in detail with the patient/niece the significance of the osteomyelitis. At this time, cannot proceed with hallux amputation as there is not adequate circulation to heel the amputation. Unless there is a chance she can be re-vascularized, we will continue with local wound care. Again, we discussed the possibility of amputation.   Patient was also previously unaware to followup with neurology since her discharge from the hospital so therefore she was provided the name and number and location to followup with a neurologist.

## 2014-01-03 NOTE — Telephone Encounter (Signed)
Referral sent to [facility] for consultation with Dr. Roda Shutters upon patient's request.  I called and left the patient a message that the referral was sent.  They should give you a call to schedule.

## 2014-01-04 ENCOUNTER — Encounter: Payer: Self-pay | Admitting: Vascular Surgery

## 2014-01-07 DIAGNOSIS — N186 End stage renal disease: Secondary | ICD-10-CM | POA: Diagnosis not present

## 2014-01-08 DIAGNOSIS — E1129 Type 2 diabetes mellitus with other diabetic kidney complication: Secondary | ICD-10-CM | POA: Diagnosis not present

## 2014-01-08 DIAGNOSIS — N2581 Secondary hyperparathyroidism of renal origin: Secondary | ICD-10-CM | POA: Diagnosis not present

## 2014-01-08 DIAGNOSIS — N186 End stage renal disease: Secondary | ICD-10-CM | POA: Diagnosis not present

## 2014-01-08 DIAGNOSIS — D631 Anemia in chronic kidney disease: Secondary | ICD-10-CM | POA: Diagnosis not present

## 2014-01-08 DIAGNOSIS — N039 Chronic nephritic syndrome with unspecified morphologic changes: Secondary | ICD-10-CM | POA: Diagnosis not present

## 2014-01-08 NOTE — Telephone Encounter (Signed)
Since this whole in basket thing confuses me at the moment, did you get this message?

## 2014-01-09 ENCOUNTER — Ambulatory Visit (INDEPENDENT_AMBULATORY_CARE_PROVIDER_SITE_OTHER): Payer: Medicare Other | Admitting: Podiatry

## 2014-01-09 ENCOUNTER — Encounter: Payer: Self-pay | Admitting: Vascular Surgery

## 2014-01-09 ENCOUNTER — Ambulatory Visit: Payer: Medicare Other | Admitting: Podiatry

## 2014-01-09 ENCOUNTER — Encounter: Payer: Self-pay | Admitting: Podiatry

## 2014-01-09 VITALS — BP 146/60 | HR 96 | Temp 98.1°F | Resp 12

## 2014-01-09 DIAGNOSIS — I96 Gangrene, not elsewhere classified: Secondary | ICD-10-CM

## 2014-01-09 DIAGNOSIS — M79609 Pain in unspecified limb: Secondary | ICD-10-CM | POA: Diagnosis not present

## 2014-01-09 DIAGNOSIS — I739 Peripheral vascular disease, unspecified: Secondary | ICD-10-CM | POA: Diagnosis not present

## 2014-01-09 DIAGNOSIS — E1159 Type 2 diabetes mellitus with other circulatory complications: Secondary | ICD-10-CM | POA: Diagnosis not present

## 2014-01-09 DIAGNOSIS — I6320 Cerebral infarction due to unspecified occlusion or stenosis of unspecified precerebral arteries: Secondary | ICD-10-CM

## 2014-01-09 DIAGNOSIS — E1152 Type 2 diabetes mellitus with diabetic peripheral angiopathy with gangrene: Secondary | ICD-10-CM

## 2014-01-09 DIAGNOSIS — M79606 Pain in leg, unspecified: Secondary | ICD-10-CM

## 2014-01-09 NOTE — Progress Notes (Signed)
Patient ID: [name], female   DOB: 1932/10/24, 80 y.o.   MRN: [id number]  Ms. Vancott returns the office today for follow violation of left hallux gangrene and osteomyelitis. Per the patient and her niece who companies her today she did not notice any change in the test his last week. They continue antibiotics and daily dressing changes. Also states that she has a painful lesion on the plantar aspect of her left foot. Denies any systemic complaints as fevers, chills, nausea, vomiting. No other complaints at this time.  Objective: AAO x3, NAD DP/PT pulses nonpalpable b/l. CRT delayed. Decreased protective sensation with Dorann Ou monofilament, decreased vibratory sensation. Hallux with gangrenous changes to the digit and the distal aspect with ulceration with exposed distal phalanx. Small amount of fibrotic tissue within the wound. No fluctuance, crepitus, ascending cellulitis. Submetatarsal 4 hyperkeratotic thickened lesion. No surrounding erythema of other clinical signs of infection. Multiple areas of dry skin bilateral plantar feet, appear to be better than last week. No further open ulcerations of this time.  Assessment: 80 year old female with significant peripheral arterial disease, gangrenous changes and osteomyelitis of the left hallux.  Plan: -Area treatment options were discussed the patient and her niece at great length including all alternatives, risks, complications. -At this time Dr. Gery Pray recommend aggressive wound care. Patient would like a second opinion therefore she'll be seen by [facility] tomorrow.  -Will await [facility] recommendation -If no chance of revascularization/stenting, will refer to the wound care center. -Continue with silvadene cream/daily dressing changes for now.  -Again discussed the likelihood of amputation.  -Hyperkeratotic lesion left plantar foot sharply debrided without complications to patient comfort. -Monitor for any signs/symptoms of infection. If any are  to occur she is to go directly to the emergency room or call the office.

## 2014-01-09 NOTE — Patient Instructions (Signed)
Continue with daily dressing changes. Follow up with vascular surgery and neurology Monitor for any signs/symptoms of infection. Call the office immediately if any occur or go directly to the emergency room. Call with any questions/concerns.

## 2014-01-10 ENCOUNTER — Telehealth: Payer: Self-pay | Admitting: *Deleted

## 2014-01-10 ENCOUNTER — Ambulatory Visit (INDEPENDENT_AMBULATORY_CARE_PROVIDER_SITE_OTHER): Payer: Medicare Other | Admitting: Vascular Surgery

## 2014-01-10 VITALS — BP 150/76 | HR 80 | Temp 98.3°F | Resp 18 | Ht 63.0 in | Wt 212.0 lb

## 2014-01-10 DIAGNOSIS — L98499 Non-pressure chronic ulcer of skin of other sites with unspecified severity: Secondary | ICD-10-CM | POA: Diagnosis not present

## 2014-01-10 DIAGNOSIS — I7025 Atherosclerosis of native arteries of other extremities with ulceration: Secondary | ICD-10-CM | POA: Insufficient documentation

## 2014-01-10 DIAGNOSIS — I739 Peripheral vascular disease, unspecified: Secondary | ICD-10-CM

## 2014-01-10 NOTE — Telephone Encounter (Signed)
[facility]. Ardelle Anton.

## 2014-01-10 NOTE — Progress Notes (Signed)
HISTORY AND PHYSICAL     CC:  Non healing wound left great toe  Jamie Reeve, MD  HPI: This is a 80 y.o. female who presents today with a non healing wound left great toe.  She states that this has been going on for a while.  She does have a hx of diabetes and ESRD on hemodialysis.    She had ABI's performed at Dr. Hazle Coca office in June, which revealed ABI on the right of 1.1 and the left 0.75.  These were slightly elevated due to calcifications of the arteries.  Her TBI's were 0.32 on the right and 0.27 on the left.  She also had an echo, which revealed normal LV function.  She has a remote tobacco hx.  Past Medical History  Diagnosis Date  . Diabetes mellitus   . Anemia   . Hyperparathyroidism   . Depression   . Obesity   . Osteoarthritis   . MRSA (methicillin resistant staph aureus) culture positive   . Gout   . C. difficile diarrhea   . Chronic kidney disease     on Dialysis  . Diabetic neuropathy   . GERD (gastroesophageal reflux disease)   . Stroke   . Peripheral arterial disease     Gangrene-Left Great Toe   Past Surgical History  Procedure Laterality Date  . Breast biopsy Right   . Shuntogram Right 09/06/12    Thigh graft  . Sp declot avgg Right Sep 14, 2012    Right thigh graft    Allergies  Allergen Reactions  . Codeine Hives  . Vancomycin Rash    Current Outpatient Prescriptions  Medication Sig Dispense Refill  . acetaminophen (TYLENOL) 500 MG tablet Take 500-1,000 mg by mouth every 6 (six) hours as needed.      Marland Kitchen aspirin 325 MG EC tablet Take 325 mg by mouth daily.      . ciprofloxacin (CIPRO) 250 MG tablet Take 1 tablet (250 mg total) by mouth 2 (two) times daily.  30 tablet  0  . doxycycline (VIBRAMYCIN) 100 MG capsule Take 1 capsule (100 mg total) by mouth 2 (two) times daily.  30 capsule  0  . gabapentin (NEURONTIN) 100 MG capsule Take 100 mg by mouth 2 (two) times daily.       Marland Kitchen lanthanum (FOSRENOL) 1000 MG chewable tablet Chew 1,000-2,000  mg by mouth 3 (three) times daily with meals. Take 2000 mg by mouth 3 times daily with meals and take  by mouth with snacks.      Marland Kitchen omeprazole (PRILOSEC) 20 MG capsule Take 1 capsule (20 mg total) by mouth daily.  90 capsule  3  . SENSIPAR 30 MG tablet Take 30 mg by mouth every evening.       . silver sulfADIAZINE (SILVADENE) 1 % cream Apply 1 application topically daily.  50 g  0  . traMADol (ULTRAM) 50 MG tablet Take 1 tablet (50 mg total) by mouth every 6 (six) hours as needed.  30 tablet  0   No current facility-administered medications for this visit.    Family History  Problem Relation Age of Onset  . Cancer Father     type unknown  . Diabetes Sister   . Cancer Brother     type unknown    History   Social History  . Marital Status: Divorced    Spouse Name: N/A    Number of Children: 0  . Years of Education: N/A   Occupational History  .  retired    Social History Main Topics  . Smoking status: Former Smoker    Types: Cigarettes  . Smokeless tobacco: Never Used  . Alcohol Use: No  . Drug Use: No  . Sexual Activity: Not Currently   Other Topics Concern  . Not on file   Social History Narrative  . No narrative on file     ROS:  Positive    Negative    All sytems reviewed and are negative  Cardiovascular:  chest pain/pressure  palpitations  SOB lying flat  DOE  pain in legs while walking  pain in feet when lying flat  hx of DVT  hx of phlebitis  swelling in legs  varicose veins  Pulmonary:  productive cough  asthma  wheezing  Neurologic:  weakness in  arms  legs  numbness in  arms  legs difficulty speaking or slurred speech  temporary loss of vision in one eye  dizziness  Hematologic:  bleeding problems  problems with blood clotting easily  GI  vomiting blood  blood in stool  GU:  burning with urination  blood in urine  Psychiatric:  hx of major  depression  Integumentary:  rashes  ulcers-non healing ulcer left great toe  Constitutional:  fever  chills   PHYSICAL EXAMINATION:  Filed Vitals:   01/10/14 1603  Height:  (1.6 m)  Weight: 212 lb (96.163 kg)    General:  WDWN in NAD Gait: Not observed HENT: WNL, normocephalic Pulmonary: normal non-labored breathing  Abdomen: obese Skin: without rashes, with ulcers  Vascular Exam/Pulses: Pt has left 1st toe ulcer with exposed bone.  The right 5th toe has a small ulcer present.  She does not have palpable popliteal or pedal pulses.  Extremities: without ischemic changes, without Gangrene , without cellulitis; with open wounds-see vascular exam;  Musculoskeletal: no muscle wasting or atrophy  Neurologic: A&O X 3; Appropriate Affect ; SENSATION: normal; MOTOR FUNCTION:  moving all extremities equally. Speech is fluent/normal   Non-Invasive Vascular Imaging:   ABI's from outside office on 10/17/13: Right:  1.1 Left:  0.75  Duplex of lower extremities at outside office 10/17/13: Right EIA:  >50% diameter reduction by velocity Bilateral runoff:  Calf vessels appear severely diseased bilaterally.  The anterior and posterior tibial arteries demonstrated occlusive disease with collateralization noted distally.  Bilateral peroneal arteries were not visualized which may be due to occlusive disease.   Pt meds includes: Statin:  No. Beta Blocker:  No. Aspirin:  Yes.   ACEI:  No. ARB:  No. Other Antiplatelet/Anticoagulant:  No.   ASSESSMENT/PLAN:: 80 y.o. 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    -Dr. Darrick Penna recommended a left 1st toe amputation to the pt.  He gives her a 50/50 chance of healing an amputation wound and if this did not heal, she would need a more proximal amputation. -pt has a referral to the wound care center and would like to try more conservative  methods initially.  Pt and family aware that we would need to proceed with amputation if she develops infection. -also recommend to wash feet with mild soap and water daily, make sure shoes fit appropriately, and she wears socks with her shoes.  She needs meticulous foot care.     Doreatha Massed, PA-C [facility] [phone]  Clinic MD:  Pt seen and examined in conjunction with Dr.  Fields  History and exam details as above. The patient has no palpable popliteal or pedal pulses bilaterally. Recent arterial duplex scan showed significant tibial artery occlusive disease. I have recommended a left first toe amputation to the patient today. However, she may not have enough arterial circulation to heal this and could eventually end up with a left below or above-knee amputation. She is not a candidate for tibial bypass due to her overall debility.  At this point the patient and the family wished to have an evaluation at the wound center to see if they can get the toe to heal. I discussed with the patient that they are at risk of developing ascending infection. However, there is currently no sign of this and have told them that obtaining a second opinion at the wound center would be fine. The patient will return on as-needed basis if she wishes to have a left first toe amputation.  Fabienne Bruns, MD [facility] Office: [phone] Pager: [phone]

## 2014-01-10 NOTE — Telephone Encounter (Signed)
Appointment scheduled for 01/16/2014 at 10am with Dr. Roda Shutters.  Patient is aware.

## 2014-01-10 NOTE — Telephone Encounter (Signed)
Message copied by Enedina Finner on Thu Jan 10, 2014  8:58 AM ------      Message from: Vivi Barrack      Created: Sun Jan 06, 2014  4:43 PM      Regarding: Wound Care Referral        Can we get this patient a referral to wound care? Thanks.  ------

## 2014-01-14 DIAGNOSIS — M86179 Other acute osteomyelitis, unspecified ankle and foot: Secondary | ICD-10-CM | POA: Diagnosis not present

## 2014-01-14 DIAGNOSIS — N186 End stage renal disease: Secondary | ICD-10-CM | POA: Diagnosis not present

## 2014-01-14 DIAGNOSIS — E7800 Pure hypercholesterolemia, unspecified: Secondary | ICD-10-CM | POA: Diagnosis not present

## 2014-01-14 DIAGNOSIS — A0472 Enterocolitis due to Clostridium difficile, not specified as recurrent: Secondary | ICD-10-CM | POA: Diagnosis not present

## 2014-01-16 ENCOUNTER — Ambulatory Visit (INDEPENDENT_AMBULATORY_CARE_PROVIDER_SITE_OTHER): Payer: Medicare Other | Admitting: Neurology

## 2014-01-16 ENCOUNTER — Encounter: Payer: Self-pay | Admitting: Neurology

## 2014-01-16 ENCOUNTER — Encounter (HOSPITAL_BASED_OUTPATIENT_CLINIC_OR_DEPARTMENT_OTHER): Payer: Medicare Other | Attending: General Surgery

## 2014-01-16 VITALS — BP 147/69 | HR 99 | Ht 63.0 in | Wt 199.0 lb

## 2014-01-16 DIAGNOSIS — M9080 Osteopathy in diseases classified elsewhere, unspecified site: Secondary | ICD-10-CM | POA: Diagnosis not present

## 2014-01-16 DIAGNOSIS — L97509 Non-pressure chronic ulcer of other part of unspecified foot with unspecified severity: Secondary | ICD-10-CM | POA: Insufficient documentation

## 2014-01-16 DIAGNOSIS — Z992 Dependence on renal dialysis: Secondary | ICD-10-CM | POA: Diagnosis not present

## 2014-01-16 DIAGNOSIS — E1159 Type 2 diabetes mellitus with other circulatory complications: Secondary | ICD-10-CM | POA: Diagnosis not present

## 2014-01-16 DIAGNOSIS — E1169 Type 2 diabetes mellitus with other specified complication: Secondary | ICD-10-CM | POA: Insufficient documentation

## 2014-01-16 DIAGNOSIS — I6350 Cerebral infarction due to unspecified occlusion or stenosis of unspecified cerebral artery: Secondary | ICD-10-CM

## 2014-01-16 DIAGNOSIS — I739 Peripheral vascular disease, unspecified: Secondary | ICD-10-CM

## 2014-01-16 DIAGNOSIS — M869 Osteomyelitis, unspecified: Secondary | ICD-10-CM | POA: Insufficient documentation

## 2014-01-16 DIAGNOSIS — N186 End stage renal disease: Secondary | ICD-10-CM

## 2014-01-16 DIAGNOSIS — I6359 Cerebral infarction due to unspecified occlusion or stenosis of other cerebral artery: Secondary | ICD-10-CM

## 2014-01-16 DIAGNOSIS — L98499 Non-pressure chronic ulcer of skin of other sites with unspecified severity: Secondary | ICD-10-CM

## 2014-01-16 LAB — GLUCOSE, CAPILLARY: GLUCOSE-CAPILLARY: 216 mg/dL — AB (ref 70–99)

## 2014-01-16 NOTE — Patient Instructions (Signed)
-   continue ASA for stroke prevention - follow up with PCP for stroke risk factor modification - continue to follow up with wound care, podiatry and vascular surgery for left toe osteomyelitis - from stroke standpoint, she does not have large vessel stenosis or occlusion, she should not precluded from future peripheral vascular procedures due to her lacunar strokes. But recommend to avoid hypotension during anesthesia and surgical procedures. - recommend to monitor BP and glucose at home. - follow up in 3 months.

## 2014-01-16 NOTE — Progress Notes (Signed)
STROKE NEUROLOGY FOLLOW UP NOTE  NAME: AREEBA SULSER DOB: 1932-11-21  REASON FOR VISIT: stroke follow up HISTORY FROM: chart and niece  Today we had the pleasure of seeing ILAYDA TODA in follow-up at our Neurology Clinic. Pt was accompanied by niece.   History Summary 80 y.o. female with history of DM 2 with renal complications and peripheral neuropathy, ESRD on TTS hemodialysis, prior stroke (patient/family unaware that she had a stroke) with residual mild right-sided weakness, secondary hyperparathyroidism, gout, PAD with left toe wound, was admitted on 12/14/13 due to worsening right sided weakness as well as unsteady gait with right facial numbness. MRI showed acute left thalamus lacunar stroke and also remote b/l thalamic stroke with right cerebellar stroke, consistent with small vessel disease. She was discharged with ASA . Her LDL 44 so no statin needed. A1C 5.8 indicate DM under control without meds (likely due to ESRD).  During hospitalization, her left first toe wound was evaluated and found to have osteomyelitis. She was put on cipro and doxy, and followed up with podiatry and vascular surgery, recommended first toe amputation but with possible unhealing wound which need further amputation higher up. She is going to have wound care consult this pm for second opinion.   Interval History During the interval time, the patient has been doing well from stroke standpoint. Her weakness on the right almost back to baseline but she still has intermittent right facial numbness and right leg numbness. If she walks long distance, still has some weakness on the right side.   She has intermittent pain from left first toe and currently on tramadol for pain control. She has not been on Abx for the last two weeks. She is going to have wound care consult this pm for second opinion.  REVIEW OF SYSTEMS: Full 14 system review of systems performed and notable only for those listed below and in HPI  above, all others are negative:  Constitutional: N/A  Cardiovascular: N/A  Ear/Nose/Throat: N/A  Skin: N/A  Eyes: N/A  Respiratory: N/A  Gastroitestinal: N/A  Genitourinary: N/A Hematology/Lymphatic: N/A  Endocrine: N/A  Musculoskeletal: N/A  Allergy/Immunology: N/A  Neurological: N/A  Psychiatric: N/A  The following represents the patient's updated allergies and side effects list: Allergies  Allergen Reactions  . Codeine Hives  . Vancomycin Rash    Labs since last visit of relevance include the following: Results for orders placed during the hospital encounter of 01/01/14  CBC WITH DIFFERENTIAL      Result Value Ref Range   WBC 10.3  4.0 - 10.5 K/uL   RBC 3.55 (*) 3.87 - 5.11 MIL/uL   Hemoglobin 8.1 (*) 12.0 - 15.0 g/dL   HCT 21.3 (*) 08.6 - 57.8 %   MCV 77.2 (*) 78.0 - 100.0 fL   MCH 22.8 (*) 26.0 - 34.0 pg   MCHC 29.6 (*) 30.0 - 36.0 g/dL   RDW 46.9 (*) 62.9 - 52.8 %   Platelets 382  150 - 400 K/uL   Neutrophils Relative % 79 (*) 43 - 77 %   Neutro Abs 8.0 (*) 1.7 - 7.7 K/uL   Lymphocytes Relative 10 (*) 12 - 46 %   Lymphs Abs 1.1  0.7 - 4.0 K/uL   Monocytes Relative 6  3 - 12 %   Monocytes Absolute 0.6  0.1 - 1.0 K/uL   Eosinophils Relative 5  0 - 5 %   Eosinophils Absolute 0.6  0.0 - 0.7 K/uL   Basophils Relative  0  0 - 1 %   Basophils Absolute 0.0  0.0 - 0.1 K/uL  COMPREHENSIVE METABOLIC PANEL      Result Value Ref Range   Sodium 142  137 - 147 mEq/L   Potassium 4.2  3.7 - 5.3 mEq/L   Chloride 100  96 - 112 mEq/L   CO2 33 (*) 19 - 32 mEq/L   Glucose, Bld 188 (*) 70 - 99 mg/dL   BUN 11  6 - 23 mg/dL   Creatinine, Ser 1.61 (*) 0.50 - 1.10 mg/dL   Calcium 8.6  8.4 - 09.6 mg/dL   Total Protein 6.5  6.0 - 8.3 g/dL   Albumin 2.5 (*) 3.5 - 5.2 g/dL   AST 14  0 - 37 U/L   ALT 10  0 - 35 U/L   Alkaline Phosphatase 65  39 - 117 U/L   Total Bilirubin <0.2 (*) 0.3 - 1.2 mg/dL   GFR calc non Af Amer 10 (*) >90 mL/min   GFR calc Af Amer 12 (*) >90 mL/min    Anion gap 9  5 - 15  I-STAT TROPOININ, ED      Result Value Ref Range   Troponin i, poc 0.01  0.00 - 0.08 ng/mL   Comment 3             The neurologically relevant items on the patient's problem list were reviewed on today's visit.  Neurologic Examination  A problem focused neurological exam (12 or more points of the single system neurologic examination, vital signs counts as 1 point, cranial nerves count for 8 points) was performed.  Blood pressure 147/69, pulse 99, height  (1.6 m), weight 199 lb (90.266 kg).  General - Well nourished, well developed, in no apparent distress.  Ophthalmologic - not able to see through.  Cardiovascular - Regular rate and rhythm with no murmur.  Extremities - left LE with dressing and wound in the first toe  Mental Status -  Level of arousal and orientation to time, place, and person were intact. Language including expression, naming, repetition, comprehension was assessed and found intact.  Cranial Nerves II - XII - II - Visual field intact OU. III, IV, VI - Extraocular movements intact. V - Facial sensation intact bilaterally. VII - Facial movement intact bilaterally. VIII - Hearing & vestibular intact bilaterally. X - Palate elevates symmetrically. XI - Chin turning & shoulder shrug intact bilaterally. XII - Tongue protrusion intact.  Motor Strength - The patient's strength was normal in all extremities and pronator drift was absent.  Bulk was normal and fasciculations were absent.   Motor Tone - Muscle tone was assessed at the neck and appendages and was normal.  Reflexes - The patient's reflexes were diminished in all extremities and she had no pathological reflexes.  Sensory - Light touch, temperature/pinprick were assessed and were symmetrical except RLE decreased light touch and pinprick.    Coordination - The patient had normal movements in the hands and feet with no ataxia or dysmetria.  Tremor was absent.  Gait and Station  - wide based gait with en bloc turns, but steady.  Data reviewed: I personally reviewed the images and agree with the radiology interpretations.  Ct Head Wo Contrast  12/14/2013  1. Diffuse cerebral cerebellar atrophy. White matter changes consistent with chronic ischemia. No acute intracranial abnormality. 2. Subtle lucencies noted throughout the skull. Subtle changes of metastatic disease, myeloma, or chronic anemia cannot be excluded. Clinical correlation suggested.  MRI /  Mra Head Wo Contrast  12/14/2013  1. Acute 4.5 mm nonhemorrhagic infarct within the lateral left thalamus were posterior limb internal capsule.  2. Extensive periventricular and subcortical white matter change bilaterally is likely related to microvascular ischemic change.  3. Remote lacunar infarcts of the thalami bilaterally.  4. Remote infarcts of the right cerebellum and brachium pontis.  5. The MRA demonstrates mild atherosclerotic changes within the cavernous carotid arteries and distal right vertebral artery without focal stenosis.  6. Moderate medium and small vessel disease in both the anterior and posterior circulation.  Dg Foot Complete Left  12/14/2013  Equivocal findings suggesting the possibility of osteomyelitis of the first distal phalanx in the setting of air in the surrounding soft tissues. Consider further evaluation with MRI.  MRI left foot 12/17/13 - Osteomyelitis of the terminal phalanx of the great toe. No abscess. Carotid Doppler - Bilateral: 1-39% ICA stenosis. Vertebral artery flow is antegrade.  2D Echocardiogram - ejection fraction 55-60%. Apparent small PFO by color Doppler.  CXR No significant acute findings.  LE Venous Doppler : No evidence of deep vein thrombosis involving the visualized veins of the right lower extremity. - No evidence of deep vein thrombosis involving the visualized veins of the left lower extremity. EKG - SR rate 75 BPM   LDL 44 and A1C 5.8  Assessment: As you may  recall, she is a 80 y.o. African American female with PMH of DM 2 with renal complications and peripheral neuropathy, ESRD on TTS hemodialysis, prior stroke (patient/family unaware that she had a stroke) with residual mild right-sided weakness, secondary hyperparathyroidism, gout, PAD with left toe wound, was admitted on 12/14/13 due to worsening right sided weakness as well as unsteady gait with right facial numbness. MRI showed acute left thalamus lacunar stroke and also remote b/l thalamic stroke with right cerebellar stroke, consistent with small vessel disease. She is on ASA 325. LDL was 44 so no statin was added. She had left toe osteomyelitis and possible unhealing wound, she may need amputation from vascular surgery standpoint. As for her stroke, she does not have large vessel stenosis or occlusion, she should not precluded from future peripheral vascular procedures due to her lacunar strokes. But recommend to avoid hypotension during anesthesia and surgical procedures.  Plan:  - continue ASA for stroke prevention - follow up with PCP for stroke prevention. Her LDL goal is <70, if more than 70, she should be on statin for HLD and for stroke prevention - continue to follow up with wound care, podiatry and vascular surgery for left toe osteomyelitis - from stroke standpoint, she does not have large vessel stenosis or occlusion, she should not precluded from future peripheral vascular procedures due to her lacunar strokes. But recommend to avoid hypotension during anesthesia and surgical procedures. - recommend to monitor BP and glucose at home. - RTC in 3 months.  No orders of the defined types were placed in this encounter.    Patient Instructions  - continue ASA for stroke prevention - follow up with PCP for stroke risk factor modification - continue to follow up with wound care, podiatry and vascular surgery for left toe osteomyelitis - from stroke standpoint, she does not have large vessel  stenosis or occlusion, she should not precluded from future peripheral vascular procedures due to her lacunar strokes. But recommend to avoid hypotension during anesthesia and surgical procedures. - recommend to monitor BP and glucose at home. - follow up in 3 months.   Marvel Plan, MD  PhD [facility] [address] [place], [place] [address] [phone]

## 2014-01-18 ENCOUNTER — Encounter: Payer: Self-pay | Admitting: Podiatry

## 2014-01-18 ENCOUNTER — Ambulatory Visit (INDEPENDENT_AMBULATORY_CARE_PROVIDER_SITE_OTHER): Payer: Medicare Other | Admitting: Podiatry

## 2014-01-18 VITALS — BP 108/66 | HR 60 | Resp 18

## 2014-01-18 DIAGNOSIS — M79606 Pain in leg, unspecified: Secondary | ICD-10-CM

## 2014-01-18 DIAGNOSIS — B351 Tinea unguium: Secondary | ICD-10-CM

## 2014-01-18 DIAGNOSIS — L97509 Non-pressure chronic ulcer of other part of unspecified foot with unspecified severity: Secondary | ICD-10-CM | POA: Diagnosis not present

## 2014-01-18 DIAGNOSIS — I739 Peripheral vascular disease, unspecified: Secondary | ICD-10-CM

## 2014-01-18 DIAGNOSIS — E1159 Type 2 diabetes mellitus with other circulatory complications: Secondary | ICD-10-CM

## 2014-01-18 DIAGNOSIS — Q828 Other specified congenital malformations of skin: Secondary | ICD-10-CM

## 2014-01-18 NOTE — Patient Instructions (Signed)
Follow up at the wound care center.  Please call me with any questions/concerns.

## 2014-01-19 NOTE — Progress Notes (Signed)
[facility]  NAME:  [name], [name] NO.:  [id number]  MEDICAL RECORD NO.:  [id number]      DATE OF BIRTH:  07-23-1932  PHYSICIAN:  Ardath Sax, M.D.           VISIT DATE:                                  OFFICE VISIT   [name] is a 80 year old lady who has type 2 diabetes.  She also has peripheral vascular disease.  She comes to [place] with an obvious osteomyelitis of her left great toe distal phalanx.  This lady is also on dialysis.  She has never had any coronary issues nor she had a stroke.  Her medicines are aspirin, Sensipar, Neurontin, Prilosec, and Ultram.  She had a blood sugar today of 200, and blood pressure 128/68. Her pulse 100, temperature 99.  This lady is 5 foot and 1 and weighs 189 pounds.  She has been on doxycycline and Zithromax for this infection on her left great toe.  Today, I debrided the distal phalanx with a bone rongeur and send it for microscopic and for culture.  She has had a vascular workup and apparently, there is no further therapy or surgery that they can do to help her.  I think in light of this lady's situation, we will continue the antibiotics and we will apply for her to get hyperbaric oxygen treatments and I will keep debriding away the distal phalanx and hopefully, we can get it to heal.  It is also strong possibility that she would have to have toe amputated, which may lead to a BK amputation if that does not heal.  So, I am going to keep up azithromycin and doxycycline and we will apply for hyperbaric oxygen treatments.  So, this lady's diagnoses are type 2 diabetes, hypertension, peripheral vascular disease, total renal failure.     Ardath Sax, M.D.     PP/MEDQ  D:  01/18/2014  T:  01/19/2014  Job:  [id number]

## 2014-01-19 NOTE — Progress Notes (Signed)
Patient ID: [name], female   DOB: 05/08/1933, 80 y.o.   MRN: [id number]  Subjective: [name] returns the office for followup evaluation of left hallux osteomyelitis. Since her last appointment she has been seen by Dr.Fields as well as the wound care center. Dr. Oneida Alar offered the patient a hallux amputation with a 50-50 chance of healing, and possible need for a proximal amputation. She was seen at the wound care center for which they said they removed some of the exposed bone. She will be starting hyperbaric oxygen treatment for 1 month. She currently denies any systemic complaints such as fevers, chills, nausea, vomiting. No new complaints today.  Objective: AAO x3, NAD- She is in better spirits today compared to when I first met her. She is hopeful for the hyperbarics. DP/PT pulses nonpalpable Decreased protective sensation with Derrel Nip monofilament, decreased vibratory sensation. Left hallux distal ulceration with exposed bone. Wound base is fibrotic. Hyperpigmentation to the hallux. No purulence. No increased warmth, fluctuance, crepitus. Remaining nails hypertrophic, dystrophic, yellow-brown discoloration.  Right hallux nail continues to be painful as it grows out. Dry skin on the plantar aspect of the feet much improved compared to prior. Small hyperkeratotic lesion right submetatarsal 5.  Assessment: 80 year old female with left hallux osteomyelitis, chronic ulceration, significant PAD.  Plan: -Various treatment options discussed with her and her niece including alternatives, risks, complications. -At this time I will defer to the [facility] for the left hallux wound, osteomyelitis. -Discussed with her and her niece that if he gets the point where it they want to proceed with a hallux amputation that she can either follow up with myself or with Dr. Oneida Alar.  -Right submetatarsal 5 hyperkeratotic lesions sharply debrided without any complication. -Right hallux toenail  sharply debrided to patient comfort. Discussed with her although the nail is painful given her circulation cannot remove the nail this time as she would likely end up in the same situation as she currently is on the left. We will continue periodic debridements. -Followup for periodic nail/callus debridement, diabetic risk assessment. Will see her in one month for this. In the meantime she is to call with any questions or concerns or any change in symptoms. Followup at the [facility].

## 2014-01-21 DIAGNOSIS — L97509 Non-pressure chronic ulcer of other part of unspecified foot with unspecified severity: Secondary | ICD-10-CM | POA: Diagnosis not present

## 2014-01-21 DIAGNOSIS — M869 Osteomyelitis, unspecified: Secondary | ICD-10-CM | POA: Diagnosis not present

## 2014-01-21 DIAGNOSIS — E1169 Type 2 diabetes mellitus with other specified complication: Secondary | ICD-10-CM | POA: Diagnosis not present

## 2014-01-21 DIAGNOSIS — M9080 Osteopathy in diseases classified elsewhere, unspecified site: Secondary | ICD-10-CM | POA: Diagnosis not present

## 2014-01-21 LAB — GLUCOSE, CAPILLARY
GLUCOSE-CAPILLARY: 125 mg/dL — AB (ref 70–99)
Glucose-Capillary: 143 mg/dL — ABNORMAL HIGH (ref 70–99)

## 2014-01-22 DIAGNOSIS — E1169 Type 2 diabetes mellitus with other specified complication: Secondary | ICD-10-CM | POA: Diagnosis not present

## 2014-01-22 DIAGNOSIS — M869 Osteomyelitis, unspecified: Secondary | ICD-10-CM | POA: Diagnosis not present

## 2014-01-22 DIAGNOSIS — M9080 Osteopathy in diseases classified elsewhere, unspecified site: Secondary | ICD-10-CM | POA: Diagnosis not present

## 2014-01-22 DIAGNOSIS — L97509 Non-pressure chronic ulcer of other part of unspecified foot with unspecified severity: Secondary | ICD-10-CM | POA: Diagnosis not present

## 2014-01-22 LAB — GLUCOSE, CAPILLARY
Glucose-Capillary: 140 mg/dL — ABNORMAL HIGH (ref 70–99)
Glucose-Capillary: 133 mg/dL — ABNORMAL HIGH (ref 70–99)

## 2014-01-23 DIAGNOSIS — M9080 Osteopathy in diseases classified elsewhere, unspecified site: Secondary | ICD-10-CM | POA: Diagnosis not present

## 2014-01-23 DIAGNOSIS — E1169 Type 2 diabetes mellitus with other specified complication: Secondary | ICD-10-CM | POA: Diagnosis not present

## 2014-01-23 DIAGNOSIS — M869 Osteomyelitis, unspecified: Secondary | ICD-10-CM | POA: Diagnosis not present

## 2014-01-23 DIAGNOSIS — L97509 Non-pressure chronic ulcer of other part of unspecified foot with unspecified severity: Secondary | ICD-10-CM | POA: Diagnosis not present

## 2014-01-23 LAB — GLUCOSE, CAPILLARY
GLUCOSE-CAPILLARY: 113 mg/dL — AB (ref 70–99)
GLUCOSE-CAPILLARY: 204 mg/dL — AB (ref 70–99)

## 2014-01-24 DIAGNOSIS — L97509 Non-pressure chronic ulcer of other part of unspecified foot with unspecified severity: Secondary | ICD-10-CM | POA: Diagnosis not present

## 2014-01-24 DIAGNOSIS — M869 Osteomyelitis, unspecified: Secondary | ICD-10-CM | POA: Diagnosis not present

## 2014-01-24 DIAGNOSIS — E1169 Type 2 diabetes mellitus with other specified complication: Secondary | ICD-10-CM | POA: Diagnosis not present

## 2014-01-24 DIAGNOSIS — M9080 Osteopathy in diseases classified elsewhere, unspecified site: Secondary | ICD-10-CM | POA: Diagnosis not present

## 2014-01-24 LAB — GLUCOSE, CAPILLARY
GLUCOSE-CAPILLARY: 107 mg/dL — AB (ref 70–99)
Glucose-Capillary: 100 mg/dL — ABNORMAL HIGH (ref 70–99)

## 2014-01-25 DIAGNOSIS — M869 Osteomyelitis, unspecified: Secondary | ICD-10-CM | POA: Diagnosis not present

## 2014-01-25 DIAGNOSIS — E1169 Type 2 diabetes mellitus with other specified complication: Secondary | ICD-10-CM | POA: Diagnosis not present

## 2014-01-25 DIAGNOSIS — L97509 Non-pressure chronic ulcer of other part of unspecified foot with unspecified severity: Secondary | ICD-10-CM | POA: Diagnosis not present

## 2014-01-25 DIAGNOSIS — M9080 Osteopathy in diseases classified elsewhere, unspecified site: Secondary | ICD-10-CM | POA: Diagnosis not present

## 2014-01-25 LAB — GLUCOSE, CAPILLARY
GLUCOSE-CAPILLARY: 128 mg/dL — AB (ref 70–99)
GLUCOSE-CAPILLARY: 116 mg/dL — AB (ref 70–99)

## 2014-01-28 DIAGNOSIS — L97509 Non-pressure chronic ulcer of other part of unspecified foot with unspecified severity: Secondary | ICD-10-CM | POA: Diagnosis not present

## 2014-01-28 DIAGNOSIS — M9080 Osteopathy in diseases classified elsewhere, unspecified site: Secondary | ICD-10-CM | POA: Diagnosis not present

## 2014-01-28 DIAGNOSIS — M869 Osteomyelitis, unspecified: Secondary | ICD-10-CM | POA: Diagnosis not present

## 2014-01-28 DIAGNOSIS — E1169 Type 2 diabetes mellitus with other specified complication: Secondary | ICD-10-CM | POA: Diagnosis not present

## 2014-01-28 LAB — GLUCOSE, CAPILLARY
GLUCOSE-CAPILLARY: 106 mg/dL — AB (ref 70–99)
Glucose-Capillary: 104 mg/dL — ABNORMAL HIGH (ref 70–99)

## 2014-01-29 DIAGNOSIS — L97509 Non-pressure chronic ulcer of other part of unspecified foot with unspecified severity: Secondary | ICD-10-CM | POA: Diagnosis not present

## 2014-01-29 DIAGNOSIS — M869 Osteomyelitis, unspecified: Secondary | ICD-10-CM | POA: Diagnosis not present

## 2014-01-29 DIAGNOSIS — M9080 Osteopathy in diseases classified elsewhere, unspecified site: Secondary | ICD-10-CM | POA: Diagnosis not present

## 2014-01-29 DIAGNOSIS — E1169 Type 2 diabetes mellitus with other specified complication: Secondary | ICD-10-CM | POA: Diagnosis not present

## 2014-01-29 LAB — GLUCOSE, CAPILLARY
GLUCOSE-CAPILLARY: 101 mg/dL — AB (ref 70–99)
Glucose-Capillary: 129 mg/dL — ABNORMAL HIGH (ref 70–99)
Glucose-Capillary: 168 mg/dL — ABNORMAL HIGH (ref 70–99)

## 2014-01-30 DIAGNOSIS — E1169 Type 2 diabetes mellitus with other specified complication: Secondary | ICD-10-CM | POA: Diagnosis not present

## 2014-01-30 DIAGNOSIS — M9080 Osteopathy in diseases classified elsewhere, unspecified site: Secondary | ICD-10-CM | POA: Diagnosis not present

## 2014-01-30 DIAGNOSIS — M869 Osteomyelitis, unspecified: Secondary | ICD-10-CM | POA: Diagnosis not present

## 2014-01-30 DIAGNOSIS — L97509 Non-pressure chronic ulcer of other part of unspecified foot with unspecified severity: Secondary | ICD-10-CM | POA: Diagnosis not present

## 2014-01-30 LAB — GLUCOSE, CAPILLARY
Glucose-Capillary: 126 mg/dL — ABNORMAL HIGH (ref 70–99)
Glucose-Capillary: 127 mg/dL — ABNORMAL HIGH (ref 70–99)

## 2014-01-31 DIAGNOSIS — M869 Osteomyelitis, unspecified: Secondary | ICD-10-CM | POA: Diagnosis not present

## 2014-01-31 DIAGNOSIS — E1169 Type 2 diabetes mellitus with other specified complication: Secondary | ICD-10-CM | POA: Diagnosis not present

## 2014-01-31 DIAGNOSIS — M9080 Osteopathy in diseases classified elsewhere, unspecified site: Secondary | ICD-10-CM | POA: Diagnosis not present

## 2014-01-31 DIAGNOSIS — L97509 Non-pressure chronic ulcer of other part of unspecified foot with unspecified severity: Secondary | ICD-10-CM | POA: Diagnosis not present

## 2014-01-31 LAB — GLUCOSE, CAPILLARY
GLUCOSE-CAPILLARY: 120 mg/dL — AB (ref 70–99)
Glucose-Capillary: 139 mg/dL — ABNORMAL HIGH (ref 70–99)

## 2014-02-01 DIAGNOSIS — E1169 Type 2 diabetes mellitus with other specified complication: Secondary | ICD-10-CM | POA: Diagnosis not present

## 2014-02-01 DIAGNOSIS — L97509 Non-pressure chronic ulcer of other part of unspecified foot with unspecified severity: Secondary | ICD-10-CM | POA: Diagnosis not present

## 2014-02-01 DIAGNOSIS — M9080 Osteopathy in diseases classified elsewhere, unspecified site: Secondary | ICD-10-CM | POA: Diagnosis not present

## 2014-02-01 DIAGNOSIS — M869 Osteomyelitis, unspecified: Secondary | ICD-10-CM | POA: Diagnosis not present

## 2014-02-01 LAB — GLUCOSE, CAPILLARY
GLUCOSE-CAPILLARY: 106 mg/dL — AB (ref 70–99)
Glucose-Capillary: 106 mg/dL — ABNORMAL HIGH (ref 70–99)
Glucose-Capillary: 137 mg/dL — ABNORMAL HIGH (ref 70–99)

## 2014-02-04 DIAGNOSIS — L97509 Non-pressure chronic ulcer of other part of unspecified foot with unspecified severity: Secondary | ICD-10-CM | POA: Diagnosis not present

## 2014-02-04 DIAGNOSIS — E1169 Type 2 diabetes mellitus with other specified complication: Secondary | ICD-10-CM | POA: Diagnosis not present

## 2014-02-04 DIAGNOSIS — M9080 Osteopathy in diseases classified elsewhere, unspecified site: Secondary | ICD-10-CM | POA: Diagnosis not present

## 2014-02-04 DIAGNOSIS — M869 Osteomyelitis, unspecified: Secondary | ICD-10-CM | POA: Diagnosis not present

## 2014-02-04 LAB — GLUCOSE, CAPILLARY
Glucose-Capillary: 128 mg/dL — ABNORMAL HIGH (ref 70–99)
Glucose-Capillary: 104 mg/dL — ABNORMAL HIGH (ref 70–99)

## 2014-02-05 DIAGNOSIS — E1169 Type 2 diabetes mellitus with other specified complication: Secondary | ICD-10-CM | POA: Diagnosis not present

## 2014-02-05 DIAGNOSIS — M9080 Osteopathy in diseases classified elsewhere, unspecified site: Secondary | ICD-10-CM | POA: Diagnosis not present

## 2014-02-05 DIAGNOSIS — L97509 Non-pressure chronic ulcer of other part of unspecified foot with unspecified severity: Secondary | ICD-10-CM | POA: Diagnosis not present

## 2014-02-05 DIAGNOSIS — M869 Osteomyelitis, unspecified: Secondary | ICD-10-CM | POA: Diagnosis not present

## 2014-02-05 LAB — GLUCOSE, CAPILLARY
GLUCOSE-CAPILLARY: 118 mg/dL — AB (ref 70–99)
Glucose-Capillary: 113 mg/dL — ABNORMAL HIGH (ref 70–99)
Glucose-Capillary: 179 mg/dL — ABNORMAL HIGH (ref 70–99)

## 2014-02-06 DIAGNOSIS — M869 Osteomyelitis, unspecified: Secondary | ICD-10-CM | POA: Diagnosis not present

## 2014-02-06 DIAGNOSIS — E1169 Type 2 diabetes mellitus with other specified complication: Secondary | ICD-10-CM | POA: Diagnosis not present

## 2014-02-06 DIAGNOSIS — L97509 Non-pressure chronic ulcer of other part of unspecified foot with unspecified severity: Secondary | ICD-10-CM | POA: Diagnosis not present

## 2014-02-06 DIAGNOSIS — N186 End stage renal disease: Secondary | ICD-10-CM | POA: Diagnosis not present

## 2014-02-06 DIAGNOSIS — M9080 Osteopathy in diseases classified elsewhere, unspecified site: Secondary | ICD-10-CM | POA: Diagnosis not present

## 2014-02-06 LAB — GLUCOSE, CAPILLARY
GLUCOSE-CAPILLARY: 145 mg/dL — AB (ref 70–99)
GLUCOSE-CAPILLARY: 128 mg/dL — AB (ref 70–99)

## 2014-02-07 ENCOUNTER — Encounter (HOSPITAL_BASED_OUTPATIENT_CLINIC_OR_DEPARTMENT_OTHER): Payer: Medicare Other | Attending: General Surgery

## 2014-02-07 DIAGNOSIS — E1129 Type 2 diabetes mellitus with other diabetic kidney complication: Secondary | ICD-10-CM | POA: Diagnosis not present

## 2014-02-07 DIAGNOSIS — L97529 Non-pressure chronic ulcer of other part of left foot with unspecified severity: Secondary | ICD-10-CM | POA: Insufficient documentation

## 2014-02-07 DIAGNOSIS — N186 End stage renal disease: Secondary | ICD-10-CM | POA: Diagnosis not present

## 2014-02-07 DIAGNOSIS — E11621 Type 2 diabetes mellitus with foot ulcer: Secondary | ICD-10-CM | POA: Insufficient documentation

## 2014-02-07 DIAGNOSIS — N2581 Secondary hyperparathyroidism of renal origin: Secondary | ICD-10-CM | POA: Diagnosis not present

## 2014-02-07 DIAGNOSIS — Z23 Encounter for immunization: Secondary | ICD-10-CM | POA: Diagnosis not present

## 2014-02-07 DIAGNOSIS — D631 Anemia in chronic kidney disease: Secondary | ICD-10-CM | POA: Diagnosis not present

## 2014-02-07 LAB — GLUCOSE, CAPILLARY
GLUCOSE-CAPILLARY: 124 mg/dL — AB (ref 70–99)
GLUCOSE-CAPILLARY: 156 mg/dL — AB (ref 70–99)
Glucose-Capillary: 88 mg/dL (ref 70–99)

## 2014-02-08 DIAGNOSIS — L97529 Non-pressure chronic ulcer of other part of left foot with unspecified severity: Secondary | ICD-10-CM | POA: Diagnosis not present

## 2014-02-08 DIAGNOSIS — E11621 Type 2 diabetes mellitus with foot ulcer: Secondary | ICD-10-CM | POA: Diagnosis not present

## 2014-02-11 DIAGNOSIS — L97529 Non-pressure chronic ulcer of other part of left foot with unspecified severity: Secondary | ICD-10-CM | POA: Diagnosis not present

## 2014-02-11 DIAGNOSIS — E11621 Type 2 diabetes mellitus with foot ulcer: Secondary | ICD-10-CM | POA: Diagnosis not present

## 2014-02-11 LAB — GLUCOSE, CAPILLARY
GLUCOSE-CAPILLARY: 106 mg/dL — AB (ref 70–99)
Glucose-Capillary: 101 mg/dL — ABNORMAL HIGH (ref 70–99)

## 2014-02-12 DIAGNOSIS — E11621 Type 2 diabetes mellitus with foot ulcer: Secondary | ICD-10-CM | POA: Diagnosis not present

## 2014-02-12 DIAGNOSIS — L97529 Non-pressure chronic ulcer of other part of left foot with unspecified severity: Secondary | ICD-10-CM | POA: Diagnosis not present

## 2014-02-12 LAB — GLUCOSE, CAPILLARY
GLUCOSE-CAPILLARY: 96 mg/dL (ref 70–99)
Glucose-Capillary: 114 mg/dL — ABNORMAL HIGH (ref 70–99)
Glucose-Capillary: 132 mg/dL — ABNORMAL HIGH (ref 70–99)
Glucose-Capillary: 100 mg/dL — ABNORMAL HIGH (ref 70–99)

## 2014-02-13 DIAGNOSIS — L97529 Non-pressure chronic ulcer of other part of left foot with unspecified severity: Secondary | ICD-10-CM | POA: Diagnosis not present

## 2014-02-13 DIAGNOSIS — E11621 Type 2 diabetes mellitus with foot ulcer: Secondary | ICD-10-CM | POA: Diagnosis not present

## 2014-02-13 LAB — GLUCOSE, CAPILLARY
Glucose-Capillary: 131 mg/dL — ABNORMAL HIGH (ref 70–99)
Glucose-Capillary: 115 mg/dL — ABNORMAL HIGH (ref 70–99)

## 2014-02-14 DIAGNOSIS — E11621 Type 2 diabetes mellitus with foot ulcer: Secondary | ICD-10-CM | POA: Diagnosis not present

## 2014-02-14 DIAGNOSIS — L97529 Non-pressure chronic ulcer of other part of left foot with unspecified severity: Secondary | ICD-10-CM | POA: Diagnosis not present

## 2014-02-14 LAB — GLUCOSE, CAPILLARY
GLUCOSE-CAPILLARY: 103 mg/dL — AB (ref 70–99)
Glucose-Capillary: 92 mg/dL (ref 70–99)

## 2014-02-15 DIAGNOSIS — E11621 Type 2 diabetes mellitus with foot ulcer: Secondary | ICD-10-CM | POA: Diagnosis not present

## 2014-02-15 DIAGNOSIS — L97529 Non-pressure chronic ulcer of other part of left foot with unspecified severity: Secondary | ICD-10-CM | POA: Diagnosis not present

## 2014-02-15 LAB — GLUCOSE, CAPILLARY
GLUCOSE-CAPILLARY: 95 mg/dL (ref 70–99)
Glucose-Capillary: 92 mg/dL (ref 70–99)

## 2014-02-18 DIAGNOSIS — L97529 Non-pressure chronic ulcer of other part of left foot with unspecified severity: Secondary | ICD-10-CM | POA: Diagnosis not present

## 2014-02-18 DIAGNOSIS — E11621 Type 2 diabetes mellitus with foot ulcer: Secondary | ICD-10-CM | POA: Diagnosis not present

## 2014-02-18 LAB — GLUCOSE, CAPILLARY
Glucose-Capillary: 102 mg/dL — ABNORMAL HIGH (ref 70–99)
Glucose-Capillary: 110 mg/dL — ABNORMAL HIGH (ref 70–99)

## 2014-02-19 DIAGNOSIS — E11621 Type 2 diabetes mellitus with foot ulcer: Secondary | ICD-10-CM | POA: Diagnosis not present

## 2014-02-19 DIAGNOSIS — L97529 Non-pressure chronic ulcer of other part of left foot with unspecified severity: Secondary | ICD-10-CM | POA: Diagnosis not present

## 2014-02-19 LAB — GLUCOSE, CAPILLARY
GLUCOSE-CAPILLARY: 101 mg/dL — AB (ref 70–99)
Glucose-Capillary: 117 mg/dL — ABNORMAL HIGH (ref 70–99)
Glucose-Capillary: 119 mg/dL — ABNORMAL HIGH (ref 70–99)

## 2014-02-20 DIAGNOSIS — L97529 Non-pressure chronic ulcer of other part of left foot with unspecified severity: Secondary | ICD-10-CM | POA: Diagnosis not present

## 2014-02-20 DIAGNOSIS — E11621 Type 2 diabetes mellitus with foot ulcer: Secondary | ICD-10-CM | POA: Diagnosis not present

## 2014-02-20 LAB — GLUCOSE, CAPILLARY
GLUCOSE-CAPILLARY: 115 mg/dL — AB (ref 70–99)
Glucose-Capillary: 120 mg/dL — ABNORMAL HIGH (ref 70–99)

## 2014-02-21 DIAGNOSIS — L97529 Non-pressure chronic ulcer of other part of left foot with unspecified severity: Secondary | ICD-10-CM | POA: Diagnosis not present

## 2014-02-21 DIAGNOSIS — E11621 Type 2 diabetes mellitus with foot ulcer: Secondary | ICD-10-CM | POA: Diagnosis not present

## 2014-02-21 LAB — GLUCOSE, CAPILLARY
GLUCOSE-CAPILLARY: 126 mg/dL — AB (ref 70–99)
GLUCOSE-CAPILLARY: 112 mg/dL — AB (ref 70–99)

## 2014-02-22 DIAGNOSIS — L97529 Non-pressure chronic ulcer of other part of left foot with unspecified severity: Secondary | ICD-10-CM | POA: Diagnosis not present

## 2014-02-22 DIAGNOSIS — E11621 Type 2 diabetes mellitus with foot ulcer: Secondary | ICD-10-CM | POA: Diagnosis not present

## 2014-02-22 LAB — GLUCOSE, CAPILLARY
GLUCOSE-CAPILLARY: 113 mg/dL — AB (ref 70–99)
GLUCOSE-CAPILLARY: 115 mg/dL — AB (ref 70–99)
Glucose-Capillary: 188 mg/dL — ABNORMAL HIGH (ref 70–99)

## 2014-02-25 DIAGNOSIS — L97529 Non-pressure chronic ulcer of other part of left foot with unspecified severity: Secondary | ICD-10-CM | POA: Diagnosis not present

## 2014-02-25 DIAGNOSIS — E11621 Type 2 diabetes mellitus with foot ulcer: Secondary | ICD-10-CM | POA: Diagnosis not present

## 2014-02-25 LAB — GLUCOSE, CAPILLARY
Glucose-Capillary: 136 mg/dL — ABNORMAL HIGH (ref 70–99)
Glucose-Capillary: 101 mg/dL — ABNORMAL HIGH (ref 70–99)

## 2014-02-26 DIAGNOSIS — E11621 Type 2 diabetes mellitus with foot ulcer: Secondary | ICD-10-CM | POA: Diagnosis not present

## 2014-02-26 DIAGNOSIS — L97529 Non-pressure chronic ulcer of other part of left foot with unspecified severity: Secondary | ICD-10-CM | POA: Diagnosis not present

## 2014-02-26 LAB — GLUCOSE, CAPILLARY
GLUCOSE-CAPILLARY: 103 mg/dL — AB (ref 70–99)
Glucose-Capillary: 110 mg/dL — ABNORMAL HIGH (ref 70–99)

## 2014-02-27 DIAGNOSIS — L97529 Non-pressure chronic ulcer of other part of left foot with unspecified severity: Secondary | ICD-10-CM | POA: Diagnosis not present

## 2014-02-27 DIAGNOSIS — E11621 Type 2 diabetes mellitus with foot ulcer: Secondary | ICD-10-CM | POA: Diagnosis not present

## 2014-02-27 LAB — GLUCOSE, CAPILLARY
GLUCOSE-CAPILLARY: 105 mg/dL — AB (ref 70–99)
Glucose-Capillary: 230 mg/dL — ABNORMAL HIGH (ref 70–99)

## 2014-02-28 DIAGNOSIS — E11621 Type 2 diabetes mellitus with foot ulcer: Secondary | ICD-10-CM | POA: Diagnosis not present

## 2014-02-28 DIAGNOSIS — L97529 Non-pressure chronic ulcer of other part of left foot with unspecified severity: Secondary | ICD-10-CM | POA: Diagnosis not present

## 2014-02-28 LAB — GLUCOSE, CAPILLARY
Glucose-Capillary: 132 mg/dL — ABNORMAL HIGH (ref 70–99)
Glucose-Capillary: 114 mg/dL — ABNORMAL HIGH (ref 70–99)

## 2014-02-28 NOTE — Progress Notes (Signed)
[facility]  NAME:  Stephan MinisterGRAVES, [name]                ACCOUNT NO.:  [id number]  MEDICAL RECORD NO.:  [id number]      DATE OF BIRTH:  February 03, 1933  PHYSICIAN:  Ardath SaxPeter Inari Shin, M.D.           VISIT DATE:                                  OFFICE VISIT   Stacy GardnerBetty [name] is a 80 year old lady, who is a diabetic and she has had great problems with a ulcer on the tip of her left great toe.  She has been treated with antibiotics and silver alginate and multiple debridements.  There is evidence by x-ray of bony involvement of the distal phalanx.  We are treating this with debriding the affected bone and hyperbaric oxygen along with the antibiotics.  We are hoping that with this treatment, we will be able to control her problems with the toe with just a simple amputation of the distal phalanx.  In order to do that, we need the continued antibiotics and HBO and perhaps this will help us to save the toe and prevent a transmetatarsal amputation. Therefore, we are applying to her insurance company for another 30 days of hyperbaric oxygen treatments while all the other treatments continue.     Ardath SaxPeter Kyndel Egger, M.D.     PP/MEDQ  D:  02/27/2014  T:  02/28/2014  Job:  [id number]

## 2014-03-01 DIAGNOSIS — L97529 Non-pressure chronic ulcer of other part of left foot with unspecified severity: Secondary | ICD-10-CM | POA: Diagnosis not present

## 2014-03-01 DIAGNOSIS — E11621 Type 2 diabetes mellitus with foot ulcer: Secondary | ICD-10-CM | POA: Diagnosis not present

## 2014-03-01 LAB — GLUCOSE, CAPILLARY
GLUCOSE-CAPILLARY: 182 mg/dL — AB (ref 70–99)
Glucose-Capillary: 164 mg/dL — ABNORMAL HIGH (ref 70–99)

## 2014-03-04 DIAGNOSIS — I871 Compression of vein: Secondary | ICD-10-CM | POA: Diagnosis not present

## 2014-03-04 DIAGNOSIS — Z992 Dependence on renal dialysis: Secondary | ICD-10-CM | POA: Diagnosis not present

## 2014-03-04 DIAGNOSIS — N186 End stage renal disease: Secondary | ICD-10-CM | POA: Diagnosis not present

## 2014-03-04 DIAGNOSIS — E11621 Type 2 diabetes mellitus with foot ulcer: Secondary | ICD-10-CM | POA: Diagnosis not present

## 2014-03-04 DIAGNOSIS — T82858A Stenosis of vascular prosthetic devices, implants and grafts, initial encounter: Secondary | ICD-10-CM | POA: Diagnosis not present

## 2014-03-04 LAB — GLUCOSE, CAPILLARY
GLUCOSE-CAPILLARY: 106 mg/dL — AB (ref 70–99)
Glucose-Capillary: 92 mg/dL (ref 70–99)
Glucose-Capillary: 96 mg/dL (ref 70–99)
Glucose-Capillary: 99 mg/dL (ref 70–99)

## 2014-03-05 DIAGNOSIS — L97529 Non-pressure chronic ulcer of other part of left foot with unspecified severity: Secondary | ICD-10-CM | POA: Diagnosis not present

## 2014-03-05 DIAGNOSIS — E11621 Type 2 diabetes mellitus with foot ulcer: Secondary | ICD-10-CM | POA: Diagnosis not present

## 2014-03-05 LAB — GLUCOSE, CAPILLARY
GLUCOSE-CAPILLARY: 153 mg/dL — AB (ref 70–99)
GLUCOSE-CAPILLARY: 109 mg/dL — AB (ref 70–99)

## 2014-03-06 DIAGNOSIS — E11621 Type 2 diabetes mellitus with foot ulcer: Secondary | ICD-10-CM | POA: Diagnosis not present

## 2014-03-06 DIAGNOSIS — L97529 Non-pressure chronic ulcer of other part of left foot with unspecified severity: Secondary | ICD-10-CM | POA: Diagnosis not present

## 2014-03-06 LAB — GLUCOSE, CAPILLARY
Glucose-Capillary: 94 mg/dL (ref 70–99)
Glucose-Capillary: 114 mg/dL — ABNORMAL HIGH (ref 70–99)

## 2014-03-07 DIAGNOSIS — E1129 Type 2 diabetes mellitus with other diabetic kidney complication: Secondary | ICD-10-CM | POA: Diagnosis not present

## 2014-03-07 DIAGNOSIS — L97529 Non-pressure chronic ulcer of other part of left foot with unspecified severity: Secondary | ICD-10-CM | POA: Diagnosis not present

## 2014-03-07 DIAGNOSIS — E11621 Type 2 diabetes mellitus with foot ulcer: Secondary | ICD-10-CM | POA: Diagnosis not present

## 2014-03-07 LAB — GLUCOSE, CAPILLARY
Glucose-Capillary: 121 mg/dL — ABNORMAL HIGH (ref 70–99)
Glucose-Capillary: 127 mg/dL — ABNORMAL HIGH (ref 70–99)

## 2014-03-08 DIAGNOSIS — L97529 Non-pressure chronic ulcer of other part of left foot with unspecified severity: Secondary | ICD-10-CM | POA: Diagnosis not present

## 2014-03-08 DIAGNOSIS — E11621 Type 2 diabetes mellitus with foot ulcer: Secondary | ICD-10-CM | POA: Diagnosis not present

## 2014-03-08 LAB — GLUCOSE, CAPILLARY
Glucose-Capillary: 148 mg/dL — ABNORMAL HIGH (ref 70–99)
Glucose-Capillary: 111 mg/dL — ABNORMAL HIGH (ref 70–99)

## 2014-03-09 DIAGNOSIS — N186 End stage renal disease: Secondary | ICD-10-CM | POA: Diagnosis not present

## 2014-03-11 ENCOUNTER — Encounter (HOSPITAL_BASED_OUTPATIENT_CLINIC_OR_DEPARTMENT_OTHER): Payer: Medicare Other | Attending: General Surgery

## 2014-03-11 DIAGNOSIS — Z89412 Acquired absence of left great toe: Secondary | ICD-10-CM | POA: Diagnosis not present

## 2014-03-11 DIAGNOSIS — E11621 Type 2 diabetes mellitus with foot ulcer: Secondary | ICD-10-CM | POA: Diagnosis not present

## 2014-03-11 DIAGNOSIS — T8744 Infection of amputation stump, left lower extremity: Secondary | ICD-10-CM | POA: Insufficient documentation

## 2014-03-11 DIAGNOSIS — L97529 Non-pressure chronic ulcer of other part of left foot with unspecified severity: Secondary | ICD-10-CM | POA: Insufficient documentation

## 2014-03-11 DIAGNOSIS — Y839 Surgical procedure, unspecified as the cause of abnormal reaction of the patient, or of later complication, without mention of misadventure at the time of the procedure: Secondary | ICD-10-CM | POA: Diagnosis not present

## 2014-03-12 DIAGNOSIS — Z89412 Acquired absence of left great toe: Secondary | ICD-10-CM | POA: Diagnosis not present

## 2014-03-12 DIAGNOSIS — E1129 Type 2 diabetes mellitus with other diabetic kidney complication: Secondary | ICD-10-CM | POA: Diagnosis not present

## 2014-03-12 DIAGNOSIS — T8744 Infection of amputation stump, left lower extremity: Secondary | ICD-10-CM | POA: Diagnosis not present

## 2014-03-12 DIAGNOSIS — N2581 Secondary hyperparathyroidism of renal origin: Secondary | ICD-10-CM | POA: Diagnosis not present

## 2014-03-12 DIAGNOSIS — E11621 Type 2 diabetes mellitus with foot ulcer: Secondary | ICD-10-CM | POA: Diagnosis not present

## 2014-03-12 DIAGNOSIS — N186 End stage renal disease: Secondary | ICD-10-CM | POA: Diagnosis not present

## 2014-03-12 DIAGNOSIS — L97529 Non-pressure chronic ulcer of other part of left foot with unspecified severity: Secondary | ICD-10-CM | POA: Diagnosis not present

## 2014-03-12 DIAGNOSIS — D631 Anemia in chronic kidney disease: Secondary | ICD-10-CM | POA: Diagnosis not present

## 2014-03-12 LAB — GLUCOSE, CAPILLARY
GLUCOSE-CAPILLARY: 111 mg/dL — AB (ref 70–99)
GLUCOSE-CAPILLARY: 119 mg/dL — AB (ref 70–99)
GLUCOSE-CAPILLARY: 103 mg/dL — AB (ref 70–99)
Glucose-Capillary: 115 mg/dL — ABNORMAL HIGH (ref 70–99)

## 2014-03-13 ENCOUNTER — Ambulatory Visit (INDEPENDENT_AMBULATORY_CARE_PROVIDER_SITE_OTHER): Payer: Medicare Other | Admitting: Internal Medicine

## 2014-03-13 ENCOUNTER — Encounter: Payer: Self-pay | Admitting: Internal Medicine

## 2014-03-13 VITALS — BP 134/89 | HR 121 | Temp 98.2°F | Wt 192.0 lb

## 2014-03-13 DIAGNOSIS — T8744 Infection of amputation stump, left lower extremity: Secondary | ICD-10-CM | POA: Diagnosis not present

## 2014-03-13 DIAGNOSIS — E1169 Type 2 diabetes mellitus with other specified complication: Secondary | ICD-10-CM | POA: Diagnosis not present

## 2014-03-13 DIAGNOSIS — Z89412 Acquired absence of left great toe: Secondary | ICD-10-CM | POA: Diagnosis not present

## 2014-03-13 DIAGNOSIS — M9080 Osteopathy in diseases classified elsewhere, unspecified site: Secondary | ICD-10-CM | POA: Diagnosis not present

## 2014-03-13 DIAGNOSIS — I739 Peripheral vascular disease, unspecified: Secondary | ICD-10-CM

## 2014-03-13 DIAGNOSIS — E11621 Type 2 diabetes mellitus with foot ulcer: Secondary | ICD-10-CM | POA: Diagnosis not present

## 2014-03-13 DIAGNOSIS — L98499 Non-pressure chronic ulcer of skin of other sites with unspecified severity: Secondary | ICD-10-CM | POA: Diagnosis not present

## 2014-03-13 DIAGNOSIS — L97529 Non-pressure chronic ulcer of other part of left foot with unspecified severity: Secondary | ICD-10-CM | POA: Diagnosis not present

## 2014-03-13 DIAGNOSIS — M869 Osteomyelitis, unspecified: Secondary | ICD-10-CM | POA: Diagnosis not present

## 2014-03-14 ENCOUNTER — Telehealth: Payer: Self-pay | Admitting: *Deleted

## 2014-03-14 DIAGNOSIS — T8744 Infection of amputation stump, left lower extremity: Secondary | ICD-10-CM | POA: Diagnosis not present

## 2014-03-14 DIAGNOSIS — Z89412 Acquired absence of left great toe: Secondary | ICD-10-CM | POA: Diagnosis not present

## 2014-03-14 DIAGNOSIS — L97529 Non-pressure chronic ulcer of other part of left foot with unspecified severity: Secondary | ICD-10-CM | POA: Diagnosis not present

## 2014-03-14 DIAGNOSIS — E11621 Type 2 diabetes mellitus with foot ulcer: Secondary | ICD-10-CM | POA: Diagnosis not present

## 2014-03-14 LAB — GLUCOSE, CAPILLARY
Glucose-Capillary: 130 mg/dL — ABNORMAL HIGH (ref 70–99)
Glucose-Capillary: 107 mg/dL — ABNORMAL HIGH (ref 70–99)

## 2014-03-14 NOTE — Telephone Encounter (Signed)
See if she can come in tomorrow. Thanks.

## 2014-03-14 NOTE — Telephone Encounter (Signed)
Per Dr Drue SecondSnider called [facility] and was able to get the patient an appt for Friday 03/15/14 at 215 pm. Called the patient niece and left the message and advised if she has a conflict to call the office directly at [phone]

## 2014-03-14 NOTE — Telephone Encounter (Signed)
Called and left a voicemail for patient to call and schedule an appointment with Dr. Ardelle AntonWagoner.

## 2014-03-14 NOTE — Telephone Encounter (Signed)
I saw this patient.  She would like to move forward with getting her toe amputation.  Please give [name] a call for an appointment, I'd gladly appreciate it.  Call our office if you have any questions.

## 2014-03-14 NOTE — Telephone Encounter (Signed)
Called and left a voicemail for patient to call and schedule an appointment with Dr. Wagoner. °

## 2014-03-15 ENCOUNTER — Ambulatory Visit (INDEPENDENT_AMBULATORY_CARE_PROVIDER_SITE_OTHER): Payer: Medicare Other

## 2014-03-15 ENCOUNTER — Ambulatory Visit: Payer: Medicare Other

## 2014-03-15 ENCOUNTER — Ambulatory Visit (INDEPENDENT_AMBULATORY_CARE_PROVIDER_SITE_OTHER): Payer: Medicare Other | Admitting: Podiatry

## 2014-03-15 ENCOUNTER — Encounter: Payer: Self-pay | Admitting: Podiatry

## 2014-03-15 VITALS — BP 148/44 | HR 84 | Resp 15

## 2014-03-15 DIAGNOSIS — L98499 Non-pressure chronic ulcer of skin of other sites with unspecified severity: Secondary | ICD-10-CM | POA: Diagnosis not present

## 2014-03-15 DIAGNOSIS — E1151 Type 2 diabetes mellitus with diabetic peripheral angiopathy without gangrene: Secondary | ICD-10-CM

## 2014-03-15 DIAGNOSIS — E11621 Type 2 diabetes mellitus with foot ulcer: Secondary | ICD-10-CM | POA: Diagnosis not present

## 2014-03-15 DIAGNOSIS — L97524 Non-pressure chronic ulcer of other part of left foot with necrosis of bone: Secondary | ICD-10-CM

## 2014-03-15 DIAGNOSIS — M79675 Pain in left toe(s): Secondary | ICD-10-CM | POA: Diagnosis not present

## 2014-03-15 DIAGNOSIS — I739 Peripheral vascular disease, unspecified: Secondary | ICD-10-CM | POA: Diagnosis not present

## 2014-03-15 DIAGNOSIS — M79676 Pain in unspecified toe(s): Secondary | ICD-10-CM

## 2014-03-15 DIAGNOSIS — L97519 Non-pressure chronic ulcer of other part of right foot with unspecified severity: Secondary | ICD-10-CM

## 2014-03-15 DIAGNOSIS — E1159 Type 2 diabetes mellitus with other circulatory complications: Secondary | ICD-10-CM | POA: Diagnosis not present

## 2014-03-15 DIAGNOSIS — B351 Tinea unguium: Secondary | ICD-10-CM | POA: Diagnosis not present

## 2014-03-15 NOTE — Patient Instructions (Signed)
Continue dressing changes on the right foot.  Monitor for any signs/symptoms of infection. Call the office immediately if any occur or go directly to the emergency room. Call with any questions/concerns.

## 2014-03-18 DIAGNOSIS — E11621 Type 2 diabetes mellitus with foot ulcer: Secondary | ICD-10-CM | POA: Diagnosis not present

## 2014-03-18 DIAGNOSIS — L97529 Non-pressure chronic ulcer of other part of left foot with unspecified severity: Secondary | ICD-10-CM | POA: Diagnosis not present

## 2014-03-18 DIAGNOSIS — Z89412 Acquired absence of left great toe: Secondary | ICD-10-CM | POA: Diagnosis not present

## 2014-03-18 DIAGNOSIS — T8744 Infection of amputation stump, left lower extremity: Secondary | ICD-10-CM | POA: Diagnosis not present

## 2014-03-18 LAB — GLUCOSE, CAPILLARY
Glucose-Capillary: 145 mg/dL — ABNORMAL HIGH (ref 70–99)
Glucose-Capillary: 105 mg/dL — ABNORMAL HIGH (ref 70–99)
Glucose-Capillary: 119 mg/dL — ABNORMAL HIGH (ref 70–99)
Glucose-Capillary: 115 mg/dL — ABNORMAL HIGH (ref 70–99)

## 2014-03-18 NOTE — Progress Notes (Signed)
RFV: osteomyelitis of left hallux Subjective:    Patient ID: [name], female    DOB: 09/11/1932, 80 y.o.   MRN: [id number]  HPI [name] is a 80 year old lady, with DM c/b peripheral neuropathy, past hx of c.difficile, ESRD on DM, hx of CVA, osteomyelitis of left hallux. She has had an ulcer  on the tip of her left great toe not responding to HBO which she has done for nearly 2 months, local treatment with silver alginate and multiple debridements. Xray shows signs c/w osteomyelitis. She has been on chronic antibiotics with doxycycline,and ciprofloxacin. She is not a candidate for revascularization. Seen by Dr. Jimmey RalphParker at wound care, Dr. Fransisco Beauwaggoner through podiatry and Dr. Darrick PennaFields with vascular who have given recommendations that since her toe is not improving, that 1st ray amputation/ transmetatarsal amputation maybe the next steps.   Allergies  Allergen Reactions  . Codeine Hives  . Vancomycin Rash   Current Outpatient Prescriptions on File Prior to Visit  Medication Sig Dispense Refill  . acetaminophen (TYLENOL) 500 MG tablet Take 500-1,000 mg by mouth every 6 (six) hours as needed.    Marland Kitchen. aspirin 325 MG EC tablet Take 325 mg by mouth daily.    . ciprofloxacin (CIPRO) 250 MG tablet Take 1 tablet (250 mg total) by mouth 2 (two) times daily. 30 tablet 0  . gabapentin (NEURONTIN) 100 MG capsule Take 100 mg by mouth 2 (two) times daily.     Marland Kitchen. omeprazole (PRILOSEC) 20 MG capsule Take 1 capsule (20 mg total) by mouth daily. 90 capsule 3  . SENSIPAR 30 MG tablet Take 30 mg by mouth every evening.     . traMADol (ULTRAM) 50 MG tablet Take 1 tablet (50 mg total) by mouth every 6 (six) hours as needed. 30 tablet 0  . doxycycline (VIBRAMYCIN) 100 MG capsule Take 1 capsule (100 mg total) by mouth 2 (two) times daily. 30 capsule 0  . lanthanum (FOSRENOL) 1000 MG chewable tablet Chew 1,000-2,000 mg by mouth 3 (three) times daily with meals. Take 2000 mg by mouth 3 times daily with meals and take 1000mg   by mouth with snacks.    . silver sulfADIAZINE (SILVADENE) 1 % cream Apply 1 application topically daily. 50 g 0   No current facility-administered medications on file prior to visit.   Active Ambulatory Problems    Diagnosis Date Noted  . ESRD on dialysis 10/06/2012  . Hyperkalemia 10/31/2012  . Diabetes mellitus 10/31/2012  . Depression 10/31/2012  . Leukocytosis 10/31/2012  . Anemia 10/31/2012  . Acute respiratory distress 10/31/2012  . Pneumonia 10/31/2012  . C. difficile colitis 10/31/2012  . Chronic diarrhea 10/31/2012  . Sepsis due to Haemophilus influenzae 11/07/2012  . End stage renal disease 12/29/2012  . Peripheral arterial disease 10/26/2013  . Dysphagia, unspecified(787.20) 11/28/2013  . CVA (cerebral infarction) 12/14/2013  . Atherosclerosis of native arteries of the extremities with ulceration(440.23) 01/10/2014   Resolved Ambulatory Problems    Diagnosis Date Noted  . Sepsis 10/31/2012  . Acute bronchitis 10/31/2012  . Tachycardia 10/31/2012  . Tachypnea 10/31/2012   Past Medical History  Diagnosis Date  . Diabetes mellitus   . Hyperparathyroidism   . Obesity   . Osteoarthritis   . MRSA (methicillin resistant staph aureus) culture positive   . Gout   . C. difficile diarrhea   . Chronic kidney disease   . Diabetic neuropathy   . GERD (gastroesophageal reflux disease)   . Stroke    History  Substance  Use Topics  . Smoking status: Former Smoker    Types: Cigarettes  . Smokeless tobacco: Never Used  . Alcohol Use: No  family history includes Cancer in her brother and father; Diabetes in her sister; Stroke in her mother.    Review of Systems No fever, chills, nightsweats, mild drainage from toe, no diarrhea, no vaginal candidiasis. 10 point ros is negative    Objective:   Physical Exam BP 134/89 mmHg  Pulse 121  Temp(Src) 98.2 F (36.8 C) (Oral)  Wt 192 lb (87.091 kg) Physical Exam  Constitutional:  oriented to person, place, and time.  appears well-developed and well-nourished. No distress.  Neurological: alert and oriented to person, place, and time.  Skin: left foot, great toe, bone exposure at tip of toe, macerated skin around edge of wound. Yellowish drainage on bandage Psychiatric: a normal mood and affect.  behavior is normal.   Imaging on 12/2013 mri of foot FINDINGS: There is osteomyelitis of the terminal phalanx of the great toe. Effacement of normal fatty marrow signal. This probably arises from the nail bed. The other phalanges appear within normal limits. Metatarsals appear intact. Nonspecific edema is present over the forefoot. Diabetic myopathy of the plantar foot musculature. No focal abscess. Midfoot flexor tenosynovitis noted, probably reactive.  IMPRESSION: Osteomyelitis of the terminal phalanx of the great toe. No abscess.       Assessment & Plan:  Diabetic osteomyelitis of left hallux = patient currently on 2nd month of antibiotics. Given current work-up, I agree with her other medical providers that amputation maybe the best course of action. Currently her antibiotic regimen places her at risk for recurrent c.difficile. After greater than 2 months of caring for her ulcer it does not appear that she is making any significant progress. In patients with vascular disease, osteomyelitis/ulcers, very difficult to treat. I have made recommendation that she goes back to Dr. Ardelle AntonWagoner to reconsider amputation of great toe so that can hasten further antibiotics needs. Recommend to send tissue for culture if she undergoes amputation.  This has visit was with [name] and her daughter who understood my recommendations.  rtc prn

## 2014-03-19 DIAGNOSIS — T8744 Infection of amputation stump, left lower extremity: Secondary | ICD-10-CM | POA: Diagnosis not present

## 2014-03-19 DIAGNOSIS — E11621 Type 2 diabetes mellitus with foot ulcer: Secondary | ICD-10-CM | POA: Diagnosis not present

## 2014-03-19 DIAGNOSIS — L97529 Non-pressure chronic ulcer of other part of left foot with unspecified severity: Secondary | ICD-10-CM | POA: Diagnosis not present

## 2014-03-19 DIAGNOSIS — Z89412 Acquired absence of left great toe: Secondary | ICD-10-CM | POA: Diagnosis not present

## 2014-03-19 LAB — GLUCOSE, CAPILLARY
Glucose-Capillary: 104 mg/dL — ABNORMAL HIGH (ref 70–99)
Glucose-Capillary: 67 mg/dL — ABNORMAL LOW (ref 70–99)

## 2014-03-19 NOTE — Progress Notes (Signed)
Patient ID: [name], female   DOB: 05/01/1933, 80 y.o.   MRN: [id number]  Subjective: Patient returns the office today with her family discussed options for her left hallux nonhealing ulceration and chronic osteomyelitis. She states that [facility]ic [facility] therapy. She states that she sees Dr. Jimmey RalphParker who has told her that there has been improvement in the wound since starting treatment. She was most recently seen by ID and she has been on antibiotics for the last few months. She has also previously seen Dr. Darrick PennaFields with vascular surgery who discussed possible toe amputation but gave it a 50-50 chance of healing and she may require a more proximal amputation. She presents today to discuss her options.   Objective: AAO x3, NAD DP/PT pulses nonpalpable b/l. Decreased protective sensation with SWMF, decreased vibratory sensation.  Left hallux distal ulceration with palpable bone. Since my last evaluation in September, there has been partial removal of the distal phalanx. The wound is fibrogranular distally. There is no purulence identified. There is no erythema or ascending cellulitis. Some necrotic tissue around the wound edges.  On the plantar aspect of the right hallux there is a thick hyperkerotic lesion. Upon debridement, a small amount of purulence was expressed. On further debridement, there is a superficial, granular ulceration measuring approximately 0.4 x 0.4 cm without any probing, undermining, tunneling. Debridement overlying hyperkeratotic tissue purulence was expressed however it appeared to be superficial and deep pockets were noted. There is no surrounding erythema, ascending cellulitis. Nails hypertrophic, dystrophic, elongated, discolored 9. No surrounding erythema or drainage around the nail sites. No calf pain, swelling, warmth, erythema  Assessment: 80 year old female chronic osteomyelitis left hallux with nonhealing ulceration and  new ulceration right plantar hallux with localized purulence; onychomycosis  Plan: -X-rays were obtained and reviewed today -Conservative versus surgical treatment were discussed including alternatives, risks, complications. -On the left side for chronic osteomyelitis and nonhealing ulceration, the wound does not appear to be healing much. I had a long discussion with the patient and her family about the nonhealing wound and options. Again discussed that if she proceeded with the hallux amputation, it has a high likelihood of not healing and we will end up in the same situation or she may require a BKA/AKA. However, if she does not have the amputation and remains on chronic antibiotics, this puts her at risk for C. Dif, organ damage, and other complications of long term antibiotic usage. At this time, the patient states they believe the area is healing some and would like to finish this round of hyperbaric oxygen treatment. I sharply debrided the wound today. Dressing applied.  -Today, she has a new ulceration on the plantar aspect of the right hallux under a thick hyperkerotic lesion. Upon debridement, there was an area of superificial purulence identified. Upon further debridement there was no tracking, and no evidence of a deeper pocket of infection. After debridement, the wound appeared clean. Continue with daily dressing changes. Follow-up at the wound care center for this issue as well.  -Nails sharply debrided x 9 without complication. -Follow-up as needed. Call with any questions/concerns/change in symptoms. If the patient desires amputation, or if she can no longer follow-up at the wound care center to call the office. Otherwise, I will see her back in 3 months for nail debridement.

## 2014-03-20 DIAGNOSIS — E11621 Type 2 diabetes mellitus with foot ulcer: Secondary | ICD-10-CM | POA: Diagnosis not present

## 2014-03-20 DIAGNOSIS — Z89412 Acquired absence of left great toe: Secondary | ICD-10-CM | POA: Diagnosis not present

## 2014-03-20 DIAGNOSIS — L97529 Non-pressure chronic ulcer of other part of left foot with unspecified severity: Secondary | ICD-10-CM | POA: Diagnosis not present

## 2014-03-20 DIAGNOSIS — T8744 Infection of amputation stump, left lower extremity: Secondary | ICD-10-CM | POA: Diagnosis not present

## 2014-03-20 LAB — GLUCOSE, CAPILLARY
GLUCOSE-CAPILLARY: 132 mg/dL — AB (ref 70–99)
GLUCOSE-CAPILLARY: 106 mg/dL — AB (ref 70–99)

## 2014-03-21 DIAGNOSIS — T8744 Infection of amputation stump, left lower extremity: Secondary | ICD-10-CM | POA: Diagnosis not present

## 2014-03-21 DIAGNOSIS — E11621 Type 2 diabetes mellitus with foot ulcer: Secondary | ICD-10-CM | POA: Diagnosis not present

## 2014-03-21 DIAGNOSIS — Z89412 Acquired absence of left great toe: Secondary | ICD-10-CM | POA: Diagnosis not present

## 2014-03-21 DIAGNOSIS — L97529 Non-pressure chronic ulcer of other part of left foot with unspecified severity: Secondary | ICD-10-CM | POA: Diagnosis not present

## 2014-03-21 LAB — GLUCOSE, CAPILLARY: Glucose-Capillary: 105 mg/dL — ABNORMAL HIGH (ref 70–99)

## 2014-03-22 DIAGNOSIS — Z89412 Acquired absence of left great toe: Secondary | ICD-10-CM | POA: Diagnosis not present

## 2014-03-22 DIAGNOSIS — E11621 Type 2 diabetes mellitus with foot ulcer: Secondary | ICD-10-CM | POA: Diagnosis not present

## 2014-03-22 DIAGNOSIS — L97529 Non-pressure chronic ulcer of other part of left foot with unspecified severity: Secondary | ICD-10-CM | POA: Diagnosis not present

## 2014-03-22 DIAGNOSIS — T8744 Infection of amputation stump, left lower extremity: Secondary | ICD-10-CM | POA: Diagnosis not present

## 2014-03-22 LAB — GLUCOSE, CAPILLARY
Glucose-Capillary: 88 mg/dL (ref 70–99)
Glucose-Capillary: 169 mg/dL — ABNORMAL HIGH (ref 70–99)

## 2014-03-25 DIAGNOSIS — Z89412 Acquired absence of left great toe: Secondary | ICD-10-CM | POA: Diagnosis not present

## 2014-03-25 DIAGNOSIS — T8744 Infection of amputation stump, left lower extremity: Secondary | ICD-10-CM | POA: Diagnosis not present

## 2014-03-25 DIAGNOSIS — L97529 Non-pressure chronic ulcer of other part of left foot with unspecified severity: Secondary | ICD-10-CM | POA: Diagnosis not present

## 2014-03-25 DIAGNOSIS — E11621 Type 2 diabetes mellitus with foot ulcer: Secondary | ICD-10-CM | POA: Diagnosis not present

## 2014-03-25 LAB — GLUCOSE, CAPILLARY
Glucose-Capillary: 131 mg/dL — ABNORMAL HIGH (ref 70–99)
Glucose-Capillary: 118 mg/dL — ABNORMAL HIGH (ref 70–99)

## 2014-03-26 DIAGNOSIS — L97529 Non-pressure chronic ulcer of other part of left foot with unspecified severity: Secondary | ICD-10-CM | POA: Diagnosis not present

## 2014-03-26 DIAGNOSIS — T8744 Infection of amputation stump, left lower extremity: Secondary | ICD-10-CM | POA: Diagnosis not present

## 2014-03-26 DIAGNOSIS — Z89412 Acquired absence of left great toe: Secondary | ICD-10-CM | POA: Diagnosis not present

## 2014-03-26 DIAGNOSIS — E11621 Type 2 diabetes mellitus with foot ulcer: Secondary | ICD-10-CM | POA: Diagnosis not present

## 2014-03-26 LAB — GLUCOSE, CAPILLARY
Glucose-Capillary: 119 mg/dL — ABNORMAL HIGH (ref 70–99)
Glucose-Capillary: 113 mg/dL — ABNORMAL HIGH (ref 70–99)

## 2014-03-27 DIAGNOSIS — L97529 Non-pressure chronic ulcer of other part of left foot with unspecified severity: Secondary | ICD-10-CM | POA: Diagnosis not present

## 2014-03-27 DIAGNOSIS — Z89412 Acquired absence of left great toe: Secondary | ICD-10-CM | POA: Diagnosis not present

## 2014-03-27 DIAGNOSIS — T8744 Infection of amputation stump, left lower extremity: Secondary | ICD-10-CM | POA: Diagnosis not present

## 2014-03-27 DIAGNOSIS — E11621 Type 2 diabetes mellitus with foot ulcer: Secondary | ICD-10-CM | POA: Diagnosis not present

## 2014-03-27 LAB — GLUCOSE, CAPILLARY
GLUCOSE-CAPILLARY: 119 mg/dL — AB (ref 70–99)
GLUCOSE-CAPILLARY: 91 mg/dL (ref 70–99)

## 2014-03-28 DIAGNOSIS — E11621 Type 2 diabetes mellitus with foot ulcer: Secondary | ICD-10-CM | POA: Diagnosis not present

## 2014-03-28 DIAGNOSIS — L97529 Non-pressure chronic ulcer of other part of left foot with unspecified severity: Secondary | ICD-10-CM | POA: Diagnosis not present

## 2014-03-28 DIAGNOSIS — T8744 Infection of amputation stump, left lower extremity: Secondary | ICD-10-CM | POA: Diagnosis not present

## 2014-03-28 DIAGNOSIS — Z89412 Acquired absence of left great toe: Secondary | ICD-10-CM | POA: Diagnosis not present

## 2014-03-28 LAB — GLUCOSE, CAPILLARY
GLUCOSE-CAPILLARY: 115 mg/dL — AB (ref 70–99)
Glucose-Capillary: 109 mg/dL — ABNORMAL HIGH (ref 70–99)

## 2014-03-29 DIAGNOSIS — L97529 Non-pressure chronic ulcer of other part of left foot with unspecified severity: Secondary | ICD-10-CM | POA: Diagnosis not present

## 2014-03-29 DIAGNOSIS — E11621 Type 2 diabetes mellitus with foot ulcer: Secondary | ICD-10-CM | POA: Diagnosis not present

## 2014-03-29 DIAGNOSIS — Z89412 Acquired absence of left great toe: Secondary | ICD-10-CM | POA: Diagnosis not present

## 2014-03-29 DIAGNOSIS — T8744 Infection of amputation stump, left lower extremity: Secondary | ICD-10-CM | POA: Diagnosis not present

## 2014-03-29 LAB — GLUCOSE, CAPILLARY
Glucose-Capillary: 117 mg/dL — ABNORMAL HIGH (ref 70–99)
Glucose-Capillary: 111 mg/dL — ABNORMAL HIGH (ref 70–99)

## 2014-04-01 DIAGNOSIS — T8744 Infection of amputation stump, left lower extremity: Secondary | ICD-10-CM | POA: Diagnosis not present

## 2014-04-01 DIAGNOSIS — Z89412 Acquired absence of left great toe: Secondary | ICD-10-CM | POA: Diagnosis not present

## 2014-04-01 DIAGNOSIS — L97529 Non-pressure chronic ulcer of other part of left foot with unspecified severity: Secondary | ICD-10-CM | POA: Diagnosis not present

## 2014-04-01 DIAGNOSIS — E11621 Type 2 diabetes mellitus with foot ulcer: Secondary | ICD-10-CM | POA: Diagnosis not present

## 2014-04-01 LAB — GLUCOSE, CAPILLARY: Glucose-Capillary: 110 mg/dL — ABNORMAL HIGH (ref 70–99)

## 2014-04-02 DIAGNOSIS — T8744 Infection of amputation stump, left lower extremity: Secondary | ICD-10-CM | POA: Diagnosis not present

## 2014-04-02 DIAGNOSIS — L97529 Non-pressure chronic ulcer of other part of left foot with unspecified severity: Secondary | ICD-10-CM | POA: Diagnosis not present

## 2014-04-02 DIAGNOSIS — Z89412 Acquired absence of left great toe: Secondary | ICD-10-CM | POA: Diagnosis not present

## 2014-04-02 DIAGNOSIS — E11621 Type 2 diabetes mellitus with foot ulcer: Secondary | ICD-10-CM | POA: Diagnosis not present

## 2014-04-02 LAB — GLUCOSE, CAPILLARY
Glucose-Capillary: 128 mg/dL — ABNORMAL HIGH (ref 70–99)
Glucose-Capillary: 130 mg/dL — ABNORMAL HIGH (ref 70–99)

## 2014-04-03 DIAGNOSIS — Z89412 Acquired absence of left great toe: Secondary | ICD-10-CM | POA: Diagnosis not present

## 2014-04-03 DIAGNOSIS — E11621 Type 2 diabetes mellitus with foot ulcer: Secondary | ICD-10-CM | POA: Diagnosis not present

## 2014-04-03 DIAGNOSIS — T8744 Infection of amputation stump, left lower extremity: Secondary | ICD-10-CM | POA: Diagnosis not present

## 2014-04-03 DIAGNOSIS — L97529 Non-pressure chronic ulcer of other part of left foot with unspecified severity: Secondary | ICD-10-CM | POA: Diagnosis not present

## 2014-04-03 LAB — GLUCOSE, CAPILLARY
GLUCOSE-CAPILLARY: 89 mg/dL (ref 70–99)
Glucose-Capillary: 101 mg/dL — ABNORMAL HIGH (ref 70–99)

## 2014-04-08 DIAGNOSIS — Z89412 Acquired absence of left great toe: Secondary | ICD-10-CM | POA: Diagnosis not present

## 2014-04-08 DIAGNOSIS — T8744 Infection of amputation stump, left lower extremity: Secondary | ICD-10-CM | POA: Diagnosis not present

## 2014-04-08 DIAGNOSIS — N186 End stage renal disease: Secondary | ICD-10-CM | POA: Diagnosis not present

## 2014-04-08 DIAGNOSIS — E11621 Type 2 diabetes mellitus with foot ulcer: Secondary | ICD-10-CM | POA: Diagnosis not present

## 2014-04-08 DIAGNOSIS — L97529 Non-pressure chronic ulcer of other part of left foot with unspecified severity: Secondary | ICD-10-CM | POA: Diagnosis not present

## 2014-04-08 DIAGNOSIS — Z992 Dependence on renal dialysis: Secondary | ICD-10-CM | POA: Diagnosis not present

## 2014-04-08 LAB — GLUCOSE, CAPILLARY
GLUCOSE-CAPILLARY: 123 mg/dL — AB (ref 70–99)
GLUCOSE-CAPILLARY: 108 mg/dL — AB (ref 70–99)

## 2014-04-09 ENCOUNTER — Encounter (HOSPITAL_BASED_OUTPATIENT_CLINIC_OR_DEPARTMENT_OTHER): Payer: Medicare Other | Attending: General Surgery

## 2014-04-09 DIAGNOSIS — D509 Iron deficiency anemia, unspecified: Secondary | ICD-10-CM | POA: Diagnosis not present

## 2014-04-09 DIAGNOSIS — L97522 Non-pressure chronic ulcer of other part of left foot with fat layer exposed: Secondary | ICD-10-CM | POA: Diagnosis not present

## 2014-04-09 DIAGNOSIS — N186 End stage renal disease: Secondary | ICD-10-CM | POA: Diagnosis not present

## 2014-04-09 DIAGNOSIS — D631 Anemia in chronic kidney disease: Secondary | ICD-10-CM | POA: Diagnosis not present

## 2014-04-09 DIAGNOSIS — N2581 Secondary hyperparathyroidism of renal origin: Secondary | ICD-10-CM | POA: Diagnosis not present

## 2014-04-09 DIAGNOSIS — E1129 Type 2 diabetes mellitus with other diabetic kidney complication: Secondary | ICD-10-CM | POA: Diagnosis not present

## 2014-04-09 DIAGNOSIS — E11621 Type 2 diabetes mellitus with foot ulcer: Secondary | ICD-10-CM | POA: Insufficient documentation

## 2014-04-09 LAB — GLUCOSE, CAPILLARY
Glucose-Capillary: 120 mg/dL — ABNORMAL HIGH (ref 70–99)
Glucose-Capillary: 145 mg/dL — ABNORMAL HIGH (ref 70–99)
Glucose-Capillary: 169 mg/dL — ABNORMAL HIGH (ref 70–99)

## 2014-04-10 DIAGNOSIS — E11621 Type 2 diabetes mellitus with foot ulcer: Secondary | ICD-10-CM | POA: Diagnosis not present

## 2014-04-10 DIAGNOSIS — L97522 Non-pressure chronic ulcer of other part of left foot with fat layer exposed: Secondary | ICD-10-CM | POA: Diagnosis not present

## 2014-04-10 DIAGNOSIS — S91102A Unspecified open wound of left great toe without damage to nail, initial encounter: Secondary | ICD-10-CM | POA: Diagnosis not present

## 2014-04-10 LAB — GLUCOSE, CAPILLARY
Glucose-Capillary: 100 mg/dL — ABNORMAL HIGH (ref 70–99)
Glucose-Capillary: 122 mg/dL — ABNORMAL HIGH (ref 70–99)

## 2014-04-11 DIAGNOSIS — E11621 Type 2 diabetes mellitus with foot ulcer: Secondary | ICD-10-CM | POA: Diagnosis not present

## 2014-04-11 DIAGNOSIS — L97522 Non-pressure chronic ulcer of other part of left foot with fat layer exposed: Secondary | ICD-10-CM | POA: Diagnosis not present

## 2014-04-11 LAB — GLUCOSE, CAPILLARY
Glucose-Capillary: 101 mg/dL — ABNORMAL HIGH (ref 70–99)
Glucose-Capillary: 122 mg/dL — ABNORMAL HIGH (ref 70–99)

## 2014-04-12 DIAGNOSIS — L97522 Non-pressure chronic ulcer of other part of left foot with fat layer exposed: Secondary | ICD-10-CM | POA: Diagnosis not present

## 2014-04-12 DIAGNOSIS — E11621 Type 2 diabetes mellitus with foot ulcer: Secondary | ICD-10-CM | POA: Diagnosis not present

## 2014-04-12 LAB — GLUCOSE, CAPILLARY
Glucose-Capillary: 115 mg/dL — ABNORMAL HIGH (ref 70–99)
Glucose-Capillary: 119 mg/dL — ABNORMAL HIGH (ref 70–99)

## 2014-04-15 DIAGNOSIS — E11621 Type 2 diabetes mellitus with foot ulcer: Secondary | ICD-10-CM | POA: Diagnosis not present

## 2014-04-15 DIAGNOSIS — L97522 Non-pressure chronic ulcer of other part of left foot with fat layer exposed: Secondary | ICD-10-CM | POA: Diagnosis not present

## 2014-04-15 LAB — GLUCOSE, CAPILLARY
GLUCOSE-CAPILLARY: 110 mg/dL — AB (ref 70–99)
Glucose-Capillary: 168 mg/dL — ABNORMAL HIGH (ref 70–99)

## 2014-04-16 DIAGNOSIS — L97522 Non-pressure chronic ulcer of other part of left foot with fat layer exposed: Secondary | ICD-10-CM | POA: Diagnosis not present

## 2014-04-16 DIAGNOSIS — E11621 Type 2 diabetes mellitus with foot ulcer: Secondary | ICD-10-CM | POA: Diagnosis not present

## 2014-04-16 LAB — GLUCOSE, CAPILLARY
GLUCOSE-CAPILLARY: 95 mg/dL (ref 70–99)
Glucose-Capillary: 110 mg/dL — ABNORMAL HIGH (ref 70–99)

## 2014-04-17 DIAGNOSIS — E11621 Type 2 diabetes mellitus with foot ulcer: Secondary | ICD-10-CM | POA: Diagnosis not present

## 2014-04-17 DIAGNOSIS — L97522 Non-pressure chronic ulcer of other part of left foot with fat layer exposed: Secondary | ICD-10-CM | POA: Diagnosis not present

## 2014-04-17 LAB — GLUCOSE, CAPILLARY
GLUCOSE-CAPILLARY: 82 mg/dL (ref 70–99)
GLUCOSE-CAPILLARY: 142 mg/dL — AB (ref 70–99)

## 2014-04-18 DIAGNOSIS — L97522 Non-pressure chronic ulcer of other part of left foot with fat layer exposed: Secondary | ICD-10-CM | POA: Diagnosis not present

## 2014-04-18 DIAGNOSIS — E11621 Type 2 diabetes mellitus with foot ulcer: Secondary | ICD-10-CM | POA: Diagnosis not present

## 2014-04-18 LAB — GLUCOSE, CAPILLARY
Glucose-Capillary: 114 mg/dL — ABNORMAL HIGH (ref 70–99)
Glucose-Capillary: 105 mg/dL — ABNORMAL HIGH (ref 70–99)

## 2014-04-18 NOTE — Progress Notes (Signed)
[facility]  NAME:  Stephan MinisterGRAVES, Selyna                ACCOUNT NO.:  [id number]  MEDICAL RECORD NO.:  [id number]      DATE OF BIRTH:  Aug 08, 1932  PHYSICIAN:  Ardath SaxPeter Brittannie Tawney, M.D.      VISIT DATE:  04/17/2014                                  OFFICE VISIT   Stacy GardnerBetty Heeren is a 80 year old African American lady who has long- standing type 2 diabetes, and has a real problem with the distal phalanx of her left great toe, which she developed a diabetic foot ulcer Wagner 3.  Since that time, she has had the distal phalanx excised and multiple debridements of the soft tissue and she has been on long-standing antibiotics.  She has also had 1 session a day for a total of 30 treatments of hyperbaric oxygen.  She has improved greatly with the hyperbaric oxygen and antibiotics, and I would like to give her another 30 treatments in order to save this foot.  I feel if we lose the toe because of her diabetes and end-arterial disease, I think it would probably end up being a transmetatarsal and that probably would not heal and she would end up with a below-knee amputation.  So, I think at this point, with the great improvement we have on the toe itself, I would like to continue the hyperbaric oxygen so that perhaps we could forego any further surgery.     Ardath SaxPeter Shaquasia Caponigro, M.D.     PP/MEDQ  D:  04/17/2014  T:  04/18/2014  Job:  [id number]

## 2014-04-22 DIAGNOSIS — L97522 Non-pressure chronic ulcer of other part of left foot with fat layer exposed: Secondary | ICD-10-CM | POA: Diagnosis not present

## 2014-04-22 DIAGNOSIS — E11621 Type 2 diabetes mellitus with foot ulcer: Secondary | ICD-10-CM | POA: Diagnosis not present

## 2014-04-22 LAB — GLUCOSE, CAPILLARY: Glucose-Capillary: 117 mg/dL — ABNORMAL HIGH (ref 70–99)

## 2014-04-23 DIAGNOSIS — L97522 Non-pressure chronic ulcer of other part of left foot with fat layer exposed: Secondary | ICD-10-CM | POA: Diagnosis not present

## 2014-04-23 DIAGNOSIS — E11621 Type 2 diabetes mellitus with foot ulcer: Secondary | ICD-10-CM | POA: Diagnosis not present

## 2014-04-23 LAB — GLUCOSE, CAPILLARY: Glucose-Capillary: 100 mg/dL — ABNORMAL HIGH (ref 70–99)

## 2014-04-24 ENCOUNTER — Ambulatory Visit: Payer: Medicare Other | Admitting: Neurology

## 2014-04-24 DIAGNOSIS — L97522 Non-pressure chronic ulcer of other part of left foot with fat layer exposed: Secondary | ICD-10-CM | POA: Diagnosis not present

## 2014-04-24 DIAGNOSIS — E11621 Type 2 diabetes mellitus with foot ulcer: Secondary | ICD-10-CM | POA: Diagnosis not present

## 2014-04-24 LAB — GLUCOSE, CAPILLARY
GLUCOSE-CAPILLARY: 112 mg/dL — AB (ref 70–99)
Glucose-Capillary: 100 mg/dL — ABNORMAL HIGH (ref 70–99)

## 2014-04-25 DIAGNOSIS — L97522 Non-pressure chronic ulcer of other part of left foot with fat layer exposed: Secondary | ICD-10-CM | POA: Diagnosis not present

## 2014-04-25 DIAGNOSIS — E11621 Type 2 diabetes mellitus with foot ulcer: Secondary | ICD-10-CM | POA: Diagnosis not present

## 2014-04-25 LAB — GLUCOSE, CAPILLARY
GLUCOSE-CAPILLARY: 138 mg/dL — AB (ref 70–99)
Glucose-Capillary: 139 mg/dL — ABNORMAL HIGH (ref 70–99)

## 2014-04-26 ENCOUNTER — Encounter: Payer: Self-pay | Admitting: Neurology

## 2014-04-26 DIAGNOSIS — E11621 Type 2 diabetes mellitus with foot ulcer: Secondary | ICD-10-CM | POA: Diagnosis not present

## 2014-04-26 DIAGNOSIS — K921 Melena: Secondary | ICD-10-CM | POA: Diagnosis not present

## 2014-04-26 DIAGNOSIS — E1122 Type 2 diabetes mellitus with diabetic chronic kidney disease: Secondary | ICD-10-CM | POA: Diagnosis not present

## 2014-04-26 DIAGNOSIS — N186 End stage renal disease: Secondary | ICD-10-CM | POA: Diagnosis not present

## 2014-04-26 DIAGNOSIS — L97522 Non-pressure chronic ulcer of other part of left foot with fat layer exposed: Secondary | ICD-10-CM | POA: Diagnosis not present

## 2014-04-26 DIAGNOSIS — R05 Cough: Secondary | ICD-10-CM | POA: Diagnosis not present

## 2014-04-26 DIAGNOSIS — R0602 Shortness of breath: Secondary | ICD-10-CM | POA: Diagnosis not present

## 2014-04-26 DIAGNOSIS — I5043 Acute on chronic combined systolic (congestive) and diastolic (congestive) heart failure: Secondary | ICD-10-CM | POA: Diagnosis not present

## 2014-04-26 DIAGNOSIS — D509 Iron deficiency anemia, unspecified: Secondary | ICD-10-CM | POA: Diagnosis not present

## 2014-04-26 DIAGNOSIS — Z79899 Other long term (current) drug therapy: Secondary | ICD-10-CM | POA: Diagnosis not present

## 2014-04-26 LAB — GLUCOSE, CAPILLARY
GLUCOSE-CAPILLARY: 104 mg/dL — AB (ref 70–99)
GLUCOSE-CAPILLARY: 104 mg/dL — AB (ref 70–99)

## 2014-04-29 ENCOUNTER — Other Ambulatory Visit: Payer: Self-pay | Admitting: Family Medicine

## 2014-04-29 ENCOUNTER — Ambulatory Visit
Admission: RE | Admit: 2014-04-29 | Discharge: 2014-04-29 | Disposition: A | Discharge status: Home / Self Care | Payer: Medicare Other | Source: Ambulatory Visit | Attending: Family Medicine | Admitting: Family Medicine

## 2014-04-29 DIAGNOSIS — E11621 Type 2 diabetes mellitus with foot ulcer: Secondary | ICD-10-CM | POA: Diagnosis not present

## 2014-04-29 DIAGNOSIS — Z87891 Personal history of nicotine dependence: Secondary | ICD-10-CM | POA: Diagnosis not present

## 2014-04-29 DIAGNOSIS — R0602 Shortness of breath: Secondary | ICD-10-CM

## 2014-04-29 DIAGNOSIS — R05 Cough: Secondary | ICD-10-CM | POA: Diagnosis not present

## 2014-04-29 DIAGNOSIS — L97522 Non-pressure chronic ulcer of other part of left foot with fat layer exposed: Secondary | ICD-10-CM | POA: Diagnosis not present

## 2014-04-29 LAB — GLUCOSE, CAPILLARY
Glucose-Capillary: 203 mg/dL — ABNORMAL HIGH (ref 70–99)
Glucose-Capillary: 187 mg/dL — ABNORMAL HIGH (ref 70–99)

## 2014-04-30 DIAGNOSIS — L97522 Non-pressure chronic ulcer of other part of left foot with fat layer exposed: Secondary | ICD-10-CM | POA: Diagnosis not present

## 2014-04-30 DIAGNOSIS — K921 Melena: Secondary | ICD-10-CM | POA: Diagnosis not present

## 2014-04-30 DIAGNOSIS — E1122 Type 2 diabetes mellitus with diabetic chronic kidney disease: Secondary | ICD-10-CM | POA: Diagnosis not present

## 2014-04-30 DIAGNOSIS — E11621 Type 2 diabetes mellitus with foot ulcer: Secondary | ICD-10-CM | POA: Diagnosis not present

## 2014-04-30 LAB — GLUCOSE, CAPILLARY
GLUCOSE-CAPILLARY: 128 mg/dL — AB (ref 70–99)
Glucose-Capillary: 108 mg/dL — ABNORMAL HIGH (ref 70–99)

## 2014-05-01 DIAGNOSIS — E11621 Type 2 diabetes mellitus with foot ulcer: Secondary | ICD-10-CM | POA: Diagnosis not present

## 2014-05-01 DIAGNOSIS — L97522 Non-pressure chronic ulcer of other part of left foot with fat layer exposed: Secondary | ICD-10-CM | POA: Diagnosis not present

## 2014-05-01 LAB — GLUCOSE, CAPILLARY
Glucose-Capillary: 113 mg/dL — ABNORMAL HIGH (ref 70–99)
Glucose-Capillary: 106 mg/dL — ABNORMAL HIGH (ref 70–99)

## 2014-05-02 ENCOUNTER — Telehealth: Payer: Self-pay | Admitting: Internal Medicine

## 2014-05-02 NOTE — Telephone Encounter (Signed)
LEFT MESSAGE FOR PATIENT TO RETURN CALL TO SCHEDULE NP APPT.  °

## 2014-05-06 DIAGNOSIS — L97522 Non-pressure chronic ulcer of other part of left foot with fat layer exposed: Secondary | ICD-10-CM | POA: Diagnosis not present

## 2014-05-06 DIAGNOSIS — E11621 Type 2 diabetes mellitus with foot ulcer: Secondary | ICD-10-CM | POA: Diagnosis not present

## 2014-05-06 LAB — GLUCOSE, CAPILLARY
GLUCOSE-CAPILLARY: 138 mg/dL — AB (ref 70–99)
Glucose-Capillary: 121 mg/dL — ABNORMAL HIGH (ref 70–99)

## 2014-05-07 DIAGNOSIS — L97522 Non-pressure chronic ulcer of other part of left foot with fat layer exposed: Secondary | ICD-10-CM | POA: Diagnosis not present

## 2014-05-07 DIAGNOSIS — E11621 Type 2 diabetes mellitus with foot ulcer: Secondary | ICD-10-CM | POA: Diagnosis not present

## 2014-05-07 LAB — GLUCOSE, CAPILLARY
Glucose-Capillary: 120 mg/dL — ABNORMAL HIGH (ref 70–99)
Glucose-Capillary: 128 mg/dL — ABNORMAL HIGH (ref 70–99)

## 2014-05-08 DIAGNOSIS — L97522 Non-pressure chronic ulcer of other part of left foot with fat layer exposed: Secondary | ICD-10-CM | POA: Diagnosis not present

## 2014-05-08 DIAGNOSIS — E11621 Type 2 diabetes mellitus with foot ulcer: Secondary | ICD-10-CM | POA: Diagnosis not present

## 2014-05-08 LAB — GLUCOSE, CAPILLARY
GLUCOSE-CAPILLARY: 124 mg/dL — AB (ref 70–99)
Glucose-Capillary: 146 mg/dL — ABNORMAL HIGH (ref 70–99)

## 2014-05-09 ENCOUNTER — Telehealth: Payer: Self-pay | Admitting: Internal Medicine

## 2014-05-09 DIAGNOSIS — Z992 Dependence on renal dialysis: Secondary | ICD-10-CM | POA: Diagnosis not present

## 2014-05-09 DIAGNOSIS — E11621 Type 2 diabetes mellitus with foot ulcer: Secondary | ICD-10-CM | POA: Diagnosis not present

## 2014-05-09 DIAGNOSIS — N186 End stage renal disease: Secondary | ICD-10-CM | POA: Diagnosis not present

## 2014-05-09 DIAGNOSIS — L97522 Non-pressure chronic ulcer of other part of left foot with fat layer exposed: Secondary | ICD-10-CM | POA: Diagnosis not present

## 2014-05-09 LAB — GLUCOSE, CAPILLARY
Glucose-Capillary: 94 mg/dL (ref 70–99)
Glucose-Capillary: 149 mg/dL — ABNORMAL HIGH (ref 70–99)
Glucose-Capillary: 102 mg/dL — ABNORMAL HIGH (ref 70–99)

## 2014-05-09 NOTE — Telephone Encounter (Signed)
S/W PATIENT NEICE AND GAVE NP APPT FOR 01/25 @ 1:45 W/DR. MOHAMED REFERRING DR. SHARON WIOLTERS DX- ANEMIA; WEIGHT LOSS

## 2014-05-12 DIAGNOSIS — N2581 Secondary hyperparathyroidism of renal origin: Secondary | ICD-10-CM | POA: Diagnosis not present

## 2014-05-12 DIAGNOSIS — N186 End stage renal disease: Secondary | ICD-10-CM | POA: Diagnosis not present

## 2014-05-12 DIAGNOSIS — E1129 Type 2 diabetes mellitus with other diabetic kidney complication: Secondary | ICD-10-CM | POA: Diagnosis not present

## 2014-05-13 ENCOUNTER — Encounter (HOSPITAL_BASED_OUTPATIENT_CLINIC_OR_DEPARTMENT_OTHER): Payer: Medicare Other | Attending: General Surgery

## 2014-05-13 DIAGNOSIS — E11621 Type 2 diabetes mellitus with foot ulcer: Secondary | ICD-10-CM | POA: Diagnosis present

## 2014-05-13 DIAGNOSIS — L97524 Non-pressure chronic ulcer of other part of left foot with necrosis of bone: Secondary | ICD-10-CM | POA: Insufficient documentation

## 2014-05-14 DIAGNOSIS — E1129 Type 2 diabetes mellitus with other diabetic kidney complication: Secondary | ICD-10-CM | POA: Diagnosis not present

## 2014-05-14 DIAGNOSIS — N186 End stage renal disease: Secondary | ICD-10-CM | POA: Diagnosis not present

## 2014-05-14 DIAGNOSIS — N2581 Secondary hyperparathyroidism of renal origin: Secondary | ICD-10-CM | POA: Diagnosis not present

## 2014-05-14 DIAGNOSIS — L97524 Non-pressure chronic ulcer of other part of left foot with necrosis of bone: Secondary | ICD-10-CM | POA: Diagnosis not present

## 2014-05-14 DIAGNOSIS — E11621 Type 2 diabetes mellitus with foot ulcer: Secondary | ICD-10-CM | POA: Diagnosis not present

## 2014-05-14 LAB — GLUCOSE, CAPILLARY
GLUCOSE-CAPILLARY: 112 mg/dL — AB (ref 70–99)
Glucose-Capillary: 115 mg/dL — ABNORMAL HIGH (ref 70–99)

## 2014-05-15 DIAGNOSIS — E11621 Type 2 diabetes mellitus with foot ulcer: Secondary | ICD-10-CM | POA: Diagnosis not present

## 2014-05-15 DIAGNOSIS — L97524 Non-pressure chronic ulcer of other part of left foot with necrosis of bone: Secondary | ICD-10-CM | POA: Diagnosis not present

## 2014-05-15 LAB — GLUCOSE, CAPILLARY
Glucose-Capillary: 96 mg/dL (ref 70–99)
Glucose-Capillary: 100 mg/dL — ABNORMAL HIGH (ref 70–99)

## 2014-05-16 DIAGNOSIS — E11621 Type 2 diabetes mellitus with foot ulcer: Secondary | ICD-10-CM | POA: Diagnosis not present

## 2014-05-16 DIAGNOSIS — E1129 Type 2 diabetes mellitus with other diabetic kidney complication: Secondary | ICD-10-CM | POA: Diagnosis not present

## 2014-05-16 DIAGNOSIS — N186 End stage renal disease: Secondary | ICD-10-CM | POA: Diagnosis not present

## 2014-05-16 DIAGNOSIS — N2581 Secondary hyperparathyroidism of renal origin: Secondary | ICD-10-CM | POA: Diagnosis not present

## 2014-05-16 DIAGNOSIS — L97524 Non-pressure chronic ulcer of other part of left foot with necrosis of bone: Secondary | ICD-10-CM | POA: Diagnosis not present

## 2014-05-16 LAB — GLUCOSE, CAPILLARY
Glucose-Capillary: 150 mg/dL — ABNORMAL HIGH (ref 70–99)
Glucose-Capillary: 93 mg/dL (ref 70–99)
Glucose-Capillary: 140 mg/dL — ABNORMAL HIGH (ref 70–99)
Glucose-Capillary: 104 mg/dL — ABNORMAL HIGH (ref 70–99)

## 2014-05-17 DIAGNOSIS — E11621 Type 2 diabetes mellitus with foot ulcer: Secondary | ICD-10-CM | POA: Diagnosis not present

## 2014-05-17 DIAGNOSIS — L97524 Non-pressure chronic ulcer of other part of left foot with necrosis of bone: Secondary | ICD-10-CM | POA: Diagnosis not present

## 2014-05-17 LAB — GLUCOSE, CAPILLARY
Glucose-Capillary: 136 mg/dL — ABNORMAL HIGH (ref 70–99)
Glucose-Capillary: 119 mg/dL — ABNORMAL HIGH (ref 70–99)

## 2014-05-18 DIAGNOSIS — E1129 Type 2 diabetes mellitus with other diabetic kidney complication: Secondary | ICD-10-CM | POA: Diagnosis not present

## 2014-05-18 DIAGNOSIS — N186 End stage renal disease: Secondary | ICD-10-CM | POA: Diagnosis not present

## 2014-05-18 DIAGNOSIS — N2581 Secondary hyperparathyroidism of renal origin: Secondary | ICD-10-CM | POA: Diagnosis not present

## 2014-05-20 DIAGNOSIS — L97524 Non-pressure chronic ulcer of other part of left foot with necrosis of bone: Secondary | ICD-10-CM | POA: Diagnosis not present

## 2014-05-20 DIAGNOSIS — E11621 Type 2 diabetes mellitus with foot ulcer: Secondary | ICD-10-CM | POA: Diagnosis not present

## 2014-05-20 LAB — GLUCOSE, CAPILLARY
GLUCOSE-CAPILLARY: 105 mg/dL — AB (ref 70–99)
Glucose-Capillary: 129 mg/dL — ABNORMAL HIGH (ref 70–99)
Glucose-Capillary: 138 mg/dL — ABNORMAL HIGH (ref 70–99)

## 2014-05-21 DIAGNOSIS — E1129 Type 2 diabetes mellitus with other diabetic kidney complication: Secondary | ICD-10-CM | POA: Diagnosis not present

## 2014-05-21 DIAGNOSIS — N186 End stage renal disease: Secondary | ICD-10-CM | POA: Diagnosis not present

## 2014-05-21 DIAGNOSIS — N2581 Secondary hyperparathyroidism of renal origin: Secondary | ICD-10-CM | POA: Diagnosis not present

## 2014-05-21 DIAGNOSIS — E11621 Type 2 diabetes mellitus with foot ulcer: Secondary | ICD-10-CM | POA: Diagnosis not present

## 2014-05-21 DIAGNOSIS — L97524 Non-pressure chronic ulcer of other part of left foot with necrosis of bone: Secondary | ICD-10-CM | POA: Diagnosis not present

## 2014-05-21 LAB — GLUCOSE, CAPILLARY
GLUCOSE-CAPILLARY: 94 mg/dL (ref 70–99)
Glucose-Capillary: 98 mg/dL (ref 70–99)

## 2014-05-22 DIAGNOSIS — E11621 Type 2 diabetes mellitus with foot ulcer: Secondary | ICD-10-CM | POA: Diagnosis not present

## 2014-05-22 DIAGNOSIS — L97524 Non-pressure chronic ulcer of other part of left foot with necrosis of bone: Secondary | ICD-10-CM | POA: Diagnosis not present

## 2014-05-22 LAB — GLUCOSE, CAPILLARY
GLUCOSE-CAPILLARY: 98 mg/dL (ref 70–99)
Glucose-Capillary: 95 mg/dL (ref 70–99)

## 2014-05-23 DIAGNOSIS — E11621 Type 2 diabetes mellitus with foot ulcer: Secondary | ICD-10-CM | POA: Diagnosis not present

## 2014-05-23 DIAGNOSIS — E1129 Type 2 diabetes mellitus with other diabetic kidney complication: Secondary | ICD-10-CM | POA: Diagnosis not present

## 2014-05-23 DIAGNOSIS — N2581 Secondary hyperparathyroidism of renal origin: Secondary | ICD-10-CM | POA: Diagnosis not present

## 2014-05-23 DIAGNOSIS — N186 End stage renal disease: Secondary | ICD-10-CM | POA: Diagnosis not present

## 2014-05-23 DIAGNOSIS — L97524 Non-pressure chronic ulcer of other part of left foot with necrosis of bone: Secondary | ICD-10-CM | POA: Diagnosis not present

## 2014-05-23 LAB — GLUCOSE, CAPILLARY
GLUCOSE-CAPILLARY: 130 mg/dL — AB (ref 70–99)
Glucose-Capillary: 103 mg/dL — ABNORMAL HIGH (ref 70–99)

## 2014-05-24 DIAGNOSIS — E11621 Type 2 diabetes mellitus with foot ulcer: Secondary | ICD-10-CM | POA: Diagnosis not present

## 2014-05-24 DIAGNOSIS — L97524 Non-pressure chronic ulcer of other part of left foot with necrosis of bone: Secondary | ICD-10-CM | POA: Diagnosis not present

## 2014-05-24 LAB — GLUCOSE, CAPILLARY
GLUCOSE-CAPILLARY: 115 mg/dL — AB (ref 70–99)
Glucose-Capillary: 100 mg/dL — ABNORMAL HIGH (ref 70–99)

## 2014-05-25 DIAGNOSIS — E1129 Type 2 diabetes mellitus with other diabetic kidney complication: Secondary | ICD-10-CM | POA: Diagnosis not present

## 2014-05-25 DIAGNOSIS — N186 End stage renal disease: Secondary | ICD-10-CM | POA: Diagnosis not present

## 2014-05-25 DIAGNOSIS — N2581 Secondary hyperparathyroidism of renal origin: Secondary | ICD-10-CM | POA: Diagnosis not present

## 2014-05-27 DIAGNOSIS — E11621 Type 2 diabetes mellitus with foot ulcer: Secondary | ICD-10-CM | POA: Diagnosis not present

## 2014-05-27 DIAGNOSIS — L97524 Non-pressure chronic ulcer of other part of left foot with necrosis of bone: Secondary | ICD-10-CM | POA: Diagnosis not present

## 2014-05-27 LAB — GLUCOSE, CAPILLARY
Glucose-Capillary: 119 mg/dL — ABNORMAL HIGH (ref 70–99)
Glucose-Capillary: 90 mg/dL (ref 70–99)
Glucose-Capillary: 133 mg/dL — ABNORMAL HIGH (ref 70–99)

## 2014-05-28 DIAGNOSIS — N186 End stage renal disease: Secondary | ICD-10-CM | POA: Diagnosis not present

## 2014-05-28 DIAGNOSIS — L97524 Non-pressure chronic ulcer of other part of left foot with necrosis of bone: Secondary | ICD-10-CM | POA: Diagnosis not present

## 2014-05-28 DIAGNOSIS — N2581 Secondary hyperparathyroidism of renal origin: Secondary | ICD-10-CM | POA: Diagnosis not present

## 2014-05-28 DIAGNOSIS — E1129 Type 2 diabetes mellitus with other diabetic kidney complication: Secondary | ICD-10-CM | POA: Diagnosis not present

## 2014-05-28 DIAGNOSIS — E11621 Type 2 diabetes mellitus with foot ulcer: Secondary | ICD-10-CM | POA: Diagnosis not present

## 2014-05-28 LAB — GLUCOSE, CAPILLARY
Glucose-Capillary: 99 mg/dL (ref 70–99)
Glucose-Capillary: 92 mg/dL (ref 70–99)

## 2014-05-29 DIAGNOSIS — L97524 Non-pressure chronic ulcer of other part of left foot with necrosis of bone: Secondary | ICD-10-CM | POA: Diagnosis not present

## 2014-05-29 DIAGNOSIS — E11621 Type 2 diabetes mellitus with foot ulcer: Secondary | ICD-10-CM | POA: Diagnosis not present

## 2014-05-29 LAB — GLUCOSE, CAPILLARY
Glucose-Capillary: 91 mg/dL (ref 70–99)
Glucose-Capillary: 94 mg/dL (ref 70–99)

## 2014-05-30 ENCOUNTER — Ambulatory Visit: Payer: Medicare Other | Admitting: Cardiovascular Disease

## 2014-05-30 DIAGNOSIS — E11621 Type 2 diabetes mellitus with foot ulcer: Secondary | ICD-10-CM | POA: Diagnosis not present

## 2014-05-30 DIAGNOSIS — L97524 Non-pressure chronic ulcer of other part of left foot with necrosis of bone: Secondary | ICD-10-CM | POA: Diagnosis not present

## 2014-05-30 DIAGNOSIS — N186 End stage renal disease: Secondary | ICD-10-CM | POA: Diagnosis not present

## 2014-05-30 DIAGNOSIS — E1129 Type 2 diabetes mellitus with other diabetic kidney complication: Secondary | ICD-10-CM | POA: Diagnosis not present

## 2014-05-30 DIAGNOSIS — N2581 Secondary hyperparathyroidism of renal origin: Secondary | ICD-10-CM | POA: Diagnosis not present

## 2014-05-30 LAB — GLUCOSE, CAPILLARY
Glucose-Capillary: 95 mg/dL (ref 70–99)
Glucose-Capillary: 97 mg/dL (ref 70–99)

## 2014-06-03 ENCOUNTER — Ambulatory Visit: Payer: Medicare Other

## 2014-06-03 ENCOUNTER — Encounter: Payer: Self-pay | Admitting: Internal Medicine

## 2014-06-03 ENCOUNTER — Other Ambulatory Visit (HOSPITAL_BASED_OUTPATIENT_CLINIC_OR_DEPARTMENT_OTHER): Payer: Medicare Other

## 2014-06-03 ENCOUNTER — Ambulatory Visit (HOSPITAL_BASED_OUTPATIENT_CLINIC_OR_DEPARTMENT_OTHER): Payer: Medicare Other | Admitting: Internal Medicine

## 2014-06-03 ENCOUNTER — Other Ambulatory Visit: Payer: Self-pay | Admitting: Medical Oncology

## 2014-06-03 ENCOUNTER — Other Ambulatory Visit: Payer: Self-pay | Admitting: Internal Medicine

## 2014-06-03 VITALS — BP 150/67 | HR 81 | Temp 97.8°F | Resp 18 | Ht 63.0 in | Wt 184.5 lb

## 2014-06-03 DIAGNOSIS — D631 Anemia in chronic kidney disease: Secondary | ICD-10-CM

## 2014-06-03 DIAGNOSIS — R197 Diarrhea, unspecified: Secondary | ICD-10-CM | POA: Diagnosis not present

## 2014-06-03 DIAGNOSIS — D509 Iron deficiency anemia, unspecified: Secondary | ICD-10-CM

## 2014-06-03 DIAGNOSIS — D649 Anemia, unspecified: Secondary | ICD-10-CM

## 2014-06-03 DIAGNOSIS — N186 End stage renal disease: Secondary | ICD-10-CM

## 2014-06-03 LAB — COMPREHENSIVE METABOLIC PANEL (CC13)
ALK PHOS: 104 U/L (ref 40–150)
ALT: 7 U/L (ref 0–55)
AST: 10 U/L (ref 5–34)
Albumin: 3.2 g/dL — ABNORMAL LOW (ref 3.5–5.0)
Anion Gap: 19 mEq/L — ABNORMAL HIGH (ref 3–11)
BUN: 75 mg/dL — ABNORMAL HIGH (ref 7.0–26.0)
CHLORIDE: 99 meq/L (ref 98–109)
CO2: 25 meq/L (ref 22–29)
Calcium: 9.5 mg/dL (ref 8.4–10.4)
Creatinine: 12.5 mg/dL (ref 0.6–1.1)
EGFR: 3 mL/min/{1.73_m2} — ABNORMAL LOW (ref >90)
Glucose: 107 mg/dl (ref 70–140)
Potassium: 5.3 mEq/L — ABNORMAL HIGH (ref 3.5–5.1)
Sodium: 142 mEq/L (ref 136–145)
Total Bilirubin: 0.41 mg/dL (ref 0.20–1.20)
Total Protein: 7 g/dL (ref 6.4–8.3)

## 2014-06-03 LAB — CBC WITH DIFFERENTIAL/PLATELET
BASO%: 1.3 % (ref 0.0–2.0)
Basophils Absolute: 0.1 10*3/uL (ref 0.0–0.1)
EOS%: 1.4 % (ref 0.0–7.0)
Eosinophils Absolute: 0.1 10*3/uL (ref 0.0–0.5)
HCT: 43 % (ref 34.8–46.6)
HEMOGLOBIN: 12.8 g/dL (ref 11.6–15.9)
LYMPH%: 17.1 % (ref 14.0–49.7)
MCH: 22.9 pg — AB (ref 25.1–34.0)
MCHC: 29.8 g/dL — ABNORMAL LOW (ref 31.5–36.0)
MCV: 76.6 fL — ABNORMAL LOW (ref 79.5–101.0)
MONO#: 0.7 10*3/uL (ref 0.1–0.9)
MONO%: 7.8 % (ref 0.0–14.0)
NEUT%: 72.4 % (ref 38.4–76.8)
NEUTROS ABS: 6.6 10*3/uL — AB (ref 1.5–6.5)
Platelets: 350 10*3/uL (ref 145–400)
RBC: 5.61 10*6/uL — AB (ref 3.70–5.45)
RDW: 19.8 % — AB (ref 11.2–14.5)
WBC: 9.1 10*3/uL (ref 3.9–10.3)
lymph#: 1.6 10*3/uL (ref 0.9–3.3)

## 2014-06-03 NOTE — Progress Notes (Signed)
[facility] [phone]   [phone]  CONSULT NOTE  REFERRING PHYSICIAN: Dr. Mila Palmer  REASON FOR CONSULTATION:  81 years old African-American female with persistent anemia and recent weight loss.  HPI [name] is a 81 y.o. female with multiple medical problems including end-stage renal disease and currently on dialysis on Saturday, Tuesday and Thursday weekly in addition to history of diabetes mellitus, hyperparathyroidism, depression, osteoarthritis, gout, diabetic nephropathy, GERD as well as peripheral vascular disease and stroke. The patient was seen by her primary care physician recently and noted to have persistent anemia and gradual weight loss. She was referred to the clinic today for evaluation and recommendation regarding her anemia. The patient is followed by nephrology closely for her dialysis which is adjusted routinely for her weight. She denied having any significant complaints today except for frequent episodes of diarrhea. She also has occasional rectal bleeding. She has not seen a gastroenterologist for a while. She is not currently on any oral iron supplements but she occasionally will receive intravenous iron infusion during dialysis if her levels are low. She also was to receive Aranesp injection on as-needed basis but not recently. When seen today she denied having any complaints except for mild fatigue. She denied having any chest pain, shortness breath, cough or hemoptysis. The patient denied having any significant nausea or vomiting. She denied having any headache or visual changes. Family history significant for mother with a stroke, father had lung cancer and brother had cancer. The patient is single and has no children. She was accompanied today by her niece [name] and her adopted son, [name]. She used to work in [employer]. The patient has a remote history of smoking as well as alcohol abuse but not recently and no history  of drug abuse.  HPI  Past Medical History  Diagnosis Date  . Diabetes mellitus   . Anemia   . Hyperparathyroidism   . Depression   . Obesity   . Osteoarthritis   . MRSA (methicillin resistant staph aureus) culture positive   . Gout   . C. difficile diarrhea   . Chronic kidney disease     on Dialysis  . Diabetic neuropathy   . GERD (gastroesophageal reflux disease)   . Stroke   . Peripheral arterial disease     Gangrene-Left Great Toe    Past Surgical History  Procedure Laterality Date  . Breast biopsy Right   . Shuntogram Right 09/06/12    Thigh graft  . Sp declot avgg Right Sep 14, 2012    Right thigh graft    Family History  Problem Relation Age of Onset  . Cancer Father     type unknown  . Diabetes Sister   . Cancer Brother     type unknown  . Stroke Mother     Social History History  Substance Use Topics  . Smoking status: Former Smoker    Types: Cigarettes  . Smokeless tobacco: Never Used  . Alcohol Use: No    Allergies  Allergen Reactions  . Codeine Hives  . Vancomycin Rash    Current Outpatient Prescriptions  Medication Sig Dispense Refill  . acetaminophen (TYLENOL) 500 MG tablet Take 500-1,000 mg by mouth every 6 (six) hours as needed.    Marland Kitchen aspirin 325 MG EC tablet Take 325 mg by mouth daily.    Marland Kitchen doxycycline (MONODOX) 100 MG capsule Take 1 capsule by mouth 2 (two) times daily.    Marland Kitchen lanthanum (  FOSRENOL) 1000 MG chewable tablet Chew 1,000-2,000 mg by mouth 3 (three) times daily with meals. Take 2000 mg by mouth 3 times daily with meals and take 1000mg  by mouth with snacks.    Marland Kitchen. omeprazole (PRILOSEC) 20 MG capsule Take 1 capsule (20 mg total) by mouth daily. 90 capsule 3  . SENSIPAR 30 MG tablet Take 30 mg by mouth every evening.     . silver sulfADIAZINE (SILVADENE) 1 % cream Apply 1 application topically daily. 50 g 0  . gabapentin (NEURONTIN) 100 MG capsule Take 100 mg by mouth 2 (two) times daily.     Marland Kitchen. PROAIR HFA 108 (90 BASE) MCG/ACT  inhaler     . traMADol (ULTRAM) 50 MG tablet Take 1 tablet (50 mg total) by mouth every 6 (six) hours as needed. (Patient not taking: Reported on 06/03/2014) 30 tablet 0   No current facility-administered medications for this visit.    Review of Systems  Constitutional: positive for fatigue and weight loss Eyes: negative Ears, nose, mouth, throat, and face: negative Respiratory: negative Cardiovascular: negative Gastrointestinal: positive for diarrhea and Occasional rectal bleeding Genitourinary:negative Integument/breast: negative Hematologic/lymphatic: negative Musculoskeletal:negative Neurological: negative Behavioral/Psych: negative Endocrine: negative Allergic/Immunologic: negative  Physical Exam  ZOX:WRUEARAL:alert, healthy, no distress, well nourished and well developed SKIN: skin color, texture, turgor are normal, no rashes or significant lesions HEAD: Normocephalic, No masses, lesions, tenderness or abnormalities EYES: normal, PERRLA EARS: External ears normal, Canals clear OROPHARYNX:no exudate, no erythema and lips, buccal mucosa, and tongue normal  NECK: supple, no adenopathy, no JVD LYMPH:  no palpable lymphadenopathy, no hepatosplenomegaly BREAST:not examined LUNGS: clear to auscultation , and palpation HEART: regular rate & rhythm, no murmurs and no gallops ABDOMEN:abdomen soft, non-tender, obese, normal bowel sounds and no masses or organomegaly BACK: Back symmetric, no curvature., No CVA tenderness EXTREMITIES:no joint deformities, effusion, or inflammation, no edema, no skin discoloration, no clubbing  NEURO: alert & oriented x 3 with fluent speech, no focal motor/sensory deficits  PERFORMANCE STATUS: ECOG 1  LABORATORY DATA: Lab Results  Component Value Date   WBC 9.1 06/03/2014   HGB 12.8 06/03/2014   HCT 43.0 06/03/2014   MCV 76.6* 06/03/2014   PLT 350 06/03/2014      Chemistry      Component Value Date/Time   NA 142 06/03/2014 1249   NA 142  01/01/2014 1824   K 5.3* 06/03/2014 1249   K 4.2 01/01/2014 1824   CL 100 01/01/2014 1824   CO2 25 06/03/2014 1249   CO2 33* 01/01/2014 1824   BUN 75.0* 06/03/2014 1249   BUN 11 01/01/2014 1824   CREATININE 12.5 Repeated and Verified* 06/03/2014 1249   CREATININE 3.91* 01/01/2014 1824   CREATININE 4.24* 12/26/2006 1500      Component Value Date/Time   CALCIUM 9.5 06/03/2014 1249   CALCIUM 8.6 01/01/2014 1824   CALCIUM 8.9 12/05/2006 1357   ALKPHOS 104 06/03/2014 1249   ALKPHOS 65 01/01/2014 1824   AST 10 06/03/2014 1249   AST 14 01/01/2014 1824   ALT 7 06/03/2014 1249   ALT 10 01/01/2014 1824   BILITOT 0.41 06/03/2014 1249   BILITOT <0.2* 01/01/2014 1824       RADIOGRAPHIC STUDIES: No results found.  ASSESSMENT: This is a very pleasant 81 years old African-American female presented for evaluation of anemia. Her hemoglobin and hematocrit are within the normal range today. Her anemia is probably multifactorial including anemia of chronic disease secondary to end-stage renal disease in addition to nutritional deficiency  from iron deficiency. She has also been complaining of weight loss which could be explained by her persistent diarrhea in addition to the dialysis.    PLAN: I had a lengthy discussion with the patient and her family today about her current condition. Regarding the anemia, her hemoglobin and hematocrit are within the normal range and I don't see any need for intervention at this point. Regarding her weight loss, her recent chest x-ray showed no concerning findings. The patient may need GI evaluation and CT scan of the abdomen pelvis if needed to rule out any occult malignancy. I asked her to get referred from her primary care physician for the GI evaluation especially with the persistent diarrhea and rectal bleeding. Sincerely need to continue seeing the patient at regular basis at this point but I'll be happy to see her in the future if needed. She was advised to  call immediately if she has any concerning symptoms in the interval.  The patient voices understanding of current disease status and treatment options and is in agreement with the current care plan.  All questions were answered. The patient knows to call the clinic with any problems, questions or concerns. We can certainly see the patient much sooner if necessary.  Thank you so much for allowing me to participate in the care of [name]. I will continue to follow up the patient with you and assist in her care.  I spent 40 minutes counseling the patient face to face. The total time spent in the appointment was 60 minutes.  Disclaimer: This note was dictated with voice recognition software. Similar sounding words can inadvertently be transcribed and may not be corrected upon review.   Breeley Bischof K. 06/03/2014, 3:16 PM

## 2014-06-03 NOTE — Progress Notes (Signed)
Checked in new pt with no financial concerns prior to seeing the dr.  Pt is here for a hematology concern so financial assistance may not be needed but she has Raquel's card for any billing or insurance questions or concerns. ° °

## 2014-06-04 DIAGNOSIS — E1129 Type 2 diabetes mellitus with other diabetic kidney complication: Secondary | ICD-10-CM | POA: Diagnosis not present

## 2014-06-04 DIAGNOSIS — N2581 Secondary hyperparathyroidism of renal origin: Secondary | ICD-10-CM | POA: Diagnosis not present

## 2014-06-04 DIAGNOSIS — L97524 Non-pressure chronic ulcer of other part of left foot with necrosis of bone: Secondary | ICD-10-CM | POA: Diagnosis not present

## 2014-06-04 DIAGNOSIS — N186 End stage renal disease: Secondary | ICD-10-CM | POA: Diagnosis not present

## 2014-06-04 DIAGNOSIS — E11621 Type 2 diabetes mellitus with foot ulcer: Secondary | ICD-10-CM | POA: Diagnosis not present

## 2014-06-04 LAB — GLUCOSE, CAPILLARY
GLUCOSE-CAPILLARY: 90 mg/dL (ref 70–99)
Glucose-Capillary: 130 mg/dL — ABNORMAL HIGH (ref 70–99)

## 2014-06-05 DIAGNOSIS — L97524 Non-pressure chronic ulcer of other part of left foot with necrosis of bone: Secondary | ICD-10-CM | POA: Diagnosis not present

## 2014-06-05 DIAGNOSIS — E11621 Type 2 diabetes mellitus with foot ulcer: Secondary | ICD-10-CM | POA: Diagnosis not present

## 2014-06-05 LAB — GLUCOSE, CAPILLARY
GLUCOSE-CAPILLARY: 89 mg/dL (ref 70–99)
Glucose-Capillary: 92 mg/dL (ref 70–99)

## 2014-06-06 DIAGNOSIS — N186 End stage renal disease: Secondary | ICD-10-CM | POA: Diagnosis not present

## 2014-06-06 DIAGNOSIS — E1129 Type 2 diabetes mellitus with other diabetic kidney complication: Secondary | ICD-10-CM | POA: Diagnosis not present

## 2014-06-06 DIAGNOSIS — E11621 Type 2 diabetes mellitus with foot ulcer: Secondary | ICD-10-CM | POA: Diagnosis not present

## 2014-06-06 DIAGNOSIS — N2581 Secondary hyperparathyroidism of renal origin: Secondary | ICD-10-CM | POA: Diagnosis not present

## 2014-06-06 DIAGNOSIS — L97524 Non-pressure chronic ulcer of other part of left foot with necrosis of bone: Secondary | ICD-10-CM | POA: Diagnosis not present

## 2014-06-06 LAB — GLUCOSE, CAPILLARY
GLUCOSE-CAPILLARY: 126 mg/dL — AB (ref 70–99)
GLUCOSE-CAPILLARY: 179 mg/dL — AB (ref 70–99)

## 2014-06-06 NOTE — H&P (Signed)
NAME:  [name], [name]                     ACCOUNT NO.:  MEDICAL RECORD NO.:  [id number]  LOCATION:                                 FACILITY:  PHYSICIAN:  Evlyn KannerErrol Americus Perkey, MD       DATE OF BIRTH:  1932-08-18  DATE OF ADMISSION: DATE OF DISCHARGE:                             HISTORY & PHYSICAL   [name] is a pleasant 81 year old patient who has just finished her HBO today and has come for a review. She has 2 more HBOs left and is completing a course of 90 HBOs.  She has a left big toe ulcer, which is Wagner grade 3.  She has no specific problems at the present time.  On clinical examination, her wound is open and she has healthy granulation tissue.  It probes down to bone.  On examination of the wound, the dimensions are noted in the office chart.  ASSESSMENT AND PLAN:  Since the patient has almost finished her 90 treatments with hyperbaric oxygen at this stage, I am not certain how we are going to continue to heal this.  We will continue present dressing with Prisma and cover the dressing appropriately with a wrap.  I discussed with the patient that I am going to refer her to our colleagues in the Plastic Surgery department who would kindly give the opinion regarding consideration of closure of this wound with a local flap.  I do understand that the depths of the wound there is bone, but if we get a good closure and there is no evidence of infection, this maybe our best option to heal.  I will ask her to see our colleagues in the Plastic Surgery department and will give her an appropriate referral depending on the availability of an appointment either with Dr. Kelly SplinterSanger or with Dr. Leta Baptisthimmappa.  The patient and the family are agreeable about this.          ______________________________ Evlyn KannerErrol Yohanna Tow, MD     EB/MEDQ  D:  06/05/2014  T:  06/06/2014  Job:  [id number]

## 2014-06-07 DIAGNOSIS — E11621 Type 2 diabetes mellitus with foot ulcer: Secondary | ICD-10-CM | POA: Diagnosis not present

## 2014-06-07 DIAGNOSIS — L97524 Non-pressure chronic ulcer of other part of left foot with necrosis of bone: Secondary | ICD-10-CM | POA: Diagnosis not present

## 2014-06-07 LAB — GLUCOSE, CAPILLARY
Glucose-Capillary: 112 mg/dL — ABNORMAL HIGH (ref 70–99)
Glucose-Capillary: 119 mg/dL — ABNORMAL HIGH (ref 70–99)

## 2014-06-08 DIAGNOSIS — N2581 Secondary hyperparathyroidism of renal origin: Secondary | ICD-10-CM | POA: Diagnosis not present

## 2014-06-08 DIAGNOSIS — N186 End stage renal disease: Secondary | ICD-10-CM | POA: Diagnosis not present

## 2014-06-08 DIAGNOSIS — E1129 Type 2 diabetes mellitus with other diabetic kidney complication: Secondary | ICD-10-CM | POA: Diagnosis not present

## 2014-06-09 DIAGNOSIS — Z992 Dependence on renal dialysis: Secondary | ICD-10-CM | POA: Diagnosis not present

## 2014-06-09 DIAGNOSIS — N186 End stage renal disease: Secondary | ICD-10-CM | POA: Diagnosis not present

## 2014-06-11 DIAGNOSIS — D631 Anemia in chronic kidney disease: Secondary | ICD-10-CM | POA: Diagnosis not present

## 2014-06-11 DIAGNOSIS — N2581 Secondary hyperparathyroidism of renal origin: Secondary | ICD-10-CM | POA: Diagnosis not present

## 2014-06-11 DIAGNOSIS — N186 End stage renal disease: Secondary | ICD-10-CM | POA: Diagnosis not present

## 2014-06-11 DIAGNOSIS — Z23 Encounter for immunization: Secondary | ICD-10-CM | POA: Diagnosis not present

## 2014-06-11 DIAGNOSIS — E1129 Type 2 diabetes mellitus with other diabetic kidney complication: Secondary | ICD-10-CM | POA: Diagnosis not present

## 2014-06-12 DIAGNOSIS — I871 Compression of vein: Secondary | ICD-10-CM | POA: Diagnosis not present

## 2014-06-12 DIAGNOSIS — Z992 Dependence on renal dialysis: Secondary | ICD-10-CM | POA: Diagnosis not present

## 2014-06-12 DIAGNOSIS — T82858D Stenosis of vascular prosthetic devices, implants and grafts, subsequent encounter: Secondary | ICD-10-CM | POA: Diagnosis not present

## 2014-06-12 DIAGNOSIS — N186 End stage renal disease: Secondary | ICD-10-CM | POA: Diagnosis not present

## 2014-06-13 DIAGNOSIS — N186 End stage renal disease: Secondary | ICD-10-CM | POA: Diagnosis not present

## 2014-06-13 DIAGNOSIS — N2581 Secondary hyperparathyroidism of renal origin: Secondary | ICD-10-CM | POA: Diagnosis not present

## 2014-06-13 DIAGNOSIS — D631 Anemia in chronic kidney disease: Secondary | ICD-10-CM | POA: Diagnosis not present

## 2014-06-13 DIAGNOSIS — E1129 Type 2 diabetes mellitus with other diabetic kidney complication: Secondary | ICD-10-CM | POA: Diagnosis not present

## 2014-06-13 DIAGNOSIS — Z23 Encounter for immunization: Secondary | ICD-10-CM | POA: Diagnosis not present

## 2014-06-15 DIAGNOSIS — D631 Anemia in chronic kidney disease: Secondary | ICD-10-CM | POA: Diagnosis not present

## 2014-06-15 DIAGNOSIS — N2581 Secondary hyperparathyroidism of renal origin: Secondary | ICD-10-CM | POA: Diagnosis not present

## 2014-06-15 DIAGNOSIS — E1129 Type 2 diabetes mellitus with other diabetic kidney complication: Secondary | ICD-10-CM | POA: Diagnosis not present

## 2014-06-15 DIAGNOSIS — Z23 Encounter for immunization: Secondary | ICD-10-CM | POA: Diagnosis not present

## 2014-06-15 DIAGNOSIS — N186 End stage renal disease: Secondary | ICD-10-CM | POA: Diagnosis not present

## 2014-06-17 ENCOUNTER — Encounter (HOSPITAL_BASED_OUTPATIENT_CLINIC_OR_DEPARTMENT_OTHER): Payer: Medicare Other | Attending: Plastic Surgery

## 2014-06-17 DIAGNOSIS — E11621 Type 2 diabetes mellitus with foot ulcer: Secondary | ICD-10-CM | POA: Diagnosis present

## 2014-06-17 DIAGNOSIS — L97524 Non-pressure chronic ulcer of other part of left foot with necrosis of bone: Secondary | ICD-10-CM | POA: Diagnosis not present

## 2014-06-18 DIAGNOSIS — Z23 Encounter for immunization: Secondary | ICD-10-CM | POA: Diagnosis not present

## 2014-06-18 DIAGNOSIS — N2581 Secondary hyperparathyroidism of renal origin: Secondary | ICD-10-CM | POA: Diagnosis not present

## 2014-06-18 DIAGNOSIS — E1129 Type 2 diabetes mellitus with other diabetic kidney complication: Secondary | ICD-10-CM | POA: Diagnosis not present

## 2014-06-18 DIAGNOSIS — D631 Anemia in chronic kidney disease: Secondary | ICD-10-CM | POA: Diagnosis not present

## 2014-06-18 DIAGNOSIS — N186 End stage renal disease: Secondary | ICD-10-CM | POA: Diagnosis not present

## 2014-06-18 NOTE — Consult Note (Signed)
[name], [name]                ACCOUNT NO.:  [id number]  MEDICAL RECORD NO.:  [id number]  LOCATION:  FOOT                         FACILITY:  [facility]  PHYSICIAN:  Glenna Fellows, MD   DATE OF BIRTH:  Jan 21, 1933  DATE OF CONSULTATION:  06/17/2014 DATE OF DISCHARGE:                                CONSULTATION   CHIEF COMPLAINT:  Chronic ulceration, Wagner 2, of left hallux.  HISTORY OF PRESENT ILLNESS:  The patient is a 81 year old ambulatory female who was referred from Dr. Marcie Bal clinic for evaluation of a local flap or primary closure of chronic ulceration of her left great toe.  The patient has had multiple studies including MRI and plain films demonstrating osteomyelitis.  She has undergone in-office debridement of the bone with Dr. Jimmey Ralph in the past and has undergone multiple methods of wound care including silver alginate as well as collagen currently. She has been previously evaluated by Dr. Ilsa Iha from Infectious Disease, Dr. Loreta Ave through Podiatry, and Dr. Darrick Penna with Vascular Surgery.  Both Dr. Loreta Ave and Dr. Darrick Penna had suggested toe amputation and possible first ray amputation though both have noted that she is a still at high risk of nonhealing of the surgical amputation sites given her underlying end-stage renal disease, peripheral vascular disease, and diabetes mellitus.  Review of studies today included an arterial ultrasound in June 2015, which showed a left-sided ABI of 0.75 and toe brachial index of 0.27 on this side.  Most recent hemoglobin A1c is from August 2015 and is labeled as 5.8.  The patient has currently completed 90 HBO treatments.  She was last seen by Infectious Disease in November 2015. Dr. Ilsa Iha suggested that she had failed oral antibiotic treatment and should go back to Dr. Loreta Ave or Dr. Darrick Penna for amputation and also discussed her risk for recurrent C diff.  PHYSICAL EXAMINATION:  Blood pressure is 159/57, pulse is 81, temperature is  98.1.  Left great toe is a Wagner 3 ulceration, measured as 1.4 x 1.3 x 1.1 that is minimally changed.  The wound was clean. There was hyperpigmentation of the toe.  There was no fluctuance, though she has obvious bony defect beneath the soft tissue.  Examination of her most recent x-ray reveals a small remnant of her distal phalanx and there appears to be some chronic erosion of her distal proximal phalanx over this toe and no real joint space that is maintained.  ASSESSMENT:  Wagner 2 ulceration with osteomyelitis that has failed 90 treatments of hyperbaric treatment and oral antibiotics.  Review of the chart shows 1 wound culture demonstrating E coli.  I am not sure if this is from bone.  There is also prescription for doxycycline and Keflex. The patient herself cannot recall what antibiotic she is currently taking.  Given the long history and noted peripheral vascular disease at the level of her toe as well as possible residual underlying osteomyelitis of the remnants as well as significant shortening already of the bone, I do not feel that primary closure or limited debridement of the toe would be successful.  Certainly if the patient was committed to salvaging her toe, we could try limited debridement and primary closure,  however, she must understand that this would lead to a much larger wound if it broke down and again we would be back to considering toe amputation versus higher level of amputation.  I have counseled the patient that she should go back to see Dr. Loreta AveWagner or Dr. Darrick PennaFields, both of which have stated they could take care of her further.  The alternative is to continue to perform local wound care and to observe the wound.  She may not have the ability to heal this wound secondarily, however, in absence of acute infection, she could delay her amputation.  The patient will follow up in 1 week's time in Dr. Marcie BalBritto's clinic.          ______________________________ Glenna FellowsBrinda  Kinta Martis, MD MBA     BT/MEDQ  D:  06/17/2014  T:  06/18/2014  Job:  [id number]

## 2014-06-20 DIAGNOSIS — E1129 Type 2 diabetes mellitus with other diabetic kidney complication: Secondary | ICD-10-CM | POA: Diagnosis not present

## 2014-06-20 DIAGNOSIS — D631 Anemia in chronic kidney disease: Secondary | ICD-10-CM | POA: Diagnosis not present

## 2014-06-20 DIAGNOSIS — N2581 Secondary hyperparathyroidism of renal origin: Secondary | ICD-10-CM | POA: Diagnosis not present

## 2014-06-20 DIAGNOSIS — Z23 Encounter for immunization: Secondary | ICD-10-CM | POA: Diagnosis not present

## 2014-06-20 DIAGNOSIS — N186 End stage renal disease: Secondary | ICD-10-CM | POA: Diagnosis not present

## 2014-06-22 DIAGNOSIS — D631 Anemia in chronic kidney disease: Secondary | ICD-10-CM | POA: Diagnosis not present

## 2014-06-22 DIAGNOSIS — N186 End stage renal disease: Secondary | ICD-10-CM | POA: Diagnosis not present

## 2014-06-22 DIAGNOSIS — E1129 Type 2 diabetes mellitus with other diabetic kidney complication: Secondary | ICD-10-CM | POA: Diagnosis not present

## 2014-06-22 DIAGNOSIS — Z23 Encounter for immunization: Secondary | ICD-10-CM | POA: Diagnosis not present

## 2014-06-22 DIAGNOSIS — N2581 Secondary hyperparathyroidism of renal origin: Secondary | ICD-10-CM | POA: Diagnosis not present

## 2014-06-24 DIAGNOSIS — L97524 Non-pressure chronic ulcer of other part of left foot with necrosis of bone: Secondary | ICD-10-CM | POA: Diagnosis not present

## 2014-06-24 DIAGNOSIS — E11621 Type 2 diabetes mellitus with foot ulcer: Secondary | ICD-10-CM | POA: Diagnosis not present

## 2014-06-25 DIAGNOSIS — E1129 Type 2 diabetes mellitus with other diabetic kidney complication: Secondary | ICD-10-CM | POA: Diagnosis not present

## 2014-06-25 DIAGNOSIS — N2581 Secondary hyperparathyroidism of renal origin: Secondary | ICD-10-CM | POA: Diagnosis not present

## 2014-06-25 DIAGNOSIS — Z23 Encounter for immunization: Secondary | ICD-10-CM | POA: Diagnosis not present

## 2014-06-25 DIAGNOSIS — D631 Anemia in chronic kidney disease: Secondary | ICD-10-CM | POA: Diagnosis not present

## 2014-06-25 DIAGNOSIS — N186 End stage renal disease: Secondary | ICD-10-CM | POA: Diagnosis not present

## 2014-06-27 DIAGNOSIS — D631 Anemia in chronic kidney disease: Secondary | ICD-10-CM | POA: Diagnosis not present

## 2014-06-27 DIAGNOSIS — N2581 Secondary hyperparathyroidism of renal origin: Secondary | ICD-10-CM | POA: Diagnosis not present

## 2014-06-27 DIAGNOSIS — N186 End stage renal disease: Secondary | ICD-10-CM | POA: Diagnosis not present

## 2014-06-27 DIAGNOSIS — E1129 Type 2 diabetes mellitus with other diabetic kidney complication: Secondary | ICD-10-CM | POA: Diagnosis not present

## 2014-06-27 DIAGNOSIS — Z23 Encounter for immunization: Secondary | ICD-10-CM | POA: Diagnosis not present

## 2014-06-29 DIAGNOSIS — E1129 Type 2 diabetes mellitus with other diabetic kidney complication: Secondary | ICD-10-CM | POA: Diagnosis not present

## 2014-06-29 DIAGNOSIS — Z23 Encounter for immunization: Secondary | ICD-10-CM | POA: Diagnosis not present

## 2014-06-29 DIAGNOSIS — D631 Anemia in chronic kidney disease: Secondary | ICD-10-CM | POA: Diagnosis not present

## 2014-06-29 DIAGNOSIS — N2581 Secondary hyperparathyroidism of renal origin: Secondary | ICD-10-CM | POA: Diagnosis not present

## 2014-06-29 DIAGNOSIS — N186 End stage renal disease: Secondary | ICD-10-CM | POA: Diagnosis not present

## 2014-07-01 ENCOUNTER — Ambulatory Visit: Payer: Medicare Other | Admitting: Interventional Cardiology

## 2014-07-02 ENCOUNTER — Telehealth: Payer: Self-pay | Admitting: Cardiovascular Disease

## 2014-07-02 DIAGNOSIS — N2581 Secondary hyperparathyroidism of renal origin: Secondary | ICD-10-CM | POA: Diagnosis not present

## 2014-07-02 DIAGNOSIS — N186 End stage renal disease: Secondary | ICD-10-CM | POA: Diagnosis not present

## 2014-07-02 DIAGNOSIS — E1129 Type 2 diabetes mellitus with other diabetic kidney complication: Secondary | ICD-10-CM | POA: Diagnosis not present

## 2014-07-02 DIAGNOSIS — Z23 Encounter for immunization: Secondary | ICD-10-CM | POA: Diagnosis not present

## 2014-07-02 DIAGNOSIS — D631 Anemia in chronic kidney disease: Secondary | ICD-10-CM | POA: Diagnosis not present

## 2014-07-02 NOTE — Telephone Encounter (Signed)
Received records from [facility] [place] for appointment on 07/31/14 with Dr Allyson SabalBerry.  [facility] Dr Hazle CocaBerry's schedule on 07/31/14.  lp

## 2014-07-04 DIAGNOSIS — D631 Anemia in chronic kidney disease: Secondary | ICD-10-CM | POA: Diagnosis not present

## 2014-07-04 DIAGNOSIS — N186 End stage renal disease: Secondary | ICD-10-CM | POA: Diagnosis not present

## 2014-07-04 DIAGNOSIS — N2581 Secondary hyperparathyroidism of renal origin: Secondary | ICD-10-CM | POA: Diagnosis not present

## 2014-07-04 DIAGNOSIS — E1129 Type 2 diabetes mellitus with other diabetic kidney complication: Secondary | ICD-10-CM | POA: Diagnosis not present

## 2014-07-04 DIAGNOSIS — Z23 Encounter for immunization: Secondary | ICD-10-CM | POA: Diagnosis not present

## 2014-07-06 DIAGNOSIS — E1129 Type 2 diabetes mellitus with other diabetic kidney complication: Secondary | ICD-10-CM | POA: Diagnosis not present

## 2014-07-06 DIAGNOSIS — N186 End stage renal disease: Secondary | ICD-10-CM | POA: Diagnosis not present

## 2014-07-06 DIAGNOSIS — Z23 Encounter for immunization: Secondary | ICD-10-CM | POA: Diagnosis not present

## 2014-07-06 DIAGNOSIS — D631 Anemia in chronic kidney disease: Secondary | ICD-10-CM | POA: Diagnosis not present

## 2014-07-06 DIAGNOSIS — N2581 Secondary hyperparathyroidism of renal origin: Secondary | ICD-10-CM | POA: Diagnosis not present

## 2014-07-08 DIAGNOSIS — E11621 Type 2 diabetes mellitus with foot ulcer: Secondary | ICD-10-CM | POA: Diagnosis not present

## 2014-07-08 DIAGNOSIS — N186 End stage renal disease: Secondary | ICD-10-CM | POA: Diagnosis not present

## 2014-07-08 DIAGNOSIS — L97524 Non-pressure chronic ulcer of other part of left foot with necrosis of bone: Secondary | ICD-10-CM | POA: Diagnosis not present

## 2014-07-08 DIAGNOSIS — Z992 Dependence on renal dialysis: Secondary | ICD-10-CM | POA: Diagnosis not present

## 2014-07-09 DIAGNOSIS — E1129 Type 2 diabetes mellitus with other diabetic kidney complication: Secondary | ICD-10-CM | POA: Diagnosis not present

## 2014-07-09 DIAGNOSIS — D631 Anemia in chronic kidney disease: Secondary | ICD-10-CM | POA: Diagnosis not present

## 2014-07-09 DIAGNOSIS — N2581 Secondary hyperparathyroidism of renal origin: Secondary | ICD-10-CM | POA: Diagnosis not present

## 2014-07-09 DIAGNOSIS — N186 End stage renal disease: Secondary | ICD-10-CM | POA: Diagnosis not present

## 2014-07-10 ENCOUNTER — Ambulatory Visit (INDEPENDENT_AMBULATORY_CARE_PROVIDER_SITE_OTHER): Payer: Medicare Other

## 2014-07-10 ENCOUNTER — Encounter: Payer: Self-pay | Admitting: Podiatry

## 2014-07-10 ENCOUNTER — Other Ambulatory Visit: Payer: Self-pay | Admitting: Podiatry

## 2014-07-10 ENCOUNTER — Ambulatory Visit (INDEPENDENT_AMBULATORY_CARE_PROVIDER_SITE_OTHER): Payer: Medicare Other | Admitting: Podiatry

## 2014-07-10 VITALS — BP 124/62 | HR 91 | Resp 18

## 2014-07-10 DIAGNOSIS — M79676 Pain in unspecified toe(s): Secondary | ICD-10-CM

## 2014-07-10 DIAGNOSIS — L97519 Non-pressure chronic ulcer of other part of right foot with unspecified severity: Secondary | ICD-10-CM | POA: Diagnosis not present

## 2014-07-10 DIAGNOSIS — R52 Pain, unspecified: Secondary | ICD-10-CM | POA: Diagnosis not present

## 2014-07-10 DIAGNOSIS — B351 Tinea unguium: Secondary | ICD-10-CM

## 2014-07-10 NOTE — Patient Instructions (Signed)
Continue daily dressing changes on the right foot. Monitor for any signs/symptoms of infection. Call the office immediately if any occur or go directly to the emergency room. Call with any questions/concerns.

## 2014-07-11 DIAGNOSIS — D631 Anemia in chronic kidney disease: Secondary | ICD-10-CM | POA: Diagnosis not present

## 2014-07-11 DIAGNOSIS — N186 End stage renal disease: Secondary | ICD-10-CM | POA: Diagnosis not present

## 2014-07-11 DIAGNOSIS — E1129 Type 2 diabetes mellitus with other diabetic kidney complication: Secondary | ICD-10-CM | POA: Diagnosis not present

## 2014-07-11 DIAGNOSIS — N2581 Secondary hyperparathyroidism of renal origin: Secondary | ICD-10-CM | POA: Diagnosis not present

## 2014-07-13 DIAGNOSIS — N2581 Secondary hyperparathyroidism of renal origin: Secondary | ICD-10-CM | POA: Diagnosis not present

## 2014-07-13 DIAGNOSIS — N186 End stage renal disease: Secondary | ICD-10-CM | POA: Diagnosis not present

## 2014-07-13 DIAGNOSIS — E1129 Type 2 diabetes mellitus with other diabetic kidney complication: Secondary | ICD-10-CM | POA: Diagnosis not present

## 2014-07-13 DIAGNOSIS — D631 Anemia in chronic kidney disease: Secondary | ICD-10-CM | POA: Diagnosis not present

## 2014-07-13 LAB — WOUND CULTURE
Gram Stain: NONE SEEN
Gram Stain: NONE SEEN

## 2014-07-16 ENCOUNTER — Emergency Department (HOSPITAL_COMMUNITY)
Admission: EM | Admit: 2014-07-16 | Discharge: 2014-07-16 | Disposition: A | Discharge status: Home / Self Care | Payer: Medicare Other | Attending: Emergency Medicine | Admitting: Emergency Medicine

## 2014-07-16 ENCOUNTER — Encounter (HOSPITAL_COMMUNITY): Payer: Self-pay | Admitting: Emergency Medicine

## 2014-07-16 DIAGNOSIS — E1129 Type 2 diabetes mellitus with other diabetic kidney complication: Secondary | ICD-10-CM | POA: Diagnosis not present

## 2014-07-16 DIAGNOSIS — Z7982 Long term (current) use of aspirin: Secondary | ICD-10-CM | POA: Diagnosis not present

## 2014-07-16 DIAGNOSIS — Y841 Kidney dialysis as the cause of abnormal reaction of the patient, or of later complication, without mention of misadventure at the time of the procedure: Secondary | ICD-10-CM | POA: Insufficient documentation

## 2014-07-16 DIAGNOSIS — T82838A Hemorrhage of vascular prosthetic devices, implants and grafts, initial encounter: Secondary | ICD-10-CM | POA: Insufficient documentation

## 2014-07-16 DIAGNOSIS — N189 Chronic kidney disease, unspecified: Secondary | ICD-10-CM | POA: Insufficient documentation

## 2014-07-16 DIAGNOSIS — Z862 Personal history of diseases of the blood and blood-forming organs and certain disorders involving the immune mechanism: Secondary | ICD-10-CM | POA: Diagnosis not present

## 2014-07-16 DIAGNOSIS — Z79899 Other long term (current) drug therapy: Secondary | ICD-10-CM | POA: Insufficient documentation

## 2014-07-16 DIAGNOSIS — E213 Hyperparathyroidism, unspecified: Secondary | ICD-10-CM | POA: Insufficient documentation

## 2014-07-16 DIAGNOSIS — Z8679 Personal history of other diseases of the circulatory system: Secondary | ICD-10-CM | POA: Diagnosis not present

## 2014-07-16 DIAGNOSIS — K219 Gastro-esophageal reflux disease without esophagitis: Secondary | ICD-10-CM | POA: Diagnosis not present

## 2014-07-16 DIAGNOSIS — Z792 Long term (current) use of antibiotics: Secondary | ICD-10-CM | POA: Diagnosis not present

## 2014-07-16 DIAGNOSIS — N186 End stage renal disease: Secondary | ICD-10-CM | POA: Diagnosis not present

## 2014-07-16 DIAGNOSIS — Z87891 Personal history of nicotine dependence: Secondary | ICD-10-CM | POA: Insufficient documentation

## 2014-07-16 DIAGNOSIS — Z8619 Personal history of other infectious and parasitic diseases: Secondary | ICD-10-CM | POA: Insufficient documentation

## 2014-07-16 DIAGNOSIS — E669 Obesity, unspecified: Secondary | ICD-10-CM | POA: Diagnosis not present

## 2014-07-16 DIAGNOSIS — Z8659 Personal history of other mental and behavioral disorders: Secondary | ICD-10-CM | POA: Insufficient documentation

## 2014-07-16 DIAGNOSIS — E1140 Type 2 diabetes mellitus with diabetic neuropathy, unspecified: Secondary | ICD-10-CM | POA: Insufficient documentation

## 2014-07-16 DIAGNOSIS — D631 Anemia in chronic kidney disease: Secondary | ICD-10-CM | POA: Diagnosis not present

## 2014-07-16 DIAGNOSIS — N2581 Secondary hyperparathyroidism of renal origin: Secondary | ICD-10-CM | POA: Diagnosis not present

## 2014-07-16 DIAGNOSIS — Z8673 Personal history of transient ischemic attack (TIA), and cerebral infarction without residual deficits: Secondary | ICD-10-CM | POA: Diagnosis not present

## 2014-07-16 DIAGNOSIS — M1990 Unspecified osteoarthritis, unspecified site: Secondary | ICD-10-CM | POA: Insufficient documentation

## 2014-07-16 DIAGNOSIS — R58 Hemorrhage, not elsewhere classified: Secondary | ICD-10-CM | POA: Diagnosis not present

## 2014-07-16 NOTE — ED Notes (Signed)
Debbie, EMT holding pressure on right groin area, with ABD gauze.

## 2014-07-16 NOTE — ED Notes (Signed)
To ED via [facility] on [address]-- with c/o right femoral graft bleeding- greater than 1 hour at center with pressure help for 40-50 minutes. When pressure is released pt has constant bleed- large amount.

## 2014-07-16 NOTE — ED Provider Notes (Signed)
CSN: [id number]     Arrival date & time 07/16/14  1212 History   First MD Initiated Contact with Patient 07/16/14 1222     Chief Complaint  Patient presents with  . bleeding graft      (Consider location/radiation/quality/duration/timing/severity/associated sxs/prior Treatment) HPI Comments: Pt is a 81 y/o female on dialysis - she presented from dialysis with c/o bleeding graft in her R upper leg - bleeding could not be controlled during dialysis - sx are ongoing moderate and better with holding pressure.  She has no associated f/c/n/v.    The history is provided by the patient.    Past Medical History  Diagnosis Date  . Diabetes mellitus   . Anemia   . Hyperparathyroidism   . Depression   . Obesity   . Osteoarthritis   . MRSA (methicillin resistant staph aureus) culture positive   . Gout   . C. difficile diarrhea   . Chronic kidney disease     on Dialysis  . Diabetic neuropathy   . GERD (gastroesophageal reflux disease)   . Stroke   . Peripheral arterial disease     Gangrene-Left Great Toe   Past Surgical History  Procedure Laterality Date  . Breast biopsy Right   . Shuntogram Right 09/06/12    Thigh graft  . Sp declot avgg Right Sep 14, 2012    Right thigh graft   Family History  Problem Relation Age of Onset  . Cancer Father     type unknown  . Diabetes Sister   . Cancer Brother     type unknown  . Stroke Mother    History  Substance Use Topics  . Smoking status: Former Smoker    Types: Cigarettes  . Smokeless tobacco: Never Used  . Alcohol Use: No   OB History    No data available     Review of Systems  All other systems reviewed and are negative.     Allergies  Codeine and Vancomycin  Home Medications   Prior to Admission medications   Medication Sig Start Date End Date Taking? Authorizing Provider  acetaminophen (TYLENOL) 500 MG tablet Take 500-1,000 mg by mouth every 6 (six) hours as needed.    Historical Provider, MD  aspirin 325 MG EC  tablet Take 325 mg by mouth daily.    Historical Provider, MD  doxycycline (MONODOX) 100 MG capsule Take 1 capsule by mouth 2 (two) times daily. 05/04/14   Historical Provider, MD  gabapentin (NEURONTIN) 100 MG capsule Take 100 mg by mouth 2 (two) times daily.  10/10/13   Historical Provider, MD  lanthanum (FOSRENOL) 1000 MG chewable tablet Chew 1,000-2,000 mg by mouth 3 (three) times daily with meals. Take 2000 mg by mouth 3 times daily with meals and take 1000mg  by mouth with snacks.    Historical Provider, MD  omeprazole (PRILOSEC) 20 MG capsule Take 1 capsule (20 mg total) by mouth daily. 11/28/13   Louis Meckelobert D Kaplan, MD  PROAIR HFA 108 [phone](90 BASE) MCG/ACT inhaler  04/26/14   Historical Provider, MD  SENSIPAR 30 MG tablet Take 30 mg by mouth every evening.  04/13/13   Historical Provider, MD  silver sulfADIAZINE (SILVADENE) 1 % cream Apply 1 application topically daily. 01/02/14   Vivi BarrackMatthew R Wagoner, DPM  traMADol (ULTRAM) 50 MG tablet Take 1 tablet (50 mg total) by mouth every 6 (six) hours as needed. Patient not taking: Reported on 06/03/2014 01/01/14   Mancel BaleElliott Wentz, MD   BP 130/47 mmHg  Pulse 91  Temp(Src) 98.7 F (37.1 C) (Oral)  Resp 13  SpO2 100% Physical Exam  Constitutional: She appears well-developed and well-nourished. No distress.  HENT:  Head: Normocephalic and atraumatic.  Mouth/Throat: Oropharynx is clear and moist. No oropharyngeal exudate.  Eyes: Conjunctivae and EOM are normal. Pupils are equal, round, and reactive to light. Right eye exhibits no discharge. Left eye exhibits no discharge. No scleral icterus.  Neck: Normal range of motion. Neck supple. No JVD present. No thyromegaly present.  Cardiovascular: Normal rate, regular rhythm, normal heart sounds and intact distal pulses.  Exam reveals no gallop and no friction rub.   No murmur heard. Pulmonary/Chest: Effort normal and breath sounds normal. No respiratory distress. She has no wheezes. She has no rales.  Abdominal: Soft.  Bowel sounds are normal. She exhibits no distension and no mass. There is no tenderness.  Musculoskeletal: Normal range of motion. She exhibits no edema or tenderness.  Lymphadenopathy:    She has no cervical adenopathy.  Neurological: She is alert. Coordination normal.  Skin: Skin is warm and dry. No rash noted. No erythema.  Graft in the right upper extremity has good thrill, right medial proximal thigh with no bleeding when dressing removed, we'll place on quick clot and pressure dressing.  Psychiatric: She has a normal mood and affect. Her behavior is normal.  Nursing note and vitals reviewed.   ED Course  Procedures (including critical care time) Labs Review Labs Reviewed - No data to display  Imaging Review No results found.    MDM   Final diagnoses:  Bleeding from dialysis shunt, initial encounter    Short observation period, no bleeding at this time, vital signs normal.  No further bleeding after pressure with sterile dressing.  VS normal - stable for d/c.  Pulse of 100 on my exam.  Sterile dressing in place on d/c.   Eber Hong, MD 07/16/14 [phone]

## 2014-07-16 NOTE — Progress Notes (Signed)
Patient ID: KAMILLA HANDS, female   DOB: 1933/05/05, 81 y.o.   MRN: [id number]  Subjective: 81 year old female presents the office today for complaints of painful, thick elongated toenails which she is unable to trim herself. Also states that she has painful calluses perfectly over the right foot. She denies any redness or drainage on the callus sites. She has been continuing to go to the wound care center for left hallux ulceration and osteomyelitis. She also states that she has been continuing on antibiotics. Denies any systemic complaints as fevers, chills, nausea, vomiting. No other complaints at this time.  Objective: AAO 3, NAD DP/PT pulses decreased bilaterally, CRT less than 3 seconds Decreased protective sensation with Simms Weinstein monofilament, decreased vibratory sensation, Achilles tendon reflex intact. Nails are hypertrophic, dystrophic, elongated, brittle, discolored 9. There is no swelling erythema or drainage line nail sites. This objective tenderness on nails 9. Full thickness ulceration of the distal aspect of the left hallux with of fibrotic granular wound base. There is no surrounding erythema, ascending saline disc, fluctuance, crepitus, purulence. There is no malodor. Hyperkeratotic lesions bilateral submetatarsal 5/1. Upon debridement of the hyperkeratotic lesion sub-metatarsal 5 on the right small met of purulence was expressed. Upon debridement of the hyperkeratotic lesion there was an ulceration which is granular and wound base measuring approximately 1.4 x 1.2 cm. There is no probing, undermining, tunneling. Once the hyperkeratotic tissue was debrided the area was irrigated and no further purulence was expressed. There  does not appear to be any deep infection and the purulence appeared to be superficial onto the callus. there is no surrounding erythema, ascending cellulitis. There is no malodor.  No other open lesions or pre-ulcer lesions identified bilaterally.  No other  areas of tenderness to bilateral lower extremity's.   Assessment : 81 year old female with symptomatic onychomycosis 9, new ulceration right submetatarsal 5   Plan : -Treatment options discussed including alternatives, risks, complications  -Nail sharply debrided 9 without complication/bleeding.  -Hyperkeratotic lesion sharply debrided 4. Upon debridement of the right second metatarsal 5 lesion there was underlying purulence and wound. A culture was obtained. Continue Silvadene dressing changes this area daily. Monitor for any signs or symptoms of infection to call the office immediately should any occur go to the ER. She is already on antibiotics.  -Will defer treatment of the left hallux ulcer to the wound care center as they are actively treating this.  -Follow-up in 1 week with me as she does not go to the wound care center for 2 weeks. In the meantime occurs call the office precautions, concerns, change in symptoms.

## 2014-07-16 NOTE — Discharge Instructions (Signed)
Return if your dialysis access starts bleeding again - you may take off the dressing tomorrow - if you need to soak it in water before taking it off, you may (if it is stuck to the skin).  Please call your doctor for a followup appointment within 24-48 hours. When you talk to your doctor please let them know that you were seen in the emergency department and have them acquire all of your records so that they can discuss the findings with you and formulate a treatment plan to fully care for your new and ongoing problems.

## 2014-07-16 NOTE — ED Notes (Signed)
Dr. Hyacinth MeekerMiller at bedside-- bandage removed-- not bleeding at present-- QUICK CLOT applied---pressure bandage applied.

## 2014-07-17 ENCOUNTER — Ambulatory Visit (INDEPENDENT_AMBULATORY_CARE_PROVIDER_SITE_OTHER): Payer: Medicare Other | Admitting: Podiatry

## 2014-07-17 ENCOUNTER — Encounter: Payer: Self-pay | Admitting: Podiatry

## 2014-07-17 VITALS — BP 97/72 | HR 80 | Resp 18

## 2014-07-17 DIAGNOSIS — L97519 Non-pressure chronic ulcer of other part of right foot with unspecified severity: Secondary | ICD-10-CM

## 2014-07-17 DIAGNOSIS — L89891 Pressure ulcer of other site, stage 1: Secondary | ICD-10-CM

## 2014-07-17 NOTE — Patient Instructions (Signed)
Follow-up with me in 1 week if you cannot be seen at the wound care center. Otherwise, follow-up in 2 months. Follow-up sooner if there is any changes or increased pain.  Continue daily dressing changes as discussed Monitor for any signs/symptoms of infection. Call the office immediately if any occur or go directly to the emergency room. Call with any questions/concerns.

## 2014-07-18 DIAGNOSIS — D631 Anemia in chronic kidney disease: Secondary | ICD-10-CM | POA: Diagnosis not present

## 2014-07-18 DIAGNOSIS — N2581 Secondary hyperparathyroidism of renal origin: Secondary | ICD-10-CM | POA: Diagnosis not present

## 2014-07-18 DIAGNOSIS — I871 Compression of vein: Secondary | ICD-10-CM | POA: Diagnosis not present

## 2014-07-18 DIAGNOSIS — Z992 Dependence on renal dialysis: Secondary | ICD-10-CM | POA: Diagnosis not present

## 2014-07-18 DIAGNOSIS — T82858D Stenosis of vascular prosthetic devices, implants and grafts, subsequent encounter: Secondary | ICD-10-CM | POA: Diagnosis not present

## 2014-07-18 DIAGNOSIS — E1129 Type 2 diabetes mellitus with other diabetic kidney complication: Secondary | ICD-10-CM | POA: Diagnosis not present

## 2014-07-18 DIAGNOSIS — N186 End stage renal disease: Secondary | ICD-10-CM | POA: Diagnosis not present

## 2014-07-18 NOTE — Progress Notes (Signed)
Patient ID: Jamie LippsBetty G [name], female   DOB: 06/16/1932, 81 y.o.   MRN: [id number]  Subjective: 81 year old female returns the office they for follow-up evaluation of ulceration and infection to the right foot. She states that since last appointment. Does not have as much pain as it did prior. She does that there is been some clear to bloody drainage however she denies any palms coming from the area. Denies any surrounding erythema or red streaking. Denies any malodor. She's been continuing daily dressing changes. No other complaints at this time. Denies any systemic complaints as fevers, chills, nausea, vomiting.  Objective: AAO 3, NAD DP/PT pulses decreased bilaterally Decreased protective sensation with Sims Wiesner monofilament, decreased vibratory sensation, Achilles tendon reflex intact. Right submetatarsal 5 ulceration measuring 1.2 x 1.1 cm with a granular wound base. There is slight hyperkeratotic periwound. There is no probing, undermining, tunneling and the wound appears to be superficial. There is no surrounding erythema, ascending cellulitis. There is no areas of fluctuance or crepitus or malodor. There is no drainage or purulence. No other open lesions or identifiable bilaterally. No other areas of tenderness to bilateral lower extremity. No pain with calf compression, swelling, warmth, erythema.  Wound Culture right submetatarsal 5 07/10/14 revealed multiple organisms present with none predominant, no staphylococcus aureus isolated, no group A strep (S. Pyogenes) isolated.   Assessment: 81 year old female follow up with evaluation of right foot submetatarsal 5 ulceration, with resolving infection  Plan: -Treatment options discussed include alternatives, risks, complications. -Continue daily dressing changes as discussed. Currently the wound does appear to be improved compared to last appointment and currently there is no clinical signs of infection. -She has an appointment a follow-up with  the wound care center on Monday, March 14. If they are unable to treat the wound now on the right foot to follow-up with me in 1 week or sooner if any palms are to arise. Otherwise follow-up in 2 months for routine care. In the meantime encouraged to call the office with any questions, concerns, change in symptoms.

## 2014-07-20 DIAGNOSIS — N186 End stage renal disease: Secondary | ICD-10-CM | POA: Diagnosis not present

## 2014-07-20 DIAGNOSIS — N2581 Secondary hyperparathyroidism of renal origin: Secondary | ICD-10-CM | POA: Diagnosis not present

## 2014-07-20 DIAGNOSIS — D631 Anemia in chronic kidney disease: Secondary | ICD-10-CM | POA: Diagnosis not present

## 2014-07-20 DIAGNOSIS — E1129 Type 2 diabetes mellitus with other diabetic kidney complication: Secondary | ICD-10-CM | POA: Diagnosis not present

## 2014-07-22 ENCOUNTER — Encounter (HOSPITAL_BASED_OUTPATIENT_CLINIC_OR_DEPARTMENT_OTHER): Payer: Medicare Other | Attending: Plastic Surgery

## 2014-07-22 DIAGNOSIS — L97521 Non-pressure chronic ulcer of other part of left foot limited to breakdown of skin: Secondary | ICD-10-CM | POA: Diagnosis not present

## 2014-07-22 DIAGNOSIS — E11621 Type 2 diabetes mellitus with foot ulcer: Secondary | ICD-10-CM | POA: Insufficient documentation

## 2014-07-23 DIAGNOSIS — D631 Anemia in chronic kidney disease: Secondary | ICD-10-CM | POA: Diagnosis not present

## 2014-07-23 DIAGNOSIS — N2581 Secondary hyperparathyroidism of renal origin: Secondary | ICD-10-CM | POA: Diagnosis not present

## 2014-07-23 DIAGNOSIS — N186 End stage renal disease: Secondary | ICD-10-CM | POA: Diagnosis not present

## 2014-07-23 DIAGNOSIS — E1129 Type 2 diabetes mellitus with other diabetic kidney complication: Secondary | ICD-10-CM | POA: Diagnosis not present

## 2014-07-25 DIAGNOSIS — N2581 Secondary hyperparathyroidism of renal origin: Secondary | ICD-10-CM | POA: Diagnosis not present

## 2014-07-25 DIAGNOSIS — D631 Anemia in chronic kidney disease: Secondary | ICD-10-CM | POA: Diagnosis not present

## 2014-07-25 DIAGNOSIS — N186 End stage renal disease: Secondary | ICD-10-CM | POA: Diagnosis not present

## 2014-07-25 DIAGNOSIS — E1129 Type 2 diabetes mellitus with other diabetic kidney complication: Secondary | ICD-10-CM | POA: Diagnosis not present

## 2014-07-26 DIAGNOSIS — R159 Full incontinence of feces: Secondary | ICD-10-CM | POA: Diagnosis not present

## 2014-07-26 DIAGNOSIS — R197 Diarrhea, unspecified: Secondary | ICD-10-CM | POA: Diagnosis not present

## 2014-07-26 DIAGNOSIS — E1122 Type 2 diabetes mellitus with diabetic chronic kidney disease: Secondary | ICD-10-CM | POA: Diagnosis not present

## 2014-07-27 DIAGNOSIS — D631 Anemia in chronic kidney disease: Secondary | ICD-10-CM | POA: Diagnosis not present

## 2014-07-27 DIAGNOSIS — N186 End stage renal disease: Secondary | ICD-10-CM | POA: Diagnosis not present

## 2014-07-27 DIAGNOSIS — E1129 Type 2 diabetes mellitus with other diabetic kidney complication: Secondary | ICD-10-CM | POA: Diagnosis not present

## 2014-07-27 DIAGNOSIS — N2581 Secondary hyperparathyroidism of renal origin: Secondary | ICD-10-CM | POA: Diagnosis not present

## 2014-07-30 DIAGNOSIS — D631 Anemia in chronic kidney disease: Secondary | ICD-10-CM | POA: Diagnosis not present

## 2014-07-30 DIAGNOSIS — N186 End stage renal disease: Secondary | ICD-10-CM | POA: Diagnosis not present

## 2014-07-30 DIAGNOSIS — N2581 Secondary hyperparathyroidism of renal origin: Secondary | ICD-10-CM | POA: Diagnosis not present

## 2014-07-30 DIAGNOSIS — E1129 Type 2 diabetes mellitus with other diabetic kidney complication: Secondary | ICD-10-CM | POA: Diagnosis not present

## 2014-07-31 ENCOUNTER — Ambulatory Visit: Payer: Medicare Other | Admitting: Cardiovascular Disease

## 2014-08-01 DIAGNOSIS — E1129 Type 2 diabetes mellitus with other diabetic kidney complication: Secondary | ICD-10-CM | POA: Diagnosis not present

## 2014-08-01 DIAGNOSIS — D631 Anemia in chronic kidney disease: Secondary | ICD-10-CM | POA: Diagnosis not present

## 2014-08-01 DIAGNOSIS — N2581 Secondary hyperparathyroidism of renal origin: Secondary | ICD-10-CM | POA: Diagnosis not present

## 2014-08-01 DIAGNOSIS — N186 End stage renal disease: Secondary | ICD-10-CM | POA: Diagnosis not present

## 2014-08-03 DIAGNOSIS — E1129 Type 2 diabetes mellitus with other diabetic kidney complication: Secondary | ICD-10-CM | POA: Diagnosis not present

## 2014-08-03 DIAGNOSIS — N186 End stage renal disease: Secondary | ICD-10-CM | POA: Diagnosis not present

## 2014-08-03 DIAGNOSIS — D631 Anemia in chronic kidney disease: Secondary | ICD-10-CM | POA: Diagnosis not present

## 2014-08-03 DIAGNOSIS — N2581 Secondary hyperparathyroidism of renal origin: Secondary | ICD-10-CM | POA: Diagnosis not present

## 2014-08-06 DIAGNOSIS — N186 End stage renal disease: Secondary | ICD-10-CM | POA: Diagnosis not present

## 2014-08-06 DIAGNOSIS — D631 Anemia in chronic kidney disease: Secondary | ICD-10-CM | POA: Diagnosis not present

## 2014-08-06 DIAGNOSIS — E1129 Type 2 diabetes mellitus with other diabetic kidney complication: Secondary | ICD-10-CM | POA: Diagnosis not present

## 2014-08-06 DIAGNOSIS — N2581 Secondary hyperparathyroidism of renal origin: Secondary | ICD-10-CM | POA: Diagnosis not present

## 2014-08-08 DIAGNOSIS — Z992 Dependence on renal dialysis: Secondary | ICD-10-CM | POA: Diagnosis not present

## 2014-08-08 DIAGNOSIS — E1129 Type 2 diabetes mellitus with other diabetic kidney complication: Secondary | ICD-10-CM | POA: Diagnosis not present

## 2014-08-08 DIAGNOSIS — D631 Anemia in chronic kidney disease: Secondary | ICD-10-CM | POA: Diagnosis not present

## 2014-08-08 DIAGNOSIS — N186 End stage renal disease: Secondary | ICD-10-CM | POA: Diagnosis not present

## 2014-08-08 DIAGNOSIS — N2581 Secondary hyperparathyroidism of renal origin: Secondary | ICD-10-CM | POA: Diagnosis not present

## 2014-08-10 DIAGNOSIS — D631 Anemia in chronic kidney disease: Secondary | ICD-10-CM | POA: Diagnosis not present

## 2014-08-10 DIAGNOSIS — N2581 Secondary hyperparathyroidism of renal origin: Secondary | ICD-10-CM | POA: Diagnosis not present

## 2014-08-10 DIAGNOSIS — N186 End stage renal disease: Secondary | ICD-10-CM | POA: Diagnosis not present

## 2014-08-10 DIAGNOSIS — E1129 Type 2 diabetes mellitus with other diabetic kidney complication: Secondary | ICD-10-CM | POA: Diagnosis not present

## 2014-08-12 ENCOUNTER — Encounter (HOSPITAL_BASED_OUTPATIENT_CLINIC_OR_DEPARTMENT_OTHER): Payer: Medicare Other | Attending: Plastic Surgery

## 2014-08-12 DIAGNOSIS — E11621 Type 2 diabetes mellitus with foot ulcer: Secondary | ICD-10-CM | POA: Diagnosis not present

## 2014-08-12 DIAGNOSIS — Z89412 Acquired absence of left great toe: Secondary | ICD-10-CM | POA: Insufficient documentation

## 2014-08-12 DIAGNOSIS — Z8739 Personal history of other diseases of the musculoskeletal system and connective tissue: Secondary | ICD-10-CM | POA: Diagnosis not present

## 2014-08-12 DIAGNOSIS — L84 Corns and callosities: Secondary | ICD-10-CM | POA: Insufficient documentation

## 2014-08-12 DIAGNOSIS — L97521 Non-pressure chronic ulcer of other part of left foot limited to breakdown of skin: Secondary | ICD-10-CM | POA: Diagnosis not present

## 2014-08-13 DIAGNOSIS — D631 Anemia in chronic kidney disease: Secondary | ICD-10-CM | POA: Diagnosis not present

## 2014-08-13 DIAGNOSIS — N186 End stage renal disease: Secondary | ICD-10-CM | POA: Diagnosis not present

## 2014-08-13 DIAGNOSIS — N2581 Secondary hyperparathyroidism of renal origin: Secondary | ICD-10-CM | POA: Diagnosis not present

## 2014-08-13 DIAGNOSIS — E1129 Type 2 diabetes mellitus with other diabetic kidney complication: Secondary | ICD-10-CM | POA: Diagnosis not present

## 2014-08-15 DIAGNOSIS — N2581 Secondary hyperparathyroidism of renal origin: Secondary | ICD-10-CM | POA: Diagnosis not present

## 2014-08-15 DIAGNOSIS — E1129 Type 2 diabetes mellitus with other diabetic kidney complication: Secondary | ICD-10-CM | POA: Diagnosis not present

## 2014-08-15 DIAGNOSIS — D631 Anemia in chronic kidney disease: Secondary | ICD-10-CM | POA: Diagnosis not present

## 2014-08-15 DIAGNOSIS — N186 End stage renal disease: Secondary | ICD-10-CM | POA: Diagnosis not present

## 2014-08-17 DIAGNOSIS — N2581 Secondary hyperparathyroidism of renal origin: Secondary | ICD-10-CM | POA: Diagnosis not present

## 2014-08-17 DIAGNOSIS — N186 End stage renal disease: Secondary | ICD-10-CM | POA: Diagnosis not present

## 2014-08-17 DIAGNOSIS — E1129 Type 2 diabetes mellitus with other diabetic kidney complication: Secondary | ICD-10-CM | POA: Diagnosis not present

## 2014-08-17 DIAGNOSIS — D631 Anemia in chronic kidney disease: Secondary | ICD-10-CM | POA: Diagnosis not present

## 2014-08-20 DIAGNOSIS — N2581 Secondary hyperparathyroidism of renal origin: Secondary | ICD-10-CM | POA: Diagnosis not present

## 2014-08-20 DIAGNOSIS — H2513 Age-related nuclear cataract, bilateral: Secondary | ICD-10-CM | POA: Diagnosis not present

## 2014-08-20 DIAGNOSIS — D631 Anemia in chronic kidney disease: Secondary | ICD-10-CM | POA: Diagnosis not present

## 2014-08-20 DIAGNOSIS — E1129 Type 2 diabetes mellitus with other diabetic kidney complication: Secondary | ICD-10-CM | POA: Diagnosis not present

## 2014-08-20 DIAGNOSIS — N186 End stage renal disease: Secondary | ICD-10-CM | POA: Diagnosis not present

## 2014-08-22 DIAGNOSIS — N2581 Secondary hyperparathyroidism of renal origin: Secondary | ICD-10-CM | POA: Diagnosis not present

## 2014-08-22 DIAGNOSIS — E1129 Type 2 diabetes mellitus with other diabetic kidney complication: Secondary | ICD-10-CM | POA: Diagnosis not present

## 2014-08-22 DIAGNOSIS — N186 End stage renal disease: Secondary | ICD-10-CM | POA: Diagnosis not present

## 2014-08-22 DIAGNOSIS — D631 Anemia in chronic kidney disease: Secondary | ICD-10-CM | POA: Diagnosis not present

## 2014-08-24 DIAGNOSIS — E1129 Type 2 diabetes mellitus with other diabetic kidney complication: Secondary | ICD-10-CM | POA: Diagnosis not present

## 2014-08-24 DIAGNOSIS — N186 End stage renal disease: Secondary | ICD-10-CM | POA: Diagnosis not present

## 2014-08-24 DIAGNOSIS — D631 Anemia in chronic kidney disease: Secondary | ICD-10-CM | POA: Diagnosis not present

## 2014-08-24 DIAGNOSIS — N2581 Secondary hyperparathyroidism of renal origin: Secondary | ICD-10-CM | POA: Diagnosis not present

## 2014-08-26 DIAGNOSIS — Z89412 Acquired absence of left great toe: Secondary | ICD-10-CM | POA: Diagnosis not present

## 2014-08-26 DIAGNOSIS — E11621 Type 2 diabetes mellitus with foot ulcer: Secondary | ICD-10-CM | POA: Diagnosis not present

## 2014-08-26 DIAGNOSIS — L84 Corns and callosities: Secondary | ICD-10-CM | POA: Diagnosis not present

## 2014-08-26 DIAGNOSIS — L97521 Non-pressure chronic ulcer of other part of left foot limited to breakdown of skin: Secondary | ICD-10-CM | POA: Diagnosis not present

## 2014-08-26 DIAGNOSIS — Z8739 Personal history of other diseases of the musculoskeletal system and connective tissue: Secondary | ICD-10-CM | POA: Diagnosis not present

## 2014-08-27 DIAGNOSIS — N186 End stage renal disease: Secondary | ICD-10-CM | POA: Diagnosis not present

## 2014-08-27 DIAGNOSIS — E1129 Type 2 diabetes mellitus with other diabetic kidney complication: Secondary | ICD-10-CM | POA: Diagnosis not present

## 2014-08-27 DIAGNOSIS — D631 Anemia in chronic kidney disease: Secondary | ICD-10-CM | POA: Diagnosis not present

## 2014-08-27 DIAGNOSIS — N2581 Secondary hyperparathyroidism of renal origin: Secondary | ICD-10-CM | POA: Diagnosis not present

## 2014-08-28 DIAGNOSIS — E8779 Other fluid overload: Secondary | ICD-10-CM | POA: Diagnosis not present

## 2014-08-28 DIAGNOSIS — N186 End stage renal disease: Secondary | ICD-10-CM | POA: Diagnosis not present

## 2014-08-29 DIAGNOSIS — E1129 Type 2 diabetes mellitus with other diabetic kidney complication: Secondary | ICD-10-CM | POA: Diagnosis not present

## 2014-08-29 DIAGNOSIS — D631 Anemia in chronic kidney disease: Secondary | ICD-10-CM | POA: Diagnosis not present

## 2014-08-29 DIAGNOSIS — N186 End stage renal disease: Secondary | ICD-10-CM | POA: Diagnosis not present

## 2014-08-29 DIAGNOSIS — N2581 Secondary hyperparathyroidism of renal origin: Secondary | ICD-10-CM | POA: Diagnosis not present

## 2014-08-31 DIAGNOSIS — D631 Anemia in chronic kidney disease: Secondary | ICD-10-CM | POA: Diagnosis not present

## 2014-08-31 DIAGNOSIS — N2581 Secondary hyperparathyroidism of renal origin: Secondary | ICD-10-CM | POA: Diagnosis not present

## 2014-08-31 DIAGNOSIS — E1129 Type 2 diabetes mellitus with other diabetic kidney complication: Secondary | ICD-10-CM | POA: Diagnosis not present

## 2014-08-31 DIAGNOSIS — N186 End stage renal disease: Secondary | ICD-10-CM | POA: Diagnosis not present

## 2014-09-03 DIAGNOSIS — N2581 Secondary hyperparathyroidism of renal origin: Secondary | ICD-10-CM | POA: Diagnosis not present

## 2014-09-03 DIAGNOSIS — D631 Anemia in chronic kidney disease: Secondary | ICD-10-CM | POA: Diagnosis not present

## 2014-09-03 DIAGNOSIS — E1129 Type 2 diabetes mellitus with other diabetic kidney complication: Secondary | ICD-10-CM | POA: Diagnosis not present

## 2014-09-03 DIAGNOSIS — N186 End stage renal disease: Secondary | ICD-10-CM | POA: Diagnosis not present

## 2014-09-04 DIAGNOSIS — N2581 Secondary hyperparathyroidism of renal origin: Secondary | ICD-10-CM | POA: Diagnosis not present

## 2014-09-04 DIAGNOSIS — N186 End stage renal disease: Secondary | ICD-10-CM | POA: Diagnosis not present

## 2014-09-04 DIAGNOSIS — E1129 Type 2 diabetes mellitus with other diabetic kidney complication: Secondary | ICD-10-CM | POA: Diagnosis not present

## 2014-09-04 DIAGNOSIS — D631 Anemia in chronic kidney disease: Secondary | ICD-10-CM | POA: Diagnosis not present

## 2014-09-05 DIAGNOSIS — H25812 Combined forms of age-related cataract, left eye: Secondary | ICD-10-CM | POA: Diagnosis not present

## 2014-09-05 DIAGNOSIS — H2512 Age-related nuclear cataract, left eye: Secondary | ICD-10-CM | POA: Diagnosis not present

## 2014-09-07 DIAGNOSIS — N2581 Secondary hyperparathyroidism of renal origin: Secondary | ICD-10-CM | POA: Diagnosis not present

## 2014-09-07 DIAGNOSIS — E1129 Type 2 diabetes mellitus with other diabetic kidney complication: Secondary | ICD-10-CM | POA: Diagnosis not present

## 2014-09-07 DIAGNOSIS — Z992 Dependence on renal dialysis: Secondary | ICD-10-CM | POA: Diagnosis not present

## 2014-09-07 DIAGNOSIS — N186 End stage renal disease: Secondary | ICD-10-CM | POA: Diagnosis not present

## 2014-09-07 DIAGNOSIS — D631 Anemia in chronic kidney disease: Secondary | ICD-10-CM | POA: Diagnosis not present

## 2014-09-09 ENCOUNTER — Encounter (HOSPITAL_BASED_OUTPATIENT_CLINIC_OR_DEPARTMENT_OTHER): Payer: Medicare Other | Attending: Plastic Surgery

## 2014-09-09 DIAGNOSIS — Z89412 Acquired absence of left great toe: Secondary | ICD-10-CM | POA: Diagnosis not present

## 2014-09-09 DIAGNOSIS — Z8739 Personal history of other diseases of the musculoskeletal system and connective tissue: Secondary | ICD-10-CM | POA: Insufficient documentation

## 2014-09-09 DIAGNOSIS — E11621 Type 2 diabetes mellitus with foot ulcer: Secondary | ICD-10-CM | POA: Insufficient documentation

## 2014-09-09 DIAGNOSIS — L97521 Non-pressure chronic ulcer of other part of left foot limited to breakdown of skin: Secondary | ICD-10-CM | POA: Insufficient documentation

## 2014-09-09 LAB — GLUCOSE, CAPILLARY: Glucose-Capillary: 109 mg/dL — ABNORMAL HIGH (ref 70–99)

## 2014-09-10 DIAGNOSIS — N186 End stage renal disease: Secondary | ICD-10-CM | POA: Diagnosis not present

## 2014-09-10 DIAGNOSIS — D631 Anemia in chronic kidney disease: Secondary | ICD-10-CM | POA: Diagnosis not present

## 2014-09-10 DIAGNOSIS — N2581 Secondary hyperparathyroidism of renal origin: Secondary | ICD-10-CM | POA: Diagnosis not present

## 2014-09-12 DIAGNOSIS — D631 Anemia in chronic kidney disease: Secondary | ICD-10-CM | POA: Diagnosis not present

## 2014-09-12 DIAGNOSIS — N2581 Secondary hyperparathyroidism of renal origin: Secondary | ICD-10-CM | POA: Diagnosis not present

## 2014-09-12 DIAGNOSIS — N186 End stage renal disease: Secondary | ICD-10-CM | POA: Diagnosis not present

## 2014-09-14 DIAGNOSIS — N2581 Secondary hyperparathyroidism of renal origin: Secondary | ICD-10-CM | POA: Diagnosis not present

## 2014-09-14 DIAGNOSIS — D631 Anemia in chronic kidney disease: Secondary | ICD-10-CM | POA: Diagnosis not present

## 2014-09-14 DIAGNOSIS — N186 End stage renal disease: Secondary | ICD-10-CM | POA: Diagnosis not present

## 2014-09-17 DIAGNOSIS — N2581 Secondary hyperparathyroidism of renal origin: Secondary | ICD-10-CM | POA: Diagnosis not present

## 2014-09-17 DIAGNOSIS — D631 Anemia in chronic kidney disease: Secondary | ICD-10-CM | POA: Diagnosis not present

## 2014-09-17 DIAGNOSIS — N186 End stage renal disease: Secondary | ICD-10-CM | POA: Diagnosis not present

## 2014-09-18 ENCOUNTER — Encounter: Payer: Self-pay | Admitting: Podiatry

## 2014-09-18 ENCOUNTER — Ambulatory Visit (INDEPENDENT_AMBULATORY_CARE_PROVIDER_SITE_OTHER): Payer: Medicare Other | Admitting: Podiatry

## 2014-09-18 VITALS — BP 162/75 | HR 75 | Resp 18

## 2014-09-18 DIAGNOSIS — L98499 Non-pressure chronic ulcer of skin of other sites with unspecified severity: Secondary | ICD-10-CM

## 2014-09-18 DIAGNOSIS — L84 Corns and callosities: Secondary | ICD-10-CM | POA: Diagnosis not present

## 2014-09-18 DIAGNOSIS — Z89412 Acquired absence of left great toe: Secondary | ICD-10-CM | POA: Diagnosis not present

## 2014-09-18 DIAGNOSIS — E1151 Type 2 diabetes mellitus with diabetic peripheral angiopathy without gangrene: Secondary | ICD-10-CM

## 2014-09-18 DIAGNOSIS — E1159 Type 2 diabetes mellitus with other circulatory complications: Secondary | ICD-10-CM

## 2014-09-18 DIAGNOSIS — I739 Peripheral vascular disease, unspecified: Secondary | ICD-10-CM

## 2014-09-18 DIAGNOSIS — B351 Tinea unguium: Secondary | ICD-10-CM

## 2014-09-18 DIAGNOSIS — L97524 Non-pressure chronic ulcer of other part of left foot with necrosis of bone: Secondary | ICD-10-CM

## 2014-09-18 DIAGNOSIS — I7025 Atherosclerosis of native arteries of other extremities with ulceration: Secondary | ICD-10-CM

## 2014-09-18 NOTE — Patient Instructions (Signed)

## 2014-09-18 NOTE — Progress Notes (Signed)
Patient ID: Jamie LippsBetty G [name], female   DOB: 07/27/1932, 81 y.o.   MRN: [id number]  Subjective: 81 y.o.-year-old female returns the office today to pick up diabetic shoes. Also while here she would like to have her feet look that due to the history of ulceration and calluses. She states that her nails continue be thickened and discolored which caused irritation shoe gear and she is unable to trim them herself. She also states that she has calluses mostly over the right foot. She states of the wound on the right foot is healed and she continues to follow up with the wound care center for the left hallux every 2 weeks. She denies any recent swelling to her feet are denies any redness or drainage from the wound sites.  Denies any acute changes since last appointment and no new complaints today. Denies any systemic complaints such as fevers, chills, nausea, vomiting.   Objective: AAO 3, NAD DP/PT pulses decreased Protective sensation decreased with Simms Weinstein monofilament, Achilles tendon reflex intact.  Nails hypertrophic, dystrophic, elongated, brittle, discolored 9.There is no surrounding erythema or drainage along the nail sites. Right foot submetatarsal 5 hyperkeratotic lesion. Upon debridement lesion there is no underlying ulceration, drainage or other clinical signs of infection. The left hallux appears to be significantly improved compared to prior evaluations and there is only a small ulceration of the distal aspect of the hallux. There is no surrounding erythema, drainage, ascending cellulitis, fluctuance, crepitus. No open lesions or other pre-ulcerative lesions are identified. No pain with calf compression, swelling, warmth, erythema.  Assessment: Patient presents with history of bilateral ulcerations, onychomycosis, pre-ulcerative calluses  Plan: -Treatment options including alternatives, risks, complications were discussed -Nails sharply debrided 9 without  complication/bleeding. -Hyperkeratotic lesion sub-debrided without complication/bleeding -Will defer to the wound care center for the left hallux. -Discussed daily foot inspection. If there are any changes, to call the office immediately.  -Follow-up in 3 months or sooner if any problems are to arise. In the meantime, encouraged to call the office with any questions, concerns, changes symptoms.   PATIENT PRESENTS FOR DIABETIC SHOE PICK UP SHOES ARE TRIED ON FOR GOOD FIT.  PATIENT RECEIVED 1 PAIR HUSHPUPPIES POWER WALKER 2 DOUBLE STRAP IN WHITE WOMEN'S SIZE 10 WIDE AND 3 PAIRS CUSTOM DIABETIC INSERTS.  WRITTEN BREAK IN AND WEAR INSTRUCTIONS GIVEN.

## 2014-09-19 DIAGNOSIS — N186 End stage renal disease: Secondary | ICD-10-CM | POA: Diagnosis not present

## 2014-09-19 DIAGNOSIS — D631 Anemia in chronic kidney disease: Secondary | ICD-10-CM | POA: Diagnosis not present

## 2014-09-19 DIAGNOSIS — N2581 Secondary hyperparathyroidism of renal origin: Secondary | ICD-10-CM | POA: Diagnosis not present

## 2014-09-21 DIAGNOSIS — N186 End stage renal disease: Secondary | ICD-10-CM | POA: Diagnosis not present

## 2014-09-21 DIAGNOSIS — N2581 Secondary hyperparathyroidism of renal origin: Secondary | ICD-10-CM | POA: Diagnosis not present

## 2014-09-21 DIAGNOSIS — D631 Anemia in chronic kidney disease: Secondary | ICD-10-CM | POA: Diagnosis not present

## 2014-09-23 DIAGNOSIS — Z89412 Acquired absence of left great toe: Secondary | ICD-10-CM | POA: Diagnosis not present

## 2014-09-23 DIAGNOSIS — Z8739 Personal history of other diseases of the musculoskeletal system and connective tissue: Secondary | ICD-10-CM | POA: Diagnosis not present

## 2014-09-23 DIAGNOSIS — E11621 Type 2 diabetes mellitus with foot ulcer: Secondary | ICD-10-CM | POA: Diagnosis not present

## 2014-09-23 DIAGNOSIS — L97521 Non-pressure chronic ulcer of other part of left foot limited to breakdown of skin: Secondary | ICD-10-CM | POA: Diagnosis not present

## 2014-09-24 DIAGNOSIS — N186 End stage renal disease: Secondary | ICD-10-CM | POA: Diagnosis not present

## 2014-09-24 DIAGNOSIS — N2581 Secondary hyperparathyroidism of renal origin: Secondary | ICD-10-CM | POA: Diagnosis not present

## 2014-09-24 DIAGNOSIS — D631 Anemia in chronic kidney disease: Secondary | ICD-10-CM | POA: Diagnosis not present

## 2014-09-26 DIAGNOSIS — T82858D Stenosis of vascular prosthetic devices, implants and grafts, subsequent encounter: Secondary | ICD-10-CM | POA: Diagnosis not present

## 2014-09-26 DIAGNOSIS — N2581 Secondary hyperparathyroidism of renal origin: Secondary | ICD-10-CM | POA: Diagnosis not present

## 2014-09-26 DIAGNOSIS — Z992 Dependence on renal dialysis: Secondary | ICD-10-CM | POA: Diagnosis not present

## 2014-09-26 DIAGNOSIS — N186 End stage renal disease: Secondary | ICD-10-CM | POA: Diagnosis not present

## 2014-09-26 DIAGNOSIS — D631 Anemia in chronic kidney disease: Secondary | ICD-10-CM | POA: Diagnosis not present

## 2014-09-26 DIAGNOSIS — I871 Compression of vein: Secondary | ICD-10-CM | POA: Diagnosis not present

## 2014-09-28 DIAGNOSIS — N186 End stage renal disease: Secondary | ICD-10-CM | POA: Diagnosis not present

## 2014-09-28 DIAGNOSIS — N2581 Secondary hyperparathyroidism of renal origin: Secondary | ICD-10-CM | POA: Diagnosis not present

## 2014-09-28 DIAGNOSIS — D631 Anemia in chronic kidney disease: Secondary | ICD-10-CM | POA: Diagnosis not present

## 2014-10-01 DIAGNOSIS — N2581 Secondary hyperparathyroidism of renal origin: Secondary | ICD-10-CM | POA: Diagnosis not present

## 2014-10-01 DIAGNOSIS — N186 End stage renal disease: Secondary | ICD-10-CM | POA: Diagnosis not present

## 2014-10-01 DIAGNOSIS — D631 Anemia in chronic kidney disease: Secondary | ICD-10-CM | POA: Diagnosis not present

## 2014-10-03 DIAGNOSIS — N2581 Secondary hyperparathyroidism of renal origin: Secondary | ICD-10-CM | POA: Diagnosis not present

## 2014-10-03 DIAGNOSIS — N186 End stage renal disease: Secondary | ICD-10-CM | POA: Diagnosis not present

## 2014-10-03 DIAGNOSIS — D631 Anemia in chronic kidney disease: Secondary | ICD-10-CM | POA: Diagnosis not present

## 2014-10-05 DIAGNOSIS — N186 End stage renal disease: Secondary | ICD-10-CM | POA: Diagnosis not present

## 2014-10-05 DIAGNOSIS — D631 Anemia in chronic kidney disease: Secondary | ICD-10-CM | POA: Diagnosis not present

## 2014-10-05 DIAGNOSIS — N2581 Secondary hyperparathyroidism of renal origin: Secondary | ICD-10-CM | POA: Diagnosis not present

## 2014-10-08 DIAGNOSIS — E1129 Type 2 diabetes mellitus with other diabetic kidney complication: Secondary | ICD-10-CM | POA: Diagnosis not present

## 2014-10-08 DIAGNOSIS — N186 End stage renal disease: Secondary | ICD-10-CM | POA: Diagnosis not present

## 2014-10-08 DIAGNOSIS — N2581 Secondary hyperparathyroidism of renal origin: Secondary | ICD-10-CM | POA: Diagnosis not present

## 2014-10-08 DIAGNOSIS — Z992 Dependence on renal dialysis: Secondary | ICD-10-CM | POA: Diagnosis not present

## 2014-10-08 DIAGNOSIS — D631 Anemia in chronic kidney disease: Secondary | ICD-10-CM | POA: Diagnosis not present

## 2014-10-10 DIAGNOSIS — I871 Compression of vein: Secondary | ICD-10-CM | POA: Diagnosis not present

## 2014-10-10 DIAGNOSIS — E1129 Type 2 diabetes mellitus with other diabetic kidney complication: Secondary | ICD-10-CM | POA: Diagnosis not present

## 2014-10-10 DIAGNOSIS — Z992 Dependence on renal dialysis: Secondary | ICD-10-CM | POA: Diagnosis not present

## 2014-10-10 DIAGNOSIS — T82858D Stenosis of vascular prosthetic devices, implants and grafts, subsequent encounter: Secondary | ICD-10-CM | POA: Diagnosis not present

## 2014-10-10 DIAGNOSIS — N186 End stage renal disease: Secondary | ICD-10-CM | POA: Diagnosis not present

## 2014-10-10 DIAGNOSIS — N2581 Secondary hyperparathyroidism of renal origin: Secondary | ICD-10-CM | POA: Diagnosis not present

## 2014-10-12 DIAGNOSIS — N186 End stage renal disease: Secondary | ICD-10-CM | POA: Diagnosis not present

## 2014-10-12 DIAGNOSIS — E1129 Type 2 diabetes mellitus with other diabetic kidney complication: Secondary | ICD-10-CM | POA: Diagnosis not present

## 2014-10-12 DIAGNOSIS — N2581 Secondary hyperparathyroidism of renal origin: Secondary | ICD-10-CM | POA: Diagnosis not present

## 2014-10-15 DIAGNOSIS — N2581 Secondary hyperparathyroidism of renal origin: Secondary | ICD-10-CM | POA: Diagnosis not present

## 2014-10-15 DIAGNOSIS — N186 End stage renal disease: Secondary | ICD-10-CM | POA: Diagnosis not present

## 2014-10-15 DIAGNOSIS — E1129 Type 2 diabetes mellitus with other diabetic kidney complication: Secondary | ICD-10-CM | POA: Diagnosis not present

## 2014-10-17 DIAGNOSIS — N186 End stage renal disease: Secondary | ICD-10-CM | POA: Diagnosis not present

## 2014-10-17 DIAGNOSIS — E1129 Type 2 diabetes mellitus with other diabetic kidney complication: Secondary | ICD-10-CM | POA: Diagnosis not present

## 2014-10-17 DIAGNOSIS — N2581 Secondary hyperparathyroidism of renal origin: Secondary | ICD-10-CM | POA: Diagnosis not present

## 2014-10-19 DIAGNOSIS — N2581 Secondary hyperparathyroidism of renal origin: Secondary | ICD-10-CM | POA: Diagnosis not present

## 2014-10-19 DIAGNOSIS — E1129 Type 2 diabetes mellitus with other diabetic kidney complication: Secondary | ICD-10-CM | POA: Diagnosis not present

## 2014-10-19 DIAGNOSIS — N186 End stage renal disease: Secondary | ICD-10-CM | POA: Diagnosis not present

## 2014-10-22 DIAGNOSIS — N186 End stage renal disease: Secondary | ICD-10-CM | POA: Diagnosis not present

## 2014-10-22 DIAGNOSIS — N2581 Secondary hyperparathyroidism of renal origin: Secondary | ICD-10-CM | POA: Diagnosis not present

## 2014-10-22 DIAGNOSIS — E1129 Type 2 diabetes mellitus with other diabetic kidney complication: Secondary | ICD-10-CM | POA: Diagnosis not present

## 2014-10-24 DIAGNOSIS — N186 End stage renal disease: Secondary | ICD-10-CM | POA: Diagnosis not present

## 2014-10-24 DIAGNOSIS — N2581 Secondary hyperparathyroidism of renal origin: Secondary | ICD-10-CM | POA: Diagnosis not present

## 2014-10-24 DIAGNOSIS — E1129 Type 2 diabetes mellitus with other diabetic kidney complication: Secondary | ICD-10-CM | POA: Diagnosis not present

## 2014-10-26 DIAGNOSIS — E1129 Type 2 diabetes mellitus with other diabetic kidney complication: Secondary | ICD-10-CM | POA: Diagnosis not present

## 2014-10-26 DIAGNOSIS — N186 End stage renal disease: Secondary | ICD-10-CM | POA: Diagnosis not present

## 2014-10-26 DIAGNOSIS — N2581 Secondary hyperparathyroidism of renal origin: Secondary | ICD-10-CM | POA: Diagnosis not present

## 2014-10-29 DIAGNOSIS — E1129 Type 2 diabetes mellitus with other diabetic kidney complication: Secondary | ICD-10-CM | POA: Diagnosis not present

## 2014-10-29 DIAGNOSIS — N2581 Secondary hyperparathyroidism of renal origin: Secondary | ICD-10-CM | POA: Diagnosis not present

## 2014-10-29 DIAGNOSIS — N186 End stage renal disease: Secondary | ICD-10-CM | POA: Diagnosis not present

## 2014-10-30 ENCOUNTER — Ambulatory Visit (INDEPENDENT_AMBULATORY_CARE_PROVIDER_SITE_OTHER): Payer: Medicare Other | Admitting: Cardiovascular Disease

## 2014-10-30 ENCOUNTER — Encounter: Payer: Self-pay | Admitting: Cardiovascular Disease

## 2014-10-30 VITALS — BP 110/60 | HR 93 | Ht 63.0 in | Wt 188.0 lb

## 2014-10-30 DIAGNOSIS — L98499 Non-pressure chronic ulcer of skin of other sites with unspecified severity: Secondary | ICD-10-CM | POA: Diagnosis not present

## 2014-10-30 DIAGNOSIS — I739 Peripheral vascular disease, unspecified: Secondary | ICD-10-CM | POA: Diagnosis not present

## 2014-10-30 DIAGNOSIS — N2581 Secondary hyperparathyroidism of renal origin: Secondary | ICD-10-CM | POA: Diagnosis not present

## 2014-10-30 DIAGNOSIS — E1129 Type 2 diabetes mellitus with other diabetic kidney complication: Secondary | ICD-10-CM | POA: Diagnosis not present

## 2014-10-30 DIAGNOSIS — N186 End stage renal disease: Secondary | ICD-10-CM | POA: Diagnosis not present

## 2014-10-30 NOTE — Assessment & Plan Note (Signed)
[name] returns today for follow-up of critical limb ischemia. The ischemic ulcer on her left great toe has healed. She does have 0 vessel runoff bilaterally.  She is no longer symptomatic. I will see back when necessary.

## 2014-10-30 NOTE — Patient Instructions (Signed)
Follow up with Dr Berry as needed.  

## 2014-10-30 NOTE — Progress Notes (Signed)
10/30/2014 [name]   1932/09/15  [id number]  Primary Physician Emeterio Reeve, MD Primary Cardiologist: Runell Gess MD [name]   HPI:  [name] is a very pleasant 81year-old moderately overweight widowed [place] American female with no children who lives alone and is independent. I last saw her in the office 12/27/13. She is accompanied by her daughter today. Her primary care physician is Dr. Mila Palmer. She was referred by Dr. Charlsie Merles for peripheral vascular evaluation because of a mildly painful discolored left great toe. Her cardiovascular risk factor profile is only remarkable for diabetes. She has been hemodialysis dependent for 7 years. She's never had a heart attack or stroke, denies chest pain, shortness of breath or claudication. She did have a nonhealing wound on her left great toe last year which ultimately healed with aggressive local care. Lower extremity arterial Doppler studies performed in our office/3/15 revealed right ABI 1.1 and left of 0.75, was ordered which is slightly elevated because of vessel calcification. Her toe brachial indices were 0.32 on the right and. 27 on the left. She has zero vessel runoff bilaterally. Since I saw her last 10/26/13 she is to has been admitted to the hospital for a stroke. Since I saw her last year her left great toe ischemic ulcer has healed. There is no longer uncomfortable or painful. She is otherwise asymptomatic.   Current Outpatient Prescriptions  Medication Sig Dispense Refill  . acetaminophen (TYLENOL) 500 MG tablet Take 500-1,000 mg by mouth every 6 (six) hours as needed.    Marland Kitchen aspirin 325 MG EC tablet Take 325 mg by mouth daily.    Marland Kitchen doxycycline (MONODOX) 100 MG capsule Take 1 capsule by mouth 2 (two) times daily.    Marland Kitchen gabapentin (NEURONTIN) 100 MG capsule Take 100 mg by mouth 2 (two) times daily.     Marland Kitchen ketorolac (ACULAR) 0.5 % ophthalmic solution   0  . lanthanum (FOSRENOL) 1000 MG chewable tablet  Chew 1,000-2,000 mg by mouth 3 (three) times daily with meals. Take 2000 mg by mouth 3 times daily with meals and take 1000mg  by mouth with snacks.    . neomycin-polymyxin b-dexamethasone (MAXITROL) 3.5-10000-0.1 OINT   1  . omeprazole (PRILOSEC) 20 MG capsule Take 1 capsule (20 mg total) by mouth daily. 90 capsule 3  . prednisoLONE acetate (PRED FORTE) 1 % ophthalmic suspension   1  . PROAIR HFA 108 (90 BASE) MCG/ACT inhaler     . SENSIPAR 30 MG tablet Take 30 mg by mouth every evening.     . silver sulfADIAZINE (SILVADENE) 1 % cream Apply 1 application topically daily. 50 g 0  . traMADol (ULTRAM) 50 MG tablet Take 1 tablet (50 mg total) by mouth every 6 (six) hours as needed. 30 tablet 0  . VIGAMOX 0.5 % ophthalmic solution   1   No current facility-administered medications for this visit.    Allergies  Allergen Reactions  . Codeine Hives  . Vancomycin Rash    History   Social History  . Marital Status: Divorced    Spouse Name: N/A  . Number of Children: 0  . Years of Education: college   Occupational History  . retired    Social History Main Topics  . Smoking status: Former Smoker    Types: Cigarettes  . Smokeless tobacco: Never Used  . Alcohol Use: No  . Drug Use: No  . Sexual Activity: No   Other Topics Concern  . Not on  file   Social History Narrative     Review of Systems: General: negative for chills, fever, night sweats or weight changes.  Cardiovascular: negative for chest pain, dyspnea on exertion, edema, orthopnea, palpitations, paroxysmal nocturnal dyspnea or shortness of breath Dermatological: negative for rash Respiratory: negative for cough or wheezing Urologic: negative for hematuria Abdominal: negative for nausea, vomiting, diarrhea, bright red blood per rectum, melena, or hematemesis Neurologic: negative for visual changes, syncope, or dizziness All other systems reviewed and are otherwise negative except as noted above.    Blood pressure  110/60, pulse 93, height  (1.6 m), weight 188 lb (85.276 kg).  General appearance: alert and no distress Neck: no adenopathy, no carotid bruit, no JVD, supple, symmetrical, trachea midline and thyroid not enlarged, symmetric, no tenderness/mass/nodules Lungs: clear to auscultation bilaterally Heart: regular rate and rhythm, S1, S2 normal, no murmur, click, rub or gallop Extremities: extremities normal, atraumatic, no cyanosis or edema  EKG not performed today  ASSESSMENT AND PLAN:   Peripheral arterial disease [name] returns today for follow-up of critical limb ischemia. The ischemic ulcer on her left great toe has healed. She does have 0 vessel runoff bilaterally.  She is no longer symptomatic. I will see back when necessary.      Runell Gess MD FACP,FACC,FAHA, [facility] 10/30/2014 3:55 PM

## 2014-10-31 DIAGNOSIS — H2511 Age-related nuclear cataract, right eye: Secondary | ICD-10-CM | POA: Diagnosis not present

## 2014-10-31 DIAGNOSIS — H25011 Cortical age-related cataract, right eye: Secondary | ICD-10-CM | POA: Diagnosis not present

## 2014-10-31 DIAGNOSIS — H25041 Posterior subcapsular polar age-related cataract, right eye: Secondary | ICD-10-CM | POA: Diagnosis not present

## 2014-10-31 DIAGNOSIS — H25811 Combined forms of age-related cataract, right eye: Secondary | ICD-10-CM | POA: Diagnosis not present

## 2014-11-02 DIAGNOSIS — N186 End stage renal disease: Secondary | ICD-10-CM | POA: Diagnosis not present

## 2014-11-02 DIAGNOSIS — E1129 Type 2 diabetes mellitus with other diabetic kidney complication: Secondary | ICD-10-CM | POA: Diagnosis not present

## 2014-11-02 DIAGNOSIS — N2581 Secondary hyperparathyroidism of renal origin: Secondary | ICD-10-CM | POA: Diagnosis not present

## 2014-11-05 DIAGNOSIS — N2581 Secondary hyperparathyroidism of renal origin: Secondary | ICD-10-CM | POA: Diagnosis not present

## 2014-11-05 DIAGNOSIS — E1129 Type 2 diabetes mellitus with other diabetic kidney complication: Secondary | ICD-10-CM | POA: Diagnosis not present

## 2014-11-05 DIAGNOSIS — N186 End stage renal disease: Secondary | ICD-10-CM | POA: Diagnosis not present

## 2014-11-07 DIAGNOSIS — N2581 Secondary hyperparathyroidism of renal origin: Secondary | ICD-10-CM | POA: Diagnosis not present

## 2014-11-07 DIAGNOSIS — E1129 Type 2 diabetes mellitus with other diabetic kidney complication: Secondary | ICD-10-CM | POA: Diagnosis not present

## 2014-11-07 DIAGNOSIS — N186 End stage renal disease: Secondary | ICD-10-CM | POA: Diagnosis not present

## 2014-11-07 DIAGNOSIS — Z992 Dependence on renal dialysis: Secondary | ICD-10-CM | POA: Diagnosis not present

## 2014-11-09 DIAGNOSIS — D631 Anemia in chronic kidney disease: Secondary | ICD-10-CM | POA: Diagnosis not present

## 2014-11-09 DIAGNOSIS — E1129 Type 2 diabetes mellitus with other diabetic kidney complication: Secondary | ICD-10-CM | POA: Diagnosis not present

## 2014-11-09 DIAGNOSIS — N2581 Secondary hyperparathyroidism of renal origin: Secondary | ICD-10-CM | POA: Diagnosis not present

## 2014-11-09 DIAGNOSIS — N186 End stage renal disease: Secondary | ICD-10-CM | POA: Diagnosis not present

## 2014-11-12 DIAGNOSIS — N2581 Secondary hyperparathyroidism of renal origin: Secondary | ICD-10-CM | POA: Diagnosis not present

## 2014-11-12 DIAGNOSIS — N186 End stage renal disease: Secondary | ICD-10-CM | POA: Diagnosis not present

## 2014-11-12 DIAGNOSIS — E1129 Type 2 diabetes mellitus with other diabetic kidney complication: Secondary | ICD-10-CM | POA: Diagnosis not present

## 2014-11-12 DIAGNOSIS — D631 Anemia in chronic kidney disease: Secondary | ICD-10-CM | POA: Diagnosis not present

## 2014-11-14 DIAGNOSIS — E1129 Type 2 diabetes mellitus with other diabetic kidney complication: Secondary | ICD-10-CM | POA: Diagnosis not present

## 2014-11-14 DIAGNOSIS — N186 End stage renal disease: Secondary | ICD-10-CM | POA: Diagnosis not present

## 2014-11-14 DIAGNOSIS — N2581 Secondary hyperparathyroidism of renal origin: Secondary | ICD-10-CM | POA: Diagnosis not present

## 2014-11-14 DIAGNOSIS — D631 Anemia in chronic kidney disease: Secondary | ICD-10-CM | POA: Diagnosis not present

## 2014-11-16 DIAGNOSIS — E1129 Type 2 diabetes mellitus with other diabetic kidney complication: Secondary | ICD-10-CM | POA: Diagnosis not present

## 2014-11-16 DIAGNOSIS — N186 End stage renal disease: Secondary | ICD-10-CM | POA: Diagnosis not present

## 2014-11-16 DIAGNOSIS — N2581 Secondary hyperparathyroidism of renal origin: Secondary | ICD-10-CM | POA: Diagnosis not present

## 2014-11-16 DIAGNOSIS — D631 Anemia in chronic kidney disease: Secondary | ICD-10-CM | POA: Diagnosis not present

## 2014-11-19 DIAGNOSIS — N2581 Secondary hyperparathyroidism of renal origin: Secondary | ICD-10-CM | POA: Diagnosis not present

## 2014-11-19 DIAGNOSIS — N186 End stage renal disease: Secondary | ICD-10-CM | POA: Diagnosis not present

## 2014-11-19 DIAGNOSIS — E1129 Type 2 diabetes mellitus with other diabetic kidney complication: Secondary | ICD-10-CM | POA: Diagnosis not present

## 2014-11-19 DIAGNOSIS — D631 Anemia in chronic kidney disease: Secondary | ICD-10-CM | POA: Diagnosis not present

## 2014-11-21 DIAGNOSIS — Z992 Dependence on renal dialysis: Secondary | ICD-10-CM | POA: Diagnosis not present

## 2014-11-21 DIAGNOSIS — I871 Compression of vein: Secondary | ICD-10-CM | POA: Diagnosis not present

## 2014-11-21 DIAGNOSIS — T82858D Stenosis of vascular prosthetic devices, implants and grafts, subsequent encounter: Secondary | ICD-10-CM | POA: Diagnosis not present

## 2014-11-21 DIAGNOSIS — N186 End stage renal disease: Secondary | ICD-10-CM | POA: Diagnosis not present

## 2014-11-22 ENCOUNTER — Ambulatory Visit: Payer: Medicare Other | Admitting: Cardiovascular Disease

## 2014-11-22 DIAGNOSIS — D631 Anemia in chronic kidney disease: Secondary | ICD-10-CM | POA: Diagnosis not present

## 2014-11-22 DIAGNOSIS — N186 End stage renal disease: Secondary | ICD-10-CM | POA: Diagnosis not present

## 2014-11-22 DIAGNOSIS — N2581 Secondary hyperparathyroidism of renal origin: Secondary | ICD-10-CM | POA: Diagnosis not present

## 2014-11-22 DIAGNOSIS — E1129 Type 2 diabetes mellitus with other diabetic kidney complication: Secondary | ICD-10-CM | POA: Diagnosis not present

## 2014-11-23 DIAGNOSIS — N2581 Secondary hyperparathyroidism of renal origin: Secondary | ICD-10-CM | POA: Diagnosis not present

## 2014-11-23 DIAGNOSIS — E1129 Type 2 diabetes mellitus with other diabetic kidney complication: Secondary | ICD-10-CM | POA: Diagnosis not present

## 2014-11-23 DIAGNOSIS — D631 Anemia in chronic kidney disease: Secondary | ICD-10-CM | POA: Diagnosis not present

## 2014-11-23 DIAGNOSIS — N186 End stage renal disease: Secondary | ICD-10-CM | POA: Diagnosis not present

## 2014-11-25 ENCOUNTER — Encounter: Payer: Self-pay | Admitting: Vascular Surgery

## 2014-11-26 DIAGNOSIS — D631 Anemia in chronic kidney disease: Secondary | ICD-10-CM | POA: Diagnosis not present

## 2014-11-26 DIAGNOSIS — N186 End stage renal disease: Secondary | ICD-10-CM | POA: Diagnosis not present

## 2014-11-26 DIAGNOSIS — E1129 Type 2 diabetes mellitus with other diabetic kidney complication: Secondary | ICD-10-CM | POA: Diagnosis not present

## 2014-11-26 DIAGNOSIS — N2581 Secondary hyperparathyroidism of renal origin: Secondary | ICD-10-CM | POA: Diagnosis not present

## 2014-11-27 ENCOUNTER — Encounter: Payer: Self-pay | Admitting: Vascular Surgery

## 2014-11-27 ENCOUNTER — Ambulatory Visit (INDEPENDENT_AMBULATORY_CARE_PROVIDER_SITE_OTHER): Payer: Medicare Other | Admitting: Vascular Surgery

## 2014-11-27 VITALS — BP 160/74 | HR 84 | Temp 97.7°F | Resp 20 | Ht 61.0 in | Wt 195.0 lb

## 2014-11-27 DIAGNOSIS — L98499 Non-pressure chronic ulcer of skin of other sites with unspecified severity: Secondary | ICD-10-CM | POA: Diagnosis not present

## 2014-11-27 DIAGNOSIS — N186 End stage renal disease: Secondary | ICD-10-CM | POA: Diagnosis not present

## 2014-11-27 DIAGNOSIS — Z992 Dependence on renal dialysis: Secondary | ICD-10-CM | POA: Diagnosis not present

## 2014-11-27 DIAGNOSIS — I739 Peripheral vascular disease, unspecified: Secondary | ICD-10-CM | POA: Diagnosis not present

## 2014-11-27 NOTE — Progress Notes (Signed)
Established Dialysis Access  History of Present Illness  [name] is a 81 y.o. (12-16-1932) female who presents for re-evaluation of right thigh AVG.  Reported pt has had some clotting on HD and recently underwent R thigh shuntogram and venoplasty.  Patient notes her last HD run was without clotting with a green light.  Pt did not come with CD of her shuntogram.  Pt notes her prior CLI has resolved.  She denies any significant intermittent claudication as her ambulation is limited.  The patient's PMH, PSH, SH, and FamHx are unchanged from 01/10/14.  Current Outpatient Prescriptions  Medication Sig Dispense Refill  . acetaminophen (TYLENOL) 500 MG tablet Take 500-1,000 mg by mouth every 6 (six) hours as needed.    Marland Kitchen aspirin 325 MG EC tablet Take 325 mg by mouth daily.    Marland Kitchen doxycycline (MONODOX) 100 MG capsule Take 1 capsule by mouth 2 (two) times daily.    Marland Kitchen gabapentin (NEURONTIN) 100 MG capsule Take 100 mg by mouth 2 (two) times daily.     Marland Kitchen ketorolac (ACULAR) 0.5 % ophthalmic solution   0  . lanthanum (FOSRENOL) 1000 MG chewable tablet Chew 1,000-2,000 mg by mouth 3 (three) times daily with meals. Take 2000 mg by mouth 3 times daily with meals and take  by mouth with snacks.    . neomycin-polymyxin b-dexamethasone (MAXITROL) 3.5-10000-0.1 OINT   1  . omeprazole (PRILOSEC) 20 MG capsule Take 1 capsule (20 mg total) by mouth daily. 90 capsule 3  . prednisoLONE acetate (PRED FORTE) 1 % ophthalmic suspension   1  . PROAIR HFA 108 (90 BASE) MCG/ACT inhaler     . SENSIPAR 30 MG tablet Take 30 mg by mouth every evening.     . silver sulfADIAZINE (SILVADENE) 1 % cream Apply 1 application topically daily. 50 g 0  . VIGAMOX 0.5 % ophthalmic solution   1  . RENVELA 800 MG tablet     . traMADol (ULTRAM) 50 MG tablet Take 1 tablet (50 mg total) by mouth every 6 (six) hours as needed. (Patient not taking: Reported on 11/27/2014) 30 tablet 0   No current facility-administered medications for  this visit.    Allergies  Allergen Reactions  . Codeine Hives  . Vancomycin Rash    On ROS today: no ulcers, no further CVA sx   Physical Examination  Filed Vitals:   11/27/14 1624 11/27/14 1627  BP: 166/77 160/74  Pulse: 85 84  Temp: 97.7 F (36.5 C)   TempSrc: Oral   Resp: 20   Height:  (1.549 m)   Weight: 195 lb (88.451 kg)   SpO2: 98%    Body mass index is 36.86 kg/(m^2).  General: A&O x 3, WD, morbidly obese  Pulmonary: Sym exp, good air movt, CTAB, no rales, rhonchi, & wheezing  Cardiac: RRR, Nl S1, S2, no Murmurs, rubs or gallops  Vascular: Vessel Right Left  Radial Faintly Palpable Faintly Palpable  Ulnar Not Palpable Not Palpable  Brachial Palpable Palpable   Gastrointestinal: soft, NTND, no G/R, no HSM, no masses, no CVAT B, pannus overhanging right groin exposure for thigh graft  Musculoskeletal: M/S 5/5 throughout , Extremities without  ischemic changes , + palpable thrill in access in R thigh without pulsatile character, + bruit in access: normal sounding, no ulcers in either foot  Neurologic: Pain and light touch intact in extremities , Motor exam as listed above   Medical Decision Making  [name] is a  81 y.o. female who presents with ESRD requiring hemodialysis, recurrent venous stenosis in R thigh AVG   Again, I reiterate that this patient's morbid obesitys put her at high risk for complication from an R thigh AVG revision as the anastomoses are likely under >3 inches of fat.  I would limit interventions to endovascular at this point, even stenting as needed.   No need for immediate intervention at this point based on exam.   Leonides SakeBrian Adjoa Althouse, MD [facility] Office: [phone] Pager: [phone]  11/27/2014, 5:07 PM

## 2014-11-28 DIAGNOSIS — D631 Anemia in chronic kidney disease: Secondary | ICD-10-CM | POA: Diagnosis not present

## 2014-11-28 DIAGNOSIS — N2581 Secondary hyperparathyroidism of renal origin: Secondary | ICD-10-CM | POA: Diagnosis not present

## 2014-11-28 DIAGNOSIS — N186 End stage renal disease: Secondary | ICD-10-CM | POA: Diagnosis not present

## 2014-11-28 DIAGNOSIS — E1129 Type 2 diabetes mellitus with other diabetic kidney complication: Secondary | ICD-10-CM | POA: Diagnosis not present

## 2014-11-30 DIAGNOSIS — N186 End stage renal disease: Secondary | ICD-10-CM | POA: Diagnosis not present

## 2014-11-30 DIAGNOSIS — N2581 Secondary hyperparathyroidism of renal origin: Secondary | ICD-10-CM | POA: Diagnosis not present

## 2014-11-30 DIAGNOSIS — E1129 Type 2 diabetes mellitus with other diabetic kidney complication: Secondary | ICD-10-CM | POA: Diagnosis not present

## 2014-11-30 DIAGNOSIS — D631 Anemia in chronic kidney disease: Secondary | ICD-10-CM | POA: Diagnosis not present

## 2014-12-03 ENCOUNTER — Ambulatory Visit: Payer: Medicare Other | Admitting: Vascular Surgery

## 2014-12-03 DIAGNOSIS — N2581 Secondary hyperparathyroidism of renal origin: Secondary | ICD-10-CM | POA: Diagnosis not present

## 2014-12-03 DIAGNOSIS — N186 End stage renal disease: Secondary | ICD-10-CM | POA: Diagnosis not present

## 2014-12-03 DIAGNOSIS — D631 Anemia in chronic kidney disease: Secondary | ICD-10-CM | POA: Diagnosis not present

## 2014-12-03 DIAGNOSIS — E1129 Type 2 diabetes mellitus with other diabetic kidney complication: Secondary | ICD-10-CM | POA: Diagnosis not present

## 2014-12-04 DIAGNOSIS — N186 End stage renal disease: Secondary | ICD-10-CM | POA: Diagnosis not present

## 2014-12-04 DIAGNOSIS — E8779 Other fluid overload: Secondary | ICD-10-CM | POA: Diagnosis not present

## 2014-12-05 DIAGNOSIS — N186 End stage renal disease: Secondary | ICD-10-CM | POA: Diagnosis not present

## 2014-12-05 DIAGNOSIS — E1129 Type 2 diabetes mellitus with other diabetic kidney complication: Secondary | ICD-10-CM | POA: Diagnosis not present

## 2014-12-05 DIAGNOSIS — N2581 Secondary hyperparathyroidism of renal origin: Secondary | ICD-10-CM | POA: Diagnosis not present

## 2014-12-05 DIAGNOSIS — D631 Anemia in chronic kidney disease: Secondary | ICD-10-CM | POA: Diagnosis not present

## 2014-12-07 DIAGNOSIS — N186 End stage renal disease: Secondary | ICD-10-CM | POA: Diagnosis not present

## 2014-12-07 DIAGNOSIS — D631 Anemia in chronic kidney disease: Secondary | ICD-10-CM | POA: Diagnosis not present

## 2014-12-07 DIAGNOSIS — N2581 Secondary hyperparathyroidism of renal origin: Secondary | ICD-10-CM | POA: Diagnosis not present

## 2014-12-07 DIAGNOSIS — E1129 Type 2 diabetes mellitus with other diabetic kidney complication: Secondary | ICD-10-CM | POA: Diagnosis not present

## 2014-12-08 DIAGNOSIS — E1129 Type 2 diabetes mellitus with other diabetic kidney complication: Secondary | ICD-10-CM | POA: Diagnosis not present

## 2014-12-08 DIAGNOSIS — N186 End stage renal disease: Secondary | ICD-10-CM | POA: Diagnosis not present

## 2014-12-08 DIAGNOSIS — Z992 Dependence on renal dialysis: Secondary | ICD-10-CM | POA: Diagnosis not present

## 2014-12-10 DIAGNOSIS — T82818A Embolism of vascular prosthetic devices, implants and grafts, initial encounter: Secondary | ICD-10-CM | POA: Diagnosis not present

## 2014-12-10 DIAGNOSIS — D631 Anemia in chronic kidney disease: Secondary | ICD-10-CM | POA: Diagnosis not present

## 2014-12-10 DIAGNOSIS — E8779 Other fluid overload: Secondary | ICD-10-CM | POA: Diagnosis not present

## 2014-12-10 DIAGNOSIS — N2581 Secondary hyperparathyroidism of renal origin: Secondary | ICD-10-CM | POA: Diagnosis not present

## 2014-12-10 DIAGNOSIS — N186 End stage renal disease: Secondary | ICD-10-CM | POA: Diagnosis not present

## 2014-12-12 DIAGNOSIS — N186 End stage renal disease: Secondary | ICD-10-CM | POA: Diagnosis not present

## 2014-12-12 DIAGNOSIS — T82818A Embolism of vascular prosthetic devices, implants and grafts, initial encounter: Secondary | ICD-10-CM | POA: Diagnosis not present

## 2014-12-12 DIAGNOSIS — D631 Anemia in chronic kidney disease: Secondary | ICD-10-CM | POA: Diagnosis not present

## 2014-12-12 DIAGNOSIS — E8779 Other fluid overload: Secondary | ICD-10-CM | POA: Diagnosis not present

## 2014-12-12 DIAGNOSIS — N2581 Secondary hyperparathyroidism of renal origin: Secondary | ICD-10-CM | POA: Diagnosis not present

## 2014-12-13 ENCOUNTER — Ambulatory Visit (INDEPENDENT_AMBULATORY_CARE_PROVIDER_SITE_OTHER): Payer: Medicare Other | Admitting: Podiatry

## 2014-12-13 ENCOUNTER — Encounter: Payer: Self-pay | Admitting: Podiatry

## 2014-12-13 DIAGNOSIS — M79673 Pain in unspecified foot: Secondary | ICD-10-CM

## 2014-12-13 DIAGNOSIS — I739 Peripheral vascular disease, unspecified: Secondary | ICD-10-CM

## 2014-12-13 DIAGNOSIS — B351 Tinea unguium: Secondary | ICD-10-CM | POA: Diagnosis not present

## 2014-12-13 DIAGNOSIS — E1159 Type 2 diabetes mellitus with other circulatory complications: Secondary | ICD-10-CM | POA: Diagnosis not present

## 2014-12-13 DIAGNOSIS — Q828 Other specified congenital malformations of skin: Secondary | ICD-10-CM

## 2014-12-13 DIAGNOSIS — L84 Corns and callosities: Secondary | ICD-10-CM

## 2014-12-13 DIAGNOSIS — E1151 Type 2 diabetes mellitus with diabetic peripheral angiopathy without gangrene: Secondary | ICD-10-CM

## 2014-12-14 DIAGNOSIS — N2581 Secondary hyperparathyroidism of renal origin: Secondary | ICD-10-CM | POA: Diagnosis not present

## 2014-12-14 DIAGNOSIS — N186 End stage renal disease: Secondary | ICD-10-CM | POA: Diagnosis not present

## 2014-12-14 DIAGNOSIS — E8779 Other fluid overload: Secondary | ICD-10-CM | POA: Diagnosis not present

## 2014-12-14 DIAGNOSIS — D631 Anemia in chronic kidney disease: Secondary | ICD-10-CM | POA: Diagnosis not present

## 2014-12-14 DIAGNOSIS — T82818A Embolism of vascular prosthetic devices, implants and grafts, initial encounter: Secondary | ICD-10-CM | POA: Diagnosis not present

## 2014-12-17 DIAGNOSIS — E8779 Other fluid overload: Secondary | ICD-10-CM | POA: Diagnosis not present

## 2014-12-17 DIAGNOSIS — T82818A Embolism of vascular prosthetic devices, implants and grafts, initial encounter: Secondary | ICD-10-CM | POA: Diagnosis not present

## 2014-12-17 DIAGNOSIS — N2581 Secondary hyperparathyroidism of renal origin: Secondary | ICD-10-CM | POA: Diagnosis not present

## 2014-12-17 DIAGNOSIS — N186 End stage renal disease: Secondary | ICD-10-CM | POA: Diagnosis not present

## 2014-12-17 DIAGNOSIS — D631 Anemia in chronic kidney disease: Secondary | ICD-10-CM | POA: Diagnosis not present

## 2014-12-19 ENCOUNTER — Emergency Department (HOSPITAL_COMMUNITY)
Admission: EM | Admit: 2014-12-19 | Discharge: 2014-12-19 | Disposition: A | Discharge status: Home / Self Care | Payer: Medicare Other | Attending: Emergency Medicine | Admitting: Emergency Medicine

## 2014-12-19 ENCOUNTER — Encounter (HOSPITAL_COMMUNITY): Payer: Self-pay | Admitting: Emergency Medicine

## 2014-12-19 ENCOUNTER — Emergency Department (HOSPITAL_COMMUNITY): Payer: Medicare Other

## 2014-12-19 DIAGNOSIS — Z7952 Long term (current) use of systemic steroids: Secondary | ICD-10-CM | POA: Insufficient documentation

## 2014-12-19 DIAGNOSIS — Z992 Dependence on renal dialysis: Secondary | ICD-10-CM | POA: Diagnosis not present

## 2014-12-19 DIAGNOSIS — N186 End stage renal disease: Secondary | ICD-10-CM | POA: Insufficient documentation

## 2014-12-19 DIAGNOSIS — E1140 Type 2 diabetes mellitus with diabetic neuropathy, unspecified: Secondary | ICD-10-CM | POA: Diagnosis not present

## 2014-12-19 DIAGNOSIS — E213 Hyperparathyroidism, unspecified: Secondary | ICD-10-CM | POA: Diagnosis not present

## 2014-12-19 DIAGNOSIS — Z7982 Long term (current) use of aspirin: Secondary | ICD-10-CM | POA: Diagnosis not present

## 2014-12-19 DIAGNOSIS — Z862 Personal history of diseases of the blood and blood-forming organs and certain disorders involving the immune mechanism: Secondary | ICD-10-CM | POA: Insufficient documentation

## 2014-12-19 DIAGNOSIS — Z792 Long term (current) use of antibiotics: Secondary | ICD-10-CM | POA: Diagnosis not present

## 2014-12-19 DIAGNOSIS — E669 Obesity, unspecified: Secondary | ICD-10-CM | POA: Diagnosis not present

## 2014-12-19 DIAGNOSIS — Z8619 Personal history of other infectious and parasitic diseases: Secondary | ICD-10-CM | POA: Insufficient documentation

## 2014-12-19 DIAGNOSIS — I959 Hypotension, unspecified: Secondary | ICD-10-CM | POA: Insufficient documentation

## 2014-12-19 DIAGNOSIS — K219 Gastro-esophageal reflux disease without esophagitis: Secondary | ICD-10-CM | POA: Insufficient documentation

## 2014-12-19 DIAGNOSIS — Z8614 Personal history of Methicillin resistant Staphylococcus aureus infection: Secondary | ICD-10-CM | POA: Insufficient documentation

## 2014-12-19 DIAGNOSIS — T82858D Stenosis of vascular prosthetic devices, implants and grafts, subsequent encounter: Secondary | ICD-10-CM | POA: Diagnosis not present

## 2014-12-19 DIAGNOSIS — M1990 Unspecified osteoarthritis, unspecified site: Secondary | ICD-10-CM | POA: Insufficient documentation

## 2014-12-19 DIAGNOSIS — Z8673 Personal history of transient ischemic attack (TIA), and cerebral infarction without residual deficits: Secondary | ICD-10-CM | POA: Insufficient documentation

## 2014-12-19 DIAGNOSIS — Z8659 Personal history of other mental and behavioral disorders: Secondary | ICD-10-CM | POA: Insufficient documentation

## 2014-12-19 DIAGNOSIS — J984 Other disorders of lung: Secondary | ICD-10-CM | POA: Diagnosis not present

## 2014-12-19 DIAGNOSIS — I871 Compression of vein: Secondary | ICD-10-CM | POA: Diagnosis not present

## 2014-12-19 DIAGNOSIS — Z79899 Other long term (current) drug therapy: Secondary | ICD-10-CM | POA: Diagnosis not present

## 2014-12-19 LAB — BASIC METABOLIC PANEL
Anion gap: 14 (ref 5–15)
BUN: 38 mg/dL — ABNORMAL HIGH (ref 6–20)
CO2: 26 mmol/L (ref 22–32)
Calcium: 9.1 mg/dL (ref 8.9–10.3)
Chloride: 100 mmol/L — ABNORMAL LOW (ref 101–111)
Creatinine, Ser: 9.11 mg/dL — ABNORMAL HIGH (ref 0.44–1.00)
GFR calc Af Amer: 4 mL/min — ABNORMAL LOW (ref >60)
GFR calc non Af Amer: 4 mL/min — ABNORMAL LOW (ref >60)
Glucose, Bld: 103 mg/dL — ABNORMAL HIGH (ref 65–99)
Potassium: 4.8 mmol/L (ref 3.5–5.1)
Sodium: 140 mmol/L (ref 135–145)

## 2014-12-19 LAB — CBC WITH DIFFERENTIAL/PLATELET
Basophils Absolute: 0 10*3/uL (ref 0.0–0.1)
Basophils Relative: 0 % (ref 0–1)
Eosinophils Absolute: 0.1 10*3/uL (ref 0.0–0.7)
Eosinophils Relative: 1 % (ref 0–5)
HCT: 39.1 % (ref 36.0–46.0)
Hemoglobin: 11.9 g/dL — ABNORMAL LOW (ref 12.0–15.0)
Lymphocytes Relative: 10 % — ABNORMAL LOW (ref 12–46)
Lymphs Abs: 0.9 10*3/uL (ref 0.7–4.0)
MCH: 24.2 pg — ABNORMAL LOW (ref 26.0–34.0)
MCHC: 30.4 g/dL (ref 30.0–36.0)
MCV: 79.6 fL (ref 78.0–100.0)
Monocytes Absolute: 0.3 10*3/uL (ref 0.1–1.0)
Monocytes Relative: 3 % (ref 3–12)
Neutro Abs: 7.9 10*3/uL — ABNORMAL HIGH (ref 1.7–7.7)
Neutrophils Relative %: 86 % — ABNORMAL HIGH (ref 43–77)
Platelets: 252 10*3/uL (ref 150–400)
RBC: 4.91 MIL/uL (ref 3.87–5.11)
RDW: 19 % — ABNORMAL HIGH (ref 11.5–15.5)
WBC: 9.2 10*3/uL (ref 4.0–10.5)

## 2014-12-19 LAB — I-STAT CHEM 8, ED
BUN: 40 mg/dL — ABNORMAL HIGH (ref 6–20)
Calcium, Ion: 1.04 mmol/L — ABNORMAL LOW (ref 1.13–1.30)
Chloride: 103 mmol/L (ref 101–111)
Creatinine, Ser: 8.4 mg/dL — ABNORMAL HIGH (ref 0.44–1.00)
Glucose, Bld: 97 mg/dL (ref 65–99)
HCT: 43 % (ref 36.0–46.0)
Hemoglobin: 14.6 g/dL (ref 12.0–15.0)
Potassium: 4.7 mmol/L (ref 3.5–5.1)
Sodium: 140 mmol/L (ref 135–145)
TCO2: 25 mmol/L (ref 0–100)

## 2014-12-19 NOTE — ED Provider Notes (Signed)
Medical screening examination/treatment/procedure(s) were conducted as a shared visit with non-physician practitioner(s) and myself.  I personally evaluated the patient during the encounter.   EKG Interpretation   Date/Time:  Thursday December 19 2014 15:21:57 EDT Ventricular Rate:  81 PR Interval:  170 QRS Duration: 82 QT Interval:  453 QTC Calculation: 526 R Axis:   -61 Text Interpretation:  Sinus arrhythmia Ventricular premature complex  Inferior infarct, old Anterior infarct, old Prolonged QT interval Baseline  wander Otherwise no significant change Confirmed by Guenevere Roorda MD, Reuel Boom  [phone]) on 12/19/2014 3:29:23 PM       See the written copy of this report in the patient's paper medical record.  These results did not interface directly into the electronic medical record and are summarized here.  81 yo F with a chief complaint of transient hypotension. Patient was having a right subclavian Vas-Cath placed for her dialysis access and became hypotensive during the procedure. By the time EMS arrived patient was normotensive no noted neuro deficits. Denying shortness of breath cough chest pain fevers chills.  On exam patient is chronically ill,  right subclavian Vas-Cath in place no purulent discharge and no bleeding. Lungs without rales in the bases. Patient with JVD up to the angle of the jaw bilaterally. Abdomen soft nontender nondistended. Chronic vascular stasis bilateral lower extremities.  Melene Plan, DO 12/19/14 2337

## 2014-12-19 NOTE — ED Notes (Signed)
Pt here from [facility]- pt was there because her graft in her left leg had clotted. Pt was given  versed and of fentanyl for procedure. Pt BP dropped to 76 systolic. Pt was noted to be groggy at that time. Pt BP has since increased to 137/57 with EMS. Pt was placed on 4 L [place] in repsponse to o2sat 88% during procedure. Pt c/o no pain at this time. She sts she is due for dialysis today.

## 2014-12-19 NOTE — ED Provider Notes (Signed)
CSN: [id number]     Arrival date & time 12/19/14  1514 History   First MD Initiated Contact with Patient 12/19/14 1516     Chief Complaint  Patient presents with  . Hypotension   HPI   81 year old female presents today via EMS for hypotension. Patient was at [facility]  for right IJ tunnel catheter placment. After access was successful, patient became hypotensive at 81, she was given normal saline responded well to this. Patient states this is similar to when she gets hypotensive during dialysis. Patient was transferred here via EMS, they report her blood pressure was normal in route. At the time of her arrival here patient's blood pressure was normal, remaining of her vital signs were reassuring.  At the time of arrival here she feels at her normal baseline, no dizziness, fatigue, loss of consciousness, chest pain, shortness of breath, or any other concerning signs or symptoms.     Past Medical History  Diagnosis Date  . Diabetes mellitus   . Anemia   . Hyperparathyroidism   . Depression   . Obesity   . Osteoarthritis   . MRSA (methicillin resistant staph aureus) culture positive   . Gout   . C. difficile diarrhea   . Chronic kidney disease     on Dialysis  . Diabetic neuropathy   . GERD (gastroesophageal reflux disease)   . Stroke   . Peripheral arterial disease     Gangrene-Left Great Toe   Past Surgical History  Procedure Laterality Date  . Breast biopsy Right   . Shuntogram Right 09/06/12    Thigh graft  . Sp declot avgg Right Sep 14, 2012    Right thigh graft   Family History  Problem Relation Age of Onset  . Cancer Father     type unknown  . Diabetes Sister   . Cancer Brother     type unknown  . Stroke Mother    Social History  Substance Use Topics  . Smoking status: Former Smoker    Types: Cigarettes  . Smokeless tobacco: Never Used  . Alcohol Use: No   OB History    No data available     Review of Systems  All other systems  reviewed and are negative.     Allergies  Codeine and Vancomycin  Home Medications   Prior to Admission medications   Medication Sig Start Date End Date Taking? Authorizing Provider  acetaminophen (TYLENOL) 500 MG tablet Take 500-1,000 mg by mouth every 6 (six) hours as needed.    Historical Provider, MD  aspirin 325 MG EC tablet Take 325 mg by mouth daily.    Historical Provider, MD  doxycycline (MONODOX) 100 MG capsule Take 1 capsule by mouth 2 (two) times daily. 05/04/14   Historical Provider, MD  gabapentin (NEURONTIN) 100 MG capsule Take 100 mg by mouth 2 (two) times daily.  10/10/13   Historical Provider, MD  ketorolac (ACULAR) 0.5 % ophthalmic solution  09/06/14   Historical Provider, MD  lanthanum (FOSRENOL) 1000 MG chewable tablet Chew 1,000-2,000 mg by mouth 3 (three) times daily with meals. Take 2000 mg by mouth 3 times daily with meals and take 1000mg  by mouth with snacks.    Historical Provider, MD  neomycin-polymyxin b-dexamethasone (MAXITROL) 3.5-10000-0.1 OINT  08/26/14   Historical Provider, MD  omeprazole (PRILOSEC) 20 MG capsule Take 1 capsule (20 mg total) by mouth daily. 11/28/13   Louis Meckel, MD  prednisoLONE acetate (PRED FORTE) 1 %  ophthalmic suspension  08/26/14   Historical Provider, MD  PROAIR HFA 108 (90 BASE) MCG/ACT inhaler  04/26/14   Historical Provider, MD  RENVELA 800 MG tablet  11/20/14   Historical Provider, MD  SENSIPAR 30 MG tablet Take 30 mg by mouth every evening.  04/13/13   Historical Provider, MD  silver sulfADIAZINE (SILVADENE) 1 % cream Apply 1 application topically daily. 01/02/14   Vivi Barrack, DPM  traMADol (ULTRAM) 50 MG tablet Take 1 tablet (50 mg total) by mouth every 6 (six) hours as needed. 01/01/14   Mancel Bale, MD  VIGAMOX 0.5 % ophthalmic solution  08/26/14   Historical Provider, MD   BP 161/96 mmHg  Pulse 85  Temp(Src) 97.3 F (36.3 C) (Oral)  Resp 19  Ht 5\' 3"  (1.6 m)  Wt 190 lb (86.183 kg)  BMI 33.67 kg/m2  SpO2 95%    Physical Exam  Constitutional: She is oriented to person, place, and time. She appears well-developed and well-nourished.  HENT:  Head: Normocephalic and atraumatic.  Eyes: Conjunctivae are normal. Pupils are equal, round, and reactive to light. Right eye exhibits no discharge. Left eye exhibits no discharge. No scleral icterus.  Neck: Normal range of motion. No JVD present. No tracheal deviation present.  Band-Aid covering small puncture to the right external jugular no active bleeding. No surrounding hematoma  Cardiovascular: Normal rate, regular rhythm, normal heart sounds and intact distal pulses.  Exam reveals no gallop and no friction rub.   No murmur heard. Pulmonary/Chest: Effort normal and breath sounds normal. No stridor. No respiratory distress. She has no wheezes. She has no rales. She exhibits no tenderness.  Central line in place no active bleeding. No surrounding hematoma  Neurological: She is alert and oriented to person, place, and time. Coordination normal.  Skin: Skin is warm and dry.  Psychiatric: She has a normal mood and affect. Her behavior is normal. Judgment and thought content normal.  Nursing note and vitals reviewed.   ED Course  Procedures (including critical care time) Labs Review Labs Reviewed  CBC WITH DIFFERENTIAL/PLATELET - Abnormal; Notable for the following:    Hemoglobin 11.9 (*)    MCH 24.2 (*)    RDW 19.0 (*)    Neutrophils Relative % 86 (*)    Neutro Abs 7.9 (*)    Lymphocytes Relative 10 (*)    All other components within normal limits  BASIC METABOLIC PANEL - Abnormal; Notable for the following:    Chloride 100 (*)    Glucose, Bld 103 (*)    BUN 38 (*)    Creatinine, Ser 9.11 (*)    GFR calc non Af Amer 4 (*)    GFR calc Af Amer 4 (*)    All other components within normal limits  I-STAT CHEM 8, ED - Abnormal; Notable for the following:    BUN 40 (*)    Creatinine, Ser 8.40 (*)    Calcium, Ion 1.04 (*)    All other components  within normal limits    Imaging Review Dg Chest 2 View  12/19/2014   CLINICAL DATA:  Pt here from [facility]- pt was there because her graft in her left leg had clotted. Pt was given 1mg  versed and of fentanyl for procedure. Pt BP dropped to 76 systolic. Pt was noted to be groggy at that time. Pt BP has since increased to 137/57 with EMS. Pt was placed on 4 L [place] in repsponse to o2sat 88% during  procedure. Pt c/o no pain at this time. She sts she is due for dialysis today.  EXAM: CHEST  2 VIEW  COMPARISON:  04/29/2014  FINDINGS: Cardiac silhouette is normal in size and configuration.  No mediastinal or hilar masses or evidence of adenopathy.  Stable areas of interstitial thickening and lung scarring. There are no areas of lung consolidation. No pulmonary edema. No pleural effusion or pneumothorax.  Right anterior chest wall dual lumen central venous catheter has its tip in the mid superior vena cava.  Bony thorax is demineralized but grossly intact.  IMPRESSION: No acute cardiopulmonary disease.   Electronically Signed   By: Amie Portland M.D.   On: 12/19/2014 17:03     EKG Interpretation   Date/Time:  Thursday December 19 2014 15:21:57 EDT Ventricular Rate:  81 PR Interval:  170 QRS Duration: 82 QT Interval:  453 QTC Calculation: 526 R Axis:   -61 Text Interpretation:  Sinus arrhythmia Ventricular premature complex  Inferior infarct, old Anterior infarct, old Prolonged QT interval Baseline  wander Otherwise no significant change Confirmed by FLOYD MD, DANIEL  [phone]) on 12/19/2014 3:29:23 PM      MDM   Final diagnoses:  Hypotension, unspecified hypotension type    Labs: I-STAT Chem-8, CBC, BMP-  no elevations and potassium, creatinine 8.40  Imaging: DG chest 2 view-   Consults: Nephrology  Therapeutics:  Discharge Meds:   Assessment/Plan: Patient presents with an episode of hypotension, this was resolved by the time of ED arrival. This is likely due  to fentanyl and Versed given for procedure. Vital signs have remained stable and reassuring here in the ED, labs reassuring, patient has no complaints. Patient has normal potassium here. Nephrology instructed to have her contact her dialysis center tomorrow for follow-up evaluation. Patient will be discharged home with strict return precautions instructions to follow-up with her dialysis center. Patient verbalized understanding and agreement for today's plan and had no further questions or concerns at the time of discharge.     Eyvonne Mechanic, PA-C 12/20/14 0031  Melene Plan, DO 12/20/14 1712

## 2014-12-19 NOTE — Discharge Instructions (Signed)
Hypotension As your heart beats, it forces blood through your body. This force is called blood pressure. If you have hypotension, you have low blood pressure. When your blood pressure is too low, you may not get enough blood to your brain. You may feel weak, feel lightheaded, have a fast heartbeat, or even pass out (faint). HOME CARE  Drink enough fluids to keep your pee (urine) clear or pale yellow.  Take all medicines as told by your doctor.  Get up slowly after sitting or lying down.  Wear support stockings as told by your doctor.  Maintain a healthy diet by including foods such as fruits, vegetables, nuts, whole grains, and lean meats. GET HELP IF:  You are throwing up (vomiting) or have watery poop (diarrhea).  You have a fever for more than 2-3 days.  You feel more thirsty than usual.  You feel weak and tired. GET HELP RIGHT AWAY IF:   You pass out (faint).  You have chest pain or a fast or irregular heartbeat.  You lose feeling in part of your body.  You cannot move your arms or legs.  You have trouble speaking.  You get sweaty or feel lightheaded. MAKE SURE YOU:   Understand these instructions.  Will watch your condition.  Will get help right away if you are not doing well or get worse. Document Released: 07/21/2009 Document Revised: 12/27/2012 Document Reviewed: 10/27/2012 [facility] Patient Information 2015 [place], [place]. This information is not intended to replace advice given to you by your health care provider. Make sure you discuss any questions you have with your health care provider.  Please monitor for new or worsening signs or symptoms return immediately if any present. Please contact your dialysis Center and schedule follow-up evaluation.

## 2014-12-19 NOTE — Progress Notes (Signed)
Patient ID: [name], female   DOB: Jul 29, 1932, 81 y.o.   MRN: [id number]  Subjective: 81 y.o. returns the office today for painful, elongated, thickened toenails and calluses which makes it difficult to walk. Denies any redness or drainage around the nails/calluses. She has since discontinued going to the wound care center as the left hallux is healed. Denies any acute changes since last appointment and no new complaints today. Denies any systemic complaints such as fevers, chills, nausea, vomiting.   Objective: AAO 3, NAD DP/PT pulses decreased Protective sensation decreased with Simms Weinstein monofilament, Achilles tendon reflex intact.  Nails hypertrophic, dystrophic, elongated, brittle, discolored 9. There is tenderness overlying the nails 2-5 on the left and 1-5 on the right. There is no surrounding erythema or drainage along the nail sites. Hyperkeratotic lesions bilateral foot submetatarsal one and 5. Upon debridement there was no underlying ulceration, drainage or other clinical signs of infection. No open lesions or pre-ulcerative lesions are identified. No other areas of tenderness bilateral lower extremities. No overlying edema, erythema, increased warmth. No pain with calf compression, swelling, warmth, erythema.  Assessment: Patient presents with symptomatic onychomycosis  Plan: -Treatment options including alternatives, risks, complications were discussed -Nails sharply debrided 10 without complication/bleeding. -Hyperkeratotic lesion sharply debrided 4 without, complications/bleeding -Discussed daily foot inspection. If there are any changes, to call the office immediately.  -Follow-up in 3 months or sooner if any problems are to arise. In the meantime, encouraged to call the office with any questions, concerns, changes symptoms.   Ovid Curd, DPM

## 2014-12-20 DIAGNOSIS — N186 End stage renal disease: Secondary | ICD-10-CM | POA: Diagnosis not present

## 2014-12-20 DIAGNOSIS — T82818A Embolism of vascular prosthetic devices, implants and grafts, initial encounter: Secondary | ICD-10-CM | POA: Diagnosis not present

## 2014-12-20 DIAGNOSIS — N2581 Secondary hyperparathyroidism of renal origin: Secondary | ICD-10-CM | POA: Diagnosis not present

## 2014-12-20 DIAGNOSIS — D631 Anemia in chronic kidney disease: Secondary | ICD-10-CM | POA: Diagnosis not present

## 2014-12-20 DIAGNOSIS — E8779 Other fluid overload: Secondary | ICD-10-CM | POA: Diagnosis not present

## 2014-12-21 DIAGNOSIS — E8779 Other fluid overload: Secondary | ICD-10-CM | POA: Diagnosis not present

## 2014-12-21 DIAGNOSIS — N186 End stage renal disease: Secondary | ICD-10-CM | POA: Diagnosis not present

## 2014-12-21 DIAGNOSIS — D631 Anemia in chronic kidney disease: Secondary | ICD-10-CM | POA: Diagnosis not present

## 2014-12-21 DIAGNOSIS — T82818A Embolism of vascular prosthetic devices, implants and grafts, initial encounter: Secondary | ICD-10-CM | POA: Diagnosis not present

## 2014-12-21 DIAGNOSIS — N2581 Secondary hyperparathyroidism of renal origin: Secondary | ICD-10-CM | POA: Diagnosis not present

## 2014-12-24 DIAGNOSIS — T82858D Stenosis of vascular prosthetic devices, implants and grafts, subsequent encounter: Secondary | ICD-10-CM | POA: Diagnosis not present

## 2014-12-24 DIAGNOSIS — N186 End stage renal disease: Secondary | ICD-10-CM | POA: Diagnosis not present

## 2014-12-24 DIAGNOSIS — I871 Compression of vein: Secondary | ICD-10-CM | POA: Diagnosis not present

## 2014-12-24 DIAGNOSIS — Z992 Dependence on renal dialysis: Secondary | ICD-10-CM | POA: Diagnosis not present

## 2014-12-24 DIAGNOSIS — D631 Anemia in chronic kidney disease: Secondary | ICD-10-CM | POA: Diagnosis not present

## 2014-12-24 DIAGNOSIS — E8779 Other fluid overload: Secondary | ICD-10-CM | POA: Diagnosis not present

## 2014-12-24 DIAGNOSIS — N2581 Secondary hyperparathyroidism of renal origin: Secondary | ICD-10-CM | POA: Diagnosis not present

## 2014-12-24 DIAGNOSIS — T82818A Embolism of vascular prosthetic devices, implants and grafts, initial encounter: Secondary | ICD-10-CM | POA: Diagnosis not present

## 2014-12-24 IMAGING — CR DG CHEST 2V
2 series · 2 of 2 positions shown · non-contrast
Comparison: 12/14/2013 and earlier.

CLINICAL DATA: 81-year-old female with cough for 2 weeks without
fever. Shortness of Breath. Former smoker. Initial encounter.

EXAM:
CHEST  2 VIEW

[w chest pa]
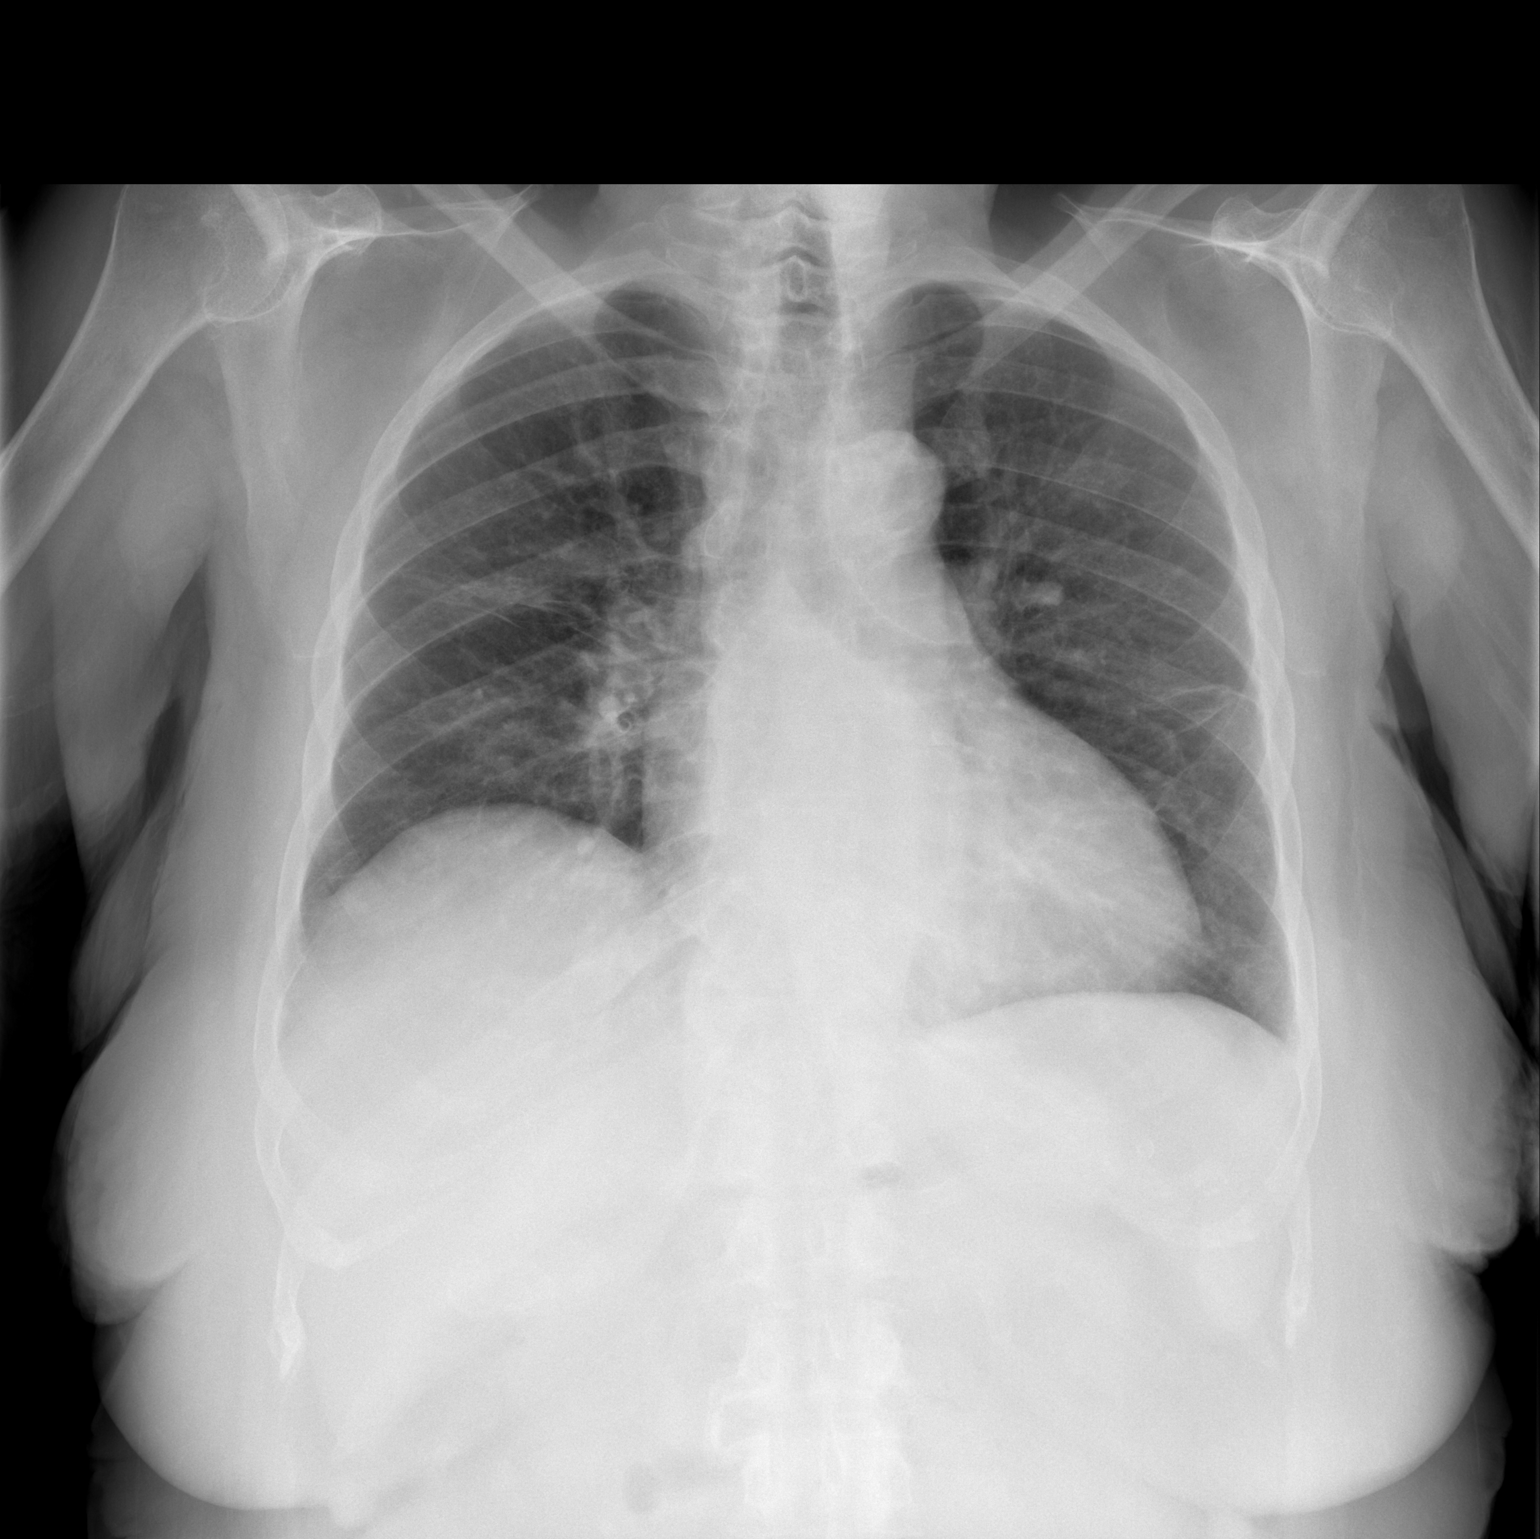

[w chest lat]
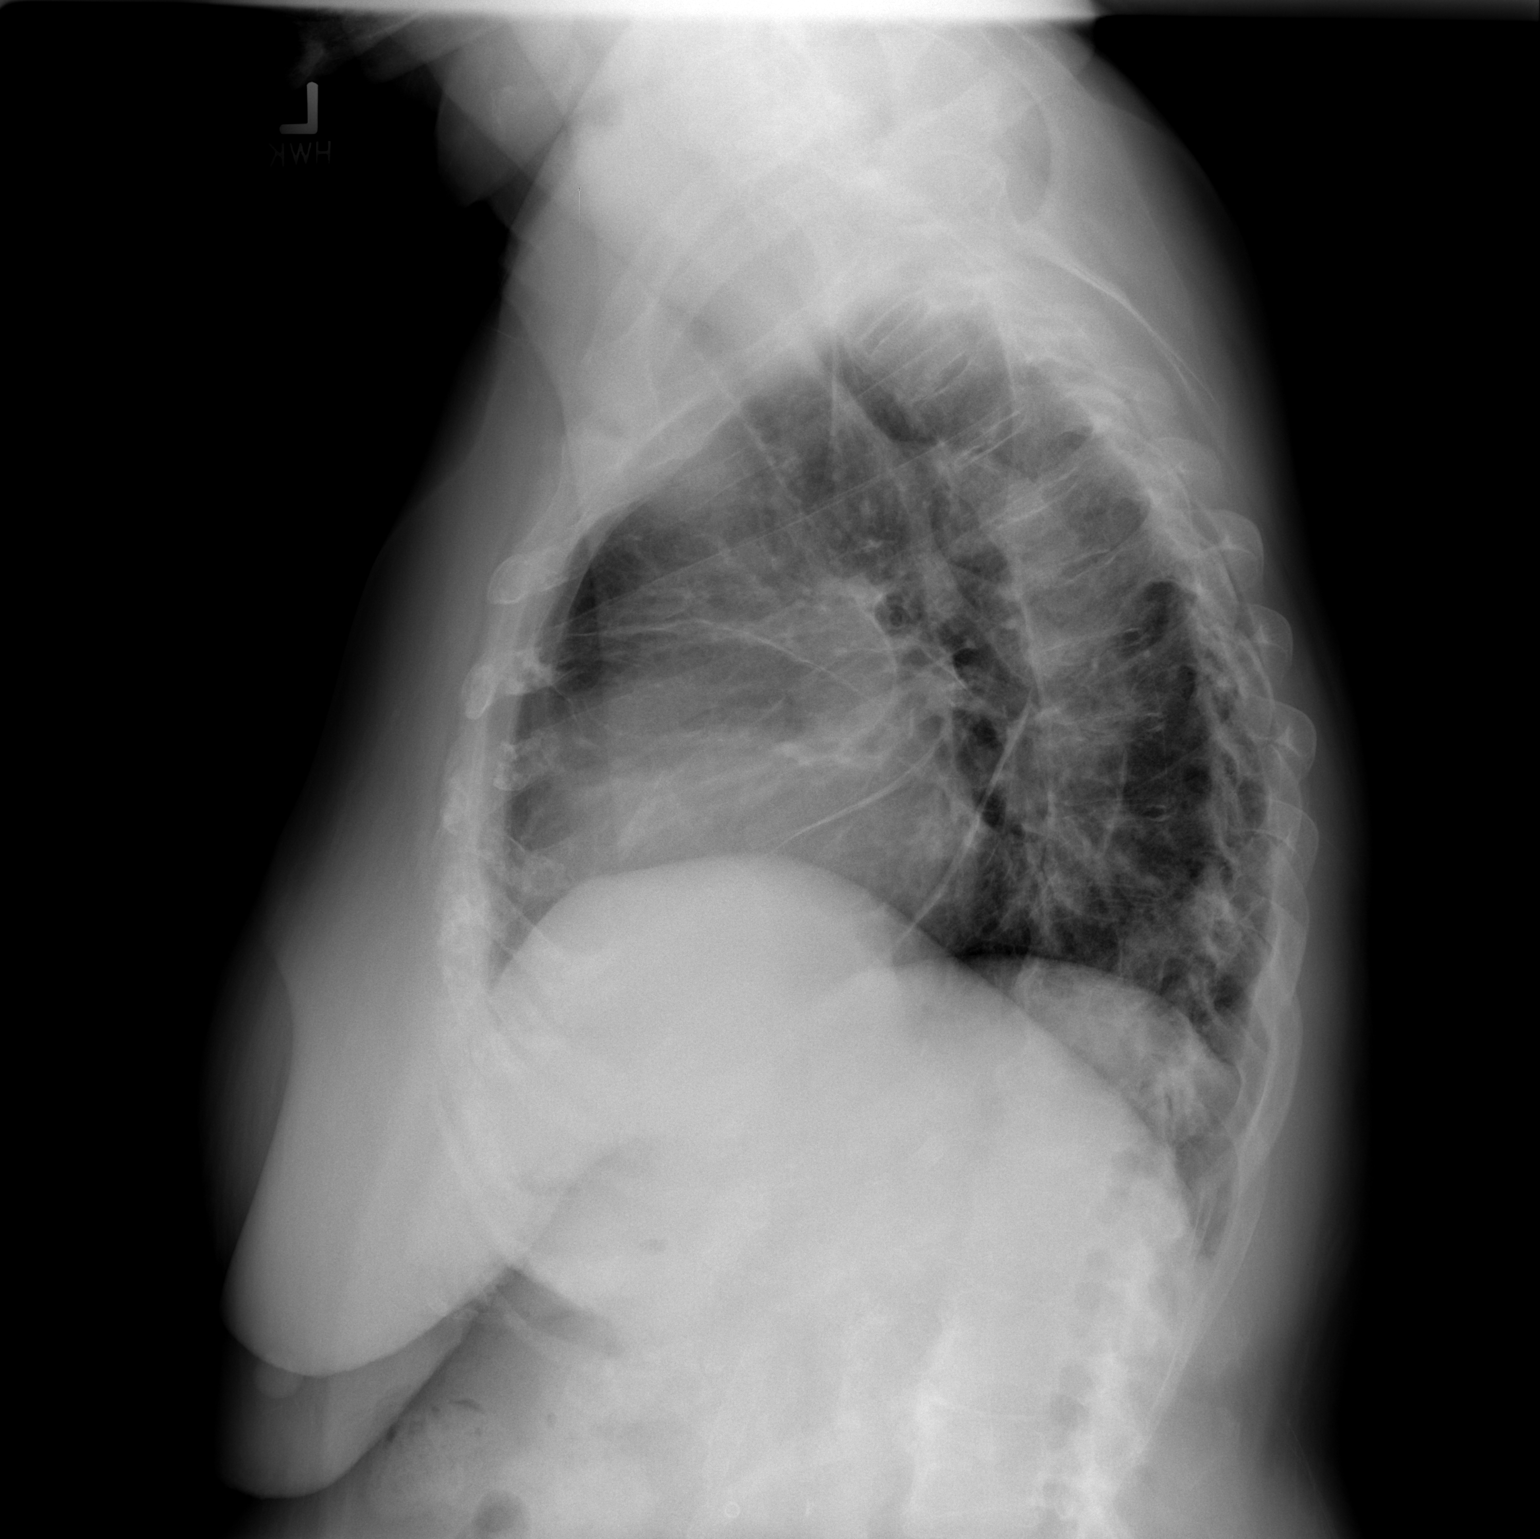

[2 of 2 positions shown; findings below may reference images not displayed]

FINDINGS: Chronic perihilar linear markings most resemble scarring and have
been present since 8373. Stable cardiomegaly and mediastinal
contours. Stable lung volumes, chronic mild elevation of the right
hemidiaphragm. No pneumothorax or pulmonary edema. No pleural
effusion or consolidation. Calcified atherosclerosis of the thoracic
aorta which is mildly tortuous. No acute or confluent pulmonary
opacity identified. No acute osseous abnormality identified.
IMPRESSION: No acute cardiopulmonary abnormality.

## 2014-12-25 ENCOUNTER — Ambulatory Visit: Payer: Medicare Other | Admitting: Podiatry

## 2014-12-26 DIAGNOSIS — E8779 Other fluid overload: Secondary | ICD-10-CM | POA: Diagnosis not present

## 2014-12-26 DIAGNOSIS — T82818A Embolism of vascular prosthetic devices, implants and grafts, initial encounter: Secondary | ICD-10-CM | POA: Diagnosis not present

## 2014-12-26 DIAGNOSIS — D631 Anemia in chronic kidney disease: Secondary | ICD-10-CM | POA: Diagnosis not present

## 2014-12-26 DIAGNOSIS — N2581 Secondary hyperparathyroidism of renal origin: Secondary | ICD-10-CM | POA: Diagnosis not present

## 2014-12-26 DIAGNOSIS — N186 End stage renal disease: Secondary | ICD-10-CM | POA: Diagnosis not present

## 2014-12-27 DIAGNOSIS — T82818A Embolism of vascular prosthetic devices, implants and grafts, initial encounter: Secondary | ICD-10-CM | POA: Diagnosis not present

## 2014-12-27 DIAGNOSIS — D631 Anemia in chronic kidney disease: Secondary | ICD-10-CM | POA: Diagnosis not present

## 2014-12-27 DIAGNOSIS — N186 End stage renal disease: Secondary | ICD-10-CM | POA: Diagnosis not present

## 2014-12-27 DIAGNOSIS — E8779 Other fluid overload: Secondary | ICD-10-CM | POA: Diagnosis not present

## 2014-12-27 DIAGNOSIS — N2581 Secondary hyperparathyroidism of renal origin: Secondary | ICD-10-CM | POA: Diagnosis not present

## 2014-12-28 DIAGNOSIS — N2581 Secondary hyperparathyroidism of renal origin: Secondary | ICD-10-CM | POA: Diagnosis not present

## 2014-12-28 DIAGNOSIS — D631 Anemia in chronic kidney disease: Secondary | ICD-10-CM | POA: Diagnosis not present

## 2014-12-28 DIAGNOSIS — E8779 Other fluid overload: Secondary | ICD-10-CM | POA: Diagnosis not present

## 2014-12-28 DIAGNOSIS — T82818A Embolism of vascular prosthetic devices, implants and grafts, initial encounter: Secondary | ICD-10-CM | POA: Diagnosis not present

## 2014-12-28 DIAGNOSIS — N186 End stage renal disease: Secondary | ICD-10-CM | POA: Diagnosis not present

## 2014-12-31 DIAGNOSIS — N2581 Secondary hyperparathyroidism of renal origin: Secondary | ICD-10-CM | POA: Diagnosis not present

## 2014-12-31 DIAGNOSIS — N186 End stage renal disease: Secondary | ICD-10-CM | POA: Diagnosis not present

## 2014-12-31 DIAGNOSIS — T82818A Embolism of vascular prosthetic devices, implants and grafts, initial encounter: Secondary | ICD-10-CM | POA: Diagnosis not present

## 2014-12-31 DIAGNOSIS — D631 Anemia in chronic kidney disease: Secondary | ICD-10-CM | POA: Diagnosis not present

## 2014-12-31 DIAGNOSIS — E8779 Other fluid overload: Secondary | ICD-10-CM | POA: Diagnosis not present

## 2015-01-02 DIAGNOSIS — D631 Anemia in chronic kidney disease: Secondary | ICD-10-CM | POA: Diagnosis not present

## 2015-01-02 DIAGNOSIS — N186 End stage renal disease: Secondary | ICD-10-CM | POA: Diagnosis not present

## 2015-01-02 DIAGNOSIS — T82818A Embolism of vascular prosthetic devices, implants and grafts, initial encounter: Secondary | ICD-10-CM | POA: Diagnosis not present

## 2015-01-02 DIAGNOSIS — N2581 Secondary hyperparathyroidism of renal origin: Secondary | ICD-10-CM | POA: Diagnosis not present

## 2015-01-02 DIAGNOSIS — E8779 Other fluid overload: Secondary | ICD-10-CM | POA: Diagnosis not present

## 2015-01-03 ENCOUNTER — Ambulatory Visit: Payer: Medicare Other | Admitting: Podiatry

## 2015-01-04 DIAGNOSIS — D631 Anemia in chronic kidney disease: Secondary | ICD-10-CM | POA: Diagnosis not present

## 2015-01-04 DIAGNOSIS — N2581 Secondary hyperparathyroidism of renal origin: Secondary | ICD-10-CM | POA: Diagnosis not present

## 2015-01-04 DIAGNOSIS — E8779 Other fluid overload: Secondary | ICD-10-CM | POA: Diagnosis not present

## 2015-01-04 DIAGNOSIS — T82818A Embolism of vascular prosthetic devices, implants and grafts, initial encounter: Secondary | ICD-10-CM | POA: Diagnosis not present

## 2015-01-04 DIAGNOSIS — N186 End stage renal disease: Secondary | ICD-10-CM | POA: Diagnosis not present

## 2015-01-06 DIAGNOSIS — H6123 Impacted cerumen, bilateral: Secondary | ICD-10-CM | POA: Diagnosis not present

## 2015-01-06 DIAGNOSIS — K644 Residual hemorrhoidal skin tags: Secondary | ICD-10-CM | POA: Diagnosis not present

## 2015-01-06 DIAGNOSIS — I6350 Cerebral infarction due to unspecified occlusion or stenosis of unspecified cerebral artery: Secondary | ICD-10-CM | POA: Diagnosis not present

## 2015-01-06 DIAGNOSIS — E1140 Type 2 diabetes mellitus with diabetic neuropathy, unspecified: Secondary | ICD-10-CM | POA: Diagnosis not present

## 2015-01-06 DIAGNOSIS — I959 Hypotension, unspecified: Secondary | ICD-10-CM | POA: Diagnosis not present

## 2015-01-06 DIAGNOSIS — Z23 Encounter for immunization: Secondary | ICD-10-CM | POA: Diagnosis not present

## 2015-01-06 DIAGNOSIS — Z992 Dependence on renal dialysis: Secondary | ICD-10-CM | POA: Diagnosis not present

## 2015-01-06 DIAGNOSIS — H9190 Unspecified hearing loss, unspecified ear: Secondary | ICD-10-CM | POA: Diagnosis not present

## 2015-01-06 DIAGNOSIS — E640 Sequelae of protein-calorie malnutrition: Secondary | ICD-10-CM | POA: Diagnosis not present

## 2015-01-06 DIAGNOSIS — N186 End stage renal disease: Secondary | ICD-10-CM | POA: Diagnosis not present

## 2015-01-06 DIAGNOSIS — N2581 Secondary hyperparathyroidism of renal origin: Secondary | ICD-10-CM | POA: Diagnosis not present

## 2015-01-06 DIAGNOSIS — E11329 Type 2 diabetes mellitus with mild nonproliferative diabetic retinopathy without macular edema: Secondary | ICD-10-CM | POA: Diagnosis not present

## 2015-01-07 DIAGNOSIS — T82818A Embolism of vascular prosthetic devices, implants and grafts, initial encounter: Secondary | ICD-10-CM | POA: Diagnosis not present

## 2015-01-07 DIAGNOSIS — D631 Anemia in chronic kidney disease: Secondary | ICD-10-CM | POA: Diagnosis not present

## 2015-01-07 DIAGNOSIS — N2581 Secondary hyperparathyroidism of renal origin: Secondary | ICD-10-CM | POA: Diagnosis not present

## 2015-01-07 DIAGNOSIS — E8779 Other fluid overload: Secondary | ICD-10-CM | POA: Diagnosis not present

## 2015-01-07 DIAGNOSIS — N186 End stage renal disease: Secondary | ICD-10-CM | POA: Diagnosis not present

## 2015-01-08 DIAGNOSIS — N186 End stage renal disease: Secondary | ICD-10-CM | POA: Diagnosis not present

## 2015-01-08 DIAGNOSIS — Z992 Dependence on renal dialysis: Secondary | ICD-10-CM | POA: Diagnosis not present

## 2015-01-08 DIAGNOSIS — E1129 Type 2 diabetes mellitus with other diabetic kidney complication: Secondary | ICD-10-CM | POA: Diagnosis not present

## 2015-01-09 DIAGNOSIS — N186 End stage renal disease: Secondary | ICD-10-CM | POA: Diagnosis not present

## 2015-01-09 DIAGNOSIS — N2581 Secondary hyperparathyroidism of renal origin: Secondary | ICD-10-CM | POA: Diagnosis not present

## 2015-01-09 DIAGNOSIS — E1129 Type 2 diabetes mellitus with other diabetic kidney complication: Secondary | ICD-10-CM | POA: Diagnosis not present

## 2015-01-09 DIAGNOSIS — D631 Anemia in chronic kidney disease: Secondary | ICD-10-CM | POA: Diagnosis not present

## 2015-01-09 DIAGNOSIS — Z23 Encounter for immunization: Secondary | ICD-10-CM | POA: Diagnosis not present

## 2015-01-11 DIAGNOSIS — Z23 Encounter for immunization: Secondary | ICD-10-CM | POA: Diagnosis not present

## 2015-01-11 DIAGNOSIS — E1129 Type 2 diabetes mellitus with other diabetic kidney complication: Secondary | ICD-10-CM | POA: Diagnosis not present

## 2015-01-11 DIAGNOSIS — D631 Anemia in chronic kidney disease: Secondary | ICD-10-CM | POA: Diagnosis not present

## 2015-01-11 DIAGNOSIS — N2581 Secondary hyperparathyroidism of renal origin: Secondary | ICD-10-CM | POA: Diagnosis not present

## 2015-01-11 DIAGNOSIS — N186 End stage renal disease: Secondary | ICD-10-CM | POA: Diagnosis not present

## 2015-01-14 DIAGNOSIS — N186 End stage renal disease: Secondary | ICD-10-CM | POA: Diagnosis not present

## 2015-01-14 DIAGNOSIS — E1129 Type 2 diabetes mellitus with other diabetic kidney complication: Secondary | ICD-10-CM | POA: Diagnosis not present

## 2015-01-14 DIAGNOSIS — N2581 Secondary hyperparathyroidism of renal origin: Secondary | ICD-10-CM | POA: Diagnosis not present

## 2015-01-14 DIAGNOSIS — D631 Anemia in chronic kidney disease: Secondary | ICD-10-CM | POA: Diagnosis not present

## 2015-01-14 DIAGNOSIS — Z23 Encounter for immunization: Secondary | ICD-10-CM | POA: Diagnosis not present

## 2015-01-16 DIAGNOSIS — Z23 Encounter for immunization: Secondary | ICD-10-CM | POA: Diagnosis not present

## 2015-01-16 DIAGNOSIS — D631 Anemia in chronic kidney disease: Secondary | ICD-10-CM | POA: Diagnosis not present

## 2015-01-16 DIAGNOSIS — N186 End stage renal disease: Secondary | ICD-10-CM | POA: Diagnosis not present

## 2015-01-16 DIAGNOSIS — E1129 Type 2 diabetes mellitus with other diabetic kidney complication: Secondary | ICD-10-CM | POA: Diagnosis not present

## 2015-01-16 DIAGNOSIS — N2581 Secondary hyperparathyroidism of renal origin: Secondary | ICD-10-CM | POA: Diagnosis not present

## 2015-01-18 DIAGNOSIS — N2581 Secondary hyperparathyroidism of renal origin: Secondary | ICD-10-CM | POA: Diagnosis not present

## 2015-01-18 DIAGNOSIS — Z23 Encounter for immunization: Secondary | ICD-10-CM | POA: Diagnosis not present

## 2015-01-18 DIAGNOSIS — N186 End stage renal disease: Secondary | ICD-10-CM | POA: Diagnosis not present

## 2015-01-18 DIAGNOSIS — D631 Anemia in chronic kidney disease: Secondary | ICD-10-CM | POA: Diagnosis not present

## 2015-01-18 DIAGNOSIS — E1129 Type 2 diabetes mellitus with other diabetic kidney complication: Secondary | ICD-10-CM | POA: Diagnosis not present

## 2015-01-21 DIAGNOSIS — N186 End stage renal disease: Secondary | ICD-10-CM | POA: Diagnosis not present

## 2015-01-21 DIAGNOSIS — N2581 Secondary hyperparathyroidism of renal origin: Secondary | ICD-10-CM | POA: Diagnosis not present

## 2015-01-21 DIAGNOSIS — E1129 Type 2 diabetes mellitus with other diabetic kidney complication: Secondary | ICD-10-CM | POA: Diagnosis not present

## 2015-01-21 DIAGNOSIS — D631 Anemia in chronic kidney disease: Secondary | ICD-10-CM | POA: Diagnosis not present

## 2015-01-21 DIAGNOSIS — Z23 Encounter for immunization: Secondary | ICD-10-CM | POA: Diagnosis not present

## 2015-01-23 DIAGNOSIS — N186 End stage renal disease: Secondary | ICD-10-CM | POA: Diagnosis not present

## 2015-01-23 DIAGNOSIS — E1129 Type 2 diabetes mellitus with other diabetic kidney complication: Secondary | ICD-10-CM | POA: Diagnosis not present

## 2015-01-23 DIAGNOSIS — N2581 Secondary hyperparathyroidism of renal origin: Secondary | ICD-10-CM | POA: Diagnosis not present

## 2015-01-23 DIAGNOSIS — Z23 Encounter for immunization: Secondary | ICD-10-CM | POA: Diagnosis not present

## 2015-01-23 DIAGNOSIS — D631 Anemia in chronic kidney disease: Secondary | ICD-10-CM | POA: Diagnosis not present

## 2015-01-25 DIAGNOSIS — D631 Anemia in chronic kidney disease: Secondary | ICD-10-CM | POA: Diagnosis not present

## 2015-01-25 DIAGNOSIS — E1129 Type 2 diabetes mellitus with other diabetic kidney complication: Secondary | ICD-10-CM | POA: Diagnosis not present

## 2015-01-25 DIAGNOSIS — N186 End stage renal disease: Secondary | ICD-10-CM | POA: Diagnosis not present

## 2015-01-25 DIAGNOSIS — Z23 Encounter for immunization: Secondary | ICD-10-CM | POA: Diagnosis not present

## 2015-01-25 DIAGNOSIS — N2581 Secondary hyperparathyroidism of renal origin: Secondary | ICD-10-CM | POA: Diagnosis not present

## 2015-01-28 DIAGNOSIS — D631 Anemia in chronic kidney disease: Secondary | ICD-10-CM | POA: Diagnosis not present

## 2015-01-28 DIAGNOSIS — N2581 Secondary hyperparathyroidism of renal origin: Secondary | ICD-10-CM | POA: Diagnosis not present

## 2015-01-28 DIAGNOSIS — Z23 Encounter for immunization: Secondary | ICD-10-CM | POA: Diagnosis not present

## 2015-01-28 DIAGNOSIS — E1129 Type 2 diabetes mellitus with other diabetic kidney complication: Secondary | ICD-10-CM | POA: Diagnosis not present

## 2015-01-28 DIAGNOSIS — N186 End stage renal disease: Secondary | ICD-10-CM | POA: Diagnosis not present

## 2015-01-30 ENCOUNTER — Other Ambulatory Visit: Payer: Self-pay

## 2015-01-30 DIAGNOSIS — N186 End stage renal disease: Secondary | ICD-10-CM | POA: Diagnosis not present

## 2015-01-30 DIAGNOSIS — D631 Anemia in chronic kidney disease: Secondary | ICD-10-CM | POA: Diagnosis not present

## 2015-01-30 DIAGNOSIS — Z23 Encounter for immunization: Secondary | ICD-10-CM | POA: Diagnosis not present

## 2015-01-30 DIAGNOSIS — N2581 Secondary hyperparathyroidism of renal origin: Secondary | ICD-10-CM | POA: Diagnosis not present

## 2015-01-30 DIAGNOSIS — E1129 Type 2 diabetes mellitus with other diabetic kidney complication: Secondary | ICD-10-CM | POA: Diagnosis not present

## 2015-01-30 DIAGNOSIS — Z1231 Encounter for screening mammogram for malignant neoplasm of breast: Secondary | ICD-10-CM

## 2015-02-01 DIAGNOSIS — E1129 Type 2 diabetes mellitus with other diabetic kidney complication: Secondary | ICD-10-CM | POA: Diagnosis not present

## 2015-02-01 DIAGNOSIS — N186 End stage renal disease: Secondary | ICD-10-CM | POA: Diagnosis not present

## 2015-02-01 DIAGNOSIS — D631 Anemia in chronic kidney disease: Secondary | ICD-10-CM | POA: Diagnosis not present

## 2015-02-01 DIAGNOSIS — N2581 Secondary hyperparathyroidism of renal origin: Secondary | ICD-10-CM | POA: Diagnosis not present

## 2015-02-01 DIAGNOSIS — Z23 Encounter for immunization: Secondary | ICD-10-CM | POA: Diagnosis not present

## 2015-02-04 DIAGNOSIS — E1129 Type 2 diabetes mellitus with other diabetic kidney complication: Secondary | ICD-10-CM | POA: Diagnosis not present

## 2015-02-04 DIAGNOSIS — N2581 Secondary hyperparathyroidism of renal origin: Secondary | ICD-10-CM | POA: Diagnosis not present

## 2015-02-04 DIAGNOSIS — D631 Anemia in chronic kidney disease: Secondary | ICD-10-CM | POA: Diagnosis not present

## 2015-02-04 DIAGNOSIS — N186 End stage renal disease: Secondary | ICD-10-CM | POA: Diagnosis not present

## 2015-02-04 DIAGNOSIS — Z23 Encounter for immunization: Secondary | ICD-10-CM | POA: Diagnosis not present

## 2015-02-06 DIAGNOSIS — N2581 Secondary hyperparathyroidism of renal origin: Secondary | ICD-10-CM | POA: Diagnosis not present

## 2015-02-06 DIAGNOSIS — D631 Anemia in chronic kidney disease: Secondary | ICD-10-CM | POA: Diagnosis not present

## 2015-02-06 DIAGNOSIS — E1129 Type 2 diabetes mellitus with other diabetic kidney complication: Secondary | ICD-10-CM | POA: Diagnosis not present

## 2015-02-06 DIAGNOSIS — N186 End stage renal disease: Secondary | ICD-10-CM | POA: Diagnosis not present

## 2015-02-06 DIAGNOSIS — Z23 Encounter for immunization: Secondary | ICD-10-CM | POA: Diagnosis not present

## 2015-02-07 DIAGNOSIS — Z992 Dependence on renal dialysis: Secondary | ICD-10-CM | POA: Diagnosis not present

## 2015-02-07 DIAGNOSIS — N186 End stage renal disease: Secondary | ICD-10-CM | POA: Diagnosis not present

## 2015-02-07 DIAGNOSIS — E1129 Type 2 diabetes mellitus with other diabetic kidney complication: Secondary | ICD-10-CM | POA: Diagnosis not present

## 2015-02-08 DIAGNOSIS — E1129 Type 2 diabetes mellitus with other diabetic kidney complication: Secondary | ICD-10-CM | POA: Diagnosis not present

## 2015-02-08 DIAGNOSIS — D631 Anemia in chronic kidney disease: Secondary | ICD-10-CM | POA: Diagnosis not present

## 2015-02-08 DIAGNOSIS — N186 End stage renal disease: Secondary | ICD-10-CM | POA: Diagnosis not present

## 2015-02-08 DIAGNOSIS — N2581 Secondary hyperparathyroidism of renal origin: Secondary | ICD-10-CM | POA: Diagnosis not present

## 2015-02-10 DIAGNOSIS — H04123 Dry eye syndrome of bilateral lacrimal glands: Secondary | ICD-10-CM | POA: Diagnosis not present

## 2015-02-11 DIAGNOSIS — E1129 Type 2 diabetes mellitus with other diabetic kidney complication: Secondary | ICD-10-CM | POA: Diagnosis not present

## 2015-02-11 DIAGNOSIS — N186 End stage renal disease: Secondary | ICD-10-CM | POA: Diagnosis not present

## 2015-02-11 DIAGNOSIS — N2581 Secondary hyperparathyroidism of renal origin: Secondary | ICD-10-CM | POA: Diagnosis not present

## 2015-02-11 DIAGNOSIS — D631 Anemia in chronic kidney disease: Secondary | ICD-10-CM | POA: Diagnosis not present

## 2015-02-13 ENCOUNTER — Ambulatory Visit
Admission: RE | Admit: 2015-02-13 | Discharge: 2015-02-13 | Disposition: A | Discharge status: Home / Self Care | Payer: Medicare Other | Source: Ambulatory Visit

## 2015-02-13 DIAGNOSIS — N186 End stage renal disease: Secondary | ICD-10-CM | POA: Diagnosis not present

## 2015-02-13 DIAGNOSIS — N2581 Secondary hyperparathyroidism of renal origin: Secondary | ICD-10-CM | POA: Diagnosis not present

## 2015-02-13 DIAGNOSIS — D631 Anemia in chronic kidney disease: Secondary | ICD-10-CM | POA: Diagnosis not present

## 2015-02-13 DIAGNOSIS — Z1231 Encounter for screening mammogram for malignant neoplasm of breast: Secondary | ICD-10-CM

## 2015-02-13 DIAGNOSIS — E1129 Type 2 diabetes mellitus with other diabetic kidney complication: Secondary | ICD-10-CM | POA: Diagnosis not present

## 2015-02-14 ENCOUNTER — Ambulatory Visit: Payer: Medicare Other | Admitting: Podiatry

## 2015-02-15 DIAGNOSIS — N186 End stage renal disease: Secondary | ICD-10-CM | POA: Diagnosis not present

## 2015-02-15 DIAGNOSIS — N2581 Secondary hyperparathyroidism of renal origin: Secondary | ICD-10-CM | POA: Diagnosis not present

## 2015-02-15 DIAGNOSIS — E1129 Type 2 diabetes mellitus with other diabetic kidney complication: Secondary | ICD-10-CM | POA: Diagnosis not present

## 2015-02-15 DIAGNOSIS — D631 Anemia in chronic kidney disease: Secondary | ICD-10-CM | POA: Diagnosis not present

## 2015-02-18 ENCOUNTER — Ambulatory Visit (INDEPENDENT_AMBULATORY_CARE_PROVIDER_SITE_OTHER): Payer: Medicare Other | Admitting: Podiatry

## 2015-02-18 ENCOUNTER — Encounter: Payer: Self-pay | Admitting: Podiatry

## 2015-02-18 DIAGNOSIS — B351 Tinea unguium: Secondary | ICD-10-CM | POA: Diagnosis not present

## 2015-02-18 DIAGNOSIS — M79676 Pain in unspecified toe(s): Secondary | ICD-10-CM

## 2015-02-18 DIAGNOSIS — L84 Corns and callosities: Secondary | ICD-10-CM

## 2015-02-18 DIAGNOSIS — E1129 Type 2 diabetes mellitus with other diabetic kidney complication: Secondary | ICD-10-CM | POA: Diagnosis not present

## 2015-02-18 DIAGNOSIS — E1151 Type 2 diabetes mellitus with diabetic peripheral angiopathy without gangrene: Secondary | ICD-10-CM

## 2015-02-18 DIAGNOSIS — N186 End stage renal disease: Secondary | ICD-10-CM | POA: Diagnosis not present

## 2015-02-18 DIAGNOSIS — N2581 Secondary hyperparathyroidism of renal origin: Secondary | ICD-10-CM | POA: Diagnosis not present

## 2015-02-18 DIAGNOSIS — I7025 Atherosclerosis of native arteries of other extremities with ulceration: Secondary | ICD-10-CM | POA: Diagnosis not present

## 2015-02-18 DIAGNOSIS — D631 Anemia in chronic kidney disease: Secondary | ICD-10-CM | POA: Diagnosis not present

## 2015-02-18 NOTE — Progress Notes (Signed)
Patient ID: [name], female   DOB: Feb 04, 1933, 81 y.o.   MRN: [id number]  Subjective: 81 y.o. returns the office today for painful, elongated, thickened toenails and calluses which makes it difficult to walk and she is unable to trim them herself. Denies any redness or drainage around the nails/calluses.Denies any acute changes since last appointment and no new complaints today. Denies any systemic complaints such as fevers, chills, nausea, vomiting.   Objective: AAO 3, NAD DP/PT pulses decreased Protective sensation decreased with Simms Weinstein monofilament Nails hypertrophic, dystrophic, elongated, brittle, discolored 9. There is tenderness overlying the nails 2-5 on the left and 1-5 on the right. There is no surrounding erythema or drainage along the nail sites. Hyperkeratotic lesions bilateral foot submetatarsal one and 5 and lateral 5th digit on the left. Upon debridement there was no underlying ulceration, drainage or other clinical signs of infection. No open lesions or pre-ulcerative lesions are identified. No other areas of tenderness bilateral lower extremities. No overlying edema, erythema, increased warmth. Previous partial auto-amputation of the left hallux which is healed at this time. No pain with calf compression, swelling, warmth, erythema.  Assessment: Patient presents with symptomatic onychomycosis; pre-ulcerative calluses    Plan: -Treatment options including alternatives, risks, complications were discussed -Nails sharply debrided 10 without complication/bleeding. -Hyperkeratotic lesion sharply debrided 5 without, complications/bleeding -Discussed daily foot inspection. If there are any changes, to call the office immediately.  -Follow-up in 3 months or sooner if any problems are to arise. In the meantime, encouraged to call the office with any questions, concerns, changes symptoms.  Ovid Curd, DPM

## 2015-02-20 DIAGNOSIS — E1129 Type 2 diabetes mellitus with other diabetic kidney complication: Secondary | ICD-10-CM | POA: Diagnosis not present

## 2015-02-20 DIAGNOSIS — N2581 Secondary hyperparathyroidism of renal origin: Secondary | ICD-10-CM | POA: Diagnosis not present

## 2015-02-20 DIAGNOSIS — D631 Anemia in chronic kidney disease: Secondary | ICD-10-CM | POA: Diagnosis not present

## 2015-02-20 DIAGNOSIS — N186 End stage renal disease: Secondary | ICD-10-CM | POA: Diagnosis not present

## 2015-02-22 DIAGNOSIS — N2581 Secondary hyperparathyroidism of renal origin: Secondary | ICD-10-CM | POA: Diagnosis not present

## 2015-02-22 DIAGNOSIS — D631 Anemia in chronic kidney disease: Secondary | ICD-10-CM | POA: Diagnosis not present

## 2015-02-22 DIAGNOSIS — E1129 Type 2 diabetes mellitus with other diabetic kidney complication: Secondary | ICD-10-CM | POA: Diagnosis not present

## 2015-02-22 DIAGNOSIS — N186 End stage renal disease: Secondary | ICD-10-CM | POA: Diagnosis not present

## 2015-02-25 DIAGNOSIS — D631 Anemia in chronic kidney disease: Secondary | ICD-10-CM | POA: Diagnosis not present

## 2015-02-25 DIAGNOSIS — E1129 Type 2 diabetes mellitus with other diabetic kidney complication: Secondary | ICD-10-CM | POA: Diagnosis not present

## 2015-02-25 DIAGNOSIS — N186 End stage renal disease: Secondary | ICD-10-CM | POA: Diagnosis not present

## 2015-02-25 DIAGNOSIS — N2581 Secondary hyperparathyroidism of renal origin: Secondary | ICD-10-CM | POA: Diagnosis not present

## 2015-02-27 DIAGNOSIS — N2581 Secondary hyperparathyroidism of renal origin: Secondary | ICD-10-CM | POA: Diagnosis not present

## 2015-02-27 DIAGNOSIS — D631 Anemia in chronic kidney disease: Secondary | ICD-10-CM | POA: Diagnosis not present

## 2015-02-27 DIAGNOSIS — E1129 Type 2 diabetes mellitus with other diabetic kidney complication: Secondary | ICD-10-CM | POA: Diagnosis not present

## 2015-02-27 DIAGNOSIS — N186 End stage renal disease: Secondary | ICD-10-CM | POA: Diagnosis not present

## 2015-02-28 ENCOUNTER — Encounter: Payer: Self-pay | Admitting: Vascular Surgery

## 2015-03-01 DIAGNOSIS — D631 Anemia in chronic kidney disease: Secondary | ICD-10-CM | POA: Diagnosis not present

## 2015-03-01 DIAGNOSIS — N186 End stage renal disease: Secondary | ICD-10-CM | POA: Diagnosis not present

## 2015-03-01 DIAGNOSIS — N2581 Secondary hyperparathyroidism of renal origin: Secondary | ICD-10-CM | POA: Diagnosis not present

## 2015-03-01 DIAGNOSIS — E1129 Type 2 diabetes mellitus with other diabetic kidney complication: Secondary | ICD-10-CM | POA: Diagnosis not present

## 2015-03-04 DIAGNOSIS — N2581 Secondary hyperparathyroidism of renal origin: Secondary | ICD-10-CM | POA: Diagnosis not present

## 2015-03-04 DIAGNOSIS — E1129 Type 2 diabetes mellitus with other diabetic kidney complication: Secondary | ICD-10-CM | POA: Diagnosis not present

## 2015-03-04 DIAGNOSIS — N186 End stage renal disease: Secondary | ICD-10-CM | POA: Diagnosis not present

## 2015-03-04 DIAGNOSIS — D631 Anemia in chronic kidney disease: Secondary | ICD-10-CM | POA: Diagnosis not present

## 2015-03-05 ENCOUNTER — Encounter: Payer: Self-pay | Admitting: Vascular Surgery

## 2015-03-05 ENCOUNTER — Ambulatory Visit (INDEPENDENT_AMBULATORY_CARE_PROVIDER_SITE_OTHER): Payer: Medicare Other | Admitting: Vascular Surgery

## 2015-03-05 VITALS — BP 177/85 | HR 65 | Temp 96.7°F | Resp 16 | Ht 62.0 in | Wt 193.0 lb

## 2015-03-05 DIAGNOSIS — N186 End stage renal disease: Secondary | ICD-10-CM | POA: Diagnosis not present

## 2015-03-05 DIAGNOSIS — Z01810 Encounter for preprocedural cardiovascular examination: Secondary | ICD-10-CM

## 2015-03-05 DIAGNOSIS — I739 Peripheral vascular disease, unspecified: Secondary | ICD-10-CM

## 2015-03-05 DIAGNOSIS — I7025 Atherosclerosis of native arteries of other extremities with ulceration: Secondary | ICD-10-CM

## 2015-03-05 NOTE — Addendum Note (Signed)
Addended by: Sharee PimpleMCCHESNEY, Danija Gosa K on: 03/05/2015 02:29 PM   Modules accepted: Orders

## 2015-03-05 NOTE — Progress Notes (Signed)
Established Dialysis Access  History of Present Illness  [name] is a 81 y.o. (03/14/1933) female who presents for re-evaluation for permanent access.  The patient is right hand dominant.  Previous access procedures have been completed in both arms and R leg.  The patient's complication from previous access procedures include: thrombosis.  Her last access R thigh AVG clotted a few months ago.  She has been undergoing HD via RIJV.  This patient has a prior history of PAD.    The patient's PMH, PSH, SH, and FamHx are unchanged from 11/27/14.  Current Outpatient Prescriptions  Medication Sig Dispense Refill  . acetaminophen (TYLENOL) 500 MG tablet Take 500-1,000 mg by mouth every 6 (six) hours as needed.    Marland Kitchen. aspirin 325 MG EC tablet Take 325 mg by mouth daily.    Marland Kitchen. gabapentin (NEURONTIN) 100 MG capsule Take 100 mg by mouth 2 (two) times daily.     Marland Kitchen. RENVELA 800 MG tablet     . SENSIPAR 30 MG tablet Take 30 mg by mouth every evening.     Marland Kitchen. doxycycline (MONODOX) 100 MG capsule Take 1 capsule by mouth 2 (two) times daily.    Marland Kitchen. ketorolac (ACULAR) 0.5 % ophthalmic solution   0  . lanthanum (FOSRENOL) 1000 MG chewable tablet Chew 1,000-2,000 mg by mouth 3 (three) times daily with meals. Take 2000 mg by mouth 3 times daily with meals and take 1000mg  by mouth with snacks.    . neomycin-polymyxin b-dexamethasone (MAXITROL) 3.5-10000-0.1 OINT   1  . omeprazole (PRILOSEC) 20 MG capsule Take 1 capsule (20 mg total) by mouth daily. (Patient not taking: Reported on 03/05/2015) 90 capsule 3  . prednisoLONE acetate (PRED FORTE) 1 % ophthalmic suspension   1  . PROAIR HFA 108 (90 BASE) MCG/ACT inhaler     . silver sulfADIAZINE (SILVADENE) 1 % cream Apply 1 application topically daily. (Patient not taking: Reported on 03/05/2015) 50 g 0  . traMADol (ULTRAM) 50 MG tablet Take 1 tablet (50 mg total) by mouth every 6 (six) hours as needed. (Patient not taking: Reported on 03/05/2015) 30 tablet 0  .  VIGAMOX 0.5 % ophthalmic solution   1   No current facility-administered medications for this visit.    Allergies  Allergen Reactions  . Codeine Hives  . Vancomycin Rash    On ROS today: no further wounds in either feet, denies rest pain   Physical Examination  Filed Vitals:   03/05/15 1157 03/05/15 1204  BP: 184/86 177/85  Pulse: 69 65  Temp: 96.7 F (35.9 C)   TempSrc: Oral   Resp: 16   Height: 5\' 2"  (1.575 m)   Weight: 193 lb (87.544 kg)   SpO2: 98%    Body mass index is 35.29 kg/(m^2).  General: A&O x 3, WD, WN, morbidly obese  Pulmonary: Sym exp, good air movt, CTAB, no rales, rhonchi, & wheezing  Cardiac: RRR, Nl S1, S2, no Murmurs, rubs or gallops  Vascular: Vessel Right Left  Radial Not Palpable Not Palpable  Ulnar Not Palpable Not Palpable  Brachial Not Palpable Not Palpable  L femoral faintly palpable, no palpable popliteal or pedal pulses  Gastrointestinal: soft, NTND, no G/R, bo HSM, no masses, no CVAT B  Musculoskeletal: M/S 5/5 throughout , BU Extremities without  ischemic changes, L foot without chronic skin changes consistent with PAD, no palpable thrill in R thigh, no bruit in access  Neurologic: Pain and light touch intact in extremities ,  Motor exam as listed above  Outside Studies/Documentation 4 pages of outside documents were reviewed including: nephrology chart.   Medical Decision Making  [name] is a 81 y.o. female who presents with ESRD requiring hemodialysis, history of CLI   Patient's needs additional non-invasive testing before considering L thigh AVG placement.  I normally get: BLE ABI, LLE arterial duplex, and vein mapping.  She will follow up after these tests are available in 3-4 weeks.  I suspect this patient is going to be a higher risk for development of L foot gangrene due to steal syndrome from a L thigh AVG.   Leonides Sake, MD [facility] Office: [phone] Pager:  [phone]  03/05/2015, 1:03 PM

## 2015-03-05 NOTE — Progress Notes (Signed)
Filed Vitals:   03/05/15 1157 03/05/15 1204  BP: 184/86 177/85  Pulse: 69 65  Temp: 96.7 F (35.9 C)   TempSrc: Oral   Resp: 16   Height: 5\' 2"  (1.575 m)   Weight: 193 lb (87.544 kg)   SpO2: 98%

## 2015-03-06 DIAGNOSIS — N186 End stage renal disease: Secondary | ICD-10-CM | POA: Diagnosis not present

## 2015-03-06 DIAGNOSIS — E1129 Type 2 diabetes mellitus with other diabetic kidney complication: Secondary | ICD-10-CM | POA: Diagnosis not present

## 2015-03-06 DIAGNOSIS — D631 Anemia in chronic kidney disease: Secondary | ICD-10-CM | POA: Diagnosis not present

## 2015-03-06 DIAGNOSIS — N2581 Secondary hyperparathyroidism of renal origin: Secondary | ICD-10-CM | POA: Diagnosis not present

## 2015-03-07 ENCOUNTER — Encounter: Payer: Self-pay | Admitting: Nephrology

## 2015-03-08 DIAGNOSIS — D631 Anemia in chronic kidney disease: Secondary | ICD-10-CM | POA: Diagnosis not present

## 2015-03-08 DIAGNOSIS — N186 End stage renal disease: Secondary | ICD-10-CM | POA: Diagnosis not present

## 2015-03-08 DIAGNOSIS — N2581 Secondary hyperparathyroidism of renal origin: Secondary | ICD-10-CM | POA: Diagnosis not present

## 2015-03-08 DIAGNOSIS — E1129 Type 2 diabetes mellitus with other diabetic kidney complication: Secondary | ICD-10-CM | POA: Diagnosis not present

## 2015-03-10 ENCOUNTER — Ambulatory Visit: Payer: Medicare Other | Admitting: Podiatry

## 2015-03-10 DIAGNOSIS — N186 End stage renal disease: Secondary | ICD-10-CM | POA: Diagnosis not present

## 2015-03-10 DIAGNOSIS — Z992 Dependence on renal dialysis: Secondary | ICD-10-CM | POA: Diagnosis not present

## 2015-03-10 DIAGNOSIS — E1129 Type 2 diabetes mellitus with other diabetic kidney complication: Secondary | ICD-10-CM | POA: Diagnosis not present

## 2015-03-11 DIAGNOSIS — N186 End stage renal disease: Secondary | ICD-10-CM | POA: Diagnosis not present

## 2015-03-11 DIAGNOSIS — D631 Anemia in chronic kidney disease: Secondary | ICD-10-CM | POA: Diagnosis not present

## 2015-03-11 DIAGNOSIS — D509 Iron deficiency anemia, unspecified: Secondary | ICD-10-CM | POA: Diagnosis not present

## 2015-03-11 DIAGNOSIS — E1129 Type 2 diabetes mellitus with other diabetic kidney complication: Secondary | ICD-10-CM | POA: Diagnosis not present

## 2015-03-11 DIAGNOSIS — N2581 Secondary hyperparathyroidism of renal origin: Secondary | ICD-10-CM | POA: Diagnosis not present

## 2015-03-13 DIAGNOSIS — N186 End stage renal disease: Secondary | ICD-10-CM | POA: Diagnosis not present

## 2015-03-13 DIAGNOSIS — D509 Iron deficiency anemia, unspecified: Secondary | ICD-10-CM | POA: Diagnosis not present

## 2015-03-13 DIAGNOSIS — E1129 Type 2 diabetes mellitus with other diabetic kidney complication: Secondary | ICD-10-CM | POA: Diagnosis not present

## 2015-03-13 DIAGNOSIS — N2581 Secondary hyperparathyroidism of renal origin: Secondary | ICD-10-CM | POA: Diagnosis not present

## 2015-03-13 DIAGNOSIS — D631 Anemia in chronic kidney disease: Secondary | ICD-10-CM | POA: Diagnosis not present

## 2015-03-15 DIAGNOSIS — E1129 Type 2 diabetes mellitus with other diabetic kidney complication: Secondary | ICD-10-CM | POA: Diagnosis not present

## 2015-03-15 DIAGNOSIS — N186 End stage renal disease: Secondary | ICD-10-CM | POA: Diagnosis not present

## 2015-03-15 DIAGNOSIS — D631 Anemia in chronic kidney disease: Secondary | ICD-10-CM | POA: Diagnosis not present

## 2015-03-15 DIAGNOSIS — N2581 Secondary hyperparathyroidism of renal origin: Secondary | ICD-10-CM | POA: Diagnosis not present

## 2015-03-15 DIAGNOSIS — D509 Iron deficiency anemia, unspecified: Secondary | ICD-10-CM | POA: Diagnosis not present

## 2015-03-18 DIAGNOSIS — D509 Iron deficiency anemia, unspecified: Secondary | ICD-10-CM | POA: Diagnosis not present

## 2015-03-18 DIAGNOSIS — D631 Anemia in chronic kidney disease: Secondary | ICD-10-CM | POA: Diagnosis not present

## 2015-03-18 DIAGNOSIS — E1129 Type 2 diabetes mellitus with other diabetic kidney complication: Secondary | ICD-10-CM | POA: Diagnosis not present

## 2015-03-18 DIAGNOSIS — N186 End stage renal disease: Secondary | ICD-10-CM | POA: Diagnosis not present

## 2015-03-18 DIAGNOSIS — N2581 Secondary hyperparathyroidism of renal origin: Secondary | ICD-10-CM | POA: Diagnosis not present

## 2015-03-20 DIAGNOSIS — D631 Anemia in chronic kidney disease: Secondary | ICD-10-CM | POA: Diagnosis not present

## 2015-03-20 DIAGNOSIS — E1129 Type 2 diabetes mellitus with other diabetic kidney complication: Secondary | ICD-10-CM | POA: Diagnosis not present

## 2015-03-20 DIAGNOSIS — N2581 Secondary hyperparathyroidism of renal origin: Secondary | ICD-10-CM | POA: Diagnosis not present

## 2015-03-20 DIAGNOSIS — D509 Iron deficiency anemia, unspecified: Secondary | ICD-10-CM | POA: Diagnosis not present

## 2015-03-20 DIAGNOSIS — N186 End stage renal disease: Secondary | ICD-10-CM | POA: Diagnosis not present

## 2015-03-22 DIAGNOSIS — D509 Iron deficiency anemia, unspecified: Secondary | ICD-10-CM | POA: Diagnosis not present

## 2015-03-22 DIAGNOSIS — E1129 Type 2 diabetes mellitus with other diabetic kidney complication: Secondary | ICD-10-CM | POA: Diagnosis not present

## 2015-03-22 DIAGNOSIS — N186 End stage renal disease: Secondary | ICD-10-CM | POA: Diagnosis not present

## 2015-03-22 DIAGNOSIS — N2581 Secondary hyperparathyroidism of renal origin: Secondary | ICD-10-CM | POA: Diagnosis not present

## 2015-03-22 DIAGNOSIS — D631 Anemia in chronic kidney disease: Secondary | ICD-10-CM | POA: Diagnosis not present

## 2015-03-25 DIAGNOSIS — E1129 Type 2 diabetes mellitus with other diabetic kidney complication: Secondary | ICD-10-CM | POA: Diagnosis not present

## 2015-03-25 DIAGNOSIS — N186 End stage renal disease: Secondary | ICD-10-CM | POA: Diagnosis not present

## 2015-03-25 DIAGNOSIS — N2581 Secondary hyperparathyroidism of renal origin: Secondary | ICD-10-CM | POA: Diagnosis not present

## 2015-03-25 DIAGNOSIS — D509 Iron deficiency anemia, unspecified: Secondary | ICD-10-CM | POA: Diagnosis not present

## 2015-03-25 DIAGNOSIS — D631 Anemia in chronic kidney disease: Secondary | ICD-10-CM | POA: Diagnosis not present

## 2015-03-27 DIAGNOSIS — D509 Iron deficiency anemia, unspecified: Secondary | ICD-10-CM | POA: Diagnosis not present

## 2015-03-27 DIAGNOSIS — E1129 Type 2 diabetes mellitus with other diabetic kidney complication: Secondary | ICD-10-CM | POA: Diagnosis not present

## 2015-03-27 DIAGNOSIS — D631 Anemia in chronic kidney disease: Secondary | ICD-10-CM | POA: Diagnosis not present

## 2015-03-27 DIAGNOSIS — N186 End stage renal disease: Secondary | ICD-10-CM | POA: Diagnosis not present

## 2015-03-27 DIAGNOSIS — N2581 Secondary hyperparathyroidism of renal origin: Secondary | ICD-10-CM | POA: Diagnosis not present

## 2015-03-29 DIAGNOSIS — D631 Anemia in chronic kidney disease: Secondary | ICD-10-CM | POA: Diagnosis not present

## 2015-03-29 DIAGNOSIS — E1129 Type 2 diabetes mellitus with other diabetic kidney complication: Secondary | ICD-10-CM | POA: Diagnosis not present

## 2015-03-29 DIAGNOSIS — N2581 Secondary hyperparathyroidism of renal origin: Secondary | ICD-10-CM | POA: Diagnosis not present

## 2015-03-29 DIAGNOSIS — D509 Iron deficiency anemia, unspecified: Secondary | ICD-10-CM | POA: Diagnosis not present

## 2015-03-29 DIAGNOSIS — N186 End stage renal disease: Secondary | ICD-10-CM | POA: Diagnosis not present

## 2015-03-31 ENCOUNTER — Ambulatory Visit (INDEPENDENT_AMBULATORY_CARE_PROVIDER_SITE_OTHER)
Admission: RE | Admit: 2015-03-31 | Discharge: 2015-03-31 | Disposition: A | Discharge status: Home / Self Care | Payer: Medicare Other | Source: Ambulatory Visit | Attending: Vascular Surgery | Admitting: Vascular Surgery

## 2015-03-31 ENCOUNTER — Ambulatory Visit (HOSPITAL_COMMUNITY)
Admission: RE | Admit: 2015-03-31 | Discharge: 2015-03-31 | Disposition: A | Discharge status: Home / Self Care | Payer: Medicare Other | Source: Ambulatory Visit | Attending: Vascular Surgery | Admitting: Vascular Surgery

## 2015-03-31 DIAGNOSIS — I739 Peripheral vascular disease, unspecified: Secondary | ICD-10-CM | POA: Insufficient documentation

## 2015-03-31 DIAGNOSIS — Z01810 Encounter for preprocedural cardiovascular examination: Secondary | ICD-10-CM

## 2015-03-31 DIAGNOSIS — N186 End stage renal disease: Secondary | ICD-10-CM | POA: Insufficient documentation

## 2015-04-01 DIAGNOSIS — D631 Anemia in chronic kidney disease: Secondary | ICD-10-CM | POA: Diagnosis not present

## 2015-04-01 DIAGNOSIS — D509 Iron deficiency anemia, unspecified: Secondary | ICD-10-CM | POA: Diagnosis not present

## 2015-04-01 DIAGNOSIS — N2581 Secondary hyperparathyroidism of renal origin: Secondary | ICD-10-CM | POA: Diagnosis not present

## 2015-04-01 DIAGNOSIS — N186 End stage renal disease: Secondary | ICD-10-CM | POA: Diagnosis not present

## 2015-04-01 DIAGNOSIS — E1129 Type 2 diabetes mellitus with other diabetic kidney complication: Secondary | ICD-10-CM | POA: Diagnosis not present

## 2015-04-04 DIAGNOSIS — D631 Anemia in chronic kidney disease: Secondary | ICD-10-CM | POA: Diagnosis not present

## 2015-04-04 DIAGNOSIS — D509 Iron deficiency anemia, unspecified: Secondary | ICD-10-CM | POA: Diagnosis not present

## 2015-04-04 DIAGNOSIS — E1129 Type 2 diabetes mellitus with other diabetic kidney complication: Secondary | ICD-10-CM | POA: Diagnosis not present

## 2015-04-04 DIAGNOSIS — N2581 Secondary hyperparathyroidism of renal origin: Secondary | ICD-10-CM | POA: Diagnosis not present

## 2015-04-04 DIAGNOSIS — N186 End stage renal disease: Secondary | ICD-10-CM | POA: Diagnosis not present

## 2015-04-05 DIAGNOSIS — N2581 Secondary hyperparathyroidism of renal origin: Secondary | ICD-10-CM | POA: Diagnosis not present

## 2015-04-05 DIAGNOSIS — D509 Iron deficiency anemia, unspecified: Secondary | ICD-10-CM | POA: Diagnosis not present

## 2015-04-05 DIAGNOSIS — E1129 Type 2 diabetes mellitus with other diabetic kidney complication: Secondary | ICD-10-CM | POA: Diagnosis not present

## 2015-04-05 DIAGNOSIS — N186 End stage renal disease: Secondary | ICD-10-CM | POA: Diagnosis not present

## 2015-04-05 DIAGNOSIS — D631 Anemia in chronic kidney disease: Secondary | ICD-10-CM | POA: Diagnosis not present

## 2015-04-08 DIAGNOSIS — N2581 Secondary hyperparathyroidism of renal origin: Secondary | ICD-10-CM | POA: Diagnosis not present

## 2015-04-08 DIAGNOSIS — D509 Iron deficiency anemia, unspecified: Secondary | ICD-10-CM | POA: Diagnosis not present

## 2015-04-08 DIAGNOSIS — E1129 Type 2 diabetes mellitus with other diabetic kidney complication: Secondary | ICD-10-CM | POA: Diagnosis not present

## 2015-04-08 DIAGNOSIS — N186 End stage renal disease: Secondary | ICD-10-CM | POA: Diagnosis not present

## 2015-04-08 DIAGNOSIS — D631 Anemia in chronic kidney disease: Secondary | ICD-10-CM | POA: Diagnosis not present

## 2015-04-09 DIAGNOSIS — Z992 Dependence on renal dialysis: Secondary | ICD-10-CM | POA: Diagnosis not present

## 2015-04-09 DIAGNOSIS — N186 End stage renal disease: Secondary | ICD-10-CM | POA: Diagnosis not present

## 2015-04-09 DIAGNOSIS — E1129 Type 2 diabetes mellitus with other diabetic kidney complication: Secondary | ICD-10-CM | POA: Diagnosis not present

## 2015-04-10 DIAGNOSIS — N2581 Secondary hyperparathyroidism of renal origin: Secondary | ICD-10-CM | POA: Diagnosis not present

## 2015-04-10 DIAGNOSIS — D509 Iron deficiency anemia, unspecified: Secondary | ICD-10-CM | POA: Diagnosis not present

## 2015-04-10 DIAGNOSIS — D631 Anemia in chronic kidney disease: Secondary | ICD-10-CM | POA: Diagnosis not present

## 2015-04-10 DIAGNOSIS — N186 End stage renal disease: Secondary | ICD-10-CM | POA: Diagnosis not present

## 2015-04-10 DIAGNOSIS — E1129 Type 2 diabetes mellitus with other diabetic kidney complication: Secondary | ICD-10-CM | POA: Diagnosis not present

## 2015-04-11 ENCOUNTER — Encounter: Payer: Self-pay | Admitting: Vascular Surgery

## 2015-04-12 DIAGNOSIS — N2581 Secondary hyperparathyroidism of renal origin: Secondary | ICD-10-CM | POA: Diagnosis not present

## 2015-04-12 DIAGNOSIS — D509 Iron deficiency anemia, unspecified: Secondary | ICD-10-CM | POA: Diagnosis not present

## 2015-04-12 DIAGNOSIS — D631 Anemia in chronic kidney disease: Secondary | ICD-10-CM | POA: Diagnosis not present

## 2015-04-12 DIAGNOSIS — N186 End stage renal disease: Secondary | ICD-10-CM | POA: Diagnosis not present

## 2015-04-12 DIAGNOSIS — E1129 Type 2 diabetes mellitus with other diabetic kidney complication: Secondary | ICD-10-CM | POA: Diagnosis not present

## 2015-04-15 DIAGNOSIS — D631 Anemia in chronic kidney disease: Secondary | ICD-10-CM | POA: Diagnosis not present

## 2015-04-15 DIAGNOSIS — D509 Iron deficiency anemia, unspecified: Secondary | ICD-10-CM | POA: Diagnosis not present

## 2015-04-15 DIAGNOSIS — E1129 Type 2 diabetes mellitus with other diabetic kidney complication: Secondary | ICD-10-CM | POA: Diagnosis not present

## 2015-04-15 DIAGNOSIS — N2581 Secondary hyperparathyroidism of renal origin: Secondary | ICD-10-CM | POA: Diagnosis not present

## 2015-04-15 DIAGNOSIS — N186 End stage renal disease: Secondary | ICD-10-CM | POA: Diagnosis not present

## 2015-04-17 DIAGNOSIS — N2581 Secondary hyperparathyroidism of renal origin: Secondary | ICD-10-CM | POA: Diagnosis not present

## 2015-04-17 DIAGNOSIS — D509 Iron deficiency anemia, unspecified: Secondary | ICD-10-CM | POA: Diagnosis not present

## 2015-04-17 DIAGNOSIS — E1129 Type 2 diabetes mellitus with other diabetic kidney complication: Secondary | ICD-10-CM | POA: Diagnosis not present

## 2015-04-17 DIAGNOSIS — D631 Anemia in chronic kidney disease: Secondary | ICD-10-CM | POA: Diagnosis not present

## 2015-04-17 DIAGNOSIS — N186 End stage renal disease: Secondary | ICD-10-CM | POA: Diagnosis not present

## 2015-04-18 ENCOUNTER — Encounter: Payer: Self-pay | Admitting: Vascular Surgery

## 2015-04-18 ENCOUNTER — Ambulatory Visit (INDEPENDENT_AMBULATORY_CARE_PROVIDER_SITE_OTHER): Payer: Medicare Other | Admitting: Vascular Surgery

## 2015-04-18 VITALS — BP 130/78 | HR 76 | Temp 98.3°F | Resp 16 | Ht 63.0 in | Wt 194.0 lb

## 2015-04-18 DIAGNOSIS — N186 End stage renal disease: Secondary | ICD-10-CM | POA: Diagnosis not present

## 2015-04-18 DIAGNOSIS — I7025 Atherosclerosis of native arteries of other extremities with ulceration: Secondary | ICD-10-CM | POA: Diagnosis not present

## 2015-04-18 DIAGNOSIS — Z992 Dependence on renal dialysis: Secondary | ICD-10-CM | POA: Diagnosis not present

## 2015-04-18 NOTE — Progress Notes (Signed)
Established Dialysis Access  History of Present Illness  [name] is a 81 y.o. (1933/05/06) female s/p prior L great toe partial amputation who presents for re-evaluation for permanent access.  The patient is right hand dominant.  Previous access procedures have been completed in both arms.  The patient's complication from previous access procedures include: thrombosis.  The patient has previously had HeRO graft-cath placed without success.  Her las access was a R thigh AVG which lasted one year, during which multiple percutaneous interventions were needed.  The patient current does HD via a [facility] TDC.  The patient has never had a previous PPM placed.  The patient's PMH, PSH, SH, and FamHx are unchanged from 03/05/15.  Current Outpatient Prescriptions  Medication Sig Dispense Refill  . acetaminophen (TYLENOL) 500 MG tablet Take 500-1,000 mg by mouth every 6 (six) hours as needed.    Marland Kitchen aspirin 325 MG EC tablet Take 325 mg by mouth daily.    Marland Kitchen doxycycline (MONODOX) 100 MG capsule Take 1 capsule by mouth 2 (two) times daily.    Marland Kitchen gabapentin (NEURONTIN) 100 MG capsule Take 100 mg by mouth 2 (two) times daily.     Marland Kitchen ketorolac (ACULAR) 0.5 % ophthalmic solution   0  . lanthanum (FOSRENOL) 1000 MG chewable tablet Chew 1,000-2,000 mg by mouth 3 (three) times daily with meals. Take 2000 mg by mouth 3 times daily with meals and take  by mouth with snacks.    . neomycin-polymyxin b-dexamethasone (MAXITROL) 3.5-10000-0.1 OINT   1  . omeprazole (PRILOSEC) 20 MG capsule Take 1 capsule (20 mg total) by mouth daily. 90 capsule 3  . prednisoLONE acetate (PRED FORTE) 1 % ophthalmic suspension   1  . PROAIR HFA 108 (90 BASE) MCG/ACT inhaler     . RENVELA 800 MG tablet     . SENSIPAR 30 MG tablet Take 30 mg by mouth every evening.     . silver sulfADIAZINE (SILVADENE) 1 % cream Apply 1 application topically daily. 50 g 0  . traMADol (ULTRAM) 50 MG tablet Take 1 tablet (50 mg total) by mouth every 6  (six) hours as needed. 30 tablet 0  . VIGAMOX 0.5 % ophthalmic solution   1   No current facility-administered medications for this visit.    Allergies  Allergen Reactions  . Codeine Hives  . Vancomycin Rash    On ROS today: no intermittent claudication , no wound in left foot   Physical Examination  Filed Vitals:   04/18/15 1126  BP: 130/78  Pulse: 76  Temp: 98.3 F (36.8 C)  TempSrc: Oral  Resp: 16  Height:  (1.6 m)  Weight: 194 lb (87.998 kg)  SpO2: 100%   Body mass index is 34.37 kg/(m^2).  General: A&O x 3, WD, morbidly obese  Pulmonary: Sym exp, good air movt, CTAB, no rales, rhonchi, & wheezing  Cardiac: RRR, Nl S1, S2, no Murmurs, rubs or gallops  Vascular: Vessel Right Left  Radial Not Palpable Not Palpable  Ulnar Not Palpable Not Palpable  Brachial Not Palpable Not Palpable   Gastrointestinal: soft, NTND, no G/R, bo HSM, no masses, no CVAT B  Musculoskeletal: M/S 5/5 throughout , Extremities without ischemic changes except for heal distal phalanage amputation L great toe  Neurologic: Pain and light touch intact in extremities , Motor exam as listed above  Non-Invasive Vascular Imaging  ABI (Date: 03/31/15)  R:   ABI: 0.99,   DP: [place],   PT: mono,  TBI: 0339  L:   ABI: [place],   DP: mono,   PT: barely biphasic,   TBI: ND  RLE Arterial Duplex (03/31/15)  Biphasic down to popliteal artery  Monophasic tibial arteries  LLE GSV mapping (03/31/15)  Only proximal segment adequate   Medical Decision Making  [name] is a 81 y.o. female who presents with ESRD requiring hemodialysis, BLE PAD   Based on vein mapping and examination, this patient's permanent access options include: L thigh AVG.  Obviously with patient's prior amputation, she is at risk for steal syndrome resulting in foot ischemia leading to amputation.  I had an extensive discussion with this patient in regards to the nature of access surgery, including  risk, benefits, and alternatives.    The patient is aware that the risks of access surgery include but are not limited to: bleeding, infection, steal syndrome, nerve damage, ischemic monomelic neuropathy, failure of access to mature, and possible need for additional access procedures in the future.  The patient has NOT agreed to proceed with the above procedure.  [facility]  Leonides SakeBrian Chen, MD [facility] [place] Office: [phone] Pager: [phone]  04/18/2015, 12:14 PM

## 2015-04-19 DIAGNOSIS — E1129 Type 2 diabetes mellitus with other diabetic kidney complication: Secondary | ICD-10-CM | POA: Diagnosis not present

## 2015-04-19 DIAGNOSIS — D509 Iron deficiency anemia, unspecified: Secondary | ICD-10-CM | POA: Diagnosis not present

## 2015-04-19 DIAGNOSIS — N2581 Secondary hyperparathyroidism of renal origin: Secondary | ICD-10-CM | POA: Diagnosis not present

## 2015-04-19 DIAGNOSIS — D631 Anemia in chronic kidney disease: Secondary | ICD-10-CM | POA: Diagnosis not present

## 2015-04-19 DIAGNOSIS — N186 End stage renal disease: Secondary | ICD-10-CM | POA: Diagnosis not present

## 2015-04-22 DIAGNOSIS — N2581 Secondary hyperparathyroidism of renal origin: Secondary | ICD-10-CM | POA: Diagnosis not present

## 2015-04-22 DIAGNOSIS — D631 Anemia in chronic kidney disease: Secondary | ICD-10-CM | POA: Diagnosis not present

## 2015-04-22 DIAGNOSIS — N186 End stage renal disease: Secondary | ICD-10-CM | POA: Diagnosis not present

## 2015-04-22 DIAGNOSIS — E1129 Type 2 diabetes mellitus with other diabetic kidney complication: Secondary | ICD-10-CM | POA: Diagnosis not present

## 2015-04-22 DIAGNOSIS — D509 Iron deficiency anemia, unspecified: Secondary | ICD-10-CM | POA: Diagnosis not present

## 2015-04-24 DIAGNOSIS — D631 Anemia in chronic kidney disease: Secondary | ICD-10-CM | POA: Diagnosis not present

## 2015-04-24 DIAGNOSIS — D509 Iron deficiency anemia, unspecified: Secondary | ICD-10-CM | POA: Diagnosis not present

## 2015-04-24 DIAGNOSIS — E1129 Type 2 diabetes mellitus with other diabetic kidney complication: Secondary | ICD-10-CM | POA: Diagnosis not present

## 2015-04-24 DIAGNOSIS — N186 End stage renal disease: Secondary | ICD-10-CM | POA: Diagnosis not present

## 2015-04-24 DIAGNOSIS — N2581 Secondary hyperparathyroidism of renal origin: Secondary | ICD-10-CM | POA: Diagnosis not present

## 2015-04-26 DIAGNOSIS — N186 End stage renal disease: Secondary | ICD-10-CM | POA: Diagnosis not present

## 2015-04-26 DIAGNOSIS — E1129 Type 2 diabetes mellitus with other diabetic kidney complication: Secondary | ICD-10-CM | POA: Diagnosis not present

## 2015-04-26 DIAGNOSIS — D631 Anemia in chronic kidney disease: Secondary | ICD-10-CM | POA: Diagnosis not present

## 2015-04-26 DIAGNOSIS — D509 Iron deficiency anemia, unspecified: Secondary | ICD-10-CM | POA: Diagnosis not present

## 2015-04-26 DIAGNOSIS — N2581 Secondary hyperparathyroidism of renal origin: Secondary | ICD-10-CM | POA: Diagnosis not present

## 2015-04-28 ENCOUNTER — Ambulatory Visit (INDEPENDENT_AMBULATORY_CARE_PROVIDER_SITE_OTHER): Payer: Medicare Other | Admitting: Podiatry

## 2015-04-28 DIAGNOSIS — E1151 Type 2 diabetes mellitus with diabetic peripheral angiopathy without gangrene: Secondary | ICD-10-CM

## 2015-04-28 DIAGNOSIS — Q828 Other specified congenital malformations of skin: Secondary | ICD-10-CM

## 2015-04-28 DIAGNOSIS — B351 Tinea unguium: Secondary | ICD-10-CM | POA: Diagnosis not present

## 2015-04-28 DIAGNOSIS — M79676 Pain in unspecified toe(s): Secondary | ICD-10-CM

## 2015-04-28 DIAGNOSIS — I739 Peripheral vascular disease, unspecified: Secondary | ICD-10-CM

## 2015-04-28 NOTE — Progress Notes (Signed)
Patient ID: Jamie LippsBetty G Kijowski, female   DOB: 09/10/1932, 81 y.o.   MRN: [id number]  Subjective: 81 y.o. returns the office today for painful, elongated, thickened toenails and calluses which makes it difficult to walk and she is unable to trim them herself. Denies any redness or drainage around the nails/calluses.Denies any acute changes since last appointment and no new complaints today. Denies any systemic complaints such as fevers, chills, nausea, vomiting.   Objective: AAO 3, NAD DP/PT pulses decreased Protective sensation decreased with Simms Weinstein monofilament Nails hypertrophic, dystrophic, elongated, brittle, discolored 9. There is tenderness overlying the nails 2-5 on the left and 1-5 on the right. There is no surrounding erythema or drainage along the nail sites. Hyperkeratotic lesions bilateral foot submetatarsal one and 5 and lateral 5th digit on the left but they do not appear to be as thick as previous. Upon debridement there was no underlying ulceration, drainage or other clinical signs of infection. Hyperkeratotic lesion also present left lateral fifth toe. Upon debridement no underlying ulceration, drainage or other signs of infection. No open lesions or pre-ulcerative lesions are identified. No other areas of tenderness bilateral lower extremities. No overlying edema, erythema, increased warmth. Previous partial auto-amputation of the left hallux which is healed at this time. No pain with calf compression, swelling, warmth, erythema.  Assessment: Patient presents with symptomatic onychomycosis; pre-ulcerative calluses    Plan: -Treatment options including alternatives, risks, complications were discussed -Nails sharply debrided 9 without complication/bleeding. -Hyperkeratotic lesion sharply debrided 3 without, complications/bleeding -Discussed daily foot inspection. If there are any changes, to call the office immediately.  -Follow-up in 3 months or sooner if any problems  are to arise. In the meantime, encouraged to call the office with any questions, concerns, changes symptoms.  Ovid CurdMatthew Wagoner, DPM

## 2015-04-29 DIAGNOSIS — I739 Peripheral vascular disease, unspecified: Secondary | ICD-10-CM | POA: Insufficient documentation

## 2015-04-29 DIAGNOSIS — N2581 Secondary hyperparathyroidism of renal origin: Secondary | ICD-10-CM | POA: Diagnosis not present

## 2015-04-29 DIAGNOSIS — E1129 Type 2 diabetes mellitus with other diabetic kidney complication: Secondary | ICD-10-CM | POA: Diagnosis not present

## 2015-04-29 DIAGNOSIS — N186 End stage renal disease: Secondary | ICD-10-CM | POA: Diagnosis not present

## 2015-04-29 DIAGNOSIS — D631 Anemia in chronic kidney disease: Secondary | ICD-10-CM | POA: Diagnosis not present

## 2015-04-29 DIAGNOSIS — D509 Iron deficiency anemia, unspecified: Secondary | ICD-10-CM | POA: Diagnosis not present

## 2015-05-01 DIAGNOSIS — E1129 Type 2 diabetes mellitus with other diabetic kidney complication: Secondary | ICD-10-CM | POA: Diagnosis not present

## 2015-05-01 DIAGNOSIS — N186 End stage renal disease: Secondary | ICD-10-CM | POA: Diagnosis not present

## 2015-05-01 DIAGNOSIS — N2581 Secondary hyperparathyroidism of renal origin: Secondary | ICD-10-CM | POA: Diagnosis not present

## 2015-05-01 DIAGNOSIS — D631 Anemia in chronic kidney disease: Secondary | ICD-10-CM | POA: Diagnosis not present

## 2015-05-01 DIAGNOSIS — D509 Iron deficiency anemia, unspecified: Secondary | ICD-10-CM | POA: Diagnosis not present

## 2015-05-03 DIAGNOSIS — D509 Iron deficiency anemia, unspecified: Secondary | ICD-10-CM | POA: Diagnosis not present

## 2015-05-03 DIAGNOSIS — D631 Anemia in chronic kidney disease: Secondary | ICD-10-CM | POA: Diagnosis not present

## 2015-05-03 DIAGNOSIS — N186 End stage renal disease: Secondary | ICD-10-CM | POA: Diagnosis not present

## 2015-05-03 DIAGNOSIS — E1129 Type 2 diabetes mellitus with other diabetic kidney complication: Secondary | ICD-10-CM | POA: Diagnosis not present

## 2015-05-03 DIAGNOSIS — N2581 Secondary hyperparathyroidism of renal origin: Secondary | ICD-10-CM | POA: Diagnosis not present

## 2015-05-06 DIAGNOSIS — N186 End stage renal disease: Secondary | ICD-10-CM | POA: Diagnosis not present

## 2015-05-06 DIAGNOSIS — D509 Iron deficiency anemia, unspecified: Secondary | ICD-10-CM | POA: Diagnosis not present

## 2015-05-06 DIAGNOSIS — E1129 Type 2 diabetes mellitus with other diabetic kidney complication: Secondary | ICD-10-CM | POA: Diagnosis not present

## 2015-05-06 DIAGNOSIS — N2581 Secondary hyperparathyroidism of renal origin: Secondary | ICD-10-CM | POA: Diagnosis not present

## 2015-05-06 DIAGNOSIS — D631 Anemia in chronic kidney disease: Secondary | ICD-10-CM | POA: Diagnosis not present

## 2015-05-08 DIAGNOSIS — D509 Iron deficiency anemia, unspecified: Secondary | ICD-10-CM | POA: Diagnosis not present

## 2015-05-08 DIAGNOSIS — N186 End stage renal disease: Secondary | ICD-10-CM | POA: Diagnosis not present

## 2015-05-08 DIAGNOSIS — D631 Anemia in chronic kidney disease: Secondary | ICD-10-CM | POA: Diagnosis not present

## 2015-05-08 DIAGNOSIS — E1129 Type 2 diabetes mellitus with other diabetic kidney complication: Secondary | ICD-10-CM | POA: Diagnosis not present

## 2015-05-08 DIAGNOSIS — N2581 Secondary hyperparathyroidism of renal origin: Secondary | ICD-10-CM | POA: Diagnosis not present

## 2015-05-10 DIAGNOSIS — D631 Anemia in chronic kidney disease: Secondary | ICD-10-CM | POA: Diagnosis not present

## 2015-05-10 DIAGNOSIS — N2581 Secondary hyperparathyroidism of renal origin: Secondary | ICD-10-CM | POA: Diagnosis not present

## 2015-05-10 DIAGNOSIS — Z992 Dependence on renal dialysis: Secondary | ICD-10-CM | POA: Diagnosis not present

## 2015-05-10 DIAGNOSIS — E1129 Type 2 diabetes mellitus with other diabetic kidney complication: Secondary | ICD-10-CM | POA: Diagnosis not present

## 2015-05-10 DIAGNOSIS — N186 End stage renal disease: Secondary | ICD-10-CM | POA: Diagnosis not present

## 2015-05-10 DIAGNOSIS — D509 Iron deficiency anemia, unspecified: Secondary | ICD-10-CM | POA: Diagnosis not present

## 2015-05-13 DIAGNOSIS — E1129 Type 2 diabetes mellitus with other diabetic kidney complication: Secondary | ICD-10-CM | POA: Diagnosis not present

## 2015-05-13 DIAGNOSIS — N186 End stage renal disease: Secondary | ICD-10-CM | POA: Diagnosis not present

## 2015-05-13 DIAGNOSIS — N2581 Secondary hyperparathyroidism of renal origin: Secondary | ICD-10-CM | POA: Diagnosis not present

## 2015-05-13 DIAGNOSIS — D631 Anemia in chronic kidney disease: Secondary | ICD-10-CM | POA: Diagnosis not present

## 2015-05-13 DIAGNOSIS — D509 Iron deficiency anemia, unspecified: Secondary | ICD-10-CM | POA: Diagnosis not present

## 2015-05-15 DIAGNOSIS — E1129 Type 2 diabetes mellitus with other diabetic kidney complication: Secondary | ICD-10-CM | POA: Diagnosis not present

## 2015-05-15 DIAGNOSIS — D631 Anemia in chronic kidney disease: Secondary | ICD-10-CM | POA: Diagnosis not present

## 2015-05-15 DIAGNOSIS — N2581 Secondary hyperparathyroidism of renal origin: Secondary | ICD-10-CM | POA: Diagnosis not present

## 2015-05-15 DIAGNOSIS — N186 End stage renal disease: Secondary | ICD-10-CM | POA: Diagnosis not present

## 2015-05-15 DIAGNOSIS — D509 Iron deficiency anemia, unspecified: Secondary | ICD-10-CM | POA: Diagnosis not present

## 2015-05-17 DIAGNOSIS — N186 End stage renal disease: Secondary | ICD-10-CM | POA: Diagnosis not present

## 2015-05-17 DIAGNOSIS — N2581 Secondary hyperparathyroidism of renal origin: Secondary | ICD-10-CM | POA: Diagnosis not present

## 2015-05-17 DIAGNOSIS — D509 Iron deficiency anemia, unspecified: Secondary | ICD-10-CM | POA: Diagnosis not present

## 2015-05-17 DIAGNOSIS — D631 Anemia in chronic kidney disease: Secondary | ICD-10-CM | POA: Diagnosis not present

## 2015-05-17 DIAGNOSIS — E1129 Type 2 diabetes mellitus with other diabetic kidney complication: Secondary | ICD-10-CM | POA: Diagnosis not present

## 2015-05-20 DIAGNOSIS — D509 Iron deficiency anemia, unspecified: Secondary | ICD-10-CM | POA: Diagnosis not present

## 2015-05-20 DIAGNOSIS — N2581 Secondary hyperparathyroidism of renal origin: Secondary | ICD-10-CM | POA: Diagnosis not present

## 2015-05-20 DIAGNOSIS — D631 Anemia in chronic kidney disease: Secondary | ICD-10-CM | POA: Diagnosis not present

## 2015-05-20 DIAGNOSIS — N186 End stage renal disease: Secondary | ICD-10-CM | POA: Diagnosis not present

## 2015-05-20 DIAGNOSIS — E1129 Type 2 diabetes mellitus with other diabetic kidney complication: Secondary | ICD-10-CM | POA: Diagnosis not present

## 2015-05-22 DIAGNOSIS — N186 End stage renal disease: Secondary | ICD-10-CM | POA: Diagnosis not present

## 2015-05-22 DIAGNOSIS — D631 Anemia in chronic kidney disease: Secondary | ICD-10-CM | POA: Diagnosis not present

## 2015-05-22 DIAGNOSIS — N2581 Secondary hyperparathyroidism of renal origin: Secondary | ICD-10-CM | POA: Diagnosis not present

## 2015-05-22 DIAGNOSIS — D509 Iron deficiency anemia, unspecified: Secondary | ICD-10-CM | POA: Diagnosis not present

## 2015-05-22 DIAGNOSIS — E1129 Type 2 diabetes mellitus with other diabetic kidney complication: Secondary | ICD-10-CM | POA: Diagnosis not present

## 2015-05-24 DIAGNOSIS — E1129 Type 2 diabetes mellitus with other diabetic kidney complication: Secondary | ICD-10-CM | POA: Diagnosis not present

## 2015-05-24 DIAGNOSIS — D631 Anemia in chronic kidney disease: Secondary | ICD-10-CM | POA: Diagnosis not present

## 2015-05-24 DIAGNOSIS — N186 End stage renal disease: Secondary | ICD-10-CM | POA: Diagnosis not present

## 2015-05-24 DIAGNOSIS — N2581 Secondary hyperparathyroidism of renal origin: Secondary | ICD-10-CM | POA: Diagnosis not present

## 2015-05-24 DIAGNOSIS — D509 Iron deficiency anemia, unspecified: Secondary | ICD-10-CM | POA: Diagnosis not present

## 2015-05-27 DIAGNOSIS — D631 Anemia in chronic kidney disease: Secondary | ICD-10-CM | POA: Diagnosis not present

## 2015-05-27 DIAGNOSIS — E1129 Type 2 diabetes mellitus with other diabetic kidney complication: Secondary | ICD-10-CM | POA: Diagnosis not present

## 2015-05-27 DIAGNOSIS — N2581 Secondary hyperparathyroidism of renal origin: Secondary | ICD-10-CM | POA: Diagnosis not present

## 2015-05-27 DIAGNOSIS — N186 End stage renal disease: Secondary | ICD-10-CM | POA: Diagnosis not present

## 2015-05-27 DIAGNOSIS — D509 Iron deficiency anemia, unspecified: Secondary | ICD-10-CM | POA: Diagnosis not present

## 2015-05-29 DIAGNOSIS — D509 Iron deficiency anemia, unspecified: Secondary | ICD-10-CM | POA: Diagnosis not present

## 2015-05-29 DIAGNOSIS — E1129 Type 2 diabetes mellitus with other diabetic kidney complication: Secondary | ICD-10-CM | POA: Diagnosis not present

## 2015-05-29 DIAGNOSIS — D631 Anemia in chronic kidney disease: Secondary | ICD-10-CM | POA: Diagnosis not present

## 2015-05-29 DIAGNOSIS — N186 End stage renal disease: Secondary | ICD-10-CM | POA: Diagnosis not present

## 2015-05-29 DIAGNOSIS — N2581 Secondary hyperparathyroidism of renal origin: Secondary | ICD-10-CM | POA: Diagnosis not present

## 2015-05-30 ENCOUNTER — Telehealth: Payer: Self-pay

## 2015-05-30 NOTE — Telephone Encounter (Signed)
Phone call to pt. Re: scheduling procedure for insertion of left thigh AVG, as was recommended @ office visit 04/18/15; left message to call office to schedule.

## 2015-05-31 DIAGNOSIS — E1129 Type 2 diabetes mellitus with other diabetic kidney complication: Secondary | ICD-10-CM | POA: Diagnosis not present

## 2015-05-31 DIAGNOSIS — N186 End stage renal disease: Secondary | ICD-10-CM | POA: Diagnosis not present

## 2015-05-31 DIAGNOSIS — N2581 Secondary hyperparathyroidism of renal origin: Secondary | ICD-10-CM | POA: Diagnosis not present

## 2015-05-31 DIAGNOSIS — D509 Iron deficiency anemia, unspecified: Secondary | ICD-10-CM | POA: Diagnosis not present

## 2015-05-31 DIAGNOSIS — D631 Anemia in chronic kidney disease: Secondary | ICD-10-CM | POA: Diagnosis not present

## 2015-06-03 DIAGNOSIS — E1129 Type 2 diabetes mellitus with other diabetic kidney complication: Secondary | ICD-10-CM | POA: Diagnosis not present

## 2015-06-03 DIAGNOSIS — N186 End stage renal disease: Secondary | ICD-10-CM | POA: Diagnosis not present

## 2015-06-03 DIAGNOSIS — D631 Anemia in chronic kidney disease: Secondary | ICD-10-CM | POA: Diagnosis not present

## 2015-06-03 DIAGNOSIS — N2581 Secondary hyperparathyroidism of renal origin: Secondary | ICD-10-CM | POA: Diagnosis not present

## 2015-06-03 DIAGNOSIS — D509 Iron deficiency anemia, unspecified: Secondary | ICD-10-CM | POA: Diagnosis not present

## 2015-06-05 DIAGNOSIS — N186 End stage renal disease: Secondary | ICD-10-CM | POA: Diagnosis not present

## 2015-06-05 DIAGNOSIS — D631 Anemia in chronic kidney disease: Secondary | ICD-10-CM | POA: Diagnosis not present

## 2015-06-05 DIAGNOSIS — D509 Iron deficiency anemia, unspecified: Secondary | ICD-10-CM | POA: Diagnosis not present

## 2015-06-05 DIAGNOSIS — N2581 Secondary hyperparathyroidism of renal origin: Secondary | ICD-10-CM | POA: Diagnosis not present

## 2015-06-05 DIAGNOSIS — E1129 Type 2 diabetes mellitus with other diabetic kidney complication: Secondary | ICD-10-CM | POA: Diagnosis not present

## 2015-06-07 DIAGNOSIS — E1129 Type 2 diabetes mellitus with other diabetic kidney complication: Secondary | ICD-10-CM | POA: Diagnosis not present

## 2015-06-07 DIAGNOSIS — N186 End stage renal disease: Secondary | ICD-10-CM | POA: Diagnosis not present

## 2015-06-07 DIAGNOSIS — N2581 Secondary hyperparathyroidism of renal origin: Secondary | ICD-10-CM | POA: Diagnosis not present

## 2015-06-07 DIAGNOSIS — D631 Anemia in chronic kidney disease: Secondary | ICD-10-CM | POA: Diagnosis not present

## 2015-06-07 DIAGNOSIS — D509 Iron deficiency anemia, unspecified: Secondary | ICD-10-CM | POA: Diagnosis not present

## 2015-06-10 DIAGNOSIS — D509 Iron deficiency anemia, unspecified: Secondary | ICD-10-CM | POA: Diagnosis not present

## 2015-06-10 DIAGNOSIS — N186 End stage renal disease: Secondary | ICD-10-CM | POA: Diagnosis not present

## 2015-06-10 DIAGNOSIS — D631 Anemia in chronic kidney disease: Secondary | ICD-10-CM | POA: Diagnosis not present

## 2015-06-10 DIAGNOSIS — Z992 Dependence on renal dialysis: Secondary | ICD-10-CM | POA: Diagnosis not present

## 2015-06-10 DIAGNOSIS — E1129 Type 2 diabetes mellitus with other diabetic kidney complication: Secondary | ICD-10-CM | POA: Diagnosis not present

## 2015-06-10 DIAGNOSIS — N2581 Secondary hyperparathyroidism of renal origin: Secondary | ICD-10-CM | POA: Diagnosis not present

## 2015-06-12 DIAGNOSIS — N2581 Secondary hyperparathyroidism of renal origin: Secondary | ICD-10-CM | POA: Diagnosis not present

## 2015-06-12 DIAGNOSIS — E1129 Type 2 diabetes mellitus with other diabetic kidney complication: Secondary | ICD-10-CM | POA: Diagnosis not present

## 2015-06-12 DIAGNOSIS — N186 End stage renal disease: Secondary | ICD-10-CM | POA: Diagnosis not present

## 2015-06-12 DIAGNOSIS — D509 Iron deficiency anemia, unspecified: Secondary | ICD-10-CM | POA: Diagnosis not present

## 2015-06-12 DIAGNOSIS — D631 Anemia in chronic kidney disease: Secondary | ICD-10-CM | POA: Diagnosis not present

## 2015-06-14 DIAGNOSIS — N186 End stage renal disease: Secondary | ICD-10-CM | POA: Diagnosis not present

## 2015-06-14 DIAGNOSIS — E1129 Type 2 diabetes mellitus with other diabetic kidney complication: Secondary | ICD-10-CM | POA: Diagnosis not present

## 2015-06-14 DIAGNOSIS — D631 Anemia in chronic kidney disease: Secondary | ICD-10-CM | POA: Diagnosis not present

## 2015-06-14 DIAGNOSIS — D509 Iron deficiency anemia, unspecified: Secondary | ICD-10-CM | POA: Diagnosis not present

## 2015-06-14 DIAGNOSIS — N2581 Secondary hyperparathyroidism of renal origin: Secondary | ICD-10-CM | POA: Diagnosis not present

## 2015-06-17 DIAGNOSIS — E1129 Type 2 diabetes mellitus with other diabetic kidney complication: Secondary | ICD-10-CM | POA: Diagnosis not present

## 2015-06-17 DIAGNOSIS — D509 Iron deficiency anemia, unspecified: Secondary | ICD-10-CM | POA: Diagnosis not present

## 2015-06-17 DIAGNOSIS — N186 End stage renal disease: Secondary | ICD-10-CM | POA: Diagnosis not present

## 2015-06-17 DIAGNOSIS — N2581 Secondary hyperparathyroidism of renal origin: Secondary | ICD-10-CM | POA: Diagnosis not present

## 2015-06-17 DIAGNOSIS — D631 Anemia in chronic kidney disease: Secondary | ICD-10-CM | POA: Diagnosis not present

## 2015-06-19 DIAGNOSIS — N186 End stage renal disease: Secondary | ICD-10-CM | POA: Diagnosis not present

## 2015-06-19 DIAGNOSIS — D631 Anemia in chronic kidney disease: Secondary | ICD-10-CM | POA: Diagnosis not present

## 2015-06-19 DIAGNOSIS — E1129 Type 2 diabetes mellitus with other diabetic kidney complication: Secondary | ICD-10-CM | POA: Diagnosis not present

## 2015-06-19 DIAGNOSIS — N2581 Secondary hyperparathyroidism of renal origin: Secondary | ICD-10-CM | POA: Diagnosis not present

## 2015-06-19 DIAGNOSIS — D509 Iron deficiency anemia, unspecified: Secondary | ICD-10-CM | POA: Diagnosis not present

## 2015-06-21 DIAGNOSIS — N2581 Secondary hyperparathyroidism of renal origin: Secondary | ICD-10-CM | POA: Diagnosis not present

## 2015-06-21 DIAGNOSIS — D509 Iron deficiency anemia, unspecified: Secondary | ICD-10-CM | POA: Diagnosis not present

## 2015-06-21 DIAGNOSIS — N186 End stage renal disease: Secondary | ICD-10-CM | POA: Diagnosis not present

## 2015-06-21 DIAGNOSIS — D631 Anemia in chronic kidney disease: Secondary | ICD-10-CM | POA: Diagnosis not present

## 2015-06-21 DIAGNOSIS — E1129 Type 2 diabetes mellitus with other diabetic kidney complication: Secondary | ICD-10-CM | POA: Diagnosis not present

## 2015-06-24 DIAGNOSIS — N2581 Secondary hyperparathyroidism of renal origin: Secondary | ICD-10-CM | POA: Diagnosis not present

## 2015-06-24 DIAGNOSIS — D631 Anemia in chronic kidney disease: Secondary | ICD-10-CM | POA: Diagnosis not present

## 2015-06-24 DIAGNOSIS — D509 Iron deficiency anemia, unspecified: Secondary | ICD-10-CM | POA: Diagnosis not present

## 2015-06-24 DIAGNOSIS — N186 End stage renal disease: Secondary | ICD-10-CM | POA: Diagnosis not present

## 2015-06-24 DIAGNOSIS — E1129 Type 2 diabetes mellitus with other diabetic kidney complication: Secondary | ICD-10-CM | POA: Diagnosis not present

## 2015-06-26 DIAGNOSIS — D631 Anemia in chronic kidney disease: Secondary | ICD-10-CM | POA: Diagnosis not present

## 2015-06-26 DIAGNOSIS — E1129 Type 2 diabetes mellitus with other diabetic kidney complication: Secondary | ICD-10-CM | POA: Diagnosis not present

## 2015-06-26 DIAGNOSIS — N2581 Secondary hyperparathyroidism of renal origin: Secondary | ICD-10-CM | POA: Diagnosis not present

## 2015-06-26 DIAGNOSIS — D509 Iron deficiency anemia, unspecified: Secondary | ICD-10-CM | POA: Diagnosis not present

## 2015-06-26 DIAGNOSIS — N186 End stage renal disease: Secondary | ICD-10-CM | POA: Diagnosis not present

## 2015-06-27 ENCOUNTER — Telehealth: Payer: Self-pay

## 2015-06-27 ENCOUNTER — Other Ambulatory Visit: Payer: Self-pay | Admitting: *Deleted

## 2015-06-27 NOTE — Telephone Encounter (Signed)
LMTCB regarding schedule [name] Left thigh AVG. Possible date would be 07-07-15 with Dr. Imogene Burn.

## 2015-06-27 NOTE — Telephone Encounter (Signed)
Received a message from "[place]" regarding Ms [name]. He stated that she was ready to schedule her surgical procedure. I see that Okey Regal had previously left a message for pt to call back and schedule. [name] can be reached at [phone].

## 2015-06-28 DIAGNOSIS — D509 Iron deficiency anemia, unspecified: Secondary | ICD-10-CM | POA: Diagnosis not present

## 2015-06-28 DIAGNOSIS — E1129 Type 2 diabetes mellitus with other diabetic kidney complication: Secondary | ICD-10-CM | POA: Diagnosis not present

## 2015-06-28 DIAGNOSIS — D631 Anemia in chronic kidney disease: Secondary | ICD-10-CM | POA: Diagnosis not present

## 2015-06-28 DIAGNOSIS — N2581 Secondary hyperparathyroidism of renal origin: Secondary | ICD-10-CM | POA: Diagnosis not present

## 2015-06-28 DIAGNOSIS — N186 End stage renal disease: Secondary | ICD-10-CM | POA: Diagnosis not present

## 2015-07-01 DIAGNOSIS — N2581 Secondary hyperparathyroidism of renal origin: Secondary | ICD-10-CM | POA: Diagnosis not present

## 2015-07-01 DIAGNOSIS — N186 End stage renal disease: Secondary | ICD-10-CM | POA: Diagnosis not present

## 2015-07-01 DIAGNOSIS — D509 Iron deficiency anemia, unspecified: Secondary | ICD-10-CM | POA: Diagnosis not present

## 2015-07-01 DIAGNOSIS — E1129 Type 2 diabetes mellitus with other diabetic kidney complication: Secondary | ICD-10-CM | POA: Diagnosis not present

## 2015-07-01 DIAGNOSIS — D631 Anemia in chronic kidney disease: Secondary | ICD-10-CM | POA: Diagnosis not present

## 2015-07-03 DIAGNOSIS — N2581 Secondary hyperparathyroidism of renal origin: Secondary | ICD-10-CM | POA: Diagnosis not present

## 2015-07-03 DIAGNOSIS — E1129 Type 2 diabetes mellitus with other diabetic kidney complication: Secondary | ICD-10-CM | POA: Diagnosis not present

## 2015-07-03 DIAGNOSIS — D631 Anemia in chronic kidney disease: Secondary | ICD-10-CM | POA: Diagnosis not present

## 2015-07-03 DIAGNOSIS — D509 Iron deficiency anemia, unspecified: Secondary | ICD-10-CM | POA: Diagnosis not present

## 2015-07-03 DIAGNOSIS — N186 End stage renal disease: Secondary | ICD-10-CM | POA: Diagnosis not present

## 2015-07-03 NOTE — Progress Notes (Signed)
Anesthesia Chart Review:  Pt is an 82 year old female scheduled for insertion of AV gore-tex L thigh graft on 07/07/2015 with Dr. Imogene Burn.   Pt is a same day work up.   PMH includes:  Stroke, DM, hyperparathyroidism, anemia, ESRD on HD, PAD, GERD. Former smoker. BMI 34.  Medications include: ASA, prilosec, albuterol  Pt will need labs DOS.   Chest x-ray 12/19/14 reviewed. No acute cardiopulmonary disease.   EKG 12/19/14: Sinus arrhythmia, PVCs. Inferior infarct, old. Anterior infarct, old. Prolonged QT interval. Baseline wander  Echo 12/15/13:  - Left ventricle: The cavity size was normal. There was moderate concentric hypertrophy. Systolic function was normal. The estimated ejection fraction was in the range of 55% to 60%. Wall motion was normal; there were no regional wall motion abnormalities. Doppler parameters are consistent with abnormal left ventricular relaxation (grade 1 diastolic dysfunction). The E/e&' ratio is between 8-15, suggesting indeterminate LV filling pressure. - Mitral valve: Calcified annulus. The anterior leaflet is calcified and less mobile. There was mild regurgitation. - Left atrium: Moderately dilated 42 ml/m2. - Right ventricle: The cavity size was normal. Wall thickness was normal. - Right atrium: The atrium was mildly dilated. - Atrial septum: There was a small patent foramen ovale by color doppler. - Tricuspid valve: There was mild regurgitation. - Pulmonary arteries: PA peak pressure: 32 mm Hg (S). - Impressions: Compared to the prior study in 2014, there are few changes. LVEFis 55-60%, moderate LAE and there is borderline increasedpulmoanry pressure. There also appears to be a small PFO by colordoppler.  Reviewed case with Dr. Aleene Davidson.   If no changes, I anticipate pt can proceed with surgery as scheduled.   Rica Mast, FNP-BC [facility]/Anesthesiology Phone: [phone] 07/03/2015 4:37 PM

## 2015-07-04 ENCOUNTER — Encounter (HOSPITAL_COMMUNITY): Payer: Self-pay | Admitting: *Deleted

## 2015-07-04 NOTE — Progress Notes (Signed)
Pt SDW-pre-op call was completed by pt niece [name] and great niece Johnnette Barrios. Both relatives deny pt C/O any acute cardiopulmonary issues. They deny pt having a stress test, cardiac cath and being under the care of a cardiologist. Relatives made aware to stop otc vitamins, fish oil , herbal medications and NSAID's ( all of which, they deny pt takes). Niece stated that pt is no longer considered diabetic, was taken off of diabetes medications and no longer checks BS, when attempting to provide instructions for diabetes protocol. Pt chart forwarded to anesthesia for review of cardiac history ( see note).

## 2015-07-04 NOTE — Progress Notes (Signed)
Both relatives of pt verbalized understanding of all pre-op instructions.

## 2015-07-05 DIAGNOSIS — N2581 Secondary hyperparathyroidism of renal origin: Secondary | ICD-10-CM | POA: Diagnosis not present

## 2015-07-05 DIAGNOSIS — D509 Iron deficiency anemia, unspecified: Secondary | ICD-10-CM | POA: Diagnosis not present

## 2015-07-05 DIAGNOSIS — N186 End stage renal disease: Secondary | ICD-10-CM | POA: Diagnosis not present

## 2015-07-05 DIAGNOSIS — D631 Anemia in chronic kidney disease: Secondary | ICD-10-CM | POA: Diagnosis not present

## 2015-07-05 DIAGNOSIS — E1129 Type 2 diabetes mellitus with other diabetic kidney complication: Secondary | ICD-10-CM | POA: Diagnosis not present

## 2015-07-06 MED ORDER — DEXTROSE 5 % IV SOLN
1.5000 g | INTRAVENOUS | Status: AC
  Administered 2015-07-07: 1.5 g via INTRAVENOUS
  Filled 2015-07-06: qty 1.5

## 2015-07-06 NOTE — Anesthesia Preprocedure Evaluation (Addendum)
Anesthesia Evaluation  Patient identified by MRN, date of birth, ID band Patient awake    Reviewed: Allergy & Precautions, NPO status , Patient's Chart, lab work & pertinent test results  Airway Mallampati: III  TM Distance: >3 FB Neck ROM: Full    Dental  (+) Upper Dentures, Lower Dentures   Pulmonary former smoker,    breath sounds clear to auscultation       Cardiovascular + Peripheral Vascular Disease   Rhythm:Regular Rate:Normal     Neuro/Psych Depression CVA    GI/Hepatic Neg liver ROS, GERD  ,  Endo/Other  diabetes  Renal/GU ESRF and CRFRenal disease     Musculoskeletal   Abdominal   Peds  Hematology  (+) anemia ,   Anesthesia Other Findings   Reproductive/Obstetrics                            Lab Results  Component Value Date   WBC 9.2 12/19/2014   HGB 13.9 07/07/2015   HCT 41.0 07/07/2015   MCV 79.6 12/19/2014   PLT 252 12/19/2014   Lab Results  Component Value Date   CREATININE 8.40* 12/19/2014   BUN 40* 12/19/2014   NA 137 07/07/2015   K 4.8 07/07/2015   CL 103 12/19/2014   CO2 26 12/19/2014    Anesthesia Physical Anesthesia Plan  ASA: III  Anesthesia Plan: General   Post-op Pain Management:    Induction: Intravenous  Airway Management Planned: LMA  Additional Equipment:   Intra-op Plan:   Post-operative Plan: Extubation in OR  Informed Consent:   Plan Discussed with:   Anesthesia Plan Comments:         Anesthesia Quick Evaluation

## 2015-07-07 ENCOUNTER — Ambulatory Visit (HOSPITAL_COMMUNITY): Payer: Medicare Other | Admitting: Emergency Medicine

## 2015-07-07 ENCOUNTER — Encounter (HOSPITAL_COMMUNITY)
Admission: RE | Disposition: A | Discharge status: Home / Self Care | Payer: Self-pay | Source: Ambulatory Visit | Attending: Vascular Surgery

## 2015-07-07 ENCOUNTER — Encounter (HOSPITAL_COMMUNITY): Payer: Self-pay | Admitting: Certified Registered Nurse Anesthetist

## 2015-07-07 ENCOUNTER — Ambulatory Visit (HOSPITAL_COMMUNITY)
Admission: RE | Admit: 2015-07-07 | Discharge: 2015-07-07 | Disposition: A | Discharge status: Home / Self Care | Payer: Medicare Other | Source: Ambulatory Visit | Attending: Vascular Surgery | Admitting: Vascular Surgery

## 2015-07-07 DIAGNOSIS — Z7982 Long term (current) use of aspirin: Secondary | ICD-10-CM | POA: Insufficient documentation

## 2015-07-07 DIAGNOSIS — E1122 Type 2 diabetes mellitus with diabetic chronic kidney disease: Secondary | ICD-10-CM | POA: Insufficient documentation

## 2015-07-07 DIAGNOSIS — Z79899 Other long term (current) drug therapy: Secondary | ICD-10-CM | POA: Insufficient documentation

## 2015-07-07 DIAGNOSIS — N186 End stage renal disease: Secondary | ICD-10-CM | POA: Diagnosis not present

## 2015-07-07 DIAGNOSIS — M1990 Unspecified osteoarthritis, unspecified site: Secondary | ICD-10-CM | POA: Insufficient documentation

## 2015-07-07 DIAGNOSIS — D649 Anemia, unspecified: Secondary | ICD-10-CM | POA: Diagnosis not present

## 2015-07-07 DIAGNOSIS — E1140 Type 2 diabetes mellitus with diabetic neuropathy, unspecified: Secondary | ICD-10-CM | POA: Diagnosis not present

## 2015-07-07 DIAGNOSIS — Z8614 Personal history of Methicillin resistant Staphylococcus aureus infection: Secondary | ICD-10-CM | POA: Insufficient documentation

## 2015-07-07 DIAGNOSIS — Z992 Dependence on renal dialysis: Secondary | ICD-10-CM | POA: Insufficient documentation

## 2015-07-07 DIAGNOSIS — Z87891 Personal history of nicotine dependence: Secondary | ICD-10-CM | POA: Insufficient documentation

## 2015-07-07 DIAGNOSIS — Z8673 Personal history of transient ischemic attack (TIA), and cerebral infarction without residual deficits: Secondary | ICD-10-CM | POA: Insufficient documentation

## 2015-07-07 DIAGNOSIS — E1151 Type 2 diabetes mellitus with diabetic peripheral angiopathy without gangrene: Secondary | ICD-10-CM | POA: Insufficient documentation

## 2015-07-07 DIAGNOSIS — K219 Gastro-esophageal reflux disease without esophagitis: Secondary | ICD-10-CM | POA: Diagnosis not present

## 2015-07-07 DIAGNOSIS — N189 Chronic kidney disease, unspecified: Secondary | ICD-10-CM | POA: Diagnosis not present

## 2015-07-07 DIAGNOSIS — E213 Hyperparathyroidism, unspecified: Secondary | ICD-10-CM | POA: Insufficient documentation

## 2015-07-07 HISTORY — PX: AV FISTULA PLACEMENT: SHX1204

## 2015-07-07 LAB — POCT I-STAT 4, (NA,K, GLUC, HGB,HCT)
Glucose, Bld: 105 mg/dL — ABNORMAL HIGH (ref 65–99)
HCT: 41 % (ref 36.0–46.0)
HEMOGLOBIN: 13.9 g/dL (ref 12.0–15.0)
Potassium: 4.8 mmol/L (ref 3.5–5.1)
Sodium: 137 mmol/L (ref 135–145)

## 2015-07-07 LAB — GLUCOSE, CAPILLARY
GLUCOSE-CAPILLARY: 93 mg/dL (ref 65–99)
Glucose-Capillary: 97 mg/dL (ref 65–99)

## 2015-07-07 SURGERY — INSERTION OF ARTERIOVENOUS (AV) GORE-TEX GRAFT THIGH
Anesthesia: General | Site: Leg Upper | Laterality: Left

## 2015-07-07 MED ORDER — FENTANYL CITRATE (PF) 250 MCG/5ML IJ SOLN
INTRAMUSCULAR | Status: AC
  Filled 2015-07-07: qty 5

## 2015-07-07 MED ORDER — OXYCODONE-ACETAMINOPHEN 5-325 MG PO TABS
1.0000 | ORAL_TABLET | Freq: Four times a day (QID) | ORAL | Status: DC | PRN
Start: 2015-07-07 — End: 2017-11-15

## 2015-07-07 MED ORDER — PROMETHAZINE HCL 25 MG/ML IJ SOLN: 6.2500 mg | INTRAMUSCULAR | Status: DC | PRN

## 2015-07-07 MED ORDER — PROPOFOL 10 MG/ML IV BOLUS
INTRAVENOUS | Status: AC
  Filled 2015-07-07: qty 20

## 2015-07-07 MED ORDER — STERILE WATER FOR INJECTION IJ SOLN
INTRAMUSCULAR | Status: AC
  Filled 2015-07-07: qty 10

## 2015-07-07 MED ORDER — ROCURONIUM BROMIDE 50 MG/5ML IV SOLN
INTRAVENOUS | Status: AC
  Filled 2015-07-07: qty 1

## 2015-07-07 MED ORDER — PHENYLEPHRINE HCL 10 MG/ML IJ SOLN
INTRAMUSCULAR | Status: DC | PRN
  Administered 2015-07-07 (×3): 80 ug via INTRAVENOUS

## 2015-07-07 MED ORDER — LIDOCAINE HCL (CARDIAC) 20 MG/ML IV SOLN
INTRAVENOUS | Status: AC
  Filled 2015-07-07: qty 5

## 2015-07-07 MED ORDER — LIDOCAINE HCL (CARDIAC) 20 MG/ML IV SOLN
INTRAVENOUS | Status: DC | PRN
  Administered 2015-07-07: 60 mg via INTRAVENOUS

## 2015-07-07 MED ORDER — 0.9 % SODIUM CHLORIDE (POUR BTL) OPTIME
TOPICAL | Status: DC | PRN
  Administered 2015-07-07: 1000 mL

## 2015-07-07 MED ORDER — EPHEDRINE SULFATE 50 MG/ML IJ SOLN
INTRAMUSCULAR | Status: AC
  Filled 2015-07-07: qty 1

## 2015-07-07 MED ORDER — SUCCINYLCHOLINE CHLORIDE 20 MG/ML IJ SOLN
INTRAMUSCULAR | Status: DC | PRN
  Administered 2015-07-07: 100 mg via INTRAVENOUS

## 2015-07-07 MED ORDER — SUCCINYLCHOLINE CHLORIDE 20 MG/ML IJ SOLN
INTRAMUSCULAR | Status: AC
  Filled 2015-07-07: qty 1

## 2015-07-07 MED ORDER — FENTANYL CITRATE (PF) 250 MCG/5ML IJ SOLN
INTRAMUSCULAR | Status: DC | PRN
  Administered 2015-07-07: 25 ug via INTRAVENOUS
  Administered 2015-07-07: 50 ug via INTRAVENOUS
  Administered 2015-07-07: 25 ug via INTRAVENOUS

## 2015-07-07 MED ORDER — PROPOFOL 10 MG/ML IV BOLUS
INTRAVENOUS | Status: DC | PRN
  Administered 2015-07-07 (×2): 100 mg via INTRAVENOUS

## 2015-07-07 MED ORDER — LIDOCAINE HCL (PF) 1 % IJ SOLN
INTRAMUSCULAR | Status: AC
  Filled 2015-07-07: qty 30

## 2015-07-07 MED ORDER — SODIUM CHLORIDE 0.9 % IV SOLN
INTRAVENOUS | Status: DC | PRN
  Administered 2015-07-07: 500 mL

## 2015-07-07 MED ORDER — PHENYLEPHRINE HCL 10 MG/ML IJ SOLN
10.0000 mg | INTRAVENOUS | Status: DC | PRN
  Administered 2015-07-07: 20 ug/min via INTRAVENOUS

## 2015-07-07 MED ORDER — CHLORHEXIDINE GLUCONATE 4 % EX LIQD: 1.0000 "application " | Freq: Once | CUTANEOUS | Status: DC

## 2015-07-07 MED ORDER — ONDANSETRON HCL 4 MG/2ML IJ SOLN
INTRAMUSCULAR | Status: DC | PRN
  Administered 2015-07-07: 4 mg via INTRAVENOUS

## 2015-07-07 MED ORDER — FENTANYL CITRATE (PF) 100 MCG/2ML IJ SOLN: 25.0000 ug | INTRAMUSCULAR | Status: DC | PRN

## 2015-07-07 MED ORDER — SODIUM CHLORIDE 0.9 % IV SOLN
INTRAVENOUS | Status: DC
Start: 2015-07-07 — End: 2015-07-07
  Administered 2015-07-07: 07:00:00 via INTRAVENOUS

## 2015-07-07 SURGICAL SUPPLY — 27 items
AGENT HMST SPONGE THK3/8 (HEMOSTASIS)
CANISTER SUCTION 2500CC (MISCELLANEOUS) ×3 IMPLANT
CLIP TI MEDIUM 6 (CLIP) ×3 IMPLANT
CLIP TI WIDE RED SMALL 6 (CLIP) ×3 IMPLANT
COVER PROBE W GEL 5X96 (DRAPES) ×2 IMPLANT
DRAPE INCISE IOBAN 66X45 STRL (DRAPES) ×6 IMPLANT
ELECT REM PT RETURN 9FT ADLT (ELECTROSURGICAL) ×3
ELECTRODE REM PT RTRN 9FT ADLT (ELECTROSURGICAL) ×1 IMPLANT
GLOVE BIO SURGEON STRL SZ7 (GLOVE) ×3 IMPLANT
GLOVE BIOGEL PI IND STRL 7.5 (GLOVE) ×1 IMPLANT
GLOVE BIOGEL PI INDICATOR 7.5 (GLOVE) ×2
GOWN STRL REUS W/ TWL LRG LVL3 (GOWN DISPOSABLE) ×3 IMPLANT
GOWN STRL REUS W/TWL LRG LVL3 (GOWN DISPOSABLE) ×9
HEMOSTAT SPONGE AVITENE ULTRA (HEMOSTASIS) IMPLANT
KIT BASIN OR (CUSTOM PROCEDURE TRAY) ×3 IMPLANT
KIT ROOM TURNOVER OR (KITS) ×3 IMPLANT
LIQUID BAND (GAUZE/BANDAGES/DRESSINGS) ×3 IMPLANT
NS IRRIG 1000ML POUR BTL (IV SOLUTION) ×3 IMPLANT
PACK CV ACCESS (CUSTOM PROCEDURE TRAY) ×3 IMPLANT
PAD ARMBOARD 7.5X6 YLW CONV (MISCELLANEOUS) ×6 IMPLANT
SUT GORETEX 5 0 TT13 24 (SUTURE) ×6 IMPLANT
SUT MNCRL AB 4-0 PS2 18 (SUTURE) ×5 IMPLANT
SUT VIC AB 2-0 CT1 27 (SUTURE) ×6
SUT VIC AB 2-0 CT1 TAPERPNT 27 (SUTURE) ×1 IMPLANT
SUT VIC AB 3-0 SH 27 (SUTURE) ×6
SUT VIC AB 3-0 SH 27X BRD (SUTURE) ×2 IMPLANT
WATER STERILE IRR 1000ML POUR (IV SOLUTION) ×3 IMPLANT

## 2015-07-07 NOTE — Progress Notes (Signed)
Spoke to pt's son, La Shehan, instructed someone needs to stay with pt. 24 hrs. After surgery. He stated he was available to stay with her.

## 2015-07-07 NOTE — Anesthesia Postprocedure Evaluation (Signed)
Anesthesia Post Note  Patient: [name]  Procedure(s) Performed: Procedure(s) (LRB): ATTEMPTED INSERTION OFLEFT  ARTERIOVENOUS (AV) GORE-TEX GRAFT THIGH (Left)  Patient location during evaluation: PACU Anesthesia Type: General Level of consciousness: awake and alert Pain management: pain level controlled Vital Signs Assessment: post-procedure vital signs reviewed and stable Respiratory status: spontaneous breathing Cardiovascular status: blood pressure returned to baseline Anesthetic complications: no    Last Vitals:  Filed Vitals:   07/07/15 0945 07/07/15 1003  BP: 113/55 129/66  Pulse: 63 63  Temp:    Resp: 11     Last Pain: There were no vitals filed for this visit.               Kennieth Rad

## 2015-07-07 NOTE — H&P (Signed)
Brief History and Physical  History of Present Illness  [name] is a 82 y.o. female who presents with chief complaint: ESRD.  The patient presents today for L thigh AVG placement.    Past Medical History  Diagnosis Date  . Diabetes mellitus   . Anemia   . Hyperparathyroidism   . Depression   . Obesity   . Osteoarthritis   . MRSA (methicillin resistant staph aureus) culture positive   . Gout   . C. difficile diarrhea   . Chronic kidney disease     on Dialysis  . Diabetic neuropathy (HCC)   . GERD (gastroesophageal reflux disease)   . Stroke (HCC)   . Peripheral arterial disease (HCC)     Gangrene-Left Great Toe  . Shortness of breath dyspnea     with exertion    Past Surgical History  Procedure Laterality Date  . Breast biopsy Right   . Shuntogram Right 09/06/12    Thigh graft  . Sp declot avgg Right Sep 14, 2012    Right thigh graft  . Colonoscopy    . Multiple tooth extractions    . Abdominal hysterectomy      Social History   Social History  . Marital Status: Divorced    Spouse Name: N/A  . Number of Children: 0  . Years of Education: college   Occupational History  . retired    Social History Main Topics  . Smoking status: Former Smoker    Types: Cigarettes  . Smokeless tobacco: Never Used  . Alcohol Use: No  . Drug Use: No  . Sexual Activity: No   Other Topics Concern  . Not on file   Social History Narrative    Family History  Problem Relation Age of Onset  . Cancer Father     type unknown  . Diabetes Sister   . Cancer Brother     type unknown  . Stroke Mother     No current facility-administered medications on file prior to encounter.   Current Outpatient Prescriptions on File Prior to Encounter  Medication Sig Dispense Refill  . acetaminophen (TYLENOL) 500 MG tablet Take 500-1,000 mg by mouth every 6 (six) hours as needed for mild pain.     Marland Kitchen aspirin 325 MG EC tablet Take 325 mg by mouth daily.    Marland Kitchen RENVELA 800 MG  tablet Take 1,600-2,400 mg by mouth 3 (three) times daily with meals.  with meals,  with snacks    . SENSIPAR 30 MG tablet Take 30 mg by mouth every evening.     . gabapentin (NEURONTIN) 100 MG capsule Take 100 mg by mouth 2 (two) times daily.       Allergies  Allergen Reactions  . Codeine Hives  . Vancomycin Rash    Review of Systems: As listed above, otherwise negative.  Physical Examination  Filed Vitals:   07/07/15 0636  BP: 154/59  Pulse: 75  Temp: 97.9 F (36.6 C)  TempSrc: Oral  Resp: 16  Height:  (1.575 m)  SpO2: 99%    General: A&O x 3, WDWN  Pulmonary: Sym exp, good air movt, CTAB, no rales, rhonchi, & wheezing  Cardiac: RRR, Nl S1, S2, no Murmurs, rubs or gallops  Gastrointestinal: soft, NTND, -G/R, - HSM, - masses, - CVAT B  Musculoskeletal: M/S 5/5 throughout , Extremities without ischemic changes   Laboratory See iStat  Medical Decision Making  [name] is a 82 y.o. female  who presents with: ESRD.   The patient is scheduled for: L thigh AVG  Risk, benefits, and alternatives to access surgery were discussed.  The patient is aware the risks include but are not limited to: bleeding, infection, steal syndrome, nerve damage, ischemic monomelic neuropathy, failure to mature, and need for additional procedures.  Waveforms in LLE are consistent with moderate PAD.  I have discussed with this patient on two occasions to date the dangers of thigh AVG in PAD patients.  She is aware is at risk for development of gangrene in the left foot and needing an amputation.  The patient is aware of the risks and agrees to proceed.   Leonides Sake, MD [facility] [place] Office: [phone] Pager: [phone]  07/07/2015, 7:02 AM

## 2015-07-07 NOTE — Op Note (Signed)
    OPERATIVE NOTE   PROCEDURE: 1. Left groin exploration  PRE-OPERATIVE DIAGNOSIS: end stage renal disease   POST-OPERATIVE DIAGNOSIS: same as above   SURGEON: Leonides Sake, MD  ASSISTANT(S): Lianne Cure, PAC   ANESTHESIA: general  ESTIMATED BLOOD LOSS: 50 cc  FINDING(S): 1.  Pre-existing skin tear in left groin/pannus 2.  Non-compressive calcified common femoral artery   SPECIMEN(S):  none  INDICATIONS:   [name] is a 82 y.o. female who presents with end stage renal disease with only remaining permanent access left thigh arteriovenous graft.  Risk, benefits, and alternatives to access surgery were discussed.  The patient is aware the risks include but are not limited to: bleeding, infection, steal syndrome, nerve damage, ischemic monomelic neuropathy, failure to mature, need for additional procedures, death and stroke.  I reiterated to this patient that with moderate peripheral arterial disease in the left leg, she was at risk for gangrene with placement of a left thigh arteriovenous graft.  The patient agrees to proceed forward with the procedure.    DESCRIPTION: After obtaining full informed written consent, the patient was brought back to the operating room and placed supine upon the operating table.  The patient received IV antibiotics prior to induction.  After obtaining adequate anesthesia, the patient was prepped and draped in the standard fashion for: left thigh arteriovenous graft.  I identified the left common femoral artery under sonosite guidance.  I made an oblique incision one finger width above the left inguinal crease, centered on the left common femoral artery.  I dissected through the subcutaneous tissue with electrocautery until I had exposure of the common femoral artery.  Immediately, it became evident that this common femoral artery was heavily calcified.  I reset my exposure and then dissected out the common femoral artery further from the femoral  bifurcation up to the inguinal ligament.  I tried to compress this common femoral artery with a vascular clamp but it could not be clamped either proximally or distally.  This patient would require an iliofemoral endarterectomy with bovine patch angioplasty to enable a thigh arteriovenous graft.  This patient is not consented for such this, so I aborted the procedure.   Additionally, this intraoperative findings makes this patient very high risk for steal complication as her inflow is extremely diseased.  The subcutaneous tissue was reapproximated with a deep double layer of 2-0 Vicryl and then a double layer of 3-0 Vicryl in the superficial subcutaneous tissue.  The skin was reapproximated with a running subcuticular stitch of 4-0 Monocryl.  The skin was cleaned, dried, and reinforced with dermabond.  A coverderm was also applied to over the prior skin tear in the pannus.   COMPLICATIONS: none  CONDITION: stable   Leonides Sake, MD [facility] [place] Office: [phone] Pager: [phone]  07/07/2015, 8:27 AM

## 2015-07-07 NOTE — Transfer of Care (Signed)
Immediate Anesthesia Transfer of Care Note  Patient: [name]  Procedure(s) Performed: Procedure(s): ATTEMPTED INSERTION OFLEFT  ARTERIOVENOUS (AV) GORE-TEX GRAFT THIGH (Left)  Patient Location: PACU  Anesthesia Type:General  Level of Consciousness: awake and alert   Airway & Oxygen Therapy: Patient Spontanous Breathing and Patient connected to nasal cannula oxygen  Post-op Assessment: Report given to RN and Post -op Vital signs reviewed and stable  Post vital signs: Reviewed and stable  Last Vitals:  Filed Vitals:   07/07/15 0636 07/07/15 0843  BP: 154/59 122/52  Pulse: 75 60  Temp: 36.6 C 36.3 C  Resp: 16 12    Complications: No apparent anesthesia complications

## 2015-07-07 NOTE — Progress Notes (Signed)
Pt with HD cath present on arrival to pacu, both ports capped

## 2015-07-07 NOTE — Anesthesia Procedure Notes (Signed)
Procedure Name: Intubation Date/Time: 07/07/2015 7:37 AM Performed by: Reine Just Pre-anesthesia Checklist: Patient identified, Timeout performed, Emergency Drugs available, Suction available and Patient being monitored Patient Re-evaluated:Patient Re-evaluated prior to inductionOxygen Delivery Method: Circle system utilized and Simple face mask Preoxygenation: Pre-oxygenation with 100% oxygen Intubation Type: IV induction Ventilation: Mask ventilation without difficulty Laryngoscope Size: Mac and 4 Grade View: Grade I Tube type: Oral Tube size: 7.5 mm Number of attempts: 1 Airway Equipment and Method: Patient positioned with wedge pillow and Stylet Placement Confirmation: ETT inserted through vocal cords under direct vision,  positive ETCO2 and breath sounds checked- equal and bilateral Secured at: 22 cm Tube secured with: Tape Dental Injury: Teeth and Oropharynx as per pre-operative assessment  Comments: Attempt to place #4 LMA unsuccessful. 7.5v OETT placed as above. Pt remained stable

## 2015-07-08 ENCOUNTER — Emergency Department (HOSPITAL_COMMUNITY)
Admission: EM | Admit: 2015-07-08 | Discharge: 2015-07-08 | Disposition: A | Discharge status: Home / Self Care | Payer: Medicare Other | Attending: Emergency Medicine | Admitting: Emergency Medicine

## 2015-07-08 ENCOUNTER — Encounter (HOSPITAL_COMMUNITY): Payer: Self-pay | Admitting: Cardiology

## 2015-07-08 ENCOUNTER — Emergency Department (HOSPITAL_COMMUNITY): Payer: Medicare Other

## 2015-07-08 ENCOUNTER — Telehealth: Payer: Self-pay | Admitting: Vascular Surgery

## 2015-07-08 DIAGNOSIS — E119 Type 2 diabetes mellitus without complications: Secondary | ICD-10-CM | POA: Insufficient documentation

## 2015-07-08 DIAGNOSIS — Z79899 Other long term (current) drug therapy: Secondary | ICD-10-CM | POA: Diagnosis not present

## 2015-07-08 DIAGNOSIS — F329 Major depressive disorder, single episode, unspecified: Secondary | ICD-10-CM | POA: Diagnosis not present

## 2015-07-08 DIAGNOSIS — M1990 Unspecified osteoarthritis, unspecified site: Secondary | ICD-10-CM | POA: Insufficient documentation

## 2015-07-08 DIAGNOSIS — E213 Hyperparathyroidism, unspecified: Secondary | ICD-10-CM | POA: Diagnosis not present

## 2015-07-08 DIAGNOSIS — Z8719 Personal history of other diseases of the digestive system: Secondary | ICD-10-CM | POA: Insufficient documentation

## 2015-07-08 DIAGNOSIS — N2581 Secondary hyperparathyroidism of renal origin: Secondary | ICD-10-CM | POA: Diagnosis not present

## 2015-07-08 DIAGNOSIS — Z7982 Long term (current) use of aspirin: Secondary | ICD-10-CM | POA: Insufficient documentation

## 2015-07-08 DIAGNOSIS — Z87891 Personal history of nicotine dependence: Secondary | ICD-10-CM | POA: Insufficient documentation

## 2015-07-08 DIAGNOSIS — Z8614 Personal history of Methicillin resistant Staphylococcus aureus infection: Secondary | ICD-10-CM | POA: Diagnosis not present

## 2015-07-08 DIAGNOSIS — Z992 Dependence on renal dialysis: Secondary | ICD-10-CM | POA: Diagnosis not present

## 2015-07-08 DIAGNOSIS — E1129 Type 2 diabetes mellitus with other diabetic kidney complication: Secondary | ICD-10-CM | POA: Diagnosis not present

## 2015-07-08 DIAGNOSIS — N186 End stage renal disease: Secondary | ICD-10-CM | POA: Diagnosis not present

## 2015-07-08 DIAGNOSIS — Z8673 Personal history of transient ischemic attack (TIA), and cerebral infarction without residual deficits: Secondary | ICD-10-CM | POA: Insufficient documentation

## 2015-07-08 DIAGNOSIS — E669 Obesity, unspecified: Secondary | ICD-10-CM | POA: Insufficient documentation

## 2015-07-08 DIAGNOSIS — D509 Iron deficiency anemia, unspecified: Secondary | ICD-10-CM | POA: Diagnosis not present

## 2015-07-08 DIAGNOSIS — R51 Headache: Secondary | ICD-10-CM | POA: Diagnosis not present

## 2015-07-08 DIAGNOSIS — D649 Anemia, unspecified: Secondary | ICD-10-CM | POA: Insufficient documentation

## 2015-07-08 DIAGNOSIS — R930 Abnormal findings on diagnostic imaging of skull and head, not elsewhere classified: Secondary | ICD-10-CM | POA: Diagnosis not present

## 2015-07-08 DIAGNOSIS — R531 Weakness: Secondary | ICD-10-CM | POA: Diagnosis not present

## 2015-07-08 DIAGNOSIS — R519 Headache, unspecified: Secondary | ICD-10-CM

## 2015-07-08 DIAGNOSIS — D631 Anemia in chronic kidney disease: Secondary | ICD-10-CM | POA: Diagnosis not present

## 2015-07-08 LAB — COMPREHENSIVE METABOLIC PANEL
ALT: 15 U/L (ref 14–54)
AST: 22 U/L (ref 15–41)
Albumin: 3.4 g/dL — ABNORMAL LOW (ref 3.5–5.0)
Alkaline Phosphatase: 89 U/L (ref 38–126)
Anion gap: 14 (ref 5–15)
BUN: 18 mg/dL (ref 6–20)
CHLORIDE: 98 mmol/L — AB (ref 101–111)
CO2: 30 mmol/L (ref 22–32)
Calcium: 9 mg/dL (ref 8.9–10.3)
Creatinine, Ser: 5.73 mg/dL — ABNORMAL HIGH (ref 0.44–1.00)
GFR calc Af Amer: 7 mL/min — ABNORMAL LOW (ref >60)
GFR, EST NON AFRICAN AMERICAN: 6 mL/min — AB (ref >60)
Glucose, Bld: 121 mg/dL — ABNORMAL HIGH (ref 65–99)
POTASSIUM: 3.9 mmol/L (ref 3.5–5.1)
SODIUM: 142 mmol/L (ref 135–145)
Total Bilirubin: 0.5 mg/dL (ref 0.3–1.2)
Total Protein: 7.7 g/dL (ref 6.5–8.1)

## 2015-07-08 LAB — DIFFERENTIAL
BASOS PCT: 0 %
Basophils Absolute: 0 10*3/uL (ref 0.0–0.1)
EOS ABS: 0.2 10*3/uL (ref 0.0–0.7)
Eosinophils Relative: 2 %
LYMPHS ABS: 0.8 10*3/uL (ref 0.7–4.0)
LYMPHS PCT: 9 %
MONO ABS: 0.6 10*3/uL (ref 0.1–1.0)
MONOS PCT: 7 %
Neutro Abs: 6.5 10*3/uL (ref 1.7–7.7)
Neutrophils Relative %: 81 %

## 2015-07-08 LAB — I-STAT CHEM 8, ED
BUN: 21 mg/dL — ABNORMAL HIGH (ref 6–20)
Calcium, Ion: 1.02 mmol/L — ABNORMAL LOW (ref 1.13–1.30)
Chloride: 97 mmol/L — ABNORMAL LOW (ref 101–111)
Creatinine, Ser: 5.7 mg/dL — ABNORMAL HIGH (ref 0.44–1.00)
Glucose, Bld: 114 mg/dL — ABNORMAL HIGH (ref 65–99)
HEMATOCRIT: 47 % — AB (ref 36.0–46.0)
HEMOGLOBIN: 16 g/dL — AB (ref 12.0–15.0)
POTASSIUM: 3.8 mmol/L (ref 3.5–5.1)
SODIUM: 140 mmol/L (ref 135–145)
TCO2: 30 mmol/L (ref 0–100)

## 2015-07-08 LAB — I-STAT TROPONIN, ED: TROPONIN I, POC: 0.01 ng/mL (ref 0.00–0.08)

## 2015-07-08 LAB — CBC
HEMATOCRIT: 42.5 % (ref 36.0–46.0)
HEMOGLOBIN: 13 g/dL (ref 12.0–15.0)
MCH: 24.4 pg — AB (ref 26.0–34.0)
MCHC: 30.6 g/dL (ref 30.0–36.0)
MCV: 79.7 fL (ref 78.0–100.0)
Platelets: 188 10*3/uL (ref 150–400)
RBC: 5.33 MIL/uL — AB (ref 3.87–5.11)
RDW: 18.7 % — ABNORMAL HIGH (ref 11.5–15.5)
WBC: 8.1 10*3/uL (ref 4.0–10.5)

## 2015-07-08 LAB — PROTIME-INR
INR: 1.02 (ref 0.00–1.49)
PROTHROMBIN TIME: 13.6 s (ref 11.6–15.2)

## 2015-07-08 LAB — APTT: aPTT: 38 seconds — ABNORMAL HIGH (ref 24–37)

## 2015-07-08 LAB — CBG MONITORING, ED: Glucose-Capillary: 106 mg/dL — ABNORMAL HIGH (ref 65–99)

## 2015-07-08 MED ORDER — ACETAMINOPHEN 500 MG PO TABS
1000.0000 mg | ORAL_TABLET | Freq: Once | ORAL | Status: AC
  Administered 2015-07-08: 1000 mg via ORAL
  Filled 2015-07-08: qty 2

## 2015-07-08 NOTE — ED Notes (Signed)
Pt is in stable condition upon d/c and ambulates from ED. 

## 2015-07-08 NOTE — ED Notes (Signed)
CBG 106 

## 2015-07-08 NOTE — Discharge Instructions (Signed)

## 2015-07-08 NOTE — ED Notes (Signed)
RN out to assist pt out of car. Pt reports just finished dialysis at noon, c/o "hottness" to right cheek that started recently. H/a for several days. Pt was a x 4. Was accompanied by family. Speech was clear, no facial droop noted. Reports left side weakness for previous CVA.

## 2015-07-08 NOTE — Telephone Encounter (Signed)
LM for pt to call regarding appt date/time. dpm

## 2015-07-08 NOTE — ED Notes (Signed)
Patient transported to CT 

## 2015-07-08 NOTE — Telephone Encounter (Signed)
-----   Message from Sharee Pimple, RN sent at 07/07/2015  9:37 AM EST ----- Regarding: schedule   ----- Message -----    From: Fransisco Hertz, MD    Sent: 07/07/2015   8:39 AM      To: [address]  [name] [id number] 22-Aug-1932  PROCEDURE: 1.   Left groin exploration  Asst: Lianne Cure, PAC   Follow-up: 2 weeks

## 2015-07-08 NOTE — ED Provider Notes (Signed)
CSN: [id number]     Arrival date & time 07/08/15  1207 History   First MD Initiated Contact with Patient 07/08/15 1306     Chief Complaint  Patient presents with  . Headache     (Consider location/radiation/quality/duration/timing/severity/associated sxs/prior Treatment) HPI 82 year old female who presents with headache. History of DM, anemia, ESRD on HD, and prior CVA with left sided residual weakness. States 2 weeks of intermittent headaches. States migraines when younger, but no recent history of headaches until 2 weeks ago. Headaches "comes and goes", responds to tylenol. Typically located over left side without photophobia, nausea, vomiting, neck pain, recent URI or sinus infection, fevers, cough. No new weakness, focal numbness, confusion, vision or speech changes. HA not worsened at specific time of day and not associated with position changes. Currently 1/10 in severity, but states sometimes more severe than is currently. During dialysis today, felt warm over her right face. No facial numbness or facial droop.  Past Medical History  Diagnosis Date  . Diabetes mellitus   . Anemia   . Hyperparathyroidism   . Depression   . Obesity   . Osteoarthritis   . MRSA (methicillin resistant staph aureus) culture positive   . Gout   . C. difficile diarrhea   . Chronic kidney disease     on Dialysis  . Diabetic neuropathy (HCC)   . GERD (gastroesophageal reflux disease)   . Stroke (HCC)   . Peripheral arterial disease (HCC)     Gangrene-Left Great Toe  . Shortness of breath dyspnea     with exertion   Past Surgical History  Procedure Laterality Date  . Breast biopsy Right   . Shuntogram Right 09/06/12    Thigh graft  . Sp declot avgg Right Sep 14, 2012    Right thigh graft  . Colonoscopy    . Multiple tooth extractions    . Abdominal hysterectomy    . Av fistula placement Left 07/07/2015    Procedure: ATTEMPTED INSERTION OFLEFT  ARTERIOVENOUS (AV) GORE-TEX GRAFT THIGH;  Surgeon:  Fransisco Hertz, MD;  Location: [facility] OR;  Service: Vascular;  Laterality: Left;   Family History  Problem Relation Age of Onset  . Cancer Father     type unknown  . Diabetes Sister   . Cancer Brother     type unknown  . Stroke Mother    Social History  Substance Use Topics  . Smoking status: Former Smoker    Types: Cigarettes  . Smokeless tobacco: Never Used  . Alcohol Use: No   OB History    No data available     Review of Systems 10/14 systems reviewed and are negative other than those stated in the HPI   Allergies  Codeine and Vancomycin  Home Medications   Prior to Admission medications   Medication Sig Start Date End Date Taking? Authorizing Provider  acetaminophen (TYLENOL) 500 MG tablet Take 500-1,000 mg by mouth every 6 (six) hours as needed for mild pain.    Yes Historical Provider, MD  aspirin 325 MG EC tablet Take 325 mg by mouth daily.   Yes Historical Provider, MD  Doxercalciferol (HECTOROL) 2 MCG/ML SOLN Inject 3 mcg into the vein 3 (three) times a week.   Yes Historical Provider, MD  gabapentin (NEURONTIN) 100 MG capsule Take 100 mg by mouth 2 (two) times daily.  10/10/13  Yes Historical Provider, MD  IRON SUCROSE IV Inject 1 Dose into the vein once a week.   Yes Historical  Provider, MD  Methoxy PEG-Epoetin Beta (MIRCERA) 75 MCG/0.3ML SOSY Inject 75 mcg as directed every 14 (fourteen) days.   Yes Historical Provider, MD  oxyCODONE-acetaminophen (PERCOCET/ROXICET) 5-325 MG tablet Take 1 tablet by mouth every 6 (six) hours as needed. 07/07/15  Yes Lars Mage, PA-C  RENVELA 800 MG tablet Take 1,600-2,400 mg by mouth 3 (three) times daily with meals. 2400mg  with meals, 1600mg  with snacks 11/20/14  Yes Historical Provider, MD  SENSIPAR 30 MG tablet Take 30 mg by mouth every evening.  04/13/13  Yes Historical Provider, MD   BP 146/78 mmHg  Pulse 78  Temp(Src) 98.1 F (36.7 C) (Oral)  Resp 20  SpO2 97% Physical Exam Physical Exam  Nursing note and vitals  reviewed. Constitutional: Well developed, well nourished, non-toxic, and in no acute distress Head: Normocephalic and atraumatic. No tenderness with palpation over temples bilaterally. Mouth/Throat: Oropharynx is clear and moist.  Neck: Normal range of motion. Neck supple.  Cardiovascular: Normal rate and regular rhythm.   Pulmonary/Chest: Effort normal and breath sounds normal.  Abdominal: Soft. There is no tenderness. There is no rebound and no guarding.  Musculoskeletal: Normal range of motion.  Skin: Skin is warm and dry.  Psychiatric: Cooperative Neurological:  Alert, oriented to person, place, time, and situation. Memory grossly in tact. Fluent speech. No dysarthria or aphasia.  Cranial nerves: VF are full.  Pupils are symmetric, and reactive to light. EOMI without nystagmus. No gaze deviation. Facial muscles symmetric with activation. Sensation to light touch over face in tact bilaterally. Hearing grossly in tact. Palate elevates symmetrically. Head turn and shoulder shrug are intact. Tongue midline.  Reflexes defered.  Muscle bulk and tone normal. No pronator drift. Moves all extremities symmetrically. No drift with lower extremities against gravity, but requires more effort on LLE vs RLE.  Sensation to light touch is in tact throughout in bilateral upper and lower extremities. Coordination reveals no dysmetria with finger to nose. Gait is narrow-based and steady.   ED Course  Procedures (including critical care time) Labs Review Labs Reviewed  APTT - Abnormal; Notable for the following:    aPTT 38 (*)    All other components within normal limits  CBC - Abnormal; Notable for the following:    RBC 5.33 (*)    MCH 24.4 (*)    RDW 18.7 (*)    All other components within normal limits  COMPREHENSIVE METABOLIC PANEL - Abnormal; Notable for the following:    Chloride 98 (*)    Glucose, Bld 121 (*)    Creatinine, Ser 5.73 (*)    Albumin 3.4 (*)    GFR calc non Af Amer 6 (*)     GFR calc Af Amer 7 (*)    All other components within normal limits  CBG MONITORING, ED - Abnormal; Notable for the following:    Glucose-Capillary 106 (*)    All other components within normal limits  I-STAT CHEM 8, ED - Abnormal; Notable for the following:    Chloride 97 (*)    BUN 21 (*)    Creatinine, Ser 5.70 (*)    Glucose, Bld 114 (*)    Calcium, Ion 1.02 (*)    Hemoglobin 16.0 (*)    HCT 47.0 (*)    All other components within normal limits  PROTIME-INR  DIFFERENTIAL  I-STAT TROPOININ, ED    Imaging Review Ct Head Wo Contrast  07/08/2015  CLINICAL DATA:  Patient reports stroke-like symptoms following hemodialysis. EXAM: CT HEAD WITHOUT CONTRAST  TECHNIQUE: Contiguous axial images were obtained from the base of the skull through the vertex without intravenous contrast. COMPARISON:  12/14/2013 FINDINGS: There is low attenuation throughout the subcortical and periventricular white matter compatible with chronic microvascular disease. No abnormal extra-axial fluid collection, intracranial hemorrhage or mass identified. No evidence for acute cortical infarct. The paranasal sinuses and mastoid air cells are clear. The calvarium is intact. IMPRESSION: 1. Small vessel ischemic disease and brain atrophy. 2. No acute intracranial abnormality identified. Electronically Signed   By: Signa Kell M.D.   On: 07/08/2015 14:16   I have personally reviewed and evaluated these images and lab results as part of my medical decision-making.   EKG Interpretation   Date/Time:  Tuesday July 08 2015 12:21:28 EST Ventricular Rate:  92 PR Interval:  138 QRS Duration: 72 QT Interval:  396 QTC Calculation: 489 R Axis:   158 Text Interpretation:  Poor data quality, interpretation may be  adversely affected Sinus rhythm with Premature atrial complexes Low  voltage QRS Left posterior fascicular block Nonspecific ST and T wave  abnormality Abnormal ECG Confirmed by Lori Popowski MD, Annabelle Harman ([id number]) on 07/08/2015   1:21:22 PM      MDM   Final diagnoses:  Nonintractable episodic headache, unspecified headache type   82 year old female with history of ESRD on HD and CVA and left sided weakness who presents with intermittent headaches for 2 weeks. VS are non-concerning. Neuro exam in tact and without deficits c/f CVA. Ct head negative. HA responds to tylenol. No pain over temporal arteries, jaw claudication, vision changes and no concern for temporal arteritis. No recent infections, fever, meningismus or infectious symptoms suggestive of intracranial infection. Pain not of sudden onset maximal intensity and no consistent with SAH. No major thrombotic risk factors for dural thrombosis. At this time, no concern for serious etiology of headache. Discussed continued supportive management and close follow-up with PCP regarding further work-up and further work-up as needed. Strict return and follow-up instructions reviewed. She expressed understanding of all discharge instructions and felt comfortable with the plan of care.     Lavera Guise, MD 07/08/15 [id number]

## 2015-07-10 DIAGNOSIS — N2581 Secondary hyperparathyroidism of renal origin: Secondary | ICD-10-CM | POA: Diagnosis not present

## 2015-07-10 DIAGNOSIS — D631 Anemia in chronic kidney disease: Secondary | ICD-10-CM | POA: Diagnosis not present

## 2015-07-10 DIAGNOSIS — E1129 Type 2 diabetes mellitus with other diabetic kidney complication: Secondary | ICD-10-CM | POA: Diagnosis not present

## 2015-07-10 DIAGNOSIS — D509 Iron deficiency anemia, unspecified: Secondary | ICD-10-CM | POA: Diagnosis not present

## 2015-07-10 DIAGNOSIS — E8779 Other fluid overload: Secondary | ICD-10-CM | POA: Diagnosis not present

## 2015-07-10 DIAGNOSIS — N186 End stage renal disease: Secondary | ICD-10-CM | POA: Diagnosis not present

## 2015-07-12 DIAGNOSIS — N186 End stage renal disease: Secondary | ICD-10-CM | POA: Diagnosis not present

## 2015-07-12 DIAGNOSIS — E8779 Other fluid overload: Secondary | ICD-10-CM | POA: Diagnosis not present

## 2015-07-12 DIAGNOSIS — D631 Anemia in chronic kidney disease: Secondary | ICD-10-CM | POA: Diagnosis not present

## 2015-07-12 DIAGNOSIS — D509 Iron deficiency anemia, unspecified: Secondary | ICD-10-CM | POA: Diagnosis not present

## 2015-07-12 DIAGNOSIS — N2581 Secondary hyperparathyroidism of renal origin: Secondary | ICD-10-CM | POA: Diagnosis not present

## 2015-07-12 DIAGNOSIS — E1129 Type 2 diabetes mellitus with other diabetic kidney complication: Secondary | ICD-10-CM | POA: Diagnosis not present

## 2015-07-15 DIAGNOSIS — E8779 Other fluid overload: Secondary | ICD-10-CM | POA: Diagnosis not present

## 2015-07-15 DIAGNOSIS — N2581 Secondary hyperparathyroidism of renal origin: Secondary | ICD-10-CM | POA: Diagnosis not present

## 2015-07-15 DIAGNOSIS — N186 End stage renal disease: Secondary | ICD-10-CM | POA: Diagnosis not present

## 2015-07-15 DIAGNOSIS — E1129 Type 2 diabetes mellitus with other diabetic kidney complication: Secondary | ICD-10-CM | POA: Diagnosis not present

## 2015-07-15 DIAGNOSIS — D631 Anemia in chronic kidney disease: Secondary | ICD-10-CM | POA: Diagnosis not present

## 2015-07-15 DIAGNOSIS — D509 Iron deficiency anemia, unspecified: Secondary | ICD-10-CM | POA: Diagnosis not present

## 2015-07-17 DIAGNOSIS — E8779 Other fluid overload: Secondary | ICD-10-CM | POA: Diagnosis not present

## 2015-07-17 DIAGNOSIS — D631 Anemia in chronic kidney disease: Secondary | ICD-10-CM | POA: Diagnosis not present

## 2015-07-17 DIAGNOSIS — D509 Iron deficiency anemia, unspecified: Secondary | ICD-10-CM | POA: Diagnosis not present

## 2015-07-17 DIAGNOSIS — N186 End stage renal disease: Secondary | ICD-10-CM | POA: Diagnosis not present

## 2015-07-17 DIAGNOSIS — N2581 Secondary hyperparathyroidism of renal origin: Secondary | ICD-10-CM | POA: Diagnosis not present

## 2015-07-17 DIAGNOSIS — E1129 Type 2 diabetes mellitus with other diabetic kidney complication: Secondary | ICD-10-CM | POA: Diagnosis not present

## 2015-07-19 DIAGNOSIS — D631 Anemia in chronic kidney disease: Secondary | ICD-10-CM | POA: Diagnosis not present

## 2015-07-19 DIAGNOSIS — E1129 Type 2 diabetes mellitus with other diabetic kidney complication: Secondary | ICD-10-CM | POA: Diagnosis not present

## 2015-07-19 DIAGNOSIS — D509 Iron deficiency anemia, unspecified: Secondary | ICD-10-CM | POA: Diagnosis not present

## 2015-07-19 DIAGNOSIS — E8779 Other fluid overload: Secondary | ICD-10-CM | POA: Diagnosis not present

## 2015-07-19 DIAGNOSIS — N186 End stage renal disease: Secondary | ICD-10-CM | POA: Diagnosis not present

## 2015-07-19 DIAGNOSIS — N2581 Secondary hyperparathyroidism of renal origin: Secondary | ICD-10-CM | POA: Diagnosis not present

## 2015-07-21 ENCOUNTER — Encounter: Payer: Self-pay | Admitting: Vascular Surgery

## 2015-07-21 DIAGNOSIS — G319 Degenerative disease of nervous system, unspecified: Secondary | ICD-10-CM | POA: Diagnosis not present

## 2015-07-21 DIAGNOSIS — R51 Headache: Secondary | ICD-10-CM | POA: Diagnosis not present

## 2015-07-21 DIAGNOSIS — I679 Cerebrovascular disease, unspecified: Secondary | ICD-10-CM | POA: Diagnosis not present

## 2015-07-22 DIAGNOSIS — D631 Anemia in chronic kidney disease: Secondary | ICD-10-CM | POA: Diagnosis not present

## 2015-07-22 DIAGNOSIS — N186 End stage renal disease: Secondary | ICD-10-CM | POA: Diagnosis not present

## 2015-07-22 DIAGNOSIS — D509 Iron deficiency anemia, unspecified: Secondary | ICD-10-CM | POA: Diagnosis not present

## 2015-07-22 DIAGNOSIS — N2581 Secondary hyperparathyroidism of renal origin: Secondary | ICD-10-CM | POA: Diagnosis not present

## 2015-07-22 DIAGNOSIS — E8779 Other fluid overload: Secondary | ICD-10-CM | POA: Diagnosis not present

## 2015-07-22 DIAGNOSIS — E1129 Type 2 diabetes mellitus with other diabetic kidney complication: Secondary | ICD-10-CM | POA: Diagnosis not present

## 2015-07-24 DIAGNOSIS — E8779 Other fluid overload: Secondary | ICD-10-CM | POA: Diagnosis not present

## 2015-07-24 DIAGNOSIS — E1129 Type 2 diabetes mellitus with other diabetic kidney complication: Secondary | ICD-10-CM | POA: Diagnosis not present

## 2015-07-24 DIAGNOSIS — D631 Anemia in chronic kidney disease: Secondary | ICD-10-CM | POA: Diagnosis not present

## 2015-07-24 DIAGNOSIS — N2581 Secondary hyperparathyroidism of renal origin: Secondary | ICD-10-CM | POA: Diagnosis not present

## 2015-07-24 DIAGNOSIS — N186 End stage renal disease: Secondary | ICD-10-CM | POA: Diagnosis not present

## 2015-07-24 DIAGNOSIS — D509 Iron deficiency anemia, unspecified: Secondary | ICD-10-CM | POA: Diagnosis not present

## 2015-07-24 NOTE — Progress Notes (Signed)
    Postoperative Access Visit   History of Present Illness  [name] is a 82 y.o. year old female who presents for postoperative follow-up for: L groin exploration (Date: 07/07/15).  The case was aborted when the L CFA was found to unclampable throughout its length.   This patient was not consented for an iliofemoral endarterectomy with bovine patch angioplasty, so the procedure was aborted.  The patient's wounds are healing.  The patient denies any drainage from elft groin  For VQI Use Only  PRE-ADM LIVING: Home  AMB STATUS: Ambulatory   Physical Examination Filed Vitals:   07/25/15 1354  BP: 131/72  Pulse: 81    LLE: groin incision is c/d/i   Medical Decision Making  [name] is a 82 y.o. year old female who presents s/p L groin exploration which revealed unclampable left common femoral artery, ESRD requiring hemodialysis   Unfortunately, at this point, this patient is not a candidate for permanent access via primary or secondary techniques.  If offered referral to [facility] for consideration for a tertiary procedure but she declines and [facility]  Thank you for allowing us to participate in this patient's care.  Leonides SakeBrian Chen, MD [facility] Office: [phone] Pager: [phone]  07/24/2015, 2:10 PM

## 2015-07-25 ENCOUNTER — Encounter: Payer: Self-pay | Admitting: Vascular Surgery

## 2015-07-25 ENCOUNTER — Ambulatory Visit (INDEPENDENT_AMBULATORY_CARE_PROVIDER_SITE_OTHER): Payer: Medicare Other | Admitting: Vascular Surgery

## 2015-07-25 VITALS — BP 131/72 | HR 81 | Ht 62.0 in | Wt 195.2 lb

## 2015-07-25 DIAGNOSIS — Z992 Dependence on renal dialysis: Secondary | ICD-10-CM

## 2015-07-25 DIAGNOSIS — I7025 Atherosclerosis of native arteries of other extremities with ulceration: Secondary | ICD-10-CM

## 2015-07-25 DIAGNOSIS — N186 End stage renal disease: Secondary | ICD-10-CM

## 2015-07-26 DIAGNOSIS — E1129 Type 2 diabetes mellitus with other diabetic kidney complication: Secondary | ICD-10-CM | POA: Diagnosis not present

## 2015-07-26 DIAGNOSIS — D509 Iron deficiency anemia, unspecified: Secondary | ICD-10-CM | POA: Diagnosis not present

## 2015-07-26 DIAGNOSIS — N2581 Secondary hyperparathyroidism of renal origin: Secondary | ICD-10-CM | POA: Diagnosis not present

## 2015-07-26 DIAGNOSIS — D631 Anemia in chronic kidney disease: Secondary | ICD-10-CM | POA: Diagnosis not present

## 2015-07-26 DIAGNOSIS — E8779 Other fluid overload: Secondary | ICD-10-CM | POA: Diagnosis not present

## 2015-07-26 DIAGNOSIS — N186 End stage renal disease: Secondary | ICD-10-CM | POA: Diagnosis not present

## 2015-07-28 ENCOUNTER — Ambulatory Visit (INDEPENDENT_AMBULATORY_CARE_PROVIDER_SITE_OTHER): Payer: Medicare Other | Admitting: Podiatry

## 2015-07-28 ENCOUNTER — Encounter: Payer: Self-pay | Admitting: Podiatry

## 2015-07-28 VITALS — BP 106/64 | HR 76 | Resp 16

## 2015-07-28 DIAGNOSIS — M79676 Pain in unspecified toe(s): Secondary | ICD-10-CM

## 2015-07-28 DIAGNOSIS — E1151 Type 2 diabetes mellitus with diabetic peripheral angiopathy without gangrene: Secondary | ICD-10-CM | POA: Diagnosis not present

## 2015-07-28 DIAGNOSIS — B351 Tinea unguium: Secondary | ICD-10-CM

## 2015-07-28 DIAGNOSIS — Q828 Other specified congenital malformations of skin: Secondary | ICD-10-CM

## 2015-07-28 NOTE — Progress Notes (Signed)
Patient ID: [name], female   DOB: 12/16/1932, 82 y.o.   MRN: [id number]   Subjective: 82 y.o. returns the office today for painful, elongated, thickened toenails and calluses which makes it difficult to walk and she is unable to trim them herself. Denies any redness or drainage around the nails/calluses.Denies any acute changes since last appointment and no new complaints today. Denies any systemic complaints such as fevers, chills, nausea, vomiting.   Objective: AAO 3, NAD DP/PT pulses decreased Protective sensation decreased with Simms Weinstein monofilament Nails hypertrophic, dystrophic, elongated, brittle, discolored 9. There is tenderness overlying the nails 2-5 on the left and 1-5 on the right. There is no surrounding erythema or drainage along the nail sites. Hyperkeratotic lesions bilateral foot submetatarsal 1 and 5 and lateral 5th digit on the left. Upon debridement there was no underlying ulceration, drainage or other clinical signs of infection. No open lesions or pre-ulcerative lesions are identified. No other areas of tenderness bilateral lower extremities. No overlying edema, erythema, increased warmth. Previous partial auto-amputation of the left hallux which is healed at this time. No pain with calf compression, swelling, warmth, erythema.  Assessment: Patient presents with symptomatic onychomycosis; pre-ulcerative calluses    Plan: -Treatment options including alternatives, risks, complications were discussed -Nails sharply debrided 9 without complication/bleeding. -Hyperkeratotic lesion sharply debrided 3 without, complications/bleeding -Discussed daily foot inspection. If there are any changes, to call the office immediately.  -Follow-up in 3 months or sooner if any problems are to arise. In the meantime, encouraged to call the office with any questions, concerns, changes symptoms.  Ovid CurdMatthew Deisy Ozbun, DPM

## 2015-07-29 DIAGNOSIS — D631 Anemia in chronic kidney disease: Secondary | ICD-10-CM | POA: Diagnosis not present

## 2015-07-29 DIAGNOSIS — D509 Iron deficiency anemia, unspecified: Secondary | ICD-10-CM | POA: Diagnosis not present

## 2015-07-29 DIAGNOSIS — E1129 Type 2 diabetes mellitus with other diabetic kidney complication: Secondary | ICD-10-CM | POA: Diagnosis not present

## 2015-07-29 DIAGNOSIS — N2581 Secondary hyperparathyroidism of renal origin: Secondary | ICD-10-CM | POA: Diagnosis not present

## 2015-07-29 DIAGNOSIS — E8779 Other fluid overload: Secondary | ICD-10-CM | POA: Diagnosis not present

## 2015-07-29 DIAGNOSIS — N186 End stage renal disease: Secondary | ICD-10-CM | POA: Diagnosis not present

## 2015-07-31 DIAGNOSIS — E1129 Type 2 diabetes mellitus with other diabetic kidney complication: Secondary | ICD-10-CM | POA: Diagnosis not present

## 2015-07-31 DIAGNOSIS — D631 Anemia in chronic kidney disease: Secondary | ICD-10-CM | POA: Diagnosis not present

## 2015-07-31 DIAGNOSIS — N2581 Secondary hyperparathyroidism of renal origin: Secondary | ICD-10-CM | POA: Diagnosis not present

## 2015-07-31 DIAGNOSIS — E8779 Other fluid overload: Secondary | ICD-10-CM | POA: Diagnosis not present

## 2015-07-31 DIAGNOSIS — N186 End stage renal disease: Secondary | ICD-10-CM | POA: Diagnosis not present

## 2015-07-31 DIAGNOSIS — D509 Iron deficiency anemia, unspecified: Secondary | ICD-10-CM | POA: Diagnosis not present

## 2015-08-01 DIAGNOSIS — N2581 Secondary hyperparathyroidism of renal origin: Secondary | ICD-10-CM | POA: Diagnosis not present

## 2015-08-01 DIAGNOSIS — E8779 Other fluid overload: Secondary | ICD-10-CM | POA: Diagnosis not present

## 2015-08-01 DIAGNOSIS — N186 End stage renal disease: Secondary | ICD-10-CM | POA: Diagnosis not present

## 2015-08-01 DIAGNOSIS — D509 Iron deficiency anemia, unspecified: Secondary | ICD-10-CM | POA: Diagnosis not present

## 2015-08-01 DIAGNOSIS — D631 Anemia in chronic kidney disease: Secondary | ICD-10-CM | POA: Diagnosis not present

## 2015-08-01 DIAGNOSIS — E1129 Type 2 diabetes mellitus with other diabetic kidney complication: Secondary | ICD-10-CM | POA: Diagnosis not present

## 2015-08-02 DIAGNOSIS — E1129 Type 2 diabetes mellitus with other diabetic kidney complication: Secondary | ICD-10-CM | POA: Diagnosis not present

## 2015-08-02 DIAGNOSIS — N2581 Secondary hyperparathyroidism of renal origin: Secondary | ICD-10-CM | POA: Diagnosis not present

## 2015-08-02 DIAGNOSIS — D509 Iron deficiency anemia, unspecified: Secondary | ICD-10-CM | POA: Diagnosis not present

## 2015-08-02 DIAGNOSIS — E8779 Other fluid overload: Secondary | ICD-10-CM | POA: Diagnosis not present

## 2015-08-02 DIAGNOSIS — N186 End stage renal disease: Secondary | ICD-10-CM | POA: Diagnosis not present

## 2015-08-02 DIAGNOSIS — D631 Anemia in chronic kidney disease: Secondary | ICD-10-CM | POA: Diagnosis not present

## 2015-08-05 DIAGNOSIS — E8779 Other fluid overload: Secondary | ICD-10-CM | POA: Diagnosis not present

## 2015-08-05 DIAGNOSIS — D631 Anemia in chronic kidney disease: Secondary | ICD-10-CM | POA: Diagnosis not present

## 2015-08-05 DIAGNOSIS — E1129 Type 2 diabetes mellitus with other diabetic kidney complication: Secondary | ICD-10-CM | POA: Diagnosis not present

## 2015-08-05 DIAGNOSIS — N186 End stage renal disease: Secondary | ICD-10-CM | POA: Diagnosis not present

## 2015-08-05 DIAGNOSIS — N2581 Secondary hyperparathyroidism of renal origin: Secondary | ICD-10-CM | POA: Diagnosis not present

## 2015-08-05 DIAGNOSIS — D509 Iron deficiency anemia, unspecified: Secondary | ICD-10-CM | POA: Diagnosis not present

## 2015-08-07 DIAGNOSIS — N186 End stage renal disease: Secondary | ICD-10-CM | POA: Diagnosis not present

## 2015-08-07 DIAGNOSIS — E1129 Type 2 diabetes mellitus with other diabetic kidney complication: Secondary | ICD-10-CM | POA: Diagnosis not present

## 2015-08-07 DIAGNOSIS — D509 Iron deficiency anemia, unspecified: Secondary | ICD-10-CM | POA: Diagnosis not present

## 2015-08-07 DIAGNOSIS — N2581 Secondary hyperparathyroidism of renal origin: Secondary | ICD-10-CM | POA: Diagnosis not present

## 2015-08-07 DIAGNOSIS — E8779 Other fluid overload: Secondary | ICD-10-CM | POA: Diagnosis not present

## 2015-08-07 DIAGNOSIS — D631 Anemia in chronic kidney disease: Secondary | ICD-10-CM | POA: Diagnosis not present

## 2015-08-08 DIAGNOSIS — E1129 Type 2 diabetes mellitus with other diabetic kidney complication: Secondary | ICD-10-CM | POA: Diagnosis not present

## 2015-08-08 DIAGNOSIS — N186 End stage renal disease: Secondary | ICD-10-CM | POA: Diagnosis not present

## 2015-08-08 DIAGNOSIS — Z992 Dependence on renal dialysis: Secondary | ICD-10-CM | POA: Diagnosis not present

## 2015-08-09 DIAGNOSIS — N186 End stage renal disease: Secondary | ICD-10-CM | POA: Diagnosis not present

## 2015-08-09 DIAGNOSIS — N2581 Secondary hyperparathyroidism of renal origin: Secondary | ICD-10-CM | POA: Diagnosis not present

## 2015-08-09 DIAGNOSIS — D631 Anemia in chronic kidney disease: Secondary | ICD-10-CM | POA: Diagnosis not present

## 2015-08-09 DIAGNOSIS — E1129 Type 2 diabetes mellitus with other diabetic kidney complication: Secondary | ICD-10-CM | POA: Diagnosis not present

## 2015-08-09 DIAGNOSIS — D509 Iron deficiency anemia, unspecified: Secondary | ICD-10-CM | POA: Diagnosis not present

## 2015-08-12 DIAGNOSIS — N186 End stage renal disease: Secondary | ICD-10-CM | POA: Diagnosis not present

## 2015-08-12 DIAGNOSIS — N2581 Secondary hyperparathyroidism of renal origin: Secondary | ICD-10-CM | POA: Diagnosis not present

## 2015-08-12 DIAGNOSIS — D631 Anemia in chronic kidney disease: Secondary | ICD-10-CM | POA: Diagnosis not present

## 2015-08-12 DIAGNOSIS — E1129 Type 2 diabetes mellitus with other diabetic kidney complication: Secondary | ICD-10-CM | POA: Diagnosis not present

## 2015-08-12 DIAGNOSIS — D509 Iron deficiency anemia, unspecified: Secondary | ICD-10-CM | POA: Diagnosis not present

## 2015-08-14 DIAGNOSIS — D509 Iron deficiency anemia, unspecified: Secondary | ICD-10-CM | POA: Diagnosis not present

## 2015-08-14 DIAGNOSIS — N2581 Secondary hyperparathyroidism of renal origin: Secondary | ICD-10-CM | POA: Diagnosis not present

## 2015-08-14 DIAGNOSIS — N186 End stage renal disease: Secondary | ICD-10-CM | POA: Diagnosis not present

## 2015-08-14 DIAGNOSIS — D631 Anemia in chronic kidney disease: Secondary | ICD-10-CM | POA: Diagnosis not present

## 2015-08-14 DIAGNOSIS — E1129 Type 2 diabetes mellitus with other diabetic kidney complication: Secondary | ICD-10-CM | POA: Diagnosis not present

## 2015-08-16 DIAGNOSIS — N186 End stage renal disease: Secondary | ICD-10-CM | POA: Diagnosis not present

## 2015-08-16 DIAGNOSIS — N2581 Secondary hyperparathyroidism of renal origin: Secondary | ICD-10-CM | POA: Diagnosis not present

## 2015-08-16 DIAGNOSIS — E1129 Type 2 diabetes mellitus with other diabetic kidney complication: Secondary | ICD-10-CM | POA: Diagnosis not present

## 2015-08-16 DIAGNOSIS — D509 Iron deficiency anemia, unspecified: Secondary | ICD-10-CM | POA: Diagnosis not present

## 2015-08-16 DIAGNOSIS — D631 Anemia in chronic kidney disease: Secondary | ICD-10-CM | POA: Diagnosis not present

## 2015-08-19 DIAGNOSIS — D509 Iron deficiency anemia, unspecified: Secondary | ICD-10-CM | POA: Diagnosis not present

## 2015-08-19 DIAGNOSIS — N2581 Secondary hyperparathyroidism of renal origin: Secondary | ICD-10-CM | POA: Diagnosis not present

## 2015-08-19 DIAGNOSIS — D631 Anemia in chronic kidney disease: Secondary | ICD-10-CM | POA: Diagnosis not present

## 2015-08-19 DIAGNOSIS — E1129 Type 2 diabetes mellitus with other diabetic kidney complication: Secondary | ICD-10-CM | POA: Diagnosis not present

## 2015-08-19 DIAGNOSIS — N186 End stage renal disease: Secondary | ICD-10-CM | POA: Diagnosis not present

## 2015-08-21 DIAGNOSIS — N186 End stage renal disease: Secondary | ICD-10-CM | POA: Diagnosis not present

## 2015-08-21 DIAGNOSIS — E1129 Type 2 diabetes mellitus with other diabetic kidney complication: Secondary | ICD-10-CM | POA: Diagnosis not present

## 2015-08-21 DIAGNOSIS — D509 Iron deficiency anemia, unspecified: Secondary | ICD-10-CM | POA: Diagnosis not present

## 2015-08-21 DIAGNOSIS — N2581 Secondary hyperparathyroidism of renal origin: Secondary | ICD-10-CM | POA: Diagnosis not present

## 2015-08-21 DIAGNOSIS — D631 Anemia in chronic kidney disease: Secondary | ICD-10-CM | POA: Diagnosis not present

## 2015-08-23 DIAGNOSIS — N2581 Secondary hyperparathyroidism of renal origin: Secondary | ICD-10-CM | POA: Diagnosis not present

## 2015-08-23 DIAGNOSIS — D509 Iron deficiency anemia, unspecified: Secondary | ICD-10-CM | POA: Diagnosis not present

## 2015-08-23 DIAGNOSIS — E1129 Type 2 diabetes mellitus with other diabetic kidney complication: Secondary | ICD-10-CM | POA: Diagnosis not present

## 2015-08-23 DIAGNOSIS — N186 End stage renal disease: Secondary | ICD-10-CM | POA: Diagnosis not present

## 2015-08-23 DIAGNOSIS — D631 Anemia in chronic kidney disease: Secondary | ICD-10-CM | POA: Diagnosis not present

## 2015-08-26 DIAGNOSIS — N2581 Secondary hyperparathyroidism of renal origin: Secondary | ICD-10-CM | POA: Diagnosis not present

## 2015-08-26 DIAGNOSIS — E1129 Type 2 diabetes mellitus with other diabetic kidney complication: Secondary | ICD-10-CM | POA: Diagnosis not present

## 2015-08-26 DIAGNOSIS — D631 Anemia in chronic kidney disease: Secondary | ICD-10-CM | POA: Diagnosis not present

## 2015-08-26 DIAGNOSIS — D509 Iron deficiency anemia, unspecified: Secondary | ICD-10-CM | POA: Diagnosis not present

## 2015-08-26 DIAGNOSIS — N186 End stage renal disease: Secondary | ICD-10-CM | POA: Diagnosis not present

## 2015-08-28 DIAGNOSIS — D631 Anemia in chronic kidney disease: Secondary | ICD-10-CM | POA: Diagnosis not present

## 2015-08-28 DIAGNOSIS — N186 End stage renal disease: Secondary | ICD-10-CM | POA: Diagnosis not present

## 2015-08-28 DIAGNOSIS — D509 Iron deficiency anemia, unspecified: Secondary | ICD-10-CM | POA: Diagnosis not present

## 2015-08-28 DIAGNOSIS — E1129 Type 2 diabetes mellitus with other diabetic kidney complication: Secondary | ICD-10-CM | POA: Diagnosis not present

## 2015-08-28 DIAGNOSIS — N2581 Secondary hyperparathyroidism of renal origin: Secondary | ICD-10-CM | POA: Diagnosis not present

## 2015-08-30 DIAGNOSIS — N186 End stage renal disease: Secondary | ICD-10-CM | POA: Diagnosis not present

## 2015-08-30 DIAGNOSIS — N2581 Secondary hyperparathyroidism of renal origin: Secondary | ICD-10-CM | POA: Diagnosis not present

## 2015-08-30 DIAGNOSIS — E1129 Type 2 diabetes mellitus with other diabetic kidney complication: Secondary | ICD-10-CM | POA: Diagnosis not present

## 2015-08-30 DIAGNOSIS — D509 Iron deficiency anemia, unspecified: Secondary | ICD-10-CM | POA: Diagnosis not present

## 2015-08-30 DIAGNOSIS — D631 Anemia in chronic kidney disease: Secondary | ICD-10-CM | POA: Diagnosis not present

## 2015-09-02 DIAGNOSIS — D509 Iron deficiency anemia, unspecified: Secondary | ICD-10-CM | POA: Diagnosis not present

## 2015-09-02 DIAGNOSIS — N2581 Secondary hyperparathyroidism of renal origin: Secondary | ICD-10-CM | POA: Diagnosis not present

## 2015-09-02 DIAGNOSIS — E1129 Type 2 diabetes mellitus with other diabetic kidney complication: Secondary | ICD-10-CM | POA: Diagnosis not present

## 2015-09-02 DIAGNOSIS — D631 Anemia in chronic kidney disease: Secondary | ICD-10-CM | POA: Diagnosis not present

## 2015-09-02 DIAGNOSIS — N186 End stage renal disease: Secondary | ICD-10-CM | POA: Diagnosis not present

## 2015-09-04 DIAGNOSIS — N2581 Secondary hyperparathyroidism of renal origin: Secondary | ICD-10-CM | POA: Diagnosis not present

## 2015-09-04 DIAGNOSIS — N186 End stage renal disease: Secondary | ICD-10-CM | POA: Diagnosis not present

## 2015-09-04 DIAGNOSIS — D509 Iron deficiency anemia, unspecified: Secondary | ICD-10-CM | POA: Diagnosis not present

## 2015-09-04 DIAGNOSIS — E1129 Type 2 diabetes mellitus with other diabetic kidney complication: Secondary | ICD-10-CM | POA: Diagnosis not present

## 2015-09-04 DIAGNOSIS — D631 Anemia in chronic kidney disease: Secondary | ICD-10-CM | POA: Diagnosis not present

## 2015-09-06 DIAGNOSIS — N2581 Secondary hyperparathyroidism of renal origin: Secondary | ICD-10-CM | POA: Diagnosis not present

## 2015-09-06 DIAGNOSIS — D509 Iron deficiency anemia, unspecified: Secondary | ICD-10-CM | POA: Diagnosis not present

## 2015-09-06 DIAGNOSIS — E1129 Type 2 diabetes mellitus with other diabetic kidney complication: Secondary | ICD-10-CM | POA: Diagnosis not present

## 2015-09-06 DIAGNOSIS — D631 Anemia in chronic kidney disease: Secondary | ICD-10-CM | POA: Diagnosis not present

## 2015-09-06 DIAGNOSIS — N186 End stage renal disease: Secondary | ICD-10-CM | POA: Diagnosis not present

## 2015-09-07 DIAGNOSIS — Z992 Dependence on renal dialysis: Secondary | ICD-10-CM | POA: Diagnosis not present

## 2015-09-07 DIAGNOSIS — N186 End stage renal disease: Secondary | ICD-10-CM | POA: Diagnosis not present

## 2015-09-07 DIAGNOSIS — E1129 Type 2 diabetes mellitus with other diabetic kidney complication: Secondary | ICD-10-CM | POA: Diagnosis not present

## 2015-09-09 DIAGNOSIS — N2581 Secondary hyperparathyroidism of renal origin: Secondary | ICD-10-CM | POA: Diagnosis not present

## 2015-09-09 DIAGNOSIS — N186 End stage renal disease: Secondary | ICD-10-CM | POA: Diagnosis not present

## 2015-09-09 DIAGNOSIS — E1129 Type 2 diabetes mellitus with other diabetic kidney complication: Secondary | ICD-10-CM | POA: Diagnosis not present

## 2015-09-09 DIAGNOSIS — D631 Anemia in chronic kidney disease: Secondary | ICD-10-CM | POA: Diagnosis not present

## 2015-09-09 DIAGNOSIS — D509 Iron deficiency anemia, unspecified: Secondary | ICD-10-CM | POA: Diagnosis not present

## 2015-09-11 DIAGNOSIS — D631 Anemia in chronic kidney disease: Secondary | ICD-10-CM | POA: Diagnosis not present

## 2015-09-11 DIAGNOSIS — E1129 Type 2 diabetes mellitus with other diabetic kidney complication: Secondary | ICD-10-CM | POA: Diagnosis not present

## 2015-09-11 DIAGNOSIS — D509 Iron deficiency anemia, unspecified: Secondary | ICD-10-CM | POA: Diagnosis not present

## 2015-09-11 DIAGNOSIS — N186 End stage renal disease: Secondary | ICD-10-CM | POA: Diagnosis not present

## 2015-09-11 DIAGNOSIS — N2581 Secondary hyperparathyroidism of renal origin: Secondary | ICD-10-CM | POA: Diagnosis not present

## 2015-09-13 DIAGNOSIS — N186 End stage renal disease: Secondary | ICD-10-CM | POA: Diagnosis not present

## 2015-09-13 DIAGNOSIS — E1129 Type 2 diabetes mellitus with other diabetic kidney complication: Secondary | ICD-10-CM | POA: Diagnosis not present

## 2015-09-13 DIAGNOSIS — D631 Anemia in chronic kidney disease: Secondary | ICD-10-CM | POA: Diagnosis not present

## 2015-09-13 DIAGNOSIS — D509 Iron deficiency anemia, unspecified: Secondary | ICD-10-CM | POA: Diagnosis not present

## 2015-09-13 DIAGNOSIS — N2581 Secondary hyperparathyroidism of renal origin: Secondary | ICD-10-CM | POA: Diagnosis not present

## 2015-09-16 DIAGNOSIS — N186 End stage renal disease: Secondary | ICD-10-CM | POA: Diagnosis not present

## 2015-09-16 DIAGNOSIS — E1129 Type 2 diabetes mellitus with other diabetic kidney complication: Secondary | ICD-10-CM | POA: Diagnosis not present

## 2015-09-16 DIAGNOSIS — N2581 Secondary hyperparathyroidism of renal origin: Secondary | ICD-10-CM | POA: Diagnosis not present

## 2015-09-16 DIAGNOSIS — D509 Iron deficiency anemia, unspecified: Secondary | ICD-10-CM | POA: Diagnosis not present

## 2015-09-16 DIAGNOSIS — D631 Anemia in chronic kidney disease: Secondary | ICD-10-CM | POA: Diagnosis not present

## 2015-09-18 DIAGNOSIS — N186 End stage renal disease: Secondary | ICD-10-CM | POA: Diagnosis not present

## 2015-09-18 DIAGNOSIS — D509 Iron deficiency anemia, unspecified: Secondary | ICD-10-CM | POA: Diagnosis not present

## 2015-09-18 DIAGNOSIS — E1129 Type 2 diabetes mellitus with other diabetic kidney complication: Secondary | ICD-10-CM | POA: Diagnosis not present

## 2015-09-18 DIAGNOSIS — N2581 Secondary hyperparathyroidism of renal origin: Secondary | ICD-10-CM | POA: Diagnosis not present

## 2015-09-18 DIAGNOSIS — D631 Anemia in chronic kidney disease: Secondary | ICD-10-CM | POA: Diagnosis not present

## 2015-09-20 DIAGNOSIS — N186 End stage renal disease: Secondary | ICD-10-CM | POA: Diagnosis not present

## 2015-09-20 DIAGNOSIS — D509 Iron deficiency anemia, unspecified: Secondary | ICD-10-CM | POA: Diagnosis not present

## 2015-09-20 DIAGNOSIS — N2581 Secondary hyperparathyroidism of renal origin: Secondary | ICD-10-CM | POA: Diagnosis not present

## 2015-09-20 DIAGNOSIS — D631 Anemia in chronic kidney disease: Secondary | ICD-10-CM | POA: Diagnosis not present

## 2015-09-20 DIAGNOSIS — E1129 Type 2 diabetes mellitus with other diabetic kidney complication: Secondary | ICD-10-CM | POA: Diagnosis not present

## 2015-09-23 DIAGNOSIS — N186 End stage renal disease: Secondary | ICD-10-CM | POA: Diagnosis not present

## 2015-09-23 DIAGNOSIS — N2581 Secondary hyperparathyroidism of renal origin: Secondary | ICD-10-CM | POA: Diagnosis not present

## 2015-09-23 DIAGNOSIS — D509 Iron deficiency anemia, unspecified: Secondary | ICD-10-CM | POA: Diagnosis not present

## 2015-09-23 DIAGNOSIS — D631 Anemia in chronic kidney disease: Secondary | ICD-10-CM | POA: Diagnosis not present

## 2015-09-23 DIAGNOSIS — E1129 Type 2 diabetes mellitus with other diabetic kidney complication: Secondary | ICD-10-CM | POA: Diagnosis not present

## 2015-09-25 DIAGNOSIS — N2581 Secondary hyperparathyroidism of renal origin: Secondary | ICD-10-CM | POA: Diagnosis not present

## 2015-09-25 DIAGNOSIS — N186 End stage renal disease: Secondary | ICD-10-CM | POA: Diagnosis not present

## 2015-09-25 DIAGNOSIS — D509 Iron deficiency anemia, unspecified: Secondary | ICD-10-CM | POA: Diagnosis not present

## 2015-09-25 DIAGNOSIS — D631 Anemia in chronic kidney disease: Secondary | ICD-10-CM | POA: Diagnosis not present

## 2015-09-25 DIAGNOSIS — E1129 Type 2 diabetes mellitus with other diabetic kidney complication: Secondary | ICD-10-CM | POA: Diagnosis not present

## 2015-09-27 DIAGNOSIS — N2581 Secondary hyperparathyroidism of renal origin: Secondary | ICD-10-CM | POA: Diagnosis not present

## 2015-09-27 DIAGNOSIS — D631 Anemia in chronic kidney disease: Secondary | ICD-10-CM | POA: Diagnosis not present

## 2015-09-27 DIAGNOSIS — E1129 Type 2 diabetes mellitus with other diabetic kidney complication: Secondary | ICD-10-CM | POA: Diagnosis not present

## 2015-09-27 DIAGNOSIS — N186 End stage renal disease: Secondary | ICD-10-CM | POA: Diagnosis not present

## 2015-09-27 DIAGNOSIS — D509 Iron deficiency anemia, unspecified: Secondary | ICD-10-CM | POA: Diagnosis not present

## 2015-09-30 DIAGNOSIS — N2581 Secondary hyperparathyroidism of renal origin: Secondary | ICD-10-CM | POA: Diagnosis not present

## 2015-09-30 DIAGNOSIS — D631 Anemia in chronic kidney disease: Secondary | ICD-10-CM | POA: Diagnosis not present

## 2015-09-30 DIAGNOSIS — N186 End stage renal disease: Secondary | ICD-10-CM | POA: Diagnosis not present

## 2015-09-30 DIAGNOSIS — E1129 Type 2 diabetes mellitus with other diabetic kidney complication: Secondary | ICD-10-CM | POA: Diagnosis not present

## 2015-09-30 DIAGNOSIS — D509 Iron deficiency anemia, unspecified: Secondary | ICD-10-CM | POA: Diagnosis not present

## 2015-10-02 DIAGNOSIS — N186 End stage renal disease: Secondary | ICD-10-CM | POA: Diagnosis not present

## 2015-10-02 DIAGNOSIS — D631 Anemia in chronic kidney disease: Secondary | ICD-10-CM | POA: Diagnosis not present

## 2015-10-02 DIAGNOSIS — E1129 Type 2 diabetes mellitus with other diabetic kidney complication: Secondary | ICD-10-CM | POA: Diagnosis not present

## 2015-10-02 DIAGNOSIS — N2581 Secondary hyperparathyroidism of renal origin: Secondary | ICD-10-CM | POA: Diagnosis not present

## 2015-10-02 DIAGNOSIS — D509 Iron deficiency anemia, unspecified: Secondary | ICD-10-CM | POA: Diagnosis not present

## 2015-10-04 DIAGNOSIS — E1129 Type 2 diabetes mellitus with other diabetic kidney complication: Secondary | ICD-10-CM | POA: Diagnosis not present

## 2015-10-04 DIAGNOSIS — D631 Anemia in chronic kidney disease: Secondary | ICD-10-CM | POA: Diagnosis not present

## 2015-10-04 DIAGNOSIS — D509 Iron deficiency anemia, unspecified: Secondary | ICD-10-CM | POA: Diagnosis not present

## 2015-10-04 DIAGNOSIS — N186 End stage renal disease: Secondary | ICD-10-CM | POA: Diagnosis not present

## 2015-10-04 DIAGNOSIS — N2581 Secondary hyperparathyroidism of renal origin: Secondary | ICD-10-CM | POA: Diagnosis not present

## 2015-10-07 DIAGNOSIS — D631 Anemia in chronic kidney disease: Secondary | ICD-10-CM | POA: Diagnosis not present

## 2015-10-07 DIAGNOSIS — D509 Iron deficiency anemia, unspecified: Secondary | ICD-10-CM | POA: Diagnosis not present

## 2015-10-07 DIAGNOSIS — E1129 Type 2 diabetes mellitus with other diabetic kidney complication: Secondary | ICD-10-CM | POA: Diagnosis not present

## 2015-10-07 DIAGNOSIS — N2581 Secondary hyperparathyroidism of renal origin: Secondary | ICD-10-CM | POA: Diagnosis not present

## 2015-10-07 DIAGNOSIS — N186 End stage renal disease: Secondary | ICD-10-CM | POA: Diagnosis not present

## 2015-10-08 DIAGNOSIS — E1129 Type 2 diabetes mellitus with other diabetic kidney complication: Secondary | ICD-10-CM | POA: Diagnosis not present

## 2015-10-08 DIAGNOSIS — N186 End stage renal disease: Secondary | ICD-10-CM | POA: Diagnosis not present

## 2015-10-08 DIAGNOSIS — Z992 Dependence on renal dialysis: Secondary | ICD-10-CM | POA: Diagnosis not present

## 2015-10-09 DIAGNOSIS — E1129 Type 2 diabetes mellitus with other diabetic kidney complication: Secondary | ICD-10-CM | POA: Diagnosis not present

## 2015-10-09 DIAGNOSIS — N186 End stage renal disease: Secondary | ICD-10-CM | POA: Diagnosis not present

## 2015-10-09 DIAGNOSIS — N2581 Secondary hyperparathyroidism of renal origin: Secondary | ICD-10-CM | POA: Diagnosis not present

## 2015-10-09 DIAGNOSIS — D631 Anemia in chronic kidney disease: Secondary | ICD-10-CM | POA: Diagnosis not present

## 2015-10-09 DIAGNOSIS — D509 Iron deficiency anemia, unspecified: Secondary | ICD-10-CM | POA: Diagnosis not present

## 2015-10-11 DIAGNOSIS — D631 Anemia in chronic kidney disease: Secondary | ICD-10-CM | POA: Diagnosis not present

## 2015-10-11 DIAGNOSIS — E1129 Type 2 diabetes mellitus with other diabetic kidney complication: Secondary | ICD-10-CM | POA: Diagnosis not present

## 2015-10-11 DIAGNOSIS — N2581 Secondary hyperparathyroidism of renal origin: Secondary | ICD-10-CM | POA: Diagnosis not present

## 2015-10-11 DIAGNOSIS — N186 End stage renal disease: Secondary | ICD-10-CM | POA: Diagnosis not present

## 2015-10-11 DIAGNOSIS — D509 Iron deficiency anemia, unspecified: Secondary | ICD-10-CM | POA: Diagnosis not present

## 2015-10-14 DIAGNOSIS — E1129 Type 2 diabetes mellitus with other diabetic kidney complication: Secondary | ICD-10-CM | POA: Diagnosis not present

## 2015-10-14 DIAGNOSIS — N186 End stage renal disease: Secondary | ICD-10-CM | POA: Diagnosis not present

## 2015-10-14 DIAGNOSIS — D509 Iron deficiency anemia, unspecified: Secondary | ICD-10-CM | POA: Diagnosis not present

## 2015-10-14 DIAGNOSIS — N2581 Secondary hyperparathyroidism of renal origin: Secondary | ICD-10-CM | POA: Diagnosis not present

## 2015-10-14 DIAGNOSIS — D631 Anemia in chronic kidney disease: Secondary | ICD-10-CM | POA: Diagnosis not present

## 2015-10-16 DIAGNOSIS — N2581 Secondary hyperparathyroidism of renal origin: Secondary | ICD-10-CM | POA: Diagnosis not present

## 2015-10-16 DIAGNOSIS — N186 End stage renal disease: Secondary | ICD-10-CM | POA: Diagnosis not present

## 2015-10-16 DIAGNOSIS — E1129 Type 2 diabetes mellitus with other diabetic kidney complication: Secondary | ICD-10-CM | POA: Diagnosis not present

## 2015-10-16 DIAGNOSIS — D631 Anemia in chronic kidney disease: Secondary | ICD-10-CM | POA: Diagnosis not present

## 2015-10-16 DIAGNOSIS — D509 Iron deficiency anemia, unspecified: Secondary | ICD-10-CM | POA: Diagnosis not present

## 2015-10-17 DIAGNOSIS — E1129 Type 2 diabetes mellitus with other diabetic kidney complication: Secondary | ICD-10-CM | POA: Diagnosis not present

## 2015-10-17 DIAGNOSIS — N2581 Secondary hyperparathyroidism of renal origin: Secondary | ICD-10-CM | POA: Diagnosis not present

## 2015-10-17 DIAGNOSIS — D509 Iron deficiency anemia, unspecified: Secondary | ICD-10-CM | POA: Diagnosis not present

## 2015-10-17 DIAGNOSIS — D631 Anemia in chronic kidney disease: Secondary | ICD-10-CM | POA: Diagnosis not present

## 2015-10-17 DIAGNOSIS — N186 End stage renal disease: Secondary | ICD-10-CM | POA: Diagnosis not present

## 2015-10-21 DIAGNOSIS — N2581 Secondary hyperparathyroidism of renal origin: Secondary | ICD-10-CM | POA: Diagnosis not present

## 2015-10-21 DIAGNOSIS — N186 End stage renal disease: Secondary | ICD-10-CM | POA: Diagnosis not present

## 2015-10-21 DIAGNOSIS — D509 Iron deficiency anemia, unspecified: Secondary | ICD-10-CM | POA: Diagnosis not present

## 2015-10-21 DIAGNOSIS — E1129 Type 2 diabetes mellitus with other diabetic kidney complication: Secondary | ICD-10-CM | POA: Diagnosis not present

## 2015-10-21 DIAGNOSIS — D631 Anemia in chronic kidney disease: Secondary | ICD-10-CM | POA: Diagnosis not present

## 2015-10-23 DIAGNOSIS — E1129 Type 2 diabetes mellitus with other diabetic kidney complication: Secondary | ICD-10-CM | POA: Diagnosis not present

## 2015-10-23 DIAGNOSIS — D509 Iron deficiency anemia, unspecified: Secondary | ICD-10-CM | POA: Diagnosis not present

## 2015-10-23 DIAGNOSIS — D631 Anemia in chronic kidney disease: Secondary | ICD-10-CM | POA: Diagnosis not present

## 2015-10-23 DIAGNOSIS — N186 End stage renal disease: Secondary | ICD-10-CM | POA: Diagnosis not present

## 2015-10-23 DIAGNOSIS — N2581 Secondary hyperparathyroidism of renal origin: Secondary | ICD-10-CM | POA: Diagnosis not present

## 2015-10-25 DIAGNOSIS — E1129 Type 2 diabetes mellitus with other diabetic kidney complication: Secondary | ICD-10-CM | POA: Diagnosis not present

## 2015-10-25 DIAGNOSIS — D509 Iron deficiency anemia, unspecified: Secondary | ICD-10-CM | POA: Diagnosis not present

## 2015-10-25 DIAGNOSIS — N2581 Secondary hyperparathyroidism of renal origin: Secondary | ICD-10-CM | POA: Diagnosis not present

## 2015-10-25 DIAGNOSIS — N186 End stage renal disease: Secondary | ICD-10-CM | POA: Diagnosis not present

## 2015-10-25 DIAGNOSIS — D631 Anemia in chronic kidney disease: Secondary | ICD-10-CM | POA: Diagnosis not present

## 2015-10-28 DIAGNOSIS — D631 Anemia in chronic kidney disease: Secondary | ICD-10-CM | POA: Diagnosis not present

## 2015-10-28 DIAGNOSIS — D509 Iron deficiency anemia, unspecified: Secondary | ICD-10-CM | POA: Diagnosis not present

## 2015-10-28 DIAGNOSIS — N186 End stage renal disease: Secondary | ICD-10-CM | POA: Diagnosis not present

## 2015-10-28 DIAGNOSIS — N2581 Secondary hyperparathyroidism of renal origin: Secondary | ICD-10-CM | POA: Diagnosis not present

## 2015-10-28 DIAGNOSIS — E1129 Type 2 diabetes mellitus with other diabetic kidney complication: Secondary | ICD-10-CM | POA: Diagnosis not present

## 2015-10-30 DIAGNOSIS — N186 End stage renal disease: Secondary | ICD-10-CM | POA: Diagnosis not present

## 2015-10-30 DIAGNOSIS — D509 Iron deficiency anemia, unspecified: Secondary | ICD-10-CM | POA: Diagnosis not present

## 2015-10-30 DIAGNOSIS — D631 Anemia in chronic kidney disease: Secondary | ICD-10-CM | POA: Diagnosis not present

## 2015-10-30 DIAGNOSIS — E1129 Type 2 diabetes mellitus with other diabetic kidney complication: Secondary | ICD-10-CM | POA: Diagnosis not present

## 2015-10-30 DIAGNOSIS — N2581 Secondary hyperparathyroidism of renal origin: Secondary | ICD-10-CM | POA: Diagnosis not present

## 2015-10-31 ENCOUNTER — Encounter: Payer: Self-pay | Admitting: Podiatry

## 2015-10-31 ENCOUNTER — Ambulatory Visit (INDEPENDENT_AMBULATORY_CARE_PROVIDER_SITE_OTHER): Payer: Medicare Other | Admitting: Podiatry

## 2015-10-31 DIAGNOSIS — Q828 Other specified congenital malformations of skin: Secondary | ICD-10-CM | POA: Diagnosis not present

## 2015-10-31 DIAGNOSIS — M79676 Pain in unspecified toe(s): Secondary | ICD-10-CM | POA: Diagnosis not present

## 2015-10-31 DIAGNOSIS — I739 Peripheral vascular disease, unspecified: Secondary | ICD-10-CM

## 2015-10-31 DIAGNOSIS — E1151 Type 2 diabetes mellitus with diabetic peripheral angiopathy without gangrene: Secondary | ICD-10-CM | POA: Diagnosis not present

## 2015-10-31 DIAGNOSIS — B351 Tinea unguium: Secondary | ICD-10-CM | POA: Diagnosis not present

## 2015-10-31 NOTE — Progress Notes (Signed)
Patient ID: [name], female   DOB: 10/06/1932, 82 y.o.   MRN: [id number]  Subjective: 82 y.o. returns the office today for painful, elongated, thickened toenails and calluses which she cannot trim herself.. Denies any redness or drainage around the nails/calluses. Denies any acute changes since last appointment and no new complaints today. Denies any systemic complaints such as fevers, chills, nausea, vomiting.   Objective: AAO 3, NAD DP/PT pulses decreased Protective sensation decreased with Simms Weinstein monofilament Nails hypertrophic, dystrophic, elongated, brittle, discolored 9. There is tenderness overlying the nails 2-5 on the left and 1-5 on the right. There is no surrounding erythema or drainage along the nail sites. Hyperkeratotic lesions bilateral foot submetatarsal 1 and 5 and lateral 5th digit on the left. Upon debridement there was no underlying ulceration, drainage or other clinical signs of infection. No open lesions or pre-ulcerative lesions are identified. No other areas of tenderness bilateral lower extremities. No overlying edema, erythema, increased warmth. Previous partial auto-amputation of the left hallux which is healed at this time. No pain with calf compression, swelling, warmth, erythema.  Assessment: Patient presents with symptomatic onychomycosis; pre-ulcerative calluses    Plan: -Treatment options including alternatives, risks, complications were discussed -Nails sharply debrided 9 without complication/bleeding. -Hyperkeratotic lesion sharply debrided 3 without, complications/bleeding -Discussed daily foot inspection. If there are any changes, to call the office immediately.  -Follow-up in 3 months or sooner if any problems are to arise. In the meantime, encouraged to call the office with any questions, concerns, changes symptoms.  Ovid CurdMatthew Wagoner, DPM

## 2015-11-01 DIAGNOSIS — N186 End stage renal disease: Secondary | ICD-10-CM | POA: Diagnosis not present

## 2015-11-01 DIAGNOSIS — D631 Anemia in chronic kidney disease: Secondary | ICD-10-CM | POA: Diagnosis not present

## 2015-11-01 DIAGNOSIS — N2581 Secondary hyperparathyroidism of renal origin: Secondary | ICD-10-CM | POA: Diagnosis not present

## 2015-11-01 DIAGNOSIS — E1129 Type 2 diabetes mellitus with other diabetic kidney complication: Secondary | ICD-10-CM | POA: Diagnosis not present

## 2015-11-01 DIAGNOSIS — D509 Iron deficiency anemia, unspecified: Secondary | ICD-10-CM | POA: Diagnosis not present

## 2015-11-04 DIAGNOSIS — D631 Anemia in chronic kidney disease: Secondary | ICD-10-CM | POA: Diagnosis not present

## 2015-11-04 DIAGNOSIS — E1129 Type 2 diabetes mellitus with other diabetic kidney complication: Secondary | ICD-10-CM | POA: Diagnosis not present

## 2015-11-04 DIAGNOSIS — N2581 Secondary hyperparathyroidism of renal origin: Secondary | ICD-10-CM | POA: Diagnosis not present

## 2015-11-04 DIAGNOSIS — D509 Iron deficiency anemia, unspecified: Secondary | ICD-10-CM | POA: Diagnosis not present

## 2015-11-04 DIAGNOSIS — N186 End stage renal disease: Secondary | ICD-10-CM | POA: Diagnosis not present

## 2015-11-06 DIAGNOSIS — N2581 Secondary hyperparathyroidism of renal origin: Secondary | ICD-10-CM | POA: Diagnosis not present

## 2015-11-06 DIAGNOSIS — E1129 Type 2 diabetes mellitus with other diabetic kidney complication: Secondary | ICD-10-CM | POA: Diagnosis not present

## 2015-11-06 DIAGNOSIS — D631 Anemia in chronic kidney disease: Secondary | ICD-10-CM | POA: Diagnosis not present

## 2015-11-06 DIAGNOSIS — D509 Iron deficiency anemia, unspecified: Secondary | ICD-10-CM | POA: Diagnosis not present

## 2015-11-06 DIAGNOSIS — N186 End stage renal disease: Secondary | ICD-10-CM | POA: Diagnosis not present

## 2015-11-07 DIAGNOSIS — N186 End stage renal disease: Secondary | ICD-10-CM | POA: Diagnosis not present

## 2015-11-07 DIAGNOSIS — Z992 Dependence on renal dialysis: Secondary | ICD-10-CM | POA: Diagnosis not present

## 2015-11-07 DIAGNOSIS — E1129 Type 2 diabetes mellitus with other diabetic kidney complication: Secondary | ICD-10-CM | POA: Diagnosis not present

## 2015-11-08 DIAGNOSIS — E1129 Type 2 diabetes mellitus with other diabetic kidney complication: Secondary | ICD-10-CM | POA: Diagnosis not present

## 2015-11-08 DIAGNOSIS — N186 End stage renal disease: Secondary | ICD-10-CM | POA: Diagnosis not present

## 2015-11-08 DIAGNOSIS — D631 Anemia in chronic kidney disease: Secondary | ICD-10-CM | POA: Diagnosis not present

## 2015-11-08 DIAGNOSIS — N2581 Secondary hyperparathyroidism of renal origin: Secondary | ICD-10-CM | POA: Diagnosis not present

## 2015-11-11 DIAGNOSIS — D631 Anemia in chronic kidney disease: Secondary | ICD-10-CM | POA: Diagnosis not present

## 2015-11-11 DIAGNOSIS — N186 End stage renal disease: Secondary | ICD-10-CM | POA: Diagnosis not present

## 2015-11-11 DIAGNOSIS — E1129 Type 2 diabetes mellitus with other diabetic kidney complication: Secondary | ICD-10-CM | POA: Diagnosis not present

## 2015-11-11 DIAGNOSIS — N2581 Secondary hyperparathyroidism of renal origin: Secondary | ICD-10-CM | POA: Diagnosis not present

## 2015-11-13 DIAGNOSIS — N2581 Secondary hyperparathyroidism of renal origin: Secondary | ICD-10-CM | POA: Diagnosis not present

## 2015-11-13 DIAGNOSIS — N186 End stage renal disease: Secondary | ICD-10-CM | POA: Diagnosis not present

## 2015-11-13 DIAGNOSIS — D631 Anemia in chronic kidney disease: Secondary | ICD-10-CM | POA: Diagnosis not present

## 2015-11-13 DIAGNOSIS — E1129 Type 2 diabetes mellitus with other diabetic kidney complication: Secondary | ICD-10-CM | POA: Diagnosis not present

## 2015-11-15 DIAGNOSIS — N2581 Secondary hyperparathyroidism of renal origin: Secondary | ICD-10-CM | POA: Diagnosis not present

## 2015-11-15 DIAGNOSIS — D631 Anemia in chronic kidney disease: Secondary | ICD-10-CM | POA: Diagnosis not present

## 2015-11-15 DIAGNOSIS — E1129 Type 2 diabetes mellitus with other diabetic kidney complication: Secondary | ICD-10-CM | POA: Diagnosis not present

## 2015-11-15 DIAGNOSIS — N186 End stage renal disease: Secondary | ICD-10-CM | POA: Diagnosis not present

## 2015-11-18 DIAGNOSIS — E1129 Type 2 diabetes mellitus with other diabetic kidney complication: Secondary | ICD-10-CM | POA: Diagnosis not present

## 2015-11-18 DIAGNOSIS — N186 End stage renal disease: Secondary | ICD-10-CM | POA: Diagnosis not present

## 2015-11-18 DIAGNOSIS — D631 Anemia in chronic kidney disease: Secondary | ICD-10-CM | POA: Diagnosis not present

## 2015-11-18 DIAGNOSIS — N2581 Secondary hyperparathyroidism of renal origin: Secondary | ICD-10-CM | POA: Diagnosis not present

## 2015-11-20 DIAGNOSIS — N2581 Secondary hyperparathyroidism of renal origin: Secondary | ICD-10-CM | POA: Diagnosis not present

## 2015-11-20 DIAGNOSIS — D631 Anemia in chronic kidney disease: Secondary | ICD-10-CM | POA: Diagnosis not present

## 2015-11-20 DIAGNOSIS — N186 End stage renal disease: Secondary | ICD-10-CM | POA: Diagnosis not present

## 2015-11-20 DIAGNOSIS — E1129 Type 2 diabetes mellitus with other diabetic kidney complication: Secondary | ICD-10-CM | POA: Diagnosis not present

## 2015-11-22 DIAGNOSIS — N186 End stage renal disease: Secondary | ICD-10-CM | POA: Diagnosis not present

## 2015-11-22 DIAGNOSIS — N2581 Secondary hyperparathyroidism of renal origin: Secondary | ICD-10-CM | POA: Diagnosis not present

## 2015-11-22 DIAGNOSIS — D631 Anemia in chronic kidney disease: Secondary | ICD-10-CM | POA: Diagnosis not present

## 2015-11-22 DIAGNOSIS — E1129 Type 2 diabetes mellitus with other diabetic kidney complication: Secondary | ICD-10-CM | POA: Diagnosis not present

## 2015-11-25 DIAGNOSIS — D631 Anemia in chronic kidney disease: Secondary | ICD-10-CM | POA: Diagnosis not present

## 2015-11-25 DIAGNOSIS — N186 End stage renal disease: Secondary | ICD-10-CM | POA: Diagnosis not present

## 2015-11-25 DIAGNOSIS — E1129 Type 2 diabetes mellitus with other diabetic kidney complication: Secondary | ICD-10-CM | POA: Diagnosis not present

## 2015-11-25 DIAGNOSIS — N2581 Secondary hyperparathyroidism of renal origin: Secondary | ICD-10-CM | POA: Diagnosis not present

## 2015-11-27 DIAGNOSIS — E1129 Type 2 diabetes mellitus with other diabetic kidney complication: Secondary | ICD-10-CM | POA: Diagnosis not present

## 2015-11-27 DIAGNOSIS — N186 End stage renal disease: Secondary | ICD-10-CM | POA: Diagnosis not present

## 2015-11-27 DIAGNOSIS — N2581 Secondary hyperparathyroidism of renal origin: Secondary | ICD-10-CM | POA: Diagnosis not present

## 2015-11-27 DIAGNOSIS — D631 Anemia in chronic kidney disease: Secondary | ICD-10-CM | POA: Diagnosis not present

## 2015-11-29 DIAGNOSIS — N186 End stage renal disease: Secondary | ICD-10-CM | POA: Diagnosis not present

## 2015-11-29 DIAGNOSIS — D631 Anemia in chronic kidney disease: Secondary | ICD-10-CM | POA: Diagnosis not present

## 2015-11-29 DIAGNOSIS — N2581 Secondary hyperparathyroidism of renal origin: Secondary | ICD-10-CM | POA: Diagnosis not present

## 2015-11-29 DIAGNOSIS — E1129 Type 2 diabetes mellitus with other diabetic kidney complication: Secondary | ICD-10-CM | POA: Diagnosis not present

## 2015-12-02 DIAGNOSIS — N2581 Secondary hyperparathyroidism of renal origin: Secondary | ICD-10-CM | POA: Diagnosis not present

## 2015-12-02 DIAGNOSIS — D631 Anemia in chronic kidney disease: Secondary | ICD-10-CM | POA: Diagnosis not present

## 2015-12-02 DIAGNOSIS — E1129 Type 2 diabetes mellitus with other diabetic kidney complication: Secondary | ICD-10-CM | POA: Diagnosis not present

## 2015-12-02 DIAGNOSIS — N186 End stage renal disease: Secondary | ICD-10-CM | POA: Diagnosis not present

## 2015-12-04 DIAGNOSIS — D631 Anemia in chronic kidney disease: Secondary | ICD-10-CM | POA: Diagnosis not present

## 2015-12-04 DIAGNOSIS — E1129 Type 2 diabetes mellitus with other diabetic kidney complication: Secondary | ICD-10-CM | POA: Diagnosis not present

## 2015-12-04 DIAGNOSIS — N186 End stage renal disease: Secondary | ICD-10-CM | POA: Diagnosis not present

## 2015-12-04 DIAGNOSIS — N2581 Secondary hyperparathyroidism of renal origin: Secondary | ICD-10-CM | POA: Diagnosis not present

## 2015-12-06 DIAGNOSIS — N2581 Secondary hyperparathyroidism of renal origin: Secondary | ICD-10-CM | POA: Diagnosis not present

## 2015-12-06 DIAGNOSIS — D631 Anemia in chronic kidney disease: Secondary | ICD-10-CM | POA: Diagnosis not present

## 2015-12-06 DIAGNOSIS — N186 End stage renal disease: Secondary | ICD-10-CM | POA: Diagnosis not present

## 2015-12-06 DIAGNOSIS — E1129 Type 2 diabetes mellitus with other diabetic kidney complication: Secondary | ICD-10-CM | POA: Diagnosis not present

## 2015-12-08 DIAGNOSIS — E1129 Type 2 diabetes mellitus with other diabetic kidney complication: Secondary | ICD-10-CM | POA: Diagnosis not present

## 2015-12-08 DIAGNOSIS — N186 End stage renal disease: Secondary | ICD-10-CM | POA: Diagnosis not present

## 2015-12-08 DIAGNOSIS — Z992 Dependence on renal dialysis: Secondary | ICD-10-CM | POA: Diagnosis not present

## 2015-12-09 DIAGNOSIS — N2581 Secondary hyperparathyroidism of renal origin: Secondary | ICD-10-CM | POA: Diagnosis not present

## 2015-12-09 DIAGNOSIS — N186 End stage renal disease: Secondary | ICD-10-CM | POA: Diagnosis not present

## 2015-12-09 DIAGNOSIS — E1129 Type 2 diabetes mellitus with other diabetic kidney complication: Secondary | ICD-10-CM | POA: Diagnosis not present

## 2015-12-09 DIAGNOSIS — D509 Iron deficiency anemia, unspecified: Secondary | ICD-10-CM | POA: Diagnosis not present

## 2015-12-09 DIAGNOSIS — D631 Anemia in chronic kidney disease: Secondary | ICD-10-CM | POA: Diagnosis not present

## 2015-12-09 DIAGNOSIS — H1033 Unspecified acute conjunctivitis, bilateral: Secondary | ICD-10-CM | POA: Diagnosis not present

## 2015-12-09 DIAGNOSIS — J329 Chronic sinusitis, unspecified: Secondary | ICD-10-CM | POA: Diagnosis not present

## 2015-12-09 DIAGNOSIS — J309 Allergic rhinitis, unspecified: Secondary | ICD-10-CM | POA: Diagnosis not present

## 2015-12-11 DIAGNOSIS — D509 Iron deficiency anemia, unspecified: Secondary | ICD-10-CM | POA: Diagnosis not present

## 2015-12-11 DIAGNOSIS — E1129 Type 2 diabetes mellitus with other diabetic kidney complication: Secondary | ICD-10-CM | POA: Diagnosis not present

## 2015-12-11 DIAGNOSIS — N186 End stage renal disease: Secondary | ICD-10-CM | POA: Diagnosis not present

## 2015-12-11 DIAGNOSIS — D631 Anemia in chronic kidney disease: Secondary | ICD-10-CM | POA: Diagnosis not present

## 2015-12-11 DIAGNOSIS — N2581 Secondary hyperparathyroidism of renal origin: Secondary | ICD-10-CM | POA: Diagnosis not present

## 2015-12-13 DIAGNOSIS — N186 End stage renal disease: Secondary | ICD-10-CM | POA: Diagnosis not present

## 2015-12-13 DIAGNOSIS — D509 Iron deficiency anemia, unspecified: Secondary | ICD-10-CM | POA: Diagnosis not present

## 2015-12-13 DIAGNOSIS — N2581 Secondary hyperparathyroidism of renal origin: Secondary | ICD-10-CM | POA: Diagnosis not present

## 2015-12-13 DIAGNOSIS — D631 Anemia in chronic kidney disease: Secondary | ICD-10-CM | POA: Diagnosis not present

## 2015-12-13 DIAGNOSIS — E1129 Type 2 diabetes mellitus with other diabetic kidney complication: Secondary | ICD-10-CM | POA: Diagnosis not present

## 2015-12-16 DIAGNOSIS — E1129 Type 2 diabetes mellitus with other diabetic kidney complication: Secondary | ICD-10-CM | POA: Diagnosis not present

## 2015-12-16 DIAGNOSIS — N186 End stage renal disease: Secondary | ICD-10-CM | POA: Diagnosis not present

## 2015-12-16 DIAGNOSIS — D509 Iron deficiency anemia, unspecified: Secondary | ICD-10-CM | POA: Diagnosis not present

## 2015-12-16 DIAGNOSIS — N2581 Secondary hyperparathyroidism of renal origin: Secondary | ICD-10-CM | POA: Diagnosis not present

## 2015-12-16 DIAGNOSIS — D631 Anemia in chronic kidney disease: Secondary | ICD-10-CM | POA: Diagnosis not present

## 2015-12-18 DIAGNOSIS — E1129 Type 2 diabetes mellitus with other diabetic kidney complication: Secondary | ICD-10-CM | POA: Diagnosis not present

## 2015-12-18 DIAGNOSIS — N186 End stage renal disease: Secondary | ICD-10-CM | POA: Diagnosis not present

## 2015-12-18 DIAGNOSIS — N2581 Secondary hyperparathyroidism of renal origin: Secondary | ICD-10-CM | POA: Diagnosis not present

## 2015-12-18 DIAGNOSIS — D509 Iron deficiency anemia, unspecified: Secondary | ICD-10-CM | POA: Diagnosis not present

## 2015-12-18 DIAGNOSIS — D631 Anemia in chronic kidney disease: Secondary | ICD-10-CM | POA: Diagnosis not present

## 2015-12-20 DIAGNOSIS — E1129 Type 2 diabetes mellitus with other diabetic kidney complication: Secondary | ICD-10-CM | POA: Diagnosis not present

## 2015-12-20 DIAGNOSIS — N186 End stage renal disease: Secondary | ICD-10-CM | POA: Diagnosis not present

## 2015-12-20 DIAGNOSIS — N2581 Secondary hyperparathyroidism of renal origin: Secondary | ICD-10-CM | POA: Diagnosis not present

## 2015-12-20 DIAGNOSIS — D631 Anemia in chronic kidney disease: Secondary | ICD-10-CM | POA: Diagnosis not present

## 2015-12-20 DIAGNOSIS — D509 Iron deficiency anemia, unspecified: Secondary | ICD-10-CM | POA: Diagnosis not present

## 2015-12-23 DIAGNOSIS — N186 End stage renal disease: Secondary | ICD-10-CM | POA: Diagnosis not present

## 2015-12-23 DIAGNOSIS — N2581 Secondary hyperparathyroidism of renal origin: Secondary | ICD-10-CM | POA: Diagnosis not present

## 2015-12-23 DIAGNOSIS — D509 Iron deficiency anemia, unspecified: Secondary | ICD-10-CM | POA: Diagnosis not present

## 2015-12-23 DIAGNOSIS — D631 Anemia in chronic kidney disease: Secondary | ICD-10-CM | POA: Diagnosis not present

## 2015-12-23 DIAGNOSIS — E1129 Type 2 diabetes mellitus with other diabetic kidney complication: Secondary | ICD-10-CM | POA: Diagnosis not present

## 2015-12-25 DIAGNOSIS — N2581 Secondary hyperparathyroidism of renal origin: Secondary | ICD-10-CM | POA: Diagnosis not present

## 2015-12-25 DIAGNOSIS — D631 Anemia in chronic kidney disease: Secondary | ICD-10-CM | POA: Diagnosis not present

## 2015-12-25 DIAGNOSIS — N186 End stage renal disease: Secondary | ICD-10-CM | POA: Diagnosis not present

## 2015-12-25 DIAGNOSIS — E1129 Type 2 diabetes mellitus with other diabetic kidney complication: Secondary | ICD-10-CM | POA: Diagnosis not present

## 2015-12-25 DIAGNOSIS — D509 Iron deficiency anemia, unspecified: Secondary | ICD-10-CM | POA: Diagnosis not present

## 2015-12-27 DIAGNOSIS — N2581 Secondary hyperparathyroidism of renal origin: Secondary | ICD-10-CM | POA: Diagnosis not present

## 2015-12-27 DIAGNOSIS — E1129 Type 2 diabetes mellitus with other diabetic kidney complication: Secondary | ICD-10-CM | POA: Diagnosis not present

## 2015-12-27 DIAGNOSIS — D631 Anemia in chronic kidney disease: Secondary | ICD-10-CM | POA: Diagnosis not present

## 2015-12-27 DIAGNOSIS — N186 End stage renal disease: Secondary | ICD-10-CM | POA: Diagnosis not present

## 2015-12-27 DIAGNOSIS — D509 Iron deficiency anemia, unspecified: Secondary | ICD-10-CM | POA: Diagnosis not present

## 2015-12-30 DIAGNOSIS — N186 End stage renal disease: Secondary | ICD-10-CM | POA: Diagnosis not present

## 2015-12-30 DIAGNOSIS — D631 Anemia in chronic kidney disease: Secondary | ICD-10-CM | POA: Diagnosis not present

## 2015-12-30 DIAGNOSIS — N2581 Secondary hyperparathyroidism of renal origin: Secondary | ICD-10-CM | POA: Diagnosis not present

## 2015-12-30 DIAGNOSIS — D509 Iron deficiency anemia, unspecified: Secondary | ICD-10-CM | POA: Diagnosis not present

## 2015-12-30 DIAGNOSIS — E1129 Type 2 diabetes mellitus with other diabetic kidney complication: Secondary | ICD-10-CM | POA: Diagnosis not present

## 2016-01-01 DIAGNOSIS — N2581 Secondary hyperparathyroidism of renal origin: Secondary | ICD-10-CM | POA: Diagnosis not present

## 2016-01-01 DIAGNOSIS — N186 End stage renal disease: Secondary | ICD-10-CM | POA: Diagnosis not present

## 2016-01-01 DIAGNOSIS — D631 Anemia in chronic kidney disease: Secondary | ICD-10-CM | POA: Diagnosis not present

## 2016-01-01 DIAGNOSIS — D509 Iron deficiency anemia, unspecified: Secondary | ICD-10-CM | POA: Diagnosis not present

## 2016-01-01 DIAGNOSIS — E1129 Type 2 diabetes mellitus with other diabetic kidney complication: Secondary | ICD-10-CM | POA: Diagnosis not present

## 2016-01-03 DIAGNOSIS — N2581 Secondary hyperparathyroidism of renal origin: Secondary | ICD-10-CM | POA: Diagnosis not present

## 2016-01-03 DIAGNOSIS — D509 Iron deficiency anemia, unspecified: Secondary | ICD-10-CM | POA: Diagnosis not present

## 2016-01-03 DIAGNOSIS — D631 Anemia in chronic kidney disease: Secondary | ICD-10-CM | POA: Diagnosis not present

## 2016-01-03 DIAGNOSIS — N186 End stage renal disease: Secondary | ICD-10-CM | POA: Diagnosis not present

## 2016-01-03 DIAGNOSIS — E1129 Type 2 diabetes mellitus with other diabetic kidney complication: Secondary | ICD-10-CM | POA: Diagnosis not present

## 2016-01-06 DIAGNOSIS — E1129 Type 2 diabetes mellitus with other diabetic kidney complication: Secondary | ICD-10-CM | POA: Diagnosis not present

## 2016-01-06 DIAGNOSIS — N186 End stage renal disease: Secondary | ICD-10-CM | POA: Diagnosis not present

## 2016-01-06 DIAGNOSIS — D509 Iron deficiency anemia, unspecified: Secondary | ICD-10-CM | POA: Diagnosis not present

## 2016-01-06 DIAGNOSIS — D631 Anemia in chronic kidney disease: Secondary | ICD-10-CM | POA: Diagnosis not present

## 2016-01-06 DIAGNOSIS — N2581 Secondary hyperparathyroidism of renal origin: Secondary | ICD-10-CM | POA: Diagnosis not present

## 2016-01-08 DIAGNOSIS — N186 End stage renal disease: Secondary | ICD-10-CM | POA: Diagnosis not present

## 2016-01-08 DIAGNOSIS — N2581 Secondary hyperparathyroidism of renal origin: Secondary | ICD-10-CM | POA: Diagnosis not present

## 2016-01-08 DIAGNOSIS — D631 Anemia in chronic kidney disease: Secondary | ICD-10-CM | POA: Diagnosis not present

## 2016-01-08 DIAGNOSIS — Z992 Dependence on renal dialysis: Secondary | ICD-10-CM | POA: Diagnosis not present

## 2016-01-08 DIAGNOSIS — D509 Iron deficiency anemia, unspecified: Secondary | ICD-10-CM | POA: Diagnosis not present

## 2016-01-08 DIAGNOSIS — E1129 Type 2 diabetes mellitus with other diabetic kidney complication: Secondary | ICD-10-CM | POA: Diagnosis not present

## 2016-01-10 DIAGNOSIS — Z23 Encounter for immunization: Secondary | ICD-10-CM | POA: Diagnosis not present

## 2016-01-10 DIAGNOSIS — N2581 Secondary hyperparathyroidism of renal origin: Secondary | ICD-10-CM | POA: Diagnosis not present

## 2016-01-10 DIAGNOSIS — N186 End stage renal disease: Secondary | ICD-10-CM | POA: Diagnosis not present

## 2016-01-10 DIAGNOSIS — E1129 Type 2 diabetes mellitus with other diabetic kidney complication: Secondary | ICD-10-CM | POA: Diagnosis not present

## 2016-01-10 DIAGNOSIS — D631 Anemia in chronic kidney disease: Secondary | ICD-10-CM | POA: Diagnosis not present

## 2016-01-13 DIAGNOSIS — N186 End stage renal disease: Secondary | ICD-10-CM | POA: Diagnosis not present

## 2016-01-13 DIAGNOSIS — Z23 Encounter for immunization: Secondary | ICD-10-CM | POA: Diagnosis not present

## 2016-01-13 DIAGNOSIS — N2581 Secondary hyperparathyroidism of renal origin: Secondary | ICD-10-CM | POA: Diagnosis not present

## 2016-01-13 DIAGNOSIS — E1129 Type 2 diabetes mellitus with other diabetic kidney complication: Secondary | ICD-10-CM | POA: Diagnosis not present

## 2016-01-13 DIAGNOSIS — D631 Anemia in chronic kidney disease: Secondary | ICD-10-CM | POA: Diagnosis not present

## 2016-01-15 DIAGNOSIS — D631 Anemia in chronic kidney disease: Secondary | ICD-10-CM | POA: Diagnosis not present

## 2016-01-15 DIAGNOSIS — N186 End stage renal disease: Secondary | ICD-10-CM | POA: Diagnosis not present

## 2016-01-15 DIAGNOSIS — Z23 Encounter for immunization: Secondary | ICD-10-CM | POA: Diagnosis not present

## 2016-01-15 DIAGNOSIS — N2581 Secondary hyperparathyroidism of renal origin: Secondary | ICD-10-CM | POA: Diagnosis not present

## 2016-01-15 DIAGNOSIS — E1129 Type 2 diabetes mellitus with other diabetic kidney complication: Secondary | ICD-10-CM | POA: Diagnosis not present

## 2016-01-17 DIAGNOSIS — E1129 Type 2 diabetes mellitus with other diabetic kidney complication: Secondary | ICD-10-CM | POA: Diagnosis not present

## 2016-01-17 DIAGNOSIS — N186 End stage renal disease: Secondary | ICD-10-CM | POA: Diagnosis not present

## 2016-01-17 DIAGNOSIS — Z23 Encounter for immunization: Secondary | ICD-10-CM | POA: Diagnosis not present

## 2016-01-17 DIAGNOSIS — N2581 Secondary hyperparathyroidism of renal origin: Secondary | ICD-10-CM | POA: Diagnosis not present

## 2016-01-17 DIAGNOSIS — D631 Anemia in chronic kidney disease: Secondary | ICD-10-CM | POA: Diagnosis not present

## 2016-01-20 DIAGNOSIS — D631 Anemia in chronic kidney disease: Secondary | ICD-10-CM | POA: Diagnosis not present

## 2016-01-20 DIAGNOSIS — N2581 Secondary hyperparathyroidism of renal origin: Secondary | ICD-10-CM | POA: Diagnosis not present

## 2016-01-20 DIAGNOSIS — E1129 Type 2 diabetes mellitus with other diabetic kidney complication: Secondary | ICD-10-CM | POA: Diagnosis not present

## 2016-01-20 DIAGNOSIS — Z23 Encounter for immunization: Secondary | ICD-10-CM | POA: Diagnosis not present

## 2016-01-20 DIAGNOSIS — N186 End stage renal disease: Secondary | ICD-10-CM | POA: Diagnosis not present

## 2016-01-22 DIAGNOSIS — D631 Anemia in chronic kidney disease: Secondary | ICD-10-CM | POA: Diagnosis not present

## 2016-01-22 DIAGNOSIS — N2581 Secondary hyperparathyroidism of renal origin: Secondary | ICD-10-CM | POA: Diagnosis not present

## 2016-01-22 DIAGNOSIS — E1129 Type 2 diabetes mellitus with other diabetic kidney complication: Secondary | ICD-10-CM | POA: Diagnosis not present

## 2016-01-22 DIAGNOSIS — Z23 Encounter for immunization: Secondary | ICD-10-CM | POA: Diagnosis not present

## 2016-01-22 DIAGNOSIS — N186 End stage renal disease: Secondary | ICD-10-CM | POA: Diagnosis not present

## 2016-01-24 DIAGNOSIS — N2581 Secondary hyperparathyroidism of renal origin: Secondary | ICD-10-CM | POA: Diagnosis not present

## 2016-01-24 DIAGNOSIS — N186 End stage renal disease: Secondary | ICD-10-CM | POA: Diagnosis not present

## 2016-01-24 DIAGNOSIS — E1129 Type 2 diabetes mellitus with other diabetic kidney complication: Secondary | ICD-10-CM | POA: Diagnosis not present

## 2016-01-24 DIAGNOSIS — Z23 Encounter for immunization: Secondary | ICD-10-CM | POA: Diagnosis not present

## 2016-01-24 DIAGNOSIS — D631 Anemia in chronic kidney disease: Secondary | ICD-10-CM | POA: Diagnosis not present

## 2016-01-27 DIAGNOSIS — N2581 Secondary hyperparathyroidism of renal origin: Secondary | ICD-10-CM | POA: Diagnosis not present

## 2016-01-27 DIAGNOSIS — Z23 Encounter for immunization: Secondary | ICD-10-CM | POA: Diagnosis not present

## 2016-01-27 DIAGNOSIS — N186 End stage renal disease: Secondary | ICD-10-CM | POA: Diagnosis not present

## 2016-01-27 DIAGNOSIS — D631 Anemia in chronic kidney disease: Secondary | ICD-10-CM | POA: Diagnosis not present

## 2016-01-27 DIAGNOSIS — E1129 Type 2 diabetes mellitus with other diabetic kidney complication: Secondary | ICD-10-CM | POA: Diagnosis not present

## 2016-01-29 DIAGNOSIS — Z23 Encounter for immunization: Secondary | ICD-10-CM | POA: Diagnosis not present

## 2016-01-29 DIAGNOSIS — N2581 Secondary hyperparathyroidism of renal origin: Secondary | ICD-10-CM | POA: Diagnosis not present

## 2016-01-29 DIAGNOSIS — N186 End stage renal disease: Secondary | ICD-10-CM | POA: Diagnosis not present

## 2016-01-29 DIAGNOSIS — D631 Anemia in chronic kidney disease: Secondary | ICD-10-CM | POA: Diagnosis not present

## 2016-01-29 DIAGNOSIS — E1129 Type 2 diabetes mellitus with other diabetic kidney complication: Secondary | ICD-10-CM | POA: Diagnosis not present

## 2016-01-30 ENCOUNTER — Ambulatory Visit: Payer: Medicare Other | Admitting: Podiatry

## 2016-01-31 DIAGNOSIS — N2581 Secondary hyperparathyroidism of renal origin: Secondary | ICD-10-CM | POA: Diagnosis not present

## 2016-01-31 DIAGNOSIS — D631 Anemia in chronic kidney disease: Secondary | ICD-10-CM | POA: Diagnosis not present

## 2016-01-31 DIAGNOSIS — E1129 Type 2 diabetes mellitus with other diabetic kidney complication: Secondary | ICD-10-CM | POA: Diagnosis not present

## 2016-01-31 DIAGNOSIS — N186 End stage renal disease: Secondary | ICD-10-CM | POA: Diagnosis not present

## 2016-01-31 DIAGNOSIS — Z23 Encounter for immunization: Secondary | ICD-10-CM | POA: Diagnosis not present

## 2016-02-03 DIAGNOSIS — N2581 Secondary hyperparathyroidism of renal origin: Secondary | ICD-10-CM | POA: Diagnosis not present

## 2016-02-03 DIAGNOSIS — D631 Anemia in chronic kidney disease: Secondary | ICD-10-CM | POA: Diagnosis not present

## 2016-02-03 DIAGNOSIS — N186 End stage renal disease: Secondary | ICD-10-CM | POA: Diagnosis not present

## 2016-02-03 DIAGNOSIS — E1129 Type 2 diabetes mellitus with other diabetic kidney complication: Secondary | ICD-10-CM | POA: Diagnosis not present

## 2016-02-03 DIAGNOSIS — Z23 Encounter for immunization: Secondary | ICD-10-CM | POA: Diagnosis not present

## 2016-02-05 DIAGNOSIS — N186 End stage renal disease: Secondary | ICD-10-CM | POA: Diagnosis not present

## 2016-02-05 DIAGNOSIS — Z23 Encounter for immunization: Secondary | ICD-10-CM | POA: Diagnosis not present

## 2016-02-05 DIAGNOSIS — E1129 Type 2 diabetes mellitus with other diabetic kidney complication: Secondary | ICD-10-CM | POA: Diagnosis not present

## 2016-02-05 DIAGNOSIS — N2581 Secondary hyperparathyroidism of renal origin: Secondary | ICD-10-CM | POA: Diagnosis not present

## 2016-02-05 DIAGNOSIS — D631 Anemia in chronic kidney disease: Secondary | ICD-10-CM | POA: Diagnosis not present

## 2016-02-07 DIAGNOSIS — N2581 Secondary hyperparathyroidism of renal origin: Secondary | ICD-10-CM | POA: Diagnosis not present

## 2016-02-07 DIAGNOSIS — N186 End stage renal disease: Secondary | ICD-10-CM | POA: Diagnosis not present

## 2016-02-07 DIAGNOSIS — Z992 Dependence on renal dialysis: Secondary | ICD-10-CM | POA: Diagnosis not present

## 2016-02-07 DIAGNOSIS — D631 Anemia in chronic kidney disease: Secondary | ICD-10-CM | POA: Diagnosis not present

## 2016-02-07 DIAGNOSIS — Z23 Encounter for immunization: Secondary | ICD-10-CM | POA: Diagnosis not present

## 2016-02-07 DIAGNOSIS — E1129 Type 2 diabetes mellitus with other diabetic kidney complication: Secondary | ICD-10-CM | POA: Diagnosis not present

## 2016-02-10 DIAGNOSIS — N186 End stage renal disease: Secondary | ICD-10-CM | POA: Diagnosis not present

## 2016-02-10 DIAGNOSIS — D631 Anemia in chronic kidney disease: Secondary | ICD-10-CM | POA: Diagnosis not present

## 2016-02-10 DIAGNOSIS — E1129 Type 2 diabetes mellitus with other diabetic kidney complication: Secondary | ICD-10-CM | POA: Diagnosis not present

## 2016-02-10 DIAGNOSIS — N2581 Secondary hyperparathyroidism of renal origin: Secondary | ICD-10-CM | POA: Diagnosis not present

## 2016-02-12 DIAGNOSIS — N186 End stage renal disease: Secondary | ICD-10-CM | POA: Diagnosis not present

## 2016-02-12 DIAGNOSIS — D631 Anemia in chronic kidney disease: Secondary | ICD-10-CM | POA: Diagnosis not present

## 2016-02-12 DIAGNOSIS — N2581 Secondary hyperparathyroidism of renal origin: Secondary | ICD-10-CM | POA: Diagnosis not present

## 2016-02-12 DIAGNOSIS — E1129 Type 2 diabetes mellitus with other diabetic kidney complication: Secondary | ICD-10-CM | POA: Diagnosis not present

## 2016-02-13 ENCOUNTER — Ambulatory Visit (INDEPENDENT_AMBULATORY_CARE_PROVIDER_SITE_OTHER): Payer: Medicare Other | Admitting: Podiatry

## 2016-02-13 ENCOUNTER — Encounter: Payer: Self-pay | Admitting: Podiatry

## 2016-02-13 DIAGNOSIS — M79676 Pain in unspecified toe(s): Secondary | ICD-10-CM

## 2016-02-13 DIAGNOSIS — B351 Tinea unguium: Secondary | ICD-10-CM

## 2016-02-13 DIAGNOSIS — L84 Corns and callosities: Secondary | ICD-10-CM

## 2016-02-13 DIAGNOSIS — E1151 Type 2 diabetes mellitus with diabetic peripheral angiopathy without gangrene: Secondary | ICD-10-CM

## 2016-02-13 DIAGNOSIS — I739 Peripheral vascular disease, unspecified: Secondary | ICD-10-CM

## 2016-02-13 NOTE — Progress Notes (Signed)
Patient ID: [name], female   DOB: 03/08/1933, 82 y.o.   MRN: [id number]  Subjective: 83 y.o. returns the office today for painful, elongated, thickened toenails and calluses which she cannot trim herself. She also has callus and cons to her feet. Denies any redness or drainage around the nails/calluses. Denies any acute changes since last appointment and no new complaints today. Denies any systemic complaints such as fevers, chills, nausea, vomiting.   Objective: AAO 3, NAD DP/PT pulses decreased Protective sensation decreased with Simms Weinstein monofilament Nails hypertrophic, dystrophic, elongated, brittle, discolored 9. There is tenderness overlying the nails 2-5 on the left and 1-5 on the right. There is no surrounding erythema or drainage along the nail sites. Hyperkeratotic lesions bilateral foot submetatarsal 1 and 5 and lateral 5th digit on the left and right foot. Upon debridement there was no underlying ulceration, drainage or other clinical signs of infection. No open lesions or pre-ulcerative lesions are identified. No other areas of tenderness bilateral lower extremities. No overlying edema, erythema, increased warmth. Previous partial auto-amputation of the left hallux which is healed at this time. No pain with calf compression, swelling, warmth, erythema.  Assessment: Patient presents with symptomatic onychomycosis; pre-ulcerative calluses    Plan: -Treatment options including alternatives, risks, complications were discussed -Nails sharply debrided 9 without complication/bleeding. -Hyperkeratotic lesion sharply debrided 4 without complications/bleeding -Discussed daily foot inspection. If there are any changes, to call the office immediately.  -Follow-up in 3 months or sooner if any problems are to arise. In the meantime, encouraged to call the office with any questions, concerns, changes symptoms.  Matthew Wagoner, DPM  

## 2016-02-14 DIAGNOSIS — E1129 Type 2 diabetes mellitus with other diabetic kidney complication: Secondary | ICD-10-CM | POA: Diagnosis not present

## 2016-02-14 DIAGNOSIS — N186 End stage renal disease: Secondary | ICD-10-CM | POA: Diagnosis not present

## 2016-02-14 DIAGNOSIS — N2581 Secondary hyperparathyroidism of renal origin: Secondary | ICD-10-CM | POA: Diagnosis not present

## 2016-02-14 DIAGNOSIS — D631 Anemia in chronic kidney disease: Secondary | ICD-10-CM | POA: Diagnosis not present

## 2016-02-17 DIAGNOSIS — N186 End stage renal disease: Secondary | ICD-10-CM | POA: Diagnosis not present

## 2016-02-17 DIAGNOSIS — E1129 Type 2 diabetes mellitus with other diabetic kidney complication: Secondary | ICD-10-CM | POA: Diagnosis not present

## 2016-02-17 DIAGNOSIS — N2581 Secondary hyperparathyroidism of renal origin: Secondary | ICD-10-CM | POA: Diagnosis not present

## 2016-02-17 DIAGNOSIS — D631 Anemia in chronic kidney disease: Secondary | ICD-10-CM | POA: Diagnosis not present

## 2016-02-19 DIAGNOSIS — D631 Anemia in chronic kidney disease: Secondary | ICD-10-CM | POA: Diagnosis not present

## 2016-02-19 DIAGNOSIS — N186 End stage renal disease: Secondary | ICD-10-CM | POA: Diagnosis not present

## 2016-02-19 DIAGNOSIS — N2581 Secondary hyperparathyroidism of renal origin: Secondary | ICD-10-CM | POA: Diagnosis not present

## 2016-02-19 DIAGNOSIS — E1129 Type 2 diabetes mellitus with other diabetic kidney complication: Secondary | ICD-10-CM | POA: Diagnosis not present

## 2016-02-21 DIAGNOSIS — N2581 Secondary hyperparathyroidism of renal origin: Secondary | ICD-10-CM | POA: Diagnosis not present

## 2016-02-21 DIAGNOSIS — N186 End stage renal disease: Secondary | ICD-10-CM | POA: Diagnosis not present

## 2016-02-21 DIAGNOSIS — D631 Anemia in chronic kidney disease: Secondary | ICD-10-CM | POA: Diagnosis not present

## 2016-02-21 DIAGNOSIS — E1129 Type 2 diabetes mellitus with other diabetic kidney complication: Secondary | ICD-10-CM | POA: Diagnosis not present

## 2016-02-24 DIAGNOSIS — N186 End stage renal disease: Secondary | ICD-10-CM | POA: Diagnosis not present

## 2016-02-24 DIAGNOSIS — N2581 Secondary hyperparathyroidism of renal origin: Secondary | ICD-10-CM | POA: Diagnosis not present

## 2016-02-24 DIAGNOSIS — D631 Anemia in chronic kidney disease: Secondary | ICD-10-CM | POA: Diagnosis not present

## 2016-02-24 DIAGNOSIS — E1129 Type 2 diabetes mellitus with other diabetic kidney complication: Secondary | ICD-10-CM | POA: Diagnosis not present

## 2016-02-26 DIAGNOSIS — D631 Anemia in chronic kidney disease: Secondary | ICD-10-CM | POA: Diagnosis not present

## 2016-02-26 DIAGNOSIS — E1129 Type 2 diabetes mellitus with other diabetic kidney complication: Secondary | ICD-10-CM | POA: Diagnosis not present

## 2016-02-26 DIAGNOSIS — N2581 Secondary hyperparathyroidism of renal origin: Secondary | ICD-10-CM | POA: Diagnosis not present

## 2016-02-26 DIAGNOSIS — N186 End stage renal disease: Secondary | ICD-10-CM | POA: Diagnosis not present

## 2016-02-27 ENCOUNTER — Other Ambulatory Visit: Payer: Self-pay | Admitting: Family Medicine

## 2016-02-27 DIAGNOSIS — Z1231 Encounter for screening mammogram for malignant neoplasm of breast: Secondary | ICD-10-CM

## 2016-02-28 DIAGNOSIS — E1129 Type 2 diabetes mellitus with other diabetic kidney complication: Secondary | ICD-10-CM | POA: Diagnosis not present

## 2016-02-28 DIAGNOSIS — D631 Anemia in chronic kidney disease: Secondary | ICD-10-CM | POA: Diagnosis not present

## 2016-02-28 DIAGNOSIS — N2581 Secondary hyperparathyroidism of renal origin: Secondary | ICD-10-CM | POA: Diagnosis not present

## 2016-02-28 DIAGNOSIS — N186 End stage renal disease: Secondary | ICD-10-CM | POA: Diagnosis not present

## 2016-03-01 ENCOUNTER — Ambulatory Visit
Admission: RE | Admit: 2016-03-01 | Discharge: 2016-03-01 | Disposition: A | Discharge status: Home / Self Care | Payer: Medicare Other | Source: Ambulatory Visit | Attending: Family Medicine | Admitting: Family Medicine

## 2016-03-01 DIAGNOSIS — Z1231 Encounter for screening mammogram for malignant neoplasm of breast: Secondary | ICD-10-CM

## 2016-03-02 DIAGNOSIS — N186 End stage renal disease: Secondary | ICD-10-CM | POA: Diagnosis not present

## 2016-03-02 DIAGNOSIS — E1129 Type 2 diabetes mellitus with other diabetic kidney complication: Secondary | ICD-10-CM | POA: Diagnosis not present

## 2016-03-02 DIAGNOSIS — D631 Anemia in chronic kidney disease: Secondary | ICD-10-CM | POA: Diagnosis not present

## 2016-03-02 DIAGNOSIS — N2581 Secondary hyperparathyroidism of renal origin: Secondary | ICD-10-CM | POA: Diagnosis not present

## 2016-03-04 DIAGNOSIS — N186 End stage renal disease: Secondary | ICD-10-CM | POA: Diagnosis not present

## 2016-03-04 DIAGNOSIS — N2581 Secondary hyperparathyroidism of renal origin: Secondary | ICD-10-CM | POA: Diagnosis not present

## 2016-03-04 DIAGNOSIS — D631 Anemia in chronic kidney disease: Secondary | ICD-10-CM | POA: Diagnosis not present

## 2016-03-04 DIAGNOSIS — E1129 Type 2 diabetes mellitus with other diabetic kidney complication: Secondary | ICD-10-CM | POA: Diagnosis not present

## 2016-03-06 DIAGNOSIS — N2581 Secondary hyperparathyroidism of renal origin: Secondary | ICD-10-CM | POA: Diagnosis not present

## 2016-03-06 DIAGNOSIS — E1129 Type 2 diabetes mellitus with other diabetic kidney complication: Secondary | ICD-10-CM | POA: Diagnosis not present

## 2016-03-06 DIAGNOSIS — D631 Anemia in chronic kidney disease: Secondary | ICD-10-CM | POA: Diagnosis not present

## 2016-03-06 DIAGNOSIS — N186 End stage renal disease: Secondary | ICD-10-CM | POA: Diagnosis not present

## 2016-03-09 DIAGNOSIS — E1129 Type 2 diabetes mellitus with other diabetic kidney complication: Secondary | ICD-10-CM | POA: Diagnosis not present

## 2016-03-09 DIAGNOSIS — N186 End stage renal disease: Secondary | ICD-10-CM | POA: Diagnosis not present

## 2016-03-09 DIAGNOSIS — D631 Anemia in chronic kidney disease: Secondary | ICD-10-CM | POA: Diagnosis not present

## 2016-03-09 DIAGNOSIS — N2581 Secondary hyperparathyroidism of renal origin: Secondary | ICD-10-CM | POA: Diagnosis not present

## 2016-03-09 DIAGNOSIS — Z992 Dependence on renal dialysis: Secondary | ICD-10-CM | POA: Diagnosis not present

## 2016-03-11 DIAGNOSIS — N186 End stage renal disease: Secondary | ICD-10-CM | POA: Diagnosis not present

## 2016-03-11 DIAGNOSIS — D631 Anemia in chronic kidney disease: Secondary | ICD-10-CM | POA: Diagnosis not present

## 2016-03-11 DIAGNOSIS — E1129 Type 2 diabetes mellitus with other diabetic kidney complication: Secondary | ICD-10-CM | POA: Diagnosis not present

## 2016-03-11 DIAGNOSIS — N2581 Secondary hyperparathyroidism of renal origin: Secondary | ICD-10-CM | POA: Diagnosis not present

## 2016-03-13 DIAGNOSIS — D631 Anemia in chronic kidney disease: Secondary | ICD-10-CM | POA: Diagnosis not present

## 2016-03-13 DIAGNOSIS — N186 End stage renal disease: Secondary | ICD-10-CM | POA: Diagnosis not present

## 2016-03-13 DIAGNOSIS — N2581 Secondary hyperparathyroidism of renal origin: Secondary | ICD-10-CM | POA: Diagnosis not present

## 2016-03-13 DIAGNOSIS — E1129 Type 2 diabetes mellitus with other diabetic kidney complication: Secondary | ICD-10-CM | POA: Diagnosis not present

## 2016-03-16 DIAGNOSIS — N186 End stage renal disease: Secondary | ICD-10-CM | POA: Diagnosis not present

## 2016-03-16 DIAGNOSIS — E1129 Type 2 diabetes mellitus with other diabetic kidney complication: Secondary | ICD-10-CM | POA: Diagnosis not present

## 2016-03-16 DIAGNOSIS — N2581 Secondary hyperparathyroidism of renal origin: Secondary | ICD-10-CM | POA: Diagnosis not present

## 2016-03-16 DIAGNOSIS — D631 Anemia in chronic kidney disease: Secondary | ICD-10-CM | POA: Diagnosis not present

## 2016-03-18 DIAGNOSIS — N186 End stage renal disease: Secondary | ICD-10-CM | POA: Diagnosis not present

## 2016-03-18 DIAGNOSIS — D631 Anemia in chronic kidney disease: Secondary | ICD-10-CM | POA: Diagnosis not present

## 2016-03-18 DIAGNOSIS — E1129 Type 2 diabetes mellitus with other diabetic kidney complication: Secondary | ICD-10-CM | POA: Diagnosis not present

## 2016-03-18 DIAGNOSIS — N2581 Secondary hyperparathyroidism of renal origin: Secondary | ICD-10-CM | POA: Diagnosis not present

## 2016-03-20 DIAGNOSIS — N2581 Secondary hyperparathyroidism of renal origin: Secondary | ICD-10-CM | POA: Diagnosis not present

## 2016-03-20 DIAGNOSIS — D631 Anemia in chronic kidney disease: Secondary | ICD-10-CM | POA: Diagnosis not present

## 2016-03-20 DIAGNOSIS — N186 End stage renal disease: Secondary | ICD-10-CM | POA: Diagnosis not present

## 2016-03-20 DIAGNOSIS — E1129 Type 2 diabetes mellitus with other diabetic kidney complication: Secondary | ICD-10-CM | POA: Diagnosis not present

## 2016-03-23 DIAGNOSIS — N2581 Secondary hyperparathyroidism of renal origin: Secondary | ICD-10-CM | POA: Diagnosis not present

## 2016-03-23 DIAGNOSIS — E1129 Type 2 diabetes mellitus with other diabetic kidney complication: Secondary | ICD-10-CM | POA: Diagnosis not present

## 2016-03-23 DIAGNOSIS — N186 End stage renal disease: Secondary | ICD-10-CM | POA: Diagnosis not present

## 2016-03-23 DIAGNOSIS — D631 Anemia in chronic kidney disease: Secondary | ICD-10-CM | POA: Diagnosis not present

## 2016-03-25 DIAGNOSIS — E1129 Type 2 diabetes mellitus with other diabetic kidney complication: Secondary | ICD-10-CM | POA: Diagnosis not present

## 2016-03-25 DIAGNOSIS — N186 End stage renal disease: Secondary | ICD-10-CM | POA: Diagnosis not present

## 2016-03-25 DIAGNOSIS — N2581 Secondary hyperparathyroidism of renal origin: Secondary | ICD-10-CM | POA: Diagnosis not present

## 2016-03-25 DIAGNOSIS — D631 Anemia in chronic kidney disease: Secondary | ICD-10-CM | POA: Diagnosis not present

## 2016-03-27 DIAGNOSIS — N186 End stage renal disease: Secondary | ICD-10-CM | POA: Diagnosis not present

## 2016-03-27 DIAGNOSIS — N2581 Secondary hyperparathyroidism of renal origin: Secondary | ICD-10-CM | POA: Diagnosis not present

## 2016-03-27 DIAGNOSIS — D631 Anemia in chronic kidney disease: Secondary | ICD-10-CM | POA: Diagnosis not present

## 2016-03-27 DIAGNOSIS — E1129 Type 2 diabetes mellitus with other diabetic kidney complication: Secondary | ICD-10-CM | POA: Diagnosis not present

## 2016-03-29 DIAGNOSIS — N186 End stage renal disease: Secondary | ICD-10-CM | POA: Diagnosis not present

## 2016-03-29 DIAGNOSIS — E1129 Type 2 diabetes mellitus with other diabetic kidney complication: Secondary | ICD-10-CM | POA: Diagnosis not present

## 2016-03-29 DIAGNOSIS — D631 Anemia in chronic kidney disease: Secondary | ICD-10-CM | POA: Diagnosis not present

## 2016-03-29 DIAGNOSIS — N2581 Secondary hyperparathyroidism of renal origin: Secondary | ICD-10-CM | POA: Diagnosis not present

## 2016-03-31 DIAGNOSIS — D631 Anemia in chronic kidney disease: Secondary | ICD-10-CM | POA: Diagnosis not present

## 2016-03-31 DIAGNOSIS — E1129 Type 2 diabetes mellitus with other diabetic kidney complication: Secondary | ICD-10-CM | POA: Diagnosis not present

## 2016-03-31 DIAGNOSIS — N186 End stage renal disease: Secondary | ICD-10-CM | POA: Diagnosis not present

## 2016-03-31 DIAGNOSIS — N2581 Secondary hyperparathyroidism of renal origin: Secondary | ICD-10-CM | POA: Diagnosis not present

## 2016-04-03 DIAGNOSIS — D631 Anemia in chronic kidney disease: Secondary | ICD-10-CM | POA: Diagnosis not present

## 2016-04-03 DIAGNOSIS — E1129 Type 2 diabetes mellitus with other diabetic kidney complication: Secondary | ICD-10-CM | POA: Diagnosis not present

## 2016-04-03 DIAGNOSIS — N186 End stage renal disease: Secondary | ICD-10-CM | POA: Diagnosis not present

## 2016-04-03 DIAGNOSIS — N2581 Secondary hyperparathyroidism of renal origin: Secondary | ICD-10-CM | POA: Diagnosis not present

## 2016-04-06 DIAGNOSIS — N186 End stage renal disease: Secondary | ICD-10-CM | POA: Diagnosis not present

## 2016-04-06 DIAGNOSIS — D631 Anemia in chronic kidney disease: Secondary | ICD-10-CM | POA: Diagnosis not present

## 2016-04-06 DIAGNOSIS — E1129 Type 2 diabetes mellitus with other diabetic kidney complication: Secondary | ICD-10-CM | POA: Diagnosis not present

## 2016-04-06 DIAGNOSIS — N2581 Secondary hyperparathyroidism of renal origin: Secondary | ICD-10-CM | POA: Diagnosis not present

## 2016-04-08 DIAGNOSIS — D631 Anemia in chronic kidney disease: Secondary | ICD-10-CM | POA: Diagnosis not present

## 2016-04-08 DIAGNOSIS — N2581 Secondary hyperparathyroidism of renal origin: Secondary | ICD-10-CM | POA: Diagnosis not present

## 2016-04-08 DIAGNOSIS — Z992 Dependence on renal dialysis: Secondary | ICD-10-CM | POA: Diagnosis not present

## 2016-04-08 DIAGNOSIS — N186 End stage renal disease: Secondary | ICD-10-CM | POA: Diagnosis not present

## 2016-04-08 DIAGNOSIS — E1129 Type 2 diabetes mellitus with other diabetic kidney complication: Secondary | ICD-10-CM | POA: Diagnosis not present

## 2016-04-10 DIAGNOSIS — N186 End stage renal disease: Secondary | ICD-10-CM | POA: Diagnosis not present

## 2016-04-10 DIAGNOSIS — N2581 Secondary hyperparathyroidism of renal origin: Secondary | ICD-10-CM | POA: Diagnosis not present

## 2016-04-10 DIAGNOSIS — E1129 Type 2 diabetes mellitus with other diabetic kidney complication: Secondary | ICD-10-CM | POA: Diagnosis not present

## 2016-04-12 ENCOUNTER — Ambulatory Visit (INDEPENDENT_AMBULATORY_CARE_PROVIDER_SITE_OTHER): Payer: Medicare Other | Admitting: Podiatry

## 2016-04-12 DIAGNOSIS — Q828 Other specified congenital malformations of skin: Secondary | ICD-10-CM

## 2016-04-12 DIAGNOSIS — I739 Peripheral vascular disease, unspecified: Secondary | ICD-10-CM | POA: Diagnosis not present

## 2016-04-12 DIAGNOSIS — B351 Tinea unguium: Secondary | ICD-10-CM

## 2016-04-12 DIAGNOSIS — M79676 Pain in unspecified toe(s): Secondary | ICD-10-CM | POA: Diagnosis not present

## 2016-04-12 DIAGNOSIS — L84 Corns and callosities: Secondary | ICD-10-CM

## 2016-04-12 DIAGNOSIS — E1151 Type 2 diabetes mellitus with diabetic peripheral angiopathy without gangrene: Secondary | ICD-10-CM | POA: Diagnosis not present

## 2016-04-12 NOTE — Progress Notes (Signed)
Patient ID: Clint LippsBetty G Yore, female   DOB: 08/18/1932, 82 y.o.   MRN: [id number]  Subjective: 82 y.o. returns the office today for painful, elongated, thickened toenails and calluses which she cannot trim herself. She also has callus and cons to her feet. Denies any redness or drainage around the nails/calluses. Denies any acute changes since last appointment and no new complaints today. Denies any systemic complaints such as fevers, chills, nausea, vomiting.   Objective: AAO 3, NAD DP/PT pulses decreased Protective sensation decreased with Simms Weinstein monofilament Nails hypertrophic, dystrophic, elongated, brittle, discolored 9. There is tenderness overlying the nails 2-5 on the left and 1-5 on the right. There is no surrounding erythema or drainage along the nail sites. Hyperkeratotic lesions bilateral foot submetatarsal 1 and 5 and lateral 5th digit on the left and right foot. Upon debridement there was no underlying ulceration, drainage or other clinical signs of infection. No open lesions or pre-ulcerative lesions are identified. No other areas of tenderness bilateral lower extremities. No overlying edema, erythema, increased warmth. Previous partial auto-amputation of the left hallux which is healed at this time. No pain with calf compression, swelling, warmth, erythema.  Assessment: Patient presents with symptomatic onychomycosis; pre-ulcerative calluses    Plan: -Treatment options including alternatives, risks, complications were discussed -Nails sharply debrided 9 without complication/bleeding. -Hyperkeratotic lesion sharply debrided 4 without complications/bleeding -Discussed daily foot inspection. If there are any changes, to call the office immediately.  -Follow-up in 3 months or sooner if any problems are to arise. In the meantime, encouraged to call the office with any questions, concerns, changes symptoms.  Ovid CurdMatthew Shaundrea Carrigg, DPM

## 2016-04-13 DIAGNOSIS — N2581 Secondary hyperparathyroidism of renal origin: Secondary | ICD-10-CM | POA: Diagnosis not present

## 2016-04-13 DIAGNOSIS — N186 End stage renal disease: Secondary | ICD-10-CM | POA: Diagnosis not present

## 2016-04-13 DIAGNOSIS — E1129 Type 2 diabetes mellitus with other diabetic kidney complication: Secondary | ICD-10-CM | POA: Diagnosis not present

## 2016-04-15 DIAGNOSIS — N186 End stage renal disease: Secondary | ICD-10-CM | POA: Diagnosis not present

## 2016-04-15 DIAGNOSIS — E1129 Type 2 diabetes mellitus with other diabetic kidney complication: Secondary | ICD-10-CM | POA: Diagnosis not present

## 2016-04-15 DIAGNOSIS — N2581 Secondary hyperparathyroidism of renal origin: Secondary | ICD-10-CM | POA: Diagnosis not present

## 2016-04-17 DIAGNOSIS — N186 End stage renal disease: Secondary | ICD-10-CM | POA: Diagnosis not present

## 2016-04-17 DIAGNOSIS — N2581 Secondary hyperparathyroidism of renal origin: Secondary | ICD-10-CM | POA: Diagnosis not present

## 2016-04-17 DIAGNOSIS — E1129 Type 2 diabetes mellitus with other diabetic kidney complication: Secondary | ICD-10-CM | POA: Diagnosis not present

## 2016-04-20 DIAGNOSIS — N2581 Secondary hyperparathyroidism of renal origin: Secondary | ICD-10-CM | POA: Diagnosis not present

## 2016-04-20 DIAGNOSIS — E1129 Type 2 diabetes mellitus with other diabetic kidney complication: Secondary | ICD-10-CM | POA: Diagnosis not present

## 2016-04-20 DIAGNOSIS — N186 End stage renal disease: Secondary | ICD-10-CM | POA: Diagnosis not present

## 2016-04-22 DIAGNOSIS — N2581 Secondary hyperparathyroidism of renal origin: Secondary | ICD-10-CM | POA: Diagnosis not present

## 2016-04-22 DIAGNOSIS — N186 End stage renal disease: Secondary | ICD-10-CM | POA: Diagnosis not present

## 2016-04-22 DIAGNOSIS — E1129 Type 2 diabetes mellitus with other diabetic kidney complication: Secondary | ICD-10-CM | POA: Diagnosis not present

## 2016-04-24 DIAGNOSIS — N186 End stage renal disease: Secondary | ICD-10-CM | POA: Diagnosis not present

## 2016-04-24 DIAGNOSIS — E1129 Type 2 diabetes mellitus with other diabetic kidney complication: Secondary | ICD-10-CM | POA: Diagnosis not present

## 2016-04-24 DIAGNOSIS — N2581 Secondary hyperparathyroidism of renal origin: Secondary | ICD-10-CM | POA: Diagnosis not present

## 2016-04-27 DIAGNOSIS — N2581 Secondary hyperparathyroidism of renal origin: Secondary | ICD-10-CM | POA: Diagnosis not present

## 2016-04-27 DIAGNOSIS — N186 End stage renal disease: Secondary | ICD-10-CM | POA: Diagnosis not present

## 2016-04-27 DIAGNOSIS — E1129 Type 2 diabetes mellitus with other diabetic kidney complication: Secondary | ICD-10-CM | POA: Diagnosis not present

## 2016-04-29 DIAGNOSIS — N2581 Secondary hyperparathyroidism of renal origin: Secondary | ICD-10-CM | POA: Diagnosis not present

## 2016-04-29 DIAGNOSIS — E1129 Type 2 diabetes mellitus with other diabetic kidney complication: Secondary | ICD-10-CM | POA: Diagnosis not present

## 2016-04-29 DIAGNOSIS — N186 End stage renal disease: Secondary | ICD-10-CM | POA: Diagnosis not present

## 2016-04-30 DIAGNOSIS — Z6837 Body mass index (BMI) 37.0-37.9, adult: Secondary | ICD-10-CM | POA: Diagnosis not present

## 2016-04-30 DIAGNOSIS — M25561 Pain in right knee: Secondary | ICD-10-CM | POA: Diagnosis not present

## 2016-05-01 DIAGNOSIS — E1129 Type 2 diabetes mellitus with other diabetic kidney complication: Secondary | ICD-10-CM | POA: Diagnosis not present

## 2016-05-01 DIAGNOSIS — N186 End stage renal disease: Secondary | ICD-10-CM | POA: Diagnosis not present

## 2016-05-01 DIAGNOSIS — N2581 Secondary hyperparathyroidism of renal origin: Secondary | ICD-10-CM | POA: Diagnosis not present

## 2016-05-04 DIAGNOSIS — E1129 Type 2 diabetes mellitus with other diabetic kidney complication: Secondary | ICD-10-CM | POA: Diagnosis not present

## 2016-05-04 DIAGNOSIS — N186 End stage renal disease: Secondary | ICD-10-CM | POA: Diagnosis not present

## 2016-05-04 DIAGNOSIS — N2581 Secondary hyperparathyroidism of renal origin: Secondary | ICD-10-CM | POA: Diagnosis not present

## 2016-05-06 DIAGNOSIS — E1129 Type 2 diabetes mellitus with other diabetic kidney complication: Secondary | ICD-10-CM | POA: Diagnosis not present

## 2016-05-06 DIAGNOSIS — N2581 Secondary hyperparathyroidism of renal origin: Secondary | ICD-10-CM | POA: Diagnosis not present

## 2016-05-06 DIAGNOSIS — N186 End stage renal disease: Secondary | ICD-10-CM | POA: Diagnosis not present

## 2016-05-08 DIAGNOSIS — E1129 Type 2 diabetes mellitus with other diabetic kidney complication: Secondary | ICD-10-CM | POA: Diagnosis not present

## 2016-05-08 DIAGNOSIS — N2581 Secondary hyperparathyroidism of renal origin: Secondary | ICD-10-CM | POA: Diagnosis not present

## 2016-05-08 DIAGNOSIS — N186 End stage renal disease: Secondary | ICD-10-CM | POA: Diagnosis not present

## 2016-05-09 DIAGNOSIS — E1129 Type 2 diabetes mellitus with other diabetic kidney complication: Secondary | ICD-10-CM | POA: Diagnosis not present

## 2016-05-09 DIAGNOSIS — N186 End stage renal disease: Secondary | ICD-10-CM | POA: Diagnosis not present

## 2016-05-09 DIAGNOSIS — Z992 Dependence on renal dialysis: Secondary | ICD-10-CM | POA: Diagnosis not present

## 2016-05-11 DIAGNOSIS — E1129 Type 2 diabetes mellitus with other diabetic kidney complication: Secondary | ICD-10-CM | POA: Diagnosis not present

## 2016-05-11 DIAGNOSIS — D631 Anemia in chronic kidney disease: Secondary | ICD-10-CM | POA: Diagnosis not present

## 2016-05-11 DIAGNOSIS — N2581 Secondary hyperparathyroidism of renal origin: Secondary | ICD-10-CM | POA: Diagnosis not present

## 2016-05-11 DIAGNOSIS — N186 End stage renal disease: Secondary | ICD-10-CM | POA: Diagnosis not present

## 2016-05-13 DIAGNOSIS — N186 End stage renal disease: Secondary | ICD-10-CM | POA: Diagnosis not present

## 2016-05-13 DIAGNOSIS — E1129 Type 2 diabetes mellitus with other diabetic kidney complication: Secondary | ICD-10-CM | POA: Diagnosis not present

## 2016-05-13 DIAGNOSIS — N2581 Secondary hyperparathyroidism of renal origin: Secondary | ICD-10-CM | POA: Diagnosis not present

## 2016-05-13 DIAGNOSIS — D631 Anemia in chronic kidney disease: Secondary | ICD-10-CM | POA: Diagnosis not present

## 2016-05-15 DIAGNOSIS — E1129 Type 2 diabetes mellitus with other diabetic kidney complication: Secondary | ICD-10-CM | POA: Diagnosis not present

## 2016-05-15 DIAGNOSIS — N186 End stage renal disease: Secondary | ICD-10-CM | POA: Diagnosis not present

## 2016-05-15 DIAGNOSIS — D631 Anemia in chronic kidney disease: Secondary | ICD-10-CM | POA: Diagnosis not present

## 2016-05-15 DIAGNOSIS — N2581 Secondary hyperparathyroidism of renal origin: Secondary | ICD-10-CM | POA: Diagnosis not present

## 2016-05-18 DIAGNOSIS — E1129 Type 2 diabetes mellitus with other diabetic kidney complication: Secondary | ICD-10-CM | POA: Diagnosis not present

## 2016-05-18 DIAGNOSIS — N2581 Secondary hyperparathyroidism of renal origin: Secondary | ICD-10-CM | POA: Diagnosis not present

## 2016-05-18 DIAGNOSIS — D631 Anemia in chronic kidney disease: Secondary | ICD-10-CM | POA: Diagnosis not present

## 2016-05-18 DIAGNOSIS — N186 End stage renal disease: Secondary | ICD-10-CM | POA: Diagnosis not present

## 2016-05-20 DIAGNOSIS — E1129 Type 2 diabetes mellitus with other diabetic kidney complication: Secondary | ICD-10-CM | POA: Diagnosis not present

## 2016-05-20 DIAGNOSIS — N2581 Secondary hyperparathyroidism of renal origin: Secondary | ICD-10-CM | POA: Diagnosis not present

## 2016-05-20 DIAGNOSIS — D631 Anemia in chronic kidney disease: Secondary | ICD-10-CM | POA: Diagnosis not present

## 2016-05-20 DIAGNOSIS — N186 End stage renal disease: Secondary | ICD-10-CM | POA: Diagnosis not present

## 2016-05-22 DIAGNOSIS — N186 End stage renal disease: Secondary | ICD-10-CM | POA: Diagnosis not present

## 2016-05-22 DIAGNOSIS — E1129 Type 2 diabetes mellitus with other diabetic kidney complication: Secondary | ICD-10-CM | POA: Diagnosis not present

## 2016-05-22 DIAGNOSIS — D631 Anemia in chronic kidney disease: Secondary | ICD-10-CM | POA: Diagnosis not present

## 2016-05-22 DIAGNOSIS — N2581 Secondary hyperparathyroidism of renal origin: Secondary | ICD-10-CM | POA: Diagnosis not present

## 2016-05-25 DIAGNOSIS — N2581 Secondary hyperparathyroidism of renal origin: Secondary | ICD-10-CM | POA: Diagnosis not present

## 2016-05-25 DIAGNOSIS — N186 End stage renal disease: Secondary | ICD-10-CM | POA: Diagnosis not present

## 2016-05-25 DIAGNOSIS — E1129 Type 2 diabetes mellitus with other diabetic kidney complication: Secondary | ICD-10-CM | POA: Diagnosis not present

## 2016-05-25 DIAGNOSIS — D631 Anemia in chronic kidney disease: Secondary | ICD-10-CM | POA: Diagnosis not present

## 2016-05-27 DIAGNOSIS — N186 End stage renal disease: Secondary | ICD-10-CM | POA: Diagnosis not present

## 2016-05-27 DIAGNOSIS — D631 Anemia in chronic kidney disease: Secondary | ICD-10-CM | POA: Diagnosis not present

## 2016-05-27 DIAGNOSIS — N2581 Secondary hyperparathyroidism of renal origin: Secondary | ICD-10-CM | POA: Diagnosis not present

## 2016-05-27 DIAGNOSIS — E1129 Type 2 diabetes mellitus with other diabetic kidney complication: Secondary | ICD-10-CM | POA: Diagnosis not present

## 2016-05-29 DIAGNOSIS — N2581 Secondary hyperparathyroidism of renal origin: Secondary | ICD-10-CM | POA: Diagnosis not present

## 2016-05-29 DIAGNOSIS — N186 End stage renal disease: Secondary | ICD-10-CM | POA: Diagnosis not present

## 2016-05-29 DIAGNOSIS — E1129 Type 2 diabetes mellitus with other diabetic kidney complication: Secondary | ICD-10-CM | POA: Diagnosis not present

## 2016-05-29 DIAGNOSIS — D631 Anemia in chronic kidney disease: Secondary | ICD-10-CM | POA: Diagnosis not present

## 2016-06-01 DIAGNOSIS — E1129 Type 2 diabetes mellitus with other diabetic kidney complication: Secondary | ICD-10-CM | POA: Diagnosis not present

## 2016-06-01 DIAGNOSIS — N2581 Secondary hyperparathyroidism of renal origin: Secondary | ICD-10-CM | POA: Diagnosis not present

## 2016-06-01 DIAGNOSIS — N186 End stage renal disease: Secondary | ICD-10-CM | POA: Diagnosis not present

## 2016-06-01 DIAGNOSIS — D631 Anemia in chronic kidney disease: Secondary | ICD-10-CM | POA: Diagnosis not present

## 2016-06-03 DIAGNOSIS — E1129 Type 2 diabetes mellitus with other diabetic kidney complication: Secondary | ICD-10-CM | POA: Diagnosis not present

## 2016-06-03 DIAGNOSIS — N186 End stage renal disease: Secondary | ICD-10-CM | POA: Diagnosis not present

## 2016-06-03 DIAGNOSIS — N2581 Secondary hyperparathyroidism of renal origin: Secondary | ICD-10-CM | POA: Diagnosis not present

## 2016-06-03 DIAGNOSIS — D631 Anemia in chronic kidney disease: Secondary | ICD-10-CM | POA: Diagnosis not present

## 2016-06-05 DIAGNOSIS — D631 Anemia in chronic kidney disease: Secondary | ICD-10-CM | POA: Diagnosis not present

## 2016-06-05 DIAGNOSIS — N186 End stage renal disease: Secondary | ICD-10-CM | POA: Diagnosis not present

## 2016-06-05 DIAGNOSIS — E1129 Type 2 diabetes mellitus with other diabetic kidney complication: Secondary | ICD-10-CM | POA: Diagnosis not present

## 2016-06-05 DIAGNOSIS — N2581 Secondary hyperparathyroidism of renal origin: Secondary | ICD-10-CM | POA: Diagnosis not present

## 2016-06-08 DIAGNOSIS — N186 End stage renal disease: Secondary | ICD-10-CM | POA: Diagnosis not present

## 2016-06-08 DIAGNOSIS — E1129 Type 2 diabetes mellitus with other diabetic kidney complication: Secondary | ICD-10-CM | POA: Diagnosis not present

## 2016-06-08 DIAGNOSIS — D631 Anemia in chronic kidney disease: Secondary | ICD-10-CM | POA: Diagnosis not present

## 2016-06-08 DIAGNOSIS — N2581 Secondary hyperparathyroidism of renal origin: Secondary | ICD-10-CM | POA: Diagnosis not present

## 2016-06-09 DIAGNOSIS — E1129 Type 2 diabetes mellitus with other diabetic kidney complication: Secondary | ICD-10-CM | POA: Diagnosis not present

## 2016-06-09 DIAGNOSIS — N186 End stage renal disease: Secondary | ICD-10-CM | POA: Diagnosis not present

## 2016-06-09 DIAGNOSIS — Z992 Dependence on renal dialysis: Secondary | ICD-10-CM | POA: Diagnosis not present

## 2016-06-10 DIAGNOSIS — N2581 Secondary hyperparathyroidism of renal origin: Secondary | ICD-10-CM | POA: Diagnosis not present

## 2016-06-10 DIAGNOSIS — N186 End stage renal disease: Secondary | ICD-10-CM | POA: Diagnosis not present

## 2016-06-10 DIAGNOSIS — D631 Anemia in chronic kidney disease: Secondary | ICD-10-CM | POA: Diagnosis not present

## 2016-06-10 DIAGNOSIS — E1129 Type 2 diabetes mellitus with other diabetic kidney complication: Secondary | ICD-10-CM | POA: Diagnosis not present

## 2016-06-12 DIAGNOSIS — N186 End stage renal disease: Secondary | ICD-10-CM | POA: Diagnosis not present

## 2016-06-12 DIAGNOSIS — D631 Anemia in chronic kidney disease: Secondary | ICD-10-CM | POA: Diagnosis not present

## 2016-06-12 DIAGNOSIS — N2581 Secondary hyperparathyroidism of renal origin: Secondary | ICD-10-CM | POA: Diagnosis not present

## 2016-06-12 DIAGNOSIS — E1129 Type 2 diabetes mellitus with other diabetic kidney complication: Secondary | ICD-10-CM | POA: Diagnosis not present

## 2016-06-14 ENCOUNTER — Ambulatory Visit (INDEPENDENT_AMBULATORY_CARE_PROVIDER_SITE_OTHER): Payer: Medicare Other | Admitting: Podiatry

## 2016-06-14 ENCOUNTER — Encounter: Payer: Self-pay | Admitting: Podiatry

## 2016-06-14 DIAGNOSIS — B351 Tinea unguium: Secondary | ICD-10-CM

## 2016-06-14 DIAGNOSIS — L03032 Cellulitis of left toe: Secondary | ICD-10-CM

## 2016-06-14 DIAGNOSIS — M79676 Pain in unspecified toe(s): Secondary | ICD-10-CM

## 2016-06-14 DIAGNOSIS — Q828 Other specified congenital malformations of skin: Secondary | ICD-10-CM | POA: Diagnosis not present

## 2016-06-14 DIAGNOSIS — E1151 Type 2 diabetes mellitus with diabetic peripheral angiopathy without gangrene: Secondary | ICD-10-CM | POA: Diagnosis not present

## 2016-06-14 MED ORDER — CEPHALEXIN 500 MG PO CAPS: 500.0000 mg | ORAL_CAPSULE | Freq: Two times a day (BID) | ORAL | 2 refills | Status: DC

## 2016-06-15 DIAGNOSIS — N186 End stage renal disease: Secondary | ICD-10-CM | POA: Diagnosis not present

## 2016-06-15 DIAGNOSIS — N2581 Secondary hyperparathyroidism of renal origin: Secondary | ICD-10-CM | POA: Diagnosis not present

## 2016-06-15 DIAGNOSIS — E1129 Type 2 diabetes mellitus with other diabetic kidney complication: Secondary | ICD-10-CM | POA: Diagnosis not present

## 2016-06-15 DIAGNOSIS — D631 Anemia in chronic kidney disease: Secondary | ICD-10-CM | POA: Diagnosis not present

## 2016-06-16 ENCOUNTER — Other Ambulatory Visit: Payer: Self-pay | Admitting: *Deleted

## 2016-06-16 DIAGNOSIS — L98499 Non-pressure chronic ulcer of skin of other sites with unspecified severity: Secondary | ICD-10-CM

## 2016-06-17 DIAGNOSIS — E1129 Type 2 diabetes mellitus with other diabetic kidney complication: Secondary | ICD-10-CM | POA: Diagnosis not present

## 2016-06-17 DIAGNOSIS — N186 End stage renal disease: Secondary | ICD-10-CM | POA: Diagnosis not present

## 2016-06-17 DIAGNOSIS — D631 Anemia in chronic kidney disease: Secondary | ICD-10-CM | POA: Diagnosis not present

## 2016-06-17 DIAGNOSIS — N2581 Secondary hyperparathyroidism of renal origin: Secondary | ICD-10-CM | POA: Diagnosis not present

## 2016-06-18 ENCOUNTER — Other Ambulatory Visit: Payer: Self-pay

## 2016-06-18 ENCOUNTER — Encounter: Payer: Self-pay | Admitting: Vascular Surgery

## 2016-06-18 ENCOUNTER — Ambulatory Visit (HOSPITAL_COMMUNITY)
Admission: RE | Admit: 2016-06-18 | Discharge: 2016-06-18 | Disposition: A | Discharge status: Home / Self Care | Payer: Medicare Other | Source: Ambulatory Visit | Attending: Vascular Surgery | Admitting: Vascular Surgery

## 2016-06-18 ENCOUNTER — Ambulatory Visit (INDEPENDENT_AMBULATORY_CARE_PROVIDER_SITE_OTHER): Payer: Medicare Other | Admitting: Vascular Surgery

## 2016-06-18 VITALS — BP 178/76 | HR 79 | Temp 97.0°F | Resp 20 | Ht 62.0 in | Wt 198.0 lb

## 2016-06-18 DIAGNOSIS — L98499 Non-pressure chronic ulcer of skin of other sites with unspecified severity: Secondary | ICD-10-CM | POA: Diagnosis not present

## 2016-06-18 DIAGNOSIS — I739 Peripheral vascular disease, unspecified: Secondary | ICD-10-CM | POA: Diagnosis not present

## 2016-06-18 LAB — WOUND CULTURE
Gram Stain: NONE SEEN
Gram Stain: NONE SEEN
ORGANISM ID, BACTERIA: NORMAL

## 2016-06-18 NOTE — Progress Notes (Signed)
Patient ID: Clint LippsBetty G Kaminski, female   DOB: 12/01/1932, 83 y.o.   MRN: [id number]  Subjective: 83 y.o. returns the office today for painful, elongated, thickened toenails and calluses which she cannot trim herself. She also has callus and cons to her feet. Denies any redness or drainage around the nails/calluses. Denies any acute changes since last appointment and no new complaints today. Denies any systemic complaints such as fevers, chills, nausea, vomiting.   Objective: AAO 3, NAD DP/PT pulses decreased Protective sensation decreased with Simms Weinstein monofilament Nails hypertrophic, dystrophic, elongated, brittle, discolored 9. There is tenderness overlying the nails 2-5 on the left and 1-5 on the right. There is no surrounding erythema or drainage along the nail sites. Hyperkeratotic lesions bilateral foot submetatarsal 1 and 5 and lateral 5th digit on the left and right foot.upon debridement there was purulence expressed underneath the left fifth digit hyperkeratotic lesion. There is a superficial granular wound present. There is no probing to bone, undermining or tunneling. There is no edema, erythema, increase in warmth to the toe. There is no ulceration, drainage underneath the other hyperkeratotic lesions.  No other open lesions or pre-ulcerative lesions are identified. Previous partial auto-amputation of the left hallux which is healed at this time. No pain with calf compression, swelling, warmth, erythema.  Assessment: Patient presents with symptomatic onychomycosis; pre-ulcerative calluses ; abscess left fifth toe with underlying ulceration  Plan: -Treatment options including alternatives, risks, complications were discussed -Nails sharply debrided 9 without complication/bleeding. -Hyperkeratotic lesion sharply debrided  3 without complications/bleeding -Hyperkeratotic lesion left fifth toe was debrided to reveal underlying abscess, ulceration. The drainage was cultured. We will  start Keflex. Iodosorb dressing changes daily. Monitor his signs or symptoms of worsening infection of the ER should any occur. -Discussed daily foot inspection. If there are any changes, to call the office immediately.  -Follow-up in 10 days or sooner if any problems are to arise. In the meantime, encouraged to call the office with any questions, concerns, changes symptoms.  Ovid CurdMatthew Perley Arthurs, DPM

## 2016-06-18 NOTE — Progress Notes (Signed)
Patient ID: [name], female   DOB: 1932-10-31, 83 y.o.   MRN: [id number]  Reason for Consult: Re-evaluation (Ischemic Ulcer on left 5th toe and hx PVD-Dr. Arrie [name] [phone].  )   Referred by Jamie Palmer, MD  Subjective:     HPI:  [name] is a 83 y.o. female with a history of end-stage renal disease. She now dialyzes via a right IJ tunneled dialysis catheter after having failed right femoral grafting and inability to place a left femoral graft by Dr. Imogene [name] last February. She now has pus in her left small toe although she is without fevers or any systemic illness. She is followed by Dr. Ardelle [name] with podiatry and she is now referred for potential arterial disease. She does continue to walk with the use of a cane although she states she does not walk fast enough to get cramping in her calves. She denies rest pain. She has had a previous toe ulceration also on the left that is healed with the use of hyperbarics at the wound center. She has never had vascular surgery other than dialysis access in the past. She is diabetic with hypertension and incision renal disease. She does take aspirin daily.  Past Medical History:  Diagnosis Date  . Anemia   . C. difficile diarrhea   . Chronic kidney disease    on Dialysis  . Depression   . Diabetes mellitus   . Diabetic neuropathy (HCC)   . GERD (gastroesophageal reflux disease)   . Gout   . Hyperparathyroidism   . MRSA (methicillin resistant staph aureus) culture positive   . Obesity   . Osteoarthritis   . Peripheral arterial disease (HCC)    Gangrene-Left Great Toe  . Shortness of breath dyspnea    with exertion  . Stroke [facility])    Family History  Problem Relation Age of Onset  . Cancer Father     type unknown  . Stroke Mother   . Diabetes Sister   . Cancer Brother     type unknown   Past Surgical History:  Procedure Laterality Date  . ABDOMINAL HYSTERECTOMY    . AV FISTULA PLACEMENT Left 07/07/2015   Procedure:  ATTEMPTED INSERTION OFLEFT  ARTERIOVENOUS (AV) GORE-TEX GRAFT THIGH;  Surgeon: Fransisco Hertz, MD;  Location: [facility] OR;  Service: Vascular;  Laterality: Left;  . BREAST BIOPSY Right   . COLONOSCOPY    . MULTIPLE TOOTH EXTRACTIONS    . SHUNTOGRAM Right 09/06/12   Thigh graft  . SP DECLOT AVGG Right Sep 14, 2012   Right thigh graft    Short Social History:  Social History  Substance Use Topics  . Smoking status: Former Smoker    Types: Cigarettes  . Smokeless tobacco: Never Used  . Alcohol use No    Allergies  Allergen Reactions  . Codeine Hives  . Vancomycin Rash    Current Outpatient Prescriptions  Medication Sig Dispense Refill  . acetaminophen (TYLENOL) 500 MG tablet Take 500-1,000 mg by mouth every 6 (six) hours as needed for mild pain.     Marland Kitchen aspirin 325 MG EC tablet Take 325 mg by mouth daily.    . cadexomer iodine (IODOSORB) 0.9 % gel Apply 1 application topically daily as needed for wound care.    . cephALEXin (KEFLEX) 500 MG capsule Take 1 capsule (500 mg total) by mouth 2 (two) times daily. 20 capsule 2  . Doxercalciferol (HECTOROL) 2 MCG/ML SOLN Inject 3 mcg into the vein 3 (  three) times a week.    . gabapentin (NEURONTIN) 100 MG capsule Take 100 mg by mouth 2 (two) times daily.     . IRON SUCROSE IV Inject 1 Dose into the vein once a week.    Marland Kitchen. RENVELA 800 MG tablet Take 1,600-2,400 mg by mouth 3 (three) times daily with meals. 2400mg  with meals, 1600mg  with snacks    . SENSIPAR 30 MG tablet Take 30 mg by mouth every evening.     . Methoxy PEG-Epoetin Beta (MIRCERA) 75 MCG/0.3ML SOSY Inject 75 mcg as directed every 14 (fourteen) days.    Marland Kitchen. oxyCODONE-acetaminophen (PERCOCET/ROXICET) 5-325 MG tablet Take 1 tablet by mouth every 6 (six) hours as needed. (Patient not taking: Reported on 06/18/2016) 10 tablet 0   No current facility-administered medications for this visit.     Review of Systems  Constitutional:  Constitutional negative. Eyes: Eyes negative.  Respiratory:  Respiratory negative.  Cardiovascular: Cardiovascular negative.  GI: Gastrointestinal negative.  Musculoskeletal: Musculoskeletal negative.  Skin: Positive for wound.  Neurological: Neurological negative. Hematologic: Hematologic/lymphatic negative.  Psychiatric: Psychiatric negative.        Objective:  Objective   Vitals:   06/18/16 0942  BP: (!) 178/76  Pulse: 79  Resp: 20  Temp: 97 F (36.1 C)  TempSrc: Oral  SpO2: 99%  Weight: 198 lb (89.8 kg)  Height: 5\' 2"  (1.575 m)   Body mass index is 36.21 kg/m.  Physical Exam  Constitutional: She appears well-developed.  HENT:  Head: Normocephalic.  Eyes: EOM are normal.  Neck: Neck supple.  Cardiovascular: Normal rate.   Pulses:      Radial pulses are 2+ on the right side, and 2+ on the left side.       Femoral pulses are 1+ on the right side, and 1+ on the left side.      Popliteal pulses are 0 on the right side, and 0 on the left side.  Pulmonary/Chest: Effort normal.  Abdominal: Soft. She exhibits no mass.  Musculoskeletal: Normal range of motion. She exhibits edema.  Left baby toe with tip dry gangrene, no purulence or erythema  Lymphadenopathy:    She has no cervical adenopathy.  Skin: No rash noted. No erythema.  Psychiatric: She has a normal mood and affect. Her behavior is normal. Judgment and thought content normal.    Data: ABIs today were noncompressible bilaterally. She has monophasic DP and PT bilaterally with a toe pressure on the right side of 55.     Assessment/Plan:     83 year old African female end-stage renal disease and diabetes and hypertension on daily aspirin. She has a previous expiration of her left groin for placement of thigh AV graft was noted to have significant iliofemoral disease at the time. She now has left toe ulceration without palpable popliteal pulse and with monophasic signals DP and PT bilaterally.  I discussed with her the risk and benefits of proceeding with aortogram  bilateral lower extremity runoff possible intervention on the left. She is amenable to this but any further surgeries she will possibly not want. We will schedule her for procedure and she will continue to follow with Dr. Ardelle AntonWagoner.    Maeola HarmanBrandon Christopher Joud Pettinato MD [facility]

## 2016-06-19 DIAGNOSIS — N2581 Secondary hyperparathyroidism of renal origin: Secondary | ICD-10-CM | POA: Diagnosis not present

## 2016-06-19 DIAGNOSIS — D631 Anemia in chronic kidney disease: Secondary | ICD-10-CM | POA: Diagnosis not present

## 2016-06-19 DIAGNOSIS — E1129 Type 2 diabetes mellitus with other diabetic kidney complication: Secondary | ICD-10-CM | POA: Diagnosis not present

## 2016-06-19 DIAGNOSIS — N186 End stage renal disease: Secondary | ICD-10-CM | POA: Diagnosis not present

## 2016-06-22 DIAGNOSIS — E1129 Type 2 diabetes mellitus with other diabetic kidney complication: Secondary | ICD-10-CM | POA: Diagnosis not present

## 2016-06-22 DIAGNOSIS — N186 End stage renal disease: Secondary | ICD-10-CM | POA: Diagnosis not present

## 2016-06-22 DIAGNOSIS — D631 Anemia in chronic kidney disease: Secondary | ICD-10-CM | POA: Diagnosis not present

## 2016-06-22 DIAGNOSIS — N2581 Secondary hyperparathyroidism of renal origin: Secondary | ICD-10-CM | POA: Diagnosis not present

## 2016-06-24 DIAGNOSIS — N186 End stage renal disease: Secondary | ICD-10-CM | POA: Diagnosis not present

## 2016-06-24 DIAGNOSIS — N2581 Secondary hyperparathyroidism of renal origin: Secondary | ICD-10-CM | POA: Diagnosis not present

## 2016-06-24 DIAGNOSIS — D631 Anemia in chronic kidney disease: Secondary | ICD-10-CM | POA: Diagnosis not present

## 2016-06-24 DIAGNOSIS — E1129 Type 2 diabetes mellitus with other diabetic kidney complication: Secondary | ICD-10-CM | POA: Diagnosis not present

## 2016-06-25 ENCOUNTER — Ambulatory Visit (INDEPENDENT_AMBULATORY_CARE_PROVIDER_SITE_OTHER): Payer: Medicare Other | Admitting: Podiatry

## 2016-06-25 ENCOUNTER — Encounter: Payer: Self-pay | Admitting: Podiatry

## 2016-06-25 DIAGNOSIS — I739 Peripheral vascular disease, unspecified: Secondary | ICD-10-CM | POA: Diagnosis not present

## 2016-06-25 DIAGNOSIS — E1151 Type 2 diabetes mellitus with diabetic peripheral angiopathy without gangrene: Secondary | ICD-10-CM | POA: Diagnosis not present

## 2016-06-25 DIAGNOSIS — L97521 Non-pressure chronic ulcer of other part of left foot limited to breakdown of skin: Secondary | ICD-10-CM

## 2016-06-25 DIAGNOSIS — L84 Corns and callosities: Secondary | ICD-10-CM | POA: Diagnosis not present

## 2016-06-26 DIAGNOSIS — N2581 Secondary hyperparathyroidism of renal origin: Secondary | ICD-10-CM | POA: Diagnosis not present

## 2016-06-26 DIAGNOSIS — D631 Anemia in chronic kidney disease: Secondary | ICD-10-CM | POA: Diagnosis not present

## 2016-06-26 DIAGNOSIS — E1129 Type 2 diabetes mellitus with other diabetic kidney complication: Secondary | ICD-10-CM | POA: Diagnosis not present

## 2016-06-26 DIAGNOSIS — N186 End stage renal disease: Secondary | ICD-10-CM | POA: Diagnosis not present

## 2016-06-27 NOTE — Progress Notes (Signed)
Subjective: [name] presents to the office today for follow-up of an ulcer on the left 5th toe. She has completed her antibiotics. She states that she has not had any pain and not noticed any drainage, redness, swelling. Her only complaint today is that she has a painful callus on the right foot submetatarsal 1. Denies any systemic complaints such as fevers, chills, nausea, vomiting. No acute changes since last appointment, and no other complaints at this time.   Objective: AAO x3, NAD DP/PT pulses decreased  Ulceration on the left 5th toe appears to be almost completely healed today. There is a small amount of hyperkertotic lesion. Upon debridement there is a superficial scab type lesion. There is no drainage or pus today. No edema. No surrounding erythema, ascending cellulitis, malodor.  Right submet 1 callus. No underlying ulceration, drainage, signs of infection.  No open lesions or pre-ulcerative lesions.  No pain with calf compression, swelling, warmth, erythema  Assessment: Resolving ulcer left 5th toe; pre-ulcerative callus right foot.   Plan: -All treatment options discussed with the patient including all alternatives, risks, complications.  -Wound sharply debrided x 1 without complications or bleeding. The wound appears to be almost healed at this time. Continue a small amount of abx ointment and bandage daily. If not healed in the next 2 weeks to call the office but it is very superificial and there are no signs of infection. Will hold further antibiotics.  -Pre-ulcerative callus right foot sharply debrided x 1 without complications or bleeding.  -Patient encouraged to call the office with any questions, concerns, change in symptoms.   [name], DPM

## 2016-06-29 DIAGNOSIS — N186 End stage renal disease: Secondary | ICD-10-CM | POA: Diagnosis not present

## 2016-06-29 DIAGNOSIS — E1129 Type 2 diabetes mellitus with other diabetic kidney complication: Secondary | ICD-10-CM | POA: Diagnosis not present

## 2016-06-29 DIAGNOSIS — N2581 Secondary hyperparathyroidism of renal origin: Secondary | ICD-10-CM | POA: Diagnosis not present

## 2016-06-29 DIAGNOSIS — D631 Anemia in chronic kidney disease: Secondary | ICD-10-CM | POA: Diagnosis not present

## 2016-07-01 DIAGNOSIS — E1129 Type 2 diabetes mellitus with other diabetic kidney complication: Secondary | ICD-10-CM | POA: Diagnosis not present

## 2016-07-01 DIAGNOSIS — N186 End stage renal disease: Secondary | ICD-10-CM | POA: Diagnosis not present

## 2016-07-01 DIAGNOSIS — N2581 Secondary hyperparathyroidism of renal origin: Secondary | ICD-10-CM | POA: Diagnosis not present

## 2016-07-01 DIAGNOSIS — D631 Anemia in chronic kidney disease: Secondary | ICD-10-CM | POA: Diagnosis not present

## 2016-07-03 DIAGNOSIS — E1129 Type 2 diabetes mellitus with other diabetic kidney complication: Secondary | ICD-10-CM | POA: Diagnosis not present

## 2016-07-03 DIAGNOSIS — N186 End stage renal disease: Secondary | ICD-10-CM | POA: Diagnosis not present

## 2016-07-03 DIAGNOSIS — N2581 Secondary hyperparathyroidism of renal origin: Secondary | ICD-10-CM | POA: Diagnosis not present

## 2016-07-03 DIAGNOSIS — D631 Anemia in chronic kidney disease: Secondary | ICD-10-CM | POA: Diagnosis not present

## 2016-07-05 ENCOUNTER — Encounter (HOSPITAL_COMMUNITY)
Admission: RE | Disposition: A | Discharge status: Home / Self Care | Payer: Self-pay | Source: Ambulatory Visit | Attending: Vascular Surgery

## 2016-07-05 ENCOUNTER — Ambulatory Visit (HOSPITAL_COMMUNITY)
Admission: RE | Admit: 2016-07-05 | Discharge: 2016-07-05 | Disposition: A | Discharge status: Home / Self Care | Payer: Medicare Other | Source: Ambulatory Visit | Attending: Vascular Surgery | Admitting: Vascular Surgery

## 2016-07-05 DIAGNOSIS — N186 End stage renal disease: Secondary | ICD-10-CM | POA: Insufficient documentation

## 2016-07-05 DIAGNOSIS — I70202 Unspecified atherosclerosis of native arteries of extremities, left leg: Secondary | ICD-10-CM | POA: Diagnosis not present

## 2016-07-05 DIAGNOSIS — I70244 Atherosclerosis of native arteries of left leg with ulceration of heel and midfoot: Secondary | ICD-10-CM | POA: Diagnosis not present

## 2016-07-05 DIAGNOSIS — I739 Peripheral vascular disease, unspecified: Secondary | ICD-10-CM | POA: Diagnosis present

## 2016-07-05 HISTORY — PX: ABDOMINAL AORTOGRAM: CATH118222

## 2016-07-05 HISTORY — PX: LOWER EXTREMITY ANGIOGRAPHY: CATH118251

## 2016-07-05 LAB — POCT I-STAT, CHEM 8
BUN: 35 mg/dL — ABNORMAL HIGH (ref 6–20)
CREATININE: 8.8 mg/dL — AB (ref 0.44–1.00)
Calcium, Ion: 1.07 mmol/L — ABNORMAL LOW (ref 1.15–1.40)
Chloride: 99 mmol/L — ABNORMAL LOW (ref 101–111)
Glucose, Bld: 96 mg/dL (ref 65–99)
HEMATOCRIT: 38 % (ref 36.0–46.0)
HEMOGLOBIN: 12.9 g/dL (ref 12.0–15.0)
Potassium: 4.2 mmol/L (ref 3.5–5.1)
SODIUM: 139 mmol/L (ref 135–145)
TCO2: 30 mmol/L (ref 0–100)

## 2016-07-05 SURGERY — ABDOMINAL AORTOGRAM

## 2016-07-05 MED ORDER — HYDRALAZINE HCL 20 MG/ML IJ SOLN
INTRAMUSCULAR | Status: DC | PRN
  Administered 2016-07-05: 20 mg via INTRAVENOUS

## 2016-07-05 MED ORDER — LIDOCAINE HCL (PF) 1 % IJ SOLN
INTRAMUSCULAR | Status: AC
  Filled 2016-07-05: qty 30

## 2016-07-05 MED ORDER — SODIUM CHLORIDE 0.9% FLUSH: 3.0000 mL | INTRAVENOUS | Status: DC | PRN

## 2016-07-05 MED ORDER — LIDOCAINE HCL (PF) 1 % IJ SOLN
INTRAMUSCULAR | Status: DC | PRN
  Administered 2016-07-05: 12 mL

## 2016-07-05 MED ORDER — HEPARIN (PORCINE) IN NACL 2-0.9 UNIT/ML-% IJ SOLN
INTRAMUSCULAR | Status: DC | PRN
  Administered 2016-07-05: 1000 mL

## 2016-07-05 MED ORDER — HYDRALAZINE HCL 20 MG/ML IJ SOLN
INTRAMUSCULAR | Status: AC
  Filled 2016-07-05: qty 1

## 2016-07-05 MED ORDER — HEPARIN (PORCINE) IN NACL 2-0.9 UNIT/ML-% IJ SOLN
INTRAMUSCULAR | Status: AC
  Filled 2016-07-05: qty 1000

## 2016-07-05 MED ORDER — IODIXANOL 320 MG/ML IV SOLN
INTRAVENOUS | Status: DC | PRN
  Administered 2016-07-05: 120 mL via INTRAVENOUS

## 2016-07-05 SURGICAL SUPPLY — 12 items
CATH OMNI FLUSH 5F 65CM (CATHETERS) ×2 IMPLANT
CATH TEMPO AQUA 5F 100CM (CATHETERS) ×2 IMPLANT
COVER PRB 48X5XTLSCP FOLD TPE (BAG) IMPLANT
COVER PROBE 5X48 (BAG) ×4
KIT PV (KITS) ×4 IMPLANT
SHEATH PINNACLE 5F 10CM (SHEATH) ×2 IMPLANT
SYR MEDRAD MARK V 150ML (SYRINGE) ×4 IMPLANT
TRANSDUCER W/STOPCOCK (MISCELLANEOUS) ×4 IMPLANT
TRAY PV CATH (CUSTOM PROCEDURE TRAY) ×4 IMPLANT
WIRE BENTSON .035X145CM (WIRE) ×2 IMPLANT
WIRE HI TORQ VERSACORE J 260CM (WIRE) ×2 IMPLANT
WIRE MINI STICK MAX (SHEATH) ×2 IMPLANT

## 2016-07-05 NOTE — Discharge Instructions (Signed)
Femoral Site Care °Introduction °Refer to this sheet in the next few weeks. These instructions provide you with information about caring for yourself after your procedure. Your health care provider may also give you more specific instructions. Your treatment has been planned according to current medical practices, but problems sometimes occur. Call your health care provider if you have any problems or questions after your procedure. °What can I expect after the procedure? °After your procedure, it is typical to have the following: °· Bruising at the site that usually fades within 1-2 weeks. °· Blood collecting in the tissue (hematoma) that may be painful to the touch. It should usually decrease in size and tenderness within 1-2 weeks. °Follow these instructions at home: °· Take medicines only as directed by your health care provider. °· You may shower 24-48 hours after the procedure or as directed by your health care provider. Remove the bandage (dressing) and gently wash the site with plain soap and water. Pat the area dry with a clean towel. Do not rub the site, because this may cause bleeding. °· Do not take baths, swim, or use a hot tub until your health care provider approves. °· Check your insertion site every day for redness, swelling, or drainage. °· Do not apply powder or lotion to the site. °· Limit use of stairs to twice a day for the first 2-3 days or as directed by your health care provider. °· Do not squat for the first 2-3 days or as directed by your health care provider. °· Do not lift over 10 lb (4.5 kg) for 5 days after your procedure or as directed by your health care provider. °· Ask your health care provider when it is okay to: °¨ Return to work or school. °¨ Resume usual physical activities or sports. °¨ Resume sexual activity. °· Do not drive home if you are discharged the same day as the procedure. Have someone else drive you. °· You may drive 24 hours after the procedure unless otherwise  instructed by your health care provider. °· Do not operate machinery or power tools for 24 hours after the procedure or as directed by your health care provider. °· If your procedure was done as an outpatient procedure, which means that you went home the same day as your procedure, a responsible adult should be with you for the first 24 hours after you arrive home. °· Keep all follow-up visits as directed by your health care provider. This is important. °Contact a health care provider if: °· You have a fever. °· You have chills. °· You have increased bleeding from the site. Hold pressure on the site. °Get help right away if: °· You have unusual pain at the site. °· You have redness, warmth, or swelling at the site. °· You have drainage (other than a small amount of blood on the dressing) from the site. °· The site is bleeding, and the bleeding does not stop after 30 minutes of holding steady pressure on the site. °· Your leg or foot becomes pale, cool, tingly, or numb. °This information is not intended to replace advice given to you by your health care provider. Make sure you discuss any questions you have with your health care provider. °Document Released: 12/28/2013 Document Revised: 10/02/2015 Document Reviewed: 11/13/2013 °© 2017 Elsevier ° °

## 2016-07-05 NOTE — H&P (Signed)
     History and Physical Update  The patient was interviewed and re-examined.  The patient's previous History and Physical has been reviewed and is unchanged from office visit.   Brandon C. Randie Heinzain, MD [facility] Office: [phone] Pager: [phone]   07/05/2016, 10:57 AM

## 2016-07-05 NOTE — Progress Notes (Signed)
Site area: Right groin a 5 french arterial sheath was removed  Site Prior to Removal:  Level 0  Pressure Applied For 15 MINUTES    Bedrest Beginning at 1450p  Manual:   Yes.    Patient Status During Pull:  stable  Post Pull Groin Site:  Level 0  Post Pull Instructions Given:  Yes.    Post Pull Pulses Present:  Yes.    Dressing Applied:  Yes.    Comments:  VS remain stable during short stay

## 2016-07-05 NOTE — Op Note (Signed)
    Patient name: Clint LippsBetty G Caltagirone MRN: [id number] DOB: 07/23/1932 Sex: female  07/05/2016 Pre-operative Diagnosis: esrd, left foot healed ulceration Post-operative diagnosis:  Same Surgeon:  Luanna SalkBrandon C. Randie Heinzain, MD Procedure Performed: 1.  US guided cannulation of right common femoral artery 2.  Aortogram with bilateral lower extremity runoff 3.  Selective cannulation of left popliteal artery  Indications:  83 year old female history end-stage renal disease with evaluated with wound on her left fifth toe. I wound has now healed she has concern for multilevel vascular disease as indicated for an scram with possible intervention.  Findings: Aorto-iliac segments are heavily calcified with hemodynamic significant stenoses, there are multiple less than 50% stenoses from her left SFA to her below-knee popliteal artery. The peroneal artery is the dominant runoff on the left side to the level of the ankle where it gives rise to a DP artery and the arch. No intervention was undertaken.   Procedure:  The patient was identified in the holding area and taken to room 8.  The patient was then placed supine on the table and prepped and draped in the usual sterile fashion.  A time out was called.  Ultrasound was used to evaluate the right common femoral artery.  It was patent .  A digital ultrasound image was acquired.  A micropuncture needle was used to access the right common femoral artery under ultrasound guidance.  An 018 wire was advanced without resistance and a micropuncture sheath was placed.  The 018 wire was removed and a benson wire was placed.  The micropuncture sheath was exchanged for a 5 french sheath.  An omniflush catheter was advanced over the wire to the level of L-1.  An abdominal angiogram was obtained. Catheter was withdrawn to the level of the aortic bifurcation and bilateral lower extremity runoff was obtained. The above findings of significant calcification throughout her lower extremity vessels  was identified. Unfortunately tibial vessels were poorly identified. Because of this we used the Omni catheter Bentson wire to go up and over the bifurcation. We then used first core wire and straight flush catheter to select her popliteal artery at the level of the knee joint on the left. We then performed angiography of her tibial vessels demonstrate. Runoff to the level of the ankle reconstituting dorsalis pedis artery. With this no intervention was undertaken catheter wire were removed sheath will be pulled in postop holding area. Patient tolerated procedure well without immediate complication.   Thomasena Vandenheuvel C. Randie Heinzain, MD [facility] Office: [phone] Pager: [phone]

## 2016-07-06 ENCOUNTER — Encounter (HOSPITAL_COMMUNITY): Payer: Self-pay | Admitting: Vascular Surgery

## 2016-07-06 DIAGNOSIS — D631 Anemia in chronic kidney disease: Secondary | ICD-10-CM | POA: Diagnosis not present

## 2016-07-06 DIAGNOSIS — E1129 Type 2 diabetes mellitus with other diabetic kidney complication: Secondary | ICD-10-CM | POA: Diagnosis not present

## 2016-07-06 DIAGNOSIS — N2581 Secondary hyperparathyroidism of renal origin: Secondary | ICD-10-CM | POA: Diagnosis not present

## 2016-07-06 DIAGNOSIS — N186 End stage renal disease: Secondary | ICD-10-CM | POA: Diagnosis not present

## 2016-07-07 DIAGNOSIS — Z992 Dependence on renal dialysis: Secondary | ICD-10-CM | POA: Diagnosis not present

## 2016-07-07 DIAGNOSIS — E1129 Type 2 diabetes mellitus with other diabetic kidney complication: Secondary | ICD-10-CM | POA: Diagnosis not present

## 2016-07-07 DIAGNOSIS — N186 End stage renal disease: Secondary | ICD-10-CM | POA: Diagnosis not present

## 2016-07-08 DIAGNOSIS — D631 Anemia in chronic kidney disease: Secondary | ICD-10-CM | POA: Diagnosis not present

## 2016-07-08 DIAGNOSIS — N2581 Secondary hyperparathyroidism of renal origin: Secondary | ICD-10-CM | POA: Diagnosis not present

## 2016-07-08 DIAGNOSIS — E1129 Type 2 diabetes mellitus with other diabetic kidney complication: Secondary | ICD-10-CM | POA: Diagnosis not present

## 2016-07-08 DIAGNOSIS — Z283 Underimmunization status: Secondary | ICD-10-CM | POA: Diagnosis not present

## 2016-07-08 DIAGNOSIS — N186 End stage renal disease: Secondary | ICD-10-CM | POA: Diagnosis not present

## 2016-07-10 DIAGNOSIS — E1129 Type 2 diabetes mellitus with other diabetic kidney complication: Secondary | ICD-10-CM | POA: Diagnosis not present

## 2016-07-10 DIAGNOSIS — Z283 Underimmunization status: Secondary | ICD-10-CM | POA: Diagnosis not present

## 2016-07-10 DIAGNOSIS — N186 End stage renal disease: Secondary | ICD-10-CM | POA: Diagnosis not present

## 2016-07-10 DIAGNOSIS — D631 Anemia in chronic kidney disease: Secondary | ICD-10-CM | POA: Diagnosis not present

## 2016-07-10 DIAGNOSIS — N2581 Secondary hyperparathyroidism of renal origin: Secondary | ICD-10-CM | POA: Diagnosis not present

## 2016-07-13 DIAGNOSIS — D631 Anemia in chronic kidney disease: Secondary | ICD-10-CM | POA: Diagnosis not present

## 2016-07-13 DIAGNOSIS — E1129 Type 2 diabetes mellitus with other diabetic kidney complication: Secondary | ICD-10-CM | POA: Diagnosis not present

## 2016-07-13 DIAGNOSIS — N186 End stage renal disease: Secondary | ICD-10-CM | POA: Diagnosis not present

## 2016-07-13 DIAGNOSIS — N2581 Secondary hyperparathyroidism of renal origin: Secondary | ICD-10-CM | POA: Diagnosis not present

## 2016-07-13 DIAGNOSIS — Z283 Underimmunization status: Secondary | ICD-10-CM | POA: Diagnosis not present

## 2016-07-15 DIAGNOSIS — N2581 Secondary hyperparathyroidism of renal origin: Secondary | ICD-10-CM | POA: Diagnosis not present

## 2016-07-15 DIAGNOSIS — E1129 Type 2 diabetes mellitus with other diabetic kidney complication: Secondary | ICD-10-CM | POA: Diagnosis not present

## 2016-07-15 DIAGNOSIS — Z283 Underimmunization status: Secondary | ICD-10-CM | POA: Diagnosis not present

## 2016-07-15 DIAGNOSIS — N186 End stage renal disease: Secondary | ICD-10-CM | POA: Diagnosis not present

## 2016-07-15 DIAGNOSIS — D631 Anemia in chronic kidney disease: Secondary | ICD-10-CM | POA: Diagnosis not present

## 2016-07-17 DIAGNOSIS — N2581 Secondary hyperparathyroidism of renal origin: Secondary | ICD-10-CM | POA: Diagnosis not present

## 2016-07-17 DIAGNOSIS — Z283 Underimmunization status: Secondary | ICD-10-CM | POA: Diagnosis not present

## 2016-07-17 DIAGNOSIS — N186 End stage renal disease: Secondary | ICD-10-CM | POA: Diagnosis not present

## 2016-07-17 DIAGNOSIS — E1129 Type 2 diabetes mellitus with other diabetic kidney complication: Secondary | ICD-10-CM | POA: Diagnosis not present

## 2016-07-17 DIAGNOSIS — D631 Anemia in chronic kidney disease: Secondary | ICD-10-CM | POA: Diagnosis not present

## 2016-07-20 DIAGNOSIS — N186 End stage renal disease: Secondary | ICD-10-CM | POA: Diagnosis not present

## 2016-07-20 DIAGNOSIS — D631 Anemia in chronic kidney disease: Secondary | ICD-10-CM | POA: Diagnosis not present

## 2016-07-20 DIAGNOSIS — E1129 Type 2 diabetes mellitus with other diabetic kidney complication: Secondary | ICD-10-CM | POA: Diagnosis not present

## 2016-07-20 DIAGNOSIS — Z283 Underimmunization status: Secondary | ICD-10-CM | POA: Diagnosis not present

## 2016-07-20 DIAGNOSIS — N2581 Secondary hyperparathyroidism of renal origin: Secondary | ICD-10-CM | POA: Diagnosis not present

## 2016-07-22 DIAGNOSIS — D631 Anemia in chronic kidney disease: Secondary | ICD-10-CM | POA: Diagnosis not present

## 2016-07-22 DIAGNOSIS — E1129 Type 2 diabetes mellitus with other diabetic kidney complication: Secondary | ICD-10-CM | POA: Diagnosis not present

## 2016-07-22 DIAGNOSIS — Z283 Underimmunization status: Secondary | ICD-10-CM | POA: Diagnosis not present

## 2016-07-22 DIAGNOSIS — N2581 Secondary hyperparathyroidism of renal origin: Secondary | ICD-10-CM | POA: Diagnosis not present

## 2016-07-22 DIAGNOSIS — N186 End stage renal disease: Secondary | ICD-10-CM | POA: Diagnosis not present

## 2016-07-24 DIAGNOSIS — E1129 Type 2 diabetes mellitus with other diabetic kidney complication: Secondary | ICD-10-CM | POA: Diagnosis not present

## 2016-07-24 DIAGNOSIS — Z283 Underimmunization status: Secondary | ICD-10-CM | POA: Diagnosis not present

## 2016-07-24 DIAGNOSIS — N186 End stage renal disease: Secondary | ICD-10-CM | POA: Diagnosis not present

## 2016-07-24 DIAGNOSIS — D631 Anemia in chronic kidney disease: Secondary | ICD-10-CM | POA: Diagnosis not present

## 2016-07-24 DIAGNOSIS — N2581 Secondary hyperparathyroidism of renal origin: Secondary | ICD-10-CM | POA: Diagnosis not present

## 2016-07-27 DIAGNOSIS — D631 Anemia in chronic kidney disease: Secondary | ICD-10-CM | POA: Diagnosis not present

## 2016-07-27 DIAGNOSIS — N186 End stage renal disease: Secondary | ICD-10-CM | POA: Diagnosis not present

## 2016-07-27 DIAGNOSIS — E1129 Type 2 diabetes mellitus with other diabetic kidney complication: Secondary | ICD-10-CM | POA: Diagnosis not present

## 2016-07-27 DIAGNOSIS — Z283 Underimmunization status: Secondary | ICD-10-CM | POA: Diagnosis not present

## 2016-07-27 DIAGNOSIS — N2581 Secondary hyperparathyroidism of renal origin: Secondary | ICD-10-CM | POA: Diagnosis not present

## 2016-07-29 DIAGNOSIS — N186 End stage renal disease: Secondary | ICD-10-CM | POA: Diagnosis not present

## 2016-07-29 DIAGNOSIS — Z283 Underimmunization status: Secondary | ICD-10-CM | POA: Diagnosis not present

## 2016-07-29 DIAGNOSIS — E1129 Type 2 diabetes mellitus with other diabetic kidney complication: Secondary | ICD-10-CM | POA: Diagnosis not present

## 2016-07-29 DIAGNOSIS — D631 Anemia in chronic kidney disease: Secondary | ICD-10-CM | POA: Diagnosis not present

## 2016-07-29 DIAGNOSIS — N2581 Secondary hyperparathyroidism of renal origin: Secondary | ICD-10-CM | POA: Diagnosis not present

## 2016-07-31 DIAGNOSIS — D631 Anemia in chronic kidney disease: Secondary | ICD-10-CM | POA: Diagnosis not present

## 2016-07-31 DIAGNOSIS — Z283 Underimmunization status: Secondary | ICD-10-CM | POA: Diagnosis not present

## 2016-07-31 DIAGNOSIS — N186 End stage renal disease: Secondary | ICD-10-CM | POA: Diagnosis not present

## 2016-07-31 DIAGNOSIS — E1129 Type 2 diabetes mellitus with other diabetic kidney complication: Secondary | ICD-10-CM | POA: Diagnosis not present

## 2016-07-31 DIAGNOSIS — N2581 Secondary hyperparathyroidism of renal origin: Secondary | ICD-10-CM | POA: Diagnosis not present

## 2016-08-03 DIAGNOSIS — Z283 Underimmunization status: Secondary | ICD-10-CM | POA: Diagnosis not present

## 2016-08-03 DIAGNOSIS — D631 Anemia in chronic kidney disease: Secondary | ICD-10-CM | POA: Diagnosis not present

## 2016-08-03 DIAGNOSIS — E1129 Type 2 diabetes mellitus with other diabetic kidney complication: Secondary | ICD-10-CM | POA: Diagnosis not present

## 2016-08-03 DIAGNOSIS — N186 End stage renal disease: Secondary | ICD-10-CM | POA: Diagnosis not present

## 2016-08-03 DIAGNOSIS — N2581 Secondary hyperparathyroidism of renal origin: Secondary | ICD-10-CM | POA: Diagnosis not present

## 2016-08-05 DIAGNOSIS — E1129 Type 2 diabetes mellitus with other diabetic kidney complication: Secondary | ICD-10-CM | POA: Diagnosis not present

## 2016-08-05 DIAGNOSIS — N2581 Secondary hyperparathyroidism of renal origin: Secondary | ICD-10-CM | POA: Diagnosis not present

## 2016-08-05 DIAGNOSIS — Z283 Underimmunization status: Secondary | ICD-10-CM | POA: Diagnosis not present

## 2016-08-05 DIAGNOSIS — N186 End stage renal disease: Secondary | ICD-10-CM | POA: Diagnosis not present

## 2016-08-05 DIAGNOSIS — D631 Anemia in chronic kidney disease: Secondary | ICD-10-CM | POA: Diagnosis not present

## 2016-08-07 DIAGNOSIS — E1129 Type 2 diabetes mellitus with other diabetic kidney complication: Secondary | ICD-10-CM | POA: Diagnosis not present

## 2016-08-07 DIAGNOSIS — D631 Anemia in chronic kidney disease: Secondary | ICD-10-CM | POA: Diagnosis not present

## 2016-08-07 DIAGNOSIS — N2581 Secondary hyperparathyroidism of renal origin: Secondary | ICD-10-CM | POA: Diagnosis not present

## 2016-08-07 DIAGNOSIS — Z992 Dependence on renal dialysis: Secondary | ICD-10-CM | POA: Diagnosis not present

## 2016-08-07 DIAGNOSIS — Z283 Underimmunization status: Secondary | ICD-10-CM | POA: Diagnosis not present

## 2016-08-07 DIAGNOSIS — N186 End stage renal disease: Secondary | ICD-10-CM | POA: Diagnosis not present

## 2016-08-10 DIAGNOSIS — N186 End stage renal disease: Secondary | ICD-10-CM | POA: Diagnosis not present

## 2016-08-10 DIAGNOSIS — N2581 Secondary hyperparathyroidism of renal origin: Secondary | ICD-10-CM | POA: Diagnosis not present

## 2016-08-10 DIAGNOSIS — E1129 Type 2 diabetes mellitus with other diabetic kidney complication: Secondary | ICD-10-CM | POA: Diagnosis not present

## 2016-08-10 DIAGNOSIS — D631 Anemia in chronic kidney disease: Secondary | ICD-10-CM | POA: Diagnosis not present

## 2016-08-12 DIAGNOSIS — N186 End stage renal disease: Secondary | ICD-10-CM | POA: Diagnosis not present

## 2016-08-12 DIAGNOSIS — N2581 Secondary hyperparathyroidism of renal origin: Secondary | ICD-10-CM | POA: Diagnosis not present

## 2016-08-12 DIAGNOSIS — D631 Anemia in chronic kidney disease: Secondary | ICD-10-CM | POA: Diagnosis not present

## 2016-08-12 DIAGNOSIS — E1129 Type 2 diabetes mellitus with other diabetic kidney complication: Secondary | ICD-10-CM | POA: Diagnosis not present

## 2016-08-14 DIAGNOSIS — E1129 Type 2 diabetes mellitus with other diabetic kidney complication: Secondary | ICD-10-CM | POA: Diagnosis not present

## 2016-08-14 DIAGNOSIS — N186 End stage renal disease: Secondary | ICD-10-CM | POA: Diagnosis not present

## 2016-08-14 DIAGNOSIS — N2581 Secondary hyperparathyroidism of renal origin: Secondary | ICD-10-CM | POA: Diagnosis not present

## 2016-08-14 DIAGNOSIS — D631 Anemia in chronic kidney disease: Secondary | ICD-10-CM | POA: Diagnosis not present

## 2016-08-17 DIAGNOSIS — N186 End stage renal disease: Secondary | ICD-10-CM | POA: Diagnosis not present

## 2016-08-17 DIAGNOSIS — E1129 Type 2 diabetes mellitus with other diabetic kidney complication: Secondary | ICD-10-CM | POA: Diagnosis not present

## 2016-08-17 DIAGNOSIS — N2581 Secondary hyperparathyroidism of renal origin: Secondary | ICD-10-CM | POA: Diagnosis not present

## 2016-08-17 DIAGNOSIS — D631 Anemia in chronic kidney disease: Secondary | ICD-10-CM | POA: Diagnosis not present

## 2016-08-19 DIAGNOSIS — D631 Anemia in chronic kidney disease: Secondary | ICD-10-CM | POA: Diagnosis not present

## 2016-08-19 DIAGNOSIS — N186 End stage renal disease: Secondary | ICD-10-CM | POA: Diagnosis not present

## 2016-08-19 DIAGNOSIS — E1129 Type 2 diabetes mellitus with other diabetic kidney complication: Secondary | ICD-10-CM | POA: Diagnosis not present

## 2016-08-19 DIAGNOSIS — N2581 Secondary hyperparathyroidism of renal origin: Secondary | ICD-10-CM | POA: Diagnosis not present

## 2016-08-20 ENCOUNTER — Ambulatory Visit (INDEPENDENT_AMBULATORY_CARE_PROVIDER_SITE_OTHER): Payer: Medicare Other | Admitting: Podiatry

## 2016-08-20 DIAGNOSIS — E1151 Type 2 diabetes mellitus with diabetic peripheral angiopathy without gangrene: Secondary | ICD-10-CM

## 2016-08-20 DIAGNOSIS — B351 Tinea unguium: Secondary | ICD-10-CM

## 2016-08-20 DIAGNOSIS — L84 Corns and callosities: Secondary | ICD-10-CM

## 2016-08-20 DIAGNOSIS — I739 Peripheral vascular disease, unspecified: Secondary | ICD-10-CM | POA: Diagnosis not present

## 2016-08-20 DIAGNOSIS — L97511 Non-pressure chronic ulcer of other part of right foot limited to breakdown of skin: Secondary | ICD-10-CM | POA: Diagnosis not present

## 2016-08-20 DIAGNOSIS — M79675 Pain in left toe(s): Secondary | ICD-10-CM | POA: Diagnosis not present

## 2016-08-20 DIAGNOSIS — M79676 Pain in unspecified toe(s): Secondary | ICD-10-CM

## 2016-08-20 DIAGNOSIS — Q828 Other specified congenital malformations of skin: Secondary | ICD-10-CM | POA: Diagnosis not present

## 2016-08-20 DIAGNOSIS — M1711 Unilateral primary osteoarthritis, right knee: Secondary | ICD-10-CM | POA: Diagnosis not present

## 2016-08-20 DIAGNOSIS — M7051 Other bursitis of knee, right knee: Secondary | ICD-10-CM | POA: Diagnosis not present

## 2016-08-21 DIAGNOSIS — E1129 Type 2 diabetes mellitus with other diabetic kidney complication: Secondary | ICD-10-CM | POA: Diagnosis not present

## 2016-08-21 DIAGNOSIS — N2581 Secondary hyperparathyroidism of renal origin: Secondary | ICD-10-CM | POA: Diagnosis not present

## 2016-08-21 DIAGNOSIS — D631 Anemia in chronic kidney disease: Secondary | ICD-10-CM | POA: Diagnosis not present

## 2016-08-21 DIAGNOSIS — N186 End stage renal disease: Secondary | ICD-10-CM | POA: Diagnosis not present

## 2016-08-23 NOTE — Progress Notes (Signed)
Patient ID: [name], female   DOB: 1932-08-25, 83 y.o.   MRN: [id number]  Subjective: 83 y.o. returns the office today for painful, elongated, thickened toenails and calluses which she cannot trim herself. She also has callus and cons to her feet. Denies any redness or drainage around the nails/calluses. Denies any acute changes since last appointment and no new complaints today. Denies any systemic complaints such as fevers, chills, nausea, vomiting.   Objective: AAO 3, NAD DP/PT pulses decreased Protective sensation decreased with Simms Weinstein monofilament Nails hypertrophic, dystrophic, elongated, brittle, discolored 9. There is tenderness overlying the nails 2-5 on the left and 1-5 on the right. There is no surrounding erythema or drainage along the nail sites. Hyperkeratotic lesions bilateral foot submetatarsal 1 and 5 and lateral 5th digit on the left and right foot. Upon debridement there was a superficial ulcer presnet underneath the right fifth digit hyperkeratotic lesion. There is no drainage or pus. There is no probing to bone, undermining or tunneling. There is no edema, erythema, increase in warmth to the toe. There is no ulceration, drainage underneath the other hyperkeratotic lesions.  No other open lesions or pre-ulcerative lesions are identified. The wound on the left 5th toe has resolved. No underlying ulcer after debridement of the callus.  Previous partial auto-amputation of the left hallux which is healed at this time. No pain with calf compression, swelling, warmth, erythema.  Assessment: Patient presents with symptomatic onychomycosis; pre-ulcerative calluses ; wound right fifth toe with healed ulcer on the left 5th toe.   Plan: -Treatment options including alternatives, risks, complications were discussed -Nails sharply debrided 9 without complication/bleeding. -Hyperkeratotic lesion sharply debrided  3 without complications/bleeding -Hyperkeratotic lesion  right fifth toe was debrided to reveal underlying ulceration. Continue with Iodosorb dressing changes daily and she has this at home. Monitor his signs or symptoms of worsening infection of the ER should any occur. -Discussed daily foot inspection. If there are any changes, to call the office immediately.  -Follow-up as scheduled or sooner if any problems are to arise. In the meantime, encouraged to call the office with any questions, concerns, changes symptoms.  Ovid Curd, DPM

## 2016-08-24 DIAGNOSIS — D631 Anemia in chronic kidney disease: Secondary | ICD-10-CM | POA: Diagnosis not present

## 2016-08-24 DIAGNOSIS — N186 End stage renal disease: Secondary | ICD-10-CM | POA: Diagnosis not present

## 2016-08-24 DIAGNOSIS — E1129 Type 2 diabetes mellitus with other diabetic kidney complication: Secondary | ICD-10-CM | POA: Diagnosis not present

## 2016-08-24 DIAGNOSIS — N2581 Secondary hyperparathyroidism of renal origin: Secondary | ICD-10-CM | POA: Diagnosis not present

## 2016-08-26 DIAGNOSIS — N186 End stage renal disease: Secondary | ICD-10-CM | POA: Diagnosis not present

## 2016-08-26 DIAGNOSIS — E1129 Type 2 diabetes mellitus with other diabetic kidney complication: Secondary | ICD-10-CM | POA: Diagnosis not present

## 2016-08-26 DIAGNOSIS — N2581 Secondary hyperparathyroidism of renal origin: Secondary | ICD-10-CM | POA: Diagnosis not present

## 2016-08-26 DIAGNOSIS — D631 Anemia in chronic kidney disease: Secondary | ICD-10-CM | POA: Diagnosis not present

## 2016-08-28 DIAGNOSIS — E1129 Type 2 diabetes mellitus with other diabetic kidney complication: Secondary | ICD-10-CM | POA: Diagnosis not present

## 2016-08-28 DIAGNOSIS — N2581 Secondary hyperparathyroidism of renal origin: Secondary | ICD-10-CM | POA: Diagnosis not present

## 2016-08-28 DIAGNOSIS — D631 Anemia in chronic kidney disease: Secondary | ICD-10-CM | POA: Diagnosis not present

## 2016-08-28 DIAGNOSIS — N186 End stage renal disease: Secondary | ICD-10-CM | POA: Diagnosis not present

## 2016-08-31 DIAGNOSIS — E1129 Type 2 diabetes mellitus with other diabetic kidney complication: Secondary | ICD-10-CM | POA: Diagnosis not present

## 2016-08-31 DIAGNOSIS — D631 Anemia in chronic kidney disease: Secondary | ICD-10-CM | POA: Diagnosis not present

## 2016-08-31 DIAGNOSIS — N186 End stage renal disease: Secondary | ICD-10-CM | POA: Diagnosis not present

## 2016-08-31 DIAGNOSIS — N2581 Secondary hyperparathyroidism of renal origin: Secondary | ICD-10-CM | POA: Diagnosis not present

## 2016-09-02 DIAGNOSIS — E1129 Type 2 diabetes mellitus with other diabetic kidney complication: Secondary | ICD-10-CM | POA: Diagnosis not present

## 2016-09-02 DIAGNOSIS — N186 End stage renal disease: Secondary | ICD-10-CM | POA: Diagnosis not present

## 2016-09-02 DIAGNOSIS — N2581 Secondary hyperparathyroidism of renal origin: Secondary | ICD-10-CM | POA: Diagnosis not present

## 2016-09-02 DIAGNOSIS — D631 Anemia in chronic kidney disease: Secondary | ICD-10-CM | POA: Diagnosis not present

## 2016-09-03 ENCOUNTER — Encounter: Payer: Self-pay | Admitting: Podiatry

## 2016-09-03 ENCOUNTER — Ambulatory Visit (INDEPENDENT_AMBULATORY_CARE_PROVIDER_SITE_OTHER): Payer: Medicare Other | Admitting: Podiatry

## 2016-09-03 DIAGNOSIS — L601 Onycholysis: Secondary | ICD-10-CM | POA: Diagnosis not present

## 2016-09-03 DIAGNOSIS — E1151 Type 2 diabetes mellitus with diabetic peripheral angiopathy without gangrene: Secondary | ICD-10-CM | POA: Diagnosis not present

## 2016-09-03 DIAGNOSIS — I739 Peripheral vascular disease, unspecified: Secondary | ICD-10-CM | POA: Diagnosis not present

## 2016-09-03 DIAGNOSIS — L97511 Non-pressure chronic ulcer of other part of right foot limited to breakdown of skin: Secondary | ICD-10-CM | POA: Diagnosis not present

## 2016-09-04 DIAGNOSIS — D631 Anemia in chronic kidney disease: Secondary | ICD-10-CM | POA: Diagnosis not present

## 2016-09-04 DIAGNOSIS — N2581 Secondary hyperparathyroidism of renal origin: Secondary | ICD-10-CM | POA: Diagnosis not present

## 2016-09-04 DIAGNOSIS — E1129 Type 2 diabetes mellitus with other diabetic kidney complication: Secondary | ICD-10-CM | POA: Diagnosis not present

## 2016-09-04 DIAGNOSIS — N186 End stage renal disease: Secondary | ICD-10-CM | POA: Diagnosis not present

## 2016-09-06 DIAGNOSIS — Z992 Dependence on renal dialysis: Secondary | ICD-10-CM | POA: Diagnosis not present

## 2016-09-06 DIAGNOSIS — N186 End stage renal disease: Secondary | ICD-10-CM | POA: Diagnosis not present

## 2016-09-06 DIAGNOSIS — E1129 Type 2 diabetes mellitus with other diabetic kidney complication: Secondary | ICD-10-CM | POA: Diagnosis not present

## 2016-09-06 NOTE — Progress Notes (Signed)
Subjective: 83 year old female presents the office with her grandson for follow-up evaluation of wound to right fifth toe. She states that she is doing better. She denies any drainage or pus. Denies any swelling to her feet. Denies any redness or warmth. Denies any systemic complaints such as fevers, chills, nausea, vomiting. No acute changes since last appointment, and no other complaints at this time.   Objective: AAO x3, NAD DP/PT pulses decreased bilaterally Wound on the lateral aspect of the right fifth toe with a hyperkeratotic lesion. Upon debridement the wound does appear to be improved with discontinued. The wound measures 0.3 x 0.2 cm in a superficial without any probing, undermining or tunneling. There is no swelling edema, erythema, increase in warmth. This no fluctuance, crepitus, malodor. The left fourth digit toenail looks like is almost completely off. I further debrided this toenail female did come off but there is no underlying wound identified. Also the right hallux toenail is loose and I debrided the majority of the nail but there is no ongoing ulceration, drainage or any clinical signs of infection. No other open lesions or pre-ulcerative lesions.  No pain with calf compression, swelling, warmth, erythema  Assessment: Healing wound right fifth toe, onycholysis left fourth, right hallux toenails  Plan: -All treatment options discussed with the patient including all alternatives, risks, complications.  -Wound to the right fifth toe was debrided to any complications or bleeding. Recommend continue with daily dressing changes with antibiotic ointment. -Debrided the 2 nails with onycholysis without any complications or bleeding. Continue to monitor. -Follow-up as scheduled or sooner if needed. -Patient encouraged to call the office with any questions, concerns, change in symptoms.   Ovid Curd, DPM

## 2016-09-07 DIAGNOSIS — N186 End stage renal disease: Secondary | ICD-10-CM | POA: Diagnosis not present

## 2016-09-07 DIAGNOSIS — D509 Iron deficiency anemia, unspecified: Secondary | ICD-10-CM | POA: Diagnosis not present

## 2016-09-07 DIAGNOSIS — D631 Anemia in chronic kidney disease: Secondary | ICD-10-CM | POA: Diagnosis not present

## 2016-09-07 DIAGNOSIS — E1129 Type 2 diabetes mellitus with other diabetic kidney complication: Secondary | ICD-10-CM | POA: Diagnosis not present

## 2016-09-07 DIAGNOSIS — N2581 Secondary hyperparathyroidism of renal origin: Secondary | ICD-10-CM | POA: Diagnosis not present

## 2016-09-08 ENCOUNTER — Ambulatory Visit: Payer: Medicare Other | Attending: Orthopedic Surgery | Admitting: Physical Therapy

## 2016-09-08 ENCOUNTER — Encounter: Payer: Self-pay | Admitting: Physical Therapy

## 2016-09-08 DIAGNOSIS — M25561 Pain in right knee: Secondary | ICD-10-CM | POA: Diagnosis not present

## 2016-09-08 DIAGNOSIS — R262 Difficulty in walking, not elsewhere classified: Secondary | ICD-10-CM | POA: Insufficient documentation

## 2016-09-08 DIAGNOSIS — M6281 Muscle weakness (generalized): Secondary | ICD-10-CM | POA: Diagnosis not present

## 2016-09-08 NOTE — Patient Instructions (Addendum)
   Extend lower leg until stretch is felt Hold 20 seconds Repeat 3 times on each LE     Lift top leg up and down 10 times Perform 2 times a day      Place towel roll under knee and push knee into towel roll, holding 5 seconds each Repeat 10-20 times Perform 2 times a day

## 2016-09-08 NOTE — Therapy (Addendum)
[place], [place], [address] Phone: [phone]   Fax:  [phone]  Physical Therapy Evaluation/Discharge Summary Patient Details  Name: [name] MRN: [id number] Date of Birth: [birth year] Referring Provider: Rod Can, MD  Encounter Date: 09/08/2016      PT End of Session - 09/08/16 1017    Visit Number 1   Number of Visits 12   Authorization Type Kx modifier at visit 15, G-codes   PT Start Time 1025   PT Stop Time 1115   PT Time Calculation (min) 50 min   Activity Tolerance Patient tolerated treatment well   Behavior During Therapy [facility] for tasks assessed/performed      Past Medical History:  Diagnosis Date  . Anemia   . C. difficile diarrhea   . Chronic kidney disease    on Dialysis  . Depression   . Diabetes mellitus   . Diabetic neuropathy ([place])   . GERD (gastroesophageal reflux disease)   . Gout   . Hyperparathyroidism   . MRSA (methicillin resistant staph aureus) culture positive   . Obesity   . Osteoarthritis   . Peripheral arterial disease (HCC)    Gangrene-Left Great Toe  . Shortness of breath dyspnea    with exertion  . Stroke [facility])     Past Surgical History:  Procedure Laterality Date  . ABDOMINAL AORTOGRAM N/A 07/05/2016   Procedure: Abdominal Aortogram;  Surgeon: Waynetta Sandy, MD;  Location: [place] [facility];  Service: Cardiovascular;  Laterality: N/A;  . ABDOMINAL HYSTERECTOMY    . AV FISTULA PLACEMENT Left 07/07/2015   Procedure: ATTEMPTED INSERTION OFLEFT  ARTERIOVENOUS (AV) GORE-TEX GRAFT THIGH;  Surgeon: Conrad [place], MD;  Location: [place];  Service: Vascular;  Laterality: Left;  . BREAST BIOPSY Right   . COLONOSCOPY    . LOWER EXTREMITY ANGIOGRAPHY Bilateral 07/05/2016   Procedure: Lower Extremity Angiography;  Surgeon: Waynetta Sandy, MD;  Location: [place] [facility];  Service: Cardiovascular;  Laterality: Bilateral;  . MULTIPLE TOOTH  EXTRACTIONS    . SHUNTOGRAM Right 09/06/12   Thigh graft  . SP DECLOT AVGG Right Sep 14, 2012   Right thigh graft    There were no vitals filed for this visit.       Subjective Assessment - 09/08/16 1024    Subjective Patient arriving to therapy today reporting 10/10 pain in R knee with bending. Pt reporting a fall a few months ago falling on right side and pain has increased. Pt currently amb with a straight cane with antalgic gait.    Pertinent History Wound Right Foot, 5th digit   Limitations Standing;Walking;House hold activities   How long can you sit comfortably? unlimited   How long can you stand comfortably? 5 minutes   How long can you walk comfortably? 5 minutes   Currently in Pain? Yes   Pain Score 10-Worst pain ever   Pain Location Knee   Pain Orientation Right   Pain Descriptors / Indicators Aching;Sore   Pain Type Acute pain   Pain Onset More than a month ago   Pain Frequency Intermittent   Aggravating Factors  bending, walking, standing   Pain Relieving Factors resting, sitting   Effect of Pain on Daily Activities difficutly with bathing, dressing, Pt reporting she requires assistance with stepping up on a curb            [facility] PT Assessment - 09/08/16 0001      Assessment  Medical Diagnosis right knee pain, difficulty walking   Referring Provider Rod Can, MD   Hand Dominance Right   Next MD Visit 6 weeks   Prior Therapy yes, in rehab clinic following CVA     Precautions   Precautions None     Balance Screen   Has the patient fallen in the past 6 months Yes   How many times? 1   Has the patient had a decrease in activity level because of a fear of falling?  Yes   Is the patient reluctant to leave their home because of a fear of falling?  No     Home Social worker Private residence   Living Arrangements Alone   Available Help at Discharge Family   Type of [place] One  level   [place] - 2 wheels;Walker - 4 wheels;Cane - quad;Cane - single point;Bedside commode;Wheelchair - manual     Prior Function   Level of Independence Needs assistance with ADLs   Vocation Retired   Comments work Film/video editor   Overall Cognitive Status Within Functional Limits for tasks assessed     ROM / Strength   AROM / PROM / Strength Strength     Strength   Strength Assessment Site Hip;Knee   Right/Left Hip Right;Left   Right Hip Flexion 3/5   Right Hip Extension 3-/5   Right Hip ABduction 3/5   Right Hip ADduction 3/5   Left Hip Flexion 4-/5   Left Hip Extension 3/5   Left Hip ABduction 3+/5   Left Hip ADduction 3+/5   Right/Left Knee Right   Right Knee Flexion 3-/5   Right Knee Extension 3-/5     Transfers   Five time sit to stand comments  20.42 seconds using UE support     Balance   Balance Assessed Yes     Standardized Balance Assessment   Standardized Balance Assessment Berg Balance Test     Berg Balance Test   Sit to Stand Able to stand  independently using hands   Standing Unsupported Able to stand safely 2 minutes   Sitting with Back Unsupported but Feet Supported on Floor or Stool Able to sit safely and securely 2 minutes   Stand to Sit Controls descent by using hands   Transfers Able to transfer safely, definite need of hands   Standing Unsupported with Eyes Closed Able to stand 10 seconds with supervision   Standing Ubsupported with Feet Together Able to place feet together independently but unable to hold for 30 seconds   From Standing, Reach Forward with Outstretched Arm Can reach forward >5 cm safely (2")   From Standing Position, Pick up Object from Floor Unable to pick up shoe, but reaches 2-5 cm (1-2") from shoe and balances independently   From Standing Position, Turn to Look Behind Over each Shoulder Needs supervision when turning   Turn 360 Degrees Needs close supervision or verbal cueing   Standing Unsupported,  Alternately Place Feet on Step/Stool Needs assistance to keep from falling or unable to try   Standing Unsupported, One Foot in Front Able to take small step independently and hold 30 seconds   Standing on One Leg Unable to try or needs assist to prevent fall   Total Score 30  PT Education - 09/08/16 1016    Education provided Yes   Education Details HEP   Person(s) Educated Patient;Child(ren)  son   Methods Explanation;Demonstration;Tactile cues;Handout;Verbal cues   Comprehension Verbalized understanding;Returned demonstration          PT Short Term Goals - 09/08/16 1318      PT SHORT TERM GOAL #1   Title pt will be independent in her HEP   Time 3   Period Weeks   Status New           PT Long Term Goals - 09/08/16 1325      PT LONG TERM GOAL #1   Title Pt will be able to improve BERG balance score to >/= 38/56.    Baseline 09/08/16= 30/56   Time 6   Period Weeks   Status New     PT LONG TERM GOAL #2   Title Pt will be able to amb with a straight cane 500 feet on level surfaces with step through gait pattern safely.    Time 6   Period Weeks   Status New     PT LONG TERM GOAL #3   Title Pt will be able to step up and down off a curb with assistive device safely.    Time 6   Period Weeks   Status New     PT LONG TERM GOAL #4   Title Pt will improve her R knee flexion to >/= 115 degrees actively with pain less than/= 2/10.    Time 6   Period Weeks   Status New     PT LONG TERM GOAL #5   Title Pt will improve bilateral hip strength to >/= 4/5 in order to improve functional mobility and gait.    Time 6   Period Weeks   Status New               Plan - 09/08/16 1310    Clinical Impression Statement Patient arriving today as a low complexity evalaution with her son complaining of right knee pain which has worsened since a fall a couple of months ago. Pt repoting 10/10 knee pain with bending and reporting  walking and standing is difficult. Pt presenting with decreased strengh in her R LE and bilateral hips. Pt also with decreased dynamic balance. Pt currently with right 5th digit wound and is being followed by podiatrist. Pt amb with a straight cane that is too high, cane was adjusted to lowest position and was still too high for pt. Pt's son reporting she has another cane at home and she will bring it to her next session. Further gait training needed. Skilled PT needed to address pt's impairments with the below interventions.    Rehab Potential Good   Clinical Impairments Affecting Rehab Potential Dialysis, h/o CVA   PT Frequency 2x / week   PT Duration 6 weeks   PT Treatment/Interventions ADLs/Self Care Home Management;Gait training;Stair training;Functional mobility training;Therapeutic activities;Therapeutic exercise;Balance training;Neuromuscular re-education;Moist Heat;Electrical Stimulation;Cryotherapy;Iontophoresis 72m/ml Dexamethasone;Patient/family education;Manual techniques;Passive range of motion;Taping;Dry needling   PT Next Visit Plan Adjust pt's cane to correct height, Nustep, LE strengthening, stretching, dynamic balance, gait training, posture correction   PT Home Exercise Plan hamstring stretch, quad set, hip abduction supine and sitting, yellow theraband issued   Consulted and Agree with Plan of Care Patient;Family member/caregiver   Family Member Consulted son      Patient will benefit from skilled therapeutic intervention in order to improve the following deficits and impairments:  Abnormal gait, Postural dysfunction, Decreased range of motion, Decreased balance, Decreased mobility, Difficulty walking, Pain, Decreased strength, Decreased activity tolerance  Visit Diagnosis: Difficulty in walking, not elsewhere classified  Acute pain of right knee  Muscle weakness (generalized)      G-Codes - 10/08/16 1330    Functional Assessment Tool Used (Outpatient Only) clinical  assessment, BERG   Functional Limitation Mobility: Walking and moving around   Mobility: Walking and Moving Around Current Status (U3845) At least 40 percent but less than 60 percent impaired, limited or restricted   Mobility: Walking and Moving Around Goal Status [phone]) At least 40 percent but less than 60 percent impaired, limited or restricted       Problem List Patient Active Problem List   Diagnosis Date Noted  . PAD (peripheral artery disease) ([place]) 04/29/2015  . Atherosclerosis of native arteries of the extremities with ulceration ([place]) 01/10/2014  . CVA (cerebral infarction) 12/14/2013  . Dysphagia, unspecified(787.20) 11/28/2013  . Peripheral arterial disease ([place]) 10/26/2013  . End stage renal disease ([place]) 12/29/2012  . Sepsis due to Haemophilus influenzae ([place]) 11/07/2012  . Hyperkalemia 10/31/2012  . Diabetes mellitus ([place]) 10/31/2012  . Depression 10/31/2012  . Leukocytosis 10/31/2012  . Anemia 10/31/2012  . Acute respiratory distress 10/31/2012  . Pneumonia 10/31/2012  . C. difficile colitis 10/31/2012  . Chronic diarrhea 10/31/2012  . ESRD on dialysis [facility]) 10/06/2012    Oretha Caprice, MPT 10-08-2016, 1:33 PM  [facility] [address] [place], [place], [address] Phone: [phone]   Fax:  [phone]  Name: [name] MRN: [id number] Date of Birth: 1933-04-14   PHYSICAL THERAPY DISCHARGE SUMMARY  Visits from Start of Care: 1  Current functional level related to goals / functional outcomes: See above   Remaining deficits: See above   Education / Equipment:  HEP Plan: Patient agrees to discharge.  Patient goals were not met. Patient is being discharged due to not returning since the last visit.  ?????    Jessica C. Hightower PT, DPT 10/06/16 10:29 AM

## 2016-09-09 DIAGNOSIS — D509 Iron deficiency anemia, unspecified: Secondary | ICD-10-CM | POA: Diagnosis not present

## 2016-09-09 DIAGNOSIS — N186 End stage renal disease: Secondary | ICD-10-CM | POA: Diagnosis not present

## 2016-09-09 DIAGNOSIS — D631 Anemia in chronic kidney disease: Secondary | ICD-10-CM | POA: Diagnosis not present

## 2016-09-09 DIAGNOSIS — E1129 Type 2 diabetes mellitus with other diabetic kidney complication: Secondary | ICD-10-CM | POA: Diagnosis not present

## 2016-09-09 DIAGNOSIS — N2581 Secondary hyperparathyroidism of renal origin: Secondary | ICD-10-CM | POA: Diagnosis not present

## 2016-09-11 DIAGNOSIS — N186 End stage renal disease: Secondary | ICD-10-CM | POA: Diagnosis not present

## 2016-09-11 DIAGNOSIS — D509 Iron deficiency anemia, unspecified: Secondary | ICD-10-CM | POA: Diagnosis not present

## 2016-09-11 DIAGNOSIS — N2581 Secondary hyperparathyroidism of renal origin: Secondary | ICD-10-CM | POA: Diagnosis not present

## 2016-09-11 DIAGNOSIS — E1129 Type 2 diabetes mellitus with other diabetic kidney complication: Secondary | ICD-10-CM | POA: Diagnosis not present

## 2016-09-11 DIAGNOSIS — D631 Anemia in chronic kidney disease: Secondary | ICD-10-CM | POA: Diagnosis not present

## 2016-09-14 DIAGNOSIS — D509 Iron deficiency anemia, unspecified: Secondary | ICD-10-CM | POA: Diagnosis not present

## 2016-09-14 DIAGNOSIS — N186 End stage renal disease: Secondary | ICD-10-CM | POA: Diagnosis not present

## 2016-09-14 DIAGNOSIS — E1129 Type 2 diabetes mellitus with other diabetic kidney complication: Secondary | ICD-10-CM | POA: Diagnosis not present

## 2016-09-14 DIAGNOSIS — N2581 Secondary hyperparathyroidism of renal origin: Secondary | ICD-10-CM | POA: Diagnosis not present

## 2016-09-14 DIAGNOSIS — D631 Anemia in chronic kidney disease: Secondary | ICD-10-CM | POA: Diagnosis not present

## 2016-09-15 ENCOUNTER — Ambulatory Visit: Payer: Medicare Other | Admitting: Physical Therapy

## 2016-09-16 DIAGNOSIS — E1129 Type 2 diabetes mellitus with other diabetic kidney complication: Secondary | ICD-10-CM | POA: Diagnosis not present

## 2016-09-16 DIAGNOSIS — D631 Anemia in chronic kidney disease: Secondary | ICD-10-CM | POA: Diagnosis not present

## 2016-09-16 DIAGNOSIS — N186 End stage renal disease: Secondary | ICD-10-CM | POA: Diagnosis not present

## 2016-09-16 DIAGNOSIS — N2581 Secondary hyperparathyroidism of renal origin: Secondary | ICD-10-CM | POA: Diagnosis not present

## 2016-09-16 DIAGNOSIS — D509 Iron deficiency anemia, unspecified: Secondary | ICD-10-CM | POA: Diagnosis not present

## 2016-09-18 DIAGNOSIS — D631 Anemia in chronic kidney disease: Secondary | ICD-10-CM | POA: Diagnosis not present

## 2016-09-18 DIAGNOSIS — N2581 Secondary hyperparathyroidism of renal origin: Secondary | ICD-10-CM | POA: Diagnosis not present

## 2016-09-18 DIAGNOSIS — D509 Iron deficiency anemia, unspecified: Secondary | ICD-10-CM | POA: Diagnosis not present

## 2016-09-18 DIAGNOSIS — N186 End stage renal disease: Secondary | ICD-10-CM | POA: Diagnosis not present

## 2016-09-18 DIAGNOSIS — E1129 Type 2 diabetes mellitus with other diabetic kidney complication: Secondary | ICD-10-CM | POA: Diagnosis not present

## 2016-09-21 DIAGNOSIS — D631 Anemia in chronic kidney disease: Secondary | ICD-10-CM | POA: Diagnosis not present

## 2016-09-21 DIAGNOSIS — N2581 Secondary hyperparathyroidism of renal origin: Secondary | ICD-10-CM | POA: Diagnosis not present

## 2016-09-21 DIAGNOSIS — N186 End stage renal disease: Secondary | ICD-10-CM | POA: Diagnosis not present

## 2016-09-21 DIAGNOSIS — E1129 Type 2 diabetes mellitus with other diabetic kidney complication: Secondary | ICD-10-CM | POA: Diagnosis not present

## 2016-09-21 DIAGNOSIS — D509 Iron deficiency anemia, unspecified: Secondary | ICD-10-CM | POA: Diagnosis not present

## 2016-09-22 ENCOUNTER — Ambulatory Visit (INDEPENDENT_AMBULATORY_CARE_PROVIDER_SITE_OTHER): Payer: Medicare Other | Admitting: Podiatry

## 2016-09-22 ENCOUNTER — Encounter: Payer: Self-pay | Admitting: Podiatry

## 2016-09-22 ENCOUNTER — Telehealth: Payer: Self-pay | Admitting: Physical Therapy

## 2016-09-22 ENCOUNTER — Ambulatory Visit: Payer: Medicare Other | Admitting: Physical Therapy

## 2016-09-22 DIAGNOSIS — L84 Corns and callosities: Secondary | ICD-10-CM

## 2016-09-22 DIAGNOSIS — M2041 Other hammer toe(s) (acquired), right foot: Secondary | ICD-10-CM | POA: Diagnosis not present

## 2016-09-22 DIAGNOSIS — M79676 Pain in unspecified toe(s): Secondary | ICD-10-CM

## 2016-09-22 NOTE — Telephone Encounter (Signed)
Pt cell phone (preferred number) goes straight to voicemail but VM has not been set up. Left message with Vivi FernsM. Harris (listed as emergency contact) stating that Ms Luiz BlareGraves has NS for her last two appointments and I will leave her next appointment on the schedule, she will need to schedule one appointment at a time. Requested call back. Tashica Provencio C. [place] PT, DPT 09/22/16 10:47 AM

## 2016-09-23 DIAGNOSIS — E1129 Type 2 diabetes mellitus with other diabetic kidney complication: Secondary | ICD-10-CM | POA: Diagnosis not present

## 2016-09-23 DIAGNOSIS — D631 Anemia in chronic kidney disease: Secondary | ICD-10-CM | POA: Diagnosis not present

## 2016-09-23 DIAGNOSIS — N186 End stage renal disease: Secondary | ICD-10-CM | POA: Diagnosis not present

## 2016-09-23 DIAGNOSIS — N2581 Secondary hyperparathyroidism of renal origin: Secondary | ICD-10-CM | POA: Diagnosis not present

## 2016-09-23 DIAGNOSIS — D509 Iron deficiency anemia, unspecified: Secondary | ICD-10-CM | POA: Diagnosis not present

## 2016-09-24 ENCOUNTER — Ambulatory Visit: Payer: Medicare Other | Admitting: Podiatry

## 2016-09-25 DIAGNOSIS — D631 Anemia in chronic kidney disease: Secondary | ICD-10-CM | POA: Diagnosis not present

## 2016-09-25 DIAGNOSIS — D509 Iron deficiency anemia, unspecified: Secondary | ICD-10-CM | POA: Diagnosis not present

## 2016-09-25 DIAGNOSIS — N186 End stage renal disease: Secondary | ICD-10-CM | POA: Diagnosis not present

## 2016-09-25 DIAGNOSIS — E1129 Type 2 diabetes mellitus with other diabetic kidney complication: Secondary | ICD-10-CM | POA: Diagnosis not present

## 2016-09-25 DIAGNOSIS — N2581 Secondary hyperparathyroidism of renal origin: Secondary | ICD-10-CM | POA: Diagnosis not present

## 2016-09-27 NOTE — Progress Notes (Signed)
Subjective:    Patient ID: [name], female   DOB: 83 y.o.   MRN: [id number]   HPI patient presents to have her fifth digit checked right stating that it is no longer draining like it was but does have keratotic tissue and can be irritated and certain shoe gear    ROS      Objective:  Physical Exam Neurovascular status intact with continued keratotic tissue formation fifth digit right that's localized in nature with no proximal edema erythema or drainage noted    Assessment:    History of ulceration with digit digital disease that is stable with keratotic lesion formation that is no longer draining     Plan:    Reviewed condition and importance of wider-type shoes and soft leather and debrided the lesion applied padding to take pressure off of it and explained the usage of padding and what to do if any drainage were to occur and to reappoint immediately and does understand that amputation is still a long-term possibility for condition

## 2016-09-28 DIAGNOSIS — N2581 Secondary hyperparathyroidism of renal origin: Secondary | ICD-10-CM | POA: Diagnosis not present

## 2016-09-28 DIAGNOSIS — D509 Iron deficiency anemia, unspecified: Secondary | ICD-10-CM | POA: Diagnosis not present

## 2016-09-28 DIAGNOSIS — N186 End stage renal disease: Secondary | ICD-10-CM | POA: Diagnosis not present

## 2016-09-28 DIAGNOSIS — E1129 Type 2 diabetes mellitus with other diabetic kidney complication: Secondary | ICD-10-CM | POA: Diagnosis not present

## 2016-09-28 DIAGNOSIS — D631 Anemia in chronic kidney disease: Secondary | ICD-10-CM | POA: Diagnosis not present

## 2016-09-29 ENCOUNTER — Ambulatory Visit: Payer: Medicare Other | Admitting: Physical Therapy

## 2016-09-30 ENCOUNTER — Telehealth: Payer: Self-pay | Admitting: *Deleted

## 2016-09-30 DIAGNOSIS — D631 Anemia in chronic kidney disease: Secondary | ICD-10-CM | POA: Diagnosis not present

## 2016-09-30 DIAGNOSIS — N186 End stage renal disease: Secondary | ICD-10-CM | POA: Diagnosis not present

## 2016-09-30 DIAGNOSIS — N2581 Secondary hyperparathyroidism of renal origin: Secondary | ICD-10-CM | POA: Diagnosis not present

## 2016-09-30 DIAGNOSIS — D509 Iron deficiency anemia, unspecified: Secondary | ICD-10-CM | POA: Diagnosis not present

## 2016-09-30 DIAGNOSIS — E1129 Type 2 diabetes mellitus with other diabetic kidney complication: Secondary | ICD-10-CM | POA: Diagnosis not present

## 2016-09-30 NOTE — Telephone Encounter (Signed)
Tried to call patient on preferred number no voicemail set up home number disconnected on me.  Diabetic shoe paperwork is on hold until patient makes an appointment with her PCP per the PCP.

## 2016-10-02 DIAGNOSIS — D509 Iron deficiency anemia, unspecified: Secondary | ICD-10-CM | POA: Diagnosis not present

## 2016-10-02 DIAGNOSIS — D631 Anemia in chronic kidney disease: Secondary | ICD-10-CM | POA: Diagnosis not present

## 2016-10-02 DIAGNOSIS — N186 End stage renal disease: Secondary | ICD-10-CM | POA: Diagnosis not present

## 2016-10-02 DIAGNOSIS — E1129 Type 2 diabetes mellitus with other diabetic kidney complication: Secondary | ICD-10-CM | POA: Diagnosis not present

## 2016-10-02 DIAGNOSIS — N2581 Secondary hyperparathyroidism of renal origin: Secondary | ICD-10-CM | POA: Diagnosis not present

## 2016-10-05 ENCOUNTER — Encounter (HOSPITAL_COMMUNITY): Payer: Self-pay | Admitting: *Deleted

## 2016-10-05 DIAGNOSIS — E1129 Type 2 diabetes mellitus with other diabetic kidney complication: Secondary | ICD-10-CM | POA: Diagnosis not present

## 2016-10-05 DIAGNOSIS — N2581 Secondary hyperparathyroidism of renal origin: Secondary | ICD-10-CM | POA: Diagnosis not present

## 2016-10-05 DIAGNOSIS — D509 Iron deficiency anemia, unspecified: Secondary | ICD-10-CM | POA: Diagnosis not present

## 2016-10-05 DIAGNOSIS — D631 Anemia in chronic kidney disease: Secondary | ICD-10-CM | POA: Diagnosis not present

## 2016-10-05 DIAGNOSIS — N186 End stage renal disease: Secondary | ICD-10-CM | POA: Diagnosis not present

## 2016-10-06 ENCOUNTER — Ambulatory Visit: Payer: Medicare Other | Admitting: Physical Therapy

## 2016-10-06 DIAGNOSIS — G8929 Other chronic pain: Secondary | ICD-10-CM | POA: Diagnosis not present

## 2016-10-06 DIAGNOSIS — M25561 Pain in right knee: Secondary | ICD-10-CM | POA: Diagnosis not present

## 2016-10-06 DIAGNOSIS — M1711 Unilateral primary osteoarthritis, right knee: Secondary | ICD-10-CM | POA: Diagnosis not present

## 2016-10-06 DIAGNOSIS — M1712 Unilateral primary osteoarthritis, left knee: Secondary | ICD-10-CM | POA: Diagnosis not present

## 2016-10-07 DIAGNOSIS — D631 Anemia in chronic kidney disease: Secondary | ICD-10-CM | POA: Diagnosis not present

## 2016-10-07 DIAGNOSIS — N2581 Secondary hyperparathyroidism of renal origin: Secondary | ICD-10-CM | POA: Diagnosis not present

## 2016-10-07 DIAGNOSIS — N186 End stage renal disease: Secondary | ICD-10-CM | POA: Diagnosis not present

## 2016-10-07 DIAGNOSIS — E1129 Type 2 diabetes mellitus with other diabetic kidney complication: Secondary | ICD-10-CM | POA: Diagnosis not present

## 2016-10-07 DIAGNOSIS — D509 Iron deficiency anemia, unspecified: Secondary | ICD-10-CM | POA: Diagnosis not present

## 2016-10-07 DIAGNOSIS — Z992 Dependence on renal dialysis: Secondary | ICD-10-CM | POA: Diagnosis not present

## 2016-10-09 DIAGNOSIS — N186 End stage renal disease: Secondary | ICD-10-CM | POA: Diagnosis not present

## 2016-10-09 DIAGNOSIS — N2581 Secondary hyperparathyroidism of renal origin: Secondary | ICD-10-CM | POA: Diagnosis not present

## 2016-10-09 DIAGNOSIS — E1129 Type 2 diabetes mellitus with other diabetic kidney complication: Secondary | ICD-10-CM | POA: Diagnosis not present

## 2016-10-09 DIAGNOSIS — D509 Iron deficiency anemia, unspecified: Secondary | ICD-10-CM | POA: Diagnosis not present

## 2016-10-09 DIAGNOSIS — D631 Anemia in chronic kidney disease: Secondary | ICD-10-CM | POA: Diagnosis not present

## 2016-10-12 DIAGNOSIS — D631 Anemia in chronic kidney disease: Secondary | ICD-10-CM | POA: Diagnosis not present

## 2016-10-12 DIAGNOSIS — D509 Iron deficiency anemia, unspecified: Secondary | ICD-10-CM | POA: Diagnosis not present

## 2016-10-12 DIAGNOSIS — N2581 Secondary hyperparathyroidism of renal origin: Secondary | ICD-10-CM | POA: Diagnosis not present

## 2016-10-12 DIAGNOSIS — E1129 Type 2 diabetes mellitus with other diabetic kidney complication: Secondary | ICD-10-CM | POA: Diagnosis not present

## 2016-10-12 DIAGNOSIS — N186 End stage renal disease: Secondary | ICD-10-CM | POA: Diagnosis not present

## 2016-10-13 ENCOUNTER — Ambulatory Visit: Payer: Medicare Other | Admitting: Physical Therapy

## 2016-10-13 DIAGNOSIS — M25562 Pain in left knee: Secondary | ICD-10-CM | POA: Diagnosis not present

## 2016-10-13 DIAGNOSIS — G8929 Other chronic pain: Secondary | ICD-10-CM | POA: Diagnosis not present

## 2016-10-14 DIAGNOSIS — D509 Iron deficiency anemia, unspecified: Secondary | ICD-10-CM | POA: Diagnosis not present

## 2016-10-14 DIAGNOSIS — E1129 Type 2 diabetes mellitus with other diabetic kidney complication: Secondary | ICD-10-CM | POA: Diagnosis not present

## 2016-10-14 DIAGNOSIS — N2581 Secondary hyperparathyroidism of renal origin: Secondary | ICD-10-CM | POA: Diagnosis not present

## 2016-10-14 DIAGNOSIS — D631 Anemia in chronic kidney disease: Secondary | ICD-10-CM | POA: Diagnosis not present

## 2016-10-14 DIAGNOSIS — N186 End stage renal disease: Secondary | ICD-10-CM | POA: Diagnosis not present

## 2016-10-16 DIAGNOSIS — D631 Anemia in chronic kidney disease: Secondary | ICD-10-CM | POA: Diagnosis not present

## 2016-10-16 DIAGNOSIS — N186 End stage renal disease: Secondary | ICD-10-CM | POA: Diagnosis not present

## 2016-10-16 DIAGNOSIS — D509 Iron deficiency anemia, unspecified: Secondary | ICD-10-CM | POA: Diagnosis not present

## 2016-10-16 DIAGNOSIS — N2581 Secondary hyperparathyroidism of renal origin: Secondary | ICD-10-CM | POA: Diagnosis not present

## 2016-10-16 DIAGNOSIS — E1129 Type 2 diabetes mellitus with other diabetic kidney complication: Secondary | ICD-10-CM | POA: Diagnosis not present

## 2016-10-19 DIAGNOSIS — E1129 Type 2 diabetes mellitus with other diabetic kidney complication: Secondary | ICD-10-CM | POA: Diagnosis not present

## 2016-10-19 DIAGNOSIS — N2581 Secondary hyperparathyroidism of renal origin: Secondary | ICD-10-CM | POA: Diagnosis not present

## 2016-10-19 DIAGNOSIS — D631 Anemia in chronic kidney disease: Secondary | ICD-10-CM | POA: Diagnosis not present

## 2016-10-19 DIAGNOSIS — D509 Iron deficiency anemia, unspecified: Secondary | ICD-10-CM | POA: Diagnosis not present

## 2016-10-19 DIAGNOSIS — N186 End stage renal disease: Secondary | ICD-10-CM | POA: Diagnosis not present

## 2016-10-20 ENCOUNTER — Ambulatory Visit: Payer: Medicare Other | Admitting: Physical Therapy

## 2016-10-21 DIAGNOSIS — E1129 Type 2 diabetes mellitus with other diabetic kidney complication: Secondary | ICD-10-CM | POA: Diagnosis not present

## 2016-10-21 DIAGNOSIS — N186 End stage renal disease: Secondary | ICD-10-CM | POA: Diagnosis not present

## 2016-10-21 DIAGNOSIS — D509 Iron deficiency anemia, unspecified: Secondary | ICD-10-CM | POA: Diagnosis not present

## 2016-10-21 DIAGNOSIS — N2581 Secondary hyperparathyroidism of renal origin: Secondary | ICD-10-CM | POA: Diagnosis not present

## 2016-10-21 DIAGNOSIS — D631 Anemia in chronic kidney disease: Secondary | ICD-10-CM | POA: Diagnosis not present

## 2016-10-22 ENCOUNTER — Ambulatory Visit: Payer: Medicare Other | Admitting: Podiatry

## 2016-10-23 DIAGNOSIS — D509 Iron deficiency anemia, unspecified: Secondary | ICD-10-CM | POA: Diagnosis not present

## 2016-10-23 DIAGNOSIS — D631 Anemia in chronic kidney disease: Secondary | ICD-10-CM | POA: Diagnosis not present

## 2016-10-23 DIAGNOSIS — N186 End stage renal disease: Secondary | ICD-10-CM | POA: Diagnosis not present

## 2016-10-23 DIAGNOSIS — E1129 Type 2 diabetes mellitus with other diabetic kidney complication: Secondary | ICD-10-CM | POA: Diagnosis not present

## 2016-10-23 DIAGNOSIS — N2581 Secondary hyperparathyroidism of renal origin: Secondary | ICD-10-CM | POA: Diagnosis not present

## 2016-10-26 DIAGNOSIS — N2581 Secondary hyperparathyroidism of renal origin: Secondary | ICD-10-CM | POA: Diagnosis not present

## 2016-10-26 DIAGNOSIS — N186 End stage renal disease: Secondary | ICD-10-CM | POA: Diagnosis not present

## 2016-10-26 DIAGNOSIS — E1129 Type 2 diabetes mellitus with other diabetic kidney complication: Secondary | ICD-10-CM | POA: Diagnosis not present

## 2016-10-26 DIAGNOSIS — D509 Iron deficiency anemia, unspecified: Secondary | ICD-10-CM | POA: Diagnosis not present

## 2016-10-26 DIAGNOSIS — D631 Anemia in chronic kidney disease: Secondary | ICD-10-CM | POA: Diagnosis not present

## 2016-10-27 ENCOUNTER — Ambulatory Visit: Payer: Medicare Other | Admitting: Physical Therapy

## 2016-10-28 DIAGNOSIS — D631 Anemia in chronic kidney disease: Secondary | ICD-10-CM | POA: Diagnosis not present

## 2016-10-28 DIAGNOSIS — D509 Iron deficiency anemia, unspecified: Secondary | ICD-10-CM | POA: Diagnosis not present

## 2016-10-28 DIAGNOSIS — N186 End stage renal disease: Secondary | ICD-10-CM | POA: Diagnosis not present

## 2016-10-28 DIAGNOSIS — E1129 Type 2 diabetes mellitus with other diabetic kidney complication: Secondary | ICD-10-CM | POA: Diagnosis not present

## 2016-10-28 DIAGNOSIS — N2581 Secondary hyperparathyroidism of renal origin: Secondary | ICD-10-CM | POA: Diagnosis not present

## 2016-10-30 DIAGNOSIS — E1129 Type 2 diabetes mellitus with other diabetic kidney complication: Secondary | ICD-10-CM | POA: Diagnosis not present

## 2016-10-30 DIAGNOSIS — D631 Anemia in chronic kidney disease: Secondary | ICD-10-CM | POA: Diagnosis not present

## 2016-10-30 DIAGNOSIS — N2581 Secondary hyperparathyroidism of renal origin: Secondary | ICD-10-CM | POA: Diagnosis not present

## 2016-10-30 DIAGNOSIS — D509 Iron deficiency anemia, unspecified: Secondary | ICD-10-CM | POA: Diagnosis not present

## 2016-10-30 DIAGNOSIS — N186 End stage renal disease: Secondary | ICD-10-CM | POA: Diagnosis not present

## 2016-11-02 DIAGNOSIS — D509 Iron deficiency anemia, unspecified: Secondary | ICD-10-CM | POA: Diagnosis not present

## 2016-11-02 DIAGNOSIS — E1129 Type 2 diabetes mellitus with other diabetic kidney complication: Secondary | ICD-10-CM | POA: Diagnosis not present

## 2016-11-02 DIAGNOSIS — N2581 Secondary hyperparathyroidism of renal origin: Secondary | ICD-10-CM | POA: Diagnosis not present

## 2016-11-02 DIAGNOSIS — N186 End stage renal disease: Secondary | ICD-10-CM | POA: Diagnosis not present

## 2016-11-02 DIAGNOSIS — D631 Anemia in chronic kidney disease: Secondary | ICD-10-CM | POA: Diagnosis not present

## 2016-11-03 ENCOUNTER — Encounter: Payer: Medicare Other | Admitting: Physical Therapy

## 2016-11-04 DIAGNOSIS — D509 Iron deficiency anemia, unspecified: Secondary | ICD-10-CM | POA: Diagnosis not present

## 2016-11-04 DIAGNOSIS — D631 Anemia in chronic kidney disease: Secondary | ICD-10-CM | POA: Diagnosis not present

## 2016-11-04 DIAGNOSIS — N2581 Secondary hyperparathyroidism of renal origin: Secondary | ICD-10-CM | POA: Diagnosis not present

## 2016-11-04 DIAGNOSIS — N186 End stage renal disease: Secondary | ICD-10-CM | POA: Diagnosis not present

## 2016-11-04 DIAGNOSIS — E1129 Type 2 diabetes mellitus with other diabetic kidney complication: Secondary | ICD-10-CM | POA: Diagnosis not present

## 2016-11-06 DIAGNOSIS — N186 End stage renal disease: Secondary | ICD-10-CM | POA: Diagnosis not present

## 2016-11-06 DIAGNOSIS — N2581 Secondary hyperparathyroidism of renal origin: Secondary | ICD-10-CM | POA: Diagnosis not present

## 2016-11-06 DIAGNOSIS — Z992 Dependence on renal dialysis: Secondary | ICD-10-CM | POA: Diagnosis not present

## 2016-11-06 DIAGNOSIS — D509 Iron deficiency anemia, unspecified: Secondary | ICD-10-CM | POA: Diagnosis not present

## 2016-11-06 DIAGNOSIS — E1129 Type 2 diabetes mellitus with other diabetic kidney complication: Secondary | ICD-10-CM | POA: Diagnosis not present

## 2016-11-06 DIAGNOSIS — D631 Anemia in chronic kidney disease: Secondary | ICD-10-CM | POA: Diagnosis not present

## 2016-11-09 DIAGNOSIS — D631 Anemia in chronic kidney disease: Secondary | ICD-10-CM | POA: Diagnosis not present

## 2016-11-09 DIAGNOSIS — N186 End stage renal disease: Secondary | ICD-10-CM | POA: Diagnosis not present

## 2016-11-09 DIAGNOSIS — N2581 Secondary hyperparathyroidism of renal origin: Secondary | ICD-10-CM | POA: Diagnosis not present

## 2016-11-09 DIAGNOSIS — E1129 Type 2 diabetes mellitus with other diabetic kidney complication: Secondary | ICD-10-CM | POA: Diagnosis not present

## 2016-11-11 DIAGNOSIS — N186 End stage renal disease: Secondary | ICD-10-CM | POA: Diagnosis not present

## 2016-11-11 DIAGNOSIS — N2581 Secondary hyperparathyroidism of renal origin: Secondary | ICD-10-CM | POA: Diagnosis not present

## 2016-11-11 DIAGNOSIS — D631 Anemia in chronic kidney disease: Secondary | ICD-10-CM | POA: Diagnosis not present

## 2016-11-11 DIAGNOSIS — E1129 Type 2 diabetes mellitus with other diabetic kidney complication: Secondary | ICD-10-CM | POA: Diagnosis not present

## 2016-11-13 DIAGNOSIS — N186 End stage renal disease: Secondary | ICD-10-CM | POA: Diagnosis not present

## 2016-11-13 DIAGNOSIS — N2581 Secondary hyperparathyroidism of renal origin: Secondary | ICD-10-CM | POA: Diagnosis not present

## 2016-11-13 DIAGNOSIS — D631 Anemia in chronic kidney disease: Secondary | ICD-10-CM | POA: Diagnosis not present

## 2016-11-13 DIAGNOSIS — E1129 Type 2 diabetes mellitus with other diabetic kidney complication: Secondary | ICD-10-CM | POA: Diagnosis not present

## 2016-11-16 DIAGNOSIS — N2581 Secondary hyperparathyroidism of renal origin: Secondary | ICD-10-CM | POA: Diagnosis not present

## 2016-11-16 DIAGNOSIS — N186 End stage renal disease: Secondary | ICD-10-CM | POA: Diagnosis not present

## 2016-11-16 DIAGNOSIS — E1129 Type 2 diabetes mellitus with other diabetic kidney complication: Secondary | ICD-10-CM | POA: Diagnosis not present

## 2016-11-16 DIAGNOSIS — D631 Anemia in chronic kidney disease: Secondary | ICD-10-CM | POA: Diagnosis not present

## 2016-11-18 DIAGNOSIS — E1129 Type 2 diabetes mellitus with other diabetic kidney complication: Secondary | ICD-10-CM | POA: Diagnosis not present

## 2016-11-18 DIAGNOSIS — N186 End stage renal disease: Secondary | ICD-10-CM | POA: Diagnosis not present

## 2016-11-18 DIAGNOSIS — N2581 Secondary hyperparathyroidism of renal origin: Secondary | ICD-10-CM | POA: Diagnosis not present

## 2016-11-18 DIAGNOSIS — D631 Anemia in chronic kidney disease: Secondary | ICD-10-CM | POA: Diagnosis not present

## 2016-11-20 DIAGNOSIS — N186 End stage renal disease: Secondary | ICD-10-CM | POA: Diagnosis not present

## 2016-11-20 DIAGNOSIS — E1129 Type 2 diabetes mellitus with other diabetic kidney complication: Secondary | ICD-10-CM | POA: Diagnosis not present

## 2016-11-20 DIAGNOSIS — D631 Anemia in chronic kidney disease: Secondary | ICD-10-CM | POA: Diagnosis not present

## 2016-11-20 DIAGNOSIS — N2581 Secondary hyperparathyroidism of renal origin: Secondary | ICD-10-CM | POA: Diagnosis not present

## 2016-11-23 DIAGNOSIS — N186 End stage renal disease: Secondary | ICD-10-CM | POA: Diagnosis not present

## 2016-11-23 DIAGNOSIS — N2581 Secondary hyperparathyroidism of renal origin: Secondary | ICD-10-CM | POA: Diagnosis not present

## 2016-11-23 DIAGNOSIS — E1129 Type 2 diabetes mellitus with other diabetic kidney complication: Secondary | ICD-10-CM | POA: Diagnosis not present

## 2016-11-23 DIAGNOSIS — D631 Anemia in chronic kidney disease: Secondary | ICD-10-CM | POA: Diagnosis not present

## 2016-11-25 DIAGNOSIS — N186 End stage renal disease: Secondary | ICD-10-CM | POA: Diagnosis not present

## 2016-11-25 DIAGNOSIS — E1129 Type 2 diabetes mellitus with other diabetic kidney complication: Secondary | ICD-10-CM | POA: Diagnosis not present

## 2016-11-25 DIAGNOSIS — N2581 Secondary hyperparathyroidism of renal origin: Secondary | ICD-10-CM | POA: Diagnosis not present

## 2016-11-25 DIAGNOSIS — D631 Anemia in chronic kidney disease: Secondary | ICD-10-CM | POA: Diagnosis not present

## 2016-11-27 DIAGNOSIS — D631 Anemia in chronic kidney disease: Secondary | ICD-10-CM | POA: Diagnosis not present

## 2016-11-27 DIAGNOSIS — N2581 Secondary hyperparathyroidism of renal origin: Secondary | ICD-10-CM | POA: Diagnosis not present

## 2016-11-27 DIAGNOSIS — E1129 Type 2 diabetes mellitus with other diabetic kidney complication: Secondary | ICD-10-CM | POA: Diagnosis not present

## 2016-11-27 DIAGNOSIS — N186 End stage renal disease: Secondary | ICD-10-CM | POA: Diagnosis not present

## 2016-11-30 DIAGNOSIS — N2581 Secondary hyperparathyroidism of renal origin: Secondary | ICD-10-CM | POA: Diagnosis not present

## 2016-11-30 DIAGNOSIS — E1129 Type 2 diabetes mellitus with other diabetic kidney complication: Secondary | ICD-10-CM | POA: Diagnosis not present

## 2016-11-30 DIAGNOSIS — N186 End stage renal disease: Secondary | ICD-10-CM | POA: Diagnosis not present

## 2016-11-30 DIAGNOSIS — D631 Anemia in chronic kidney disease: Secondary | ICD-10-CM | POA: Diagnosis not present

## 2016-12-02 DIAGNOSIS — D631 Anemia in chronic kidney disease: Secondary | ICD-10-CM | POA: Diagnosis not present

## 2016-12-02 DIAGNOSIS — N186 End stage renal disease: Secondary | ICD-10-CM | POA: Diagnosis not present

## 2016-12-02 DIAGNOSIS — E1129 Type 2 diabetes mellitus with other diabetic kidney complication: Secondary | ICD-10-CM | POA: Diagnosis not present

## 2016-12-02 DIAGNOSIS — N2581 Secondary hyperparathyroidism of renal origin: Secondary | ICD-10-CM | POA: Diagnosis not present

## 2016-12-04 DIAGNOSIS — E1129 Type 2 diabetes mellitus with other diabetic kidney complication: Secondary | ICD-10-CM | POA: Diagnosis not present

## 2016-12-04 DIAGNOSIS — N2581 Secondary hyperparathyroidism of renal origin: Secondary | ICD-10-CM | POA: Diagnosis not present

## 2016-12-04 DIAGNOSIS — N186 End stage renal disease: Secondary | ICD-10-CM | POA: Diagnosis not present

## 2016-12-04 DIAGNOSIS — D631 Anemia in chronic kidney disease: Secondary | ICD-10-CM | POA: Diagnosis not present

## 2016-12-07 DIAGNOSIS — N186 End stage renal disease: Secondary | ICD-10-CM | POA: Diagnosis not present

## 2016-12-07 DIAGNOSIS — N2581 Secondary hyperparathyroidism of renal origin: Secondary | ICD-10-CM | POA: Diagnosis not present

## 2016-12-07 DIAGNOSIS — D631 Anemia in chronic kidney disease: Secondary | ICD-10-CM | POA: Diagnosis not present

## 2016-12-07 DIAGNOSIS — E1129 Type 2 diabetes mellitus with other diabetic kidney complication: Secondary | ICD-10-CM | POA: Diagnosis not present

## 2016-12-07 DIAGNOSIS — Z992 Dependence on renal dialysis: Secondary | ICD-10-CM | POA: Diagnosis not present

## 2016-12-09 DIAGNOSIS — N186 End stage renal disease: Secondary | ICD-10-CM | POA: Diagnosis not present

## 2016-12-09 DIAGNOSIS — E1129 Type 2 diabetes mellitus with other diabetic kidney complication: Secondary | ICD-10-CM | POA: Diagnosis not present

## 2016-12-09 DIAGNOSIS — D631 Anemia in chronic kidney disease: Secondary | ICD-10-CM | POA: Diagnosis not present

## 2016-12-09 DIAGNOSIS — E875 Hyperkalemia: Secondary | ICD-10-CM | POA: Diagnosis not present

## 2016-12-09 DIAGNOSIS — N2581 Secondary hyperparathyroidism of renal origin: Secondary | ICD-10-CM | POA: Diagnosis not present

## 2016-12-11 DIAGNOSIS — E1129 Type 2 diabetes mellitus with other diabetic kidney complication: Secondary | ICD-10-CM | POA: Diagnosis not present

## 2016-12-11 DIAGNOSIS — N186 End stage renal disease: Secondary | ICD-10-CM | POA: Diagnosis not present

## 2016-12-11 DIAGNOSIS — N2581 Secondary hyperparathyroidism of renal origin: Secondary | ICD-10-CM | POA: Diagnosis not present

## 2016-12-11 DIAGNOSIS — D631 Anemia in chronic kidney disease: Secondary | ICD-10-CM | POA: Diagnosis not present

## 2016-12-11 DIAGNOSIS — E875 Hyperkalemia: Secondary | ICD-10-CM | POA: Diagnosis not present

## 2016-12-13 ENCOUNTER — Ambulatory Visit (INDEPENDENT_AMBULATORY_CARE_PROVIDER_SITE_OTHER): Payer: Medicare Other | Admitting: Podiatry

## 2016-12-13 ENCOUNTER — Encounter: Payer: Self-pay | Admitting: Podiatry

## 2016-12-13 DIAGNOSIS — E1151 Type 2 diabetes mellitus with diabetic peripheral angiopathy without gangrene: Secondary | ICD-10-CM

## 2016-12-13 DIAGNOSIS — M79676 Pain in unspecified toe(s): Secondary | ICD-10-CM | POA: Diagnosis not present

## 2016-12-13 DIAGNOSIS — I739 Peripheral vascular disease, unspecified: Secondary | ICD-10-CM | POA: Diagnosis not present

## 2016-12-13 DIAGNOSIS — B351 Tinea unguium: Secondary | ICD-10-CM | POA: Diagnosis not present

## 2016-12-13 DIAGNOSIS — L97511 Non-pressure chronic ulcer of other part of right foot limited to breakdown of skin: Secondary | ICD-10-CM | POA: Diagnosis not present

## 2016-12-13 DIAGNOSIS — L84 Corns and callosities: Secondary | ICD-10-CM

## 2016-12-14 DIAGNOSIS — E875 Hyperkalemia: Secondary | ICD-10-CM | POA: Diagnosis not present

## 2016-12-14 DIAGNOSIS — D631 Anemia in chronic kidney disease: Secondary | ICD-10-CM | POA: Diagnosis not present

## 2016-12-14 DIAGNOSIS — E1129 Type 2 diabetes mellitus with other diabetic kidney complication: Secondary | ICD-10-CM | POA: Diagnosis not present

## 2016-12-14 DIAGNOSIS — N2581 Secondary hyperparathyroidism of renal origin: Secondary | ICD-10-CM | POA: Diagnosis not present

## 2016-12-14 DIAGNOSIS — N186 End stage renal disease: Secondary | ICD-10-CM | POA: Diagnosis not present

## 2016-12-14 NOTE — Progress Notes (Signed)
Patient ID: Jamie LippsBetty G [name], female   DOB: 12/20/1932, 83 y.o.   MRN: [id number]  Subjective: 83 y.o. returns the office today for painful, elongated, thickened toenails and calluses which she cannot trim herself. She also has callus and corns to her feet. She denies any redness or drainage around the nails/calluses. Denies any acute changes since last appointment and no new complaints today. Denies any systemic complaints such as fevers, chills, nausea, vomiting.   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  Nails hypertrophic, dystrophic, elongated, brittle, discolored 9. There is tenderness overlying the nails 2-5 on the left and 1-5 on the right. There is no surrounding erythema or drainage along the nail sites. Hyperkeratotic lesions bilateral foot submetatarsal 1 and 5 and lateral 5th digit on the left and right foot.  Upon debridement on the right fifth toe is a very small superficial abrasion type lesion without any drainage or pus expressed. There is no edema to the tip is no erythema or increase in warmth. There is no fluctuance, crepitus, malodor. There is no clinical signs of infection. No pain with calf compression, swelling, warmth, erythema.  Assessment: Patient presents with symptomatic onychomycosis; pre-ulcerative calluses ; wound right fifth toe   Plan: -Treatment options including alternatives, risks, complications were discussed -Nails sharply debrided 9 without complication/bleeding. -Hyperkeratotic lesion sharply debrided  3 without complications/bleeding -Superficial wound in the right fifth toe. Equinus, and buttock ointment and a bandage daily. Monitor for any signs or symptoms of infection. Nonhealed in 2 weeks recommend follow-up. -Follow-up as scheduled or sooner if any problems are to arise. In the meantime, encouraged to call the office with  any questions, concerns, changes symptoms.  Ovid CurdMatthew Samaiyah Howes, DPM

## 2016-12-16 DIAGNOSIS — N186 End stage renal disease: Secondary | ICD-10-CM | POA: Diagnosis not present

## 2016-12-16 DIAGNOSIS — N2581 Secondary hyperparathyroidism of renal origin: Secondary | ICD-10-CM | POA: Diagnosis not present

## 2016-12-16 DIAGNOSIS — E875 Hyperkalemia: Secondary | ICD-10-CM | POA: Diagnosis not present

## 2016-12-16 DIAGNOSIS — D631 Anemia in chronic kidney disease: Secondary | ICD-10-CM | POA: Diagnosis not present

## 2016-12-16 DIAGNOSIS — E1129 Type 2 diabetes mellitus with other diabetic kidney complication: Secondary | ICD-10-CM | POA: Diagnosis not present

## 2016-12-18 DIAGNOSIS — E1129 Type 2 diabetes mellitus with other diabetic kidney complication: Secondary | ICD-10-CM | POA: Diagnosis not present

## 2016-12-18 DIAGNOSIS — D631 Anemia in chronic kidney disease: Secondary | ICD-10-CM | POA: Diagnosis not present

## 2016-12-18 DIAGNOSIS — N2581 Secondary hyperparathyroidism of renal origin: Secondary | ICD-10-CM | POA: Diagnosis not present

## 2016-12-18 DIAGNOSIS — N186 End stage renal disease: Secondary | ICD-10-CM | POA: Diagnosis not present

## 2016-12-18 DIAGNOSIS — E875 Hyperkalemia: Secondary | ICD-10-CM | POA: Diagnosis not present

## 2016-12-20 DIAGNOSIS — M899 Disorder of bone, unspecified: Secondary | ICD-10-CM | POA: Diagnosis not present

## 2016-12-20 DIAGNOSIS — N2581 Secondary hyperparathyroidism of renal origin: Secondary | ICD-10-CM | POA: Diagnosis not present

## 2016-12-20 DIAGNOSIS — I5042 Chronic combined systolic (congestive) and diastolic (congestive) heart failure: Secondary | ICD-10-CM | POA: Diagnosis not present

## 2016-12-20 DIAGNOSIS — I10 Essential (primary) hypertension: Secondary | ICD-10-CM | POA: Diagnosis not present

## 2016-12-20 DIAGNOSIS — Z992 Dependence on renal dialysis: Secondary | ICD-10-CM | POA: Diagnosis not present

## 2016-12-20 DIAGNOSIS — D509 Iron deficiency anemia, unspecified: Secondary | ICD-10-CM | POA: Diagnosis not present

## 2016-12-20 DIAGNOSIS — E1122 Type 2 diabetes mellitus with diabetic chronic kidney disease: Secondary | ICD-10-CM | POA: Diagnosis not present

## 2016-12-20 DIAGNOSIS — L84 Corns and callosities: Secondary | ICD-10-CM | POA: Diagnosis not present

## 2016-12-20 DIAGNOSIS — E113299 Type 2 diabetes mellitus with mild nonproliferative diabetic retinopathy without macular edema, unspecified eye: Secondary | ICD-10-CM | POA: Diagnosis not present

## 2016-12-20 DIAGNOSIS — N186 End stage renal disease: Secondary | ICD-10-CM | POA: Diagnosis not present

## 2016-12-20 DIAGNOSIS — J309 Allergic rhinitis, unspecified: Secondary | ICD-10-CM | POA: Diagnosis not present

## 2016-12-20 DIAGNOSIS — E1140 Type 2 diabetes mellitus with diabetic neuropathy, unspecified: Secondary | ICD-10-CM | POA: Diagnosis not present

## 2016-12-21 DIAGNOSIS — N186 End stage renal disease: Secondary | ICD-10-CM | POA: Diagnosis not present

## 2016-12-21 DIAGNOSIS — E1129 Type 2 diabetes mellitus with other diabetic kidney complication: Secondary | ICD-10-CM | POA: Diagnosis not present

## 2016-12-21 DIAGNOSIS — N2581 Secondary hyperparathyroidism of renal origin: Secondary | ICD-10-CM | POA: Diagnosis not present

## 2016-12-21 DIAGNOSIS — D631 Anemia in chronic kidney disease: Secondary | ICD-10-CM | POA: Diagnosis not present

## 2016-12-21 DIAGNOSIS — E875 Hyperkalemia: Secondary | ICD-10-CM | POA: Diagnosis not present

## 2016-12-23 DIAGNOSIS — D631 Anemia in chronic kidney disease: Secondary | ICD-10-CM | POA: Diagnosis not present

## 2016-12-23 DIAGNOSIS — E875 Hyperkalemia: Secondary | ICD-10-CM | POA: Diagnosis not present

## 2016-12-23 DIAGNOSIS — N186 End stage renal disease: Secondary | ICD-10-CM | POA: Diagnosis not present

## 2016-12-23 DIAGNOSIS — N2581 Secondary hyperparathyroidism of renal origin: Secondary | ICD-10-CM | POA: Diagnosis not present

## 2016-12-23 DIAGNOSIS — E1129 Type 2 diabetes mellitus with other diabetic kidney complication: Secondary | ICD-10-CM | POA: Diagnosis not present

## 2016-12-25 DIAGNOSIS — E875 Hyperkalemia: Secondary | ICD-10-CM | POA: Diagnosis not present

## 2016-12-25 DIAGNOSIS — N186 End stage renal disease: Secondary | ICD-10-CM | POA: Diagnosis not present

## 2016-12-25 DIAGNOSIS — N2581 Secondary hyperparathyroidism of renal origin: Secondary | ICD-10-CM | POA: Diagnosis not present

## 2016-12-25 DIAGNOSIS — E1129 Type 2 diabetes mellitus with other diabetic kidney complication: Secondary | ICD-10-CM | POA: Diagnosis not present

## 2016-12-25 DIAGNOSIS — D631 Anemia in chronic kidney disease: Secondary | ICD-10-CM | POA: Diagnosis not present

## 2016-12-28 DIAGNOSIS — E875 Hyperkalemia: Secondary | ICD-10-CM | POA: Diagnosis not present

## 2016-12-28 DIAGNOSIS — N186 End stage renal disease: Secondary | ICD-10-CM | POA: Diagnosis not present

## 2016-12-28 DIAGNOSIS — E1129 Type 2 diabetes mellitus with other diabetic kidney complication: Secondary | ICD-10-CM | POA: Diagnosis not present

## 2016-12-28 DIAGNOSIS — N2581 Secondary hyperparathyroidism of renal origin: Secondary | ICD-10-CM | POA: Diagnosis not present

## 2016-12-28 DIAGNOSIS — D631 Anemia in chronic kidney disease: Secondary | ICD-10-CM | POA: Diagnosis not present

## 2016-12-30 DIAGNOSIS — D631 Anemia in chronic kidney disease: Secondary | ICD-10-CM | POA: Diagnosis not present

## 2016-12-30 DIAGNOSIS — N2581 Secondary hyperparathyroidism of renal origin: Secondary | ICD-10-CM | POA: Diagnosis not present

## 2016-12-30 DIAGNOSIS — E1129 Type 2 diabetes mellitus with other diabetic kidney complication: Secondary | ICD-10-CM | POA: Diagnosis not present

## 2016-12-30 DIAGNOSIS — N186 End stage renal disease: Secondary | ICD-10-CM | POA: Diagnosis not present

## 2016-12-30 DIAGNOSIS — E875 Hyperkalemia: Secondary | ICD-10-CM | POA: Diagnosis not present

## 2016-12-31 DIAGNOSIS — M1711 Unilateral primary osteoarthritis, right knee: Secondary | ICD-10-CM | POA: Diagnosis not present

## 2017-01-01 DIAGNOSIS — N186 End stage renal disease: Secondary | ICD-10-CM | POA: Diagnosis not present

## 2017-01-01 DIAGNOSIS — N2581 Secondary hyperparathyroidism of renal origin: Secondary | ICD-10-CM | POA: Diagnosis not present

## 2017-01-01 DIAGNOSIS — D631 Anemia in chronic kidney disease: Secondary | ICD-10-CM | POA: Diagnosis not present

## 2017-01-01 DIAGNOSIS — E875 Hyperkalemia: Secondary | ICD-10-CM | POA: Diagnosis not present

## 2017-01-01 DIAGNOSIS — E1129 Type 2 diabetes mellitus with other diabetic kidney complication: Secondary | ICD-10-CM | POA: Diagnosis not present

## 2017-01-04 DIAGNOSIS — E1129 Type 2 diabetes mellitus with other diabetic kidney complication: Secondary | ICD-10-CM | POA: Diagnosis not present

## 2017-01-04 DIAGNOSIS — D631 Anemia in chronic kidney disease: Secondary | ICD-10-CM | POA: Diagnosis not present

## 2017-01-04 DIAGNOSIS — N186 End stage renal disease: Secondary | ICD-10-CM | POA: Diagnosis not present

## 2017-01-04 DIAGNOSIS — E875 Hyperkalemia: Secondary | ICD-10-CM | POA: Diagnosis not present

## 2017-01-04 DIAGNOSIS — N2581 Secondary hyperparathyroidism of renal origin: Secondary | ICD-10-CM | POA: Diagnosis not present

## 2017-01-06 DIAGNOSIS — E875 Hyperkalemia: Secondary | ICD-10-CM | POA: Diagnosis not present

## 2017-01-06 DIAGNOSIS — N2581 Secondary hyperparathyroidism of renal origin: Secondary | ICD-10-CM | POA: Diagnosis not present

## 2017-01-06 DIAGNOSIS — D631 Anemia in chronic kidney disease: Secondary | ICD-10-CM | POA: Diagnosis not present

## 2017-01-06 DIAGNOSIS — N186 End stage renal disease: Secondary | ICD-10-CM | POA: Diagnosis not present

## 2017-01-06 DIAGNOSIS — E1129 Type 2 diabetes mellitus with other diabetic kidney complication: Secondary | ICD-10-CM | POA: Diagnosis not present

## 2017-01-07 DIAGNOSIS — N186 End stage renal disease: Secondary | ICD-10-CM | POA: Diagnosis not present

## 2017-01-07 DIAGNOSIS — Z992 Dependence on renal dialysis: Secondary | ICD-10-CM | POA: Diagnosis not present

## 2017-01-07 DIAGNOSIS — M1712 Unilateral primary osteoarthritis, left knee: Secondary | ICD-10-CM | POA: Diagnosis not present

## 2017-01-07 DIAGNOSIS — E1129 Type 2 diabetes mellitus with other diabetic kidney complication: Secondary | ICD-10-CM | POA: Diagnosis not present

## 2017-01-08 DIAGNOSIS — Z23 Encounter for immunization: Secondary | ICD-10-CM | POA: Diagnosis not present

## 2017-01-08 DIAGNOSIS — D631 Anemia in chronic kidney disease: Secondary | ICD-10-CM | POA: Diagnosis not present

## 2017-01-08 DIAGNOSIS — E1129 Type 2 diabetes mellitus with other diabetic kidney complication: Secondary | ICD-10-CM | POA: Diagnosis not present

## 2017-01-08 DIAGNOSIS — N186 End stage renal disease: Secondary | ICD-10-CM | POA: Diagnosis not present

## 2017-01-08 DIAGNOSIS — N2581 Secondary hyperparathyroidism of renal origin: Secondary | ICD-10-CM | POA: Diagnosis not present

## 2017-01-11 DIAGNOSIS — E1129 Type 2 diabetes mellitus with other diabetic kidney complication: Secondary | ICD-10-CM | POA: Diagnosis not present

## 2017-01-11 DIAGNOSIS — Z23 Encounter for immunization: Secondary | ICD-10-CM | POA: Diagnosis not present

## 2017-01-11 DIAGNOSIS — N186 End stage renal disease: Secondary | ICD-10-CM | POA: Diagnosis not present

## 2017-01-11 DIAGNOSIS — D631 Anemia in chronic kidney disease: Secondary | ICD-10-CM | POA: Diagnosis not present

## 2017-01-11 DIAGNOSIS — N2581 Secondary hyperparathyroidism of renal origin: Secondary | ICD-10-CM | POA: Diagnosis not present

## 2017-01-13 DIAGNOSIS — D631 Anemia in chronic kidney disease: Secondary | ICD-10-CM | POA: Diagnosis not present

## 2017-01-13 DIAGNOSIS — E1129 Type 2 diabetes mellitus with other diabetic kidney complication: Secondary | ICD-10-CM | POA: Diagnosis not present

## 2017-01-13 DIAGNOSIS — N2581 Secondary hyperparathyroidism of renal origin: Secondary | ICD-10-CM | POA: Diagnosis not present

## 2017-01-13 DIAGNOSIS — Z23 Encounter for immunization: Secondary | ICD-10-CM | POA: Diagnosis not present

## 2017-01-13 DIAGNOSIS — N186 End stage renal disease: Secondary | ICD-10-CM | POA: Diagnosis not present

## 2017-01-14 ENCOUNTER — Encounter: Payer: Self-pay | Admitting: Podiatry

## 2017-01-14 ENCOUNTER — Ambulatory Visit (INDEPENDENT_AMBULATORY_CARE_PROVIDER_SITE_OTHER): Payer: Medicare Other | Admitting: Podiatry

## 2017-01-14 DIAGNOSIS — L97511 Non-pressure chronic ulcer of other part of right foot limited to breakdown of skin: Secondary | ICD-10-CM | POA: Diagnosis not present

## 2017-01-15 DIAGNOSIS — D631 Anemia in chronic kidney disease: Secondary | ICD-10-CM | POA: Diagnosis not present

## 2017-01-15 DIAGNOSIS — Z23 Encounter for immunization: Secondary | ICD-10-CM | POA: Diagnosis not present

## 2017-01-15 DIAGNOSIS — N186 End stage renal disease: Secondary | ICD-10-CM | POA: Diagnosis not present

## 2017-01-15 DIAGNOSIS — E1129 Type 2 diabetes mellitus with other diabetic kidney complication: Secondary | ICD-10-CM | POA: Diagnosis not present

## 2017-01-15 DIAGNOSIS — N2581 Secondary hyperparathyroidism of renal origin: Secondary | ICD-10-CM | POA: Diagnosis not present

## 2017-01-18 DIAGNOSIS — N186 End stage renal disease: Secondary | ICD-10-CM | POA: Diagnosis not present

## 2017-01-18 DIAGNOSIS — D631 Anemia in chronic kidney disease: Secondary | ICD-10-CM | POA: Diagnosis not present

## 2017-01-18 DIAGNOSIS — E1129 Type 2 diabetes mellitus with other diabetic kidney complication: Secondary | ICD-10-CM | POA: Diagnosis not present

## 2017-01-18 DIAGNOSIS — Z23 Encounter for immunization: Secondary | ICD-10-CM | POA: Diagnosis not present

## 2017-01-18 DIAGNOSIS — N2581 Secondary hyperparathyroidism of renal origin: Secondary | ICD-10-CM | POA: Diagnosis not present

## 2017-01-20 DIAGNOSIS — N186 End stage renal disease: Secondary | ICD-10-CM | POA: Diagnosis not present

## 2017-01-20 DIAGNOSIS — Z23 Encounter for immunization: Secondary | ICD-10-CM | POA: Diagnosis not present

## 2017-01-20 DIAGNOSIS — D631 Anemia in chronic kidney disease: Secondary | ICD-10-CM | POA: Diagnosis not present

## 2017-01-20 DIAGNOSIS — E1129 Type 2 diabetes mellitus with other diabetic kidney complication: Secondary | ICD-10-CM | POA: Diagnosis not present

## 2017-01-20 DIAGNOSIS — N2581 Secondary hyperparathyroidism of renal origin: Secondary | ICD-10-CM | POA: Diagnosis not present

## 2017-01-20 NOTE — Progress Notes (Signed)
   Subjective:    Patient ID: Jamie LippsBetty G Phillippi, female    DOB: 02/09/1933, 83 y.o.   MRN: [id number]  HPI Chief Complaint  Patient presents with  . Callouses    Patient requesting callus trim - also check the heel right for calluses-tender    83 y.o. female returns for the above complaints. States that she has painful calluses to the bottom of both feet. Denies DM.   Review of Systems     Objective:   Physical Exam There were no vitals filed for this visit. General AA&O x3. Normal mood and affect.  Vascular Dorsalis pedis and posterior tibial pulses  present 1+ bilaterally  Capillary refill normal to all digits. Pedal hair growth diminished.  Neurologic Epicritic sensation grossly present. Protective sensation absent  Dermatologic (Wound) Wound Location: Lt. foot sub 1st metatarsal head Wound Measurement: 2 cm x 1.0 cm x 0.1 Wound Base: Granular/Healthy Peri-wound: Calloused Exudate: None: wound tissue dry   Orthopedic: MMT 5/5 in dorsiflexion, plantarflexion, inversion, and eversion. Normal lower extremity joint ROM without pain or crepitus.     Assessment & Plan:  Ulceration 1st Metatarsal Head Right -Pre-ulcerative callus debrided with evidence of plantar ulceration upon debridement. See below. -Patient educated on etiology of ulceration and on basic wound care. -Abx ointment and bandage applied. -Offloading pad dispensed. -Discussed with patient's family member how to care for the ulceration.  Procedure: Selective Debridement of Wound Rationale: Removal of devitalized tissue from the wound to promote healing.  Pre-Debridement Wound Measurements: hyperkeratotic lesion Post-Debridement Wound Measurements: 2 cm x 1 cm x 0.1 cm  Type of Debridement: Selective Tissue Removed: Devitalized soft-tissue Instrumentation: 15 blade, tissue nipper Dressing: Abx ointment, dry, sterile, compression dressing. Disposition: Patient tolerated procedure well. Patient to return in 1 week  for follow-up.

## 2017-01-21 ENCOUNTER — Encounter: Payer: Self-pay | Admitting: Podiatry

## 2017-01-21 ENCOUNTER — Ambulatory Visit (INDEPENDENT_AMBULATORY_CARE_PROVIDER_SITE_OTHER): Payer: Medicare Other | Admitting: Podiatry

## 2017-01-21 VITALS — BP 141/79 | HR 85 | Temp 98.2°F

## 2017-01-21 DIAGNOSIS — L97511 Non-pressure chronic ulcer of other part of right foot limited to breakdown of skin: Secondary | ICD-10-CM

## 2017-01-22 DIAGNOSIS — Z23 Encounter for immunization: Secondary | ICD-10-CM | POA: Diagnosis not present

## 2017-01-22 DIAGNOSIS — N2581 Secondary hyperparathyroidism of renal origin: Secondary | ICD-10-CM | POA: Diagnosis not present

## 2017-01-22 DIAGNOSIS — N186 End stage renal disease: Secondary | ICD-10-CM | POA: Diagnosis not present

## 2017-01-22 DIAGNOSIS — D631 Anemia in chronic kidney disease: Secondary | ICD-10-CM | POA: Diagnosis not present

## 2017-01-22 DIAGNOSIS — E1129 Type 2 diabetes mellitus with other diabetic kidney complication: Secondary | ICD-10-CM | POA: Diagnosis not present

## 2017-01-25 DIAGNOSIS — D631 Anemia in chronic kidney disease: Secondary | ICD-10-CM | POA: Diagnosis not present

## 2017-01-25 DIAGNOSIS — N186 End stage renal disease: Secondary | ICD-10-CM | POA: Diagnosis not present

## 2017-01-25 DIAGNOSIS — E1129 Type 2 diabetes mellitus with other diabetic kidney complication: Secondary | ICD-10-CM | POA: Diagnosis not present

## 2017-01-25 DIAGNOSIS — Z23 Encounter for immunization: Secondary | ICD-10-CM | POA: Diagnosis not present

## 2017-01-25 DIAGNOSIS — N2581 Secondary hyperparathyroidism of renal origin: Secondary | ICD-10-CM | POA: Diagnosis not present

## 2017-01-26 ENCOUNTER — Ambulatory Visit (INDEPENDENT_AMBULATORY_CARE_PROVIDER_SITE_OTHER): Payer: Medicare Other | Admitting: Podiatry

## 2017-01-26 ENCOUNTER — Encounter: Payer: Self-pay | Admitting: Podiatry

## 2017-01-26 DIAGNOSIS — L97511 Non-pressure chronic ulcer of other part of right foot limited to breakdown of skin: Secondary | ICD-10-CM | POA: Diagnosis not present

## 2017-01-26 NOTE — Progress Notes (Signed)
   Subjective:    Patient ID: [name], female    DOB: 21-Apr-1933, 83 y.o.   MRN: [id number]   HPI  Chief Complaint  Patient presents with  . Foot Ulcer    i have been doing better on the ball of my left foot    83 y.o. female returns for the above complaint. States the ulcer to her foot has been doing much better. Denies new concerns.  Review of Systems     Objective:   Physical Exam There were no vitals filed for this visit. General AA&O x3. Normal mood and affect.  Vascular Dorsalis pedis and posterior tibial pulses  present 1+ bilaterally  Capillary refill normal to all digits. Pedal hair growth diminished.  Neurologic Epicritic sensation grossly present. Protective sensation absent  Dermatologic (Wound) Wound Location: Rt sib 1st MPJ Wound Measurement: 0.2 x 0.1 superficial Wound Base: thin epithelialization Peri-wound: Normal Exudate: None: wound tissue dry Wound progress: Improved since last check.   Orthopedic: MMT 5/5 in dorsiflexion, plantarflexion, inversion, and eversion. Normal lower extremity joint ROM without pain or crepitus.     Assessment & Plan:  Ulcer R 1st Metatarsal -Ulceration greatly improved, almost fully healed. -Discussed continuing abx ointment and band-aid for an additional week. -Patient can d/c the surgical shoe in 2 weeks. -Discussed if the ulceration worsens to resume abx ointment and band-aids and surgical shoe.  Return in about 3 weeks (around 02/16/2017).

## 2017-01-27 DIAGNOSIS — D631 Anemia in chronic kidney disease: Secondary | ICD-10-CM | POA: Diagnosis not present

## 2017-01-27 DIAGNOSIS — Z23 Encounter for immunization: Secondary | ICD-10-CM | POA: Diagnosis not present

## 2017-01-27 DIAGNOSIS — N2581 Secondary hyperparathyroidism of renal origin: Secondary | ICD-10-CM | POA: Diagnosis not present

## 2017-01-27 DIAGNOSIS — E1129 Type 2 diabetes mellitus with other diabetic kidney complication: Secondary | ICD-10-CM | POA: Diagnosis not present

## 2017-01-27 DIAGNOSIS — N186 End stage renal disease: Secondary | ICD-10-CM | POA: Diagnosis not present

## 2017-01-29 DIAGNOSIS — N186 End stage renal disease: Secondary | ICD-10-CM | POA: Diagnosis not present

## 2017-01-29 DIAGNOSIS — D631 Anemia in chronic kidney disease: Secondary | ICD-10-CM | POA: Diagnosis not present

## 2017-01-29 DIAGNOSIS — Z23 Encounter for immunization: Secondary | ICD-10-CM | POA: Diagnosis not present

## 2017-01-29 DIAGNOSIS — N2581 Secondary hyperparathyroidism of renal origin: Secondary | ICD-10-CM | POA: Diagnosis not present

## 2017-01-29 DIAGNOSIS — E1129 Type 2 diabetes mellitus with other diabetic kidney complication: Secondary | ICD-10-CM | POA: Diagnosis not present

## 2017-01-31 NOTE — Progress Notes (Signed)
   Subjective:    Patient ID: [name], female    DOB: [birth year], 83 y.o.   MRN: [id number]  HPI Chief Complaint  Patient presents with  . Foot Ulcer    Follow up sub 1st MPJ right   "Its looking better"    83 y.o. female returns for the above complaint. Has been applying abx ointment and band-aid as directed. Believes the wound to be improving. No new issues.  Review of Systems     Objective:   Physical Exam Vitals:   01/21/17 1403  BP: (!) 141/79  Pulse: 85  Temp: 98.2 F (36.8 C)   General AA&O x3. Normal mood and affect.  Vascular Dorsalis pedis and posterior tibial pulses  present 1+ bilaterally  Capillary refill normal to all digits. Pedal hair growth diminished.  Neurologic Epicritic sensation grossly present.  Dermatologic Wound Location: Rt sub 1st MPJ Wound Measurement: 1.0 x 0.5 superficial Wound Base: granular/some epithelialization Peri-wound: Normal Exudate: None: wound tissue dry Wound progress: Improved since last check.   Orthopedic: MMT 5/5 in dorsiflexion, plantarflexion, inversion, and eversion. Normal joint ROM without pain or crepitus.     Assessment & Plan:  Ulceration 1st Metatarsal Head Right -Wound continues to improve. -Continue abx ointment and band-aid. -Continue off-loading in surgical shoe. -Follow up in 1 week.

## 2017-02-01 DIAGNOSIS — Z23 Encounter for immunization: Secondary | ICD-10-CM | POA: Diagnosis not present

## 2017-02-01 DIAGNOSIS — E1129 Type 2 diabetes mellitus with other diabetic kidney complication: Secondary | ICD-10-CM | POA: Diagnosis not present

## 2017-02-01 DIAGNOSIS — D631 Anemia in chronic kidney disease: Secondary | ICD-10-CM | POA: Diagnosis not present

## 2017-02-01 DIAGNOSIS — N186 End stage renal disease: Secondary | ICD-10-CM | POA: Diagnosis not present

## 2017-02-01 DIAGNOSIS — N2581 Secondary hyperparathyroidism of renal origin: Secondary | ICD-10-CM | POA: Diagnosis not present

## 2017-02-03 DIAGNOSIS — N2581 Secondary hyperparathyroidism of renal origin: Secondary | ICD-10-CM | POA: Diagnosis not present

## 2017-02-03 DIAGNOSIS — N186 End stage renal disease: Secondary | ICD-10-CM | POA: Diagnosis not present

## 2017-02-03 DIAGNOSIS — Z23 Encounter for immunization: Secondary | ICD-10-CM | POA: Diagnosis not present

## 2017-02-03 DIAGNOSIS — D631 Anemia in chronic kidney disease: Secondary | ICD-10-CM | POA: Diagnosis not present

## 2017-02-03 DIAGNOSIS — E1129 Type 2 diabetes mellitus with other diabetic kidney complication: Secondary | ICD-10-CM | POA: Diagnosis not present

## 2017-02-05 DIAGNOSIS — D631 Anemia in chronic kidney disease: Secondary | ICD-10-CM | POA: Diagnosis not present

## 2017-02-05 DIAGNOSIS — N2581 Secondary hyperparathyroidism of renal origin: Secondary | ICD-10-CM | POA: Diagnosis not present

## 2017-02-05 DIAGNOSIS — Z23 Encounter for immunization: Secondary | ICD-10-CM | POA: Diagnosis not present

## 2017-02-05 DIAGNOSIS — E1129 Type 2 diabetes mellitus with other diabetic kidney complication: Secondary | ICD-10-CM | POA: Diagnosis not present

## 2017-02-05 DIAGNOSIS — N186 End stage renal disease: Secondary | ICD-10-CM | POA: Diagnosis not present

## 2017-02-06 DIAGNOSIS — N186 End stage renal disease: Secondary | ICD-10-CM | POA: Diagnosis not present

## 2017-02-06 DIAGNOSIS — Z992 Dependence on renal dialysis: Secondary | ICD-10-CM | POA: Diagnosis not present

## 2017-02-06 DIAGNOSIS — E1129 Type 2 diabetes mellitus with other diabetic kidney complication: Secondary | ICD-10-CM | POA: Diagnosis not present

## 2017-02-08 ENCOUNTER — Other Ambulatory Visit: Payer: Self-pay | Admitting: Family Medicine

## 2017-02-08 DIAGNOSIS — Z1239 Encounter for other screening for malignant neoplasm of breast: Secondary | ICD-10-CM

## 2017-02-08 DIAGNOSIS — E1129 Type 2 diabetes mellitus with other diabetic kidney complication: Secondary | ICD-10-CM | POA: Diagnosis not present

## 2017-02-08 DIAGNOSIS — N2581 Secondary hyperparathyroidism of renal origin: Secondary | ICD-10-CM | POA: Diagnosis not present

## 2017-02-08 DIAGNOSIS — N186 End stage renal disease: Secondary | ICD-10-CM | POA: Diagnosis not present

## 2017-02-10 DIAGNOSIS — N2581 Secondary hyperparathyroidism of renal origin: Secondary | ICD-10-CM | POA: Diagnosis not present

## 2017-02-10 DIAGNOSIS — N186 End stage renal disease: Secondary | ICD-10-CM | POA: Diagnosis not present

## 2017-02-10 DIAGNOSIS — E1129 Type 2 diabetes mellitus with other diabetic kidney complication: Secondary | ICD-10-CM | POA: Diagnosis not present

## 2017-02-12 DIAGNOSIS — E1129 Type 2 diabetes mellitus with other diabetic kidney complication: Secondary | ICD-10-CM | POA: Diagnosis not present

## 2017-02-12 DIAGNOSIS — N186 End stage renal disease: Secondary | ICD-10-CM | POA: Diagnosis not present

## 2017-02-12 DIAGNOSIS — N2581 Secondary hyperparathyroidism of renal origin: Secondary | ICD-10-CM | POA: Diagnosis not present

## 2017-02-14 ENCOUNTER — Encounter: Payer: Self-pay | Admitting: Podiatry

## 2017-02-14 ENCOUNTER — Ambulatory Visit (INDEPENDENT_AMBULATORY_CARE_PROVIDER_SITE_OTHER): Payer: Medicare Other | Admitting: Podiatry

## 2017-02-14 VITALS — BP 129/63 | HR 75

## 2017-02-14 DIAGNOSIS — I739 Peripheral vascular disease, unspecified: Secondary | ICD-10-CM | POA: Diagnosis not present

## 2017-02-14 DIAGNOSIS — M79676 Pain in unspecified toe(s): Secondary | ICD-10-CM

## 2017-02-14 DIAGNOSIS — E1151 Type 2 diabetes mellitus with diabetic peripheral angiopathy without gangrene: Secondary | ICD-10-CM

## 2017-02-14 DIAGNOSIS — L84 Corns and callosities: Secondary | ICD-10-CM | POA: Diagnosis not present

## 2017-02-14 DIAGNOSIS — B351 Tinea unguium: Secondary | ICD-10-CM | POA: Diagnosis not present

## 2017-02-15 DIAGNOSIS — N2581 Secondary hyperparathyroidism of renal origin: Secondary | ICD-10-CM | POA: Diagnosis not present

## 2017-02-15 DIAGNOSIS — E1129 Type 2 diabetes mellitus with other diabetic kidney complication: Secondary | ICD-10-CM | POA: Diagnosis not present

## 2017-02-15 DIAGNOSIS — N186 End stage renal disease: Secondary | ICD-10-CM | POA: Diagnosis not present

## 2017-02-16 ENCOUNTER — Other Ambulatory Visit: Payer: Medicare Other

## 2017-02-17 DIAGNOSIS — E1129 Type 2 diabetes mellitus with other diabetic kidney complication: Secondary | ICD-10-CM | POA: Diagnosis not present

## 2017-02-17 DIAGNOSIS — N186 End stage renal disease: Secondary | ICD-10-CM | POA: Diagnosis not present

## 2017-02-17 DIAGNOSIS — N2581 Secondary hyperparathyroidism of renal origin: Secondary | ICD-10-CM | POA: Diagnosis not present

## 2017-02-17 NOTE — Progress Notes (Signed)
Patient ID: [name], female   DOB: 21-Jan-1933, 83 y.o.   MRN: [id number]  Subjective: 83 y.o. returns the office today for painful, elongated, thickened toenails and calluses which she cannot trim herself.since her last appointment with me she did see Dr. Samuella Cota for a wound that developed her right foot submetatarsal area. 83 y.o. returns the office today for painful, elongated, thickened toenails and calluses which she cannot trim herself.since her last appointment with me she did see Dr. Samuella Cota for a wound that developed her right foot submetatarsal area. She feels that the serious heel and she has not as increase in swelling or redness or any drainage coming from the area. She has not noticed any new wounds formed. She denies any redness or drainage around the nails/calluses. Denies any acute changes since last appointment and no new complaints today. Denies any systemic complaints such as fevers, chills, nausea, vomiting.   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  Nails hypertrophic, dystrophic, elongated, brittle, discolored 9. There is tenderness overlying the nails 2-5 on the left and 1-5 on the right. There is no surrounding erythema or drainage along the nail sites. Hyperkeratotic lesions bilateral foot submetatarsal 5 and lateral 5th digit on the lright/left foot. Right foot submetatarsal one there is no open sore identified today and the wound appears to be healed. There is no evidence of any underlying wound present bilateral feet. There is no increase in swelling bilaterally there is no erythema or increase in warmth. No clinical signs of infection are present today. No pain with calf compression, swelling, warmth, erythema.  Assessment: Patient presents with symptomatic onychomycosis; pre-ulcerative calluses; healed wounds right foot  Plan: -Treatment options including alternatives, risks, complications were discussed -Nails sharply debrided 9 without complication/bleeding. -Hyperkeratotic lesion sharply debrided  3 without complications/bleeding -No evidence of ulceration or infection  today. Continue to monitor closely for any skin breakdown. -Follow-up as scheduled or sooner if any problems are to arise. In the meantime, encouraged to call the office with any questions, concerns, changes symptoms.  Ovid Curd, DPM and she has not as increase in swelling or redness or any drainage coming from the area. She has not noticed any new wounds formed. She denies any redness or drainage around the nails/calluses. Denies any acute changes since last appointment and no new complaints today. Denies any systemic complaints such as fevers, chills, nausea, vomiting.   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  Nails hypertrophic, dystrophic, elongated, brittle, discolored 9. There is tenderness overlying the nails 2-5 on the left and 1-5 on the right. There is no surrounding erythema or drainage along the nail sites. Hyperkeratotic lesions bilateral foot submetatarsal 5 and lateral 5th digit on the lright/left foot. Right foot submetatarsal one there is no open sore identified today and the wound appears to be healed. There is no evidence of any underlying wound present bilateral feet. There is no increase in swelling bilaterally there is no erythema or increase in warmth. No clinical signs of infection are present today. No pain with calf compression, swelling, warmth, erythema.  Assessment: Patient presents with symptomatic onychomycosis; pre-ulcerative calluses; healed wounds right foot  Plan: -Treatment options including alternatives, risks, complications were discussed -Nails sharply debrided 9 without complication/bleeding. -Hyperkeratotic lesion sharply debrided  3 without complications/bleeding -No evidence of ulceration or infection  today. Continue to monitor closely for any skin breakdown. -Follow-up as scheduled or sooner if any problems are to arise. In the meantime, encouraged to call the office with any questions, concerns, changes symptoms.  Ovid Curd, DPM

## 2017-02-19 DIAGNOSIS — N186 End stage renal disease: Secondary | ICD-10-CM | POA: Diagnosis not present

## 2017-02-19 DIAGNOSIS — N2581 Secondary hyperparathyroidism of renal origin: Secondary | ICD-10-CM | POA: Diagnosis not present

## 2017-02-19 DIAGNOSIS — E1129 Type 2 diabetes mellitus with other diabetic kidney complication: Secondary | ICD-10-CM | POA: Diagnosis not present

## 2017-02-22 DIAGNOSIS — N186 End stage renal disease: Secondary | ICD-10-CM | POA: Diagnosis not present

## 2017-02-22 DIAGNOSIS — N2581 Secondary hyperparathyroidism of renal origin: Secondary | ICD-10-CM | POA: Diagnosis not present

## 2017-02-22 DIAGNOSIS — E1129 Type 2 diabetes mellitus with other diabetic kidney complication: Secondary | ICD-10-CM | POA: Diagnosis not present

## 2017-02-24 DIAGNOSIS — E1129 Type 2 diabetes mellitus with other diabetic kidney complication: Secondary | ICD-10-CM | POA: Diagnosis not present

## 2017-02-24 DIAGNOSIS — N2581 Secondary hyperparathyroidism of renal origin: Secondary | ICD-10-CM | POA: Diagnosis not present

## 2017-02-24 DIAGNOSIS — N186 End stage renal disease: Secondary | ICD-10-CM | POA: Diagnosis not present

## 2017-02-26 DIAGNOSIS — E1129 Type 2 diabetes mellitus with other diabetic kidney complication: Secondary | ICD-10-CM | POA: Diagnosis not present

## 2017-02-26 DIAGNOSIS — N186 End stage renal disease: Secondary | ICD-10-CM | POA: Diagnosis not present

## 2017-02-26 DIAGNOSIS — N2581 Secondary hyperparathyroidism of renal origin: Secondary | ICD-10-CM | POA: Diagnosis not present

## 2017-03-01 DIAGNOSIS — E1129 Type 2 diabetes mellitus with other diabetic kidney complication: Secondary | ICD-10-CM | POA: Diagnosis not present

## 2017-03-01 DIAGNOSIS — N186 End stage renal disease: Secondary | ICD-10-CM | POA: Diagnosis not present

## 2017-03-01 DIAGNOSIS — N2581 Secondary hyperparathyroidism of renal origin: Secondary | ICD-10-CM | POA: Diagnosis not present

## 2017-03-03 DIAGNOSIS — N186 End stage renal disease: Secondary | ICD-10-CM | POA: Diagnosis not present

## 2017-03-03 DIAGNOSIS — N2581 Secondary hyperparathyroidism of renal origin: Secondary | ICD-10-CM | POA: Diagnosis not present

## 2017-03-03 DIAGNOSIS — E1129 Type 2 diabetes mellitus with other diabetic kidney complication: Secondary | ICD-10-CM | POA: Diagnosis not present

## 2017-03-05 DIAGNOSIS — E1129 Type 2 diabetes mellitus with other diabetic kidney complication: Secondary | ICD-10-CM | POA: Diagnosis not present

## 2017-03-05 DIAGNOSIS — N186 End stage renal disease: Secondary | ICD-10-CM | POA: Diagnosis not present

## 2017-03-05 DIAGNOSIS — N2581 Secondary hyperparathyroidism of renal origin: Secondary | ICD-10-CM | POA: Diagnosis not present

## 2017-03-07 ENCOUNTER — Ambulatory Visit
Admission: RE | Admit: 2017-03-07 | Discharge: 2017-03-07 | Disposition: A | Discharge status: Home / Self Care | Payer: Medicare Other | Source: Ambulatory Visit | Attending: Family Medicine | Admitting: Family Medicine

## 2017-03-07 DIAGNOSIS — Z1231 Encounter for screening mammogram for malignant neoplasm of breast: Secondary | ICD-10-CM | POA: Diagnosis not present

## 2017-03-07 DIAGNOSIS — Z1239 Encounter for other screening for malignant neoplasm of breast: Secondary | ICD-10-CM

## 2017-03-08 DIAGNOSIS — E1129 Type 2 diabetes mellitus with other diabetic kidney complication: Secondary | ICD-10-CM | POA: Diagnosis not present

## 2017-03-08 DIAGNOSIS — N186 End stage renal disease: Secondary | ICD-10-CM | POA: Diagnosis not present

## 2017-03-08 DIAGNOSIS — N2581 Secondary hyperparathyroidism of renal origin: Secondary | ICD-10-CM | POA: Diagnosis not present

## 2017-03-09 DIAGNOSIS — E1129 Type 2 diabetes mellitus with other diabetic kidney complication: Secondary | ICD-10-CM | POA: Diagnosis not present

## 2017-03-09 DIAGNOSIS — N186 End stage renal disease: Secondary | ICD-10-CM | POA: Diagnosis not present

## 2017-03-09 DIAGNOSIS — Z992 Dependence on renal dialysis: Secondary | ICD-10-CM | POA: Diagnosis not present

## 2017-03-10 DIAGNOSIS — E1129 Type 2 diabetes mellitus with other diabetic kidney complication: Secondary | ICD-10-CM | POA: Diagnosis not present

## 2017-03-10 DIAGNOSIS — D631 Anemia in chronic kidney disease: Secondary | ICD-10-CM | POA: Diagnosis not present

## 2017-03-10 DIAGNOSIS — N2581 Secondary hyperparathyroidism of renal origin: Secondary | ICD-10-CM | POA: Diagnosis not present

## 2017-03-10 DIAGNOSIS — N186 End stage renal disease: Secondary | ICD-10-CM | POA: Diagnosis not present

## 2017-03-12 DIAGNOSIS — D631 Anemia in chronic kidney disease: Secondary | ICD-10-CM | POA: Diagnosis not present

## 2017-03-12 DIAGNOSIS — E1129 Type 2 diabetes mellitus with other diabetic kidney complication: Secondary | ICD-10-CM | POA: Diagnosis not present

## 2017-03-12 DIAGNOSIS — N2581 Secondary hyperparathyroidism of renal origin: Secondary | ICD-10-CM | POA: Diagnosis not present

## 2017-03-12 DIAGNOSIS — N186 End stage renal disease: Secondary | ICD-10-CM | POA: Diagnosis not present

## 2017-03-15 DIAGNOSIS — E1129 Type 2 diabetes mellitus with other diabetic kidney complication: Secondary | ICD-10-CM | POA: Diagnosis not present

## 2017-03-15 DIAGNOSIS — N2581 Secondary hyperparathyroidism of renal origin: Secondary | ICD-10-CM | POA: Diagnosis not present

## 2017-03-15 DIAGNOSIS — D631 Anemia in chronic kidney disease: Secondary | ICD-10-CM | POA: Diagnosis not present

## 2017-03-15 DIAGNOSIS — N186 End stage renal disease: Secondary | ICD-10-CM | POA: Diagnosis not present

## 2017-03-16 ENCOUNTER — Telehealth: Payer: Self-pay | Admitting: Podiatry

## 2017-03-16 NOTE — Telephone Encounter (Signed)
Diabetic shoes and inserts are in and I left a voicemail for family member of pt to call to schedule an appt to pick up diabetic shoes..Marland Kitchen

## 2017-03-17 DIAGNOSIS — N2581 Secondary hyperparathyroidism of renal origin: Secondary | ICD-10-CM | POA: Diagnosis not present

## 2017-03-17 DIAGNOSIS — D631 Anemia in chronic kidney disease: Secondary | ICD-10-CM | POA: Diagnosis not present

## 2017-03-17 DIAGNOSIS — N186 End stage renal disease: Secondary | ICD-10-CM | POA: Diagnosis not present

## 2017-03-17 DIAGNOSIS — E1129 Type 2 diabetes mellitus with other diabetic kidney complication: Secondary | ICD-10-CM | POA: Diagnosis not present

## 2017-03-19 DIAGNOSIS — N186 End stage renal disease: Secondary | ICD-10-CM | POA: Diagnosis not present

## 2017-03-19 DIAGNOSIS — N2581 Secondary hyperparathyroidism of renal origin: Secondary | ICD-10-CM | POA: Diagnosis not present

## 2017-03-19 DIAGNOSIS — E1129 Type 2 diabetes mellitus with other diabetic kidney complication: Secondary | ICD-10-CM | POA: Diagnosis not present

## 2017-03-19 DIAGNOSIS — D631 Anemia in chronic kidney disease: Secondary | ICD-10-CM | POA: Diagnosis not present

## 2017-03-22 DIAGNOSIS — N186 End stage renal disease: Secondary | ICD-10-CM | POA: Diagnosis not present

## 2017-03-22 DIAGNOSIS — E1129 Type 2 diabetes mellitus with other diabetic kidney complication: Secondary | ICD-10-CM | POA: Diagnosis not present

## 2017-03-22 DIAGNOSIS — D631 Anemia in chronic kidney disease: Secondary | ICD-10-CM | POA: Diagnosis not present

## 2017-03-22 DIAGNOSIS — N2581 Secondary hyperparathyroidism of renal origin: Secondary | ICD-10-CM | POA: Diagnosis not present

## 2017-03-24 DIAGNOSIS — D631 Anemia in chronic kidney disease: Secondary | ICD-10-CM | POA: Diagnosis not present

## 2017-03-24 DIAGNOSIS — N2581 Secondary hyperparathyroidism of renal origin: Secondary | ICD-10-CM | POA: Diagnosis not present

## 2017-03-24 DIAGNOSIS — N186 End stage renal disease: Secondary | ICD-10-CM | POA: Diagnosis not present

## 2017-03-24 DIAGNOSIS — E1129 Type 2 diabetes mellitus with other diabetic kidney complication: Secondary | ICD-10-CM | POA: Diagnosis not present

## 2017-03-26 DIAGNOSIS — N186 End stage renal disease: Secondary | ICD-10-CM | POA: Diagnosis not present

## 2017-03-26 DIAGNOSIS — E1129 Type 2 diabetes mellitus with other diabetic kidney complication: Secondary | ICD-10-CM | POA: Diagnosis not present

## 2017-03-26 DIAGNOSIS — D631 Anemia in chronic kidney disease: Secondary | ICD-10-CM | POA: Diagnosis not present

## 2017-03-26 DIAGNOSIS — N2581 Secondary hyperparathyroidism of renal origin: Secondary | ICD-10-CM | POA: Diagnosis not present

## 2017-03-28 DIAGNOSIS — N186 End stage renal disease: Secondary | ICD-10-CM | POA: Diagnosis not present

## 2017-03-28 DIAGNOSIS — N2581 Secondary hyperparathyroidism of renal origin: Secondary | ICD-10-CM | POA: Diagnosis not present

## 2017-03-28 DIAGNOSIS — E1129 Type 2 diabetes mellitus with other diabetic kidney complication: Secondary | ICD-10-CM | POA: Diagnosis not present

## 2017-03-28 DIAGNOSIS — D631 Anemia in chronic kidney disease: Secondary | ICD-10-CM | POA: Diagnosis not present

## 2017-03-30 DIAGNOSIS — N186 End stage renal disease: Secondary | ICD-10-CM | POA: Diagnosis not present

## 2017-03-30 DIAGNOSIS — N2581 Secondary hyperparathyroidism of renal origin: Secondary | ICD-10-CM | POA: Diagnosis not present

## 2017-03-30 DIAGNOSIS — E1129 Type 2 diabetes mellitus with other diabetic kidney complication: Secondary | ICD-10-CM | POA: Diagnosis not present

## 2017-03-30 DIAGNOSIS — D631 Anemia in chronic kidney disease: Secondary | ICD-10-CM | POA: Diagnosis not present

## 2017-04-02 DIAGNOSIS — E1129 Type 2 diabetes mellitus with other diabetic kidney complication: Secondary | ICD-10-CM | POA: Diagnosis not present

## 2017-04-02 DIAGNOSIS — N186 End stage renal disease: Secondary | ICD-10-CM | POA: Diagnosis not present

## 2017-04-02 DIAGNOSIS — N2581 Secondary hyperparathyroidism of renal origin: Secondary | ICD-10-CM | POA: Diagnosis not present

## 2017-04-02 DIAGNOSIS — D631 Anemia in chronic kidney disease: Secondary | ICD-10-CM | POA: Diagnosis not present

## 2017-04-05 DIAGNOSIS — N186 End stage renal disease: Secondary | ICD-10-CM | POA: Diagnosis not present

## 2017-04-05 DIAGNOSIS — E1129 Type 2 diabetes mellitus with other diabetic kidney complication: Secondary | ICD-10-CM | POA: Diagnosis not present

## 2017-04-05 DIAGNOSIS — N2581 Secondary hyperparathyroidism of renal origin: Secondary | ICD-10-CM | POA: Diagnosis not present

## 2017-04-05 DIAGNOSIS — D631 Anemia in chronic kidney disease: Secondary | ICD-10-CM | POA: Diagnosis not present

## 2017-04-07 DIAGNOSIS — N2581 Secondary hyperparathyroidism of renal origin: Secondary | ICD-10-CM | POA: Diagnosis not present

## 2017-04-07 DIAGNOSIS — N186 End stage renal disease: Secondary | ICD-10-CM | POA: Diagnosis not present

## 2017-04-07 DIAGNOSIS — D631 Anemia in chronic kidney disease: Secondary | ICD-10-CM | POA: Diagnosis not present

## 2017-04-07 DIAGNOSIS — E1129 Type 2 diabetes mellitus with other diabetic kidney complication: Secondary | ICD-10-CM | POA: Diagnosis not present

## 2017-04-08 DIAGNOSIS — N186 End stage renal disease: Secondary | ICD-10-CM | POA: Diagnosis not present

## 2017-04-08 DIAGNOSIS — Z992 Dependence on renal dialysis: Secondary | ICD-10-CM | POA: Diagnosis not present

## 2017-04-08 DIAGNOSIS — E1129 Type 2 diabetes mellitus with other diabetic kidney complication: Secondary | ICD-10-CM | POA: Diagnosis not present

## 2017-04-09 DIAGNOSIS — D631 Anemia in chronic kidney disease: Secondary | ICD-10-CM | POA: Diagnosis not present

## 2017-04-09 DIAGNOSIS — N2581 Secondary hyperparathyroidism of renal origin: Secondary | ICD-10-CM | POA: Diagnosis not present

## 2017-04-09 DIAGNOSIS — N186 End stage renal disease: Secondary | ICD-10-CM | POA: Diagnosis not present

## 2017-04-09 DIAGNOSIS — E1129 Type 2 diabetes mellitus with other diabetic kidney complication: Secondary | ICD-10-CM | POA: Diagnosis not present

## 2017-04-12 DIAGNOSIS — E1129 Type 2 diabetes mellitus with other diabetic kidney complication: Secondary | ICD-10-CM | POA: Diagnosis not present

## 2017-04-12 DIAGNOSIS — D631 Anemia in chronic kidney disease: Secondary | ICD-10-CM | POA: Diagnosis not present

## 2017-04-12 DIAGNOSIS — N186 End stage renal disease: Secondary | ICD-10-CM | POA: Diagnosis not present

## 2017-04-12 DIAGNOSIS — N2581 Secondary hyperparathyroidism of renal origin: Secondary | ICD-10-CM | POA: Diagnosis not present

## 2017-04-14 DIAGNOSIS — N186 End stage renal disease: Secondary | ICD-10-CM | POA: Diagnosis not present

## 2017-04-14 DIAGNOSIS — E1129 Type 2 diabetes mellitus with other diabetic kidney complication: Secondary | ICD-10-CM | POA: Diagnosis not present

## 2017-04-14 DIAGNOSIS — N2581 Secondary hyperparathyroidism of renal origin: Secondary | ICD-10-CM | POA: Diagnosis not present

## 2017-04-14 DIAGNOSIS — D631 Anemia in chronic kidney disease: Secondary | ICD-10-CM | POA: Diagnosis not present

## 2017-04-16 DIAGNOSIS — N2581 Secondary hyperparathyroidism of renal origin: Secondary | ICD-10-CM | POA: Diagnosis not present

## 2017-04-16 DIAGNOSIS — D631 Anemia in chronic kidney disease: Secondary | ICD-10-CM | POA: Diagnosis not present

## 2017-04-16 DIAGNOSIS — E1129 Type 2 diabetes mellitus with other diabetic kidney complication: Secondary | ICD-10-CM | POA: Diagnosis not present

## 2017-04-16 DIAGNOSIS — N186 End stage renal disease: Secondary | ICD-10-CM | POA: Diagnosis not present

## 2017-04-18 ENCOUNTER — Ambulatory Visit: Payer: Medicare Other | Admitting: Podiatry

## 2017-04-18 ENCOUNTER — Ambulatory Visit: Payer: Medicare Other | Admitting: Orthotics

## 2017-04-20 DIAGNOSIS — N186 End stage renal disease: Secondary | ICD-10-CM | POA: Diagnosis not present

## 2017-04-20 DIAGNOSIS — E1129 Type 2 diabetes mellitus with other diabetic kidney complication: Secondary | ICD-10-CM | POA: Diagnosis not present

## 2017-04-20 DIAGNOSIS — D631 Anemia in chronic kidney disease: Secondary | ICD-10-CM | POA: Diagnosis not present

## 2017-04-20 DIAGNOSIS — N2581 Secondary hyperparathyroidism of renal origin: Secondary | ICD-10-CM | POA: Diagnosis not present

## 2017-04-21 ENCOUNTER — Ambulatory Visit (INDEPENDENT_AMBULATORY_CARE_PROVIDER_SITE_OTHER): Payer: Medicare Other | Admitting: Podiatry

## 2017-04-21 DIAGNOSIS — E1151 Type 2 diabetes mellitus with diabetic peripheral angiopathy without gangrene: Secondary | ICD-10-CM

## 2017-04-21 DIAGNOSIS — M79676 Pain in unspecified toe(s): Secondary | ICD-10-CM

## 2017-04-21 DIAGNOSIS — D631 Anemia in chronic kidney disease: Secondary | ICD-10-CM | POA: Diagnosis not present

## 2017-04-21 DIAGNOSIS — M2041 Other hammer toe(s) (acquired), right foot: Secondary | ICD-10-CM | POA: Diagnosis not present

## 2017-04-21 DIAGNOSIS — B351 Tinea unguium: Secondary | ICD-10-CM

## 2017-04-21 DIAGNOSIS — M2042 Other hammer toe(s) (acquired), left foot: Secondary | ICD-10-CM | POA: Diagnosis not present

## 2017-04-21 DIAGNOSIS — L84 Corns and callosities: Secondary | ICD-10-CM

## 2017-04-21 DIAGNOSIS — I739 Peripheral vascular disease, unspecified: Secondary | ICD-10-CM | POA: Diagnosis not present

## 2017-04-21 DIAGNOSIS — E1129 Type 2 diabetes mellitus with other diabetic kidney complication: Secondary | ICD-10-CM | POA: Diagnosis not present

## 2017-04-21 DIAGNOSIS — L97511 Non-pressure chronic ulcer of other part of right foot limited to breakdown of skin: Secondary | ICD-10-CM

## 2017-04-21 DIAGNOSIS — N186 End stage renal disease: Secondary | ICD-10-CM | POA: Diagnosis not present

## 2017-04-21 DIAGNOSIS — N2581 Secondary hyperparathyroidism of renal origin: Secondary | ICD-10-CM | POA: Diagnosis not present

## 2017-04-21 MED ORDER — SILVER SULFADIAZINE 1 % EX CREA: 1.0000 "application " | TOPICAL_CREAM | Freq: Every day | CUTANEOUS | 0 refills | Status: DC

## 2017-04-23 DIAGNOSIS — N2581 Secondary hyperparathyroidism of renal origin: Secondary | ICD-10-CM | POA: Diagnosis not present

## 2017-04-23 DIAGNOSIS — D631 Anemia in chronic kidney disease: Secondary | ICD-10-CM | POA: Diagnosis not present

## 2017-04-23 DIAGNOSIS — E1129 Type 2 diabetes mellitus with other diabetic kidney complication: Secondary | ICD-10-CM | POA: Diagnosis not present

## 2017-04-23 DIAGNOSIS — N186 End stage renal disease: Secondary | ICD-10-CM | POA: Diagnosis not present

## 2017-04-26 DIAGNOSIS — E1129 Type 2 diabetes mellitus with other diabetic kidney complication: Secondary | ICD-10-CM | POA: Diagnosis not present

## 2017-04-26 DIAGNOSIS — D631 Anemia in chronic kidney disease: Secondary | ICD-10-CM | POA: Diagnosis not present

## 2017-04-26 DIAGNOSIS — N2581 Secondary hyperparathyroidism of renal origin: Secondary | ICD-10-CM | POA: Diagnosis not present

## 2017-04-26 DIAGNOSIS — N186 End stage renal disease: Secondary | ICD-10-CM | POA: Diagnosis not present

## 2017-04-26 NOTE — Progress Notes (Signed)
Patient ID: [name], female   DOB: 05/30/1932, 83 y.o.   MRN: [id number]  Subjective: 83 y.o. returns the office today for painful, elongated, thickened toenails and calluses which she cannot trim herself.  She states that she has developed a wound to the right big toe when she gets some occasional bloody drainage but denies any pus.  Denies any increase in swelling or redness to her feet.  She also presents today to pick up diabetic shoes.  She denies any increase in swelling to her feet she has no other concerns today.  She presents by herself today. Denies any acute changes since last appointment and no new complaints today. Denies any systemic complaints such as fevers, chills, nausea, vomiting.   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  Nails hypertrophic, dystrophic, elongated, brittle, discolored 9. There is tenderness overlying the nails 2-5 on the left and 1-5 on the the right. There is no surrounding erythema or drainage along the nail sites. To the distal aspect of the right hallux there is a superficial granular wound measuring 0.5 x 0.4 cm.  There is no probing, undermining or tunneling.  There is no surrounding erythema, ascending cellulitis.  There is no fluctuation or crepitation.  There is no malodor.  Minimal hyperkeratotic tissue is present Submetatarsal 5 as well as the lateral right fifth toe on the right side.  There is no underlying ulceration, drainage or any signs of infection noted. No other open lesions or pre-ulcerative lesions identified today. No pain with calf compression, swelling, warmth, erythema.  Assessment: Patient presents with symptomatic onychomycosis; pre-ulcerative calluses; ulceration right foot  Plan: -Treatment options including alternatives, risks, complications were discussed -Nails sharply debrided 9 without  complication/bleeding. -Hyperkeratotic lesion sharply debrided  2 without complications/bleeding -Ulceration of the distal aspect of the right hallux today there is no clinical signs of infection.  Recommend interrupted ointment and a dressing daily.  I want her to go into a surgical shoe for offloading she states that she has this at her house therefore did not dispense a new one. -Diabetic shoes were dispensed today.  They appear to be fitting well she is happy with the fit.  Break instructions were discussed. -Monitor for any clinical signs or symptoms of infection and directed to call the office immediately should any occur or go to the ER. -Follow-up as scheduled or sooner if needed.  She agrees this plan has no further questions  Ovid CurdMatthew Wagoner, DPM

## 2017-04-28 DIAGNOSIS — E1129 Type 2 diabetes mellitus with other diabetic kidney complication: Secondary | ICD-10-CM | POA: Diagnosis not present

## 2017-04-28 DIAGNOSIS — N2581 Secondary hyperparathyroidism of renal origin: Secondary | ICD-10-CM | POA: Diagnosis not present

## 2017-04-28 DIAGNOSIS — D631 Anemia in chronic kidney disease: Secondary | ICD-10-CM | POA: Diagnosis not present

## 2017-04-28 DIAGNOSIS — N186 End stage renal disease: Secondary | ICD-10-CM | POA: Diagnosis not present

## 2017-04-29 ENCOUNTER — Ambulatory Visit: Payer: Medicare Other | Admitting: Podiatry

## 2017-04-30 DIAGNOSIS — D631 Anemia in chronic kidney disease: Secondary | ICD-10-CM | POA: Diagnosis not present

## 2017-04-30 DIAGNOSIS — N186 End stage renal disease: Secondary | ICD-10-CM | POA: Diagnosis not present

## 2017-04-30 DIAGNOSIS — N2581 Secondary hyperparathyroidism of renal origin: Secondary | ICD-10-CM | POA: Diagnosis not present

## 2017-04-30 DIAGNOSIS — E1129 Type 2 diabetes mellitus with other diabetic kidney complication: Secondary | ICD-10-CM | POA: Diagnosis not present

## 2017-05-02 DIAGNOSIS — N186 End stage renal disease: Secondary | ICD-10-CM | POA: Diagnosis not present

## 2017-05-02 DIAGNOSIS — D631 Anemia in chronic kidney disease: Secondary | ICD-10-CM | POA: Diagnosis not present

## 2017-05-02 DIAGNOSIS — E1129 Type 2 diabetes mellitus with other diabetic kidney complication: Secondary | ICD-10-CM | POA: Diagnosis not present

## 2017-05-02 DIAGNOSIS — N2581 Secondary hyperparathyroidism of renal origin: Secondary | ICD-10-CM | POA: Diagnosis not present

## 2017-05-05 DIAGNOSIS — N2581 Secondary hyperparathyroidism of renal origin: Secondary | ICD-10-CM | POA: Diagnosis not present

## 2017-05-05 DIAGNOSIS — N186 End stage renal disease: Secondary | ICD-10-CM | POA: Diagnosis not present

## 2017-05-05 DIAGNOSIS — D631 Anemia in chronic kidney disease: Secondary | ICD-10-CM | POA: Diagnosis not present

## 2017-05-05 DIAGNOSIS — E1129 Type 2 diabetes mellitus with other diabetic kidney complication: Secondary | ICD-10-CM | POA: Diagnosis not present

## 2017-05-07 DIAGNOSIS — N2581 Secondary hyperparathyroidism of renal origin: Secondary | ICD-10-CM | POA: Diagnosis not present

## 2017-05-07 DIAGNOSIS — E1129 Type 2 diabetes mellitus with other diabetic kidney complication: Secondary | ICD-10-CM | POA: Diagnosis not present

## 2017-05-07 DIAGNOSIS — N186 End stage renal disease: Secondary | ICD-10-CM | POA: Diagnosis not present

## 2017-05-07 DIAGNOSIS — D631 Anemia in chronic kidney disease: Secondary | ICD-10-CM | POA: Diagnosis not present

## 2017-05-09 DIAGNOSIS — N186 End stage renal disease: Secondary | ICD-10-CM | POA: Diagnosis not present

## 2017-05-09 DIAGNOSIS — E1129 Type 2 diabetes mellitus with other diabetic kidney complication: Secondary | ICD-10-CM | POA: Diagnosis not present

## 2017-05-09 DIAGNOSIS — N2581 Secondary hyperparathyroidism of renal origin: Secondary | ICD-10-CM | POA: Diagnosis not present

## 2017-05-09 DIAGNOSIS — Z992 Dependence on renal dialysis: Secondary | ICD-10-CM | POA: Diagnosis not present

## 2017-05-09 DIAGNOSIS — D631 Anemia in chronic kidney disease: Secondary | ICD-10-CM | POA: Diagnosis not present

## 2017-05-12 DIAGNOSIS — Z23 Encounter for immunization: Secondary | ICD-10-CM | POA: Diagnosis not present

## 2017-05-12 DIAGNOSIS — N186 End stage renal disease: Secondary | ICD-10-CM | POA: Diagnosis not present

## 2017-05-12 DIAGNOSIS — D631 Anemia in chronic kidney disease: Secondary | ICD-10-CM | POA: Diagnosis not present

## 2017-05-12 DIAGNOSIS — N2581 Secondary hyperparathyroidism of renal origin: Secondary | ICD-10-CM | POA: Diagnosis not present

## 2017-05-12 DIAGNOSIS — E1129 Type 2 diabetes mellitus with other diabetic kidney complication: Secondary | ICD-10-CM | POA: Diagnosis not present

## 2017-05-13 ENCOUNTER — Ambulatory Visit: Payer: Medicare Other | Admitting: Podiatry

## 2017-05-14 DIAGNOSIS — E1129 Type 2 diabetes mellitus with other diabetic kidney complication: Secondary | ICD-10-CM | POA: Diagnosis not present

## 2017-05-14 DIAGNOSIS — D631 Anemia in chronic kidney disease: Secondary | ICD-10-CM | POA: Diagnosis not present

## 2017-05-14 DIAGNOSIS — Z23 Encounter for immunization: Secondary | ICD-10-CM | POA: Diagnosis not present

## 2017-05-14 DIAGNOSIS — N2581 Secondary hyperparathyroidism of renal origin: Secondary | ICD-10-CM | POA: Diagnosis not present

## 2017-05-14 DIAGNOSIS — N186 End stage renal disease: Secondary | ICD-10-CM | POA: Diagnosis not present

## 2017-05-17 ENCOUNTER — Ambulatory Visit (INDEPENDENT_AMBULATORY_CARE_PROVIDER_SITE_OTHER): Payer: Medicare Other | Admitting: Podiatry

## 2017-05-17 ENCOUNTER — Ambulatory Visit (INDEPENDENT_AMBULATORY_CARE_PROVIDER_SITE_OTHER): Payer: Medicare Other

## 2017-05-17 ENCOUNTER — Other Ambulatory Visit: Payer: Self-pay | Admitting: Podiatry

## 2017-05-17 DIAGNOSIS — M7751 Other enthesopathy of right foot: Secondary | ICD-10-CM | POA: Diagnosis not present

## 2017-05-17 DIAGNOSIS — E1129 Type 2 diabetes mellitus with other diabetic kidney complication: Secondary | ICD-10-CM | POA: Diagnosis not present

## 2017-05-17 DIAGNOSIS — M778 Other enthesopathies, not elsewhere classified: Secondary | ICD-10-CM

## 2017-05-17 DIAGNOSIS — N2581 Secondary hyperparathyroidism of renal origin: Secondary | ICD-10-CM | POA: Diagnosis not present

## 2017-05-17 DIAGNOSIS — M7752 Other enthesopathy of left foot: Secondary | ICD-10-CM

## 2017-05-17 DIAGNOSIS — E1151 Type 2 diabetes mellitus with diabetic peripheral angiopathy without gangrene: Secondary | ICD-10-CM | POA: Diagnosis not present

## 2017-05-17 DIAGNOSIS — Z23 Encounter for immunization: Secondary | ICD-10-CM | POA: Diagnosis not present

## 2017-05-17 DIAGNOSIS — N186 End stage renal disease: Secondary | ICD-10-CM | POA: Diagnosis not present

## 2017-05-17 DIAGNOSIS — L97511 Non-pressure chronic ulcer of other part of right foot limited to breakdown of skin: Secondary | ICD-10-CM | POA: Diagnosis not present

## 2017-05-17 DIAGNOSIS — I70201 Unspecified atherosclerosis of native arteries of extremities, right leg: Secondary | ICD-10-CM | POA: Diagnosis not present

## 2017-05-17 DIAGNOSIS — M869 Osteomyelitis, unspecified: Secondary | ICD-10-CM | POA: Diagnosis not present

## 2017-05-17 DIAGNOSIS — I739 Peripheral vascular disease, unspecified: Secondary | ICD-10-CM

## 2017-05-17 DIAGNOSIS — D631 Anemia in chronic kidney disease: Secondary | ICD-10-CM | POA: Diagnosis not present

## 2017-05-17 DIAGNOSIS — M779 Enthesopathy, unspecified: Principal | ICD-10-CM

## 2017-05-17 DIAGNOSIS — M79676 Pain in unspecified toe(s): Secondary | ICD-10-CM | POA: Diagnosis not present

## 2017-05-17 MED ORDER — DOXYCYCLINE HYCLATE 100 MG PO TABS: 100.0000 mg | ORAL_TABLET | Freq: Two times a day (BID) | ORAL | 0 refills | Status: DC

## 2017-05-17 MED ORDER — MUPIROCIN 2 % EX OINT
1.0000 | TOPICAL_OINTMENT | Freq: Two times a day (BID) | CUTANEOUS | 2 refills | Status: DC
Start: 2017-05-17 — End: 2017-11-16

## 2017-05-18 ENCOUNTER — Telehealth: Payer: Self-pay | Admitting: *Deleted

## 2017-05-18 DIAGNOSIS — I70201 Unspecified atherosclerosis of native arteries of extremities, right leg: Secondary | ICD-10-CM

## 2017-05-18 DIAGNOSIS — E1151 Type 2 diabetes mellitus with diabetic peripheral angiopathy without gangrene: Secondary | ICD-10-CM

## 2017-05-18 DIAGNOSIS — I739 Peripheral vascular disease, unspecified: Secondary | ICD-10-CM

## 2017-05-18 DIAGNOSIS — M869 Osteomyelitis, unspecified: Secondary | ICD-10-CM

## 2017-05-18 DIAGNOSIS — L97511 Non-pressure chronic ulcer of other part of right foot limited to breakdown of skin: Secondary | ICD-10-CM

## 2017-05-18 DIAGNOSIS — M79676 Pain in unspecified toe(s): Secondary | ICD-10-CM

## 2017-05-18 NOTE — Progress Notes (Addendum)
Subjective: Glendoris presents to the office today with her grandson for concerns of pain to both of her feet.  She states that she is getting some drainage to the right big toe on the wound.  She was using the Silvadene but this caused a lot of pain so she stopped using it.  She has not been wearing surgical shoe she has been wearing a regular shoe although it causes pain to her feet.  He also said the left fourth toe has been painful as well as the right fifth toe.  On the left foot she denies any sores or swelling or redness.  She has no other concerns.  No recent injury or trauma. Denies any systemic complaints such as fevers, chills, nausea, vomiting. No acute changes since last appointment, and no other complaints at this time.   Objective: AAO x3, NAD DP/PT pulses decreased bilaterally Previous auto amputation of the left hallux was present. Mild swelling to the left fourth toe but there is no erythema or increase in warmth.  No significant hyperkeratotic tissue and there is no ulceration.  Hammertoe contractures present.  Mild tenderness palpation to the digit. On the right foot to the distal aspect of the hallux continues to be ulceration which measures approximately 1 x 1 cm with an eschar overlying the wound.  No drainage or pus was expressed today unable to probe to bone.  There is mild edema but there is no significant erythema or increased warmth.  There is no ascending cellulitis.  No fluctuation or crepitation. Hyperkeratotic lesion to the dorsal lateral aspect of the right fifth digit.  Upon debridement there was a ulceration present however unable to probe to bone.  The wound measures approximately 0.2 x 0.2 x 0.2 cm.  Mild edema of the digit there is no erythema or increase in warmth.  No fluctuation or crepitation. No open lesions or pre-ulcerative lesions.  No pain with calf compression, swelling, warmth, erythema  Assessment: Left fourth toe pain with right hallux  ulceration/osteomyelitis, wound right fifth toe  Plan: -All treatment options discussed with the patient including all alternatives, risks, complications.  -X-rays were obtained and reviewed.  No evidence of acute fracture identified on the left foot.  Chronic change of the hallux.  There is cortical destruction of the distal phalanx of the right hallux consistent with osteomyelitis but there is no soft tissue emphysema. -Regards to the left foot there was thick toenail which I sharply debrided.  Offloading pads were also dispensed for the toe.  There is no skin breakdown or signs of infection but continue to monitor closely. -On the right foot the wound was cleaned.  Unable to identify any drainage to the area was not cultured today.  Given the x-ray changes consistent with osteomyelitis we discussed surgical as well as conservative treatment.  Given circulation issues are to hold off on surgery at this time to discuss this is a possibility.  In regards to treatment for the osteomyelitis and ulceration would be a referral in for the wound care center for possible hyperbaric oxygen therapy has she had this previous on the left side.  Also infectious disease consultation.  She and her family member agreed at this time no further questions or concerns about this.  Surgical shoe at all times. -Hyperkeratotic tissue to the right fifth toe was sharply debrided to reveal underlying ulceration.  The wound was excisionally debrided to healthy, granular tissue to help with healing and to remove all nonviable tissue.  Antibiotic ointment was applied followed by a bandage.  I want her to continue with mupirocin ointment dressing changes daily. -Mupirocin ointment to the wound on the right side daily. -We will start doxycycline and likely do for 6 weeks due to osteomyelitis  -Follow-up in 2 weeks or sooner if needed.  Call any questions or concerns meantime.  She verbalized understanding and agrees this  plan.  Vivi BarrackMatthew R Alle Difabio DPM  -Patient encouraged to call the office with any questions, concerns, change in symptoms.

## 2017-05-18 NOTE — Telephone Encounter (Signed)
-----   Message from Vivi BarrackMatthew R Wagoner, DPM sent at 05/18/2017 11:37 AM EST ----- She has osteomyelitis of her right hallux and ulceration with PAD. Can you please put in for a wound care referral for HBO and also a referral for infectious disease.

## 2017-05-18 NOTE — Telephone Encounter (Signed)
Faxed referrals, clinicals and demographics to [facility], and Infectious Disease.

## 2017-05-19 DIAGNOSIS — D631 Anemia in chronic kidney disease: Secondary | ICD-10-CM | POA: Diagnosis not present

## 2017-05-19 DIAGNOSIS — N186 End stage renal disease: Secondary | ICD-10-CM | POA: Diagnosis not present

## 2017-05-19 DIAGNOSIS — E1129 Type 2 diabetes mellitus with other diabetic kidney complication: Secondary | ICD-10-CM | POA: Diagnosis not present

## 2017-05-19 DIAGNOSIS — Z23 Encounter for immunization: Secondary | ICD-10-CM | POA: Diagnosis not present

## 2017-05-19 DIAGNOSIS — N2581 Secondary hyperparathyroidism of renal origin: Secondary | ICD-10-CM | POA: Diagnosis not present

## 2017-05-21 DIAGNOSIS — N2581 Secondary hyperparathyroidism of renal origin: Secondary | ICD-10-CM | POA: Diagnosis not present

## 2017-05-21 DIAGNOSIS — E1129 Type 2 diabetes mellitus with other diabetic kidney complication: Secondary | ICD-10-CM | POA: Diagnosis not present

## 2017-05-21 DIAGNOSIS — Z23 Encounter for immunization: Secondary | ICD-10-CM | POA: Diagnosis not present

## 2017-05-21 DIAGNOSIS — D631 Anemia in chronic kidney disease: Secondary | ICD-10-CM | POA: Diagnosis not present

## 2017-05-21 DIAGNOSIS — N186 End stage renal disease: Secondary | ICD-10-CM | POA: Diagnosis not present

## 2017-05-23 ENCOUNTER — Ambulatory Visit: Payer: Medicare Other | Admitting: Podiatry

## 2017-05-23 ENCOUNTER — Telehealth: Payer: Self-pay | Admitting: *Deleted

## 2017-05-23 NOTE — Telephone Encounter (Signed)
[name] states she is calling to find out information concerning pt's appt with the [facility]ound Care Center. I reviewed the Appts and Referrals and [facility] had tried to contact pt once. I left message for [name] informing her of the Wound Center's call and left their [phone] to call for an appt.

## 2017-05-24 DIAGNOSIS — E1129 Type 2 diabetes mellitus with other diabetic kidney complication: Secondary | ICD-10-CM | POA: Diagnosis not present

## 2017-05-24 DIAGNOSIS — N2581 Secondary hyperparathyroidism of renal origin: Secondary | ICD-10-CM | POA: Diagnosis not present

## 2017-05-24 DIAGNOSIS — Z23 Encounter for immunization: Secondary | ICD-10-CM | POA: Diagnosis not present

## 2017-05-24 DIAGNOSIS — D631 Anemia in chronic kidney disease: Secondary | ICD-10-CM | POA: Diagnosis not present

## 2017-05-24 DIAGNOSIS — N186 End stage renal disease: Secondary | ICD-10-CM | POA: Diagnosis not present

## 2017-05-26 DIAGNOSIS — Z23 Encounter for immunization: Secondary | ICD-10-CM | POA: Diagnosis not present

## 2017-05-26 DIAGNOSIS — N186 End stage renal disease: Secondary | ICD-10-CM | POA: Diagnosis not present

## 2017-05-26 DIAGNOSIS — E1129 Type 2 diabetes mellitus with other diabetic kidney complication: Secondary | ICD-10-CM | POA: Diagnosis not present

## 2017-05-26 DIAGNOSIS — N2581 Secondary hyperparathyroidism of renal origin: Secondary | ICD-10-CM | POA: Diagnosis not present

## 2017-05-26 DIAGNOSIS — D631 Anemia in chronic kidney disease: Secondary | ICD-10-CM | POA: Diagnosis not present

## 2017-05-28 DIAGNOSIS — N186 End stage renal disease: Secondary | ICD-10-CM | POA: Diagnosis not present

## 2017-05-28 DIAGNOSIS — N2581 Secondary hyperparathyroidism of renal origin: Secondary | ICD-10-CM | POA: Diagnosis not present

## 2017-05-28 DIAGNOSIS — D631 Anemia in chronic kidney disease: Secondary | ICD-10-CM | POA: Diagnosis not present

## 2017-05-28 DIAGNOSIS — E1129 Type 2 diabetes mellitus with other diabetic kidney complication: Secondary | ICD-10-CM | POA: Diagnosis not present

## 2017-05-28 DIAGNOSIS — Z23 Encounter for immunization: Secondary | ICD-10-CM | POA: Diagnosis not present

## 2017-05-30 ENCOUNTER — Ambulatory Visit (INDEPENDENT_AMBULATORY_CARE_PROVIDER_SITE_OTHER): Payer: Medicare Other | Admitting: Podiatry

## 2017-05-30 ENCOUNTER — Encounter: Payer: Self-pay | Admitting: Podiatry

## 2017-05-30 VITALS — Temp 97.2°F

## 2017-05-30 DIAGNOSIS — I739 Peripheral vascular disease, unspecified: Secondary | ICD-10-CM

## 2017-05-30 DIAGNOSIS — I70201 Unspecified atherosclerosis of native arteries of extremities, right leg: Secondary | ICD-10-CM | POA: Diagnosis not present

## 2017-05-30 DIAGNOSIS — L97519 Non-pressure chronic ulcer of other part of right foot with unspecified severity: Secondary | ICD-10-CM | POA: Diagnosis not present

## 2017-05-30 DIAGNOSIS — M869 Osteomyelitis, unspecified: Secondary | ICD-10-CM

## 2017-05-30 MED ORDER — CEPHALEXIN 500 MG PO CAPS: 500.0000 mg | ORAL_CAPSULE | Freq: Two times a day (BID) | ORAL | 2 refills | Status: DC

## 2017-05-31 DIAGNOSIS — D631 Anemia in chronic kidney disease: Secondary | ICD-10-CM | POA: Diagnosis not present

## 2017-05-31 DIAGNOSIS — Z23 Encounter for immunization: Secondary | ICD-10-CM | POA: Diagnosis not present

## 2017-05-31 DIAGNOSIS — N186 End stage renal disease: Secondary | ICD-10-CM | POA: Diagnosis not present

## 2017-05-31 DIAGNOSIS — N2581 Secondary hyperparathyroidism of renal origin: Secondary | ICD-10-CM | POA: Diagnosis not present

## 2017-05-31 DIAGNOSIS — E1129 Type 2 diabetes mellitus with other diabetic kidney complication: Secondary | ICD-10-CM | POA: Diagnosis not present

## 2017-06-01 MED ORDER — DOXYCYCLINE HYCLATE 100 MG PO TABS: 100.0000 mg | ORAL_TABLET | Freq: Two times a day (BID) | ORAL | 0 refills | Status: DC

## 2017-06-01 NOTE — Progress Notes (Signed)
Subjective: [name] presents to the office today with a family member for follow-up evaluation of an ulcer on the right hallux/osteomyelitis. She states that she is doing well. She did take the doxycycline but it started to cause diarrhea. She has not had symptoms since finishing the antibiotics. She notices a small amount of bleeding to the ulcer but no pus. No increase in swelling and no red streaks. She has remained in the surgical shoe. Denies any systemic complaints such as fevers, chills, nausea, vomiting. No acute changes since last appointment, and no other complaints at this time.   Objective: AAO x3, NAD DP/PT pulses decreased bilaterally Previous auto amputation of the left hallux was present. To the RIGHT hallux there is an ulceration to the distal aspect of the toe with a pain as sharp.  There is granulation tissue around the area.  There is epidermal lysis along the wound going underneath the distal portion of the toenail the nail is starting to loosen from the underlying nail bed.  I was able to debride some of the loose toenail today as well.  There is no probing to bone and there is no undermining or tunneling.  There is mild edema to the toe but there is no erythema or increase in warmth.  There is no ascending cellulitis.  There is no fluctuation or crepitation.  Wound on the right fifth toe is healed No open lesions or pre-ulcerative lesions.  No pain with calf compression, swelling, warmth, erythema      Assessment: Left fourth toe pain with right hallux ulceration/osteomyelitis  Plan: -All treatment options discussed with the patient including all alternatives, risks, complications.  -I sharply debrided the loose epidermal lysis to the right hallux.  Also debrided part of the toenail which is loose today.  The wound does not appear to be healing.  I would like for her to continue with antibiotics.  Will change to Keflex.  She has an appointment scheduled with the wound care  center on February 4.  Also recommend follow-up evaluation with vascular surgery, Dr. Randie Heinzain. -Continue surgical shoes. -Monitor for any clinical signs or symptoms of infection and directed to call the office immediately should any occur or go to the ER. -Follow-up in 1 week or sooner if needed.  Call any questions or concerns.  Vivi BarrackMatthew R Bexlee Bergdoll DPM

## 2017-06-02 ENCOUNTER — Telehealth: Payer: Self-pay | Admitting: *Deleted

## 2017-06-02 DIAGNOSIS — M79676 Pain in unspecified toe(s): Secondary | ICD-10-CM

## 2017-06-02 DIAGNOSIS — Z23 Encounter for immunization: Secondary | ICD-10-CM | POA: Diagnosis not present

## 2017-06-02 DIAGNOSIS — N2581 Secondary hyperparathyroidism of renal origin: Secondary | ICD-10-CM | POA: Diagnosis not present

## 2017-06-02 DIAGNOSIS — N186 End stage renal disease: Secondary | ICD-10-CM | POA: Diagnosis not present

## 2017-06-02 DIAGNOSIS — D631 Anemia in chronic kidney disease: Secondary | ICD-10-CM | POA: Diagnosis not present

## 2017-06-02 DIAGNOSIS — I739 Peripheral vascular disease, unspecified: Secondary | ICD-10-CM

## 2017-06-02 DIAGNOSIS — M869 Osteomyelitis, unspecified: Secondary | ICD-10-CM

## 2017-06-02 DIAGNOSIS — L97511 Non-pressure chronic ulcer of other part of right foot limited to breakdown of skin: Secondary | ICD-10-CM

## 2017-06-02 DIAGNOSIS — E1129 Type 2 diabetes mellitus with other diabetic kidney complication: Secondary | ICD-10-CM | POA: Diagnosis not present

## 2017-06-02 NOTE — Telephone Encounter (Signed)
Faxed referral to [facility] - Dr. Lemar LivingsBrandon Cain for follow up on nonhealing ulcer of right hallux.

## 2017-06-02 NOTE — Telephone Encounter (Signed)
-----   Message from Vivi BarrackMatthew R Wagoner, DPM sent at 06/01/2017  3:03 PM EST ----- Can you please schedule a follow-up with vascular surgery, Dr. Randie Heinzain due to nonhealing ulceration right hallux thank you

## 2017-06-04 DIAGNOSIS — E1129 Type 2 diabetes mellitus with other diabetic kidney complication: Secondary | ICD-10-CM | POA: Diagnosis not present

## 2017-06-04 DIAGNOSIS — N186 End stage renal disease: Secondary | ICD-10-CM | POA: Diagnosis not present

## 2017-06-04 DIAGNOSIS — Z23 Encounter for immunization: Secondary | ICD-10-CM | POA: Diagnosis not present

## 2017-06-04 DIAGNOSIS — N2581 Secondary hyperparathyroidism of renal origin: Secondary | ICD-10-CM | POA: Diagnosis not present

## 2017-06-04 DIAGNOSIS — D631 Anemia in chronic kidney disease: Secondary | ICD-10-CM | POA: Diagnosis not present

## 2017-06-06 ENCOUNTER — Ambulatory Visit (INDEPENDENT_AMBULATORY_CARE_PROVIDER_SITE_OTHER): Payer: Medicare Other | Admitting: Podiatry

## 2017-06-06 ENCOUNTER — Ambulatory Visit (INDEPENDENT_AMBULATORY_CARE_PROVIDER_SITE_OTHER): Payer: Medicare Other

## 2017-06-06 ENCOUNTER — Other Ambulatory Visit: Payer: Self-pay | Admitting: Podiatry

## 2017-06-06 DIAGNOSIS — I739 Peripheral vascular disease, unspecified: Secondary | ICD-10-CM | POA: Diagnosis not present

## 2017-06-06 DIAGNOSIS — M79674 Pain in right toe(s): Secondary | ICD-10-CM

## 2017-06-06 DIAGNOSIS — I70201 Unspecified atherosclerosis of native arteries of extremities, right leg: Secondary | ICD-10-CM | POA: Diagnosis not present

## 2017-06-06 DIAGNOSIS — L97511 Non-pressure chronic ulcer of other part of right foot limited to breakdown of skin: Secondary | ICD-10-CM

## 2017-06-06 DIAGNOSIS — M869 Osteomyelitis, unspecified: Secondary | ICD-10-CM | POA: Diagnosis not present

## 2017-06-07 ENCOUNTER — Telehealth: Payer: Self-pay | Admitting: *Deleted

## 2017-06-07 DIAGNOSIS — D631 Anemia in chronic kidney disease: Secondary | ICD-10-CM | POA: Diagnosis not present

## 2017-06-07 DIAGNOSIS — Z23 Encounter for immunization: Secondary | ICD-10-CM | POA: Diagnosis not present

## 2017-06-07 DIAGNOSIS — E1129 Type 2 diabetes mellitus with other diabetic kidney complication: Secondary | ICD-10-CM | POA: Diagnosis not present

## 2017-06-07 DIAGNOSIS — N186 End stage renal disease: Secondary | ICD-10-CM | POA: Diagnosis not present

## 2017-06-07 DIAGNOSIS — N2581 Secondary hyperparathyroidism of renal origin: Secondary | ICD-10-CM | POA: Diagnosis not present

## 2017-06-07 NOTE — Telephone Encounter (Signed)
-----   Message from Vivi BarrackMatthew R Wagoner, DPM sent at 06/06/2017  4:18 PM EST ----- Can you please put in for her to see vascular surgery again? She has not seen them in a year and has a nonhealing ulcer right hallux. Thanks.

## 2017-06-07 NOTE — Progress Notes (Signed)
Subjective: [name] presents to the office today with a family member for follow-up evaluation of an ulcer on the right hallux/osteomyelitis.  She is continue with Keflex and she states that she is tolerating this well.  She started the Keflex on January 21.  Prior to that she did do a 10-day course of doxycycline.  She has been continuing with the surgical shoe as well.  She states the wound is about the same.  She has an appointment with the wound care center on February 4.  She has not seen vascular surgery in some time.  She has not yet heard from the metabolic appointment.  Denies any systemic complaints such as fevers, chills, nausea, vomiting. No acute changes since last appointment, and no other complaints at this time.   Objective: AAO x3, NAD DP/PT pulses decreased bilaterally Previous auto amputation of the left hallux was present. To the RIGHT hallux there is an ulceration to the distal aspect of the toe with a fibrotic capsule surrounding granular tissue.  There is no drainage or pus expressed there is no ascending cellulitis.  There is starting to be some hyperpigmentation to the dorsal portion of the toe.  There is no fluctuation or crepitation.  No malodor.  Hyperkeratotic lesion to the dorsal lateral fifth digit.  No underlying ulceration drainage or any signs of infection. No open lesions or pre-ulcerative lesions.  No pain with calf compression, swelling, warmth, erythema      Assessment: Left fourth toe pain with right hallux ulceration/osteomyelitis  Plan: -All treatment options discussed with the patient including all alternatives, risks, complications.  -Repeat x-rays were obtained.  Evidence of cortical destruction of the distal hallux suggestive of osteomyelitis.  No soft tissue emphysema is present.  There is a radiolucency to this corresponding to the wound. -Continue Betadine wet-to-dry dressing changes. -Keflex for now. -She has appointment with wound care center on  February 4.  Also encouraged her to make an appointment with vascular surgery.  We will put in a new referral to help expedite this.  Also encouraged her to call them for an appointment. -Monitor for any clinical signs or symptoms of infection and directed to call the office immediately should any occur or go to the ER. -I will see her back in 2 weeks as she follows up with wound care center next week.  Vivi BarrackMatthew R Jontae [name] DPM

## 2017-06-07 NOTE — Telephone Encounter (Signed)
Vascular referral made 06/02/2017.

## 2017-06-09 DIAGNOSIS — E1129 Type 2 diabetes mellitus with other diabetic kidney complication: Secondary | ICD-10-CM | POA: Diagnosis not present

## 2017-06-09 DIAGNOSIS — N2581 Secondary hyperparathyroidism of renal origin: Secondary | ICD-10-CM | POA: Diagnosis not present

## 2017-06-09 DIAGNOSIS — Z992 Dependence on renal dialysis: Secondary | ICD-10-CM | POA: Diagnosis not present

## 2017-06-09 DIAGNOSIS — D631 Anemia in chronic kidney disease: Secondary | ICD-10-CM | POA: Diagnosis not present

## 2017-06-09 DIAGNOSIS — Z23 Encounter for immunization: Secondary | ICD-10-CM | POA: Diagnosis not present

## 2017-06-09 DIAGNOSIS — N186 End stage renal disease: Secondary | ICD-10-CM | POA: Diagnosis not present

## 2017-06-10 DIAGNOSIS — Z992 Dependence on renal dialysis: Secondary | ICD-10-CM | POA: Diagnosis not present

## 2017-06-10 DIAGNOSIS — N186 End stage renal disease: Secondary | ICD-10-CM | POA: Diagnosis not present

## 2017-06-10 DIAGNOSIS — E1129 Type 2 diabetes mellitus with other diabetic kidney complication: Secondary | ICD-10-CM | POA: Diagnosis not present

## 2017-06-11 DIAGNOSIS — N186 End stage renal disease: Secondary | ICD-10-CM | POA: Diagnosis not present

## 2017-06-11 DIAGNOSIS — E1129 Type 2 diabetes mellitus with other diabetic kidney complication: Secondary | ICD-10-CM | POA: Diagnosis not present

## 2017-06-11 DIAGNOSIS — N2581 Secondary hyperparathyroidism of renal origin: Secondary | ICD-10-CM | POA: Diagnosis not present

## 2017-06-11 DIAGNOSIS — Z992 Dependence on renal dialysis: Secondary | ICD-10-CM | POA: Diagnosis not present

## 2017-06-11 DIAGNOSIS — D631 Anemia in chronic kidney disease: Secondary | ICD-10-CM | POA: Diagnosis not present

## 2017-06-13 ENCOUNTER — Encounter (HOSPITAL_BASED_OUTPATIENT_CLINIC_OR_DEPARTMENT_OTHER): Payer: Medicare Other | Attending: Internal Medicine

## 2017-06-13 DIAGNOSIS — E1151 Type 2 diabetes mellitus with diabetic peripheral angiopathy without gangrene: Secondary | ICD-10-CM | POA: Diagnosis not present

## 2017-06-13 DIAGNOSIS — L97514 Non-pressure chronic ulcer of other part of right foot with necrosis of bone: Secondary | ICD-10-CM | POA: Diagnosis not present

## 2017-06-13 DIAGNOSIS — N186 End stage renal disease: Secondary | ICD-10-CM | POA: Diagnosis not present

## 2017-06-13 DIAGNOSIS — Z992 Dependence on renal dialysis: Secondary | ICD-10-CM | POA: Insufficient documentation

## 2017-06-13 DIAGNOSIS — L97516 Non-pressure chronic ulcer of other part of right foot with bone involvement without evidence of necrosis: Secondary | ICD-10-CM | POA: Diagnosis not present

## 2017-06-13 DIAGNOSIS — E1140 Type 2 diabetes mellitus with diabetic neuropathy, unspecified: Secondary | ICD-10-CM | POA: Insufficient documentation

## 2017-06-13 DIAGNOSIS — E1142 Type 2 diabetes mellitus with diabetic polyneuropathy: Secondary | ICD-10-CM | POA: Diagnosis not present

## 2017-06-13 DIAGNOSIS — E11621 Type 2 diabetes mellitus with foot ulcer: Secondary | ICD-10-CM | POA: Diagnosis not present

## 2017-06-13 DIAGNOSIS — M86371 Chronic multifocal osteomyelitis, right ankle and foot: Secondary | ICD-10-CM | POA: Insufficient documentation

## 2017-06-13 DIAGNOSIS — E1169 Type 2 diabetes mellitus with other specified complication: Secondary | ICD-10-CM | POA: Insufficient documentation

## 2017-06-13 DIAGNOSIS — M8668 Other chronic osteomyelitis, other site: Secondary | ICD-10-CM | POA: Diagnosis not present

## 2017-06-14 ENCOUNTER — Other Ambulatory Visit: Payer: Self-pay

## 2017-06-14 DIAGNOSIS — N2581 Secondary hyperparathyroidism of renal origin: Secondary | ICD-10-CM | POA: Diagnosis not present

## 2017-06-14 DIAGNOSIS — E1129 Type 2 diabetes mellitus with other diabetic kidney complication: Secondary | ICD-10-CM | POA: Diagnosis not present

## 2017-06-14 DIAGNOSIS — D631 Anemia in chronic kidney disease: Secondary | ICD-10-CM | POA: Diagnosis not present

## 2017-06-14 DIAGNOSIS — I739 Peripheral vascular disease, unspecified: Secondary | ICD-10-CM

## 2017-06-14 DIAGNOSIS — Z992 Dependence on renal dialysis: Secondary | ICD-10-CM | POA: Diagnosis not present

## 2017-06-14 DIAGNOSIS — N186 End stage renal disease: Secondary | ICD-10-CM | POA: Diagnosis not present

## 2017-06-15 ENCOUNTER — Ambulatory Visit (HOSPITAL_COMMUNITY)
Admission: RE | Admit: 2017-06-15 | Discharge: 2017-06-15 | Disposition: A | Discharge status: Home / Self Care | Payer: Medicare Other | Source: Ambulatory Visit | Attending: Vascular Surgery | Admitting: Vascular Surgery

## 2017-06-15 DIAGNOSIS — I739 Peripheral vascular disease, unspecified: Secondary | ICD-10-CM

## 2017-06-15 DIAGNOSIS — L97519 Non-pressure chronic ulcer of other part of right foot with unspecified severity: Secondary | ICD-10-CM | POA: Insufficient documentation

## 2017-06-15 DIAGNOSIS — E11621 Type 2 diabetes mellitus with foot ulcer: Secondary | ICD-10-CM | POA: Insufficient documentation

## 2017-06-15 DIAGNOSIS — Z8673 Personal history of transient ischemic attack (TIA), and cerebral infarction without residual deficits: Secondary | ICD-10-CM | POA: Diagnosis not present

## 2017-06-15 DIAGNOSIS — R600 Localized edema: Secondary | ICD-10-CM | POA: Insufficient documentation

## 2017-06-16 DIAGNOSIS — E1129 Type 2 diabetes mellitus with other diabetic kidney complication: Secondary | ICD-10-CM | POA: Diagnosis not present

## 2017-06-16 DIAGNOSIS — N186 End stage renal disease: Secondary | ICD-10-CM | POA: Diagnosis not present

## 2017-06-16 DIAGNOSIS — D631 Anemia in chronic kidney disease: Secondary | ICD-10-CM | POA: Diagnosis not present

## 2017-06-16 DIAGNOSIS — Z992 Dependence on renal dialysis: Secondary | ICD-10-CM | POA: Diagnosis not present

## 2017-06-16 DIAGNOSIS — N2581 Secondary hyperparathyroidism of renal origin: Secondary | ICD-10-CM | POA: Diagnosis not present

## 2017-06-16 NOTE — H&P (View-Only) (Signed)
Established Critical Limb Ischemia Patient   History of Present Illness   [name] is a 84 y.o. (14-Sep-1932) female who presents with chief complaint: non-healing right great toe.  This has known bilateral foot tissue loss.  She underwent angiography with Dr. Randie Heinz on 07/05/16 which demonstrated: multiple <50% stenoses with peroneal runoff on the left side.  She eventually healed her L foot ulcers.  The patient also has a right great toe that has not healed.  Onset these wounds was a few months ago.  [facility] of her R great toe ulceration.  Pt has never followed up on her multiple procedures.  In 2017, I explored the left common femoral artery for placement of a thigh arteriovenous graft.  This artery was found to be so calcified a vascular clamp could not compress the artery.  The patient has no rest pain.  The patient notes symptoms have not progressed.  The patient's treatment regimen currently included: maximal medical management and wound care.  Past Medical History:  Diagnosis Date  . Anemia   . C. difficile diarrhea   . Chronic kidney disease    on Dialysis  . Depression   . Diabetes mellitus   . Diabetic neuropathy (HCC)   . GERD (gastroesophageal reflux disease)   . Gout   . Hyperparathyroidism   . MRSA (methicillin resistant staph aureus) culture positive   . Obesity   . Osteoarthritis   . Peripheral arterial disease (HCC)    Gangrene-Left Great Toe  . Shortness of breath dyspnea    with exertion  . Stroke [facility])     Past Surgical History:  Procedure Laterality Date  . ABDOMINAL AORTOGRAM N/A 07/05/2016   Procedure: Abdominal Aortogram;  Surgeon: Maeola Harman, MD;  Location: [facility] INVASIVE CV LAB;  Service: Cardiovascular;  Laterality: N/A;  . ABDOMINAL HYSTERECTOMY    . AV FISTULA PLACEMENT Left 07/07/2015   Procedure: ATTEMPTED INSERTION OFLEFT  ARTERIOVENOUS (AV) GORE-TEX GRAFT THIGH;  Surgeon: Fransisco Hertz, MD;   Location: [facility] OR;  Service: Vascular;  Laterality: Left;  . BREAST EXCISIONAL BIOPSY Right 05/25/2006  . COLONOSCOPY    . LOWER EXTREMITY ANGIOGRAPHY Bilateral 07/05/2016   Procedure: Lower Extremity Angiography;  Surgeon: Maeola Harman, MD;  Location: [facility] INVASIVE CV LAB;  Service: Cardiovascular;  Laterality: Bilateral;  . MULTIPLE TOOTH EXTRACTIONS    . SHUNTOGRAM Right 09/06/12   Thigh graft  . SP DECLOT AVGG Right Sep 14, 2012   Right thigh graft    Social History   Socioeconomic History  . Marital status: Divorced    Spouse name: Not on file  . Number of children: 0  . Years of education: college  . Highest education level: Not on file  Social Needs  . Financial resource strain: Not on file  . Food insecurity - worry: Not on file  . Food insecurity - inability: Not on file  . Transportation needs - medical: Not on file  . Transportation needs - non-medical: Not on file  Occupational History  . Occupation: retired    Associate Professor: RETIRED  Tobacco Use  . Smoking status: Former Smoker    Types: Cigarettes  . Smokeless tobacco: Never Used  Substance and Sexual Activity  . Alcohol use: No  . Drug use: No  . Sexual activity: No  Other Topics Concern  . Not on file  Social History Narrative  . Not on file    Family History  Problem Relation Age of Onset  . Cancer Father        type unknown  . Stroke Mother   . Diabetes Sister   . Cancer Brother        type unknown    Current Outpatient Medications  Medication Sig Dispense Refill  . acetaminophen (TYLENOL) 500 MG tablet Take 500-1,000 mg by mouth every 6 (six) hours as needed for mild pain.     Marland Kitchen. aspirin 81 MG tablet Take 81 mg by mouth daily.     . cadexomer iodine (IODOSORB) 0.9 % gel Apply 1 application topically daily as needed for wound care.    . cephALEXin (KEFLEX) 500 MG capsule Take 1 capsule (500 mg total) by mouth 2 (two) times daily. 30 capsule 2  . Doxercalciferol (HECTOROL) 2 MCG/ML SOLN  Inject 3 mcg into the vein 3 (three) times a week.    . gabapentin (NEURONTIN) 100 MG capsule Take 100 mg by mouth at bedtime.     . IRON SUCROSE IV Inject 1 Dose into the vein once a week.    . Methoxy PEG-Epoetin Beta (MIRCERA) 75 MCG/0.3ML SOSY Inject 75 mcg as directed every 14 (fourteen) days.    . mupirocin ointment (BACTROBAN) 2 % Apply 1 application topically 2 (two) times daily. 30 g 2  . RENVELA 800 MG tablet Take 1,600-2,400 mg by mouth 3 (three) times daily with meals. 2400mg  with meals, 1600mg  with snacks    . SENSIPAR 30 MG tablet Take 30 mg by mouth every evening.     . silver sulfADIAZINE (SILVADENE) 1 % cream Apply 1 application topically daily. 50 g 0  . oxyCODONE-acetaminophen (PERCOCET/ROXICET) 5-325 MG tablet Take 1 tablet by mouth every 6 (six) hours as needed. (Patient not taking: Reported on 06/17/2017) 10 tablet 0   No current facility-administered medications for this visit.      Allergies  Allergen Reactions  . Codeine Hives  . Vancomycin Rash    REVIEW OF SYSTEMS (negative unless checked):   Cardiac:  []  Chest pain or chest pressure? []  Shortness of breath upon activity? []  Shortness of breath when lying flat? []  Irregular heart rhythm?  Vascular:  []  Pain in calf, thigh, or hip brought on by walking? []  Pain in feet at night that wakes you up from your sleep? []  Blood clot in your veins? []  Leg swelling?  Pulmonary:  []  Oxygen at home? []  Productive cough? []  Wheezing?  Neurologic:  []  Sudden weakness in arms or legs? []  Sudden numbness in arms or legs? []  Sudden onset of difficult speaking or slurred speech? []  Temporary loss of vision in one eye? []  Problems with dizziness?  Gastrointestinal:  []  Blood in stool? []  Vomited blood?  Genitourinary:  []  Burning when urinating? []  Blood in urine? [x]   End stage renal disease-HD: T/R/S  Psychiatric:  []  Major depression  Hematologic:  []  Bleeding problems? []  Problems with blood  clotting?  Dermatologic:  [x]  Rashes or ulcers?  Constitutional:  []  Fever or chills?  Ear/Nose/Throat:  []  Change in hearing? []  Nose bleeds? []  Sore throat?  Musculoskeletal:  []  Back pain? []  Joint pain? []  Muscle pain?   Current Outpatient Medications  Medication Sig Dispense Refill  . acetaminophen (TYLENOL) 500 MG tablet Take 500-1,000 mg by mouth every 6 (six) hours as needed for mild pain.     Marland Kitchen. aspirin 81 MG tablet Take 81 mg by mouth daily.     . cadexomer iodine (IODOSORB) 0.9 % gel Apply  1 application topically daily as needed for wound care.    . cephALEXin (KEFLEX) 500 MG capsule Take 1 capsule (500 mg total) by mouth 2 (two) times daily. 30 capsule 2  . Doxercalciferol (HECTOROL) 2 MCG/ML SOLN Inject 3 mcg into the vein 3 (three) times a week.    . gabapentin (NEURONTIN) 100 MG capsule Take 100 mg by mouth at bedtime.     . IRON SUCROSE IV Inject 1 Dose into the vein once a week.    . Methoxy PEG-Epoetin Beta (MIRCERA) 75 MCG/0.3ML SOSY Inject 75 mcg as directed every 14 (fourteen) days.    . mupirocin ointment (BACTROBAN) 2 % Apply 1 application topically 2 (two) times daily. 30 g 2  . oxyCODONE-acetaminophen (PERCOCET/ROXICET) 5-325 MG tablet Take 1 tablet by mouth every 6 (six) hours as needed. 10 tablet 0  . RENVELA 800 MG tablet Take 1,600-2,400 mg by mouth 3 (three) times daily with meals. 2400mg  with meals, 1600mg  with snacks    . SENSIPAR 30 MG tablet Take 30 mg by mouth every evening.     . silver sulfADIAZINE (SILVADENE) 1 % cream Apply 1 application topically daily. 50 g 0   No current facility-administered medications for this visit.     On ROS today: no fever or chill, persistent right great toe wound   Physical Examination   Vitals:   06/17/17 0942 06/17/17 0948  BP: 93/63 (!) 100/58  Pulse: 69 69  Resp: 16   Temp: (!) 97.3 F (36.3 C)   TempSrc: Oral   SpO2: 95%   Weight: 187 lb (84.8 kg)   Height: 5\' 3"  (1.6 m)    Body mass index  is 33.13 kg/m.  General Alert, O x 3, WD, NAD  Pulmonary Sym exp, good B air movt, CTA B  Cardiac RRR, Nl S1, S2, no Murmurs, No rubs, No S3,S4  Vascular Vessel Right Left  Radial Palpable Palpable  Brachial Palpable Palpable  Carotid Palpable, No Bruit Palpable, No Bruit  Aorta Not palpable N/A  Femoral Palpable Palpable  Popliteal Not palpable Not palpable  PT Not palpable Not palpable  DP Not palpable Not palpable    Gastro- intestinal soft, non-distended, non-tender to palpation, No guarding or rebound, no HSM, no masses, no CVAT B, No palpable prominent aortic pulse,    Musculo- skeletal M/S 5/5 throughout  , R great toe with ulcerated distal phalange, clean without any smell or drainage, R 5th toe with gangrenous changes in distal phalange  Neurologic Pain and light touch intact in extremities , Motor exam as listed above    Non-Invasive Vascular Imaging   RLE Arterial Duplex (06/15/17):  Mild diffuse plaque noted throughout the common femoral, profunda femoral, superficial femoral and popliteal with no focal velocity elevations noted.  Flow in the proximal-mid posterior and anterior tibial arteries was unable to be documented.  The peroneal artery was unable to be visuzlied due to body habitus, edema.  No significant change compared to previous study.   Medical Decision Making   [name] is a 84 y.o. female who presents with: critical limb ischemia with R 1st toe ulceration and R 5th toe dry gangrene.   Based on the patient's vascular studies and examination, I have offered the patient: aortogram, right leg runoff, and possible intervention. I discussed with the patient the nature of angiographic procedures, especially the limited patencies of any endovascular intervention.   The patient is aware of that the risks of an angiographic procedure include  but are not limited to: bleeding, infection, access site complications, renal failure, embolization, rupture of  vessel, dissection, arteriovenous fistula, possible need for emergent surgical intervention, possible need for surgical procedures to treat the patient's pathology, anaphylactic reaction to contrast, and stroke and death.   The patient is aware of the risks and agrees to proceed. This procedure is scheduled with Dr. Darrick Penna on 13 FEB 19.  I discussed in depth with the patient the nature of atherosclerosis, and emphasized the importance of maximal medical management including strict control of blood pressure, blood glucose, and lipid levels, antiplatelet agents, obtaining regular exercise, and cessation of smoking.    The patient is aware that without maximal medical management the underlying atherosclerotic disease process will progress, limiting the benefit of any interventions. The patient is currently not on on statin as not medically indicated.  The patient is currently on an anti-platelet: ASA.  Thank you for allowing [place] to participate in this patient's care.   Leonides Sake, MD, FACS [facility] Office: [phone] Pager: [phone]

## 2017-06-16 NOTE — Progress Notes (Addendum)
Established Critical Limb Ischemia Patient   History of Present Illness   [name] is a 84 y.o. (14-Sep-1932) female who presents with chief complaint: non-healing right great toe.  This has known bilateral foot tissue loss.  She underwent angiography with Dr. Randie Heinz on 07/05/16 which demonstrated: multiple <50% stenoses with peroneal runoff on the left side.  She eventually healed her L foot ulcers.  The patient also has a right great toe that has not healed.  Onset these wounds was a few months ago.  [facility] for [facility]mt of her R great toe ulceration.  Pt has never followed up on her multiple procedures.  In 2017, I explored the left common femoral artery for placement of a thigh arteriovenous graft.  This artery was found to be so calcified a vascular clamp could not compress the artery.  The patient has no rest pain.  The patient notes symptoms have not progressed.  The patient's treatment regimen currently included: maximal medical management and wound care.  Past Medical History:  Diagnosis Date  . Anemia   . C. difficile diarrhea   . Chronic kidney disease    on Dialysis  . Depression   . Diabetes mellitus   . Diabetic neuropathy (HCC)   . GERD (gastroesophageal reflux disease)   . Gout   . Hyperparathyroidism   . MRSA (methicillin resistant staph aureus) culture positive   . Obesity   . Osteoarthritis   . Peripheral arterial disease (HCC)    Gangrene-Left Great Toe  . Shortness of breath dyspnea    with exertion  . Stroke [facility])     Past Surgical History:  Procedure Laterality Date  . ABDOMINAL AORTOGRAM N/A 07/05/2016   Procedure: Abdominal Aortogram;  Surgeon: Maeola Harman, MD;  Location: [facility] INVASIVE CV LAB;  Service: Cardiovascular;  Laterality: N/A;  . ABDOMINAL HYSTERECTOMY    . AV FISTULA PLACEMENT Left 07/07/2015   Procedure: ATTEMPTED INSERTION OFLEFT  ARTERIOVENOUS (AV) GORE-TEX GRAFT THIGH;  Surgeon: Fransisco Hertz, MD;   Location: [facility] OR;  Service: Vascular;  Laterality: Left;  . BREAST EXCISIONAL BIOPSY Right 05/25/2006  . COLONOSCOPY    . LOWER EXTREMITY ANGIOGRAPHY Bilateral 07/05/2016   Procedure: Lower Extremity Angiography;  Surgeon: Maeola Harman, MD;  Location: [facility] INVASIVE CV LAB;  Service: Cardiovascular;  Laterality: Bilateral;  . MULTIPLE TOOTH EXTRACTIONS    . SHUNTOGRAM Right 09/06/12   Thigh graft  . SP DECLOT AVGG Right Sep 14, 2012   Right thigh graft    Social History   Socioeconomic History  . Marital status: Divorced    Spouse name: Not on file  . Number of children: 0  . Years of education: college  . Highest education level: Not on file  Social Needs  . Financial resource strain: Not on file  . Food insecurity - worry: Not on file  . Food insecurity - inability: Not on file  . Transportation needs - medical: Not on file  . Transportation needs - non-medical: Not on file  Occupational History  . Occupation: retired    Associate Professor: RETIRED  Tobacco Use  . Smoking status: Former Smoker    Types: Cigarettes  . Smokeless tobacco: Never Used  Substance and Sexual Activity  . Alcohol use: No  . Drug use: No  . Sexual activity: No  Other Topics Concern  . Not on file  Social History Narrative  . Not on file    Family History  Problem Relation Age of Onset  . Cancer Father        type unknown  . Stroke Mother   . Diabetes Sister   . Cancer Brother        type unknown    Current Outpatient Medications  Medication Sig Dispense Refill  . acetaminophen (TYLENOL) 500 MG tablet Take 500-1,000 mg by mouth every 6 (six) hours as needed for mild pain.     Marland Kitchen. aspirin 81 MG tablet Take 81 mg by mouth daily.     . cadexomer iodine (IODOSORB) 0.9 % gel Apply 1 application topically daily as needed for wound care.    . cephALEXin (KEFLEX) 500 MG capsule Take 1 capsule (500 mg total) by mouth 2 (two) times daily. 30 capsule 2  . Doxercalciferol (HECTOROL) 2 MCG/ML SOLN  Inject 3 mcg into the vein 3 (three) times a week.    . gabapentin (NEURONTIN) 100 MG capsule Take 100 mg by mouth at bedtime.     . IRON SUCROSE IV Inject 1 Dose into the vein once a week.    . Methoxy PEG-Epoetin Beta (MIRCERA) 75 MCG/0.3ML SOSY Inject 75 mcg as directed every 14 (fourteen) days.    . mupirocin ointment (BACTROBAN) 2 % Apply 1 application topically 2 (two) times daily. 30 g 2  . RENVELA 800 MG tablet Take 1,600-2,400 mg by mouth 3 (three) times daily with meals. 2400mg  with meals, 1600mg  with snacks    . SENSIPAR 30 MG tablet Take 30 mg by mouth every evening.     . silver sulfADIAZINE (SILVADENE) 1 % cream Apply 1 application topically daily. 50 g 0  . oxyCODONE-acetaminophen (PERCOCET/ROXICET) 5-325 MG tablet Take 1 tablet by mouth every 6 (six) hours as needed. (Patient not taking: Reported on 06/17/2017) 10 tablet 0   No current facility-administered medications for this visit.      Allergies  Allergen Reactions  . Codeine Hives  . Vancomycin Rash    REVIEW OF SYSTEMS (negative unless checked):   Cardiac:  []  Chest pain or chest pressure? []  Shortness of breath upon activity? []  Shortness of breath when lying flat? []  Irregular heart rhythm?  Vascular:  []  Pain in calf, thigh, or hip brought on by walking? []  Pain in feet at night that wakes you up from your sleep? []  Blood clot in your veins? []  Leg swelling?  Pulmonary:  []  Oxygen at home? []  Productive cough? []  Wheezing?  Neurologic:  []  Sudden weakness in arms or legs? []  Sudden numbness in arms or legs? []  Sudden onset of difficult speaking or slurred speech? []  Temporary loss of vision in one eye? []  Problems with dizziness?  Gastrointestinal:  []  Blood in stool? []  Vomited blood?  Genitourinary:  []  Burning when urinating? []  Blood in urine? [x]   End stage renal disease-HD: T/R/S  Psychiatric:  []  Major depression  Hematologic:  []  Bleeding problems? []  Problems with blood  clotting?  Dermatologic:  [x]  Rashes or ulcers?  Constitutional:  []  Fever or chills?  Ear/Nose/Throat:  []  Change in hearing? []  Nose bleeds? []  Sore throat?  Musculoskeletal:  []  Back pain? []  Joint pain? []  Muscle pain?   Current Outpatient Medications  Medication Sig Dispense Refill  . acetaminophen (TYLENOL) 500 MG tablet Take 500-1,000 mg by mouth every 6 (six) hours as needed for mild pain.     Marland Kitchen. aspirin 81 MG tablet Take 81 mg by mouth daily.     . cadexomer iodine (IODOSORB) 0.9 % gel Apply  1 application topically daily as needed for wound care.    . cephALEXin (KEFLEX) 500 MG capsule Take 1 capsule (500 mg total) by mouth 2 (two) times daily. 30 capsule 2  . Doxercalciferol (HECTOROL) 2 MCG/ML SOLN Inject 3 mcg into the vein 3 (three) times a week.    . gabapentin (NEURONTIN) 100 MG capsule Take 100 mg by mouth at bedtime.     . IRON SUCROSE IV Inject 1 Dose into the vein once a week.    . Methoxy PEG-Epoetin Beta (MIRCERA) 75 MCG/0.3ML SOSY Inject 75 mcg as directed every 14 (fourteen) days.    . mupirocin ointment (BACTROBAN) 2 % Apply 1 application topically 2 (two) times daily. 30 g 2  . oxyCODONE-acetaminophen (PERCOCET/ROXICET) 5-325 MG tablet Take 1 tablet by mouth every 6 (six) hours as needed. 10 tablet 0  . RENVELA 800 MG tablet Take 1,600-2,400 mg by mouth 3 (three) times daily with meals. 2400mg  with meals, 1600mg  with snacks    . SENSIPAR 30 MG tablet Take 30 mg by mouth every evening.     . silver sulfADIAZINE (SILVADENE) 1 % cream Apply 1 application topically daily. 50 g 0   No current facility-administered medications for this visit.     On ROS today: no fever or chill, persistent right great toe wound   Physical Examination   Vitals:   06/17/17 0942 06/17/17 0948  BP: 93/63 (!) 100/58  Pulse: 69 69  Resp: 16   Temp: (!) 97.3 F (36.3 C)   TempSrc: Oral   SpO2: 95%   Weight: 187 lb (84.8 kg)   Height: 5\' 3"  (1.6 m)    Body mass index  is 33.13 kg/m.  General Alert, O x 3, WD, NAD  Pulmonary Sym exp, good B air movt, CTA B  Cardiac RRR, Nl S1, S2, no Murmurs, No rubs, No S3,S4  Vascular Vessel Right Left  Radial Palpable Palpable  Brachial Palpable Palpable  Carotid Palpable, No Bruit Palpable, No Bruit  Aorta Not palpable N/A  Femoral Palpable Palpable  Popliteal Not palpable Not palpable  PT Not palpable Not palpable  DP Not palpable Not palpable    Gastro- intestinal soft, non-distended, non-tender to palpation, No guarding or rebound, no HSM, no masses, no CVAT B, No palpable prominent aortic pulse,    Musculo- skeletal M/S 5/5 throughout  , R great toe with ulcerated distal phalange, clean without any smell or drainage, R 5th toe with gangrenous changes in distal phalange  Neurologic Pain and light touch intact in extremities , Motor exam as listed above    Non-Invasive Vascular Imaging   RLE Arterial Duplex (06/15/17):  Mild diffuse plaque noted throughout the common femoral, profunda femoral, superficial femoral and popliteal with no focal velocity elevations noted.  Flow in the proximal-mid posterior and anterior tibial arteries was unable to be documented.  The peroneal artery was unable to be visuzlied due to body habitus, edema.  No significant change compared to previous study.   Medical Decision Making   TAGAN BARTRAM is a 84 y.o. female who presents with: critical limb ischemia with R 1st toe ulceration and R 5th toe dry gangrene.   Based on the patient's vascular studies and examination, I have offered the patient: aortogram, right leg runoff, and possible intervention. I discussed with the patient the nature of angiographic procedures, especially the limited patencies of any endovascular intervention.   The patient is aware of that the risks of an angiographic procedure include  but are not limited to: bleeding, infection, access site complications, renal failure, embolization, rupture of  vessel, dissection, arteriovenous fistula, possible need for emergent surgical intervention, possible need for surgical procedures to treat the patient's pathology, anaphylactic reaction to contrast, and stroke and death.   The patient is aware of the risks and agrees to proceed. This procedure is scheduled with Dr. Darrick Penna on 13 FEB 19.  I discussed in depth with the patient the nature of atherosclerosis, and emphasized the importance of maximal medical management including strict control of blood pressure, blood glucose, and lipid levels, antiplatelet agents, obtaining regular exercise, and cessation of smoking.    The patient is aware that without maximal medical management the underlying atherosclerotic disease process will progress, limiting the benefit of any interventions. The patient is currently not on on statin as not medically indicated.  The patient is currently on an anti-platelet: ASA.  Thank you for allowing [place] to participate in this patient's care.   Leonides Sake, MD, FACS [facility] [place] Office: [phone] Pager: [phone]

## 2017-06-17 ENCOUNTER — Ambulatory Visit (INDEPENDENT_AMBULATORY_CARE_PROVIDER_SITE_OTHER): Payer: Medicare Other | Admitting: Vascular Surgery

## 2017-06-17 ENCOUNTER — Other Ambulatory Visit: Payer: Self-pay

## 2017-06-17 ENCOUNTER — Encounter: Payer: Self-pay | Admitting: *Deleted

## 2017-06-17 ENCOUNTER — Encounter: Payer: Self-pay | Admitting: Vascular Surgery

## 2017-06-17 ENCOUNTER — Other Ambulatory Visit: Payer: Self-pay | Admitting: *Deleted

## 2017-06-17 VITALS — BP 100/58 | HR 69 | Temp 97.3°F | Resp 16 | Ht 63.0 in | Wt 187.0 lb

## 2017-06-17 DIAGNOSIS — I7025 Atherosclerosis of native arteries of other extremities with ulceration: Secondary | ICD-10-CM | POA: Diagnosis not present

## 2017-06-18 DIAGNOSIS — Z992 Dependence on renal dialysis: Secondary | ICD-10-CM | POA: Diagnosis not present

## 2017-06-18 DIAGNOSIS — N186 End stage renal disease: Secondary | ICD-10-CM | POA: Diagnosis not present

## 2017-06-18 DIAGNOSIS — N2581 Secondary hyperparathyroidism of renal origin: Secondary | ICD-10-CM | POA: Diagnosis not present

## 2017-06-18 DIAGNOSIS — E1129 Type 2 diabetes mellitus with other diabetic kidney complication: Secondary | ICD-10-CM | POA: Diagnosis not present

## 2017-06-18 DIAGNOSIS — D631 Anemia in chronic kidney disease: Secondary | ICD-10-CM | POA: Diagnosis not present

## 2017-06-20 ENCOUNTER — Ambulatory Visit: Payer: Medicare Other | Admitting: Podiatry

## 2017-06-20 DIAGNOSIS — L97516 Non-pressure chronic ulcer of other part of right foot with bone involvement without evidence of necrosis: Secondary | ICD-10-CM | POA: Diagnosis not present

## 2017-06-20 DIAGNOSIS — E1140 Type 2 diabetes mellitus with diabetic neuropathy, unspecified: Secondary | ICD-10-CM | POA: Diagnosis not present

## 2017-06-20 DIAGNOSIS — L97514 Non-pressure chronic ulcer of other part of right foot with necrosis of bone: Secondary | ICD-10-CM | POA: Diagnosis not present

## 2017-06-20 DIAGNOSIS — E1151 Type 2 diabetes mellitus with diabetic peripheral angiopathy without gangrene: Secondary | ICD-10-CM | POA: Diagnosis not present

## 2017-06-20 DIAGNOSIS — M8668 Other chronic osteomyelitis, other site: Secondary | ICD-10-CM | POA: Diagnosis not present

## 2017-06-20 DIAGNOSIS — E1169 Type 2 diabetes mellitus with other specified complication: Secondary | ICD-10-CM | POA: Diagnosis not present

## 2017-06-20 DIAGNOSIS — E11621 Type 2 diabetes mellitus with foot ulcer: Secondary | ICD-10-CM | POA: Diagnosis not present

## 2017-06-20 DIAGNOSIS — M86371 Chronic multifocal osteomyelitis, right ankle and foot: Secondary | ICD-10-CM | POA: Diagnosis not present

## 2017-06-20 DIAGNOSIS — E1142 Type 2 diabetes mellitus with diabetic polyneuropathy: Secondary | ICD-10-CM | POA: Diagnosis not present

## 2017-06-21 DIAGNOSIS — E1129 Type 2 diabetes mellitus with other diabetic kidney complication: Secondary | ICD-10-CM | POA: Diagnosis not present

## 2017-06-21 DIAGNOSIS — N186 End stage renal disease: Secondary | ICD-10-CM | POA: Diagnosis not present

## 2017-06-21 DIAGNOSIS — N2581 Secondary hyperparathyroidism of renal origin: Secondary | ICD-10-CM | POA: Diagnosis not present

## 2017-06-21 DIAGNOSIS — D631 Anemia in chronic kidney disease: Secondary | ICD-10-CM | POA: Diagnosis not present

## 2017-06-21 DIAGNOSIS — Z992 Dependence on renal dialysis: Secondary | ICD-10-CM | POA: Diagnosis not present

## 2017-06-23 DIAGNOSIS — N2581 Secondary hyperparathyroidism of renal origin: Secondary | ICD-10-CM | POA: Diagnosis not present

## 2017-06-23 DIAGNOSIS — Z992 Dependence on renal dialysis: Secondary | ICD-10-CM | POA: Diagnosis not present

## 2017-06-23 DIAGNOSIS — N186 End stage renal disease: Secondary | ICD-10-CM | POA: Diagnosis not present

## 2017-06-23 DIAGNOSIS — D631 Anemia in chronic kidney disease: Secondary | ICD-10-CM | POA: Diagnosis not present

## 2017-06-23 DIAGNOSIS — E1129 Type 2 diabetes mellitus with other diabetic kidney complication: Secondary | ICD-10-CM | POA: Diagnosis not present

## 2017-06-24 ENCOUNTER — Ambulatory Visit (HOSPITAL_COMMUNITY)
Admission: RE | Disposition: A | Discharge status: Home / Self Care | Payer: Self-pay | Source: Ambulatory Visit | Attending: Vascular Surgery

## 2017-06-24 ENCOUNTER — Ambulatory Visit (HOSPITAL_COMMUNITY)
Admission: RE | Admit: 2017-06-24 | Discharge: 2017-06-24 | Disposition: A | Discharge status: Home / Self Care | Payer: Medicare Other | Source: Ambulatory Visit | Attending: Vascular Surgery | Admitting: Vascular Surgery

## 2017-06-24 ENCOUNTER — Telehealth: Payer: Self-pay | Admitting: Vascular Surgery

## 2017-06-24 DIAGNOSIS — Z992 Dependence on renal dialysis: Secondary | ICD-10-CM | POA: Diagnosis not present

## 2017-06-24 DIAGNOSIS — I70235 Atherosclerosis of native arteries of right leg with ulceration of other part of foot: Secondary | ICD-10-CM | POA: Insufficient documentation

## 2017-06-24 DIAGNOSIS — E669 Obesity, unspecified: Secondary | ICD-10-CM | POA: Diagnosis not present

## 2017-06-24 DIAGNOSIS — L97519 Non-pressure chronic ulcer of other part of right foot with unspecified severity: Secondary | ICD-10-CM | POA: Diagnosis not present

## 2017-06-24 DIAGNOSIS — N189 Chronic kidney disease, unspecified: Secondary | ICD-10-CM | POA: Diagnosis not present

## 2017-06-24 DIAGNOSIS — Z87891 Personal history of nicotine dependence: Secondary | ICD-10-CM | POA: Diagnosis not present

## 2017-06-24 DIAGNOSIS — E11621 Type 2 diabetes mellitus with foot ulcer: Secondary | ICD-10-CM | POA: Insufficient documentation

## 2017-06-24 DIAGNOSIS — Z881 Allergy status to other antibiotic agents status: Secondary | ICD-10-CM | POA: Insufficient documentation

## 2017-06-24 DIAGNOSIS — Z8673 Personal history of transient ischemic attack (TIA), and cerebral infarction without residual deficits: Secondary | ICD-10-CM | POA: Diagnosis not present

## 2017-06-24 DIAGNOSIS — Z7982 Long term (current) use of aspirin: Secondary | ICD-10-CM | POA: Insufficient documentation

## 2017-06-24 DIAGNOSIS — E1140 Type 2 diabetes mellitus with diabetic neuropathy, unspecified: Secondary | ICD-10-CM | POA: Insufficient documentation

## 2017-06-24 DIAGNOSIS — Z79899 Other long term (current) drug therapy: Secondary | ICD-10-CM | POA: Diagnosis not present

## 2017-06-24 DIAGNOSIS — E1122 Type 2 diabetes mellitus with diabetic chronic kidney disease: Secondary | ICD-10-CM | POA: Diagnosis not present

## 2017-06-24 DIAGNOSIS — E1151 Type 2 diabetes mellitus with diabetic peripheral angiopathy without gangrene: Secondary | ICD-10-CM | POA: Insufficient documentation

## 2017-06-24 DIAGNOSIS — I739 Peripheral vascular disease, unspecified: Secondary | ICD-10-CM | POA: Diagnosis not present

## 2017-06-24 DIAGNOSIS — Z885 Allergy status to narcotic agent status: Secondary | ICD-10-CM | POA: Insufficient documentation

## 2017-06-24 HISTORY — PX: PERIPHERAL VASCULAR INTERVENTION: CATH118257

## 2017-06-24 HISTORY — PX: ABDOMINAL AORTOGRAM: CATH118222

## 2017-06-24 HISTORY — PX: LOWER EXTREMITY ANGIOGRAPHY: CATH118251

## 2017-06-24 LAB — POCT I-STAT, CHEM 8
BUN: 28 mg/dL — ABNORMAL HIGH (ref 6–20)
CREATININE: 5.6 mg/dL — AB (ref 0.44–1.00)
Calcium, Ion: 1.15 mmol/L (ref 1.15–1.40)
Chloride: 99 mmol/L — ABNORMAL LOW (ref 101–111)
Glucose, Bld: 131 mg/dL — ABNORMAL HIGH (ref 65–99)
HEMATOCRIT: 40 % (ref 36.0–46.0)
HEMOGLOBIN: 13.6 g/dL (ref 12.0–15.0)
Potassium: 4.4 mmol/L (ref 3.5–5.1)
SODIUM: 141 mmol/L (ref 135–145)
TCO2: 32 mmol/L (ref 22–32)

## 2017-06-24 LAB — POCT ACTIVATED CLOTTING TIME
ACTIVATED CLOTTING TIME: 230 s
ACTIVATED CLOTTING TIME: 230 s
ACTIVATED CLOTTING TIME: 197 s
ACTIVATED CLOTTING TIME: 136 s
Activated Clotting Time: 180 seconds
Activated Clotting Time: 235 seconds
Activated Clotting Time: 186 seconds

## 2017-06-24 SURGERY — ABDOMINAL AORTOGRAM
Anesthesia: LOCAL | Laterality: Right

## 2017-06-24 MED ORDER — HEPARIN SODIUM (PORCINE) 1000 UNIT/ML IJ SOLN
INTRAMUSCULAR | Status: AC
  Filled 2017-06-24: qty 1

## 2017-06-24 MED ORDER — SODIUM CHLORIDE 0.9% FLUSH: 3.0000 mL | Freq: Two times a day (BID) | INTRAVENOUS | Status: DC

## 2017-06-24 MED ORDER — CLOPIDOGREL BISULFATE 75 MG PO TABS
75.0000 mg | ORAL_TABLET | Freq: Every day | ORAL | 11 refills | Status: DC
Start: 2017-06-24 — End: 2018-02-24

## 2017-06-24 MED ORDER — SODIUM CHLORIDE 0.9% FLUSH: 3.0000 mL | INTRAVENOUS | Status: DC | PRN

## 2017-06-24 MED ORDER — OXYCODONE HCL 5 MG PO TABS: 5.0000 mg | ORAL_TABLET | ORAL | Status: DC | PRN

## 2017-06-24 MED ORDER — CLOPIDOGREL BISULFATE 75 MG PO TABS
300.0000 mg | ORAL_TABLET | Freq: Once | ORAL | Status: AC
  Administered 2017-06-24: 300 mg via ORAL

## 2017-06-24 MED ORDER — HYDRALAZINE HCL 20 MG/ML IJ SOLN: 5.0000 mg | INTRAMUSCULAR | Status: DC | PRN

## 2017-06-24 MED ORDER — LABETALOL HCL 5 MG/ML IV SOLN: 10.0000 mg | INTRAVENOUS | Status: DC | PRN

## 2017-06-24 MED ORDER — LIDOCAINE HCL (PF) 1 % IJ SOLN
INTRAMUSCULAR | Status: DC | PRN
  Administered 2017-06-24: 18 mL

## 2017-06-24 MED ORDER — PROTAMINE SULFATE 10 MG/ML IV SOLN
INTRAVENOUS | Status: AC
  Filled 2017-06-24: qty 5

## 2017-06-24 MED ORDER — CLOPIDOGREL BISULFATE 300 MG PO TABS
ORAL_TABLET | ORAL | Status: AC
  Filled 2017-06-24: qty 1

## 2017-06-24 MED ORDER — CLOPIDOGREL BISULFATE 75 MG PO TABS: 75.0000 mg | ORAL_TABLET | Freq: Every day | ORAL | Status: DC

## 2017-06-24 MED ORDER — IODIXANOL 320 MG/ML IV SOLN
INTRAVENOUS | Status: DC | PRN
  Administered 2017-06-24: 160 mL via INTRAVENOUS

## 2017-06-24 MED ORDER — SODIUM CHLORIDE 0.9 % IV SOLN: 250.0000 mL | INTRAVENOUS | Status: DC | PRN

## 2017-06-24 MED ORDER — LIDOCAINE HCL 1 % IJ SOLN
INTRAMUSCULAR | Status: AC
  Filled 2017-06-24: qty 20

## 2017-06-24 MED ORDER — PROTAMINE SULFATE 10 MG/ML IV SOLN
20.0000 mg | Freq: Once | INTRAVENOUS | Status: AC
  Administered 2017-06-24: 20 mg via INTRAVENOUS

## 2017-06-24 MED ORDER — HEPARIN SODIUM (PORCINE) 1000 UNIT/ML IJ SOLN
INTRAMUSCULAR | Status: DC | PRN
  Administered 2017-06-24 (×2): 5000 [IU] via INTRAVENOUS
  Administered 2017-06-24: 2000 [IU] via INTRAVENOUS

## 2017-06-24 MED ORDER — HEPARIN (PORCINE) IN NACL 2-0.9 UNIT/ML-% IJ SOLN
INTRAMUSCULAR | Status: AC
  Filled 2017-06-24: qty 1000

## 2017-06-24 MED ORDER — MORPHINE SULFATE (PF) 10 MG/ML IV SOLN: 2.0000 mg | INTRAVENOUS | Status: DC | PRN

## 2017-06-24 MED ORDER — PROTAMINE SULFATE 10 MG/ML IV SOLN: 25.0000 mg | Freq: Once | INTRAVENOUS | Status: DC

## 2017-06-24 MED ORDER — HEPARIN (PORCINE) IN NACL 2-0.9 UNIT/ML-% IJ SOLN
INTRAMUSCULAR | Status: AC | PRN
  Administered 2017-06-24: 1000 mL

## 2017-06-24 SURGICAL SUPPLY — 23 items
BALLN COYOTE OTW 3X60X150 (BALLOONS) ×4
BALLN MUSTANG 6X60X135 (BALLOONS) ×4
BALLOON COYOTE OTW 3X60X150 (BALLOONS) IMPLANT
BALLOON MUSTANG 6X60X135 (BALLOONS) IMPLANT
CATH ANGIO 5F BER2 65CM (CATHETERS) ×1 IMPLANT
CATH ANGIO 5F PIGTAIL 65CM (CATHETERS) ×1 IMPLANT
CATH QUICKCROSS .035X135CM (MICROCATHETER) ×1 IMPLANT
CATH SOS OMNI O 5F 80CM (CATHETERS) ×1 IMPLANT
DEVICE CONTINUOUS FLUSH (MISCELLANEOUS) ×1 IMPLANT
DEVICE TORQUE .025-.038 (MISCELLANEOUS) ×1 IMPLANT
GUIDEWIRE ANGLED .035X260CM (WIRE) ×1 IMPLANT
KIT ENCORE 26 ADVANTAGE (KITS) ×1 IMPLANT
KIT PV (KITS) ×4 IMPLANT
SHEATH FLEX ANSEL ST 6FR 45CM (SHEATH) ×1 IMPLANT
SHEATH PINNACLE 5F 10CM (SHEATH) ×1 IMPLANT
SHEATH PINNACLE 7F 10CM (SHEATH) ×1 IMPLANT
STENT INNOVA 6X60X130 (Permanent Stent) ×1 IMPLANT
SYR MEDRAD MARK V 150ML (SYRINGE) ×4 IMPLANT
TRANSDUCER W/STOPCOCK (MISCELLANEOUS) ×4 IMPLANT
TRAY PV CATH (CUSTOM PROCEDURE TRAY) ×4 IMPLANT
WIRE AMPLATZ SS-J .035X260CM (WIRE) ×1 IMPLANT
WIRE BENTSON .035X145CM (WIRE) ×1 IMPLANT
WIRE SPARTACORE .014X300CM (WIRE) ×1 IMPLANT

## 2017-06-24 NOTE — Progress Notes (Signed)
796fr sheath aspirated and removed from LFA. Manual pressure applied for 20 minutes. Groin level 0 no S+S of hematoma. Tegaderm dressing applied, bedrest instructions given.   Bilateral dp pulses present with doppler. Pt pulses also present with doppler but weak on right side.  Bedrest begins at 12:45:00

## 2017-06-24 NOTE — Discharge Instructions (Signed)

## 2017-06-24 NOTE — Telephone Encounter (Signed)
Sched appt 07/20/17 at 3:00. Spoke to pt to inform them of appt.

## 2017-06-24 NOTE — Op Note (Addendum)
Procedure: Abdominal aortogram with bilateral lower extremity runoff, angioplasty right popliteal artery, right superficial femoral artery stent (6 x 60 Innova)  Preoperative diagnosis: Nonhealing wound right foot  Postoperative diagnosis: Same  Anesthesia: Local  Operative findings: 1.  70% stenosis right popliteal artery angioplastied to 0 residual stenosis  2.  80% right superficial femoral artery stenosis primarily stented to less than 10% stenosis  3.  Severe diffuse arterial calcification  Operative details: After obtaining informed consent, the patient was taken the PV lab.  The patient was placed in supine position Angio table.  Both groins were prepped and draped in usual sterile fashion.  Local anesthesia was infiltrated over the left common femoral artery.  Ultrasound was used to identify the left common femoral artery and femoral bifurcation.  There was severe calcification apparent on ultrasound.  An introducer needle was used to cannulate the left common femoral artery and an 035 Bentson wire threaded up in the abdominal aorta under fluoroscopic guidance.  A 5 French sheath was then placed over the guidewire into the left common femoral artery.  This was thoroughly flushed with heparin I saline.  A 5 French pigtail catheter was advanced over the guidewire into the abdominal aorta and an abdominal aortogram was obtained in AP projection.  The infrarenal abdominal aorta is widely patent.  The left and right common and external iliac arteries are widely patent.  There is a 90% narrowing of the right internal iliac artery.  The left internal iliac artery is patent.  At this point the catheter was pulled down just above the aortic bifurcation and bilateral lower extremity runoff views were obtained through the pigtail catheter.  In the left lower extremity, the left common femoral profunda femoris and superficial femoral arteries are all widely patent.  Left popliteal artery is patent.   There is one vessel runoff to the peroneal artery in the left leg.  This all is in line flow.  In the right lower extremity, the right common femoral profunda femoris is widely patent although heavily calcified.  The right superficial femoral artery is heavily calcified and has a stenosis adjacent to the adductor hiatus which is 80% and heavily calcified.  The right popliteal artery is patent but has a 70% stenosis just above the takeoff of the tibial vessels.  At this point it was decided to intervene on the popliteal and superficial femoral artery lesions in the right leg.  The patient was given a total of 12,000 units of intravenous heparin.  This was to establish an ACT greater than 250.  At this point the 5 French sheath was exchanged over the Bentson wire for a 6 Engineer, civil (consulting) sheath.  Initially the tip of the dilator would not pass through the common femoral artery due to heavy calcification.  Therefore a dilator from a 7 [place] sheath was used to advance over the wire and through the common femoral artery and then I was able to advance the sheath with its dilator into the proximal left common iliac artery.  A 5 [place] NEFF catheter was used in attempt to selectively catheterize the right common iliac.  However due to the patient's small aorta size the catheter could not be formed up appropriately.  This was exchanged for a 5 [place] SOS catheter.  I was able to form this up and selectively catheterize the right common iliac artery.  I then advanced an 035 Amplatz wire into the distal right external iliac artery.  I was then able  to advance the 6 French sheath and dilator up and over the aortic bifurcation and down to the right common femoral artery.  I then used an 035 Berenstein 2 catheter to direct an 035 angled Glidewire into the right superficial femoral artery.  I was then able to advance the Glidewire past the SFA and popliteal stenoses all the way into the mid peroneal artery.  An 035 quick  cross catheter was advanced over the guidewire to the mid peroneal.  The Glidewire was then removed and exchanged for an 0 018 [place] core wire.  The patient had already been given heparin and established a good ACT.  We brought up a 3 x 60 angioplasty balloon and I angioplastied the below-knee popliteal artery to nominal pressure for 1 minute.  Completion angiogram showed no evidence of dissection and complete resolution of the stenosis.  At this point an angiogram was performed of the SFA lesion.  A 6 x 60 self-expanding Inova stent was selected and centered on the lesion and deployed.  I initially tried to post dilate this by advancing a 6 x 60 angioplasty balloon over the [place] core wire.  However due to severe calcification and the floppiness of the wire the balloon would not advance all the way through the stent.  I initially inflated the balloon to 5 atm for 30 seconds.  It still would not pass.  Therefore the 035 quick cross was advanced back over the [place] core wire [place] core wire was then exchanged for an 035 Amplatz wire.  I was then able to successfully push the balloon across the full length of the stent.  This was then inflated to 24 atm for 1 minute to expand the stent as much as possible.  There was still a residual 10% stenosis secondary to the calcified lesion.  I felt there was adequate flow through there.  There was no evidence of dissection.  This point the guidewires and catheters were removed.  The 6 French sheath was pulled back in the left hemipelvis over the guidewire.  This was left in place to be pulled in the holding area.  The patient tolerated the procedure well and there were no complications.  The patient was taken to the holding area in stable condition.  Operative management: The patient has had successful angioplasty of the below-knee popliteal artery as well as primary stenting of her right superficial femoral artery.  This should be adequate flow for wound healing she has  one vessel runoff via the peroneal artery.  She will follow-up with Dr. Bridgett [name] in 1 month to reassess her foot to see if she needs any further interventions.  She will be started on Plavix today in addition to her aspirin.  She needs to remain on the Plavix for at least 6 weeks if not indefinitely.  If she had any bleeding complications we could consider stopping the Plavix at some point in the future.  Jamie Hinds, MD [facility] [place] Office: [phone] Pager: [phone]

## 2017-06-24 NOTE — Progress Notes (Signed)
Site area: LFA Site Prior to Removal:  Level 0 Pressure Applied For: Manual:   yes Patient Status During Pull:   Post Pull Site:  Level Post Pull Instructions Given:  yes Post Pull Pulses Present: doppler Dressing Applied:  tegaderm Bedrest begins @  Comments:   

## 2017-06-24 NOTE — Interval H&P Note (Signed)
History and Physical Interval Note:  06/24/2017 7:41 AM  [name]  has presented today for surgery, with the diagnosis of PAD  The various methods of treatment have been discussed with the patient and family. After consideration of risks, benefits and other options for treatment, the patient has consented to  Procedure(s): ABDOMINAL AORTOGRAM W/LOWER EXTREMITY (Right) as a surgical intervention .  The patient's history has been reviewed, patient examined, no change in status, stable for surgery.  I have reviewed the patient's chart and labs.  Questions were answered to the patient's satisfaction.     Fabienne Brunsharles Fields

## 2017-06-24 NOTE — Telephone Encounter (Signed)
-----   Message from Sharee PimpleMarilyn K McChesney, RN sent at 06/24/2017  2:10 PM EST ----- Regarding: 1 month with Dr Imogene Burnhen   ----- Message ----- From: Sherren KernsFields, Charles E, MD Sent: 06/24/2017   9:28 AM To: [facility] Charge Pool  Procedure: Abdominal aortogram with bilateral lower extremity runoff, angioplasty right popliteal artery, right superficial femoral artery stent (6 x 60 Innova)  She needs follow-up with Dr. Imogene Burnhen in 1 month to reassess her foot to see if she needs any further interventions.  Fabienne Brunsharles Fields, MD [facility] [place] Office: [phone] Pager: [phone]

## 2017-06-25 DIAGNOSIS — Z992 Dependence on renal dialysis: Secondary | ICD-10-CM | POA: Diagnosis not present

## 2017-06-25 DIAGNOSIS — N186 End stage renal disease: Secondary | ICD-10-CM | POA: Diagnosis not present

## 2017-06-25 DIAGNOSIS — E1129 Type 2 diabetes mellitus with other diabetic kidney complication: Secondary | ICD-10-CM | POA: Diagnosis not present

## 2017-06-25 DIAGNOSIS — D631 Anemia in chronic kidney disease: Secondary | ICD-10-CM | POA: Diagnosis not present

## 2017-06-25 DIAGNOSIS — N2581 Secondary hyperparathyroidism of renal origin: Secondary | ICD-10-CM | POA: Diagnosis not present

## 2017-06-27 ENCOUNTER — Other Ambulatory Visit: Payer: Self-pay | Admitting: Internal Medicine

## 2017-06-27 ENCOUNTER — Encounter (HOSPITAL_COMMUNITY): Payer: Self-pay | Admitting: Vascular Surgery

## 2017-06-27 ENCOUNTER — Ambulatory Visit (HOSPITAL_COMMUNITY)
Admission: RE | Admit: 2017-06-27 | Discharge: 2017-06-27 | Disposition: A | Discharge status: Home / Self Care | Payer: Medicare Other | Source: Ambulatory Visit | Attending: Internal Medicine | Admitting: Internal Medicine

## 2017-06-27 ENCOUNTER — Telehealth: Payer: Self-pay | Admitting: Podiatry

## 2017-06-27 DIAGNOSIS — L97514 Non-pressure chronic ulcer of other part of right foot with necrosis of bone: Secondary | ICD-10-CM | POA: Diagnosis not present

## 2017-06-27 DIAGNOSIS — E1151 Type 2 diabetes mellitus with diabetic peripheral angiopathy without gangrene: Secondary | ICD-10-CM | POA: Diagnosis not present

## 2017-06-27 DIAGNOSIS — L97516 Non-pressure chronic ulcer of other part of right foot with bone involvement without evidence of necrosis: Secondary | ICD-10-CM | POA: Diagnosis not present

## 2017-06-27 DIAGNOSIS — E1142 Type 2 diabetes mellitus with diabetic polyneuropathy: Secondary | ICD-10-CM | POA: Diagnosis not present

## 2017-06-27 DIAGNOSIS — S91101A Unspecified open wound of right great toe without damage to nail, initial encounter: Secondary | ICD-10-CM | POA: Diagnosis not present

## 2017-06-27 DIAGNOSIS — T148XXA Other injury of unspecified body region, initial encounter: Secondary | ICD-10-CM

## 2017-06-27 DIAGNOSIS — X58XXXA Exposure to other specified factors, initial encounter: Secondary | ICD-10-CM | POA: Insufficient documentation

## 2017-06-27 DIAGNOSIS — E11621 Type 2 diabetes mellitus with foot ulcer: Secondary | ICD-10-CM | POA: Diagnosis not present

## 2017-06-27 DIAGNOSIS — M86371 Chronic multifocal osteomyelitis, right ankle and foot: Secondary | ICD-10-CM | POA: Diagnosis not present

## 2017-06-27 DIAGNOSIS — E1169 Type 2 diabetes mellitus with other specified complication: Secondary | ICD-10-CM | POA: Diagnosis not present

## 2017-06-27 DIAGNOSIS — R918 Other nonspecific abnormal finding of lung field: Secondary | ICD-10-CM | POA: Diagnosis not present

## 2017-06-27 MED FILL — Heparin Sodium (Porcine) 2 Unit/ML in Sodium Chloride 0.9%: INTRAMUSCULAR | Qty: 1000 | Status: AC

## 2017-06-27 MED FILL — Lidocaine HCl Local Inj 1%: INTRAMUSCULAR | Qty: 20 | Status: AC

## 2017-06-27 NOTE — Telephone Encounter (Signed)
Will do 6 weeks. I did an addendum

## 2017-06-27 NOTE — Telephone Encounter (Signed)
This is Victorino DikeJennifer at [facility]. I need documentation. On 08 January Dr. Ardelle AntonWagoner put Ms. Eisner on doxycycline but he didn't say how long he put her doxycycline for. I'm trying to put together a hyperbaric approval packet for Ms. Uemura. If someone could fax over either the prescription or documentation of how long or anything else that shows how long she is on the doxycycline for on 08 January appointment. The fax number is [phone]. Thank you and have a great day. Bye bye.

## 2017-06-28 DIAGNOSIS — N2581 Secondary hyperparathyroidism of renal origin: Secondary | ICD-10-CM | POA: Diagnosis not present

## 2017-06-28 DIAGNOSIS — N186 End stage renal disease: Secondary | ICD-10-CM | POA: Diagnosis not present

## 2017-06-28 DIAGNOSIS — E1129 Type 2 diabetes mellitus with other diabetic kidney complication: Secondary | ICD-10-CM | POA: Diagnosis not present

## 2017-06-28 DIAGNOSIS — Z992 Dependence on renal dialysis: Secondary | ICD-10-CM | POA: Diagnosis not present

## 2017-06-28 DIAGNOSIS — D631 Anemia in chronic kidney disease: Secondary | ICD-10-CM | POA: Diagnosis not present

## 2017-06-28 NOTE — Telephone Encounter (Signed)
Faxed clinicals of 05/17/2017 to Victorino DikeJennifer [facility].

## 2017-06-30 DIAGNOSIS — N186 End stage renal disease: Secondary | ICD-10-CM | POA: Diagnosis not present

## 2017-06-30 DIAGNOSIS — Z992 Dependence on renal dialysis: Secondary | ICD-10-CM | POA: Diagnosis not present

## 2017-06-30 DIAGNOSIS — E1129 Type 2 diabetes mellitus with other diabetic kidney complication: Secondary | ICD-10-CM | POA: Diagnosis not present

## 2017-06-30 DIAGNOSIS — D631 Anemia in chronic kidney disease: Secondary | ICD-10-CM | POA: Diagnosis not present

## 2017-06-30 DIAGNOSIS — N2581 Secondary hyperparathyroidism of renal origin: Secondary | ICD-10-CM | POA: Diagnosis not present

## 2017-07-02 DIAGNOSIS — Z992 Dependence on renal dialysis: Secondary | ICD-10-CM | POA: Diagnosis not present

## 2017-07-02 DIAGNOSIS — N2581 Secondary hyperparathyroidism of renal origin: Secondary | ICD-10-CM | POA: Diagnosis not present

## 2017-07-02 DIAGNOSIS — D631 Anemia in chronic kidney disease: Secondary | ICD-10-CM | POA: Diagnosis not present

## 2017-07-02 DIAGNOSIS — N186 End stage renal disease: Secondary | ICD-10-CM | POA: Diagnosis not present

## 2017-07-02 DIAGNOSIS — E1129 Type 2 diabetes mellitus with other diabetic kidney complication: Secondary | ICD-10-CM | POA: Diagnosis not present

## 2017-07-04 DIAGNOSIS — E1142 Type 2 diabetes mellitus with diabetic polyneuropathy: Secondary | ICD-10-CM | POA: Diagnosis not present

## 2017-07-04 DIAGNOSIS — T8131XA Disruption of external operation (surgical) wound, not elsewhere classified, initial encounter: Secondary | ICD-10-CM | POA: Diagnosis not present

## 2017-07-04 DIAGNOSIS — M8668 Other chronic osteomyelitis, other site: Secondary | ICD-10-CM | POA: Diagnosis not present

## 2017-07-04 DIAGNOSIS — E1151 Type 2 diabetes mellitus with diabetic peripheral angiopathy without gangrene: Secondary | ICD-10-CM | POA: Diagnosis not present

## 2017-07-04 DIAGNOSIS — M86371 Chronic multifocal osteomyelitis, right ankle and foot: Secondary | ICD-10-CM | POA: Diagnosis not present

## 2017-07-04 DIAGNOSIS — L97514 Non-pressure chronic ulcer of other part of right foot with necrosis of bone: Secondary | ICD-10-CM | POA: Diagnosis not present

## 2017-07-04 DIAGNOSIS — E1169 Type 2 diabetes mellitus with other specified complication: Secondary | ICD-10-CM | POA: Diagnosis not present

## 2017-07-04 DIAGNOSIS — E11621 Type 2 diabetes mellitus with foot ulcer: Secondary | ICD-10-CM | POA: Diagnosis not present

## 2017-07-05 DIAGNOSIS — E1129 Type 2 diabetes mellitus with other diabetic kidney complication: Secondary | ICD-10-CM | POA: Diagnosis not present

## 2017-07-05 DIAGNOSIS — N2581 Secondary hyperparathyroidism of renal origin: Secondary | ICD-10-CM | POA: Diagnosis not present

## 2017-07-05 DIAGNOSIS — Z992 Dependence on renal dialysis: Secondary | ICD-10-CM | POA: Diagnosis not present

## 2017-07-05 DIAGNOSIS — N186 End stage renal disease: Secondary | ICD-10-CM | POA: Diagnosis not present

## 2017-07-05 DIAGNOSIS — D631 Anemia in chronic kidney disease: Secondary | ICD-10-CM | POA: Diagnosis not present

## 2017-07-07 DIAGNOSIS — E1129 Type 2 diabetes mellitus with other diabetic kidney complication: Secondary | ICD-10-CM | POA: Diagnosis not present

## 2017-07-07 DIAGNOSIS — N186 End stage renal disease: Secondary | ICD-10-CM | POA: Diagnosis not present

## 2017-07-07 DIAGNOSIS — D631 Anemia in chronic kidney disease: Secondary | ICD-10-CM | POA: Diagnosis not present

## 2017-07-07 DIAGNOSIS — N2581 Secondary hyperparathyroidism of renal origin: Secondary | ICD-10-CM | POA: Diagnosis not present

## 2017-07-07 DIAGNOSIS — Z992 Dependence on renal dialysis: Secondary | ICD-10-CM | POA: Diagnosis not present

## 2017-07-08 DIAGNOSIS — N186 End stage renal disease: Secondary | ICD-10-CM | POA: Diagnosis not present

## 2017-07-08 DIAGNOSIS — Z992 Dependence on renal dialysis: Secondary | ICD-10-CM | POA: Diagnosis not present

## 2017-07-08 DIAGNOSIS — E1129 Type 2 diabetes mellitus with other diabetic kidney complication: Secondary | ICD-10-CM | POA: Diagnosis not present

## 2017-07-09 DIAGNOSIS — D509 Iron deficiency anemia, unspecified: Secondary | ICD-10-CM | POA: Diagnosis not present

## 2017-07-09 DIAGNOSIS — D631 Anemia in chronic kidney disease: Secondary | ICD-10-CM | POA: Diagnosis not present

## 2017-07-09 DIAGNOSIS — E1129 Type 2 diabetes mellitus with other diabetic kidney complication: Secondary | ICD-10-CM | POA: Diagnosis not present

## 2017-07-09 DIAGNOSIS — N2581 Secondary hyperparathyroidism of renal origin: Secondary | ICD-10-CM | POA: Diagnosis not present

## 2017-07-09 DIAGNOSIS — N186 End stage renal disease: Secondary | ICD-10-CM | POA: Diagnosis not present

## 2017-07-09 DIAGNOSIS — Z992 Dependence on renal dialysis: Secondary | ICD-10-CM | POA: Diagnosis not present

## 2017-07-11 ENCOUNTER — Encounter: Payer: Self-pay | Admitting: Behavioral Health

## 2017-07-11 ENCOUNTER — Ambulatory Visit (INDEPENDENT_AMBULATORY_CARE_PROVIDER_SITE_OTHER): Payer: Medicare Other | Admitting: Internal Medicine

## 2017-07-11 ENCOUNTER — Other Ambulatory Visit: Payer: Self-pay | Admitting: Internal Medicine

## 2017-07-11 ENCOUNTER — Encounter (HOSPITAL_BASED_OUTPATIENT_CLINIC_OR_DEPARTMENT_OTHER): Payer: Medicare Other | Attending: Internal Medicine

## 2017-07-11 ENCOUNTER — Other Ambulatory Visit (HOSPITAL_COMMUNITY)
Admit: 2017-07-11 | Discharge: 2017-07-11 | Disposition: A | Discharge status: Home / Self Care | Payer: Medicare Other | Source: Ambulatory Visit | Attending: Internal Medicine | Admitting: Internal Medicine

## 2017-07-11 DIAGNOSIS — E1122 Type 2 diabetes mellitus with diabetic chronic kidney disease: Secondary | ICD-10-CM | POA: Diagnosis not present

## 2017-07-11 DIAGNOSIS — E1151 Type 2 diabetes mellitus with diabetic peripheral angiopathy without gangrene: Secondary | ICD-10-CM | POA: Diagnosis not present

## 2017-07-11 DIAGNOSIS — N186 End stage renal disease: Secondary | ICD-10-CM | POA: Insufficient documentation

## 2017-07-11 DIAGNOSIS — I7025 Atherosclerosis of native arteries of other extremities with ulceration: Secondary | ICD-10-CM

## 2017-07-11 DIAGNOSIS — E11621 Type 2 diabetes mellitus with foot ulcer: Secondary | ICD-10-CM | POA: Insufficient documentation

## 2017-07-11 DIAGNOSIS — M86671 Other chronic osteomyelitis, right ankle and foot: Secondary | ICD-10-CM | POA: Diagnosis not present

## 2017-07-11 DIAGNOSIS — E1142 Type 2 diabetes mellitus with diabetic polyneuropathy: Secondary | ICD-10-CM | POA: Diagnosis not present

## 2017-07-11 DIAGNOSIS — M86371 Chronic multifocal osteomyelitis, right ankle and foot: Secondary | ICD-10-CM | POA: Diagnosis not present

## 2017-07-11 DIAGNOSIS — Z87891 Personal history of nicotine dependence: Secondary | ICD-10-CM | POA: Diagnosis not present

## 2017-07-11 DIAGNOSIS — Z992 Dependence on renal dialysis: Secondary | ICD-10-CM | POA: Insufficient documentation

## 2017-07-11 DIAGNOSIS — E1169 Type 2 diabetes mellitus with other specified complication: Secondary | ICD-10-CM | POA: Insufficient documentation

## 2017-07-11 DIAGNOSIS — L97514 Non-pressure chronic ulcer of other part of right foot with necrosis of bone: Secondary | ICD-10-CM | POA: Insufficient documentation

## 2017-07-11 DIAGNOSIS — M86171 Other acute osteomyelitis, right ankle and foot: Secondary | ICD-10-CM | POA: Diagnosis not present

## 2017-07-11 DIAGNOSIS — E11649 Type 2 diabetes mellitus with hypoglycemia without coma: Secondary | ICD-10-CM | POA: Insufficient documentation

## 2017-07-11 NOTE — Progress Notes (Addendum)
[facility]  Reason for Consult: Right great toe osteomyelitis Referring Physician: Dr. Mila PalmerSharon [name]  Assessment: Ms. Luiz BlareGraves probably has chronic osteomyelitis of the distal right great toe.  More than likely this is polymicrobial in nature with a mixture of aerobic and anaerobic bacteria.  I will review the pathology report, Gram stain and cultures then make a recommendation about antibiotic therapy within the next week.  She will follow-up here in 4 weeks.  Addendum: Pathologic examination of biopsied bone shows acute osteomyelitis.  No organisms were seen on Gram stain and the only organ Candida which is probably an insignificant skin contaminant.  I will treat her with empiric amoxicillin and metronidazole.  She will follow-up with me on 08/10/2017.  Plan: 1. Await results of pathology review, Gram stain and culture  Patient Active Problem List   Diagnosis Date Noted  . Osteomyelitis, chronic, ankle or foot, right (HCC) 07/11/2017    Priority: High  . PAD (peripheral artery disease) (HCC) 04/29/2015  . Atherosclerosis of native arteries of the extremities with ulceration (HCC) 01/10/2014  . CVA (cerebral infarction) 12/14/2013  . Dysphagia, unspecified(787.20) 11/28/2013  . Diabetes mellitus (HCC) 10/31/2012  . Depression 10/31/2012  . Anemia 10/31/2012  . History of Clostridium difficile colitis 10/31/2012  . Chronic diarrhea 10/31/2012  . ESRD on dialysis [facility](HCC) 10/06/2012    Patient's Medications  New Prescriptions   No medications on file  Previous Medications   ACETAMINOPHEN (TYLENOL) 500 MG TABLET    Take 1,000 mg by mouth every 6 (six) hours as needed for mild pain or headache.    ASPIRIN 81 MG TABLET    Take 81 mg by mouth daily.    CEPHALEXIN (KEFLEX) 500 MG CAPSULE    Take 1 capsule (500 mg total) by mouth 2 (two) times daily.   CLOPIDOGREL (PLAVIX) 75 MG TABLET    Take 1 tablet (75 mg total) by mouth daily.   DOXERCALCIFEROL  (HECTOROL) 2 MCG/ML SOLN    Inject 3 mcg into the vein 3 (three) times a week.   GABAPENTIN (NEURONTIN) 100 MG CAPSULE    Take 100 mg by mouth at bedtime.    IRON SUCROSE IV    Inject 1 Dose into the vein once a week.   METHOXY PEG-EPOETIN BETA (MIRCERA) 75 MCG/0.3ML SOSY    Inject 75 mcg as directed every 14 (fourteen) days.   MUPIROCIN OINTMENT (BACTROBAN) 2 %    Apply 1 application topically 2 (two) times daily.   OXYCODONE-ACETAMINOPHEN (PERCOCET/ROXICET) 5-325 MG TABLET    Take 1 tablet by mouth every 6 (six) hours as needed.   RENVELA 800 MG TABLET    Take 1,600-2,400 mg by mouth See admin instructions. Take 2400 mg by mouth 3 times daily with meals and take 1600 mg by mouth with snacks   SENSIPAR 30 MG TABLET    Take 30 mg by mouth every evening.    SILVER SULFADIAZINE (SILVADENE) 1 % CREAM    Apply 1 application topically daily.  Modified Medications   No medications on file  Discontinued Medications   No medications on file    HPI: Jamie LippsBetty G [name] is a 84 y.o. female with diabetes, end-stage renal disease on hemodialysis and peripheral artery disease.  Several months ago she developed ulceration over the distal tip of her right great toe.  She has been seen by podiatry, vascular surgery and at the wound clinic.  She started on doxycycline in  January but developed diarrhea.  Of note she has a history of C. difficile colitis in 2014.  She was switched to oral cephalexin and her diarrhea resolved.  At some point she developed exposed bone in the bed of the ulcer.  A wound culture on 06/14/2017 showed only normal skin flora.  She underwent an aortogram on 06/24/2017 and had stenting of her right femoral artery stenosis.  A plain x-ray on 06/27/2017 showed cortical irregularity of the tuft of her right great toe.  When she saw Dr. [name] at the wound center on 07/04/2017 her wound had not improved and she still had exposed bone.  He had her stop taking cephalexin at that time.  He saw her again  this morning and obtained a bone biopsy for pathology review, Gram stain and culture.  Review of Systems: Review of Systems  Constitutional: Negative for chills, diaphoresis and fever.  Gastrointestinal: Negative for abdominal pain, diarrhea, nausea and vomiting.  Musculoskeletal: Positive for joint pain.    . protamine  25 mg Intravenous Once    Past Medical History:  Diagnosis Date  . Anemia   . C. difficile diarrhea   . Chronic kidney disease    on Dialysis  . Depression   . Diabetes mellitus   . Diabetic neuropathy (HCC)   . GERD (gastroesophageal reflux disease)   . Gout   . Hyperparathyroidism   . MRSA (methicillin resistant staph aureus) culture positive   . Obesity   . Osteoarthritis   . Peripheral arterial disease (HCC)    Gangrene-Left Great Toe  . Shortness of breath dyspnea    with exertion  . Stroke [facility])     Social History   Tobacco Use  . Smoking status: Former Smoker    Types: Cigarettes  . Smokeless tobacco: Never Used  Substance Use Topics  . Alcohol use: No  . Drug use: No    Family History  Problem Relation Age of Onset  . Cancer Father        type unknown  . Stroke Mother   . Diabetes Sister   . Cancer Brother        type unknown   Allergies  Allergen Reactions  . Codeine Hives  . Vancomycin Rash    OBJECTIVE: Vitals:   07/11/17 1428  BP: (!) 154/80  Pulse: 84  Temp: (!) 97.5 F (36.4 C)  TempSrc: Oral  Weight: 193 lb (87.5 kg)  Height: 5\' 3"  (1.6 m)   Body mass index is 34.19 kg/m.   Physical Exam  Constitutional: She is oriented to person, place, and time.  She is pleasant and in no distress.  Pulmonary/Chest:  She has a right subclavian hemodialysis catheter.  Neurological: She is alert and oriented to person, place, and time.  She just had a new dressing placed at the wound center and asked that it not be taken off so I was not able to examine the wound but I was able to review Dr. Jannetta Quint note from this  morning's visit.  Psychiatric: Mood and affect normal.    Microbiology: No results found for this or any previous visit (from the past 240 hour(s)).  Cliffton Asters, MD [facility] [phone] pager   [phone] cell 07/11/2017, 3:00 PM

## 2017-07-13 DIAGNOSIS — Z992 Dependence on renal dialysis: Secondary | ICD-10-CM | POA: Diagnosis not present

## 2017-07-13 DIAGNOSIS — D631 Anemia in chronic kidney disease: Secondary | ICD-10-CM | POA: Diagnosis not present

## 2017-07-13 DIAGNOSIS — D509 Iron deficiency anemia, unspecified: Secondary | ICD-10-CM | POA: Diagnosis not present

## 2017-07-13 DIAGNOSIS — E1129 Type 2 diabetes mellitus with other diabetic kidney complication: Secondary | ICD-10-CM | POA: Diagnosis not present

## 2017-07-13 DIAGNOSIS — N2581 Secondary hyperparathyroidism of renal origin: Secondary | ICD-10-CM | POA: Diagnosis not present

## 2017-07-13 DIAGNOSIS — N186 End stage renal disease: Secondary | ICD-10-CM | POA: Diagnosis not present

## 2017-07-14 DIAGNOSIS — D509 Iron deficiency anemia, unspecified: Secondary | ICD-10-CM | POA: Diagnosis not present

## 2017-07-14 DIAGNOSIS — E1129 Type 2 diabetes mellitus with other diabetic kidney complication: Secondary | ICD-10-CM | POA: Diagnosis not present

## 2017-07-14 DIAGNOSIS — N2581 Secondary hyperparathyroidism of renal origin: Secondary | ICD-10-CM | POA: Diagnosis not present

## 2017-07-14 DIAGNOSIS — N186 End stage renal disease: Secondary | ICD-10-CM | POA: Diagnosis not present

## 2017-07-14 DIAGNOSIS — D631 Anemia in chronic kidney disease: Secondary | ICD-10-CM | POA: Diagnosis not present

## 2017-07-14 DIAGNOSIS — Z992 Dependence on renal dialysis: Secondary | ICD-10-CM | POA: Diagnosis not present

## 2017-07-14 LAB — AEROBIC CULTURE  (SUPERFICIAL SPECIMEN)

## 2017-07-14 LAB — AEROBIC CULTURE W GRAM STAIN (SUPERFICIAL SPECIMEN): Gram Stain: NONE SEEN

## 2017-07-15 DIAGNOSIS — E1142 Type 2 diabetes mellitus with diabetic polyneuropathy: Secondary | ICD-10-CM | POA: Diagnosis not present

## 2017-07-15 DIAGNOSIS — E11621 Type 2 diabetes mellitus with foot ulcer: Secondary | ICD-10-CM | POA: Diagnosis not present

## 2017-07-15 DIAGNOSIS — L97514 Non-pressure chronic ulcer of other part of right foot with necrosis of bone: Secondary | ICD-10-CM | POA: Diagnosis not present

## 2017-07-15 DIAGNOSIS — M86371 Chronic multifocal osteomyelitis, right ankle and foot: Secondary | ICD-10-CM | POA: Diagnosis not present

## 2017-07-15 DIAGNOSIS — E1151 Type 2 diabetes mellitus with diabetic peripheral angiopathy without gangrene: Secondary | ICD-10-CM | POA: Diagnosis not present

## 2017-07-15 DIAGNOSIS — M8668 Other chronic osteomyelitis, other site: Secondary | ICD-10-CM | POA: Diagnosis not present

## 2017-07-15 DIAGNOSIS — E1169 Type 2 diabetes mellitus with other specified complication: Secondary | ICD-10-CM | POA: Diagnosis not present

## 2017-07-15 LAB — GLUCOSE, CAPILLARY: GLUCOSE-CAPILLARY: 98 mg/dL (ref 65–99)

## 2017-07-16 DIAGNOSIS — D631 Anemia in chronic kidney disease: Secondary | ICD-10-CM | POA: Diagnosis not present

## 2017-07-16 DIAGNOSIS — N2581 Secondary hyperparathyroidism of renal origin: Secondary | ICD-10-CM | POA: Diagnosis not present

## 2017-07-16 DIAGNOSIS — E1129 Type 2 diabetes mellitus with other diabetic kidney complication: Secondary | ICD-10-CM | POA: Diagnosis not present

## 2017-07-16 DIAGNOSIS — N186 End stage renal disease: Secondary | ICD-10-CM | POA: Diagnosis not present

## 2017-07-16 DIAGNOSIS — D509 Iron deficiency anemia, unspecified: Secondary | ICD-10-CM | POA: Diagnosis not present

## 2017-07-16 DIAGNOSIS — Z992 Dependence on renal dialysis: Secondary | ICD-10-CM | POA: Diagnosis not present

## 2017-07-18 DIAGNOSIS — E11621 Type 2 diabetes mellitus with foot ulcer: Secondary | ICD-10-CM | POA: Diagnosis not present

## 2017-07-18 DIAGNOSIS — M8668 Other chronic osteomyelitis, other site: Secondary | ICD-10-CM | POA: Diagnosis not present

## 2017-07-18 DIAGNOSIS — L97514 Non-pressure chronic ulcer of other part of right foot with necrosis of bone: Secondary | ICD-10-CM | POA: Diagnosis not present

## 2017-07-18 DIAGNOSIS — E1151 Type 2 diabetes mellitus with diabetic peripheral angiopathy without gangrene: Secondary | ICD-10-CM | POA: Diagnosis not present

## 2017-07-18 DIAGNOSIS — E1142 Type 2 diabetes mellitus with diabetic polyneuropathy: Secondary | ICD-10-CM | POA: Diagnosis not present

## 2017-07-18 DIAGNOSIS — E1169 Type 2 diabetes mellitus with other specified complication: Secondary | ICD-10-CM | POA: Diagnosis not present

## 2017-07-18 DIAGNOSIS — M86371 Chronic multifocal osteomyelitis, right ankle and foot: Secondary | ICD-10-CM | POA: Diagnosis not present

## 2017-07-18 LAB — GLUCOSE, CAPILLARY
GLUCOSE-CAPILLARY: 123 mg/dL — AB (ref 65–99)
GLUCOSE-CAPILLARY: 114 mg/dL — AB (ref 65–99)
GLUCOSE-CAPILLARY: 205 mg/dL — AB (ref 65–99)
Glucose-Capillary: 149 mg/dL — ABNORMAL HIGH (ref 65–99)
Glucose-Capillary: 193 mg/dL — ABNORMAL HIGH (ref 65–99)

## 2017-07-18 MED ORDER — AMOXICILLIN 500 MG PO CAPS: 500.0000 mg | ORAL_CAPSULE | Freq: Every day | ORAL | 1 refills | Status: DC

## 2017-07-18 MED ORDER — METRONIDAZOLE 500 MG PO TABS: 500.0000 mg | ORAL_TABLET | Freq: Two times a day (BID) | ORAL | 1 refills | Status: DC

## 2017-07-18 NOTE — Progress Notes (Deleted)
Postoperative Visit (Angio)   History of Present Illness   [name] is a 84 y.o. female who presents cc:  ***.  Prior procedure include: 1.  PTA+S R SFA by Dr. Darrick Penna (06/24/17) for R 1st toe ulcer and 5th dry gangrene 2.  Ao, LRo by Dr. Randie Heinz (07/05/16) 3.  L groin exploraiton (07/07/15) for aborted thigh AVG placement  The patient's wounds are *** healed.  The patient notes *** resolution of lower extremity symptoms.  The patient is *** able to complete their activities of daily living.  The patient's current symptoms are: ***.  Pt wounds are being managed by wound care and Podiatry.  Past Medical History, Past Surgical History, Social History, Family History, Medications, Allergies, and Review of Systems are unchanged from previous evaluation on 06/24/17.  Current Outpatient Medications  Medication Sig Dispense Refill  . acetaminophen (TYLENOL) 500 MG tablet Take 1,000 mg by mouth every 6 (six) hours as needed for mild pain or headache.     Marland Kitchen aspirin 81 MG tablet Take 81 mg by mouth daily.     . cephALEXin (KEFLEX) 500 MG capsule Take 1 capsule (500 mg total) by mouth 2 (two) times daily. (Patient not taking: Reported on 07/11/2017) 30 capsule 2  . clopidogrel (PLAVIX) 75 MG tablet Take 1 tablet (75 mg total) by mouth daily. 30 tablet 11  . Doxercalciferol (HECTOROL) 2 MCG/ML SOLN Inject 3 mcg into the vein 3 (three) times a week.    . gabapentin (NEURONTIN) 100 MG capsule Take 100 mg by mouth at bedtime.     . IRON SUCROSE IV Inject 1 Dose into the vein once a week.    . Methoxy PEG-Epoetin Beta (MIRCERA) 75 MCG/0.3ML SOSY Inject 75 mcg as directed every 14 (fourteen) days.    . mupirocin ointment (BACTROBAN) 2 % Apply 1 application topically 2 (two) times daily. 30 g 2  . oxyCODONE-acetaminophen (PERCOCET/ROXICET) 5-325 MG tablet Take 1 tablet by mouth every 6 (six) hours as needed. (Patient not taking: Reported on 07/11/2017) 10 tablet 0  . RENVELA 800 MG tablet Take 1,600-2,400  mg by mouth See admin instructions. Take 2400 mg by mouth 3 times daily with meals and take 1600 mg by mouth with snacks    . SENSIPAR 30 MG tablet Take 30 mg by mouth every evening.     . silver sulfADIAZINE (SILVADENE) 1 % cream Apply 1 application topically daily. (Patient not taking: Reported on 07/11/2017) 50 g 0   Current Facility-Administered Medications  Medication Dose Route Frequency Provider Last Rate Last Dose  . protamine injection 25 mg  25 mg Intravenous Once Sherren Kerns, MD        ROS: ***,***   For VQI Use Only   PRE-ADM LIVING: {VQI Pre-admission Living:20973}  AMB STATUS: {VQI Ambulatory Status:20974}   Physical Examination  ***There were no vitals filed for this visit. ***There is no height or weight on file to calculate BMI.  General {LOC:19197::"Somulent","Alert"}, {Orientation:19197::"Confused","O x 3"}, {Weight:19197::"Obese","Cachectic","WD"}, {General state of health:19197::"Ill appearing","NAD"}  Pulmonary {Chest wall:19197::"Asx chest movement","Sym exp"}, {Air movt:19197::"Decreased *** air movt","good B air movt"}, {BS:19197::"rales on ***","rhonchi on ***","wheezing on ***","CTA B"}  Cardiac {Rhythm:19197::"Irregularly, irregular rate and rhythm","RRR, Nl S1, S2"}, {Murmur:19197::"Murmur present: ***","no Murmurs"}, {Rubs:19197::"Rub present: ***","No rubs"}, {Gallop:19197::"Gallop present: ***","No S3,S4"}  Vascular Vessel Right Left  Radial {Palpable:19197::"Not palpable","Faintly palpable","Palpable"} {Palpable:19197::"Not palpable","Faintly palpable","Palpable"}  Brachial {Palpable:19197::"Not palpable","Faintly palpable","Palpable"} {Palpable:19197::"Not palpable","Faintly palpable","Palpable"}  Carotid Palpable, {Bruit:19197::"Bruit present","No Bruit"} Palpable, {Bruit:19197::"Bruit present","No Bruit"}  Aorta Not  palpable N/A  Femoral {Palpable:19197::"Not palpable","Faintly palpable","Palpable"} {Palpable:19197::"Not palpable","Faintly  palpable","Palpable"}  Popliteal {Palpable:19197::"Prominently palpable","Not palpable"} {Palpable:19197::"Prominently palpable","Not palpable"}  PT {Palpable:19197::"Not palpable","Faintly palpable","Palpable"} {Palpable:19197::"Not palpable","Faintly palpable","Palpable"}  DP {Palpable:19197::"Not palpable","Faintly palpable","Palpable"} {Palpable:19197::"Not palpable","Faintly palpable","Palpable"}    Gastrointestinal soft, {Distension:19197::"distended","non-distended"}, {TTP:19197::"TTP in *** quadrant","appropriate tenderness to palpation","non-tender to palpation"}, {Guarding:19197::"Guarding and rebound in *** quadrant","No guarding or rebound"}, {HSM:19197::"HSM present","no HSM"}, {Masses:19197::"Mass present: ***","no masses"}, {Flank:19197::"CVAT on ***","Flank bruit present on ***","no CVAT B"}, {AAA:19197::"Palpable prominent aortic pulse","No palpable prominent aortic pulse"}, {Surgical incision:19197::"Surg incisions: ***","Surgical incisions well healed"," "}  Musculoskeletal M/S 5/5 throughout {MS:19197::"except ***"," "}, Extremities without ischemic changes {MS:19197::"except ***"," "}, {Edema:19197::"Edema present: ***","No edema present"},  Neurologic  Pain and light touch intact in extremities{CN:19197::" except for decreased sensation in ***"," "}, Motor exam as listed above    Medical Decision Making   [name] is a 84 y.o. female who presents BLE CLI (B foot wounds), s/p Known L SFA serial stenoses and single vessel runoff via peroneal, s/p R SFA PTA+S  Based on his angiographic findings, this patient needs: ***. I discussed in depth with the patient the nature of atherosclerosis, and emphasized the importance of maximal medical management including strict control of blood pressure, blood glucose, and lipid levels, obtaining regular exercise, and cessation of smoking.  The patient is aware that without maximal medical management the underlying atherosclerotic disease  process will progress, limiting the benefit of any interventions. The patient is currently not on on statin as not medically indicated.  The patient is currently on an anti-platelet: ASA and Plavix.  Thank you for allowing us to participate in this patient's care.   Leonides SakeBrian Wisdom Seybold, MD, FACS [facility] [place] Office: [phone] Pager: [phone]

## 2017-07-18 NOTE — Addendum Note (Signed)
Addended by: Cliffton AstersAMPBELL, Aairah Negrette on: 07/18/2017 04:25 PM   Modules accepted: Orders

## 2017-07-19 DIAGNOSIS — E1129 Type 2 diabetes mellitus with other diabetic kidney complication: Secondary | ICD-10-CM | POA: Diagnosis not present

## 2017-07-19 DIAGNOSIS — D631 Anemia in chronic kidney disease: Secondary | ICD-10-CM | POA: Diagnosis not present

## 2017-07-19 DIAGNOSIS — E1169 Type 2 diabetes mellitus with other specified complication: Secondary | ICD-10-CM | POA: Diagnosis not present

## 2017-07-19 DIAGNOSIS — E1142 Type 2 diabetes mellitus with diabetic polyneuropathy: Secondary | ICD-10-CM | POA: Diagnosis not present

## 2017-07-19 DIAGNOSIS — L97514 Non-pressure chronic ulcer of other part of right foot with necrosis of bone: Secondary | ICD-10-CM | POA: Diagnosis not present

## 2017-07-19 DIAGNOSIS — M86371 Chronic multifocal osteomyelitis, right ankle and foot: Secondary | ICD-10-CM | POA: Diagnosis not present

## 2017-07-19 DIAGNOSIS — E11621 Type 2 diabetes mellitus with foot ulcer: Secondary | ICD-10-CM | POA: Diagnosis not present

## 2017-07-19 DIAGNOSIS — Z992 Dependence on renal dialysis: Secondary | ICD-10-CM | POA: Diagnosis not present

## 2017-07-19 DIAGNOSIS — N186 End stage renal disease: Secondary | ICD-10-CM | POA: Diagnosis not present

## 2017-07-19 DIAGNOSIS — N2581 Secondary hyperparathyroidism of renal origin: Secondary | ICD-10-CM | POA: Diagnosis not present

## 2017-07-19 DIAGNOSIS — M86671 Other chronic osteomyelitis, right ankle and foot: Secondary | ICD-10-CM | POA: Diagnosis not present

## 2017-07-19 DIAGNOSIS — E1151 Type 2 diabetes mellitus with diabetic peripheral angiopathy without gangrene: Secondary | ICD-10-CM | POA: Diagnosis not present

## 2017-07-19 DIAGNOSIS — D509 Iron deficiency anemia, unspecified: Secondary | ICD-10-CM | POA: Diagnosis not present

## 2017-07-20 ENCOUNTER — Encounter: Payer: Medicare Other | Admitting: Vascular Surgery

## 2017-07-20 DIAGNOSIS — E1169 Type 2 diabetes mellitus with other specified complication: Secondary | ICD-10-CM | POA: Diagnosis not present

## 2017-07-20 DIAGNOSIS — E1151 Type 2 diabetes mellitus with diabetic peripheral angiopathy without gangrene: Secondary | ICD-10-CM | POA: Diagnosis not present

## 2017-07-20 DIAGNOSIS — E11621 Type 2 diabetes mellitus with foot ulcer: Secondary | ICD-10-CM | POA: Diagnosis not present

## 2017-07-20 DIAGNOSIS — E1142 Type 2 diabetes mellitus with diabetic polyneuropathy: Secondary | ICD-10-CM | POA: Diagnosis not present

## 2017-07-20 DIAGNOSIS — M86371 Chronic multifocal osteomyelitis, right ankle and foot: Secondary | ICD-10-CM | POA: Diagnosis not present

## 2017-07-20 DIAGNOSIS — L97514 Non-pressure chronic ulcer of other part of right foot with necrosis of bone: Secondary | ICD-10-CM | POA: Diagnosis not present

## 2017-07-20 DIAGNOSIS — M8668 Other chronic osteomyelitis, other site: Secondary | ICD-10-CM | POA: Diagnosis not present

## 2017-07-20 LAB — GLUCOSE, CAPILLARY
GLUCOSE-CAPILLARY: 127 mg/dL — AB (ref 65–99)
GLUCOSE-CAPILLARY: 136 mg/dL — AB (ref 65–99)
Glucose-Capillary: 151 mg/dL — ABNORMAL HIGH (ref 65–99)
Glucose-Capillary: 224 mg/dL — ABNORMAL HIGH (ref 65–99)
Glucose-Capillary: 127 mg/dL — ABNORMAL HIGH (ref 65–99)

## 2017-07-21 DIAGNOSIS — E1129 Type 2 diabetes mellitus with other diabetic kidney complication: Secondary | ICD-10-CM | POA: Diagnosis not present

## 2017-07-21 DIAGNOSIS — E1151 Type 2 diabetes mellitus with diabetic peripheral angiopathy without gangrene: Secondary | ICD-10-CM | POA: Diagnosis not present

## 2017-07-21 DIAGNOSIS — Z992 Dependence on renal dialysis: Secondary | ICD-10-CM | POA: Diagnosis not present

## 2017-07-21 DIAGNOSIS — L97514 Non-pressure chronic ulcer of other part of right foot with necrosis of bone: Secondary | ICD-10-CM | POA: Diagnosis not present

## 2017-07-21 DIAGNOSIS — M8668 Other chronic osteomyelitis, other site: Secondary | ICD-10-CM | POA: Diagnosis not present

## 2017-07-21 DIAGNOSIS — D631 Anemia in chronic kidney disease: Secondary | ICD-10-CM | POA: Diagnosis not present

## 2017-07-21 DIAGNOSIS — E1169 Type 2 diabetes mellitus with other specified complication: Secondary | ICD-10-CM | POA: Diagnosis not present

## 2017-07-21 DIAGNOSIS — M86371 Chronic multifocal osteomyelitis, right ankle and foot: Secondary | ICD-10-CM | POA: Diagnosis not present

## 2017-07-21 DIAGNOSIS — N2581 Secondary hyperparathyroidism of renal origin: Secondary | ICD-10-CM | POA: Diagnosis not present

## 2017-07-21 DIAGNOSIS — N186 End stage renal disease: Secondary | ICD-10-CM | POA: Diagnosis not present

## 2017-07-21 DIAGNOSIS — E1142 Type 2 diabetes mellitus with diabetic polyneuropathy: Secondary | ICD-10-CM | POA: Diagnosis not present

## 2017-07-21 DIAGNOSIS — E11621 Type 2 diabetes mellitus with foot ulcer: Secondary | ICD-10-CM | POA: Diagnosis not present

## 2017-07-21 DIAGNOSIS — D509 Iron deficiency anemia, unspecified: Secondary | ICD-10-CM | POA: Diagnosis not present

## 2017-07-21 LAB — GLUCOSE, CAPILLARY
Glucose-Capillary: 113 mg/dL — ABNORMAL HIGH (ref 65–99)
Glucose-Capillary: 130 mg/dL — ABNORMAL HIGH (ref 65–99)

## 2017-07-22 DIAGNOSIS — E11621 Type 2 diabetes mellitus with foot ulcer: Secondary | ICD-10-CM | POA: Diagnosis not present

## 2017-07-22 DIAGNOSIS — M8668 Other chronic osteomyelitis, other site: Secondary | ICD-10-CM | POA: Diagnosis not present

## 2017-07-22 DIAGNOSIS — M86371 Chronic multifocal osteomyelitis, right ankle and foot: Secondary | ICD-10-CM | POA: Diagnosis not present

## 2017-07-22 DIAGNOSIS — E1142 Type 2 diabetes mellitus with diabetic polyneuropathy: Secondary | ICD-10-CM | POA: Diagnosis not present

## 2017-07-22 DIAGNOSIS — L97514 Non-pressure chronic ulcer of other part of right foot with necrosis of bone: Secondary | ICD-10-CM | POA: Diagnosis not present

## 2017-07-22 DIAGNOSIS — E1169 Type 2 diabetes mellitus with other specified complication: Secondary | ICD-10-CM | POA: Diagnosis not present

## 2017-07-22 DIAGNOSIS — E1151 Type 2 diabetes mellitus with diabetic peripheral angiopathy without gangrene: Secondary | ICD-10-CM | POA: Diagnosis not present

## 2017-07-22 LAB — GLUCOSE, CAPILLARY
GLUCOSE-CAPILLARY: 179 mg/dL — AB (ref 65–99)
Glucose-Capillary: 119 mg/dL — ABNORMAL HIGH (ref 65–99)
Glucose-Capillary: 151 mg/dL — ABNORMAL HIGH (ref 65–99)

## 2017-07-23 DIAGNOSIS — N2581 Secondary hyperparathyroidism of renal origin: Secondary | ICD-10-CM | POA: Diagnosis not present

## 2017-07-23 DIAGNOSIS — E1129 Type 2 diabetes mellitus with other diabetic kidney complication: Secondary | ICD-10-CM | POA: Diagnosis not present

## 2017-07-23 DIAGNOSIS — D509 Iron deficiency anemia, unspecified: Secondary | ICD-10-CM | POA: Diagnosis not present

## 2017-07-23 DIAGNOSIS — D631 Anemia in chronic kidney disease: Secondary | ICD-10-CM | POA: Diagnosis not present

## 2017-07-23 DIAGNOSIS — Z992 Dependence on renal dialysis: Secondary | ICD-10-CM | POA: Diagnosis not present

## 2017-07-23 DIAGNOSIS — N186 End stage renal disease: Secondary | ICD-10-CM | POA: Diagnosis not present

## 2017-07-25 DIAGNOSIS — E1169 Type 2 diabetes mellitus with other specified complication: Secondary | ICD-10-CM | POA: Diagnosis not present

## 2017-07-25 DIAGNOSIS — E11621 Type 2 diabetes mellitus with foot ulcer: Secondary | ICD-10-CM | POA: Diagnosis not present

## 2017-07-25 DIAGNOSIS — M8668 Other chronic osteomyelitis, other site: Secondary | ICD-10-CM | POA: Diagnosis not present

## 2017-07-25 DIAGNOSIS — E1151 Type 2 diabetes mellitus with diabetic peripheral angiopathy without gangrene: Secondary | ICD-10-CM | POA: Diagnosis not present

## 2017-07-25 DIAGNOSIS — L97514 Non-pressure chronic ulcer of other part of right foot with necrosis of bone: Secondary | ICD-10-CM | POA: Diagnosis not present

## 2017-07-25 DIAGNOSIS — E1142 Type 2 diabetes mellitus with diabetic polyneuropathy: Secondary | ICD-10-CM | POA: Diagnosis not present

## 2017-07-25 DIAGNOSIS — M86371 Chronic multifocal osteomyelitis, right ankle and foot: Secondary | ICD-10-CM | POA: Diagnosis not present

## 2017-07-25 LAB — GLUCOSE, CAPILLARY: GLUCOSE-CAPILLARY: 166 mg/dL — AB (ref 65–99)

## 2017-07-26 DIAGNOSIS — E1151 Type 2 diabetes mellitus with diabetic peripheral angiopathy without gangrene: Secondary | ICD-10-CM | POA: Diagnosis not present

## 2017-07-26 DIAGNOSIS — D631 Anemia in chronic kidney disease: Secondary | ICD-10-CM | POA: Diagnosis not present

## 2017-07-26 DIAGNOSIS — Z992 Dependence on renal dialysis: Secondary | ICD-10-CM | POA: Diagnosis not present

## 2017-07-26 DIAGNOSIS — E1142 Type 2 diabetes mellitus with diabetic polyneuropathy: Secondary | ICD-10-CM | POA: Diagnosis not present

## 2017-07-26 DIAGNOSIS — N186 End stage renal disease: Secondary | ICD-10-CM | POA: Diagnosis not present

## 2017-07-26 DIAGNOSIS — E1129 Type 2 diabetes mellitus with other diabetic kidney complication: Secondary | ICD-10-CM | POA: Diagnosis not present

## 2017-07-26 DIAGNOSIS — E11621 Type 2 diabetes mellitus with foot ulcer: Secondary | ICD-10-CM | POA: Diagnosis not present

## 2017-07-26 DIAGNOSIS — N2581 Secondary hyperparathyroidism of renal origin: Secondary | ICD-10-CM | POA: Diagnosis not present

## 2017-07-26 DIAGNOSIS — E1169 Type 2 diabetes mellitus with other specified complication: Secondary | ICD-10-CM | POA: Diagnosis not present

## 2017-07-26 DIAGNOSIS — M86371 Chronic multifocal osteomyelitis, right ankle and foot: Secondary | ICD-10-CM | POA: Diagnosis not present

## 2017-07-26 DIAGNOSIS — D509 Iron deficiency anemia, unspecified: Secondary | ICD-10-CM | POA: Diagnosis not present

## 2017-07-26 DIAGNOSIS — L97514 Non-pressure chronic ulcer of other part of right foot with necrosis of bone: Secondary | ICD-10-CM | POA: Diagnosis not present

## 2017-07-26 DIAGNOSIS — M8668 Other chronic osteomyelitis, other site: Secondary | ICD-10-CM | POA: Diagnosis not present

## 2017-07-26 LAB — GLUCOSE, CAPILLARY
GLUCOSE-CAPILLARY: 99 mg/dL (ref 65–99)
GLUCOSE-CAPILLARY: 122 mg/dL — AB (ref 65–99)
GLUCOSE-CAPILLARY: 198 mg/dL — AB (ref 65–99)
GLUCOSE-CAPILLARY: 135 mg/dL — AB (ref 65–99)
Glucose-Capillary: 135 mg/dL — ABNORMAL HIGH (ref 65–99)

## 2017-07-27 DIAGNOSIS — E1142 Type 2 diabetes mellitus with diabetic polyneuropathy: Secondary | ICD-10-CM | POA: Diagnosis not present

## 2017-07-27 DIAGNOSIS — E11621 Type 2 diabetes mellitus with foot ulcer: Secondary | ICD-10-CM | POA: Diagnosis not present

## 2017-07-27 DIAGNOSIS — M86371 Chronic multifocal osteomyelitis, right ankle and foot: Secondary | ICD-10-CM | POA: Diagnosis not present

## 2017-07-27 DIAGNOSIS — E1151 Type 2 diabetes mellitus with diabetic peripheral angiopathy without gangrene: Secondary | ICD-10-CM | POA: Diagnosis not present

## 2017-07-27 DIAGNOSIS — E1169 Type 2 diabetes mellitus with other specified complication: Secondary | ICD-10-CM | POA: Diagnosis not present

## 2017-07-27 DIAGNOSIS — L97514 Non-pressure chronic ulcer of other part of right foot with necrosis of bone: Secondary | ICD-10-CM | POA: Diagnosis not present

## 2017-07-27 DIAGNOSIS — M8668 Other chronic osteomyelitis, other site: Secondary | ICD-10-CM | POA: Diagnosis not present

## 2017-07-27 LAB — GLUCOSE, CAPILLARY
GLUCOSE-CAPILLARY: 135 mg/dL — AB (ref 65–99)
GLUCOSE-CAPILLARY: 152 mg/dL — AB (ref 65–99)
Glucose-Capillary: 105 mg/dL — ABNORMAL HIGH (ref 65–99)

## 2017-07-28 DIAGNOSIS — E1169 Type 2 diabetes mellitus with other specified complication: Secondary | ICD-10-CM | POA: Diagnosis not present

## 2017-07-28 DIAGNOSIS — N186 End stage renal disease: Secondary | ICD-10-CM | POA: Diagnosis not present

## 2017-07-28 DIAGNOSIS — E1142 Type 2 diabetes mellitus with diabetic polyneuropathy: Secondary | ICD-10-CM | POA: Diagnosis not present

## 2017-07-28 DIAGNOSIS — E11621 Type 2 diabetes mellitus with foot ulcer: Secondary | ICD-10-CM | POA: Diagnosis not present

## 2017-07-28 DIAGNOSIS — N2581 Secondary hyperparathyroidism of renal origin: Secondary | ICD-10-CM | POA: Diagnosis not present

## 2017-07-28 DIAGNOSIS — L97514 Non-pressure chronic ulcer of other part of right foot with necrosis of bone: Secondary | ICD-10-CM | POA: Diagnosis not present

## 2017-07-28 DIAGNOSIS — D631 Anemia in chronic kidney disease: Secondary | ICD-10-CM | POA: Diagnosis not present

## 2017-07-28 DIAGNOSIS — Z992 Dependence on renal dialysis: Secondary | ICD-10-CM | POA: Diagnosis not present

## 2017-07-28 DIAGNOSIS — E1151 Type 2 diabetes mellitus with diabetic peripheral angiopathy without gangrene: Secondary | ICD-10-CM | POA: Diagnosis not present

## 2017-07-28 DIAGNOSIS — M86371 Chronic multifocal osteomyelitis, right ankle and foot: Secondary | ICD-10-CM | POA: Diagnosis not present

## 2017-07-28 DIAGNOSIS — E1129 Type 2 diabetes mellitus with other diabetic kidney complication: Secondary | ICD-10-CM | POA: Diagnosis not present

## 2017-07-28 DIAGNOSIS — D509 Iron deficiency anemia, unspecified: Secondary | ICD-10-CM | POA: Diagnosis not present

## 2017-07-28 LAB — GLUCOSE, CAPILLARY
GLUCOSE-CAPILLARY: 105 mg/dL — AB (ref 65–99)
Glucose-Capillary: 106 mg/dL — ABNORMAL HIGH (ref 65–99)

## 2017-07-29 ENCOUNTER — Ambulatory Visit: Payer: Medicare Other | Admitting: Vascular Surgery

## 2017-07-29 ENCOUNTER — Encounter (HOSPITAL_COMMUNITY): Payer: Medicare Other

## 2017-07-29 DIAGNOSIS — E11621 Type 2 diabetes mellitus with foot ulcer: Secondary | ICD-10-CM | POA: Diagnosis not present

## 2017-07-29 DIAGNOSIS — L97514 Non-pressure chronic ulcer of other part of right foot with necrosis of bone: Secondary | ICD-10-CM | POA: Diagnosis not present

## 2017-07-29 DIAGNOSIS — E1142 Type 2 diabetes mellitus with diabetic polyneuropathy: Secondary | ICD-10-CM | POA: Diagnosis not present

## 2017-07-29 DIAGNOSIS — M86371 Chronic multifocal osteomyelitis, right ankle and foot: Secondary | ICD-10-CM | POA: Diagnosis not present

## 2017-07-29 DIAGNOSIS — E1169 Type 2 diabetes mellitus with other specified complication: Secondary | ICD-10-CM | POA: Diagnosis not present

## 2017-07-29 DIAGNOSIS — M8668 Other chronic osteomyelitis, other site: Secondary | ICD-10-CM | POA: Diagnosis not present

## 2017-07-29 DIAGNOSIS — E1151 Type 2 diabetes mellitus with diabetic peripheral angiopathy without gangrene: Secondary | ICD-10-CM | POA: Diagnosis not present

## 2017-07-30 DIAGNOSIS — E1129 Type 2 diabetes mellitus with other diabetic kidney complication: Secondary | ICD-10-CM | POA: Diagnosis not present

## 2017-07-30 DIAGNOSIS — D509 Iron deficiency anemia, unspecified: Secondary | ICD-10-CM | POA: Diagnosis not present

## 2017-07-30 DIAGNOSIS — N2581 Secondary hyperparathyroidism of renal origin: Secondary | ICD-10-CM | POA: Diagnosis not present

## 2017-07-30 DIAGNOSIS — N186 End stage renal disease: Secondary | ICD-10-CM | POA: Diagnosis not present

## 2017-07-30 DIAGNOSIS — D631 Anemia in chronic kidney disease: Secondary | ICD-10-CM | POA: Diagnosis not present

## 2017-07-30 DIAGNOSIS — Z992 Dependence on renal dialysis: Secondary | ICD-10-CM | POA: Diagnosis not present

## 2017-08-01 DIAGNOSIS — L97514 Non-pressure chronic ulcer of other part of right foot with necrosis of bone: Secondary | ICD-10-CM | POA: Diagnosis not present

## 2017-08-01 DIAGNOSIS — M86371 Chronic multifocal osteomyelitis, right ankle and foot: Secondary | ICD-10-CM | POA: Diagnosis not present

## 2017-08-01 DIAGNOSIS — E1142 Type 2 diabetes mellitus with diabetic polyneuropathy: Secondary | ICD-10-CM | POA: Diagnosis not present

## 2017-08-01 DIAGNOSIS — E1169 Type 2 diabetes mellitus with other specified complication: Secondary | ICD-10-CM | POA: Diagnosis not present

## 2017-08-01 DIAGNOSIS — M8668 Other chronic osteomyelitis, other site: Secondary | ICD-10-CM | POA: Diagnosis not present

## 2017-08-01 DIAGNOSIS — E1151 Type 2 diabetes mellitus with diabetic peripheral angiopathy without gangrene: Secondary | ICD-10-CM | POA: Diagnosis not present

## 2017-08-01 DIAGNOSIS — E11621 Type 2 diabetes mellitus with foot ulcer: Secondary | ICD-10-CM | POA: Diagnosis not present

## 2017-08-01 LAB — GLUCOSE, CAPILLARY
GLUCOSE-CAPILLARY: 90 mg/dL (ref 65–99)
GLUCOSE-CAPILLARY: 162 mg/dL — AB (ref 65–99)
Glucose-Capillary: 132 mg/dL — ABNORMAL HIGH (ref 65–99)
Glucose-Capillary: 119 mg/dL — ABNORMAL HIGH (ref 65–99)
Glucose-Capillary: 153 mg/dL — ABNORMAL HIGH (ref 65–99)

## 2017-08-02 DIAGNOSIS — E11621 Type 2 diabetes mellitus with foot ulcer: Secondary | ICD-10-CM | POA: Diagnosis not present

## 2017-08-02 DIAGNOSIS — N186 End stage renal disease: Secondary | ICD-10-CM | POA: Diagnosis not present

## 2017-08-02 DIAGNOSIS — Z992 Dependence on renal dialysis: Secondary | ICD-10-CM | POA: Diagnosis not present

## 2017-08-02 DIAGNOSIS — M8668 Other chronic osteomyelitis, other site: Secondary | ICD-10-CM | POA: Diagnosis not present

## 2017-08-02 DIAGNOSIS — E1169 Type 2 diabetes mellitus with other specified complication: Secondary | ICD-10-CM | POA: Diagnosis not present

## 2017-08-02 DIAGNOSIS — D631 Anemia in chronic kidney disease: Secondary | ICD-10-CM | POA: Diagnosis not present

## 2017-08-02 DIAGNOSIS — D509 Iron deficiency anemia, unspecified: Secondary | ICD-10-CM | POA: Diagnosis not present

## 2017-08-02 DIAGNOSIS — L97514 Non-pressure chronic ulcer of other part of right foot with necrosis of bone: Secondary | ICD-10-CM | POA: Diagnosis not present

## 2017-08-02 DIAGNOSIS — N2581 Secondary hyperparathyroidism of renal origin: Secondary | ICD-10-CM | POA: Diagnosis not present

## 2017-08-02 DIAGNOSIS — M86371 Chronic multifocal osteomyelitis, right ankle and foot: Secondary | ICD-10-CM | POA: Diagnosis not present

## 2017-08-02 DIAGNOSIS — E1129 Type 2 diabetes mellitus with other diabetic kidney complication: Secondary | ICD-10-CM | POA: Diagnosis not present

## 2017-08-02 DIAGNOSIS — E1151 Type 2 diabetes mellitus with diabetic peripheral angiopathy without gangrene: Secondary | ICD-10-CM | POA: Diagnosis not present

## 2017-08-02 DIAGNOSIS — E1142 Type 2 diabetes mellitus with diabetic polyneuropathy: Secondary | ICD-10-CM | POA: Diagnosis not present

## 2017-08-02 LAB — GLUCOSE, CAPILLARY
GLUCOSE-CAPILLARY: 199 mg/dL — AB (ref 65–99)
Glucose-Capillary: 136 mg/dL — ABNORMAL HIGH (ref 65–99)
Glucose-Capillary: 93 mg/dL (ref 65–99)

## 2017-08-03 DIAGNOSIS — M8668 Other chronic osteomyelitis, other site: Secondary | ICD-10-CM | POA: Diagnosis not present

## 2017-08-03 DIAGNOSIS — E11621 Type 2 diabetes mellitus with foot ulcer: Secondary | ICD-10-CM | POA: Diagnosis not present

## 2017-08-03 DIAGNOSIS — E1169 Type 2 diabetes mellitus with other specified complication: Secondary | ICD-10-CM | POA: Diagnosis not present

## 2017-08-03 DIAGNOSIS — L97514 Non-pressure chronic ulcer of other part of right foot with necrosis of bone: Secondary | ICD-10-CM | POA: Diagnosis not present

## 2017-08-03 DIAGNOSIS — E1151 Type 2 diabetes mellitus with diabetic peripheral angiopathy without gangrene: Secondary | ICD-10-CM | POA: Diagnosis not present

## 2017-08-03 DIAGNOSIS — M86371 Chronic multifocal osteomyelitis, right ankle and foot: Secondary | ICD-10-CM | POA: Diagnosis not present

## 2017-08-03 DIAGNOSIS — E1142 Type 2 diabetes mellitus with diabetic polyneuropathy: Secondary | ICD-10-CM | POA: Diagnosis not present

## 2017-08-03 LAB — GLUCOSE, CAPILLARY
Glucose-Capillary: 153 mg/dL — ABNORMAL HIGH (ref 65–99)
Glucose-Capillary: 99 mg/dL (ref 65–99)

## 2017-08-04 DIAGNOSIS — E1129 Type 2 diabetes mellitus with other diabetic kidney complication: Secondary | ICD-10-CM | POA: Diagnosis not present

## 2017-08-04 DIAGNOSIS — D631 Anemia in chronic kidney disease: Secondary | ICD-10-CM | POA: Diagnosis not present

## 2017-08-04 DIAGNOSIS — N2581 Secondary hyperparathyroidism of renal origin: Secondary | ICD-10-CM | POA: Diagnosis not present

## 2017-08-04 DIAGNOSIS — L97514 Non-pressure chronic ulcer of other part of right foot with necrosis of bone: Secondary | ICD-10-CM | POA: Diagnosis not present

## 2017-08-04 DIAGNOSIS — E1169 Type 2 diabetes mellitus with other specified complication: Secondary | ICD-10-CM | POA: Diagnosis not present

## 2017-08-04 DIAGNOSIS — E11621 Type 2 diabetes mellitus with foot ulcer: Secondary | ICD-10-CM | POA: Diagnosis not present

## 2017-08-04 DIAGNOSIS — D509 Iron deficiency anemia, unspecified: Secondary | ICD-10-CM | POA: Diagnosis not present

## 2017-08-04 DIAGNOSIS — M86371 Chronic multifocal osteomyelitis, right ankle and foot: Secondary | ICD-10-CM | POA: Diagnosis not present

## 2017-08-04 DIAGNOSIS — Z992 Dependence on renal dialysis: Secondary | ICD-10-CM | POA: Diagnosis not present

## 2017-08-04 DIAGNOSIS — E1142 Type 2 diabetes mellitus with diabetic polyneuropathy: Secondary | ICD-10-CM | POA: Diagnosis not present

## 2017-08-04 DIAGNOSIS — N186 End stage renal disease: Secondary | ICD-10-CM | POA: Diagnosis not present

## 2017-08-04 DIAGNOSIS — E1151 Type 2 diabetes mellitus with diabetic peripheral angiopathy without gangrene: Secondary | ICD-10-CM | POA: Diagnosis not present

## 2017-08-04 LAB — GLUCOSE, CAPILLARY
GLUCOSE-CAPILLARY: 111 mg/dL — AB (ref 65–99)
Glucose-Capillary: 109 mg/dL — ABNORMAL HIGH (ref 65–99)

## 2017-08-05 DIAGNOSIS — E11621 Type 2 diabetes mellitus with foot ulcer: Secondary | ICD-10-CM | POA: Diagnosis not present

## 2017-08-05 DIAGNOSIS — E1142 Type 2 diabetes mellitus with diabetic polyneuropathy: Secondary | ICD-10-CM | POA: Diagnosis not present

## 2017-08-05 DIAGNOSIS — M8668 Other chronic osteomyelitis, other site: Secondary | ICD-10-CM | POA: Diagnosis not present

## 2017-08-05 DIAGNOSIS — E1151 Type 2 diabetes mellitus with diabetic peripheral angiopathy without gangrene: Secondary | ICD-10-CM | POA: Diagnosis not present

## 2017-08-05 DIAGNOSIS — L97514 Non-pressure chronic ulcer of other part of right foot with necrosis of bone: Secondary | ICD-10-CM | POA: Diagnosis not present

## 2017-08-05 DIAGNOSIS — M86371 Chronic multifocal osteomyelitis, right ankle and foot: Secondary | ICD-10-CM | POA: Diagnosis not present

## 2017-08-05 DIAGNOSIS — E1169 Type 2 diabetes mellitus with other specified complication: Secondary | ICD-10-CM | POA: Diagnosis not present

## 2017-08-05 LAB — GLUCOSE, CAPILLARY: Glucose-Capillary: 136 mg/dL — ABNORMAL HIGH (ref 65–99)

## 2017-08-06 DIAGNOSIS — N2581 Secondary hyperparathyroidism of renal origin: Secondary | ICD-10-CM | POA: Diagnosis not present

## 2017-08-06 DIAGNOSIS — Z992 Dependence on renal dialysis: Secondary | ICD-10-CM | POA: Diagnosis not present

## 2017-08-06 DIAGNOSIS — D509 Iron deficiency anemia, unspecified: Secondary | ICD-10-CM | POA: Diagnosis not present

## 2017-08-06 DIAGNOSIS — N186 End stage renal disease: Secondary | ICD-10-CM | POA: Diagnosis not present

## 2017-08-06 DIAGNOSIS — E1129 Type 2 diabetes mellitus with other diabetic kidney complication: Secondary | ICD-10-CM | POA: Diagnosis not present

## 2017-08-06 DIAGNOSIS — D631 Anemia in chronic kidney disease: Secondary | ICD-10-CM | POA: Diagnosis not present

## 2017-08-08 ENCOUNTER — Other Ambulatory Visit (HOSPITAL_BASED_OUTPATIENT_CLINIC_OR_DEPARTMENT_OTHER): Payer: Self-pay | Admitting: Internal Medicine

## 2017-08-08 ENCOUNTER — Other Ambulatory Visit (HOSPITAL_COMMUNITY)
Admission: RE | Admit: 2017-08-08 | Discharge: 2017-08-08 | Disposition: A | Discharge status: Home / Self Care | Payer: Medicare Other | Source: Other Acute Inpatient Hospital | Attending: Internal Medicine | Admitting: Internal Medicine

## 2017-08-08 ENCOUNTER — Encounter (HOSPITAL_BASED_OUTPATIENT_CLINIC_OR_DEPARTMENT_OTHER): Payer: Medicare Other | Attending: Internal Medicine

## 2017-08-08 DIAGNOSIS — E1142 Type 2 diabetes mellitus with diabetic polyneuropathy: Secondary | ICD-10-CM | POA: Insufficient documentation

## 2017-08-08 DIAGNOSIS — D631 Anemia in chronic kidney disease: Secondary | ICD-10-CM | POA: Diagnosis not present

## 2017-08-08 DIAGNOSIS — E1169 Type 2 diabetes mellitus with other specified complication: Secondary | ICD-10-CM | POA: Diagnosis not present

## 2017-08-08 DIAGNOSIS — L97516 Non-pressure chronic ulcer of other part of right foot with bone involvement without evidence of necrosis: Secondary | ICD-10-CM | POA: Diagnosis not present

## 2017-08-08 DIAGNOSIS — N186 End stage renal disease: Secondary | ICD-10-CM | POA: Insufficient documentation

## 2017-08-08 DIAGNOSIS — E1151 Type 2 diabetes mellitus with diabetic peripheral angiopathy without gangrene: Secondary | ICD-10-CM | POA: Insufficient documentation

## 2017-08-08 DIAGNOSIS — E1122 Type 2 diabetes mellitus with diabetic chronic kidney disease: Secondary | ICD-10-CM | POA: Insufficient documentation

## 2017-08-08 DIAGNOSIS — M8668 Other chronic osteomyelitis, other site: Secondary | ICD-10-CM | POA: Diagnosis not present

## 2017-08-08 DIAGNOSIS — N2581 Secondary hyperparathyroidism of renal origin: Secondary | ICD-10-CM | POA: Diagnosis not present

## 2017-08-08 DIAGNOSIS — E11621 Type 2 diabetes mellitus with foot ulcer: Secondary | ICD-10-CM | POA: Diagnosis not present

## 2017-08-08 DIAGNOSIS — Z992 Dependence on renal dialysis: Secondary | ICD-10-CM | POA: Insufficient documentation

## 2017-08-08 DIAGNOSIS — M86671 Other chronic osteomyelitis, right ankle and foot: Secondary | ICD-10-CM | POA: Diagnosis not present

## 2017-08-08 DIAGNOSIS — E1129 Type 2 diabetes mellitus with other diabetic kidney complication: Secondary | ICD-10-CM | POA: Diagnosis not present

## 2017-08-08 DIAGNOSIS — M86171 Other acute osteomyelitis, right ankle and foot: Secondary | ICD-10-CM | POA: Diagnosis not present

## 2017-08-08 DIAGNOSIS — D509 Iron deficiency anemia, unspecified: Secondary | ICD-10-CM | POA: Diagnosis not present

## 2017-08-08 LAB — GLUCOSE, CAPILLARY
GLUCOSE-CAPILLARY: 119 mg/dL — AB (ref 65–99)
GLUCOSE-CAPILLARY: 149 mg/dL — AB (ref 65–99)
GLUCOSE-CAPILLARY: 107 mg/dL — AB (ref 65–99)

## 2017-08-09 DIAGNOSIS — M86671 Other chronic osteomyelitis, right ankle and foot: Secondary | ICD-10-CM | POA: Diagnosis not present

## 2017-08-09 DIAGNOSIS — E1151 Type 2 diabetes mellitus with diabetic peripheral angiopathy without gangrene: Secondary | ICD-10-CM | POA: Diagnosis not present

## 2017-08-09 DIAGNOSIS — E1142 Type 2 diabetes mellitus with diabetic polyneuropathy: Secondary | ICD-10-CM | POA: Diagnosis not present

## 2017-08-09 DIAGNOSIS — L97516 Non-pressure chronic ulcer of other part of right foot with bone involvement without evidence of necrosis: Secondary | ICD-10-CM | POA: Diagnosis not present

## 2017-08-09 DIAGNOSIS — N2581 Secondary hyperparathyroidism of renal origin: Secondary | ICD-10-CM | POA: Diagnosis not present

## 2017-08-09 DIAGNOSIS — N186 End stage renal disease: Secondary | ICD-10-CM | POA: Diagnosis not present

## 2017-08-09 DIAGNOSIS — D631 Anemia in chronic kidney disease: Secondary | ICD-10-CM | POA: Diagnosis not present

## 2017-08-09 DIAGNOSIS — E11621 Type 2 diabetes mellitus with foot ulcer: Secondary | ICD-10-CM | POA: Diagnosis not present

## 2017-08-09 DIAGNOSIS — D509 Iron deficiency anemia, unspecified: Secondary | ICD-10-CM | POA: Diagnosis not present

## 2017-08-09 DIAGNOSIS — E1169 Type 2 diabetes mellitus with other specified complication: Secondary | ICD-10-CM | POA: Diagnosis not present

## 2017-08-09 DIAGNOSIS — M8668 Other chronic osteomyelitis, other site: Secondary | ICD-10-CM | POA: Diagnosis not present

## 2017-08-09 LAB — GLUCOSE, CAPILLARY
GLUCOSE-CAPILLARY: 120 mg/dL — AB (ref 65–99)
Glucose-Capillary: 95 mg/dL (ref 65–99)
Glucose-Capillary: 160 mg/dL — ABNORMAL HIGH (ref 65–99)

## 2017-08-10 ENCOUNTER — Ambulatory Visit: Payer: Medicare Other | Admitting: Internal Medicine

## 2017-08-10 DIAGNOSIS — E1151 Type 2 diabetes mellitus with diabetic peripheral angiopathy without gangrene: Secondary | ICD-10-CM | POA: Diagnosis not present

## 2017-08-10 DIAGNOSIS — E11621 Type 2 diabetes mellitus with foot ulcer: Secondary | ICD-10-CM | POA: Diagnosis not present

## 2017-08-10 DIAGNOSIS — E1142 Type 2 diabetes mellitus with diabetic polyneuropathy: Secondary | ICD-10-CM | POA: Diagnosis not present

## 2017-08-10 DIAGNOSIS — E1169 Type 2 diabetes mellitus with other specified complication: Secondary | ICD-10-CM | POA: Diagnosis not present

## 2017-08-10 DIAGNOSIS — M86671 Other chronic osteomyelitis, right ankle and foot: Secondary | ICD-10-CM | POA: Diagnosis not present

## 2017-08-10 DIAGNOSIS — L97516 Non-pressure chronic ulcer of other part of right foot with bone involvement without evidence of necrosis: Secondary | ICD-10-CM | POA: Diagnosis not present

## 2017-08-10 DIAGNOSIS — M8668 Other chronic osteomyelitis, other site: Secondary | ICD-10-CM | POA: Diagnosis not present

## 2017-08-10 LAB — GLUCOSE, CAPILLARY
GLUCOSE-CAPILLARY: 99 mg/dL (ref 65–99)
Glucose-Capillary: 130 mg/dL — ABNORMAL HIGH (ref 65–99)

## 2017-08-11 ENCOUNTER — Encounter (HOSPITAL_BASED_OUTPATIENT_CLINIC_OR_DEPARTMENT_OTHER): Payer: Medicare Other

## 2017-08-11 DIAGNOSIS — L97516 Non-pressure chronic ulcer of other part of right foot with bone involvement without evidence of necrosis: Secondary | ICD-10-CM | POA: Diagnosis not present

## 2017-08-11 DIAGNOSIS — E1151 Type 2 diabetes mellitus with diabetic peripheral angiopathy without gangrene: Secondary | ICD-10-CM | POA: Diagnosis not present

## 2017-08-11 DIAGNOSIS — E1169 Type 2 diabetes mellitus with other specified complication: Secondary | ICD-10-CM | POA: Diagnosis not present

## 2017-08-11 DIAGNOSIS — E1142 Type 2 diabetes mellitus with diabetic polyneuropathy: Secondary | ICD-10-CM | POA: Diagnosis not present

## 2017-08-11 DIAGNOSIS — N2581 Secondary hyperparathyroidism of renal origin: Secondary | ICD-10-CM | POA: Diagnosis not present

## 2017-08-11 DIAGNOSIS — D509 Iron deficiency anemia, unspecified: Secondary | ICD-10-CM | POA: Diagnosis not present

## 2017-08-11 DIAGNOSIS — D631 Anemia in chronic kidney disease: Secondary | ICD-10-CM | POA: Diagnosis not present

## 2017-08-11 DIAGNOSIS — M86671 Other chronic osteomyelitis, right ankle and foot: Secondary | ICD-10-CM | POA: Diagnosis not present

## 2017-08-11 DIAGNOSIS — E11621 Type 2 diabetes mellitus with foot ulcer: Secondary | ICD-10-CM | POA: Diagnosis not present

## 2017-08-11 DIAGNOSIS — N186 End stage renal disease: Secondary | ICD-10-CM | POA: Diagnosis not present

## 2017-08-11 DIAGNOSIS — M8668 Other chronic osteomyelitis, other site: Secondary | ICD-10-CM | POA: Diagnosis not present

## 2017-08-11 LAB — GLUCOSE, CAPILLARY
Glucose-Capillary: 133 mg/dL — ABNORMAL HIGH (ref 65–99)
Glucose-Capillary: 115 mg/dL — ABNORMAL HIGH (ref 65–99)

## 2017-08-12 DIAGNOSIS — M86671 Other chronic osteomyelitis, right ankle and foot: Secondary | ICD-10-CM | POA: Diagnosis not present

## 2017-08-12 DIAGNOSIS — L97516 Non-pressure chronic ulcer of other part of right foot with bone involvement without evidence of necrosis: Secondary | ICD-10-CM | POA: Diagnosis not present

## 2017-08-12 DIAGNOSIS — M8668 Other chronic osteomyelitis, other site: Secondary | ICD-10-CM | POA: Diagnosis not present

## 2017-08-12 DIAGNOSIS — E1169 Type 2 diabetes mellitus with other specified complication: Secondary | ICD-10-CM | POA: Diagnosis not present

## 2017-08-12 DIAGNOSIS — E1142 Type 2 diabetes mellitus with diabetic polyneuropathy: Secondary | ICD-10-CM | POA: Diagnosis not present

## 2017-08-12 DIAGNOSIS — E11621 Type 2 diabetes mellitus with foot ulcer: Secondary | ICD-10-CM | POA: Diagnosis not present

## 2017-08-12 DIAGNOSIS — E1151 Type 2 diabetes mellitus with diabetic peripheral angiopathy without gangrene: Secondary | ICD-10-CM | POA: Diagnosis not present

## 2017-08-12 LAB — GLUCOSE, CAPILLARY
GLUCOSE-CAPILLARY: 130 mg/dL — AB (ref 65–99)
Glucose-Capillary: 96 mg/dL (ref 65–99)

## 2017-08-13 DIAGNOSIS — D631 Anemia in chronic kidney disease: Secondary | ICD-10-CM | POA: Diagnosis not present

## 2017-08-13 DIAGNOSIS — N2581 Secondary hyperparathyroidism of renal origin: Secondary | ICD-10-CM | POA: Diagnosis not present

## 2017-08-13 DIAGNOSIS — N186 End stage renal disease: Secondary | ICD-10-CM | POA: Diagnosis not present

## 2017-08-13 DIAGNOSIS — D509 Iron deficiency anemia, unspecified: Secondary | ICD-10-CM | POA: Diagnosis not present

## 2017-08-13 LAB — AEROBIC/ANAEROBIC CULTURE W GRAM STAIN (SURGICAL/DEEP WOUND)

## 2017-08-13 LAB — AEROBIC/ANAEROBIC CULTURE (SURGICAL/DEEP WOUND)

## 2017-08-15 DIAGNOSIS — M86671 Other chronic osteomyelitis, right ankle and foot: Secondary | ICD-10-CM | POA: Diagnosis not present

## 2017-08-15 DIAGNOSIS — M8668 Other chronic osteomyelitis, other site: Secondary | ICD-10-CM | POA: Diagnosis not present

## 2017-08-15 DIAGNOSIS — E1151 Type 2 diabetes mellitus with diabetic peripheral angiopathy without gangrene: Secondary | ICD-10-CM | POA: Diagnosis not present

## 2017-08-15 DIAGNOSIS — E1142 Type 2 diabetes mellitus with diabetic polyneuropathy: Secondary | ICD-10-CM | POA: Diagnosis not present

## 2017-08-15 DIAGNOSIS — L97516 Non-pressure chronic ulcer of other part of right foot with bone involvement without evidence of necrosis: Secondary | ICD-10-CM | POA: Diagnosis not present

## 2017-08-15 DIAGNOSIS — E11621 Type 2 diabetes mellitus with foot ulcer: Secondary | ICD-10-CM | POA: Diagnosis not present

## 2017-08-15 DIAGNOSIS — L97514 Non-pressure chronic ulcer of other part of right foot with necrosis of bone: Secondary | ICD-10-CM | POA: Diagnosis not present

## 2017-08-15 DIAGNOSIS — E1169 Type 2 diabetes mellitus with other specified complication: Secondary | ICD-10-CM | POA: Diagnosis not present

## 2017-08-15 LAB — GLUCOSE, CAPILLARY
GLUCOSE-CAPILLARY: 134 mg/dL — AB (ref 65–99)
Glucose-Capillary: 138 mg/dL — ABNORMAL HIGH (ref 65–99)

## 2017-08-16 DIAGNOSIS — N2581 Secondary hyperparathyroidism of renal origin: Secondary | ICD-10-CM | POA: Diagnosis not present

## 2017-08-16 DIAGNOSIS — E1151 Type 2 diabetes mellitus with diabetic peripheral angiopathy without gangrene: Secondary | ICD-10-CM | POA: Diagnosis not present

## 2017-08-16 DIAGNOSIS — L97516 Non-pressure chronic ulcer of other part of right foot with bone involvement without evidence of necrosis: Secondary | ICD-10-CM | POA: Diagnosis not present

## 2017-08-16 DIAGNOSIS — E11621 Type 2 diabetes mellitus with foot ulcer: Secondary | ICD-10-CM | POA: Diagnosis not present

## 2017-08-16 DIAGNOSIS — M86671 Other chronic osteomyelitis, right ankle and foot: Secondary | ICD-10-CM | POA: Diagnosis not present

## 2017-08-16 DIAGNOSIS — E1169 Type 2 diabetes mellitus with other specified complication: Secondary | ICD-10-CM | POA: Diagnosis not present

## 2017-08-16 DIAGNOSIS — E1142 Type 2 diabetes mellitus with diabetic polyneuropathy: Secondary | ICD-10-CM | POA: Diagnosis not present

## 2017-08-16 DIAGNOSIS — N186 End stage renal disease: Secondary | ICD-10-CM | POA: Diagnosis not present

## 2017-08-16 DIAGNOSIS — M8668 Other chronic osteomyelitis, other site: Secondary | ICD-10-CM | POA: Diagnosis not present

## 2017-08-16 DIAGNOSIS — D631 Anemia in chronic kidney disease: Secondary | ICD-10-CM | POA: Diagnosis not present

## 2017-08-16 DIAGNOSIS — E1129 Type 2 diabetes mellitus with other diabetic kidney complication: Secondary | ICD-10-CM | POA: Diagnosis not present

## 2017-08-16 LAB — GLUCOSE, CAPILLARY
Glucose-Capillary: 122 mg/dL — ABNORMAL HIGH (ref 65–99)
Glucose-Capillary: 138 mg/dL — ABNORMAL HIGH (ref 65–99)
Glucose-Capillary: 233 mg/dL — ABNORMAL HIGH (ref 65–99)

## 2017-08-17 ENCOUNTER — Encounter: Payer: Medicare Other | Admitting: Vascular Surgery

## 2017-08-17 DIAGNOSIS — M8668 Other chronic osteomyelitis, other site: Secondary | ICD-10-CM | POA: Diagnosis not present

## 2017-08-17 DIAGNOSIS — E11621 Type 2 diabetes mellitus with foot ulcer: Secondary | ICD-10-CM | POA: Diagnosis not present

## 2017-08-17 DIAGNOSIS — L97516 Non-pressure chronic ulcer of other part of right foot with bone involvement without evidence of necrosis: Secondary | ICD-10-CM | POA: Diagnosis not present

## 2017-08-17 DIAGNOSIS — E1142 Type 2 diabetes mellitus with diabetic polyneuropathy: Secondary | ICD-10-CM | POA: Diagnosis not present

## 2017-08-17 DIAGNOSIS — E1151 Type 2 diabetes mellitus with diabetic peripheral angiopathy without gangrene: Secondary | ICD-10-CM | POA: Diagnosis not present

## 2017-08-17 DIAGNOSIS — E1169 Type 2 diabetes mellitus with other specified complication: Secondary | ICD-10-CM | POA: Diagnosis not present

## 2017-08-17 DIAGNOSIS — M86671 Other chronic osteomyelitis, right ankle and foot: Secondary | ICD-10-CM | POA: Diagnosis not present

## 2017-08-17 LAB — GLUCOSE, CAPILLARY
GLUCOSE-CAPILLARY: 119 mg/dL — AB (ref 65–99)
Glucose-Capillary: 162 mg/dL — ABNORMAL HIGH (ref 65–99)

## 2017-08-18 DIAGNOSIS — N2581 Secondary hyperparathyroidism of renal origin: Secondary | ICD-10-CM | POA: Diagnosis not present

## 2017-08-18 DIAGNOSIS — E1169 Type 2 diabetes mellitus with other specified complication: Secondary | ICD-10-CM | POA: Diagnosis not present

## 2017-08-18 DIAGNOSIS — E1142 Type 2 diabetes mellitus with diabetic polyneuropathy: Secondary | ICD-10-CM | POA: Diagnosis not present

## 2017-08-18 DIAGNOSIS — E11621 Type 2 diabetes mellitus with foot ulcer: Secondary | ICD-10-CM | POA: Diagnosis not present

## 2017-08-18 DIAGNOSIS — E1151 Type 2 diabetes mellitus with diabetic peripheral angiopathy without gangrene: Secondary | ICD-10-CM | POA: Diagnosis not present

## 2017-08-18 DIAGNOSIS — E1129 Type 2 diabetes mellitus with other diabetic kidney complication: Secondary | ICD-10-CM | POA: Diagnosis not present

## 2017-08-18 DIAGNOSIS — N186 End stage renal disease: Secondary | ICD-10-CM | POA: Diagnosis not present

## 2017-08-18 DIAGNOSIS — M8668 Other chronic osteomyelitis, other site: Secondary | ICD-10-CM | POA: Diagnosis not present

## 2017-08-18 DIAGNOSIS — D631 Anemia in chronic kidney disease: Secondary | ICD-10-CM | POA: Diagnosis not present

## 2017-08-18 DIAGNOSIS — M86671 Other chronic osteomyelitis, right ankle and foot: Secondary | ICD-10-CM | POA: Diagnosis not present

## 2017-08-18 DIAGNOSIS — L97516 Non-pressure chronic ulcer of other part of right foot with bone involvement without evidence of necrosis: Secondary | ICD-10-CM | POA: Diagnosis not present

## 2017-08-18 LAB — GLUCOSE, CAPILLARY
GLUCOSE-CAPILLARY: 117 mg/dL — AB (ref 65–99)
GLUCOSE-CAPILLARY: 152 mg/dL — AB (ref 65–99)
Glucose-Capillary: 199 mg/dL — ABNORMAL HIGH (ref 65–99)

## 2017-08-20 DIAGNOSIS — N2581 Secondary hyperparathyroidism of renal origin: Secondary | ICD-10-CM | POA: Diagnosis not present

## 2017-08-20 DIAGNOSIS — D631 Anemia in chronic kidney disease: Secondary | ICD-10-CM | POA: Diagnosis not present

## 2017-08-20 DIAGNOSIS — N186 End stage renal disease: Secondary | ICD-10-CM | POA: Diagnosis not present

## 2017-08-20 DIAGNOSIS — E1129 Type 2 diabetes mellitus with other diabetic kidney complication: Secondary | ICD-10-CM | POA: Diagnosis not present

## 2017-08-22 DIAGNOSIS — E1151 Type 2 diabetes mellitus with diabetic peripheral angiopathy without gangrene: Secondary | ICD-10-CM | POA: Diagnosis not present

## 2017-08-22 DIAGNOSIS — E11621 Type 2 diabetes mellitus with foot ulcer: Secondary | ICD-10-CM | POA: Diagnosis not present

## 2017-08-22 DIAGNOSIS — M86671 Other chronic osteomyelitis, right ankle and foot: Secondary | ICD-10-CM | POA: Diagnosis not present

## 2017-08-22 DIAGNOSIS — L97516 Non-pressure chronic ulcer of other part of right foot with bone involvement without evidence of necrosis: Secondary | ICD-10-CM | POA: Diagnosis not present

## 2017-08-22 DIAGNOSIS — M8668 Other chronic osteomyelitis, other site: Secondary | ICD-10-CM | POA: Diagnosis not present

## 2017-08-22 DIAGNOSIS — E1169 Type 2 diabetes mellitus with other specified complication: Secondary | ICD-10-CM | POA: Diagnosis not present

## 2017-08-22 DIAGNOSIS — E1142 Type 2 diabetes mellitus with diabetic polyneuropathy: Secondary | ICD-10-CM | POA: Diagnosis not present

## 2017-08-22 LAB — GLUCOSE, CAPILLARY
GLUCOSE-CAPILLARY: 121 mg/dL — AB (ref 65–99)
GLUCOSE-CAPILLARY: 142 mg/dL — AB (ref 65–99)
GLUCOSE-CAPILLARY: 205 mg/dL — AB (ref 65–99)

## 2017-08-23 DIAGNOSIS — M8668 Other chronic osteomyelitis, other site: Secondary | ICD-10-CM | POA: Diagnosis not present

## 2017-08-23 DIAGNOSIS — M86671 Other chronic osteomyelitis, right ankle and foot: Secondary | ICD-10-CM | POA: Diagnosis not present

## 2017-08-23 DIAGNOSIS — E1142 Type 2 diabetes mellitus with diabetic polyneuropathy: Secondary | ICD-10-CM | POA: Diagnosis not present

## 2017-08-23 DIAGNOSIS — E11621 Type 2 diabetes mellitus with foot ulcer: Secondary | ICD-10-CM | POA: Diagnosis not present

## 2017-08-23 DIAGNOSIS — N2581 Secondary hyperparathyroidism of renal origin: Secondary | ICD-10-CM | POA: Diagnosis not present

## 2017-08-23 DIAGNOSIS — E1129 Type 2 diabetes mellitus with other diabetic kidney complication: Secondary | ICD-10-CM | POA: Diagnosis not present

## 2017-08-23 DIAGNOSIS — N186 End stage renal disease: Secondary | ICD-10-CM | POA: Diagnosis not present

## 2017-08-23 DIAGNOSIS — E1169 Type 2 diabetes mellitus with other specified complication: Secondary | ICD-10-CM | POA: Diagnosis not present

## 2017-08-23 DIAGNOSIS — L97516 Non-pressure chronic ulcer of other part of right foot with bone involvement without evidence of necrosis: Secondary | ICD-10-CM | POA: Diagnosis not present

## 2017-08-23 DIAGNOSIS — D631 Anemia in chronic kidney disease: Secondary | ICD-10-CM | POA: Diagnosis not present

## 2017-08-23 DIAGNOSIS — E1151 Type 2 diabetes mellitus with diabetic peripheral angiopathy without gangrene: Secondary | ICD-10-CM | POA: Diagnosis not present

## 2017-08-23 LAB — GLUCOSE, CAPILLARY
GLUCOSE-CAPILLARY: 111 mg/dL — AB (ref 65–99)
GLUCOSE-CAPILLARY: 134 mg/dL — AB (ref 65–99)
Glucose-Capillary: 189 mg/dL — ABNORMAL HIGH (ref 65–99)

## 2017-08-24 DIAGNOSIS — M8668 Other chronic osteomyelitis, other site: Secondary | ICD-10-CM | POA: Diagnosis not present

## 2017-08-24 DIAGNOSIS — E1142 Type 2 diabetes mellitus with diabetic polyneuropathy: Secondary | ICD-10-CM | POA: Diagnosis not present

## 2017-08-24 DIAGNOSIS — E11621 Type 2 diabetes mellitus with foot ulcer: Secondary | ICD-10-CM | POA: Diagnosis not present

## 2017-08-24 DIAGNOSIS — E1169 Type 2 diabetes mellitus with other specified complication: Secondary | ICD-10-CM | POA: Diagnosis not present

## 2017-08-24 DIAGNOSIS — M86671 Other chronic osteomyelitis, right ankle and foot: Secondary | ICD-10-CM | POA: Diagnosis not present

## 2017-08-24 DIAGNOSIS — L97516 Non-pressure chronic ulcer of other part of right foot with bone involvement without evidence of necrosis: Secondary | ICD-10-CM | POA: Diagnosis not present

## 2017-08-24 DIAGNOSIS — E1151 Type 2 diabetes mellitus with diabetic peripheral angiopathy without gangrene: Secondary | ICD-10-CM | POA: Diagnosis not present

## 2017-08-24 LAB — GLUCOSE, CAPILLARY
GLUCOSE-CAPILLARY: 139 mg/dL — AB (ref 65–99)
GLUCOSE-CAPILLARY: 120 mg/dL — AB (ref 65–99)

## 2017-08-25 DIAGNOSIS — D631 Anemia in chronic kidney disease: Secondary | ICD-10-CM | POA: Diagnosis not present

## 2017-08-25 DIAGNOSIS — E1151 Type 2 diabetes mellitus with diabetic peripheral angiopathy without gangrene: Secondary | ICD-10-CM | POA: Diagnosis not present

## 2017-08-25 DIAGNOSIS — L97516 Non-pressure chronic ulcer of other part of right foot with bone involvement without evidence of necrosis: Secondary | ICD-10-CM | POA: Diagnosis not present

## 2017-08-25 DIAGNOSIS — E1169 Type 2 diabetes mellitus with other specified complication: Secondary | ICD-10-CM | POA: Diagnosis not present

## 2017-08-25 DIAGNOSIS — N2581 Secondary hyperparathyroidism of renal origin: Secondary | ICD-10-CM | POA: Diagnosis not present

## 2017-08-25 DIAGNOSIS — N186 End stage renal disease: Secondary | ICD-10-CM | POA: Diagnosis not present

## 2017-08-25 DIAGNOSIS — M86671 Other chronic osteomyelitis, right ankle and foot: Secondary | ICD-10-CM | POA: Diagnosis not present

## 2017-08-25 DIAGNOSIS — M8668 Other chronic osteomyelitis, other site: Secondary | ICD-10-CM | POA: Diagnosis not present

## 2017-08-25 DIAGNOSIS — E1129 Type 2 diabetes mellitus with other diabetic kidney complication: Secondary | ICD-10-CM | POA: Diagnosis not present

## 2017-08-25 DIAGNOSIS — E11621 Type 2 diabetes mellitus with foot ulcer: Secondary | ICD-10-CM | POA: Diagnosis not present

## 2017-08-25 DIAGNOSIS — E1142 Type 2 diabetes mellitus with diabetic polyneuropathy: Secondary | ICD-10-CM | POA: Diagnosis not present

## 2017-08-25 LAB — GLUCOSE, CAPILLARY
Glucose-Capillary: 100 mg/dL — ABNORMAL HIGH (ref 65–99)
Glucose-Capillary: 121 mg/dL — ABNORMAL HIGH (ref 65–99)
Glucose-Capillary: 151 mg/dL — ABNORMAL HIGH (ref 65–99)

## 2017-08-26 DIAGNOSIS — M86671 Other chronic osteomyelitis, right ankle and foot: Secondary | ICD-10-CM | POA: Diagnosis not present

## 2017-08-26 DIAGNOSIS — E1142 Type 2 diabetes mellitus with diabetic polyneuropathy: Secondary | ICD-10-CM | POA: Diagnosis not present

## 2017-08-26 DIAGNOSIS — E11621 Type 2 diabetes mellitus with foot ulcer: Secondary | ICD-10-CM | POA: Diagnosis not present

## 2017-08-26 DIAGNOSIS — E1169 Type 2 diabetes mellitus with other specified complication: Secondary | ICD-10-CM | POA: Diagnosis not present

## 2017-08-26 DIAGNOSIS — E1151 Type 2 diabetes mellitus with diabetic peripheral angiopathy without gangrene: Secondary | ICD-10-CM | POA: Diagnosis not present

## 2017-08-26 DIAGNOSIS — M8668 Other chronic osteomyelitis, other site: Secondary | ICD-10-CM | POA: Diagnosis not present

## 2017-08-26 DIAGNOSIS — L97516 Non-pressure chronic ulcer of other part of right foot with bone involvement without evidence of necrosis: Secondary | ICD-10-CM | POA: Diagnosis not present

## 2017-08-26 LAB — GLUCOSE, CAPILLARY
GLUCOSE-CAPILLARY: 123 mg/dL — AB (ref 65–99)
Glucose-Capillary: 176 mg/dL — ABNORMAL HIGH (ref 65–99)
Glucose-Capillary: 238 mg/dL — ABNORMAL HIGH (ref 65–99)

## 2017-08-27 DIAGNOSIS — E1129 Type 2 diabetes mellitus with other diabetic kidney complication: Secondary | ICD-10-CM | POA: Diagnosis not present

## 2017-08-27 DIAGNOSIS — N186 End stage renal disease: Secondary | ICD-10-CM | POA: Diagnosis not present

## 2017-08-27 DIAGNOSIS — N2581 Secondary hyperparathyroidism of renal origin: Secondary | ICD-10-CM | POA: Diagnosis not present

## 2017-08-27 DIAGNOSIS — D631 Anemia in chronic kidney disease: Secondary | ICD-10-CM | POA: Diagnosis not present

## 2017-08-29 DIAGNOSIS — E1169 Type 2 diabetes mellitus with other specified complication: Secondary | ICD-10-CM | POA: Diagnosis not present

## 2017-08-29 DIAGNOSIS — L97516 Non-pressure chronic ulcer of other part of right foot with bone involvement without evidence of necrosis: Secondary | ICD-10-CM | POA: Diagnosis not present

## 2017-08-29 DIAGNOSIS — M86671 Other chronic osteomyelitis, right ankle and foot: Secondary | ICD-10-CM | POA: Diagnosis not present

## 2017-08-29 DIAGNOSIS — E11621 Type 2 diabetes mellitus with foot ulcer: Secondary | ICD-10-CM | POA: Diagnosis not present

## 2017-08-29 DIAGNOSIS — E1151 Type 2 diabetes mellitus with diabetic peripheral angiopathy without gangrene: Secondary | ICD-10-CM | POA: Diagnosis not present

## 2017-08-29 DIAGNOSIS — M8668 Other chronic osteomyelitis, other site: Secondary | ICD-10-CM | POA: Diagnosis not present

## 2017-08-29 DIAGNOSIS — E1142 Type 2 diabetes mellitus with diabetic polyneuropathy: Secondary | ICD-10-CM | POA: Diagnosis not present

## 2017-08-29 LAB — GLUCOSE, CAPILLARY
GLUCOSE-CAPILLARY: 217 mg/dL — AB (ref 65–99)
Glucose-Capillary: 139 mg/dL — ABNORMAL HIGH (ref 65–99)

## 2017-08-30 DIAGNOSIS — E11621 Type 2 diabetes mellitus with foot ulcer: Secondary | ICD-10-CM | POA: Diagnosis not present

## 2017-08-30 DIAGNOSIS — E1169 Type 2 diabetes mellitus with other specified complication: Secondary | ICD-10-CM | POA: Diagnosis not present

## 2017-08-30 DIAGNOSIS — M8668 Other chronic osteomyelitis, other site: Secondary | ICD-10-CM | POA: Diagnosis not present

## 2017-08-30 DIAGNOSIS — E1142 Type 2 diabetes mellitus with diabetic polyneuropathy: Secondary | ICD-10-CM | POA: Diagnosis not present

## 2017-08-30 DIAGNOSIS — L97516 Non-pressure chronic ulcer of other part of right foot with bone involvement without evidence of necrosis: Secondary | ICD-10-CM | POA: Diagnosis not present

## 2017-08-30 DIAGNOSIS — D631 Anemia in chronic kidney disease: Secondary | ICD-10-CM | POA: Diagnosis not present

## 2017-08-30 DIAGNOSIS — M86671 Other chronic osteomyelitis, right ankle and foot: Secondary | ICD-10-CM | POA: Diagnosis not present

## 2017-08-30 DIAGNOSIS — E1151 Type 2 diabetes mellitus with diabetic peripheral angiopathy without gangrene: Secondary | ICD-10-CM | POA: Diagnosis not present

## 2017-08-30 DIAGNOSIS — N186 End stage renal disease: Secondary | ICD-10-CM | POA: Diagnosis not present

## 2017-08-30 DIAGNOSIS — E1129 Type 2 diabetes mellitus with other diabetic kidney complication: Secondary | ICD-10-CM | POA: Diagnosis not present

## 2017-08-30 DIAGNOSIS — N2581 Secondary hyperparathyroidism of renal origin: Secondary | ICD-10-CM | POA: Diagnosis not present

## 2017-08-30 LAB — GLUCOSE, CAPILLARY
Glucose-Capillary: 120 mg/dL — ABNORMAL HIGH (ref 65–99)
Glucose-Capillary: 143 mg/dL — ABNORMAL HIGH (ref 65–99)
Glucose-Capillary: 207 mg/dL — ABNORMAL HIGH (ref 65–99)

## 2017-09-01 DIAGNOSIS — N186 End stage renal disease: Secondary | ICD-10-CM | POA: Diagnosis not present

## 2017-09-01 DIAGNOSIS — N2581 Secondary hyperparathyroidism of renal origin: Secondary | ICD-10-CM | POA: Diagnosis not present

## 2017-09-01 DIAGNOSIS — E1129 Type 2 diabetes mellitus with other diabetic kidney complication: Secondary | ICD-10-CM | POA: Diagnosis not present

## 2017-09-01 DIAGNOSIS — D631 Anemia in chronic kidney disease: Secondary | ICD-10-CM | POA: Diagnosis not present

## 2017-09-03 DIAGNOSIS — D631 Anemia in chronic kidney disease: Secondary | ICD-10-CM | POA: Diagnosis not present

## 2017-09-03 DIAGNOSIS — E1129 Type 2 diabetes mellitus with other diabetic kidney complication: Secondary | ICD-10-CM | POA: Diagnosis not present

## 2017-09-03 DIAGNOSIS — N2581 Secondary hyperparathyroidism of renal origin: Secondary | ICD-10-CM | POA: Diagnosis not present

## 2017-09-03 DIAGNOSIS — N186 End stage renal disease: Secondary | ICD-10-CM | POA: Diagnosis not present

## 2017-09-06 DIAGNOSIS — D631 Anemia in chronic kidney disease: Secondary | ICD-10-CM | POA: Diagnosis not present

## 2017-09-06 DIAGNOSIS — N186 End stage renal disease: Secondary | ICD-10-CM | POA: Diagnosis not present

## 2017-09-06 DIAGNOSIS — E1129 Type 2 diabetes mellitus with other diabetic kidney complication: Secondary | ICD-10-CM | POA: Diagnosis not present

## 2017-09-06 DIAGNOSIS — N2581 Secondary hyperparathyroidism of renal origin: Secondary | ICD-10-CM | POA: Diagnosis not present

## 2017-09-07 ENCOUNTER — Ambulatory Visit (INDEPENDENT_AMBULATORY_CARE_PROVIDER_SITE_OTHER): Payer: Medicare Other | Admitting: Podiatry

## 2017-09-07 ENCOUNTER — Ambulatory Visit (INDEPENDENT_AMBULATORY_CARE_PROVIDER_SITE_OTHER): Payer: Medicare Other

## 2017-09-07 ENCOUNTER — Telehealth: Payer: Self-pay | Admitting: *Deleted

## 2017-09-07 ENCOUNTER — Encounter: Payer: Self-pay | Admitting: Podiatry

## 2017-09-07 DIAGNOSIS — I739 Peripheral vascular disease, unspecified: Secondary | ICD-10-CM

## 2017-09-07 DIAGNOSIS — M869 Osteomyelitis, unspecified: Secondary | ICD-10-CM | POA: Diagnosis not present

## 2017-09-07 DIAGNOSIS — Q828 Other specified congenital malformations of skin: Secondary | ICD-10-CM

## 2017-09-07 DIAGNOSIS — M79676 Pain in unspecified toe(s): Secondary | ICD-10-CM

## 2017-09-07 DIAGNOSIS — I7025 Atherosclerosis of native arteries of other extremities with ulceration: Secondary | ICD-10-CM | POA: Diagnosis not present

## 2017-09-07 DIAGNOSIS — Z992 Dependence on renal dialysis: Secondary | ICD-10-CM | POA: Diagnosis not present

## 2017-09-07 DIAGNOSIS — B351 Tinea unguium: Secondary | ICD-10-CM

## 2017-09-07 DIAGNOSIS — E1129 Type 2 diabetes mellitus with other diabetic kidney complication: Secondary | ICD-10-CM | POA: Diagnosis not present

## 2017-09-07 DIAGNOSIS — I96 Gangrene, not elsewhere classified: Secondary | ICD-10-CM

## 2017-09-07 DIAGNOSIS — E1151 Type 2 diabetes mellitus with diabetic peripheral angiopathy without gangrene: Secondary | ICD-10-CM | POA: Diagnosis not present

## 2017-09-07 DIAGNOSIS — N186 End stage renal disease: Secondary | ICD-10-CM | POA: Diagnosis not present

## 2017-09-07 DIAGNOSIS — D689 Coagulation defect, unspecified: Secondary | ICD-10-CM | POA: Diagnosis not present

## 2017-09-07 NOTE — Telephone Encounter (Signed)
-----   Message from Vivi Barrack, DPM sent at 09/07/2017 11:22 AM EDT ----- Can you please get her an appointment with [facility] for her? She never followed up after her angio and she likely needs to have the right hallux amputated and wanted her to be seen before that. Thanks.

## 2017-09-08 DIAGNOSIS — D631 Anemia in chronic kidney disease: Secondary | ICD-10-CM | POA: Diagnosis not present

## 2017-09-08 DIAGNOSIS — N2581 Secondary hyperparathyroidism of renal origin: Secondary | ICD-10-CM | POA: Diagnosis not present

## 2017-09-08 DIAGNOSIS — E1129 Type 2 diabetes mellitus with other diabetic kidney complication: Secondary | ICD-10-CM | POA: Diagnosis not present

## 2017-09-08 DIAGNOSIS — N186 End stage renal disease: Secondary | ICD-10-CM | POA: Diagnosis not present

## 2017-09-08 NOTE — Telephone Encounter (Signed)
[name] - [facility] transferred to schedulers, I was instructed to leave a message. Left message [facility] informing consultation appt was needed for pt. Faxed new referral to [facility], clinicals and demographics for consultation.

## 2017-09-08 NOTE — Progress Notes (Signed)
Subjective: Ms. Rhude presents to the office today for 2 concerns.  Her original appointment was made for nail debridement and routine care.  She also states that she been released by the wound care center of the right big toe and was told to follow-up with me.  She states that she has been on antibiotics with infectious disease as I last saw her last she is also had an angiogram with stent placed with vascular surgery.  She has not followed up with vascular surgery since the procedure.  She states that toe continues to deteriorate and was referred for further evaluation.  She has been in the surgical shoe. Denies any systemic complaints such as fevers, chills, nausea, vomiting. No acute changes since last appointment, and no other complaints at this time.   Objective: AAO x3, NAD DP/PT pulses decreased bilaterally Gangrene is present to the right hallux as well no clear drainage expressed.  There is no surrounding erythema, ascending sialitis.  It does appear to be hyperpigmented changes present to the level of the MPJ.  There is no fluctuation or crepitation or any malodor. Hyperkeratotic lesion right fifth toe as well as submetatarsal 5 on the right foot.  No underlying ulceration, drainage or any clinical signs of infection noted today. Nails are very hypertrophic, dystrophic, discolored, elongated x8.  There is subjective irritation in the nails 2 through 5 bilaterally.  No surrounding redness or drainage to the nail sites. No other open lesions or pre-ulcerative lesions are identified. No pain with calf compression, swelling, warmth, erythema  Assessment: Gangrene, osteomyelitis right hallux; symptom onychomycosis  Plan: -All treatment options discussed with the patient including all alternatives, risks, complications.  -Nails are sharply debrided x8 without any complications or bleeding.   Hyperkeratotic lesions were sharply debrided x2 without any complications or bleeding. -Regards to  the right hallux x-rays were obtained and reviewed there is chronic osteomyelitis present the distal phalanx.  There is no soft tissue edema present.  We will get her records from the wound care center.  Also she is to follow-up with vascular surgery regards to her circulation.  Likely perform an amputation, partial first ray resection however like her to see vascular surgery prior to doing this.  Continue to monitor any signs or symptoms of infection to the ER should any occur. -RTC 10 days or sooner if needed.  -Patient encouraged to call the office with any questions, concerns, change in symptoms.   Vivi Barrack DPM

## 2017-09-08 NOTE — Telephone Encounter (Signed)
Done. Can you also please have the wound care center send records? Thanks.

## 2017-09-10 DIAGNOSIS — E1129 Type 2 diabetes mellitus with other diabetic kidney complication: Secondary | ICD-10-CM | POA: Diagnosis not present

## 2017-09-10 DIAGNOSIS — N186 End stage renal disease: Secondary | ICD-10-CM | POA: Diagnosis not present

## 2017-09-10 DIAGNOSIS — N2581 Secondary hyperparathyroidism of renal origin: Secondary | ICD-10-CM | POA: Diagnosis not present

## 2017-09-10 DIAGNOSIS — D631 Anemia in chronic kidney disease: Secondary | ICD-10-CM | POA: Diagnosis not present

## 2017-09-12 ENCOUNTER — Ambulatory Visit: Payer: Medicare Other | Admitting: Podiatry

## 2017-09-13 DIAGNOSIS — N186 End stage renal disease: Secondary | ICD-10-CM | POA: Diagnosis not present

## 2017-09-13 DIAGNOSIS — N2581 Secondary hyperparathyroidism of renal origin: Secondary | ICD-10-CM | POA: Diagnosis not present

## 2017-09-13 DIAGNOSIS — D631 Anemia in chronic kidney disease: Secondary | ICD-10-CM | POA: Diagnosis not present

## 2017-09-13 DIAGNOSIS — E1129 Type 2 diabetes mellitus with other diabetic kidney complication: Secondary | ICD-10-CM | POA: Diagnosis not present

## 2017-09-15 DIAGNOSIS — E1129 Type 2 diabetes mellitus with other diabetic kidney complication: Secondary | ICD-10-CM | POA: Diagnosis not present

## 2017-09-15 DIAGNOSIS — N186 End stage renal disease: Secondary | ICD-10-CM | POA: Diagnosis not present

## 2017-09-15 DIAGNOSIS — D631 Anemia in chronic kidney disease: Secondary | ICD-10-CM | POA: Diagnosis not present

## 2017-09-15 DIAGNOSIS — N2581 Secondary hyperparathyroidism of renal origin: Secondary | ICD-10-CM | POA: Diagnosis not present

## 2017-09-17 DIAGNOSIS — N186 End stage renal disease: Secondary | ICD-10-CM | POA: Diagnosis not present

## 2017-09-17 DIAGNOSIS — N2581 Secondary hyperparathyroidism of renal origin: Secondary | ICD-10-CM | POA: Diagnosis not present

## 2017-09-17 DIAGNOSIS — D631 Anemia in chronic kidney disease: Secondary | ICD-10-CM | POA: Diagnosis not present

## 2017-09-17 DIAGNOSIS — E1129 Type 2 diabetes mellitus with other diabetic kidney complication: Secondary | ICD-10-CM | POA: Diagnosis not present

## 2017-09-19 ENCOUNTER — Encounter: Payer: Self-pay | Admitting: Podiatry

## 2017-09-19 ENCOUNTER — Ambulatory Visit (INDEPENDENT_AMBULATORY_CARE_PROVIDER_SITE_OTHER): Payer: Medicare Other | Admitting: Podiatry

## 2017-09-19 VITALS — Temp 98.2°F

## 2017-09-19 DIAGNOSIS — M869 Osteomyelitis, unspecified: Secondary | ICD-10-CM

## 2017-09-19 DIAGNOSIS — I7025 Atherosclerosis of native arteries of other extremities with ulceration: Secondary | ICD-10-CM

## 2017-09-19 DIAGNOSIS — I96 Gangrene, not elsewhere classified: Secondary | ICD-10-CM | POA: Diagnosis not present

## 2017-09-19 DIAGNOSIS — I739 Peripheral vascular disease, unspecified: Secondary | ICD-10-CM | POA: Diagnosis not present

## 2017-09-19 NOTE — Progress Notes (Signed)
Subjective: 84 year old female presents the office today for follow-up evaluation of gangrene, osteomyelitis to her right hallux.  This is been ongoing now for several months and she is previously seen by the wound care center.  She had a angiogram performed in February and she has seen Dr. [name] with Infectious Disease.  She states that the toe is been starting to smell and has been painful.  She states that when he needs to change the bandages they can see bone. Denies any systemic complaints such as fevers, chills, nausea, vomiting. No acute changes since last appointment, and no other complaints at this time.   Objective: AAO x3, NAD DP/PT pulses decreased  Bilaterally Gangrene is present the right hallux unable to probe to bone.  This is towards the distal dorsal portion of the toe.  There is no fluctuation or crepitation.  Small amount of clear drainage but there is no pus.  There is no ascending cellulitis. There is pain to the toe.   No other open lesions identified today.  No pain with calf compression, swelling, warmth, erythema        Assessment: Gangrene right  Plan: -All treatment options discussed with the patient including all alternatives, risks, complications.  -At this time we discussed both continue with the wound care as well as surgical intervention.  At this point given the malodor I have recommended partial first ray amputation and she wants to go and proceed with this given the pain to her toe.  We discussed risks and complications of surgery including but not limited to delayed or nonhealing and loss of foot or leg.  She has been getting antibiotics at dialysis.  She is scheduled to see Vascular Surgery next week.  -The incision placement as well as the postoperative course was discussed with the patient. I discussed risks of the surgery which include, but not limited to, infection, bleeding, pain, swelling, need for further surgery, delayed or nonhealing, painful or  ugly scar, numbness or sensation changes, over/under correction, recurrence, transfer lesions, further deformity, hardware failure, DVT/PE, loss of toe/foot. Patient understands these risks and wishes to proceed with surgery. The surgical consent was reviewed with the patient all 3 pages were signed. No promises or guarantees were given to the outcome of the procedure. All questions were answered to the best of my ability. Before the surgery the patient was encouraged to call the office if there is any further questions. The surgery will be performed at the [facility] on an outpatient basis.  Vivi Barrack DPM  -Patient encouraged to call the office with any questions, concerns, change in symptoms.

## 2017-09-19 NOTE — Patient Instructions (Signed)

## 2017-09-20 ENCOUNTER — Telehealth: Payer: Self-pay | Admitting: Podiatry

## 2017-09-20 DIAGNOSIS — D631 Anemia in chronic kidney disease: Secondary | ICD-10-CM | POA: Diagnosis not present

## 2017-09-20 DIAGNOSIS — N2581 Secondary hyperparathyroidism of renal origin: Secondary | ICD-10-CM | POA: Diagnosis not present

## 2017-09-20 DIAGNOSIS — E1129 Type 2 diabetes mellitus with other diabetic kidney complication: Secondary | ICD-10-CM | POA: Diagnosis not present

## 2017-09-20 DIAGNOSIS — N186 End stage renal disease: Secondary | ICD-10-CM | POA: Diagnosis not present

## 2017-09-20 NOTE — Telephone Encounter (Signed)
Spoke to the vascular surgeon on call and discussed [name]. Should have adequate healing and proceed with surgery as planned.   Will stress follow-up with [name] that she needs to see vascular post-operatively.

## 2017-09-21 ENCOUNTER — Other Ambulatory Visit: Payer: Self-pay | Admitting: *Deleted

## 2017-09-21 ENCOUNTER — Other Ambulatory Visit: Payer: Self-pay | Admitting: Podiatry

## 2017-09-21 ENCOUNTER — Encounter: Payer: Self-pay | Admitting: Podiatry

## 2017-09-21 DIAGNOSIS — N189 Chronic kidney disease, unspecified: Secondary | ICD-10-CM | POA: Diagnosis not present

## 2017-09-21 DIAGNOSIS — E1152 Type 2 diabetes mellitus with diabetic peripheral angiopathy with gangrene: Secondary | ICD-10-CM | POA: Diagnosis not present

## 2017-09-21 DIAGNOSIS — M86171 Other acute osteomyelitis, right ankle and foot: Secondary | ICD-10-CM | POA: Diagnosis not present

## 2017-09-21 DIAGNOSIS — I96 Gangrene, not elsewhere classified: Secondary | ICD-10-CM

## 2017-09-21 MED ORDER — CLINDAMYCIN HCL 300 MG PO CAPS: 300.0000 mg | ORAL_CAPSULE | Freq: Three times a day (TID) | ORAL | 0 refills | Status: DC

## 2017-09-21 MED ORDER — MEPERIDINE HCL 50 MG PO TABS: 50.0000 mg | ORAL_TABLET | Freq: Four times a day (QID) | ORAL | 0 refills | Status: DC | PRN

## 2017-09-22 ENCOUNTER — Encounter (HOSPITAL_COMMUNITY): Payer: Medicare Other

## 2017-09-22 ENCOUNTER — Telehealth: Payer: Self-pay | Admitting: Vascular Surgery

## 2017-09-22 ENCOUNTER — Telehealth: Payer: Self-pay | Admitting: Podiatry

## 2017-09-22 ENCOUNTER — Other Ambulatory Visit: Payer: Self-pay

## 2017-09-22 ENCOUNTER — Ambulatory Visit: Payer: Medicare Other | Admitting: Podiatry

## 2017-09-22 DIAGNOSIS — E1129 Type 2 diabetes mellitus with other diabetic kidney complication: Secondary | ICD-10-CM | POA: Diagnosis not present

## 2017-09-22 DIAGNOSIS — L98499 Non-pressure chronic ulcer of skin of other sites with unspecified severity: Secondary | ICD-10-CM

## 2017-09-22 DIAGNOSIS — N2581 Secondary hyperparathyroidism of renal origin: Secondary | ICD-10-CM | POA: Diagnosis not present

## 2017-09-22 DIAGNOSIS — D631 Anemia in chronic kidney disease: Secondary | ICD-10-CM | POA: Diagnosis not present

## 2017-09-22 DIAGNOSIS — G8918 Other acute postprocedural pain: Secondary | ICD-10-CM

## 2017-09-22 DIAGNOSIS — I7025 Atherosclerosis of native arteries of other extremities with ulceration: Secondary | ICD-10-CM

## 2017-09-22 DIAGNOSIS — N186 End stage renal disease: Secondary | ICD-10-CM | POA: Diagnosis not present

## 2017-09-22 DIAGNOSIS — I96 Gangrene, not elsewhere classified: Secondary | ICD-10-CM

## 2017-09-22 DIAGNOSIS — I739 Peripheral vascular disease, unspecified: Secondary | ICD-10-CM

## 2017-09-22 MED ORDER — HYDROCODONE-ACETAMINOPHEN 5-325 MG PO TABS: 1.0000 | ORAL_TABLET | ORAL | 0 refills | Status: DC | PRN

## 2017-09-22 NOTE — Telephone Encounter (Signed)
Sched labs 09/26/17 at 2:00 and MD 09/28/17 at 1:30. Lm on daughter's # to inform them of appts.

## 2017-09-22 NOTE — Telephone Encounter (Signed)
Lucile Crater states pt is experiencing throbbing pain. I told [name], that was an indicator the ace wrap is too tight. I told [name] that pt needs not to be up on her foot more than 15 minutes/hour. I told [name] to have pt sit and put surgery foot level with her hip, remove the surgery boot, open-ended sock and ace wrap only, then elevate the surgery foot for 15 minutes but if the pain worsened to dangle 15 minutes this being the only time it was okay to dangle the surgery foot, after 15 minutes place foot level with the hip and rewrap ace looser beginning at the toes and moving up the foot and leg, replace sock and boot. I told [name] pt should be taking the Demerol every 6 hours and on occasion if tolerated could take OTC ibuprofen inbetween doses of the Demerol for added pain relief, and to call with concerns. [name] states understanding.

## 2017-09-22 NOTE — Telephone Encounter (Signed)
I'm calling on behalf of Caeli Linehan. I'm calling because there was an error in the medication as far as what she is allergic to. I know yesterday they had that she's allergic to codeine but she is not allergic to codeine. I just wanted to relay this information to Dr. Ardelle Anton. Also, has been experiencing foot pain today, the day after her surgery. The boot that Dr. Ardelle Anton also gave her, she cannot put her foot all the way down in it for some reason. I've been trying to help. It seems the only method of covering her foot up is the shoe she previously had on that foot, not the boot, but the surgical shoe. If you have any questions you can give me a call back at [phone] or you can call Ms. Panek person number at [phone] or her niece [name] at [phone]. Thank you.

## 2017-09-22 NOTE — Telephone Encounter (Signed)
[name] called states has removed the ace wrap and the gauze is wet with blood. I told [name] to bring pt to the [facility] office for evaluation for dressing change or reinforcement of the dressing. Dr. Ardelle Anton states have Tomasa Hose, RN redress the surgical foot. Orders given to Tomasa Hose, RN.

## 2017-09-22 NOTE — Progress Notes (Signed)
Subjective: [name] is a 84 y.o. is seen today in office s/p right partial 1st ray amputation preformed yesterday 09/21/2017.  She presents the office today as there was concern for bleeding on the bandage.  Patient wanted to loosen the bandage and noticed that there were some blood.  He was trying to loosen the bandage because she has been in a lot of pain overnight.  The pain medicine is not helping.  She did go to dialysis today but she states that has been painful all day so therefore she came into the office to be seen today.  Denies any systemic complaints such as fevers, chills, nausea, vomiting. No calf pain, chest pain, shortness of breath.   Objective: General:  AAOx3 - just coming from dialysis  Neurovascular status unchanged Right foot: Incision is well coapted without any evidence of dehiscence and sutures are intact. There is no surrounding erythema, ascending cellulitis, fluctuance, crepitus, malodor, drainage/purulence. There is mild edema around the surgical site.  There is no evidence of any hematoma.  There is no active bleeding present.  There was some mild bloody drainage only on the gauze bandage as it has not had any strikethrough to the ACE bandage. There is moderate  pain along the surgical site.  No other areas of tenderness to bilateral lower extremities.  No other open lesions or pre-ulcerative lesions.  No pain with calf compression, swelling, warmth, erythema.         Assessment and Plan:  Status post right foot surgery  -Treatment options discussed including all alternatives, risks, and complications -I remove the bandage today and there is only a small amount of bloody drainage on the gauze.  There is no strikethrough to the Ace bandage.  Incision appears to be healing well.  I applied a small amount of Betadine to the area followed by dry sterile dressing. -We will change the pain medicine to Vicodin.  I want her to continue with antibiotics but she does  not take the pain medicine I gave her after surgery and she verbally understood this.  Although she has an allergy to codeine she only reports itching and no significant side effects.  This was with cough medicine.  Discussed Vicodin she is going to try to take this but there is any side effect she is to call the office. -Monitor for any clinical signs or symptoms of infection and DVT/PE and directed to call the office immediately should any occur or go to the ER. -Follow-up on Monday or sooner if any problems arise. In the meantime, encouraged to call the office with any questions, concerns, change in symptoms.   *x-ray next appointment   Ovid Curd, DPM

## 2017-09-22 NOTE — Telephone Encounter (Signed)
-----   Message from Sherren Kerns, MD sent at 09/21/2017  3:36 PM EDT ----- Regarding: RE: urgent appts No keep it urgent in case this doesn't heal ----- Message ----- From: Jena Gauss Sent: 09/21/2017  11:16 AM To: Sherren Kerns, MD Subject: FW: urgent appts                               This pt is having their toe amputated at Dr. Gabriel Rung office today 09/21/17 and then seeing him on 09/23/17 and 09/30/17.  Should we push out these appts?  ----- Message ----- From: Sharee Pimple, RN Sent: 09/21/2017  10:01 AM To: [facility] Admin Pool Subject: urgent appts                                     ----- Message ----- From: Sherren Kerns, MD Sent: 09/21/2017   8:39 AM To: [facility] Charge Pool  This patient needs a duplex of her recent right SFA stent.  Please do right leg duplex and ABIs this week.  She also needs urgent eval either with me on Thursday or with one of my partners this week or next week at the latest.  Dr Imogene Burn probably makes the most sense as he has seen her before.  Fabienne Bruns

## 2017-09-23 ENCOUNTER — Other Ambulatory Visit: Payer: Self-pay

## 2017-09-23 ENCOUNTER — Encounter: Payer: Medicare Other | Admitting: Podiatry

## 2017-09-23 DIAGNOSIS — Z992 Dependence on renal dialysis: Secondary | ICD-10-CM | POA: Diagnosis not present

## 2017-09-23 DIAGNOSIS — N186 End stage renal disease: Secondary | ICD-10-CM | POA: Diagnosis not present

## 2017-09-23 DIAGNOSIS — Z89411 Acquired absence of right great toe: Secondary | ICD-10-CM | POA: Diagnosis not present

## 2017-09-23 DIAGNOSIS — E1151 Type 2 diabetes mellitus with diabetic peripheral angiopathy without gangrene: Secondary | ICD-10-CM | POA: Diagnosis not present

## 2017-09-23 DIAGNOSIS — Z4781 Encounter for orthopedic aftercare following surgical amputation: Secondary | ICD-10-CM | POA: Diagnosis not present

## 2017-09-23 DIAGNOSIS — E1122 Type 2 diabetes mellitus with diabetic chronic kidney disease: Secondary | ICD-10-CM | POA: Diagnosis not present

## 2017-09-24 DIAGNOSIS — E1129 Type 2 diabetes mellitus with other diabetic kidney complication: Secondary | ICD-10-CM | POA: Diagnosis not present

## 2017-09-24 DIAGNOSIS — N186 End stage renal disease: Secondary | ICD-10-CM | POA: Diagnosis not present

## 2017-09-24 DIAGNOSIS — N2581 Secondary hyperparathyroidism of renal origin: Secondary | ICD-10-CM | POA: Diagnosis not present

## 2017-09-24 DIAGNOSIS — D631 Anemia in chronic kidney disease: Secondary | ICD-10-CM | POA: Diagnosis not present

## 2017-09-26 ENCOUNTER — Ambulatory Visit (INDEPENDENT_AMBULATORY_CARE_PROVIDER_SITE_OTHER): Payer: Medicare Other

## 2017-09-26 ENCOUNTER — Telehealth: Payer: Self-pay | Admitting: *Deleted

## 2017-09-26 ENCOUNTER — Ambulatory Visit (INDEPENDENT_AMBULATORY_CARE_PROVIDER_SITE_OTHER)
Admission: RE | Admit: 2017-09-26 | Discharge: 2017-09-26 | Disposition: A | Discharge status: Home / Self Care | Payer: Medicare Other | Source: Ambulatory Visit | Attending: Vascular Surgery | Admitting: Vascular Surgery

## 2017-09-26 ENCOUNTER — Ambulatory Visit (INDEPENDENT_AMBULATORY_CARE_PROVIDER_SITE_OTHER): Payer: Medicare Other | Admitting: Podiatry

## 2017-09-26 ENCOUNTER — Encounter: Payer: Self-pay | Admitting: Podiatry

## 2017-09-26 ENCOUNTER — Ambulatory Visit (HOSPITAL_COMMUNITY)
Admission: RE | Admit: 2017-09-26 | Discharge: 2017-09-26 | Disposition: A | Discharge status: Home / Self Care | Payer: Medicare Other | Source: Ambulatory Visit | Attending: Vascular Surgery | Admitting: Vascular Surgery

## 2017-09-26 VITALS — BP 148/68 | HR 78 | Temp 97.6°F

## 2017-09-26 DIAGNOSIS — Z4781 Encounter for orthopedic aftercare following surgical amputation: Secondary | ICD-10-CM | POA: Diagnosis not present

## 2017-09-26 DIAGNOSIS — M79675 Pain in left toe(s): Secondary | ICD-10-CM

## 2017-09-26 DIAGNOSIS — M869 Osteomyelitis, unspecified: Secondary | ICD-10-CM | POA: Diagnosis not present

## 2017-09-26 DIAGNOSIS — I7025 Atherosclerosis of native arteries of other extremities with ulceration: Secondary | ICD-10-CM | POA: Diagnosis not present

## 2017-09-26 DIAGNOSIS — E1151 Type 2 diabetes mellitus with diabetic peripheral angiopathy without gangrene: Secondary | ICD-10-CM | POA: Diagnosis not present

## 2017-09-26 DIAGNOSIS — E1122 Type 2 diabetes mellitus with diabetic chronic kidney disease: Secondary | ICD-10-CM | POA: Diagnosis not present

## 2017-09-26 DIAGNOSIS — L98499 Non-pressure chronic ulcer of skin of other sites with unspecified severity: Secondary | ICD-10-CM

## 2017-09-26 DIAGNOSIS — Z89411 Acquired absence of right great toe: Secondary | ICD-10-CM | POA: Diagnosis not present

## 2017-09-26 DIAGNOSIS — I739 Peripheral vascular disease, unspecified: Secondary | ICD-10-CM | POA: Diagnosis not present

## 2017-09-26 DIAGNOSIS — Z992 Dependence on renal dialysis: Secondary | ICD-10-CM | POA: Diagnosis not present

## 2017-09-26 DIAGNOSIS — N186 End stage renal disease: Secondary | ICD-10-CM | POA: Diagnosis not present

## 2017-09-26 NOTE — Telephone Encounter (Signed)
Called patient on Friday and could not leave a message due to the voice mail was full. [name]

## 2017-09-27 ENCOUNTER — Telehealth: Payer: Self-pay | Admitting: Podiatry

## 2017-09-27 DIAGNOSIS — N186 End stage renal disease: Secondary | ICD-10-CM | POA: Diagnosis not present

## 2017-09-27 DIAGNOSIS — E1151 Type 2 diabetes mellitus with diabetic peripheral angiopathy without gangrene: Secondary | ICD-10-CM | POA: Diagnosis not present

## 2017-09-27 DIAGNOSIS — D631 Anemia in chronic kidney disease: Secondary | ICD-10-CM | POA: Diagnosis not present

## 2017-09-27 DIAGNOSIS — E1129 Type 2 diabetes mellitus with other diabetic kidney complication: Secondary | ICD-10-CM | POA: Diagnosis not present

## 2017-09-27 DIAGNOSIS — E1122 Type 2 diabetes mellitus with diabetic chronic kidney disease: Secondary | ICD-10-CM | POA: Diagnosis not present

## 2017-09-27 DIAGNOSIS — Z992 Dependence on renal dialysis: Secondary | ICD-10-CM | POA: Diagnosis not present

## 2017-09-27 DIAGNOSIS — Z4781 Encounter for orthopedic aftercare following surgical amputation: Secondary | ICD-10-CM | POA: Diagnosis not present

## 2017-09-27 DIAGNOSIS — Z89411 Acquired absence of right great toe: Secondary | ICD-10-CM | POA: Diagnosis not present

## 2017-09-27 DIAGNOSIS — N2581 Secondary hyperparathyroidism of renal origin: Secondary | ICD-10-CM | POA: Diagnosis not present

## 2017-09-27 NOTE — Telephone Encounter (Signed)
Yes we can Rx OT for patient 2x per week or 3 weeks

## 2017-09-27 NOTE — Telephone Encounter (Signed)
This is Will, Acupuncturist with [facility]. I'm calling to request verbal authorization for occupation therapy two times a week for three weeks. My number is [phone]. Thank you.

## 2017-09-27 NOTE — Progress Notes (Signed)
Subjective: [name] is a 84 y.o. is seen today in office s/p right partial 1st ray amputation preformed yesterday 09/21/2017.  She states that her pain is much improved.  She has been continuing the antibiotics.  She has been wearing a surgical shoe but she states that she cannot wear the walking boot.  She is been trying to help her foot as much as possible.  Overall she feels much better.  However since I last saw her her left fourth toe has been hurting again some swelling to the toe but denies any open sores or redness.  Denies any systemic complaints such as fevers, chills, nausea, vomiting. No calf pain, chest pain, shortness of breath.   Objective: General:  AAOx3 - just coming from dialysis  Neurovascular status unchanged Right foot: Incision is coapted without any evidence of dehiscence and sutures are intact. There is no surrounding erythema, ascending cellulitis, fluctuance, crepitus, malodor, drainage/purulence. There is mild edema around the surgical site which continues.  There is no evidence of any hematoma or fluid collection.  There is no drainage on the bandage today.  Decreased tenderness palpation the surgical site.  There is swelling to the left fourth toe with tenderness palpation mostly towards the DIPJ.  There is no pain in the metatarsal other areas of the left foot. No pain with calf compression, swelling, warmth, erythema.   Assessment and Plan:  Status post right foot surgery  -Treatment options discussed including all alternatives, risks, and complications -X-rays were obtained and reviewed.  The right foot status post partial first ray amputation.  Left foot there is no evidence of acute fracture but there is hammertoe deformity present.  Arthritic changes present. -In regards to the right foot the area was cleaned and Betadine was painted over the incision followed by dry sterile dressing.  Keep the dressing clean, dry, intact.  Continue recommend to limit  weightbearing until with surgical shoe at all times.  I like for her to wear the boot to get more protection but she states the pain hurts.  She has a follow-up appointment on Wednesday with vascular surgery as well as arterial studies today and I stressed the importance of keeping these appointments. Monitor for any clinical signs or symptoms of infection and directed to call the office immediately should any occur or go to the ER. -In regards to the left fourth toe offloading pads were dispensed. -RTC Friday for dressing change.   Vivi Barrack DPM

## 2017-09-28 ENCOUNTER — Encounter: Payer: Self-pay | Admitting: Vascular Surgery

## 2017-09-28 ENCOUNTER — Ambulatory Visit (INDEPENDENT_AMBULATORY_CARE_PROVIDER_SITE_OTHER): Payer: Medicare Other | Admitting: Vascular Surgery

## 2017-09-28 ENCOUNTER — Telehealth: Payer: Self-pay | Admitting: Podiatry

## 2017-09-28 ENCOUNTER — Other Ambulatory Visit: Payer: Self-pay

## 2017-09-28 VITALS — BP 164/70 | HR 77 | Temp 98.4°F | Resp 20 | Ht 63.0 in | Wt 193.0 lb

## 2017-09-28 DIAGNOSIS — I7025 Atherosclerosis of native arteries of other extremities with ulceration: Secondary | ICD-10-CM | POA: Diagnosis not present

## 2017-09-28 DIAGNOSIS — E1122 Type 2 diabetes mellitus with diabetic chronic kidney disease: Secondary | ICD-10-CM | POA: Diagnosis not present

## 2017-09-28 DIAGNOSIS — E1151 Type 2 diabetes mellitus with diabetic peripheral angiopathy without gangrene: Secondary | ICD-10-CM | POA: Diagnosis not present

## 2017-09-28 DIAGNOSIS — N186 End stage renal disease: Secondary | ICD-10-CM | POA: Diagnosis not present

## 2017-09-28 DIAGNOSIS — Z89411 Acquired absence of right great toe: Secondary | ICD-10-CM | POA: Diagnosis not present

## 2017-09-28 DIAGNOSIS — Z4781 Encounter for orthopedic aftercare following surgical amputation: Secondary | ICD-10-CM | POA: Diagnosis not present

## 2017-09-28 DIAGNOSIS — Z992 Dependence on renal dialysis: Secondary | ICD-10-CM | POA: Diagnosis not present

## 2017-09-28 MED ORDER — NITROGLYCERIN 0.1 MG/HR TD PT24: MEDICATED_PATCH | TRANSDERMAL | 1 refills | Status: DC

## 2017-09-28 NOTE — Progress Notes (Signed)
Established Critical Limb Ischemia Patient   History of Present Illness   [name] is a 84 y.o. (Oct 03, 1932) female who presents with chief complaint: R foot toe amp.  Prior procedures include: 1. Partial R 1st ray partial amputation by Podiatry for gangrene(Dr. Ardelle Anton) 2. PTA R pop, PTA+S R SFA (06/24/17) by Dr. Darrick Penna 3. Ao, BRo (07/05/16)   Pt was lost to follow up from her procedure on 06/24/17.  The patient had R great toe ulcer at that time.  She reported had progression and required R 1st ray amputation this last month.  The patient has no rest pain.  Pt notes still some post-op pain in R foot.  She denies any fever or chills or drainage from the R foot.  The patient's treatment regimen currently included: maximal medical management and Podiatric care.  She has follow up with Dr. Joya Gaskins this Friday..  The patient's PMH, PSH, SH, and FamHx were reviewed on 09/26/17 are unchanged from 06/24/17.  Current Outpatient Medications  Medication Sig Dispense Refill  . acetaminophen (TYLENOL) 500 MG tablet Take 1,000 mg by mouth every 6 (six) hours as needed for mild pain or headache.     Marland Kitchen aspirin 81 MG tablet Take 81 mg by mouth daily.     . B Complex-C-Folic Acid (RENAL) 1 MG CAPS Take 1 capsule by mouth daily.  3  . clindamycin (CLEOCIN) 300 MG capsule Take 1 capsule (300 mg total) by mouth 3 (three) times daily. 30 capsule 0  . clopidogrel (PLAVIX) 75 MG tablet Take 1 tablet (75 mg total) by mouth daily. 30 tablet 11  . Doxercalciferol (HECTOROL) 2 MCG/ML SOLN Inject 3 mcg into the vein 3 (three) times a week.    . gabapentin (NEURONTIN) 100 MG capsule Take 100 mg by mouth at bedtime.     Marland Kitchen HYDROcodone-acetaminophen (NORCO/VICODIN) 5-325 MG tablet Take 1 tablet by mouth every 4 (four) hours as needed. 20 tablet 0  . IRON SUCROSE IV Inject 1 Dose into the vein once a week.    . meperidine (DEMEROL) 50 MG tablet Take 1 tablet (50 mg total) by mouth every 6 (six) hours as needed.  10 tablet 0  . Methoxy PEG-Epoetin Beta (MIRCERA) 75 MCG/0.3ML SOSY Inject 75 mcg as directed every 14 (fourteen) days.    . metroNIDAZOLE (FLAGYL) 500 MG tablet Take 1 tablet (500 mg total) by mouth 2 (two) times daily. 60 tablet 1  . mupirocin ointment (BACTROBAN) 2 % Apply 1 application topically 2 (two) times daily. 30 g 2  . oxyCODONE-acetaminophen (PERCOCET/ROXICET) 5-325 MG tablet Take 1 tablet by mouth every 6 (six) hours as needed. (Patient not taking: Reported on 07/11/2017) 10 tablet 0  . RENVELA 800 MG tablet Take 1,600-2,400 mg by mouth See admin instructions. Take 2400 mg by mouth 3 times daily with meals and take 1600 mg by mouth with snacks    . SENSIPAR 30 MG tablet Take 30 mg by mouth every evening.     . silver sulfADIAZINE (SILVADENE) 1 % cream Apply 1 application topically daily. (Patient not taking: Reported on 07/11/2017) 50 g 0   Current Facility-Administered Medications  Medication Dose Route Frequency Provider Last Rate Last Dose  . protamine injection 25 mg  25 mg Intravenous Once Sherren Kerns, MD        On ROS today: no fever or chills, no drainage from right foot   Physical Examination   Vitals:   09/28/17 1401 09/28/17 1403  BP: (!) 158/73 (!) 164/70  Pulse: 77   Resp: 20   Temp: 98.4 F (36.9 C)   TempSrc: Oral   SpO2: 98%   Weight: 193 lb (87.5 kg)   Height:  (1.6 m)    Body mass index is 34.19 kg/m.  General Alert, O x 3, WD, NAD, wheel chair bound  Pulmonary Sym exp, good B air movt, CTA B  Cardiac RRR, Nl S1, S2, no Murmurs, No rubs, No S3,S4  Vascular Vessel Right Left  Radial Palpable Palpable  Brachial Palpable Palpable  Carotid Palpable, No Bruit Palpable, No Bruit  Aorta Not palpable N/A  Femoral Palpable Palpable  Popliteal Not palpable Not palpable  PT Not palpable Not palpable  DP Not palpable Not palpable    Gastro- intestinal soft, non-distended, non-tender to palpation, No guarding or rebound, no HSM, no masses, no  CVAT B, No palpable prominent aortic pulse,    Musculo- skeletal M/S 5/5 throughout  , Extremities without ischemic changes except R first ray amputation: minimal serous drainage from distal incision, somewhat ischemic skin distally, sutures in place , No edema present, No visible varicosities , No Lipodermatosclerosis present, dopplerable DP signal adjacent to suture line  Neurologic Pain and light touch intact in extremities except for decreased sensation in B feet, Motor exam as listed above    Medical Decision Making   [name] is a 84 y.o. female who presents with: s/p R SFA PTA+S, R BK pop PTA, R great toe gangrene s/p R great toe partial ray amputation   BRo in Feb 2019 demonstrates nothing but collaterals into each foot from peroneal artery.  Unfortunately, no further revascularization options at this point.    Dopplerable DP suggest some flow into area of surgical incision.  Gave patient Nitroglycerin 0.1 mg/hr TD to R foot to see if any additional capillary recruitment possible with nitroglycerin patch.  If R foot fails to heal, pt will need to consider R BKA.  Will have pt follow up in 2-4 weeks to re-evaluate the R foot.  Thank you for allowing [place] to participate in this patient's care.   Leonides Sake, MD, FACS [facility] Office: [phone] Pager: [phone]

## 2017-09-28 NOTE — Telephone Encounter (Signed)
This Betsy, PT with [facility]. I'm calling to get verbal orders to continue physical therapy services 1 time a week for 1 week and then 2 times a week for 3 weeks. You can reach me at [phone]. Thank you.

## 2017-09-28 NOTE — Telephone Encounter (Signed)
Left message informing Tamela Oddi, PT - [facility] Dr. Bary Castilla recommended PT schedule.

## 2017-09-28 NOTE — Telephone Encounter (Signed)
I informed Launa Grill, OT - [facility] of Dr. Wynema Birch orders.

## 2017-09-29 DIAGNOSIS — D631 Anemia in chronic kidney disease: Secondary | ICD-10-CM | POA: Diagnosis not present

## 2017-09-29 DIAGNOSIS — E1129 Type 2 diabetes mellitus with other diabetic kidney complication: Secondary | ICD-10-CM | POA: Diagnosis not present

## 2017-09-29 DIAGNOSIS — N2581 Secondary hyperparathyroidism of renal origin: Secondary | ICD-10-CM | POA: Diagnosis not present

## 2017-09-29 DIAGNOSIS — Z4781 Encounter for orthopedic aftercare following surgical amputation: Secondary | ICD-10-CM | POA: Diagnosis not present

## 2017-09-29 DIAGNOSIS — Z89411 Acquired absence of right great toe: Secondary | ICD-10-CM | POA: Diagnosis not present

## 2017-09-29 DIAGNOSIS — E1151 Type 2 diabetes mellitus with diabetic peripheral angiopathy without gangrene: Secondary | ICD-10-CM | POA: Diagnosis not present

## 2017-09-29 DIAGNOSIS — Z992 Dependence on renal dialysis: Secondary | ICD-10-CM | POA: Diagnosis not present

## 2017-09-29 DIAGNOSIS — N186 End stage renal disease: Secondary | ICD-10-CM | POA: Diagnosis not present

## 2017-09-29 DIAGNOSIS — E1122 Type 2 diabetes mellitus with diabetic chronic kidney disease: Secondary | ICD-10-CM | POA: Diagnosis not present

## 2017-09-30 ENCOUNTER — Encounter: Payer: Medicare Other | Admitting: Podiatry

## 2017-09-30 ENCOUNTER — Ambulatory Visit (INDEPENDENT_AMBULATORY_CARE_PROVIDER_SITE_OTHER): Payer: Self-pay | Admitting: Podiatry

## 2017-09-30 ENCOUNTER — Telehealth: Payer: Self-pay | Admitting: Podiatry

## 2017-09-30 DIAGNOSIS — Z9889 Other specified postprocedural states: Secondary | ICD-10-CM

## 2017-09-30 DIAGNOSIS — N186 End stage renal disease: Secondary | ICD-10-CM | POA: Diagnosis not present

## 2017-09-30 DIAGNOSIS — M869 Osteomyelitis, unspecified: Secondary | ICD-10-CM

## 2017-09-30 DIAGNOSIS — Z4781 Encounter for orthopedic aftercare following surgical amputation: Secondary | ICD-10-CM | POA: Diagnosis not present

## 2017-09-30 DIAGNOSIS — Z992 Dependence on renal dialysis: Secondary | ICD-10-CM | POA: Diagnosis not present

## 2017-09-30 DIAGNOSIS — I96 Gangrene, not elsewhere classified: Secondary | ICD-10-CM

## 2017-09-30 DIAGNOSIS — E1151 Type 2 diabetes mellitus with diabetic peripheral angiopathy without gangrene: Secondary | ICD-10-CM | POA: Diagnosis not present

## 2017-09-30 DIAGNOSIS — Z89411 Acquired absence of right great toe: Secondary | ICD-10-CM | POA: Diagnosis not present

## 2017-09-30 DIAGNOSIS — E1122 Type 2 diabetes mellitus with diabetic chronic kidney disease: Secondary | ICD-10-CM | POA: Diagnosis not present

## 2017-09-30 NOTE — Telephone Encounter (Signed)
This is Tracy,RN with [facility]. I'm out with the pt right now and she states she came and was seen by Dr. Ardelle Anton today. We are out for wound care and assessment. The last orders I have from 17 May says its awaiting orders from you guys and we have no orders from you guys to perform wound care. I was calling just to see what exactly you all wanted [place] to do with wound care for the pt. If you would please call me back at [phone] or you can fax the orders to [phone]. Thank you.

## 2017-09-30 NOTE — Telephone Encounter (Signed)
I informed French Ana - [facility] we did not have new wound care orders at this time continue current orders and I would inform Dr. Ardelle Anton of request and call again Tuesday.

## 2017-10-01 DIAGNOSIS — N2581 Secondary hyperparathyroidism of renal origin: Secondary | ICD-10-CM | POA: Diagnosis not present

## 2017-10-01 DIAGNOSIS — E1129 Type 2 diabetes mellitus with other diabetic kidney complication: Secondary | ICD-10-CM | POA: Diagnosis not present

## 2017-10-01 DIAGNOSIS — D631 Anemia in chronic kidney disease: Secondary | ICD-10-CM | POA: Diagnosis not present

## 2017-10-01 DIAGNOSIS — N186 End stage renal disease: Secondary | ICD-10-CM | POA: Diagnosis not present

## 2017-10-04 DIAGNOSIS — E1122 Type 2 diabetes mellitus with diabetic chronic kidney disease: Secondary | ICD-10-CM | POA: Diagnosis not present

## 2017-10-04 DIAGNOSIS — E1129 Type 2 diabetes mellitus with other diabetic kidney complication: Secondary | ICD-10-CM | POA: Diagnosis not present

## 2017-10-04 DIAGNOSIS — Z4781 Encounter for orthopedic aftercare following surgical amputation: Secondary | ICD-10-CM | POA: Diagnosis not present

## 2017-10-04 DIAGNOSIS — E1151 Type 2 diabetes mellitus with diabetic peripheral angiopathy without gangrene: Secondary | ICD-10-CM | POA: Diagnosis not present

## 2017-10-04 DIAGNOSIS — Z89411 Acquired absence of right great toe: Secondary | ICD-10-CM | POA: Diagnosis not present

## 2017-10-04 DIAGNOSIS — D631 Anemia in chronic kidney disease: Secondary | ICD-10-CM | POA: Diagnosis not present

## 2017-10-04 DIAGNOSIS — N2581 Secondary hyperparathyroidism of renal origin: Secondary | ICD-10-CM | POA: Diagnosis not present

## 2017-10-04 DIAGNOSIS — Z992 Dependence on renal dialysis: Secondary | ICD-10-CM | POA: Diagnosis not present

## 2017-10-04 DIAGNOSIS — N186 End stage renal disease: Secondary | ICD-10-CM | POA: Diagnosis not present

## 2017-10-04 NOTE — Telephone Encounter (Signed)
If they are coming please have them do betadine on the incision followed by a dry sterile dressing. Thanks.

## 2017-10-04 NOTE — Telephone Encounter (Signed)
I informed French Ana, Nurse of Dr. Gabriel Rung 10/04/2017 6:58am orders and she states they go out 2 x week and the niece changes the dressing in between.

## 2017-10-04 NOTE — Progress Notes (Signed)
Subjective: [name] is a 84 y.o. is seen today in office s/p right partial 1st ray amputation preformed yesterday 09/21/2017.  Overall she feels well her pain is much improved.  She has been in surgical shoe.  She presents today for dressing change.  She has no new concerns today.  Denies any systemic complaints such as fevers, chills, nausea, vomiting. No calf pain, chest pain, shortness of breath.   Objective: General:  AAOx3 - just coming from dialysis  Neurovascular status unchanged Right foot: Incision is coapted and sutures are intact.  Mild opening on the distal portion of the incision however this is not worsened.  The proximal muscle appears to be healing well.  Stoma clear drainage from the incision but there is no pus.  There is no surrounding erythema, ascending sialitis.  There is no fluctuation or crepitation or malodor.   No pain with calf compression, swelling, warmth, erythema.   Assessment and Plan:  Status post right foot surgery  -Treatment options discussed including all alternatives, risks, and complications -Incision appears to be doing well.  Mild opening on the distal portion however there is no probing this appears to be superficial dehiscence although minimal.  Recommend a small amount of Betadine to the incision daily followed by dry sterile dressing.  Continue with surgical shoe and offloading all times.  Finish course of antibiotics. -Monitor for any clinical signs or symptoms of infection and directed to call the office immediately should any occur or go to the ER. -RTC 1 week or sooner if needed  Vivi Barrack DPM

## 2017-10-05 ENCOUNTER — Other Ambulatory Visit: Payer: Self-pay

## 2017-10-05 DIAGNOSIS — Z992 Dependence on renal dialysis: Secondary | ICD-10-CM | POA: Diagnosis not present

## 2017-10-05 DIAGNOSIS — I739 Peripheral vascular disease, unspecified: Secondary | ICD-10-CM

## 2017-10-05 DIAGNOSIS — Z4781 Encounter for orthopedic aftercare following surgical amputation: Secondary | ICD-10-CM | POA: Diagnosis not present

## 2017-10-05 DIAGNOSIS — L98499 Non-pressure chronic ulcer of skin of other sites with unspecified severity: Secondary | ICD-10-CM

## 2017-10-05 DIAGNOSIS — E1151 Type 2 diabetes mellitus with diabetic peripheral angiopathy without gangrene: Secondary | ICD-10-CM | POA: Diagnosis not present

## 2017-10-05 DIAGNOSIS — E1122 Type 2 diabetes mellitus with diabetic chronic kidney disease: Secondary | ICD-10-CM | POA: Diagnosis not present

## 2017-10-05 DIAGNOSIS — Z89411 Acquired absence of right great toe: Secondary | ICD-10-CM | POA: Diagnosis not present

## 2017-10-05 DIAGNOSIS — I7025 Atherosclerosis of native arteries of other extremities with ulceration: Secondary | ICD-10-CM

## 2017-10-05 DIAGNOSIS — N186 End stage renal disease: Secondary | ICD-10-CM | POA: Diagnosis not present

## 2017-10-06 DIAGNOSIS — N2581 Secondary hyperparathyroidism of renal origin: Secondary | ICD-10-CM | POA: Diagnosis not present

## 2017-10-06 DIAGNOSIS — E1129 Type 2 diabetes mellitus with other diabetic kidney complication: Secondary | ICD-10-CM | POA: Diagnosis not present

## 2017-10-06 DIAGNOSIS — N186 End stage renal disease: Secondary | ICD-10-CM | POA: Diagnosis not present

## 2017-10-06 DIAGNOSIS — D631 Anemia in chronic kidney disease: Secondary | ICD-10-CM | POA: Diagnosis not present

## 2017-10-07 ENCOUNTER — Encounter: Payer: Self-pay | Admitting: Podiatry

## 2017-10-07 ENCOUNTER — Ambulatory Visit (INDEPENDENT_AMBULATORY_CARE_PROVIDER_SITE_OTHER): Payer: Medicare Other | Admitting: Podiatry

## 2017-10-07 VITALS — Temp 97.8°F

## 2017-10-07 DIAGNOSIS — T8130XA Disruption of wound, unspecified, initial encounter: Secondary | ICD-10-CM

## 2017-10-07 DIAGNOSIS — Z992 Dependence on renal dialysis: Secondary | ICD-10-CM | POA: Diagnosis not present

## 2017-10-07 DIAGNOSIS — E1122 Type 2 diabetes mellitus with diabetic chronic kidney disease: Secondary | ICD-10-CM | POA: Diagnosis not present

## 2017-10-07 DIAGNOSIS — Z4781 Encounter for orthopedic aftercare following surgical amputation: Secondary | ICD-10-CM | POA: Diagnosis not present

## 2017-10-07 DIAGNOSIS — E1151 Type 2 diabetes mellitus with diabetic peripheral angiopathy without gangrene: Secondary | ICD-10-CM | POA: Diagnosis not present

## 2017-10-07 DIAGNOSIS — Z9889 Other specified postprocedural states: Secondary | ICD-10-CM

## 2017-10-07 DIAGNOSIS — Z89411 Acquired absence of right great toe: Secondary | ICD-10-CM | POA: Diagnosis not present

## 2017-10-07 DIAGNOSIS — I96 Gangrene, not elsewhere classified: Secondary | ICD-10-CM

## 2017-10-07 DIAGNOSIS — N186 End stage renal disease: Secondary | ICD-10-CM | POA: Diagnosis not present

## 2017-10-07 MED ORDER — COLLAGENASE 250 UNIT/GM EX OINT: 1.0000 "application " | TOPICAL_OINTMENT | Freq: Every day | CUTANEOUS | 5 refills | Status: DC

## 2017-10-08 DIAGNOSIS — Z4781 Encounter for orthopedic aftercare following surgical amputation: Secondary | ICD-10-CM | POA: Diagnosis not present

## 2017-10-08 DIAGNOSIS — E1151 Type 2 diabetes mellitus with diabetic peripheral angiopathy without gangrene: Secondary | ICD-10-CM | POA: Diagnosis not present

## 2017-10-08 DIAGNOSIS — N186 End stage renal disease: Secondary | ICD-10-CM | POA: Diagnosis not present

## 2017-10-08 DIAGNOSIS — D631 Anemia in chronic kidney disease: Secondary | ICD-10-CM | POA: Diagnosis not present

## 2017-10-08 DIAGNOSIS — Z992 Dependence on renal dialysis: Secondary | ICD-10-CM | POA: Diagnosis not present

## 2017-10-08 DIAGNOSIS — A499 Bacterial infection, unspecified: Secondary | ICD-10-CM | POA: Diagnosis not present

## 2017-10-08 DIAGNOSIS — E1122 Type 2 diabetes mellitus with diabetic chronic kidney disease: Secondary | ICD-10-CM | POA: Diagnosis not present

## 2017-10-08 DIAGNOSIS — E1129 Type 2 diabetes mellitus with other diabetic kidney complication: Secondary | ICD-10-CM | POA: Diagnosis not present

## 2017-10-08 DIAGNOSIS — Z89411 Acquired absence of right great toe: Secondary | ICD-10-CM | POA: Diagnosis not present

## 2017-10-08 DIAGNOSIS — N2581 Secondary hyperparathyroidism of renal origin: Secondary | ICD-10-CM | POA: Diagnosis not present

## 2017-10-10 ENCOUNTER — Telehealth: Payer: Self-pay | Admitting: Podiatry

## 2017-10-10 DIAGNOSIS — Z4781 Encounter for orthopedic aftercare following surgical amputation: Secondary | ICD-10-CM | POA: Diagnosis not present

## 2017-10-10 DIAGNOSIS — E1151 Type 2 diabetes mellitus with diabetic peripheral angiopathy without gangrene: Secondary | ICD-10-CM | POA: Diagnosis not present

## 2017-10-10 DIAGNOSIS — N186 End stage renal disease: Secondary | ICD-10-CM | POA: Diagnosis not present

## 2017-10-10 DIAGNOSIS — Z89411 Acquired absence of right great toe: Secondary | ICD-10-CM | POA: Diagnosis not present

## 2017-10-10 DIAGNOSIS — Z992 Dependence on renal dialysis: Secondary | ICD-10-CM | POA: Diagnosis not present

## 2017-10-10 DIAGNOSIS — E1122 Type 2 diabetes mellitus with diabetic chronic kidney disease: Secondary | ICD-10-CM | POA: Diagnosis not present

## 2017-10-10 MED ORDER — HYDROCODONE-ACETAMINOPHEN 5-325 MG PO TABS: 1.0000 | ORAL_TABLET | Freq: Four times a day (QID) | ORAL | 0 refills | Status: DC | PRN

## 2017-10-10 NOTE — Progress Notes (Signed)
Subjective: [name] is a 84 y.o. is seen today in office s/p right partial 1st ray amputation preformed yesterday 09/21/2017.  She presents today for follow-up evaluation of the wound.  She is try to remain nonweightbearing which is possible she is been wearing a surgical shoe.  She has no pain.  Family member change the bandages for her she also has a nurse that comes to her house.   Denies any systemic complaints such as fevers, chills, nausea, vomiting. No calf pain, chest pain, shortness of breath.   Objective: General:  AAOx3 - just coming from dialysis  Neurovascular status unchanged Right foot: Incision is coapted and sutures are intact.  There is increased dehiscence along the central aspect of the wound today.  There is fibrofatty tissue present within the wound bed.  There is no surrounding erythema, ascending sialitis.  Is no fluctuation or crepitation or any malodor.  There is no drainage. No pain with calf compression, swelling, warmth, erythema.       Assessment and Plan:  Status post right foot surgery with wound dehiscence  -Treatment options discussed including all alternatives, risks, and complications -At this time the wound is continuing to open. Social Santyl dressing changes daily which was ordered for her.  Also we have discussed other treatment options.  I recommended her to go back to the wound care center for evaluation and possible hyperbaric oxygen therapy.  Unfortunately she does not have any good revascularization options for the right leg. -Monitor for any clinical signs or symptoms of infection and directed to call the office immediately should any occur or go to the ER. -RTC 1 week or sooner if needed  Vivi BarrackMatthew R Dulcinea Kinser DPM

## 2017-10-10 NOTE — Addendum Note (Signed)
Addended by: Alphia Kava'CONNELL, VALERY D on: 10/10/2017 04:37 PM   Modules accepted: Orders

## 2017-10-10 NOTE — Telephone Encounter (Signed)
This is [name] calling on behalf of [name]. She is needing a refill of the last pain medication that Dr. Ardelle AntonWagoner prescribed. She is out of it and needs more. She can be reached at cell phone number [phone], or [phone] or if that doesn't work [phone]. Please give me a call back at your earliest convenience. She is in pain and needs that medication refilled. Thank you.

## 2017-10-10 NOTE — Telephone Encounter (Signed)
I informed Mr. [name] he could pick up the rx in the [place] office.

## 2017-10-11 ENCOUNTER — Encounter (HOSPITAL_BASED_OUTPATIENT_CLINIC_OR_DEPARTMENT_OTHER): Payer: Medicare Other | Attending: Internal Medicine

## 2017-10-11 ENCOUNTER — Other Ambulatory Visit (HOSPITAL_COMMUNITY)
Admit: 2017-10-11 | Discharge: 2017-10-11 | Disposition: A | Discharge status: Home / Self Care | Payer: Medicare Other | Source: Ambulatory Visit | Attending: Internal Medicine | Admitting: Internal Medicine

## 2017-10-11 DIAGNOSIS — E1129 Type 2 diabetes mellitus with other diabetic kidney complication: Secondary | ICD-10-CM | POA: Diagnosis not present

## 2017-10-11 DIAGNOSIS — E11621 Type 2 diabetes mellitus with foot ulcer: Secondary | ICD-10-CM | POA: Insufficient documentation

## 2017-10-11 DIAGNOSIS — E1140 Type 2 diabetes mellitus with diabetic neuropathy, unspecified: Secondary | ICD-10-CM | POA: Diagnosis not present

## 2017-10-11 DIAGNOSIS — N186 End stage renal disease: Secondary | ICD-10-CM | POA: Diagnosis not present

## 2017-10-11 DIAGNOSIS — Z89411 Acquired absence of right great toe: Secondary | ICD-10-CM | POA: Diagnosis not present

## 2017-10-11 DIAGNOSIS — I120 Hypertensive chronic kidney disease with stage 5 chronic kidney disease or end stage renal disease: Secondary | ICD-10-CM | POA: Diagnosis not present

## 2017-10-11 DIAGNOSIS — T8781 Dehiscence of amputation stump: Secondary | ICD-10-CM | POA: Diagnosis not present

## 2017-10-11 DIAGNOSIS — B999 Unspecified infectious disease: Secondary | ICD-10-CM | POA: Diagnosis not present

## 2017-10-11 DIAGNOSIS — T8132XA Disruption of internal operation (surgical) wound, not elsewhere classified, initial encounter: Secondary | ICD-10-CM | POA: Diagnosis not present

## 2017-10-11 DIAGNOSIS — Y835 Amputation of limb(s) as the cause of abnormal reaction of the patient, or of later complication, without mention of misadventure at the time of the procedure: Secondary | ICD-10-CM | POA: Diagnosis not present

## 2017-10-11 DIAGNOSIS — D631 Anemia in chronic kidney disease: Secondary | ICD-10-CM | POA: Diagnosis not present

## 2017-10-11 DIAGNOSIS — Z992 Dependence on renal dialysis: Secondary | ICD-10-CM | POA: Insufficient documentation

## 2017-10-11 DIAGNOSIS — E1122 Type 2 diabetes mellitus with diabetic chronic kidney disease: Secondary | ICD-10-CM | POA: Diagnosis not present

## 2017-10-11 DIAGNOSIS — N2581 Secondary hyperparathyroidism of renal origin: Secondary | ICD-10-CM | POA: Diagnosis not present

## 2017-10-11 DIAGNOSIS — B9562 Methicillin resistant Staphylococcus aureus infection as the cause of diseases classified elsewhere: Secondary | ICD-10-CM | POA: Insufficient documentation

## 2017-10-11 DIAGNOSIS — L97512 Non-pressure chronic ulcer of other part of right foot with fat layer exposed: Secondary | ICD-10-CM | POA: Insufficient documentation

## 2017-10-11 DIAGNOSIS — E1151 Type 2 diabetes mellitus with diabetic peripheral angiopathy without gangrene: Secondary | ICD-10-CM | POA: Insufficient documentation

## 2017-10-11 DIAGNOSIS — B9620 Unspecified Escherichia coli [E. coli] as the cause of diseases classified elsewhere: Secondary | ICD-10-CM | POA: Diagnosis not present

## 2017-10-11 DIAGNOSIS — A499 Bacterial infection, unspecified: Secondary | ICD-10-CM | POA: Diagnosis not present

## 2017-10-12 DIAGNOSIS — Z4781 Encounter for orthopedic aftercare following surgical amputation: Secondary | ICD-10-CM | POA: Diagnosis not present

## 2017-10-12 DIAGNOSIS — Z992 Dependence on renal dialysis: Secondary | ICD-10-CM | POA: Diagnosis not present

## 2017-10-12 DIAGNOSIS — E1151 Type 2 diabetes mellitus with diabetic peripheral angiopathy without gangrene: Secondary | ICD-10-CM | POA: Diagnosis not present

## 2017-10-12 DIAGNOSIS — N186 End stage renal disease: Secondary | ICD-10-CM | POA: Diagnosis not present

## 2017-10-12 DIAGNOSIS — Z89411 Acquired absence of right great toe: Secondary | ICD-10-CM | POA: Diagnosis not present

## 2017-10-12 DIAGNOSIS — E1122 Type 2 diabetes mellitus with diabetic chronic kidney disease: Secondary | ICD-10-CM | POA: Diagnosis not present

## 2017-10-13 DIAGNOSIS — N2581 Secondary hyperparathyroidism of renal origin: Secondary | ICD-10-CM | POA: Diagnosis not present

## 2017-10-13 DIAGNOSIS — Z89411 Acquired absence of right great toe: Secondary | ICD-10-CM | POA: Diagnosis not present

## 2017-10-13 DIAGNOSIS — N186 End stage renal disease: Secondary | ICD-10-CM | POA: Diagnosis not present

## 2017-10-13 DIAGNOSIS — D631 Anemia in chronic kidney disease: Secondary | ICD-10-CM | POA: Diagnosis not present

## 2017-10-13 DIAGNOSIS — A499 Bacterial infection, unspecified: Secondary | ICD-10-CM | POA: Diagnosis not present

## 2017-10-13 DIAGNOSIS — E1129 Type 2 diabetes mellitus with other diabetic kidney complication: Secondary | ICD-10-CM | POA: Diagnosis not present

## 2017-10-13 DIAGNOSIS — Z992 Dependence on renal dialysis: Secondary | ICD-10-CM | POA: Diagnosis not present

## 2017-10-13 DIAGNOSIS — E1122 Type 2 diabetes mellitus with diabetic chronic kidney disease: Secondary | ICD-10-CM | POA: Diagnosis not present

## 2017-10-13 DIAGNOSIS — Z4781 Encounter for orthopedic aftercare following surgical amputation: Secondary | ICD-10-CM | POA: Diagnosis not present

## 2017-10-13 DIAGNOSIS — E1151 Type 2 diabetes mellitus with diabetic peripheral angiopathy without gangrene: Secondary | ICD-10-CM | POA: Diagnosis not present

## 2017-10-14 LAB — AEROBIC CULTURE W GRAM STAIN (SUPERFICIAL SPECIMEN)

## 2017-10-14 LAB — AEROBIC CULTURE  (SUPERFICIAL SPECIMEN)

## 2017-10-15 DIAGNOSIS — A499 Bacterial infection, unspecified: Secondary | ICD-10-CM | POA: Diagnosis not present

## 2017-10-15 DIAGNOSIS — D631 Anemia in chronic kidney disease: Secondary | ICD-10-CM | POA: Diagnosis not present

## 2017-10-15 DIAGNOSIS — E1129 Type 2 diabetes mellitus with other diabetic kidney complication: Secondary | ICD-10-CM | POA: Diagnosis not present

## 2017-10-15 DIAGNOSIS — N186 End stage renal disease: Secondary | ICD-10-CM | POA: Diagnosis not present

## 2017-10-15 DIAGNOSIS — N2581 Secondary hyperparathyroidism of renal origin: Secondary | ICD-10-CM | POA: Diagnosis not present

## 2017-10-17 ENCOUNTER — Ambulatory Visit (HOSPITAL_COMMUNITY)
Admission: RE | Admit: 2017-10-17 | Discharge: 2017-10-17 | Disposition: A | Discharge status: Home / Self Care | Payer: Medicare Other | Source: Ambulatory Visit | Attending: Internal Medicine | Admitting: Internal Medicine

## 2017-10-17 ENCOUNTER — Other Ambulatory Visit (HOSPITAL_BASED_OUTPATIENT_CLINIC_OR_DEPARTMENT_OTHER): Payer: Self-pay | Admitting: Internal Medicine

## 2017-10-17 DIAGNOSIS — B9620 Unspecified Escherichia coli [E. coli] as the cause of diseases classified elsewhere: Secondary | ICD-10-CM | POA: Diagnosis not present

## 2017-10-17 DIAGNOSIS — M86171 Other acute osteomyelitis, right ankle and foot: Secondary | ICD-10-CM | POA: Diagnosis not present

## 2017-10-17 DIAGNOSIS — Z89411 Acquired absence of right great toe: Secondary | ICD-10-CM | POA: Diagnosis not present

## 2017-10-17 DIAGNOSIS — L97519 Non-pressure chronic ulcer of other part of right foot with unspecified severity: Secondary | ICD-10-CM | POA: Diagnosis not present

## 2017-10-17 DIAGNOSIS — N186 End stage renal disease: Secondary | ICD-10-CM | POA: Diagnosis not present

## 2017-10-17 DIAGNOSIS — Z4781 Encounter for orthopedic aftercare following surgical amputation: Secondary | ICD-10-CM | POA: Diagnosis not present

## 2017-10-17 DIAGNOSIS — T8781 Dehiscence of amputation stump: Secondary | ICD-10-CM | POA: Diagnosis not present

## 2017-10-17 DIAGNOSIS — B9562 Methicillin resistant Staphylococcus aureus infection as the cause of diseases classified elsewhere: Secondary | ICD-10-CM | POA: Diagnosis not present

## 2017-10-17 DIAGNOSIS — E1122 Type 2 diabetes mellitus with diabetic chronic kidney disease: Secondary | ICD-10-CM | POA: Diagnosis not present

## 2017-10-17 DIAGNOSIS — L97512 Non-pressure chronic ulcer of other part of right foot with fat layer exposed: Secondary | ICD-10-CM | POA: Diagnosis not present

## 2017-10-17 DIAGNOSIS — T8131XA Disruption of external operation (surgical) wound, not elsewhere classified, initial encounter: Secondary | ICD-10-CM | POA: Diagnosis not present

## 2017-10-17 DIAGNOSIS — E1151 Type 2 diabetes mellitus with diabetic peripheral angiopathy without gangrene: Secondary | ICD-10-CM | POA: Diagnosis not present

## 2017-10-17 DIAGNOSIS — E11621 Type 2 diabetes mellitus with foot ulcer: Secondary | ICD-10-CM | POA: Diagnosis not present

## 2017-10-17 DIAGNOSIS — Z992 Dependence on renal dialysis: Secondary | ICD-10-CM | POA: Diagnosis not present

## 2017-10-18 DIAGNOSIS — E1129 Type 2 diabetes mellitus with other diabetic kidney complication: Secondary | ICD-10-CM | POA: Diagnosis not present

## 2017-10-18 DIAGNOSIS — D631 Anemia in chronic kidney disease: Secondary | ICD-10-CM | POA: Diagnosis not present

## 2017-10-18 DIAGNOSIS — A499 Bacterial infection, unspecified: Secondary | ICD-10-CM | POA: Diagnosis not present

## 2017-10-18 DIAGNOSIS — N2581 Secondary hyperparathyroidism of renal origin: Secondary | ICD-10-CM | POA: Diagnosis not present

## 2017-10-18 DIAGNOSIS — N186 End stage renal disease: Secondary | ICD-10-CM | POA: Diagnosis not present

## 2017-10-19 DIAGNOSIS — Z89411 Acquired absence of right great toe: Secondary | ICD-10-CM | POA: Diagnosis not present

## 2017-10-19 DIAGNOSIS — E1151 Type 2 diabetes mellitus with diabetic peripheral angiopathy without gangrene: Secondary | ICD-10-CM | POA: Diagnosis not present

## 2017-10-19 DIAGNOSIS — N186 End stage renal disease: Secondary | ICD-10-CM | POA: Diagnosis not present

## 2017-10-19 DIAGNOSIS — Z992 Dependence on renal dialysis: Secondary | ICD-10-CM | POA: Diagnosis not present

## 2017-10-19 DIAGNOSIS — Z4781 Encounter for orthopedic aftercare following surgical amputation: Secondary | ICD-10-CM | POA: Diagnosis not present

## 2017-10-19 DIAGNOSIS — E1122 Type 2 diabetes mellitus with diabetic chronic kidney disease: Secondary | ICD-10-CM | POA: Diagnosis not present

## 2017-10-20 ENCOUNTER — Telehealth: Payer: Self-pay | Admitting: *Deleted

## 2017-10-20 DIAGNOSIS — N186 End stage renal disease: Secondary | ICD-10-CM | POA: Diagnosis not present

## 2017-10-20 DIAGNOSIS — D631 Anemia in chronic kidney disease: Secondary | ICD-10-CM | POA: Diagnosis not present

## 2017-10-20 DIAGNOSIS — A499 Bacterial infection, unspecified: Secondary | ICD-10-CM | POA: Diagnosis not present

## 2017-10-20 DIAGNOSIS — E1129 Type 2 diabetes mellitus with other diabetic kidney complication: Secondary | ICD-10-CM | POA: Diagnosis not present

## 2017-10-20 DIAGNOSIS — N2581 Secondary hyperparathyroidism of renal origin: Secondary | ICD-10-CM | POA: Diagnosis not present

## 2017-10-20 NOTE — Telephone Encounter (Signed)
Called and spoke with myrtle (poa) and myrtle stated that the patient was doing good and that she has been going to the wound center and the cream was $286.00 co-pay and myrtle stated that they did not get it and the wound center is using the silver on the wound area and I stated to call the office if any concerns or questions at [phone]. Misty StanleyLisa

## 2017-10-21 DIAGNOSIS — E1151 Type 2 diabetes mellitus with diabetic peripheral angiopathy without gangrene: Secondary | ICD-10-CM | POA: Diagnosis not present

## 2017-10-21 DIAGNOSIS — N186 End stage renal disease: Secondary | ICD-10-CM | POA: Diagnosis not present

## 2017-10-21 DIAGNOSIS — Z89411 Acquired absence of right great toe: Secondary | ICD-10-CM | POA: Diagnosis not present

## 2017-10-21 DIAGNOSIS — Z992 Dependence on renal dialysis: Secondary | ICD-10-CM | POA: Diagnosis not present

## 2017-10-21 DIAGNOSIS — Z4781 Encounter for orthopedic aftercare following surgical amputation: Secondary | ICD-10-CM | POA: Diagnosis not present

## 2017-10-21 DIAGNOSIS — E1122 Type 2 diabetes mellitus with diabetic chronic kidney disease: Secondary | ICD-10-CM | POA: Diagnosis not present

## 2017-10-22 DIAGNOSIS — N2581 Secondary hyperparathyroidism of renal origin: Secondary | ICD-10-CM | POA: Diagnosis not present

## 2017-10-22 DIAGNOSIS — N186 End stage renal disease: Secondary | ICD-10-CM | POA: Diagnosis not present

## 2017-10-22 DIAGNOSIS — A499 Bacterial infection, unspecified: Secondary | ICD-10-CM | POA: Diagnosis not present

## 2017-10-22 DIAGNOSIS — E1129 Type 2 diabetes mellitus with other diabetic kidney complication: Secondary | ICD-10-CM | POA: Diagnosis not present

## 2017-10-22 DIAGNOSIS — D631 Anemia in chronic kidney disease: Secondary | ICD-10-CM | POA: Diagnosis not present

## 2017-10-24 DIAGNOSIS — L97512 Non-pressure chronic ulcer of other part of right foot with fat layer exposed: Secondary | ICD-10-CM | POA: Diagnosis not present

## 2017-10-24 DIAGNOSIS — E1151 Type 2 diabetes mellitus with diabetic peripheral angiopathy without gangrene: Secondary | ICD-10-CM | POA: Diagnosis not present

## 2017-10-24 DIAGNOSIS — T8781 Dehiscence of amputation stump: Secondary | ICD-10-CM | POA: Diagnosis not present

## 2017-10-24 DIAGNOSIS — E1122 Type 2 diabetes mellitus with diabetic chronic kidney disease: Secondary | ICD-10-CM | POA: Diagnosis not present

## 2017-10-24 DIAGNOSIS — N186 End stage renal disease: Secondary | ICD-10-CM | POA: Diagnosis not present

## 2017-10-24 DIAGNOSIS — B9562 Methicillin resistant Staphylococcus aureus infection as the cause of diseases classified elsewhere: Secondary | ICD-10-CM | POA: Diagnosis not present

## 2017-10-24 DIAGNOSIS — Z992 Dependence on renal dialysis: Secondary | ICD-10-CM | POA: Diagnosis not present

## 2017-10-24 DIAGNOSIS — E11621 Type 2 diabetes mellitus with foot ulcer: Secondary | ICD-10-CM | POA: Diagnosis not present

## 2017-10-24 DIAGNOSIS — B9620 Unspecified Escherichia coli [E. coli] as the cause of diseases classified elsewhere: Secondary | ICD-10-CM | POA: Diagnosis not present

## 2017-10-24 DIAGNOSIS — Z4781 Encounter for orthopedic aftercare following surgical amputation: Secondary | ICD-10-CM | POA: Diagnosis not present

## 2017-10-24 DIAGNOSIS — Z89411 Acquired absence of right great toe: Secondary | ICD-10-CM | POA: Diagnosis not present

## 2017-10-25 DIAGNOSIS — D631 Anemia in chronic kidney disease: Secondary | ICD-10-CM | POA: Diagnosis not present

## 2017-10-25 DIAGNOSIS — N2581 Secondary hyperparathyroidism of renal origin: Secondary | ICD-10-CM | POA: Diagnosis not present

## 2017-10-25 DIAGNOSIS — E1129 Type 2 diabetes mellitus with other diabetic kidney complication: Secondary | ICD-10-CM | POA: Diagnosis not present

## 2017-10-25 DIAGNOSIS — A499 Bacterial infection, unspecified: Secondary | ICD-10-CM | POA: Diagnosis not present

## 2017-10-25 DIAGNOSIS — N186 End stage renal disease: Secondary | ICD-10-CM | POA: Diagnosis not present

## 2017-10-26 ENCOUNTER — Encounter (HOSPITAL_BASED_OUTPATIENT_CLINIC_OR_DEPARTMENT_OTHER): Payer: Medicare Other

## 2017-10-26 DIAGNOSIS — Z89411 Acquired absence of right great toe: Secondary | ICD-10-CM | POA: Diagnosis not present

## 2017-10-26 DIAGNOSIS — E1151 Type 2 diabetes mellitus with diabetic peripheral angiopathy without gangrene: Secondary | ICD-10-CM | POA: Diagnosis not present

## 2017-10-26 DIAGNOSIS — Z992 Dependence on renal dialysis: Secondary | ICD-10-CM | POA: Diagnosis not present

## 2017-10-26 DIAGNOSIS — N186 End stage renal disease: Secondary | ICD-10-CM | POA: Diagnosis not present

## 2017-10-26 DIAGNOSIS — E1122 Type 2 diabetes mellitus with diabetic chronic kidney disease: Secondary | ICD-10-CM | POA: Diagnosis not present

## 2017-10-26 DIAGNOSIS — Z4781 Encounter for orthopedic aftercare following surgical amputation: Secondary | ICD-10-CM | POA: Diagnosis not present

## 2017-10-27 DIAGNOSIS — N186 End stage renal disease: Secondary | ICD-10-CM | POA: Diagnosis not present

## 2017-10-27 DIAGNOSIS — N2581 Secondary hyperparathyroidism of renal origin: Secondary | ICD-10-CM | POA: Diagnosis not present

## 2017-10-27 DIAGNOSIS — A499 Bacterial infection, unspecified: Secondary | ICD-10-CM | POA: Diagnosis not present

## 2017-10-27 DIAGNOSIS — D631 Anemia in chronic kidney disease: Secondary | ICD-10-CM | POA: Diagnosis not present

## 2017-10-27 DIAGNOSIS — E1129 Type 2 diabetes mellitus with other diabetic kidney complication: Secondary | ICD-10-CM | POA: Diagnosis not present

## 2017-10-28 ENCOUNTER — Telehealth: Payer: Self-pay | Admitting: Podiatry

## 2017-10-28 MED ORDER — HYDROCODONE-ACETAMINOPHEN 5-325 MG PO TABS: 1.0000 | ORAL_TABLET | Freq: Four times a day (QID) | ORAL | 0 refills | Status: DC | PRN

## 2017-10-28 NOTE — Telephone Encounter (Addendum)
I informed pt's son, Channing MuttersRoy the rx could be picked up today in the [place] office before 1:00pm

## 2017-10-28 NOTE — Telephone Encounter (Signed)
I'm calling on behalf of Jamie GardnerBetty [name]. She is wanting to know if she can get a refill on her pain medication due to pain that she is still experiencing from her surgery. My cell phone number is [phone] and [name]'s phone number is [phone]. Thank you so much. Please call me again.

## 2017-10-28 NOTE — Addendum Note (Signed)
Addended by: Alphia Kava'CONNELL, Lizbett Garciagarcia D on: 10/28/2017 10:16 AM   Modules accepted: Orders

## 2017-10-29 DIAGNOSIS — A499 Bacterial infection, unspecified: Secondary | ICD-10-CM | POA: Diagnosis not present

## 2017-10-29 DIAGNOSIS — N186 End stage renal disease: Secondary | ICD-10-CM | POA: Diagnosis not present

## 2017-10-29 DIAGNOSIS — N2581 Secondary hyperparathyroidism of renal origin: Secondary | ICD-10-CM | POA: Diagnosis not present

## 2017-10-29 DIAGNOSIS — E1129 Type 2 diabetes mellitus with other diabetic kidney complication: Secondary | ICD-10-CM | POA: Diagnosis not present

## 2017-10-29 DIAGNOSIS — D631 Anemia in chronic kidney disease: Secondary | ICD-10-CM | POA: Diagnosis not present

## 2017-10-31 DIAGNOSIS — B9562 Methicillin resistant Staphylococcus aureus infection as the cause of diseases classified elsewhere: Secondary | ICD-10-CM | POA: Diagnosis not present

## 2017-10-31 DIAGNOSIS — L97512 Non-pressure chronic ulcer of other part of right foot with fat layer exposed: Secondary | ICD-10-CM | POA: Diagnosis not present

## 2017-10-31 DIAGNOSIS — B9620 Unspecified Escherichia coli [E. coli] as the cause of diseases classified elsewhere: Secondary | ICD-10-CM | POA: Diagnosis not present

## 2017-10-31 DIAGNOSIS — T8781 Dehiscence of amputation stump: Secondary | ICD-10-CM | POA: Diagnosis not present

## 2017-10-31 DIAGNOSIS — E1151 Type 2 diabetes mellitus with diabetic peripheral angiopathy without gangrene: Secondary | ICD-10-CM | POA: Diagnosis not present

## 2017-10-31 DIAGNOSIS — E11621 Type 2 diabetes mellitus with foot ulcer: Secondary | ICD-10-CM | POA: Diagnosis not present

## 2017-10-31 DIAGNOSIS — T8131XA Disruption of external operation (surgical) wound, not elsewhere classified, initial encounter: Secondary | ICD-10-CM | POA: Diagnosis not present

## 2017-10-31 NOTE — Progress Notes (Signed)
Established Critical Limb Ischemia Patient   History of Present Illness   Jamie LippsBetty G [name] is a 84 y.o. (02/16/1933) female who presents with chief complaint: drainage from R foot.    Prior procedures include: 1. Partial R 1st ray partial amputation by Podiatry for gangrene(Dr. Ardelle AntonWagoner) 2. PTA R pop, PTA+S R SFA (06/24/17) by Dr. Darrick PennaFields 3. Ao, BRo (07/05/16)   Pt's studies demonstrated no additional revascularization options.  We has discussed peviously that if the R 1st ray amputation fails, R BKA will need to be considered.  Her right foot: more drainage over the last week, dressing being changed every other day  The patient's PMH, PSH, SH, and FamHx were reviewed on 11/03/27 are unchanged from 09/28/17.  Current Outpatient Medications  Medication Sig Dispense Refill  . acetaminophen (TYLENOL) 500 MG tablet Take 1,000 mg by mouth every 6 (six) hours as needed for mild pain or headache.     Marland Kitchen. aspirin 81 MG tablet Take 81 mg by mouth daily.     . B Complex-C-Folic Acid (RENAL) 1 MG CAPS Take 1 capsule by mouth daily.  3  . clindamycin (CLEOCIN) 300 MG capsule Take 1 capsule (300 mg total) by mouth 3 (three) times daily. 30 capsule 0  . clopidogrel (PLAVIX) 75 MG tablet Take 1 tablet (75 mg total) by mouth daily. 30 tablet 11  . collagenase (SANTYL) ointment Apply 1 application topically daily. 15 g 5  . Doxercalciferol (HECTOROL) 2 MCG/ML SOLN Inject 3 mcg into the vein 3 (three) times a week.    . gabapentin (NEURONTIN) 100 MG capsule Take 100 mg by mouth at bedtime.     Marland Kitchen. HYDROcodone-acetaminophen (NORCO) 5-325 MG tablet Take 1 tablet by mouth every 6 (six) hours as needed for moderate pain. 20 tablet 0  . HYDROcodone-acetaminophen (NORCO/VICODIN) 5-325 MG tablet Take 1 tablet by mouth every 4 (four) hours as needed. 20 tablet 0  . IRON SUCROSE IV Inject 1 Dose into the vein once a week.    . meperidine (DEMEROL) 50 MG tablet Take 1 tablet (50 mg total) by mouth every 6 (six) hours as  needed. 10 tablet 0  . Methoxy PEG-Epoetin Beta (MIRCERA) 75 MCG/0.3ML SOSY Inject 75 mcg as directed every 14 (fourteen) days.    . metroNIDAZOLE (FLAGYL) 500 MG tablet Take 1 tablet (500 mg total) by mouth 2 (two) times daily. 60 tablet 1  . mupirocin ointment (BACTROBAN) 2 % Apply 1 application topically 2 (two) times daily. 30 g 2  . nitroGLYCERIN (NITRO-DUR) 0.1 mg/hr patch Remove prior patch and apply 0.1 mg patch to R foot daily. 30 patch 1  . oxyCODONE-acetaminophen (PERCOCET/ROXICET) 5-325 MG tablet Take 1 tablet by mouth every 6 (six) hours as needed. 10 tablet 0  . RENVELA 800 MG tablet Take 1,600-2,400 mg by mouth See admin instructions. Take 2400 mg by mouth 3 times daily with meals and take 1600 mg by mouth with snacks    . SENSIPAR 30 MG tablet Take 30 mg by mouth every evening.     . silver sulfADIAZINE (SILVADENE) 1 % cream Apply 1 application topically daily. 50 g 0   Current Facility-Administered Medications  Medication Dose Route Frequency Provider Last Rate Last Dose  . protamine injection 25 mg  25 mg Intravenous Once Sherren KernsFields, Charles E, MD        On ROS today: +drainage, no fever or chills   Physical Examination   Vitals:   11/02/17 0929  BP: (!) 156/75  Pulse: 72  Temp: (!) 96.6 F (35.9 C)  TempSrc: Oral  SpO2: 100%  Weight: 180 lb (81.6 kg)  Height: 5\' 2"  (1.575 m)   Body mass index is 32.92 kg/m.  General Alert, O x 3, WD, NAD  Pulmonary Sym exp, good B air movt, CTA B  Cardiac RRR, Nl S1, S2, no Murmurs, No rubs, No S3,S4  Vascular Vessel Right Left  Radial Palpable Palpable  Brachial Palpable Palpable  Carotid Palpable, No Bruit Palpable, No Bruit  Aorta Not palpable N/A  Femoral Palpable Palpable  Popliteal Not palpable Not palpable  PT Not palpable Not palpable  DP Not palpable Not palpable    Gastro- intestinal soft, non-distended, non-tender to palpation, No guarding or rebound, no HSM, no masses, no CVAT B, No palpable prominent aortic  pulse,    Musculo- skeletal M/S 5/5 throughout  , Extremities without ischemic changes except R 1st ray amputation: open with frankly necrotic tissue at the base, 2nd toe is sloughing skin,  Neurologic Pain and light touch intact in extremities except for decreased sensation in R foot, Motor exam as listed above    Non-invasive Vascular Imaging   ABI (11/02/2017)  R:   ABI: 1.08 (0.89),   PT: mono  DP: mono  TBI:  ND  L:   ABI: [facility] (1.34),   PT: mono  DP: mono  TBI: ND  RLE arterial duplex (11/02/2017)  CFA and PFA biphasic  SFA: proximal bi, mid-distal mono  Stent widely patent  Medical Decision Making   [name] is a 84 y.o. female who presents with: RLE critical limb ischemia, s/p R SFA/pop stenting, R 1st ray amputation with ischemic skin   Pt needs to consider R BKA as the R foot necrosis has worsened.  She is not ready to proceed.  Will get the patient a second opinion with Dr. Lajoyce Corners, [facility].  Pt can follow up in 4-6 weeks.  I discussed in depth with the patient the nature of atherosclerosis, and emphasized the importance of maximal medical management including strict control of blood pressure, blood glucose, and lipid levels, antiplatelet agents, obtaining regular exercise, and cessation of smoking.    The patient is aware that without maximal medical management the underlying atherosclerotic disease process will progress, limiting the benefit of any interventions.  The patient is currently not on on statin as not medically indicated.   The patient is currently on an anti-platelet: ASA.  Thank you for allowing [place] to participate in this patient's care.   Leonides Sake, MD, FACS [facility] Office: [phone] Pager: [phone]

## 2017-11-01 DIAGNOSIS — N186 End stage renal disease: Secondary | ICD-10-CM | POA: Diagnosis not present

## 2017-11-01 DIAGNOSIS — E1129 Type 2 diabetes mellitus with other diabetic kidney complication: Secondary | ICD-10-CM | POA: Diagnosis not present

## 2017-11-01 DIAGNOSIS — D631 Anemia in chronic kidney disease: Secondary | ICD-10-CM | POA: Diagnosis not present

## 2017-11-01 DIAGNOSIS — N2581 Secondary hyperparathyroidism of renal origin: Secondary | ICD-10-CM | POA: Diagnosis not present

## 2017-11-01 DIAGNOSIS — A499 Bacterial infection, unspecified: Secondary | ICD-10-CM | POA: Diagnosis not present

## 2017-11-02 ENCOUNTER — Ambulatory Visit (INDEPENDENT_AMBULATORY_CARE_PROVIDER_SITE_OTHER): Payer: Medicare Other | Admitting: Vascular Surgery

## 2017-11-02 ENCOUNTER — Ambulatory Visit (INDEPENDENT_AMBULATORY_CARE_PROVIDER_SITE_OTHER)
Admission: RE | Admit: 2017-11-02 | Discharge: 2017-11-02 | Disposition: A | Discharge status: Home / Self Care | Payer: Medicare Other | Source: Ambulatory Visit | Attending: Vascular Surgery | Admitting: Vascular Surgery

## 2017-11-02 ENCOUNTER — Ambulatory Visit (HOSPITAL_COMMUNITY)
Admission: RE | Admit: 2017-11-02 | Discharge: 2017-11-02 | Disposition: A | Discharge status: Home / Self Care | Payer: Medicare Other | Source: Ambulatory Visit | Attending: Vascular Surgery | Admitting: Vascular Surgery

## 2017-11-02 ENCOUNTER — Encounter: Payer: Self-pay | Admitting: Vascular Surgery

## 2017-11-02 VITALS — BP 156/75 | HR 72 | Temp 96.6°F | Ht 62.0 in | Wt 180.0 lb

## 2017-11-02 DIAGNOSIS — L98499 Non-pressure chronic ulcer of skin of other sites with unspecified severity: Secondary | ICD-10-CM

## 2017-11-02 DIAGNOSIS — I7025 Atherosclerosis of native arteries of other extremities with ulceration: Secondary | ICD-10-CM | POA: Diagnosis not present

## 2017-11-02 DIAGNOSIS — E1122 Type 2 diabetes mellitus with diabetic chronic kidney disease: Secondary | ICD-10-CM | POA: Diagnosis not present

## 2017-11-02 DIAGNOSIS — Z4781 Encounter for orthopedic aftercare following surgical amputation: Secondary | ICD-10-CM | POA: Diagnosis not present

## 2017-11-02 DIAGNOSIS — Z992 Dependence on renal dialysis: Secondary | ICD-10-CM | POA: Diagnosis not present

## 2017-11-02 DIAGNOSIS — E1151 Type 2 diabetes mellitus with diabetic peripheral angiopathy without gangrene: Secondary | ICD-10-CM | POA: Diagnosis not present

## 2017-11-02 DIAGNOSIS — I739 Peripheral vascular disease, unspecified: Secondary | ICD-10-CM

## 2017-11-02 DIAGNOSIS — N186 End stage renal disease: Secondary | ICD-10-CM | POA: Diagnosis not present

## 2017-11-02 DIAGNOSIS — Z89411 Acquired absence of right great toe: Secondary | ICD-10-CM | POA: Diagnosis not present

## 2017-11-02 MED ORDER — NITROGLYCERIN 0.1 MG/HR TD PT24: MEDICATED_PATCH | TRANSDERMAL | 1 refills | Status: DC

## 2017-11-02 NOTE — Addendum Note (Signed)
Addended by: Ronette DeterFARRINGTON, Akira Adelsberger V on: 11/02/2017 10:11 AM   Modules accepted: Orders

## 2017-11-03 ENCOUNTER — Encounter (INDEPENDENT_AMBULATORY_CARE_PROVIDER_SITE_OTHER): Payer: Self-pay | Admitting: Orthopedic Surgery

## 2017-11-03 ENCOUNTER — Ambulatory Visit (INDEPENDENT_AMBULATORY_CARE_PROVIDER_SITE_OTHER): Payer: Medicare Other | Admitting: Orthopedic Surgery

## 2017-11-03 VITALS — Ht 62.0 in | Wt 180.0 lb

## 2017-11-03 DIAGNOSIS — I96 Gangrene, not elsewhere classified: Secondary | ICD-10-CM

## 2017-11-03 DIAGNOSIS — I7025 Atherosclerosis of native arteries of other extremities with ulceration: Secondary | ICD-10-CM | POA: Diagnosis not present

## 2017-11-03 DIAGNOSIS — D631 Anemia in chronic kidney disease: Secondary | ICD-10-CM | POA: Diagnosis not present

## 2017-11-03 DIAGNOSIS — E1129 Type 2 diabetes mellitus with other diabetic kidney complication: Secondary | ICD-10-CM | POA: Diagnosis not present

## 2017-11-03 DIAGNOSIS — N2581 Secondary hyperparathyroidism of renal origin: Secondary | ICD-10-CM | POA: Diagnosis not present

## 2017-11-03 DIAGNOSIS — A499 Bacterial infection, unspecified: Secondary | ICD-10-CM | POA: Diagnosis not present

## 2017-11-03 DIAGNOSIS — N186 End stage renal disease: Secondary | ICD-10-CM | POA: Diagnosis not present

## 2017-11-03 NOTE — Progress Notes (Signed)
Office Visit Note   Patient: [name]           Date of Birth: 31-Jul-1932           MRN: [id number] Visit Date: 11/03/2017              Requested by: Mila Palmer, MD [address] [place], [place] [address] PCP: Mila Palmer, MD  Chief Complaint  Patient presents with  . Right Foot - Open Wound    Hx right GT amputation with podiatry and vascular work up      HPI: Patient is a 84 year old woman who is status post right foot first ray amputation.  Patient has had progressive dehiscence with gangrenous changes to the second toe.  She has undergone vascular work-up and is not felt to be a revascularization candidate by Dr. Johny Drilling.  She is seen today for evaluation and treatment.  She is on dialysis Tuesday Thursday and Saturday.  Assessment & Plan: Visit Diagnoses:  1. Gangrene of right foot (HCC)     Plan: Discussed with the patient her best option would be to proceed with a transtibial amputation risks and benefits of surgery were discussed including risk of the wound not healing need for additional surgery.  Patient states she understands wishes to proceed next week.  We will plan for surgery on Friday with dialysis in the hospital plan for discharge to skilled nursing.  Follow-Up Instructions: Return in about 3 weeks (around 11/24/2017).   Ortho Exam  Patient is alert, oriented, no adenopathy, well-dressed, normal affect, normal respiratory effort. Examination patient does not have a palpable pulse she does have venous swelling she has a necrotic wound from the first ray amputation with dry gangrenous changes of the second toe.  She has decreased turgor in her heel pad with decreased circulation to the entire foot.  Imaging: No results found. No images are attached to the encounter.  Labs: Lab Results  Component Value Date   HGBA1C 5.8 (H) 12/15/2013   HGBA1C 5.7 (H) 10/31/2012   HGBA1C (H) 11/08/2006    6.4 (NOTE)   The ADA recommends the  following therapeutic goals for glycemic   control related to Hgb A1C measurement:   Goal of Therapy:   < 7.0% Hgb A1C   Action Suggested:  > 8.0% Hgb A1C   Ref:  Diabetes Care, 22, Suppl. 1, 1999   ESRSEDRATE 17 12/17/2006   ESRSEDRATE 108 (H) 10/17/2006   ESRSEDRATE 110 (H) 10/12/2006   LABURIC 10.7 (H) 10/12/2006   REPTSTATUS 10/14/2017 FINAL 10/11/2017   GRAMSTAIN  10/11/2017    FEW WBC PRESENT, PREDOMINANTLY PMN ABUNDANT GRAM POSITIVE COCCI IN PAIRS ABUNDANT GRAM NEGATIVE RODS MODERATE GRAM POSITIVE RODS Performed at [facility], [address], [place], [place] [address]    CULT  10/11/2017    MODERATE ESCHERICHIA COLI FEW METHICILLIN RESISTANT STAPHYLOCOCCUS AUREUS    LABORGA ESCHERICHIA COLI 10/11/2017   LABORGA METHICILLIN RESISTANT STAPHYLOCOCCUS AUREUS 10/11/2017     Lab Results  Component Value Date   ALBUMIN 3.4 (L) 07/08/2015   ALBUMIN 3.2 (L) 06/03/2014   ALBUMIN 2.5 (L) 01/01/2014   LABURIC 10.7 (H) 10/12/2006    Body mass index is 32.92 kg/m.  Orders:  No orders of the defined types were placed in this encounter.  No orders of the defined types were placed in this encounter.    Procedures: No procedures performed  Clinical Data: No additional findings.  ROS:  All other  systems negative, except as noted in the HPI. Review of Systems  Objective: Vital Signs: Ht 5\' 2"  (1.575 m)   Wt 180 lb (81.6 kg)   BMI 32.92 kg/m   Specialty Comments:  No specialty comments available.  PMFS History: Patient Active Problem List   Diagnosis Date Noted  . Osteomyelitis, chronic, ankle or foot, right (HCC) 07/11/2017  . PAD (peripheral artery disease) (HCC) 04/29/2015  . Atherosclerosis of native arteries of the extremities with ulceration (HCC) 01/10/2014  . CVA (cerebral infarction) 12/14/2013  . Dysphagia, unspecified(787.20) 11/28/2013  . Diabetes mellitus (HCC) 10/31/2012  . Depression 10/31/2012  . Anemia 10/31/2012  . History of  Clostridium difficile colitis 10/31/2012  . Chronic diarrhea 10/31/2012  . ESRD on dialysis [facility](HCC) 10/06/2012   Past Medical History:  Diagnosis Date  . Anemia   . C. difficile diarrhea   . Chronic kidney disease    on Dialysis  . Depression   . Diabetes mellitus   . Diabetic neuropathy (HCC)   . GERD (gastroesophageal reflux disease)   . Gout   . Hyperparathyroidism   . MRSA (methicillin resistant staph aureus) culture positive   . Obesity   . Osteoarthritis   . Peripheral arterial disease (HCC)    Gangrene-Left Great Toe  . Shortness of breath dyspnea    with exertion  . Stroke [facility](HCC)     Family History  Problem Relation Age of Onset  . Cancer Father        type unknown  . Stroke Mother   . Diabetes Sister   . Cancer Brother        type unknown    Past Surgical History:  Procedure Laterality Date  . ABDOMINAL AORTOGRAM N/A 07/05/2016   Procedure: Abdominal Aortogram;  Surgeon: Maeola HarmanBrandon Christopher Cain, MD;  [facility];  Service: Cardiovascular;  Laterality: N/A;  . ABDOMINAL AORTOGRAM N/A 06/24/2017   Procedure: ABDOMINAL AORTOGRAM;  Surgeon: Sherren KernsFields, Charles E, MD;  [facility];  Service: Cardiovascular;  Laterality: N/A;  . ABDOMINAL HYSTERECTOMY    . AV FISTULA PLACEMENT Left 07/07/2015   Procedure: ATTEMPTED INSERTION OFLEFT  ARTERIOVENOUS (AV) GORE-TEX GRAFT THIGH;  Surgeon: Fransisco HertzBrian L Chen, MD;  Location: [facility] OR;  Service: Vascular;  Laterality: Left;  . BREAST EXCISIONAL BIOPSY Right 05/25/2006  . COLONOSCOPY    . LOWER EXTREMITY ANGIOGRAPHY Bilateral 07/05/2016   Procedure: Lower Extremity Angiography;  Surgeon: Maeola HarmanBrandon Christopher Cain, MD;  [facility];  Service: Cardiovascular;  Laterality: Bilateral;  . LOWER EXTREMITY ANGIOGRAPHY Bilateral 06/24/2017   Procedure: Lower Extremity Angiography;  Surgeon: Sherren KernsFields, Charles E, MD;  [facility];  Service: Cardiovascular;  Laterality: Bilateral;  .  MULTIPLE TOOTH EXTRACTIONS    . PERIPHERAL VASCULAR INTERVENTION Right 06/24/2017   Procedure: PERIPHERAL VASCULAR INTERVENTION;  Surgeon: Sherren KernsFields, Charles E, MD;  [facility];  Service: Cardiovascular;  Laterality: Right;  POPLITEALPTA SFA STENT  . SHUNTOGRAM Right 09/06/12   Thigh graft  . SP DECLOT AVGG Right Sep 14, 2012   Right thigh graft   Social History   Occupational History  . Occupation: retired    Associate Professormployer: RETIRED  Tobacco Use  . Smoking status: Former Smoker    Types: Cigarettes  . Smokeless tobacco: Never Used  Substance and Sexual Activity  . Alcohol use: No  . Drug use: No  . Sexual activity: Never

## 2017-11-04 DIAGNOSIS — N186 End stage renal disease: Secondary | ICD-10-CM | POA: Diagnosis not present

## 2017-11-04 DIAGNOSIS — Z4781 Encounter for orthopedic aftercare following surgical amputation: Secondary | ICD-10-CM | POA: Diagnosis not present

## 2017-11-04 DIAGNOSIS — Z992 Dependence on renal dialysis: Secondary | ICD-10-CM | POA: Diagnosis not present

## 2017-11-04 DIAGNOSIS — E1122 Type 2 diabetes mellitus with diabetic chronic kidney disease: Secondary | ICD-10-CM | POA: Diagnosis not present

## 2017-11-04 DIAGNOSIS — Z89411 Acquired absence of right great toe: Secondary | ICD-10-CM | POA: Diagnosis not present

## 2017-11-04 DIAGNOSIS — E1151 Type 2 diabetes mellitus with diabetic peripheral angiopathy without gangrene: Secondary | ICD-10-CM | POA: Diagnosis not present

## 2017-11-05 DIAGNOSIS — A499 Bacterial infection, unspecified: Secondary | ICD-10-CM | POA: Diagnosis not present

## 2017-11-05 DIAGNOSIS — N2581 Secondary hyperparathyroidism of renal origin: Secondary | ICD-10-CM | POA: Diagnosis not present

## 2017-11-05 DIAGNOSIS — E1129 Type 2 diabetes mellitus with other diabetic kidney complication: Secondary | ICD-10-CM | POA: Diagnosis not present

## 2017-11-05 DIAGNOSIS — N186 End stage renal disease: Secondary | ICD-10-CM | POA: Diagnosis not present

## 2017-11-05 DIAGNOSIS — D631 Anemia in chronic kidney disease: Secondary | ICD-10-CM | POA: Diagnosis not present

## 2017-11-07 ENCOUNTER — Encounter (HOSPITAL_BASED_OUTPATIENT_CLINIC_OR_DEPARTMENT_OTHER): Payer: Medicare Other | Attending: Internal Medicine

## 2017-11-07 ENCOUNTER — Other Ambulatory Visit (INDEPENDENT_AMBULATORY_CARE_PROVIDER_SITE_OTHER): Payer: Self-pay | Admitting: Orthopedic Surgery

## 2017-11-07 DIAGNOSIS — I96 Gangrene, not elsewhere classified: Secondary | ICD-10-CM

## 2017-11-07 DIAGNOSIS — E1129 Type 2 diabetes mellitus with other diabetic kidney complication: Secondary | ICD-10-CM | POA: Diagnosis not present

## 2017-11-07 DIAGNOSIS — Z992 Dependence on renal dialysis: Secondary | ICD-10-CM | POA: Diagnosis not present

## 2017-11-07 DIAGNOSIS — N186 End stage renal disease: Secondary | ICD-10-CM | POA: Diagnosis not present

## 2017-11-08 ENCOUNTER — Encounter (HOSPITAL_COMMUNITY): Payer: Self-pay | Admitting: *Deleted

## 2017-11-08 ENCOUNTER — Other Ambulatory Visit: Payer: Self-pay

## 2017-11-08 DIAGNOSIS — N186 End stage renal disease: Secondary | ICD-10-CM | POA: Diagnosis not present

## 2017-11-08 DIAGNOSIS — D631 Anemia in chronic kidney disease: Secondary | ICD-10-CM | POA: Diagnosis not present

## 2017-11-08 DIAGNOSIS — E1129 Type 2 diabetes mellitus with other diabetic kidney complication: Secondary | ICD-10-CM | POA: Diagnosis not present

## 2017-11-08 DIAGNOSIS — D509 Iron deficiency anemia, unspecified: Secondary | ICD-10-CM | POA: Diagnosis not present

## 2017-11-08 DIAGNOSIS — N2581 Secondary hyperparathyroidism of renal origin: Secondary | ICD-10-CM | POA: Diagnosis not present

## 2017-11-08 NOTE — Progress Notes (Signed)
Pt SDW-pre-op call was completed by pt niece Myrtle (DPR) . Niece denies that pt is under the care of a cardiologist. Niece denies that pt C/O any acute cardiopulmonary issues. Niece denies that pt had a stress test and cardiac cath. Niece made aware to have pt stop otc vitamins, fish oil , herbal medications and NSAID's ie: Ibuprofen, Advil, Naproxen (Aleve), Motrin, BC and Goody Powder. Niece stated that pt is no longer considered diabetic, was taken off of diabetes medications and no longer checks blood glucose. Niece verbalized understanding of all pre-op instructions.

## 2017-11-09 DIAGNOSIS — E1151 Type 2 diabetes mellitus with diabetic peripheral angiopathy without gangrene: Secondary | ICD-10-CM | POA: Diagnosis not present

## 2017-11-09 DIAGNOSIS — N186 End stage renal disease: Secondary | ICD-10-CM | POA: Diagnosis not present

## 2017-11-09 DIAGNOSIS — E1122 Type 2 diabetes mellitus with diabetic chronic kidney disease: Secondary | ICD-10-CM | POA: Diagnosis not present

## 2017-11-09 DIAGNOSIS — Z992 Dependence on renal dialysis: Secondary | ICD-10-CM | POA: Diagnosis not present

## 2017-11-09 DIAGNOSIS — Z4781 Encounter for orthopedic aftercare following surgical amputation: Secondary | ICD-10-CM | POA: Diagnosis not present

## 2017-11-09 DIAGNOSIS — Z89411 Acquired absence of right great toe: Secondary | ICD-10-CM | POA: Diagnosis not present

## 2017-11-09 NOTE — Progress Notes (Signed)
Anesthesia Chart Review:   Case:  [id number] Date/Time:  11/11/17 [id number]   Procedure:  RIGHT BELOW KNEE AMPUTATION (Right )   Anesthesia type:  General   Pre-op diagnosis:  Gangrene Right Foot   [facility]  [facility] 08 / [facility] OR   Surgeon:  Nadara Mustarduda, Marcus V, MD      DISCUSSION: - Pt is an 84 year old female with hx stroke, DM, anemia, ESRD on HD, PAD   PROVIDERS: PCP is Mila PalmerWolters, Sharon, MD   LABS: Will be obtained day of surgery    IMAGES:  CXR 06/27/17: No active cardiopulmonary disease.   EKG 06/24/17: NSR. LAD. Low voltage QRS. Inferior infarct, age undetermined. Possible Anterolateral infarct, age undetermined   CV:  Echo 12/15/13:  - Left ventricle: The cavity size was normal. There was moderate concentric hypertrophy. Systolic function was normal. The estimated ejection fraction was in the range of 55% to 60%. Wall motion was normal; there were no regional wall motion abnormalities. Doppler parameters are consistent with abnormal left ventricular relaxation (grade 1 diastolic dysfunction). The E/e&' ratio is between 8-15, suggesting indeterminate LV filling pressure. - Mitral valve: Calcified annulus. The anterior leaflet is calcified and less mobile. There was mild regurgitation. - Left atrium: Moderately dilated 42 ml/m2. - Right ventricle: The cavity size was normal. Wall thickness was normal. - Right atrium: The atrium was mildly dilated. - Atrial septum: There was a small patent foramen ovale by color doppler. - Tricuspid valve: There was mild regurgitation. - Pulmonary arteries: PA peak pressure: 32 mm Hg (S). - Impressions: Compared to the prior study in 2014, there are few changes. LVEFis 55-60%, moderate LAE and there is borderline increasedpulmoanry pressure. There also appears to be a small PFO by colordoppler.   Past Medical History:  Diagnosis Date  . Anemia   . C. difficile diarrhea   . Chronic kidney disease    on Dialysis  . Depression   . Diabetes  mellitus   . Diabetic neuropathy (HCC)   . Gangrene (HCC)    right foot  . GERD (gastroesophageal reflux disease)   . Gout   . Hyperparathyroidism   . MRSA (methicillin resistant staph aureus) culture positive   . Obesity   . Osteoarthritis   . Peripheral arterial disease (HCC)    Gangrene-Left Great Toe  . Shortness of breath dyspnea    with exertion  . Stroke [facility](HCC)     Past Surgical History:  Procedure Laterality Date  . ABDOMINAL AORTOGRAM N/A 07/05/2016   Procedure: Abdominal Aortogram;  Surgeon: Maeola HarmanBrandon Christopher Cain, MD;  [facility];  Service: Cardiovascular;  Laterality: N/A;  . ABDOMINAL AORTOGRAM N/A 06/24/2017   Procedure: ABDOMINAL AORTOGRAM;  Surgeon: Sherren KernsFields, Charles E, MD;  [facility];  Service: Cardiovascular;  Laterality: N/A;  . ABDOMINAL HYSTERECTOMY    . AV FISTULA PLACEMENT Left 07/07/2015   Procedure: ATTEMPTED INSERTION OFLEFT  ARTERIOVENOUS (AV) GORE-TEX GRAFT THIGH;  Surgeon: Fransisco HertzBrian L Chen, MD;  Location: [facility] OR;  Service: Vascular;  Laterality: Left;  . BREAST EXCISIONAL BIOPSY Right 05/25/2006  . COLONOSCOPY    . LOWER EXTREMITY ANGIOGRAPHY Bilateral 07/05/2016   Procedure: Lower Extremity Angiography;  Surgeon: Maeola HarmanBrandon Christopher Cain, MD;  [facility];  Service: Cardiovascular;  Laterality: Bilateral;  . LOWER EXTREMITY ANGIOGRAPHY Bilateral 06/24/2017   Procedure: Lower Extremity Angiography;  Surgeon: Sherren KernsFields, Charles E, MD;  [facility];  Service: Cardiovascular;  Laterality: Bilateral;  .  MULTIPLE TOOTH EXTRACTIONS    . PERIPHERAL VASCULAR INTERVENTION Right 06/24/2017   Procedure: PERIPHERAL VASCULAR INTERVENTION;  Surgeon: Sherren Kerns, MD;  [facility] CV LAB;  Service: Cardiovascular;  Laterality: Right;  POPLITEALPTA SFA STENT  . SHUNTOGRAM Right 09/06/12   Thigh graft  . SP DECLOT AVGG Right Sep 14, 2012   Right thigh graft    MEDICATIONS: . protamine injection  25 mg   . acetaminophen (TYLENOL) 500 MG tablet  . gabapentin (NEURONTIN) 100 MG capsule  . HYDROcodone-acetaminophen (NORCO) 5-325 MG tablet  . IRON SUCROSE IV  . meperidine (DEMEROL) 50 MG tablet  . RENVELA 800 MG tablet  . SENSIPAR 30 MG tablet  . clindamycin (CLEOCIN) 300 MG capsule  . clopidogrel (PLAVIX) 75 MG tablet  . collagenase (SANTYL) ointment  . Doxercalciferol (HECTOROL) 2 MCG/ML SOLN  . HYDROcodone-acetaminophen (NORCO/VICODIN) 5-325 MG tablet  . Methoxy PEG-Epoetin Beta (MIRCERA) 75 MCG/0.3ML SOSY  . metroNIDAZOLE (FLAGYL) 500 MG tablet  . mupirocin ointment (BACTROBAN) 2 %  . nitroGLYCERIN (NITRO-DUR) 0.1 mg/hr patch  . oxyCODONE-acetaminophen (PERCOCET/ROXICET) 5-325 MG tablet  . silver sulfADIAZINE (SILVADENE) 1 % cream    If labs acceptable day of surgery, I anticipate pt can proceed with surgery as scheduled.  Rica Mast, FNP-BC [facility]/Anesthesiology Phone: [phone] 11/09/2017 9:40 AM

## 2017-11-09 NOTE — Progress Notes (Signed)
Left voice message with [name], Surgical Coordinator, to follow up with pt niece [name], regarding pre-op Aspirin and Plavix instructions.

## 2017-11-10 DIAGNOSIS — D509 Iron deficiency anemia, unspecified: Secondary | ICD-10-CM | POA: Diagnosis not present

## 2017-11-10 DIAGNOSIS — N186 End stage renal disease: Secondary | ICD-10-CM | POA: Diagnosis not present

## 2017-11-10 DIAGNOSIS — E1129 Type 2 diabetes mellitus with other diabetic kidney complication: Secondary | ICD-10-CM | POA: Diagnosis not present

## 2017-11-10 DIAGNOSIS — D631 Anemia in chronic kidney disease: Secondary | ICD-10-CM | POA: Diagnosis not present

## 2017-11-10 DIAGNOSIS — N2581 Secondary hyperparathyroidism of renal origin: Secondary | ICD-10-CM | POA: Diagnosis not present

## 2017-11-11 ENCOUNTER — Other Ambulatory Visit: Payer: Self-pay

## 2017-11-11 ENCOUNTER — Inpatient Hospital Stay (HOSPITAL_COMMUNITY): Payer: Medicare Other | Admitting: Emergency Medicine

## 2017-11-11 ENCOUNTER — Encounter (HOSPITAL_COMMUNITY)
Admission: RE | Disposition: A | Discharge status: Skilled Nursing Facility | Payer: Self-pay | Source: Home / Self Care | Attending: Orthopedic Surgery

## 2017-11-11 ENCOUNTER — Encounter (HOSPITAL_COMMUNITY): Payer: Self-pay

## 2017-11-11 ENCOUNTER — Inpatient Hospital Stay (HOSPITAL_COMMUNITY)
Admission: RE | Admit: 2017-11-11 | Discharge: 2017-11-15 | DRG: 239 | Disposition: A | Discharge status: Skilled Nursing Facility | Payer: Medicare Other | Attending: Orthopedic Surgery | Admitting: Orthopedic Surgery

## 2017-11-11 DIAGNOSIS — E1122 Type 2 diabetes mellitus with diabetic chronic kidney disease: Secondary | ICD-10-CM | POA: Diagnosis present

## 2017-11-11 DIAGNOSIS — Z87891 Personal history of nicotine dependence: Secondary | ICD-10-CM

## 2017-11-11 DIAGNOSIS — Z881 Allergy status to other antibiotic agents status: Secondary | ICD-10-CM | POA: Diagnosis not present

## 2017-11-11 DIAGNOSIS — D62 Acute posthemorrhagic anemia: Secondary | ICD-10-CM

## 2017-11-11 DIAGNOSIS — D638 Anemia in other chronic diseases classified elsewhere: Secondary | ICD-10-CM | POA: Diagnosis not present

## 2017-11-11 DIAGNOSIS — I739 Peripheral vascular disease, unspecified: Secondary | ICD-10-CM | POA: Diagnosis not present

## 2017-11-11 DIAGNOSIS — E1152 Type 2 diabetes mellitus with diabetic peripheral angiopathy with gangrene: Principal | ICD-10-CM | POA: Diagnosis present

## 2017-11-11 DIAGNOSIS — I96 Gangrene, not elsewhere classified: Secondary | ICD-10-CM

## 2017-11-11 DIAGNOSIS — Z89511 Acquired absence of right leg below knee: Secondary | ICD-10-CM | POA: Diagnosis not present

## 2017-11-11 DIAGNOSIS — N186 End stage renal disease: Secondary | ICD-10-CM | POA: Diagnosis not present

## 2017-11-11 DIAGNOSIS — Z833 Family history of diabetes mellitus: Secondary | ICD-10-CM | POA: Diagnosis not present

## 2017-11-11 DIAGNOSIS — L97514 Non-pressure chronic ulcer of other part of right foot with necrosis of bone: Secondary | ICD-10-CM | POA: Diagnosis present

## 2017-11-11 DIAGNOSIS — E11621 Type 2 diabetes mellitus with foot ulcer: Secondary | ICD-10-CM | POA: Diagnosis present

## 2017-11-11 DIAGNOSIS — Z6831 Body mass index (BMI) 31.0-31.9, adult: Secondary | ICD-10-CM

## 2017-11-11 DIAGNOSIS — Z885 Allergy status to narcotic agent status: Secondary | ICD-10-CM | POA: Diagnosis not present

## 2017-11-11 DIAGNOSIS — M2550 Pain in unspecified joint: Secondary | ICD-10-CM | POA: Diagnosis not present

## 2017-11-11 DIAGNOSIS — G8918 Other acute postprocedural pain: Secondary | ICD-10-CM | POA: Diagnosis not present

## 2017-11-11 DIAGNOSIS — N2581 Secondary hyperparathyroidism of renal origin: Secondary | ICD-10-CM | POA: Diagnosis present

## 2017-11-11 DIAGNOSIS — E119 Type 2 diabetes mellitus without complications: Secondary | ICD-10-CM | POA: Diagnosis not present

## 2017-11-11 DIAGNOSIS — Z8673 Personal history of transient ischemic attack (TIA), and cerebral infarction without residual deficits: Secondary | ICD-10-CM | POA: Diagnosis not present

## 2017-11-11 DIAGNOSIS — E1140 Type 2 diabetes mellitus with diabetic neuropathy, unspecified: Secondary | ICD-10-CM | POA: Diagnosis present

## 2017-11-11 DIAGNOSIS — D649 Anemia, unspecified: Secondary | ICD-10-CM | POA: Diagnosis not present

## 2017-11-11 DIAGNOSIS — I10 Essential (primary) hypertension: Secondary | ICD-10-CM | POA: Diagnosis not present

## 2017-11-11 DIAGNOSIS — S88111A Complete traumatic amputation at level between knee and ankle, right lower leg, initial encounter: Secondary | ICD-10-CM

## 2017-11-11 DIAGNOSIS — I70261 Atherosclerosis of native arteries of extremities with gangrene, right leg: Secondary | ICD-10-CM | POA: Diagnosis not present

## 2017-11-11 DIAGNOSIS — E669 Obesity, unspecified: Secondary | ICD-10-CM

## 2017-11-11 DIAGNOSIS — D631 Anemia in chronic kidney disease: Secondary | ICD-10-CM | POA: Diagnosis present

## 2017-11-11 DIAGNOSIS — Z992 Dependence on renal dialysis: Secondary | ICD-10-CM | POA: Diagnosis not present

## 2017-11-11 DIAGNOSIS — Z4781 Encounter for orthopedic aftercare following surgical amputation: Secondary | ICD-10-CM | POA: Diagnosis not present

## 2017-11-11 DIAGNOSIS — Z8614 Personal history of Methicillin resistant Staphylococcus aureus infection: Secondary | ICD-10-CM | POA: Diagnosis not present

## 2017-11-11 DIAGNOSIS — I120 Hypertensive chronic kidney disease with stage 5 chronic kidney disease or end stage renal disease: Secondary | ICD-10-CM | POA: Diagnosis present

## 2017-11-11 DIAGNOSIS — M6281 Muscle weakness (generalized): Secondary | ICD-10-CM | POA: Diagnosis not present

## 2017-11-11 DIAGNOSIS — E1169 Type 2 diabetes mellitus with other specified complication: Secondary | ICD-10-CM | POA: Diagnosis present

## 2017-11-11 DIAGNOSIS — IMO0002 Reserved for concepts with insufficient information to code with codable children: Secondary | ICD-10-CM

## 2017-11-11 DIAGNOSIS — L97519 Non-pressure chronic ulcer of other part of right foot with unspecified severity: Secondary | ICD-10-CM | POA: Diagnosis not present

## 2017-11-11 DIAGNOSIS — R488 Other symbolic dysfunctions: Secondary | ICD-10-CM | POA: Diagnosis not present

## 2017-11-11 DIAGNOSIS — Z7401 Bed confinement status: Secondary | ICD-10-CM | POA: Diagnosis not present

## 2017-11-11 HISTORY — PX: AMPUTATION: SHX166

## 2017-11-11 HISTORY — PX: BELOW KNEE LEG AMPUTATION: SUR23

## 2017-11-11 LAB — BASIC METABOLIC PANEL
ANION GAP: 10 (ref 5–15)
BUN: 21 mg/dL (ref 8–23)
CHLORIDE: 102 mmol/L (ref 98–111)
CO2: 27 mmol/L (ref 22–32)
Calcium: 9.2 mg/dL (ref 8.9–10.3)
Creatinine, Ser: 4.56 mg/dL — ABNORMAL HIGH (ref 0.44–1.00)
GFR calc Af Amer: 9 mL/min — ABNORMAL LOW (ref >60)
GFR, EST NON AFRICAN AMERICAN: 8 mL/min — AB (ref >60)
Glucose, Bld: 116 mg/dL — ABNORMAL HIGH (ref 70–99)
POTASSIUM: 3.6 mmol/L (ref 3.5–5.1)
Sodium: 139 mmol/L (ref 135–145)

## 2017-11-11 LAB — GLUCOSE, CAPILLARY
GLUCOSE-CAPILLARY: 99 mg/dL (ref 70–99)
GLUCOSE-CAPILLARY: 96 mg/dL (ref 70–99)
Glucose-Capillary: 115 mg/dL — ABNORMAL HIGH (ref 70–99)

## 2017-11-11 LAB — CBC
HEMATOCRIT: 33.1 % — AB (ref 36.0–46.0)
HEMOGLOBIN: 9.4 g/dL — AB (ref 12.0–15.0)
MCH: 21.8 pg — ABNORMAL LOW (ref 26.0–34.0)
MCHC: 28.4 g/dL — AB (ref 30.0–36.0)
MCV: 76.6 fL — AB (ref 78.0–100.0)
Platelets: 354 10*3/uL (ref 150–400)
RBC: 4.32 MIL/uL (ref 3.87–5.11)
RDW: 19.9 % — AB (ref 11.5–15.5)
WBC: 11.3 10*3/uL — ABNORMAL HIGH (ref 4.0–10.5)

## 2017-11-11 LAB — SURGICAL PCR SCREEN
MRSA, PCR: NEGATIVE
STAPHYLOCOCCUS AUREUS: NEGATIVE

## 2017-11-11 LAB — HEMOGLOBIN A1C
Hgb A1c MFr Bld: 5.7 % — ABNORMAL HIGH (ref 4.8–5.6)
MEAN PLASMA GLUCOSE: 116.89 mg/dL

## 2017-11-11 SURGERY — AMPUTATION BELOW KNEE
Anesthesia: General | Laterality: Right

## 2017-11-11 MED ORDER — METOCLOPRAMIDE HCL 5 MG PO TABS: 5.0000 mg | ORAL_TABLET | Freq: Three times a day (TID) | ORAL | Status: DC | PRN

## 2017-11-11 MED ORDER — METHOCARBAMOL 1000 MG/10ML IJ SOLN
500.0000 mg | Freq: Four times a day (QID) | INTRAVENOUS | Status: DC | PRN
  Filled 2017-11-11: qty 5

## 2017-11-11 MED ORDER — CHLORHEXIDINE GLUCONATE CLOTH 2 % EX PADS
6.0000 | MEDICATED_PAD | Freq: Every day | CUTANEOUS | Status: DC
  Administered 2017-11-12 – 2017-11-13 (×2): 6 via TOPICAL

## 2017-11-11 MED ORDER — HYDROMORPHONE HCL 1 MG/ML IJ SOLN
0.5000 mg | INTRAMUSCULAR | Status: DC | PRN
  Administered 2017-11-13: 1 mg via INTRAVENOUS
  Filled 2017-11-11: qty 1

## 2017-11-11 MED ORDER — METOCLOPRAMIDE HCL 5 MG/ML IJ SOLN: 5.0000 mg | Freq: Three times a day (TID) | INTRAMUSCULAR | Status: DC | PRN

## 2017-11-11 MED ORDER — CEFAZOLIN SODIUM-DEXTROSE 1-4 GM/50ML-% IV SOLN
1.0000 g | INTRAVENOUS | Status: AC
  Administered 2017-11-11: 1 g via INTRAVENOUS
  Filled 2017-11-11: qty 50

## 2017-11-11 MED ORDER — PROPOFOL 10 MG/ML IV BOLUS
INTRAVENOUS | Status: DC | PRN
  Administered 2017-11-11: 200 mg via INTRAVENOUS

## 2017-11-11 MED ORDER — PHENYLEPHRINE HCL 10 MG/ML IJ SOLN
INTRAMUSCULAR | Status: DC | PRN
  Administered 2017-11-11: 80 ug via INTRAVENOUS

## 2017-11-11 MED ORDER — OXYCODONE HCL 5 MG PO TABS
5.0000 mg | ORAL_TABLET | ORAL | Status: DC | PRN
  Administered 2017-11-12: 10 mg via ORAL
  Filled 2017-11-11: qty 2
  Filled 2017-11-11: qty 1

## 2017-11-11 MED ORDER — MAGNESIUM CITRATE PO SOLN: 1.0000 | Freq: Once | ORAL | Status: DC | PRN

## 2017-11-11 MED ORDER — ASPIRIN 325 MG PO TABS
325.0000 mg | ORAL_TABLET | Freq: Every day | ORAL | Status: DC
  Administered 2017-11-12 – 2017-11-15 (×4): 325 mg via ORAL
  Filled 2017-11-11 (×4): qty 1

## 2017-11-11 MED ORDER — PHENYLEPHRINE 40 MCG/ML (10ML) SYRINGE FOR IV PUSH (FOR BLOOD PRESSURE SUPPORT)
PREFILLED_SYRINGE | INTRAVENOUS | Status: AC
  Filled 2017-11-11: qty 10

## 2017-11-11 MED ORDER — ONDANSETRON HCL 4 MG/2ML IJ SOLN
INTRAMUSCULAR | Status: AC
  Filled 2017-11-11: qty 2

## 2017-11-11 MED ORDER — 0.9 % SODIUM CHLORIDE (POUR BTL) OPTIME
TOPICAL | Status: DC | PRN
  Administered 2017-11-11: 1000 mL

## 2017-11-11 MED ORDER — SODIUM CHLORIDE 0.9 % IV SOLN: 100.0000 mL | INTRAVENOUS | Status: DC | PRN

## 2017-11-11 MED ORDER — METHOCARBAMOL 500 MG PO TABS
500.0000 mg | ORAL_TABLET | Freq: Four times a day (QID) | ORAL | Status: DC | PRN
  Administered 2017-11-12 – 2017-11-14 (×2): 500 mg via ORAL
  Filled 2017-11-11 (×2): qty 1

## 2017-11-11 MED ORDER — DEXAMETHASONE SODIUM PHOSPHATE 10 MG/ML IJ SOLN
INTRAMUSCULAR | Status: AC
  Filled 2017-11-11: qty 1

## 2017-11-11 MED ORDER — FENTANYL CITRATE (PF) 250 MCG/5ML IJ SOLN
INTRAMUSCULAR | Status: AC
  Filled 2017-11-11: qty 5

## 2017-11-11 MED ORDER — FENTANYL CITRATE (PF) 100 MCG/2ML IJ SOLN
INTRAMUSCULAR | Status: DC | PRN
  Administered 2017-11-11 (×3): 50 ug via INTRAVENOUS

## 2017-11-11 MED ORDER — CEFAZOLIN SODIUM-DEXTROSE 2-4 GM/100ML-% IV SOLN
INTRAVENOUS | Status: AC
  Filled 2017-11-11: qty 100

## 2017-11-11 MED ORDER — PENTAFLUOROPROP-TETRAFLUOROETH EX AERO: 1.0000 "application " | INHALATION_SPRAY | CUTANEOUS | Status: DC | PRN

## 2017-11-11 MED ORDER — ONDANSETRON HCL 4 MG/2ML IJ SOLN: 4.0000 mg | Freq: Once | INTRAMUSCULAR | Status: DC | PRN

## 2017-11-11 MED ORDER — ONDANSETRON HCL 4 MG/2ML IJ SOLN
INTRAMUSCULAR | Status: DC | PRN
  Administered 2017-11-11: 4 mg via INTRAVENOUS

## 2017-11-11 MED ORDER — ONDANSETRON HCL 4 MG/2ML IJ SOLN: 4.0000 mg | Freq: Four times a day (QID) | INTRAMUSCULAR | Status: DC | PRN

## 2017-11-11 MED ORDER — ACETAMINOPHEN 325 MG PO TABS
325.0000 mg | ORAL_TABLET | Freq: Four times a day (QID) | ORAL | Status: DC | PRN
  Administered 2017-11-12: 650 mg via ORAL
  Filled 2017-11-11: qty 2

## 2017-11-11 MED ORDER — OXYCODONE HCL 5 MG PO TABS
10.0000 mg | ORAL_TABLET | ORAL | Status: DC | PRN
  Administered 2017-11-12 – 2017-11-14 (×6): 10 mg via ORAL
  Administered 2017-11-15 (×2): 15 mg via ORAL
  Filled 2017-11-11 (×3): qty 2
  Filled 2017-11-11: qty 3
  Filled 2017-11-11 (×4): qty 2

## 2017-11-11 MED ORDER — ALTEPLASE 2 MG IJ SOLR
2.0000 mg | Freq: Once | INTRAMUSCULAR | Status: DC | PRN
  Filled 2017-11-11: qty 2

## 2017-11-11 MED ORDER — SODIUM CHLORIDE 0.9 % IV SOLN
INTRAVENOUS | Status: DC
  Administered 2017-11-11: 07:00:00 via INTRAVENOUS

## 2017-11-11 MED ORDER — BISACODYL 10 MG RE SUPP: 10.0000 mg | Freq: Every day | RECTAL | Status: DC | PRN

## 2017-11-11 MED ORDER — SODIUM CHLORIDE 0.9 % IV SOLN
INTRAVENOUS | Status: DC
  Administered 2017-11-11 – 2017-11-12 (×2): via INTRAVENOUS

## 2017-11-11 MED ORDER — DOCUSATE SODIUM 100 MG PO CAPS
100.0000 mg | ORAL_CAPSULE | Freq: Two times a day (BID) | ORAL | Status: DC
  Administered 2017-11-11 – 2017-11-15 (×8): 100 mg via ORAL
  Filled 2017-11-11 (×8): qty 1

## 2017-11-11 MED ORDER — DOXERCALCIFEROL 4 MCG/2ML IV SOLN
4.0000 ug | INTRAVENOUS | Status: DC
  Filled 2017-11-11: qty 2

## 2017-11-11 MED ORDER — LIDOCAINE HCL (PF) 1 % IJ SOLN: 5.0000 mL | INTRAMUSCULAR | Status: DC | PRN

## 2017-11-11 MED ORDER — LIDOCAINE HCL (CARDIAC) PF 100 MG/5ML IV SOSY
PREFILLED_SYRINGE | INTRAVENOUS | Status: DC | PRN
  Administered 2017-11-11: 60 mg via INTRAVENOUS

## 2017-11-11 MED ORDER — FENTANYL CITRATE (PF) 100 MCG/2ML IJ SOLN: 25.0000 ug | INTRAMUSCULAR | Status: DC | PRN

## 2017-11-11 MED ORDER — HEPARIN SODIUM (PORCINE) 1000 UNIT/ML DIALYSIS
1000.0000 [IU] | INTRAMUSCULAR | Status: DC | PRN
  Filled 2017-11-11: qty 1

## 2017-11-11 MED ORDER — DEXAMETHASONE SODIUM PHOSPHATE 10 MG/ML IJ SOLN
INTRAMUSCULAR | Status: DC | PRN
  Administered 2017-11-11: 4 mg via INTRAVENOUS

## 2017-11-11 MED ORDER — PROPOFOL 10 MG/ML IV BOLUS
INTRAVENOUS | Status: AC
  Filled 2017-11-11: qty 20

## 2017-11-11 MED ORDER — POLYETHYLENE GLYCOL 3350 17 G PO PACK: 17.0000 g | PACK | Freq: Every day | ORAL | Status: DC | PRN

## 2017-11-11 MED ORDER — CEFAZOLIN SODIUM-DEXTROSE 2-4 GM/100ML-% IV SOLN
2.0000 g | INTRAVENOUS | Status: AC
  Administered 2017-11-11: 2 g via INTRAVENOUS

## 2017-11-11 MED ORDER — LIDOCAINE-PRILOCAINE 2.5-2.5 % EX CREA
1.0000 "application " | TOPICAL_CREAM | CUTANEOUS | Status: DC | PRN
  Filled 2017-11-11: qty 5

## 2017-11-11 MED ORDER — CHLORHEXIDINE GLUCONATE 4 % EX LIQD: 60.0000 mL | Freq: Once | CUTANEOUS | Status: DC

## 2017-11-11 MED ORDER — ONDANSETRON HCL 4 MG PO TABS: 4.0000 mg | ORAL_TABLET | Freq: Four times a day (QID) | ORAL | Status: DC | PRN

## 2017-11-11 SURGICAL SUPPLY — 40 items
APL SKNCLS STERI-STRIP NONHPOA (GAUZE/BANDAGES/DRESSINGS) ×4
BENZOIN TINCTURE PRP APPL 2/3 (GAUZE/BANDAGES/DRESSINGS) ×12 IMPLANT
BLADE SAW RECIP 87.9 MT (BLADE) ×3 IMPLANT
BLADE SURG 21 STRL SS (BLADE) ×3 IMPLANT
BNDG COHESIVE 6X5 TAN STRL LF (GAUZE/BANDAGES/DRESSINGS) ×4 IMPLANT
BNDG GAUZE ELAST 4 BULKY (GAUZE/BANDAGES/DRESSINGS) ×6 IMPLANT
COVER SURGICAL LIGHT HANDLE (MISCELLANEOUS) ×3 IMPLANT
CUFF TOURNIQUET SINGLE 34IN LL (TOURNIQUET CUFF) IMPLANT
CUFF TOURNIQUET SINGLE 44IN (TOURNIQUET CUFF) IMPLANT
DRAPE INCISE IOBAN 66X45 STRL (DRAPES) ×2 IMPLANT
DRAPE U-SHAPE 47X51 STRL (DRAPES) ×3 IMPLANT
DRESSING PREVENA PLUS CUSTOM (GAUZE/BANDAGES/DRESSINGS) ×1 IMPLANT
DRSG PREVENA PLUS CUSTOM (GAUZE/BANDAGES/DRESSINGS) ×3
DURAPREP 26ML APPLICATOR (WOUND CARE) ×3 IMPLANT
ELECT REM PT RETURN 9FT ADLT (ELECTROSURGICAL) ×3
ELECTRODE REM PT RTRN 9FT ADLT (ELECTROSURGICAL) ×1 IMPLANT
GLOVE BIOGEL PI IND STRL 9 (GLOVE) ×1 IMPLANT
GLOVE BIOGEL PI INDICATOR 9 (GLOVE)
GLOVE SURG ORTHO 9.0 STRL STRW (GLOVE) ×3 IMPLANT
GLOVE SURG SS PI 8.0 STRL IVOR (GLOVE) ×2 IMPLANT
GOWN STRL REUS W/ TWL XL LVL3 (GOWN DISPOSABLE) ×2 IMPLANT
GOWN STRL REUS W/TWL XL LVL3 (GOWN DISPOSABLE) ×6
KIT BASIN OR (CUSTOM PROCEDURE TRAY) ×3 IMPLANT
KIT TURNOVER KIT B (KITS) ×3 IMPLANT
MANIFOLD NEPTUNE II (INSTRUMENTS) ×3 IMPLANT
NS IRRIG 1000ML POUR BTL (IV SOLUTION) ×3 IMPLANT
PACK ORTHO EXTREMITY (CUSTOM PROCEDURE TRAY) ×3 IMPLANT
PAD ARMBOARD 7.5X6 YLW CONV (MISCELLANEOUS) ×3 IMPLANT
SPONGE LAP 18X18 X RAY DECT (DISPOSABLE) ×4 IMPLANT
STAPLER VISISTAT 35W (STAPLE) IMPLANT
STOCKINETTE IMPERVIOUS LG (DRAPES) ×3 IMPLANT
SUT ETHILON 2 0 PSLX (SUTURE) ×6 IMPLANT
SUT SILK 2 0 (SUTURE) ×3
SUT SILK 2-0 18XBRD TIE 12 (SUTURE) ×1 IMPLANT
SUT VIC AB 1 CTX 27 (SUTURE) ×4 IMPLANT
TOWEL OR 17X26 10 PK STRL BLUE (TOWEL DISPOSABLE) ×3 IMPLANT
TUBE CONNECTING 12'X1/4 (SUCTIONS) ×1
TUBE CONNECTING 12X1/4 (SUCTIONS) ×2 IMPLANT
WND VAC CANISTER 500ML (MISCELLANEOUS) ×2 IMPLANT
YANKAUER SUCT BULB TIP NO VENT (SUCTIONS) ×3 IMPLANT

## 2017-11-11 NOTE — Op Note (Signed)
   Date of Surgery: 11/11/2017  INDICATIONS: [name] is a 84 y.o.-year-old female who has failed limb salvage intervention with gangrenous changes to the right foot she has insensate neuropathy end-stage renal disease on dialysis.Marland Kitchen.  PREOPERATIVE DIAGNOSIS: Gangrene right foot  POSTOPERATIVE DIAGNOSIS: Same.  PROCEDURE: Transtibial amputation Application of Prevena wound VAC  SURGEON: Lajoyce Cornersuda, M.D.  ANESTHESIA:  general  IV FLUIDS AND URINE: See anesthesia.  ESTIMATED BLOOD LOSS: Minimal mL.  COMPLICATIONS: None.  DESCRIPTION OF PROCEDURE: The patient was brought to the operating room and underwent a general anesthetic. After adequate levels of anesthesia were obtained patient's lower extremity was prepped using DuraPrep draped into a sterile field. A timeout was called. The foot was draped out of the sterile field with impervious stockinette. A transverse incision was made 11 cm distal to the tibial tubercle. This curved proximally and a large posterior flap was created. The tibia was transected 1 cm proximal to the skin incision. The fibula was transected just proximal to the tibial incision. The tibia was beveled anteriorly. A large posterior flap was created. The sciatic nerve was pulled cut and allowed to retract. The vascular bundles were suture ligated with 2-0 silk. The deep and superficial fascial layers were closed using #1 Vicryl. The skin was closed using staples and 2-0 nylon. The wound was covered with a Prevena wound VAC. There was a good suction fit. A prosthetic shrinker was applied. Patient was extubated taken to the PACU in stable condition.   DISCHARGE PLANNING:  Antibiotic duration: 24 hours postoperatively  Weightbearing: Nonweightbearing on the right  Pain medication: High-dose opiate scale ordered  Dressing care/ Wound VAC: Continue wound VAC for 1 week  Discharge to: Skilled nursing facility.  Follow-up: In the office 1 week post operative.  Aldean BakerMarcus Duda,  MD [facility] 11:37 AM

## 2017-11-11 NOTE — Transfer of Care (Signed)
Immediate Anesthesia Transfer of Care Note  Patient: [name]  Procedure(s) Performed: RIGHT BELOW KNEE AMPUTATION (Right )  Patient Location: PACU  Anesthesia Type:General  Level of Consciousness: awake  Airway & Oxygen Therapy: Patient Spontanous Breathing and Patient connected to nasal cannula oxygen  Post-op Assessment: Report given to RN, Post -op Vital signs reviewed and stable and Patient moving all extremities X 4  Post vital signs: Reviewed and stable  Last Vitals:  Vitals Value Taken Time  BP    Temp    Pulse    Resp    SpO2      Last Pain:  Vitals:   11/11/17 1308  TempSrc: Oral  PainSc:          Complications: No apparent anesthesia complications

## 2017-11-11 NOTE — Anesthesia Procedure Notes (Signed)
Procedure Name: LMA Insertion Date/Time: 11/11/2017 11:09 AM Performed by: Epifanio LeschesMercer, Ashana Tullo L, CRNA Pre-anesthesia Checklist: Patient identified, Emergency Drugs available, Suction available and Patient being monitored Patient Re-evaluated:Patient Re-evaluated prior to induction Oxygen Delivery Method: Circle System Utilized Preoxygenation: Pre-oxygenation with 100% oxygen Induction Type: IV induction Ventilation: Mask ventilation without difficulty LMA: LMA with gastric port inserted LMA Size: 4.0 Number of attempts: 2 Airway Equipment and Method: Bite block Placement Confirmation: positive ETCO2 Tube secured with: Tape Dental Injury: Teeth and Oropharynx as per pre-operative assessment

## 2017-11-11 NOTE — Progress Notes (Signed)
Orthopedic Tech Progress Note Patient Details:  Jamie LippsBetty G [name] 11/19/1932 [id number]  Patient ID: Jamie LippsBetty G [name], female   DOB: 02/11/1933, 84 y.o.   MRN: [id number]   [name] 11/11/2017, 2:25 PMCalled [facility] for right stump shrinker, limb protector.

## 2017-11-11 NOTE — H&P (Signed)
[name] is an 84 y.o. female.   Chief Complaint: Black gangrenous changes right foot second toe HPI: Patient is a 84 year old woman who is status post right foot first ray amputation.  Patient has had progressive dehiscence with gangrenous changes to the second toe.  She has undergone vascular work-up and is not felt to be a revascularization candidate by Dr. Johny Drillinghan.  She is seen today for evaluation and treatment.  She is on dialysis Tuesday Thursday and Saturday.    Past Medical History:  Diagnosis Date  . Anemia   . C. difficile diarrhea   . Chronic kidney disease    on Dialysis  . Depression   . Diabetes mellitus   . Diabetic neuropathy (HCC)   . Gangrene (HCC)    right foot  . GERD (gastroesophageal reflux disease)   . Gout   . Hyperparathyroidism   . MRSA (methicillin resistant staph aureus) culture positive   . Obesity   . Osteoarthritis   . Peripheral arterial disease (HCC)    Gangrene-Left Great Toe  . Shortness of breath dyspnea    with exertion  . Stroke [facility](HCC)     Past Surgical History:  Procedure Laterality Date  . ABDOMINAL AORTOGRAM N/A 07/05/2016   Procedure: Abdominal Aortogram;  Surgeon: Maeola HarmanBrandon Christopher Cain, MD;  [facility];  Service: Cardiovascular;  Laterality: N/A;  . ABDOMINAL AORTOGRAM N/A 06/24/2017   Procedure: ABDOMINAL AORTOGRAM;  Surgeon: Sherren KernsFields, Charles E, MD;  [facility];  Service: Cardiovascular;  Laterality: N/A;  . ABDOMINAL HYSTERECTOMY    . AV FISTULA PLACEMENT Left 07/07/2015   Procedure: ATTEMPTED INSERTION OFLEFT  ARTERIOVENOUS (AV) GORE-TEX GRAFT THIGH;  Surgeon: Fransisco HertzBrian L Chen, MD;  Location: [facility] OR;  Service: Vascular;  Laterality: Left;  . BREAST EXCISIONAL BIOPSY Right 05/25/2006  . COLONOSCOPY    . LOWER EXTREMITY ANGIOGRAPHY Bilateral 07/05/2016   Procedure: Lower Extremity Angiography;  Surgeon: Maeola HarmanBrandon Christopher Cain, MD;  [facility];  Service: Cardiovascular;   Laterality: Bilateral;  . LOWER EXTREMITY ANGIOGRAPHY Bilateral 06/24/2017   Procedure: Lower Extremity Angiography;  Surgeon: Sherren KernsFields, Charles E, MD;  [facility];  Service: Cardiovascular;  Laterality: Bilateral;  . MULTIPLE TOOTH EXTRACTIONS    . PERIPHERAL VASCULAR INTERVENTION Right 06/24/2017   Procedure: PERIPHERAL VASCULAR INTERVENTION;  Surgeon: Sherren KernsFields, Charles E, MD;  [facility];  Service: Cardiovascular;  Laterality: Right;  POPLITEALPTA SFA STENT  . SHUNTOGRAM Right 09/06/12   Thigh graft  . SP DECLOT AVGG Right Sep 14, 2012   Right thigh graft    Family History  Problem Relation Age of Onset  . Cancer Father        type unknown  . Stroke Mother   . Diabetes Sister   . Cancer Brother        type unknown   Social History:  reports that she has quit smoking. Her smoking use included cigarettes. She has never used smokeless tobacco. She reports that she does not drink alcohol or use drugs.  Allergies:  Allergies  Allergen Reactions  . Codeine Hives  . Vancomycin Rash    Facility-Administered Medications Prior to Admission  Medication Dose Route Frequency Provider Last Rate Last Dose  . protamine injection 25 mg  25 mg Intravenous Once Sherren KernsFields, Charles E, MD       Medications Prior to Admission  Medication Sig Dispense Refill  . acetaminophen (TYLENOL) 500 MG tablet Take 1,000 mg by  mouth every 6 (six) hours as needed for mild pain or headache.     . Doxercalciferol (HECTOROL) 2 MCG/ML SOLN Inject 3 mcg into the vein 3 (three) times a week.    . gabapentin (NEURONTIN) 100 MG capsule Take 100 mg by mouth at bedtime.     Marland Kitchen HYDROcodone-acetaminophen (NORCO) 5-325 MG tablet Take 1 tablet by mouth every 6 (six) hours as needed for moderate pain. 20 tablet 0  . IRON SUCROSE IV Inject 1 Dose into the vein once a week.    . meperidine (DEMEROL) 50 MG tablet Take 1 tablet (50 mg total) by mouth every 6 (six) hours as needed. 10 tablet 0  .  Methoxy PEG-Epoetin Beta (MIRCERA) 75 MCG/0.3ML SOSY Inject 75 mcg as directed every 14 (fourteen) days.    Marland Kitchen RENVELA 800 MG tablet Take 1,600-2,400 mg by mouth See admin instructions. Take 2400 mg by mouth 3 times daily with meals and take 1600 mg by mouth with snacks    . SENSIPAR 30 MG tablet Take 30 mg by mouth every evening.     . clindamycin (CLEOCIN) 300 MG capsule Take 1 capsule (300 mg total) by mouth 3 (three) times daily. (Patient not taking: Reported on 11/09/2017) 30 capsule 0  . clopidogrel (PLAVIX) 75 MG tablet Take 1 tablet (75 mg total) by mouth daily. (Patient not taking: Reported on 11/09/2017) 30 tablet 11  . collagenase (SANTYL) ointment Apply 1 application topically daily. (Patient not taking: Reported on 11/09/2017) 15 g 5  . HYDROcodone-acetaminophen (NORCO/VICODIN) 5-325 MG tablet Take 1 tablet by mouth every 4 (four) hours as needed. (Patient not taking: Reported on 11/09/2017) 20 tablet 0  . metroNIDAZOLE (FLAGYL) 500 MG tablet Take 1 tablet (500 mg total) by mouth 2 (two) times daily. (Patient not taking: Reported on 11/09/2017) 60 tablet 1  . mupirocin ointment (BACTROBAN) 2 % Apply 1 application topically 2 (two) times daily. (Patient not taking: Reported on 11/09/2017) 30 g 2  . nitroGLYCERIN (NITRO-DUR) 0.1 mg/hr patch Remove prior patch and apply 0.1 mg patch to R foot daily. (Patient not taking: Reported on 11/09/2017) 30 patch 1  . oxyCODONE-acetaminophen (PERCOCET/ROXICET) 5-325 MG tablet Take 1 tablet by mouth every 6 (six) hours as needed. 10 tablet 0  . silver sulfADIAZINE (SILVADENE) 1 % cream Apply 1 application topically daily. (Patient not taking: Reported on 11/09/2017) 50 g 0    Results for orders placed or performed during the hospital encounter of 11/11/17 (from the past 48 hour(s))  Glucose, capillary     Status: Abnormal   Collection Time: 11/11/17  6:47 AM  Result Value Ref Range   Glucose-Capillary 115 (H) 70 - 99 mg/dL  CBC     Status: Abnormal   Collection  Time: 11/11/17  6:48 AM  Result Value Ref Range   WBC 11.3 (H) 4.0 - 10.5 K/uL   RBC 4.32 3.87 - 5.11 MIL/uL   Hemoglobin 9.4 (L) 12.0 - 15.0 g/dL   HCT 82.9 (L) 56.2 - 13.0 %   MCV 76.6 (L) 78.0 - 100.0 fL   MCH 21.8 (L) 26.0 - 34.0 pg   MCHC 28.4 (L) 30.0 - 36.0 g/dL   RDW 86.5 (H) 78.4 - 69.6 %   Platelets 354 150 - 400 K/uL    Comment: Performed at [facility], [address], [place], [place] [address]  Hemoglobin A1c     Status: Abnormal   Collection Time: 11/11/17  6:49 AM  Result Value Ref Range  Hgb A1c MFr Bld 5.7 (H) 4.8 - 5.6 %    Comment: (NOTE) Pre diabetes:          5.7%-6.4% Diabetes:              >6.4% Glycemic control for   <7.0% adults with diabetes    Mean Plasma Glucose 116.89 mg/dL    Comment: Performed at [facility], [address] [place], [place] [address]   No results found.  Review of Systems  All other systems reviewed and are negative.   Height 5\' 2"  (1.575 m), weight 180 lb (81.6 kg). Physical Exam  Patient is alert, oriented, no adenopathy, well-dressed, normal affect, normal respiratory effort. Examination patient does not have a palpable pulse she does have venous swelling she has a necrotic wound from the first ray amputation with dry gangrenous changes of the second toe.  She has decreased turgor in her heel pad with decreased circulation to the entire foot.    Assessment/Plan 1. Gangrene of right foot (HCC)     Plan: Discussed with the patient her best option would be to proceed with a transtibial amputation risks and benefits of surgery were discussed including risk of the wound not healing need for additional surgery.  Patient states she understands wishes to proceed next week.  We will plan for surgery on Friday with dialysis in the hospital plan for discharge to skilled nursing.     Nadara Mustard, MD 11/11/2017, 7:28 AM

## 2017-11-11 NOTE — Anesthesia Preprocedure Evaluation (Addendum)
Anesthesia Evaluation  Patient identified by MRN, date of birth, ID band Patient awake    Reviewed: Allergy & Precautions, NPO status , Patient's Chart, lab work & pertinent test results  Airway Mallampati: II  TM Distance: >3 FB Neck ROM: Full    Dental  (+) Edentulous Upper, Edentulous Lower   Pulmonary former smoker,    Pulmonary exam normal breath sounds clear to auscultation       Cardiovascular + Peripheral Vascular Disease  negative cardio ROS Normal cardiovascular exam Rhythm:Regular Rate:Normal  ECG: NSR, rate 79   Neuro/Psych PSYCHIATRIC DISORDERS Depression CVA (left sided weakness), Residual Symptoms    GI/Hepatic negative GI ROS, Neg liver ROS,   Endo/Other  diabetes  Renal/GU ESRF and DialysisRenal disease (ON HD T, R, Sat)     Musculoskeletal Gout   Abdominal (+) + obese,   Peds  Hematology  (+) anemia ,   Anesthesia Other Findings Gangrene Right Foot  Reproductive/Obstetrics                            Anesthesia Physical Anesthesia Plan  ASA: IV  Anesthesia Plan: General   Post-op Pain Management:    Induction: Intravenous  PONV Risk Score and Plan: 3 and Ondansetron, Dexamethasone and Treatment may vary due to age or medical condition  Airway Management Planned: LMA  Additional Equipment:   Intra-op Plan:   Post-operative Plan: Extubation in OR  Informed Consent: I have reviewed the patients History and Physical, chart, labs and discussed the procedure including the risks, benefits and alternatives for the proposed anesthesia with the patient or authorized representative who has indicated his/her understanding and acceptance.   Dental advisory given  Plan Discussed with: CRNA  Anesthesia Plan Comments: [place])       Anesthesia Quick Evaluation

## 2017-11-11 NOTE — Progress Notes (Signed)
PHARMACY NOTE:  ANTIMICROBIAL RENAL DOSAGE ADJUSTMENT  Current antimicrobial regimen includes a mismatch between antimicrobial dosage and estimated renal function.  As per policy approved by the Pharmacy & Therapeutics and Medical Executive Committees, the antimicrobial dosage will be adjusted accordingly.  Current antimicrobial dosage:  Cefazolin 1gm IV q6h x 3  Indication: Surgical prophylaxis  Renal Function:  Estimated Creatinine Clearance: 9.3 mL/min (A) (by C-G formula based on SCr of 4.56 mg/dL (H)). [x]      On intermittent HD, scheduled: []      On CRRT    Antimicrobial dosage has been changed to:  Cefazolin 1gm IV q24h x1  Additional comments:   Thank you for allowing pharmacy to be a part of this patient's care.  Makenli Derstine A. Jeanella CrazePierce, PharmD, BCPS Clinical Pharmacist [place] Pager: [phone] Please utilize Amion for appropriate phone number to reach the unit pharmacist [facility])   11/11/2017 1:12 PM

## 2017-11-11 NOTE — Consult Note (Signed)
Reason for Consult: Continuity of ESRD care Referring Physician: Aldean BakerMarcus Duda, MD (orthopedic surgery)  HPI:  84 year old African-American woman with past medical history significant for type 2 diabetes mellitus with neuropathy, peripheral vascular disease, osteoarthritis, prior history of CVA and a history of end-stage renal disease on hemodialysis (TTS-GOC).  Admitted to the hospital today for right below-knee amputation for management of right foot gangrene after failing salvage measures.  She went to her usual hemodialysis treatment yesterday that she tolerated without problems.  When seen in the PACU, her only complaint is that of "feeling hot".  She denies any preceding fevers or chills and does not have any current nausea or vomiting.  Hemodialysis prescription: GOC, TTS, 4 hours, 180 dialyzer, BFR 400, DFR 800, EDW 80 kg, 2K/2.25 calcium, linear sodium modeling, UF profile #2, right IJ TDC.  Past Medical History:  Diagnosis Date  . Anemia   . C. difficile diarrhea   . Chronic kidney disease    on Dialysis  . Depression   . Diabetes mellitus   . Diabetic neuropathy (HCC)   . Gangrene (HCC)    right foot  . GERD (gastroesophageal reflux disease)   . Gout   . Hyperparathyroidism   . MRSA (methicillin resistant staph aureus) culture positive   . Obesity   . Osteoarthritis   . Peripheral arterial disease (HCC)    Gangrene-Left Great Toe  . Shortness of breath dyspnea    with exertion  . Stroke [facility](HCC)     Past Surgical History:  Procedure Laterality Date  . ABDOMINAL AORTOGRAM N/A 07/05/2016   Procedure: Abdominal Aortogram;  Surgeon: Maeola HarmanBrandon Christopher Cain, MD;  [facility];  Service: Cardiovascular;  Laterality: N/A;  . ABDOMINAL AORTOGRAM N/A 06/24/2017   Procedure: ABDOMINAL AORTOGRAM;  Surgeon: Sherren KernsFields, Charles E, MD;  [facility];  Service: Cardiovascular;  Laterality: N/A;  . ABDOMINAL HYSTERECTOMY    . AV FISTULA PLACEMENT Left  07/07/2015   Procedure: ATTEMPTED INSERTION OFLEFT  ARTERIOVENOUS (AV) GORE-TEX GRAFT THIGH;  Surgeon: Fransisco HertzBrian L Chen, MD;  Location: [facility];  Service: Vascular;  Laterality: Left;  . BREAST EXCISIONAL BIOPSY Right 05/25/2006  . COLONOSCOPY    . LOWER EXTREMITY ANGIOGRAPHY Bilateral 07/05/2016   Procedure: Lower Extremity Angiography;  Surgeon: Maeola HarmanBrandon Christopher Cain, MD;  [facility];  Service: Cardiovascular;  Laterality: Bilateral;  . LOWER EXTREMITY ANGIOGRAPHY Bilateral 06/24/2017   Procedure: Lower Extremity Angiography;  Surgeon: Sherren KernsFields, Charles E, MD;  [facility];  Service: Cardiovascular;  Laterality: Bilateral;  . MULTIPLE TOOTH EXTRACTIONS    . PERIPHERAL VASCULAR INTERVENTION Right 06/24/2017   Procedure: PERIPHERAL VASCULAR INTERVENTION;  Surgeon: Sherren KernsFields, Charles E, MD;  [facility];  Service: Cardiovascular;  Laterality: Right;  POPLITEALPTA SFA STENT  . SHUNTOGRAM Right 09/06/12   Thigh graft  . SP DECLOT AVGG Right Sep 14, 2012   Right thigh graft    Family History  Problem Relation Age of Onset  . Cancer Father        type unknown  . Stroke Mother   . Diabetes Sister   . Cancer Brother        type unknown    Social History:  reports that she has quit smoking. Her smoking use included cigarettes. She has never used smokeless tobacco. She reports that she does not drink alcohol or use drugs.  Allergies:  Allergies  Allergen Reactions  . Codeine Hives  . Vancomycin Rash  Medications:  Scheduled: . aspirin  325 mg Oral Daily  . chlorhexidine  60 mL Topical Once  . [START ON 11/12/2017] Chlorhexidine Gluconate Cloth  6 each Topical Q0600  . docusate sodium  100 mg Oral BID  . [START ON 11/12/2017] doxercalciferol  4 mcg Intravenous Q T,Th,Sa-HD    BMP Latest Ref Rng & Units 11/11/2017 06/24/2017 07/05/2016  Glucose 70 - 99 mg/dL 161(W) 960(A) 96  BUN 8 - 23 mg/dL 21 54(U) 98(J)  Creatinine 0.44 - 1.00 mg/dL 1.91(Y)  7.82(N) 5.62(Z)  Sodium 135 - 145 mmol/L 139 141 139  Potassium 3.5 - 5.1 mmol/L 3.6 4.4 4.2  Chloride 98 - 111 mmol/L 102 99(L) 99(L)  CO2 22 - 32 mmol/L 27 - -  Calcium 8.9 - 10.3 mg/dL 9.2 - -   CBC Latest Ref Rng & Units 11/11/2017 06/24/2017 07/05/2016  WBC 4.0 - 10.5 K/uL 11.3(H) - -  Hemoglobin 12.0 - 15.0 g/dL 3.0(Q) 65.7 84.6  Hematocrit 36.0 - 46.0 % 33.1(L) 40.0 38.0  Platelets 150 - 400 K/uL 354 - -     No results found.  Review of Systems  Constitutional: Negative for chills, fever and malaise/fatigue.       Reports to be feeling hot  HENT: Negative.   Eyes: Negative.   Respiratory: Negative.   Cardiovascular: Negative.   Gastrointestinal: Negative.   Genitourinary: Negative.   Musculoskeletal:       Right foot gangrene  Skin: Negative.    Blood pressure 123/66, pulse 88, temperature 97.7 F (36.5 C), resp. rate 12, height 5\' 2"  (1.575 m), weight 81.6 kg (180 lb), SpO2 100 %. Physical Exam  Nursing note and vitals reviewed. Constitutional: She appears well-developed and well-nourished. No distress.  Appears somewhat lethargic/somnolent status post anesthesia  HENT:  Head: Normocephalic and atraumatic.  Mouth/Throat: Oropharynx is clear and moist. No oropharyngeal exudate.  Eyes: Pupils are equal, round, and reactive to light. EOM are normal. No scleral icterus.  Neck: Normal range of motion. Neck supple. No JVD present.  Cardiovascular: Normal rate, regular rhythm and normal heart sounds.  No murmur heard. Respiratory: Effort normal and breath sounds normal. She has no wheezes. She has no rales.  Right IJ TDC  GI: Soft. Bowel sounds are normal. There is no tenderness. There is no rebound and no guarding.  Musculoskeletal:  Status post right below-knee amputation, wound VAC in situ  Neurological: She is alert.  Skin: Skin is warm and dry.    Assessment/Plan: 1.  Status post right below-knee amputation: Admitted for management of right foot gangrene  following failure to respond to salvage measures and today underwent definitive management with transtibial amputation.  Wound VAC in situ and plans noted for discharge to skilled nursing facility. 2.  End-stage renal disease: Continue hemodialysis on a Tuesday/Thursday/Saturday schedule.  Clinically without any acute needs for dialysis today. 3.  Anemia of chronic kidney disease: Compounded by recent right foot gangrene/inflammatory complex and ESA resistance.  Minimal intraoperative blood loss documented-we will monitor and trend postoperative hemoglobin/hematocrit for continued ESA versus PRBC needs. 4.  Secondary hyperparathyroidism: Resume phosphorus binders with meals 3 times a day and resume Hectorol with hemodialysis for PTH suppression.  Also on Sensipar. 5.  Hypertension: Pressures currently well controlled, continue to monitor with pain management/dialysis. 6.  History of CVA  Karlis Cregg K. 11/11/2017, 12:08 PM

## 2017-11-12 LAB — RENAL FUNCTION PANEL
ANION GAP: 10 (ref 5–15)
Albumin: 2.4 g/dL — ABNORMAL LOW (ref 3.5–5.0)
BUN: 33 mg/dL — AB (ref 8–23)
CHLORIDE: 102 mmol/L (ref 98–111)
CO2: 26 mmol/L (ref 22–32)
Calcium: 8.7 mg/dL — ABNORMAL LOW (ref 8.9–10.3)
Creatinine, Ser: 6.57 mg/dL — ABNORMAL HIGH (ref 0.44–1.00)
GFR calc Af Amer: 6 mL/min — ABNORMAL LOW (ref >60)
GFR calc non Af Amer: 5 mL/min — ABNORMAL LOW (ref >60)
GLUCOSE: 126 mg/dL — AB (ref 70–99)
POTASSIUM: 3.8 mmol/L (ref 3.5–5.1)
Phosphorus: 5.9 mg/dL — ABNORMAL HIGH (ref 2.5–4.6)
Sodium: 138 mmol/L (ref 135–145)

## 2017-11-12 LAB — CBC
HEMATOCRIT: 27.1 % — AB (ref 36.0–46.0)
HEMOGLOBIN: 7.7 g/dL — AB (ref 12.0–15.0)
MCH: 21.7 pg — AB (ref 26.0–34.0)
MCHC: 28.4 g/dL — AB (ref 30.0–36.0)
MCV: 76.3 fL — AB (ref 78.0–100.0)
Platelets: 376 10*3/uL (ref 150–400)
RBC: 3.55 MIL/uL — ABNORMAL LOW (ref 3.87–5.11)
RDW: 19.6 % — AB (ref 11.5–15.5)
WBC: 12.4 10*3/uL — ABNORMAL HIGH (ref 4.0–10.5)

## 2017-11-12 MED ORDER — RENA-VITE PO TABS
1.0000 | ORAL_TABLET | Freq: Every day | ORAL | Status: DC
  Administered 2017-11-12 – 2017-11-14 (×3): 1 via ORAL
  Filled 2017-11-12 (×3): qty 1

## 2017-11-12 MED ORDER — SEVELAMER CARBONATE 800 MG PO TABS
2400.0000 mg | ORAL_TABLET | Freq: Three times a day (TID) | ORAL | Status: DC
  Administered 2017-11-12 – 2017-11-15 (×9): 2400 mg via ORAL
  Filled 2017-11-12 (×9): qty 3

## 2017-11-12 MED ORDER — SODIUM CHLORIDE 0.9 % IV SOLN
125.0000 mg | INTRAVENOUS | Status: DC
  Administered 2017-11-12 – 2017-11-15 (×2): 125 mg via INTRAVENOUS
  Filled 2017-11-12 (×3): qty 10

## 2017-11-12 MED ORDER — DOXERCALCIFEROL 4 MCG/2ML IV SOLN
INTRAVENOUS | Status: AC
  Administered 2017-11-12: 11:00:00
  Filled 2017-11-12: qty 2

## 2017-11-12 MED ORDER — CINACALCET HCL 30 MG PO TABS
90.0000 mg | ORAL_TABLET | Freq: Every day | ORAL | Status: DC
  Administered 2017-11-12 – 2017-11-14 (×3): 90 mg via ORAL
  Filled 2017-11-12 (×3): qty 3

## 2017-11-12 MED ORDER — NEPRO/CARBSTEADY PO LIQD
237.0000 mL | Freq: Two times a day (BID) | ORAL | Status: DC
  Administered 2017-11-13 – 2017-11-15 (×5): 237 mL via ORAL
  Filled 2017-11-12 (×9): qty 237

## 2017-11-12 MED ORDER — DOXERCALCIFEROL 4 MCG/2ML IV SOLN
2.0000 ug | INTRAVENOUS | Status: DC
  Administered 2017-11-12 – 2017-11-15 (×3): 2 ug via INTRAVENOUS

## 2017-11-12 NOTE — Consult Note (Signed)
[facility] Progress Note   Dialysis Orders: GOC, TTS, 4 hours, 180 dialyzer, BFR 400, DFR 800, EDW 80 kg, 2K/2.25 calcium, linear sodium modeling, UF profile #2, right IJ TDC. Hectorol 2 just decreased 7/4 due to drop in iPTH Last Mircera 150 6/27 - hgb 9.4 21% sat ferritin 1600  - not on Fe  Assessment/Plan: 1. S/p right BKA for nonhealing wound - [facility]  2. ESRD - TTS - heparin on hold today labs pending 3. Anemia - hgb 9.4> 7.7  pre op - labs today pending - due for redose 7/11 - will start Fe 125 x 4 - prior ferritin likely due to inflammation related to wound  4. Secondary hyperparathyroidism -  VDRA- recently lowered to 2 due to drip in iPTH, sensipar 90 and renvela 3 ac 5. HTN/volume - BP slighly elevated; titrate to new edw - not on any BP meds per outside records- had been getting below edw at outpt unit ; IDWG generally  1- 2 L 6. Nutrition -  Resume vits/nepro   Sheffield SliderMartha B Boone Gear, PA-C [facility] Beeper [phone] 11/12/2017,8:49 AM  LOS: 1 day   Subjective:   Pain is not bad.  Objective Vitals:   11/12/17 0740 11/12/17 0750 11/12/17 0800 11/12/17 0830  BP: (!) 200/81 (!) 174/82 (!) 173/83 (!) 170/80  Pulse: 87 79 77 82  Resp: 17 15 16 17   Temp: 98.7 F (37.1 C) 98.7 F (37.1 C)    TempSrc: Oral Oral    SpO2: 100% 100% 100% 100%  Weight: 78.7 kg (173 lb 8 oz)     Height:       Physical Exam on HD goal 2 L  General: NAD Heart: RRR Lungs: grossly clear Abdomen: soft NT  Extremities: right BKA and left LE no edema/SCDs Dialysis Access:  Catheter at Qb 400   Additional Objective Labs: Basic Metabolic Panel: Recent Labs  Lab 11/11/17 0648  NA 139  K 3.6  CL 102  CO2 27  GLUCOSE 116*  BUN 21  CREATININE 4.56*  CALCIUM 9.2   Liver Function Tests: No results for input(s): AST, ALT, ALKPHOS, BILITOT, PROT, ALBUMIN in the last 168 hours. No results for input(s): LIPASE, AMYLASE in the last 168  hours. CBC: Recent Labs  Lab 11/11/17 0648 11/12/17 0837  WBC 11.3* 12.4*  HGB 9.4* 7.7*  HCT 33.1* 27.1*  MCV 76.6* 76.3*  PLT 354 376   Blood Culture    Component Value Date/Time   SDES  10/11/2017 1525    FOOT RIGHT Performed at [facility], [address] [place], [place] [address]    SPECREQUEST  10/11/2017 1525    NONE Performed at [facility], [address] [place], [place] [address]    CULT  10/11/2017 1525    MODERATE ESCHERICHIA COLI FEW METHICILLIN RESISTANT STAPHYLOCOCCUS AUREUS    REPTSTATUS 10/14/2017 FINAL 10/11/2017 1525    Cardiac Enzymes: No results for input(s): CKTOTAL, CKMB, CKMBINDEX, TROPONINI in the last 168 hours. CBG: Recent Labs  Lab 11/11/17 0647 11/11/17 0829 11/11/17 1153  GLUCAP 115* 99 96   Iron Studies: No results for input(s): IRON, TIBC, TRANSFERRIN, FERRITIN in the last 72 hours. Lab Results  Component Value Date   INR 1.02 07/08/2015   INR 0.95 12/14/2013   INR 1.21 10/31/2012   Studies/Results: No results found. Medications: . sodium chloride Stopped (11/12/17 0730)  . sodium chloride    . sodium chloride    .  methocarbamol (ROBAXIN)  IV     . aspirin  325 mg Oral Daily  . Chlorhexidine Gluconate Cloth  6 each Topical Q0600  . docusate sodium  100 mg Oral BID  . doxercalciferol  4 mcg Intravenous Q T,Th,Sa-HD  . feeding supplement (NEPRO CARB STEADY)  237 mL Oral BID BM  . multivitamin  1 tablet Oral QHS

## 2017-11-12 NOTE — Procedures (Signed)
Patient seen on Hemodialysis. QB 400, UF goal 1.5L Treatment adjusted as needed.  Zetta BillsJay Nomie Buchberger MD [facility]. Office # [phone] Pager # [phone] 9:33 AM

## 2017-11-12 NOTE — Progress Notes (Signed)
PT Cancellation Note  Patient Details Name: Clint LippsBetty G Clingerman MRN: [id number] DOB: 05/05/1933   Cancelled Treatment:    Reason Eval/Treat Not Completed: Patient at procedure or test/unavailable. Pt off of the floor for HD. PT will continue to follow acutely.    Alessandra BevelsJennifer M Javohn Basey 11/12/2017, 8:46 AM

## 2017-11-13 NOTE — Progress Notes (Signed)
Inpatient Rehabilitation  Per PT request, patient was screened by Letricia Krinsky for appropriateness for an Inpatient Acute Rehab consult.  At this time we are recommending an Inpatient Rehab consult.  Please order if you are agreeable.    Carlise Stofer, M.A., CCC/SLP Admission Coordinator  [facility]  Cell [phone]  

## 2017-11-13 NOTE — Progress Notes (Signed)
Patient ID: [name], female   DOB: 03/03/1933, 84 y.o.   MRN: [id number] Dialysis yesterday.  Post-op day #2 post right BKA.  Stump splint is in place.  Patient is awake and alert and motivated to get mobilizing.

## 2017-11-13 NOTE — Evaluation (Signed)
Physical Therapy Evaluation Patient Details Name: [name] MRN: [id number] DOB: 12/08/1932 Today's Date: 11/13/2017   History of Present Illness  Pt is an 84 year old female s/p R BKA. PMH consists of DM, diabetic neuropathy, chronic kidney disease (HD TTS), CVA, despression, and OA.     Clinical Impression  Pt admitted with above diagnosis. Pt currently with functional limitations due to the deficits listed below (see PT Problem List). PTA pt lived alone, independent with all functional mobilty. On eval, pt required min assist supine to sit, +2 mod/max assist sit to stand and use of stedy for bed to recliner transfer. Stump protector removed for mobility and for exercises RLE and then replaced in recliner. Pt is highly motivated to participate in therapy. Pt will benefit from skilled PT to increase their independence and safety with mobility to allow discharge to the venue listed below.       Follow Up Recommendations CIR    Equipment Recommendations  None recommended by PT;Other (comment)(defer to next venue)    Recommendations for Other Services Rehab consult     Precautions / Restrictions Precautions Precautions: Fall Restrictions RLE Weight Bearing: Non weight bearing Other Position/Activity Restrictions: stump protector in place      Mobility  Bed Mobility Overal bed mobility: Needs Assistance Bed Mobility: Supine to Sit     Supine to sit: Min assist;HOB elevated     General bed mobility comments: +rail, cues for sequencing, increased time and effort  Transfers Overall transfer level: Needs assistance Equipment used: Rolling walker (2 wheeled) Transfers: Sit to/from Stand Sit to Stand: +2 physical assistance;Mod assist;Max assist         General transfer comment: sit to stand with RW. Pt maintaining slightly flexed posture. Attempted stand-pivot transfer without success as pt unable to pivot on L foot. Stedy utilized for bed to recliner transfer. Pt  performed 4 trials of sit to stand during session.   Ambulation/Gait             General Gait Details: unable  Stairs            Wheelchair Mobility    Modified Rankin (Stroke Patients Only)       Balance Overall balance assessment: Needs assistance Sitting-balance support: Feet unsupported;No upper extremity supported Sitting balance-Leahy Scale: Fair     Standing balance support: Bilateral upper extremity supported;During functional activity Standing balance-Leahy Scale: Zero Standing balance comment: reliant on BUE support and therapist                             Pertinent Vitals/Pain Pain Assessment: Faces Faces Pain Scale: Hurts even more Pain Location: RLE Pain Descriptors / Indicators: Grimacing;Guarding Pain Intervention(s): Monitored during session;Repositioned    Home Living Family/patient expects to be discharged to:: Private residence Living Arrangements: Alone Available Help at Discharge: Family;Available 24 hours/day Type of Home: House Home Access: Stairs to enter Entrance Stairs-Rails: Doctor, general practiceight;Left Entrance Stairs-Number of Steps: 4 Home Layout: Able to live on main level with bedroom/bathroom Home Equipment: Walker - 2 wheels;Wheelchair - manual;Cane - single point;Bedside commode      Prior Function Level of Independence: Independent         Comments: Pt takes sponge baths.     Hand Dominance   Dominant Hand: Right    Extremity/Trunk Assessment   Upper Extremity Assessment Upper Extremity Assessment: Overall WFL for tasks assessed    Lower Extremity Assessment Lower Extremity Assessment: RLE deficits/detail RLE Deficits /  Details: 0-90 AROM R knee    Cervical / Trunk Assessment Cervical / Trunk Assessment: Kyphotic  Communication   Communication: No difficulties  Cognition Arousal/Alertness: Awake/alert Behavior During Therapy: WFL for tasks assessed/performed Overall Cognitive Status: Within Functional  Limits for tasks assessed                                        General Comments      Exercises Amputee Exercises Knee Flexion: AROM;Right;10 reps Knee Extension: AROM;Right;10 reps   Assessment/Plan    PT Assessment Patient needs continued PT services  PT Problem List Decreased strength;Decreased mobility;Decreased knowledge of precautions;Decreased activity tolerance;Pain;Decreased skin integrity;Decreased balance;Decreased knowledge of use of DME       PT Treatment Interventions DME instruction;Therapeutic activities;Therapeutic exercise;Patient/family education;Balance training;Functional mobility training;Wheelchair mobility training    PT Goals (Current goals can be found in the Care Plan section)  Acute Rehab PT Goals Patient Stated Goal: independence PT Goal Formulation: With patient/family Time For Goal Achievement: 11/27/17 Potential to Achieve Goals: Good    Frequency Min 3X/week   Barriers to discharge        Co-evaluation               AM-PAC PT "6 Clicks" Daily Activity  Outcome Measure Difficulty turning over in bed (including adjusting bedclothes, sheets and blankets)?: A Lot Difficulty moving from lying on back to sitting on the side of the bed? : A Lot Difficulty sitting down on and standing up from a chair with arms (e.g., wheelchair, bedside commode, etc,.)?: Unable Help needed moving to and from a bed to chair (including a wheelchair)?: A Lot Help needed walking in hospital room?: Total Help needed climbing 3-5 steps with a railing? : Total 6 Click Score: 9    End of Session Equipment Utilized During Treatment: Gait belt Activity Tolerance: Patient tolerated treatment well Patient left: in chair;with call bell/phone within reach;with family/visitor present Nurse Communication: Mobility status PT Visit Diagnosis: Other abnormalities of gait and mobility (R26.89);Pain;Difficulty in walking, not elsewhere classified  (R26.2) Pain - Right/Left: Right Pain - part of body: Leg    Time: 1610-9604 PT Time Calculation (min) (ACUTE ONLY): 30 min   Charges:   PT Evaluation $PT Eval Moderate Complexity: 1 Mod PT Treatments $Therapeutic Activity: 8-22 mins   PT G Codes:        Aida Raider, PT  Office # [phone] Pager [phone]   Ilda Foil 11/13/2017, 1:07 PM

## 2017-11-13 NOTE — Progress Notes (Signed)
[name] Progress Note   Dialysis Orders: GOC, TTS, 4 hours, 180 dialyzer, BFR 400, DFR 800, EDW 80 kg, 2K/2.25 calcium, linear sodium modeling, UF profile #2, right IJ TDC. [name] 2 just decreased 7/4 due to drop in iPTH Last Mircera 150 6/27 - hgb 9.4 21% sat ferritin 1600  - not on Fe  Assessment/Plan: 1. S/p right BKA for nonhealing wound - per Dr. Lajoyce [facility] - progressing well!! 2. ESRD - TTS - next HD Tuesday  3. Anemia - hgb 9.4> 7.7  due for redose 7/11 - will start Fe 125 x 4 - prior ferritin likely due to inflammation related to wound  4. Secondary hyperparathyroidism -  VDRA- recently lowered to 2 due to drip in iPTH, sensipar 90 and renvela 3 ac 5. HTN/volume - BP slighly elevated; titrate to new edw - not on any BP meds per outside records- had been getting below edw at outpt unit ; IDWG generally  1- 2 L.  Net UF Saturday 1.5 - no weights done! 6. Nutrition -  Resume vits/nepro - eating well alb 2.4 7. Hx CVA 8. Disp - likely SNF  Sheffield SliderMartha B Zephyr Sausedo, PA-C [facility] Beeper [phone] 11/13/2017,8:35 AM  LOS: 2 days   Subjective:   No problems with HD Saturday. Wants to know about getting stump splint off.  Eager to get out of bed and doing PT. Ate all of breakfast.  Son visiting.  Objective Vitals:   11/12/17 1135 11/12/17 1154 11/12/17 2032 11/13/17 0509  BP: (!) 173/67 (!) 150/63 (!) 141/66 (!) 166/74  Pulse: 81 85 79 86  Resp: 18 18 18 17   Temp: 98.8 F (37.1 C) 98.6 F (37 C) 98.2 F (36.8 C) 98.5 F (36.9 C)  TempSrc: Oral Oral Oral Oral  SpO2: 100% 100% 94% 100%  Weight:      Height:       Physical Exam General: NAD eating breafkast, spirits good Heart: RRR with occ ectopy Lungs: no rales Abdomen: obese soft NT Extremities: right BKA stump splints in place - no LLE edema Dialysis Access[facility]   Additional Objective Labs: Basic Metabolic Panel: Recent Labs  Lab 11/11/17 0648 11/12/17 0836  NA 139 138  K  3.6 3.8  CL 102 102  CO2 27 26  GLUCOSE 116* 126*  BUN 21 33*  CREATININE 4.56* 6.57*  CALCIUM 9.2 8.7*  PHOS  --  5.9*   Liver Function Tests: Recent Labs  Lab 11/12/17 0836  ALBUMIN 2.4*   No results for input(s): LIPASE, AMYLASE in the last 168 hours. CBC: Recent Labs  Lab 11/11/17 0648 11/12/17 0837  WBC 11.3* 12.4*  HGB 9.4* 7.7*  HCT 33.1* 27.1*  MCV 76.6* 76.3*  PLT 354 376   Blood Culture    Component Value Date/Time   SDES  10/11/2017 1525    FOOT RIGHT Performed at [facility], [address] [place], [place] [address]    SPECREQUEST  10/11/2017 1525    NONE Performed at [facility], [address] [place], [place] [address]    CULT  10/11/2017 1525    MODERATE ESCHERICHIA COLI FEW METHICILLIN RESISTANT STAPHYLOCOCCUS AUREUS    REPTSTATUS 10/14/2017 FINAL 10/11/2017 1525    Cardiac Enzymes: No results for input(s): CKTOTAL, CKMB, CKMBINDEX, TROPONINI in the last 168 hours. CBG: Recent Labs  Lab 11/11/17 0647 11/11/17 0829 11/11/17 1153  GLUCAP 115* 99 96   Iron Studies: No results for input(s): IRON,  TIBC, TRANSFERRIN, FERRITIN in the last 72 hours. Lab Results  Component Value Date   INR 1.02 07/08/2015   INR 0.95 12/14/2013   INR 1.21 10/31/2012   Studies/Results: No results found. Medications: . sodium chloride 10 mL/hr at 11/12/17 1208  . sodium chloride    . sodium chloride    . ferric gluconate (FERRLECIT/NULECIT) IV 125 mg (11/12/17 1210)  . methocarbamol (ROBAXIN)  IV     . aspirin  325 mg Oral Daily  . Chlorhexidine Gluconate Cloth  6 each Topical Q0600  . cinacalcet  90 mg Oral Q supper  . docusate sodium  100 mg Oral BID  . doxercalciferol  2 mcg Intravenous Q T,Th,Sa-HD  . feeding supplement (NEPRO CARB STEADY)  237 mL Oral BID BM  . multivitamin  1 tablet Oral QHS  . sevelamer carbonate  2,400 mg Oral TID WC

## 2017-11-14 ENCOUNTER — Encounter (HOSPITAL_COMMUNITY): Payer: Self-pay | Admitting: Orthopedic Surgery

## 2017-11-14 DIAGNOSIS — Z89511 Acquired absence of right leg below knee: Secondary | ICD-10-CM

## 2017-11-14 DIAGNOSIS — G8918 Other acute postprocedural pain: Secondary | ICD-10-CM

## 2017-11-14 DIAGNOSIS — N186 End stage renal disease: Secondary | ICD-10-CM

## 2017-11-14 DIAGNOSIS — D638 Anemia in other chronic diseases classified elsewhere: Secondary | ICD-10-CM

## 2017-11-14 DIAGNOSIS — S88111A Complete traumatic amputation at level between knee and ankle, right lower leg, initial encounter: Secondary | ICD-10-CM

## 2017-11-14 DIAGNOSIS — D62 Acute posthemorrhagic anemia: Secondary | ICD-10-CM

## 2017-11-14 DIAGNOSIS — E669 Obesity, unspecified: Secondary | ICD-10-CM

## 2017-11-14 DIAGNOSIS — Z8673 Personal history of transient ischemic attack (TIA), and cerebral infarction without residual deficits: Secondary | ICD-10-CM

## 2017-11-14 DIAGNOSIS — E1169 Type 2 diabetes mellitus with other specified complication: Secondary | ICD-10-CM

## 2017-11-14 DIAGNOSIS — I10 Essential (primary) hypertension: Secondary | ICD-10-CM

## 2017-11-14 DIAGNOSIS — Z992 Dependence on renal dialysis: Secondary | ICD-10-CM

## 2017-11-14 MED ORDER — DARBEPOETIN ALFA 150 MCG/0.3ML IJ SOSY
150.0000 ug | PREFILLED_SYRINGE | INTRAMUSCULAR | Status: DC
  Administered 2017-11-15 (×2): 150 ug via INTRAVENOUS
  Filled 2017-11-14: qty 0.3

## 2017-11-14 NOTE — Consult Note (Signed)
Physical Medicine and Rehabilitation Consult Reason for Consult: Decreased functional mobility Referring Physician: Dr. Lajoyce Corners   HPI: [name] is a 84 y.o. right-handed female with history of diabetes mellitus, CVA, end-stage renal disease with hemodialysis.  Per chart review and patient, patient lives alone.  Independent prior to admission.  She does have nieces and nephews that check on her routinely but cannot be with her at all times.  Presented 11/11/2017 with gangrenous changes of the right second toe.  Patient has undergone vascular work-up in the past and not felt to be a revascularization candidate.  Noted progressive ischemic changes and underwent right transtibial amputation 11/11/2017 per Dr. Lajoyce Corners.  Wound VAC was placed postoperatively.  Hospital course pain management.  Acute on chronic anemia 7.7.  Physical therapy evaluation completed with recommendations of physical medicine rehab consult.   Review of Systems  Constitutional: Negative for chills and fever.  HENT: Negative for hearing loss.   Eyes: Negative for blurred vision and double vision.  Respiratory: Negative for cough.        Occasional shortness of breath with exertion  Cardiovascular: Positive for leg swelling. Negative for chest pain and palpitations.  Gastrointestinal: Positive for constipation. Negative for nausea and vomiting.       GERD  Genitourinary: Negative for flank pain and hematuria.  Musculoskeletal: Positive for joint pain and myalgias.  Skin: Negative for rash.  Psychiatric/Behavioral: Positive for depression.  All other systems reviewed and are negative.  Past Medical History:  Diagnosis Date  . Anemia   . C. difficile diarrhea   . Chronic kidney disease    on Dialysis  . Depression   . Diabetes mellitus   . Diabetic neuropathy (HCC)   . Gangrene (HCC)    right foot  . GERD (gastroesophageal reflux disease)   . Gout   . Hyperparathyroidism   . MRSA (methicillin resistant staph  aureus) culture positive   . Obesity   . Osteoarthritis   . Peripheral arterial disease (HCC)    Gangrene-Left Great Toe  . Shortness of breath dyspnea    with exertion  . Stroke [facility])    Past Surgical History:  Procedure Laterality Date  . ABDOMINAL AORTOGRAM N/A 07/05/2016   Procedure: Abdominal Aortogram;  Surgeon: Maeola Harman, MD;  Location: [facility] INVASIVE CV LAB;  Service: Cardiovascular;  Laterality: N/A;  . ABDOMINAL AORTOGRAM N/A 06/24/2017   Procedure: ABDOMINAL AORTOGRAM;  Surgeon: Sherren Kerns, MD;  Location: [facility] INVASIVE CV LAB;  Service: Cardiovascular;  Laterality: N/A;  . ABDOMINAL HYSTERECTOMY    . AV FISTULA PLACEMENT Left 07/07/2015   Procedure: ATTEMPTED INSERTION OFLEFT  ARTERIOVENOUS (AV) GORE-TEX GRAFT THIGH;  Surgeon: Fransisco Hertz, MD;  Location: [facility] OR;  Service: Vascular;  Laterality: Left;  . BELOW KNEE LEG AMPUTATION Right 11/11/2017  . BREAST EXCISIONAL BIOPSY Right 05/25/2006  . COLONOSCOPY    . LOWER EXTREMITY ANGIOGRAPHY Bilateral 07/05/2016   Procedure: Lower Extremity Angiography;  Surgeon: Maeola Harman, MD;  Location: [facility] INVASIVE CV LAB;  Service: Cardiovascular;  Laterality: Bilateral;  . LOWER EXTREMITY ANGIOGRAPHY Bilateral 06/24/2017   Procedure: Lower Extremity Angiography;  Surgeon: Sherren Kerns, MD;  Location: [facility] INVASIVE CV LAB;  Service: Cardiovascular;  Laterality: Bilateral;  . MULTIPLE TOOTH EXTRACTIONS    . PERIPHERAL VASCULAR INTERVENTION Right 06/24/2017   Procedure: PERIPHERAL VASCULAR INTERVENTION;  Surgeon: Sherren Kerns, MD;  Location: [facility] INVASIVE CV LAB;  Service: Cardiovascular;  Laterality: Right;  POPLITEALPTA SFA  STENT  . SHUNTOGRAM Right 09/06/12   Thigh graft  . SP DECLOT AVGG Right Sep 14, 2012   Right thigh graft   Family History  Problem Relation Age of Onset  . Cancer Father        type unknown  . Stroke Mother   . Diabetes Sister   . Cancer Brother        type unknown   Social  History:  reports that she has quit smoking. Her smoking use included cigarettes. She has never used smokeless tobacco. She reports that she does not drink alcohol or use drugs. Allergies:  Allergies  Allergen Reactions  . Codeine Hives  . Vancomycin Rash   Facility-Administered Medications Prior to Admission  Medication Dose Route Frequency Provider Last Rate Last Dose  . protamine injection 25 mg  25 mg Intravenous Once Sherren Kerns, MD       Medications Prior to Admission  Medication Sig Dispense Refill  . acetaminophen (TYLENOL) 500 MG tablet Take 1,000 mg by mouth every 6 (six) hours as needed for mild pain or headache.     . Doxercalciferol (HECTOROL) 2 MCG/ML SOLN Inject 3 mcg into the vein 3 (three) times a week.    . gabapentin (NEURONTIN) 100 MG capsule Take 100 mg by mouth at bedtime.     Marland Kitchen HYDROcodone-acetaminophen (NORCO) 5-325 MG tablet Take 1 tablet by mouth every 6 (six) hours as needed for moderate pain. 20 tablet 0  . IRON SUCROSE IV Inject 1 Dose into the vein once a week.    . meperidine (DEMEROL) 50 MG tablet Take 1 tablet (50 mg total) by mouth every 6 (six) hours as needed. 10 tablet 0  . Methoxy PEG-Epoetin Beta (MIRCERA) 75 MCG/0.3ML SOSY Inject 75 mcg as directed every 14 (fourteen) days.    Marland Kitchen RENVELA 800 MG tablet Take 1,600-2,400 mg by mouth See admin instructions. Take 2400 mg by mouth 3 times daily with meals and take 1600 mg by mouth with snacks    . SENSIPAR 30 MG tablet Take 30 mg by mouth every evening.     . clindamycin (CLEOCIN) 300 MG capsule Take 1 capsule (300 mg total) by mouth 3 (three) times daily. (Patient not taking: Reported on 11/09/2017) 30 capsule 0  . clopidogrel (PLAVIX) 75 MG tablet Take 1 tablet (75 mg total) by mouth daily. (Patient not taking: Reported on 11/09/2017) 30 tablet 11  . collagenase (SANTYL) ointment Apply 1 application topically daily. (Patient not taking: Reported on 11/09/2017) 15 g 5  . HYDROcodone-acetaminophen  (NORCO/VICODIN) 5-325 MG tablet Take 1 tablet by mouth every 4 (four) hours as needed. (Patient not taking: Reported on 11/09/2017) 20 tablet 0  . metroNIDAZOLE (FLAGYL) 500 MG tablet Take 1 tablet (500 mg total) by mouth 2 (two) times daily. (Patient not taking: Reported on 11/09/2017) 60 tablet 1  . mupirocin ointment (BACTROBAN) 2 % Apply 1 application topically 2 (two) times daily. (Patient not taking: Reported on 11/09/2017) 30 g 2  . nitroGLYCERIN (NITRO-DUR) 0.1 mg/hr patch Remove prior patch and apply 0.1 mg patch to R foot daily. (Patient not taking: Reported on 11/09/2017) 30 patch 1  . oxyCODONE-acetaminophen (PERCOCET/ROXICET) 5-325 MG tablet Take 1 tablet by mouth every 6 (six) hours as needed. 10 tablet 0  . silver sulfADIAZINE (SILVADENE) 1 % cream Apply 1 application topically daily. (Patient not taking: Reported on 11/09/2017) 50 g 0    Home: Home Living Family/patient expects to be discharged to:: Private residence Living  Arrangements: Alone Available Help at Discharge: Family, Available 24 hours/day Type of Home: House Home Access: Stairs to enter [employer] of Steps: 4 Entrance Stairs-Rails: Right, Left Home Layout: Able to live on main level with bedroom/bathroom Bathroom Shower/Tub: Engineer, manufacturing systems: Standard Home Equipment: Environmental consultant - 2 wheels, Wheelchair - manual, Cane - single point, Bedside commode  Functional History: Prior Function Level of Independence: Independent Comments: Pt takes sponge baths. Functional Status:  Mobility: Bed Mobility Overal bed mobility: Needs Assistance Bed Mobility: Supine to Sit Supine to sit: Min assist, HOB elevated General bed mobility comments: +rail, cues for sequencing, increased time and effort Transfers Overall transfer level: Needs assistance Equipment used: Rolling walker (2 wheeled) Transfer via Lift Equipment: Stedy Transfers: Sit to/from Stand Sit to Stand: +2 physical assistance, Mod assist, Max  assist General transfer comment: sit to stand with RW. Pt maintaining slightly flexed posture. Attempted stand-pivot transfer without success as pt unable to pivot on L foot. Stedy utilized for bed to recliner transfer. Pt performed 4 trials of sit to stand during session.  Ambulation/Gait General Gait Details: unable    ADL:    Cognition: Cognition Overall Cognitive Status: Within Functional Limits for tasks assessed Orientation Level: Oriented X4 Cognition Arousal/Alertness: Awake/alert Behavior During Therapy: WFL for tasks assessed/performed Overall Cognitive Status: Within Functional Limits for tasks assessed  Blood pressure (!) 151/64, pulse 79, temperature 98.2 F (36.8 C), temperature source Oral, resp. rate 16, height 5\' 2"  (1.575 m), weight 78.7 kg (173 lb 8 oz), SpO2 100 %. Physical Exam  Vitals reviewed. Constitutional: She is oriented to person, place, and time. She appears well-developed.  Obese  HENT:  Head: Normocephalic and atraumatic.  Eyes: EOM are normal. Right eye exhibits no discharge. Left eye exhibits no discharge.  Neck: Normal range of motion. Neck supple. No thyromegaly present.  Cardiovascular: Normal rate, regular rhythm and normal heart sounds.  Respiratory: Effort normal and breath sounds normal. No respiratory distress.  GI: Soft. Bowel sounds are normal. She exhibits no distension.  Musculoskeletal:  Right BKA tender  Neurological: She is alert and oriented to person, place, and time.  Motor: B/l UE 5/5 proximal to distal LLE: 4-/5 proximal to distal RLE: 2-/5 proximal to distal (pain inhibition) Sensation intact to light touch  Skin:  BKA site is dressed with wound VAC in place  Psychiatric: She has a normal mood and affect. Her behavior is normal. Thought content normal.    No results found for this or any previous visit (from the past 24 hour(s)). No results found.  Assessment/Plan: Diagnosis: Right BKA Labs independently reviewed.   Records reviewed and summated above. Clean amputation daily with soap and water Monitor incision site for signs of infection or impending skin breakdown. Staples to remain in place for 3-4 weeks Stump shrinker, for edema control  Scar mobilization massaging to prevent soft tissue adherence Stump protector during therapies Prevent flexion contractures by implementing the following:   Encourage prone lying for 20-30 mins per day BID to avoid hip flexion  Contractures if medically appropriate;  Avoid pillow under knees when patient is lying in bed in order to prevent both  knee and hip flexion contractures;  Avoid prolonged sitting Post surgical pain control with oral medication Phantom limb pain control with physical modalities including desensitization techniques (gentle self massage to the residual stump,hot packs if sensation intact, [place]) and mirror therapy, TENS. If ineffective, consider pharmacological treatment for neuropathic pain (e.g gabapentin, pregabalin, amytriptalyine, duloxetine).  When using  wheelchair, patient should have knee on amputated side fully extended with board under the seat cushion. Avoid injury to contralateral side  1. Does the need for close, 24 hr/day medical supervision in concert with the patient's rehab needs make it unreasonable for this patient to be served in a less intensive setting? Yes  2. Co-Morbidities requiring supervision/potential complications: diabetes mellitus (Monitor in accordance with exercise and adjust meds as necessary), history of CVA, end-stage renal disease with hemodialysis (recs per Nephro), post-op pain management (Biofeedback training with therapies to help reduce reliance on opiate pain medications, monitor pain control during therapies, and sedation at rest and titrate to maximum efficacy to ensure participation and gains in therapies), acute on chronic anemia (transfuse if necessary to ensure appropriate perfusion for increased activity  tolerance), HTN (monitor and provide prns in accordance with increased physical exertion and pain), morbid obesity (encourage weight loss) 3. Due to safety, skin/wound care, disease management, pain management and patient education, does the patient require 24 hr/day rehab nursing? Yes 4. Does the patient require coordinated care of a physician, rehab nurse, PT (1-2 hrs/day, 5 days/week) and OT (1-2 hrs/day, 5 days/week) to address physical and functional deficits in the context of the above medical diagnosis(es)? Yes Addressing deficits in the following areas: balance, endurance, locomotion, strength, transferring, bowel/bladder control, bathing, dressing, toileting and psychosocial support 5. Can the patient actively participate in an intensive therapy program of at least 3 hrs of therapy per day at least 5 days per week? Yes 6. The potential for patient to make measurable gains while on inpatient rehab is excellent 7. Anticipated functional outcomes upon discharge from inpatient rehab are supervision and min assist  with PT, supervision and min assist with OT, n/a with SLP. 8. Estimated rehab length of stay to reach the above functional goals is: 12-16 days. 9. Anticipated D/C setting: Home 10. Anticipated post D/C treatments: HH therapy and Home excercise program 11. Overall Rehab/Functional Prognosis: good  RECOMMENDATIONS: This patient's condition is appropriate for continued rehabilitative care in the following setting: CIR if patient can arrange for caregiver support as she will need assistance at discharge. Patient has agreed to participate in recommended program. Yes Note that insurance prior authorization may be required for reimbursement for recommended care.  Comment: Rehab Admissions Coordinator to follow up.   I have personally performed a face to face diagnostic evaluation, including, but not limited to relevant history and physical exam findings, of this patient and developed  relevant assessment and plan.  Additionally, I have reviewed and concur with the physician assistant's documentation above.   Maryla MorrowAnkit Braedyn Riggle, MD, ABPMR Mcarthur Rossettianiel J Angiulli, PA-C 11/14/2017

## 2017-11-14 NOTE — Progress Notes (Signed)
Physical Therapy Treatment Patient Details Name: Jamie LippsBetty G [name] MRN: [id number] DOB: 09/18/1932 Today's Date: 11/14/2017    History of Present Illness Pt is an 84 year old female s/p R BKA. PMH consists of DM, diabetic neuropathy, chronic kidney disease (HD TTS), CVA, despression, and OA.     PT Comments    Pt performed LE strengthening and Anterior scoot transfer back to bed.  Pt remains motivated and pleased with her progression.  Pt continues to lack strength in all extremities and required moderate assistance to complete transfer back to bed.  At this time we still recommend CIR therapies for patient to improve strength and function before returning home.      Follow Up Recommendations  CIR     Equipment Recommendations  None recommended by PT;Other (comment)(defer to next venue)    Recommendations for Other Services Rehab consult     Precautions / Restrictions Precautions Precautions: Fall Required Braces or Orthoses: Other Brace/Splint Other Brace/Splint: stump protector R LE  Restrictions Weight Bearing Restrictions: Yes RLE Weight Bearing: Non weight bearing Other Position/Activity Restrictions: stump protector in place    Mobility  Bed Mobility Overal bed mobility: Needs Assistance Bed Mobility: Sit to Supine;Rolling Rolling: Min assist     Sit to supine: Mod assist   General bed mobility comments: Pt required use of railing and handle created with strap to return to supine from seated position.  Pt required cues for rolling to R and L for pericare and to replace soiled pad.    Transfers Overall transfer level: Needs assistance Equipment used: None Transfers: Licensed conveyancerAnterior-Posterior Transfer       Anterior-Posterior transfers: Mod assist;+2 safety/equipment   General transfer comment: Pt able to scoot forward to get to edge of chair and advance over to bed to return to supine.  Pt with posterior lean and heavy dependence on railing and hand hold created with  gait belt to scoot back into the bed from seated position in the chair.    Ambulation/Gait Ambulation/Gait assistance: (NT)               Stairs             Wheelchair Mobility    Modified Rankin (Stroke Patients Only)       Balance Overall balance assessment: Needs assistance Sitting-balance support: No upper extremity supported;Feet supported Sitting balance-Leahy Scale: Poor     Standing balance support: (NT)                                Cognition Arousal/Alertness: Awake/alert Behavior During Therapy: WFL for tasks assessed/performed;Anxious Overall Cognitive Status: Within Functional Limits for tasks assessed                                        Exercises General Exercises - Lower Extremity Quad Sets: AROM;Both;10 reps;Supine Long Arc Quad: AROM;Both;10 reps;Supine Heel Slides: AROM;Both;10 reps;Supine Hip ABduction/ADduction: AROM;Both;10 reps;Supine Straight Leg Raises: AROM;Both;10 reps;Supine Other Exercises Other Exercises: modified chair push-up more focus on weight shifting forward as patient is too weak to clear her bottom.  1x10 reps B UEs and LLE.      General Comments        Pertinent Vitals/Pain Pain Assessment: Faces Faces Pain Scale: Hurts even more Pain Location: RLE Pain Descriptors / Indicators: Grimacing;Guarding Pain Intervention(s): Monitored during session;Repositioned  Home Living Family/patient expects to be discharged to:: Private residence                    Prior Function            PT Goals (current goals can now be found in the care plan section) Acute Rehab PT Goals Patient Stated Goal: independence Potential to Achieve Goals: Good Progress towards PT goals: Progressing toward goals    Frequency    Min 3X/week      PT Plan Current plan remains appropriate    Co-evaluation              AM-PAC PT "6 Clicks" Daily Activity  Outcome Measure   Difficulty turning over in bed (including adjusting bedclothes, sheets and blankets)?: A Lot Difficulty moving from lying on back to sitting on the side of the bed? : A Lot Difficulty sitting down on and standing up from a chair with arms (e.g., wheelchair, bedside commode, etc,.)?: Unable Help needed moving to and from a bed to chair (including a wheelchair)?: A Lot Help needed walking in hospital room?: Total Help needed climbing 3-5 steps with a railing? : Total 6 Click Score: 9    End of Session   Activity Tolerance: Patient tolerated treatment well Patient left: in chair;with call bell/phone within reach;with family/visitor present Nurse Communication: Mobility status PT Visit Diagnosis: Other abnormalities of gait and mobility (R26.89);Pain;Difficulty in walking, not elsewhere classified (R26.2) Pain - Right/Left: Right Pain - part of body: Leg     Time: 0865-7846 PT Time Calculation (min) (ACUTE ONLY): 28 min  Charges:  $Therapeutic Exercise: 8-22 mins $Therapeutic Activity: 8-22 mins                    G Codes:       [name], PTA pager [phone]    [name] 11/14/2017, 3:09 PM

## 2017-11-14 NOTE — Progress Notes (Addendum)
Patient ID: [name], female   DOB: 04/30/1933, 84 y.o.   MRN: [id number] Postoperative day 3 right transtibial amputation.  The wound VAC has about 75 cc in the wound VAC canister noted.  Plan for discharge to inpatient rehabilitation versus skilled nursing facility.  Orders written for OT and inpatient evaluation.

## 2017-11-14 NOTE — [place] FL2 (Addendum)
[place] MEDICAID FL2 LEVEL OF CARE SCREENING TOOL     IDENTIFICATION  Patient Name: Jamie LippsBetty G Hiemstra Birthdate: 07/21/1932 Sex: female Admission Date (Current Location): 11/11/2017  [facility] [place]   Facility and Address:  [facility] [place], [address], [place], [place] [address]      Provider Number: [id number]  Attending Physician Name and Address:  Nadara Mustarduda, Marcus V, MD  Relative Name and Phone Number:       Current Level of Care: Hospital Recommended Level of Care: Skilled Nursing Facility Prior Approval Number:    Date Approved/Denied:   PASRR Number: [id number]  Discharge Plan: SNF    Current Diagnoses: Patient Active Problem List   Diagnosis Date Noted  . Below knee amputation status, right (HCC) 11/11/2017  . Gangrene of right foot (HCC)   . Osteomyelitis, chronic, ankle or foot, right (HCC) 07/11/2017  . PAD (peripheral artery disease) (HCC) 04/29/2015  . Atherosclerosis of native arteries of the extremities with ulceration (HCC) 01/10/2014  . CVA (cerebral infarction) 12/14/2013  . Dysphagia, unspecified(787.20) 11/28/2013  . Diabetes mellitus (HCC) 10/31/2012  . Depression 10/31/2012  . Anemia 10/31/2012  . History of Clostridium difficile colitis 10/31/2012  . Chronic diarrhea 10/31/2012  . ESRD on dialysis (HCC) 10/06/2012    Orientation RESPIRATION BLADDER Height & Weight     Self, Time, Situation, Place  Normal Continent Weight: 173 lb 8 oz (78.7 kg) Height:  5\' 2"  (157.5 cm)  BEHAVIORAL SYMPTOMS/MOOD NEUROLOGICAL BOWEL NUTRITION STATUS      Continent Diet(carb modified; renal)  AMBULATORY STATUS COMMUNICATION OF NEEDS Skin     Verbally Wound Vac, Surgical wounds(amputation site on right leg- prevena wound vac)                       Personal Care Assistance Level of Assistance              Functional Limitations Info             SPECIAL CARE FACTORS FREQUENCY  PT (By licensed PT), OT  (By licensed OT)     PT Frequency: 5/wk OT Frequency: 5/wk            Contractures      Additional Factors Info  Code Status, Allergies, Isolation Precautions Code Status Info: FULL Allergies Info: Codeine, Vancomycin     Isolation Precautions Info: MRSA     Current Medications (11/14/2017):  This is the current hospital active medication list Current Facility-Administered Medications  Medication Dose Route Frequency Provider Last Rate Last Dose  . 0.9 %  sodium chloride infusion   Intravenous Continuous Nadara Mustarduda, Marcus V, MD 10 mL/hr at 11/12/17 1208    . 0.9 %  sodium chloride infusion  100 mL Intravenous PRN Zetta BillsPatel, Jay, MD      . 0.9 %  sodium chloride infusion  100 mL Intravenous PRN Zetta BillsPatel, Jay, MD      . acetaminophen (TYLENOL) tablet 325-650 mg  325-650 mg Oral Q6H PRN Nadara Mustarduda, Marcus V, MD   650 mg at 11/12/17 1347  . alteplase (CATHFLO ACTIVASE) injection 2 mg  2 mg Intracatheter Once PRN Zetta BillsPatel, Jay, MD      . aspirin tablet 325 mg  325 mg Oral Daily Nadara Mustarduda, Marcus V, MD   325 mg at 11/13/17 0800  . bisacodyl (DULCOLAX) suppository 10 mg  10 mg Rectal Daily PRN Nadara Mustarduda, Marcus V, MD      . Chlorhexidine Gluconate  Cloth 2 % PADS 6 each  6 each Topical Q0600 Zetta Bills, MD   6 each at 11/13/17 0534  . cinacalcet (SENSIPAR) tablet 90 mg  90 mg Oral Q supper Weston Settle, PA-C   90 mg at 11/13/17 1625  . docusate sodium (COLACE) capsule 100 mg  100 mg Oral BID Nadara Mustard, MD   100 mg at 11/13/17 2123  . doxercalciferol (HECTOROL) injection 2 mcg  2 mcg Intravenous Q T,Th,Sa-HD Weston Settle, PA-C   2 mcg at 11/12/17 1125  . feeding supplement (NEPRO CARB STEADY) liquid 237 mL  237 mL Oral BID BM Weston Settle, PA-C   237 mL at 11/13/17 1348  . ferric gluconate (NULECIT) 125 mg in sodium chloride 0.9 % 100 mL IVPB  125 mg Intravenous Q T,Th,Sa-HD Weston Settle, PA-C 110 mL/hr at 11/12/17 1210 125 mg at 11/12/17 1210  . HYDROmorphone (DILAUDID) injection 0.5-1 mg  0.5-1 mg  Intravenous Q4H PRN Nadara Mustard, MD   1 mg at 11/13/17 2123  . lidocaine (PF) (XYLOCAINE) 1 % injection 5 mL  5 mL Intradermal PRN Zetta Bills, MD      . lidocaine-prilocaine (EMLA) cream 1 application  1 application Topical PRN Zetta Bills, MD      . magnesium citrate solution 1 Bottle  1 Bottle Oral Once PRN Nadara Mustard, MD      . methocarbamol (ROBAXIN) tablet 500 mg  500 mg Oral Q6H PRN Nadara Mustard, MD   500 mg at 11/12/17 1347   Or  . methocarbamol (ROBAXIN) 500 mg in dextrose 5 % 50 mL IVPB  500 mg Intravenous Q6H PRN Nadara Mustard, MD      . metoCLOPramide (REGLAN) tablet 5-10 mg  5-10 mg Oral Q8H PRN Nadara Mustard, MD       Or  . metoCLOPramide (REGLAN) injection 5-10 mg  5-10 mg Intravenous Q8H PRN Nadara Mustard, MD      . multivitamin (RENA-VIT) tablet 1 tablet  1 tablet Oral QHS Weston Settle, PA-C   1 tablet at 11/13/17 2123  . ondansetron (ZOFRAN) tablet 4 mg  4 mg Oral Q6H PRN Nadara Mustard, MD       Or  . ondansetron [facility]) injection 4 mg  4 mg Intravenous Q6H PRN Nadara Mustard, MD      . oxyCODONE (Oxy IR/ROXICODONE) immediate release tablet 10-15 mg  10-15 mg Oral Q4H PRN Nadara Mustard, MD   10 mg at 11/13/17 1211  . oxyCODONE (Oxy IR/ROXICODONE) immediate release tablet 5-10 mg  5-10 mg Oral Q4H PRN Nadara Mustard, MD   10 mg at 11/12/17 1739  . pentafluoroprop-tetrafluoroeth (GEBAUERS) aerosol 1 application  1 application Topical PRN Zetta Bills, MD      . polyethylene glycol (MIRALAX / GLYCOLAX) packet 17 g  17 g Oral Daily PRN Nadara Mustard, MD      . sevelamer carbonate (RENVELA) tablet 2,400 mg  2,400 mg Oral TID WC Weston Settle, PA-C   2,400 mg at 11/14/17 0740     Discharge Medications: Please see discharge summary for a list of discharge medications.  Relevant Imaging Results:  Relevant Lab Results:   Additional Information SS#: [id number]; dialysis TTS at [facility] off of [address] uses [facility]  Simranjit Thayer, [place] H, LCSW

## 2017-11-14 NOTE — Clinical Social Work Note (Signed)
Clinical Social Work Assessment  Patient Details  Name: [name] MRN: [id number] Date of Birth: [birth year]  Date of referral:  11/14/17               Reason for consult:  Facility Placement                Permission sought to share information with:  Facility Art therapist granted to share information::  Yes, Verbal Permission Granted  Name::        Agency::  SNF  Relationship::     Contact Information:     Housing/Transportation Living arrangements for the past 2 months:  [place] of Information:  Patient Patient Interpreter Needed:  None Criminal Activity/Legal Involvement Pertinent to Current Situation/Hospitalization:  No - Comment as needed Significant Relationships:  Other Family Members Lives with:  Self Do you feel safe going back to the place where you live?  No Need for family participation in patient care:  No (Coment)  Care giving concerns:  Pt lives at home alone and now with increased impairment following amputation.     Social Worker assessment / plan:  CSW met with pt to discuss PT recommendation for rehab at time of DC.  CSW explained CIR vs SNF and SNF as alternative option to CIR if she is not eligible for their program.  Employment status:  Retired Forensic scientist:  Medicare PT Recommendations:  Inpatient Rehab Consult Information / Referral to community resources:  [place]  Patient/Family's Response to care:  Pt agreeable to SNF work up and might be interested in going to [facility] where she has been in the past.  Patient/Family's Understanding of and Emotional Response to Diagnosis, Current Treatment, and Prognosis:  Pt expresses understanding with current plan- hopeful that she can regain independence with help from a rehab program.  Emotional Assessment Appearance:  Appears stated age Attitude/Demeanor/Rapport:    Affect (typically observed):  Appropriate, Pleasant Orientation:   Oriented to Self, Oriented to  Time, Oriented to Place, Oriented to Situation Alcohol / Substance use:  Not Applicable Psych involvement (Current and /or in the community):  No (Comment)  Discharge Needs  Concerns to be addressed:  Care Coordination Readmission within the last 30 days:  No Current discharge risk:  Physical Impairment Barriers to Discharge:  Continued Medical Work up   Jorge Ny, LCSW 11/14/2017, 11:56 AM

## 2017-11-14 NOTE — Anesthesia Postprocedure Evaluation (Signed)
Anesthesia Post Note  Patient: Jamie LippsBetty G [name]  Procedure(s) Performed: RIGHT BELOW KNEE AMPUTATION (Right )     Patient location during evaluation: PACU Anesthesia Type: General Level of consciousness: awake Pain management: pain level controlled Vital Signs Assessment: post-procedure vital signs reviewed and stable Respiratory status: spontaneous breathing, nonlabored ventilation, respiratory function stable and patient connected to nasal cannula oxygen Cardiovascular status: blood pressure returned to baseline and stable Postop Assessment: no apparent nausea or vomiting Anesthetic complications: no    Last Vitals:  Vitals:   11/14/17 0533 11/14/17 1458  BP: (!) 151/64 (!) 152/63  Pulse: 79 91  Resp: 16 18  Temp: 36.8 C 37.1 C  SpO2: 100% 99%    Last Pain:  Vitals:   11/14/17 1458  TempSrc: Oral  PainSc:                  Catheryn Baconyan P Ellender

## 2017-11-14 NOTE — Progress Notes (Signed)
Inpatient Rehabilitation-Admissions Coordinator   Met with patient at the bedside (and spoke with niece, Janifer Adie, over the phone) as follow up from PM&R consult. AC discussed recommendations for rehab venue. At this time, pt does not have the recommended caregiver support at home needed for safe DC from CIR. AC has contacted SW/CM regarding recommendation. CIR will sign off at this time. Please call for questions.   Jhonnie Garner, OTR/L  Rehab Admissions Coordinator  [phone] 11/14/2017 4:18 PM

## 2017-11-14 NOTE — Care Management Important Message (Signed)
Important Message  Patient Details  Name: [name] MRN: [id number] Date of Birth: 01/10/1933   Medicare Important Message Given:  Yes    Aryella Besecker 11/14/2017, 4:19 PM

## 2017-11-14 NOTE — Progress Notes (Addendum)
Occupational Therapy Evaluation Patient Details Name: [name] MRN: [id number] DOB: 1932/12/24 Today's Date: 11/14/2017    History of Present Illness Pt is an 84 year old female s/p R BKA. PMH consists of DM, diabetic neuropathy, chronic kidney disease (HD TTS), CVA, despression, and OA.    Clinical Impression   PTA patient reports completing ADLs independently (basin baths only) given increased time and completed limited IADLs (niece provided meals).  Admitted s/p R BKA, and currently requires setup assist for UB ADL, max to total assist for LB ADL (+2 assist if standing), requires use of stedy to complete simulated toilet transfer to recliner with mod-max A +2.  She is limited by generalized weakness, decreased activity tolerance, impaired balance, NWB status R LE, and pain.  Patient will benefit from continued OT services while admitted and after dc in order to maximize independence and safety with ADLs and mobility prior to dc home with family support. Will continue to follow while admitted.     Follow Up Recommendations  CIR;Supervision/Assistance - 24 hour    Equipment Recommendations  Other (comment)(TBD at next venue of care)    Recommendations for Other Services Rehab consult     Precautions / Restrictions Precautions Precautions: Fall Required Braces or Orthoses: Other Brace/Splint Other Brace/Splint: stump protector R LE  Restrictions Weight Bearing Restrictions: Yes RLE Weight Bearing: Non weight bearing      Mobility Bed Mobility Overal bed mobility: Needs Assistance Bed Mobility: Supine to Sit     Supine to sit: Min assist;HOB elevated     General bed mobility comments: using rail, cueing for sequencing, min support to ascend trunk, increased time and effort required  Transfers Overall transfer level: Needs assistance Equipment used: None Transfers: Sit to/from Stand Sit to Stand: Max assist;Mod assist;+2 physical assistance;+2 safety/equipment(using  stedy )         General transfer comment: attempted sit to stand from EOB, with max assist able to ascend hips ~2 inches, but unable to clear to complete full stand or squat pivot    Balance Overall balance assessment: Needs assistance Sitting-balance support: No upper extremity supported;Feet supported(L LE support) Sitting balance-Leahy Scale: Fair     Standing balance support: Bilateral upper extremity supported;During functional activity Standing balance-Leahy Scale: Zero Standing balance comment: reliant on BUE and external support                           ADL either performed or assessed with clinical judgement   ADL Overall ADL's : Needs assistance/impaired Eating/Feeding: Set up;Sitting   Grooming: Set up;Sitting   Upper Body Bathing: Set up;Sitting   Lower Body Bathing: Moderate assistance;Cueing for safety;Sitting/lateral leans   Upper Body Dressing : Set up;Sitting   Lower Body Dressing: +2 for physical assistance;+2 for safety/equipment;Total assistance;Sit to/from stand   Toilet Transfer: +2 for safety/equipment;+2 for physical assistance;Maximal assistance;Cueing for safety(using stedy) Simulated to recliner    Toileting- Clothing Manipulation and Hygiene: Total assistance;+2 for physical assistance;Sit to/from stand       Functional mobility during ADLs: +2 for physical assistance;+2 for safety/equipment;Maximal assistance General ADL Comments: Patient limited by decreased reach to R LE seated EOB, limited UB strength for transfers and fear of falling.  Required use of stedy for transfer, would benefit from drop arm 3:1 or recliner to complete side scoot or slideboard transfers.     Vision Baseline Vision/History: Wears glasses Wears Glasses: Reading only Patient Visual Report: No change from baseline Vision Assessment?:  No apparent visual deficits     Perception     Praxis      Pertinent Vitals/Pain Pain Assessment: Faces Faces Pain  Scale: Hurts even more Pain Location: RLE Pain Descriptors / Indicators: Grimacing;Guarding Pain Intervention(s): Limited activity within patient's tolerance;Repositioned;Monitored during session     Hand Dominance Right   Extremity/Trunk Assessment Upper Extremity Assessment Upper Extremity Assessment: Generalized weakness   Lower Extremity Assessment Lower Extremity Assessment: Defer to PT evaluation RLE Deficits / Details: s/p BKA   Cervical / Trunk Assessment Cervical / Trunk Assessment: Kyphotic   Communication Communication Communication: No difficulties   Cognition Arousal/Alertness: Awake/alert Behavior During Therapy: WFL for tasks assessed/performed;Anxious Overall Cognitive Status: Within Functional Limits for tasks assessed                                     General Comments       Exercises     Shoulder Instructions      Home Living Family/patient expects to be discharged to:: Private residence Living Arrangements: Alone Available Help at Discharge: Family;Available 24 hours/day Type of Home: House Home Access: Stairs to enter [employer] of Steps: 4 Entrance Stairs-Rails: Right;Left Home Layout: Able to live on main level with bedroom/bathroom     Bathroom Shower/Tub: Chief Strategy Officer: Standard     Home Equipment: Environmental consultant - 2 wheels;Wheelchair - manual;Cane - single point;Bedside commode          Prior Functioning/Environment Level of Independence: Independent        Comments: patient reports completes ADL (sponge bathes), limited IADLs         OT Problem List: Decreased strength;Decreased activity tolerance;Impaired balance (sitting and/or standing);Decreased safety awareness;Decreased knowledge of use of DME or AE;Decreased knowledge of precautions;Pain      OT Treatment/Interventions: Self-care/ADL training;Therapeutic exercise;Energy conservation;DME and/or AE instruction;Therapeutic  activities;Patient/family education;Balance training    OT Goals(Current goals can be found in the care plan section) Acute Rehab OT Goals Patient Stated Goal: independence OT Goal Formulation: With patient Time For Goal Achievement: 11/28/17 Potential to Achieve Goals: Fair  OT Frequency: Min 2X/week   Barriers to D/C:            Co-evaluation              AM-PAC PT "6 Clicks" Daily Activity     Outcome Measure Help from another person eating meals?: None Help from another person taking care of personal grooming?: A Little Help from another person toileting, which includes using toliet, bedpan, or urinal?: Total Help from another person bathing (including washing, rinsing, drying)?: A Little Help from another person to put on and taking off regular upper body clothing?: A Little Help from another person to put on and taking off regular lower body clothing?: Total 6 Click Score: 15   End of Session Equipment Utilized During Treatment: Gait belt(stedy) Nurse Communication: Mobility status;Need for lift equipment  Activity Tolerance: Patient tolerated treatment well Patient left: in chair;with call bell/phone within reach;with chair alarm set  OT Visit Diagnosis: Other abnormalities of gait and mobility (R26.89);Muscle weakness (generalized) (M62.81)                Time: 0950-1030 OT Time Calculation (min): 40 min Charges:  OT General Charges $OT Visit: 1 Visit OT Evaluation $OT Eval Moderate Complexity: 1 Mod OT Treatments $Self Care/Home Management : 8-22 mins G-Codes:  [name], OTR/L  Pager [phone]   Jamie MilroyChristie S Randen [name] 11/14/2017, 11:00 AM

## 2017-11-14 NOTE — Progress Notes (Addendum)
[facility] Progress Note   Subjective:  Seen in room. Tired after working with OT this am    Objective Vitals:   11/13/17 0509 11/13/17 1411 11/13/17 2024 11/14/17 0533  BP: (!) 166/74 (!) 157/79 (!) 155/69 (!) 151/64  Pulse: 86 88 83 79  Resp: 17 16 16 16   Temp: 98.5 F (36.9 C) 98.5 F (36.9 C) 98.9 F (37.2 C) 98.2 F (36.8 C)  TempSrc: Oral Oral Oral Oral  SpO2: 100%  100% 100%  Weight:      Height:       Physical Exam General: elderly female NAD  Heart: RRR  Lungs: CTAB  Abdomen: soft  Extremities: R BKA with stump protector in place. No LLE edema  Dialysis Access: R IJ [facility]   Dialysis Orders:  G[facility], TTS, 4 hours, 180 dialyzer, BFR 400, DFR 800, EDW 80 kg, 2K/2.25 calcium, linear sodium modeling, UF profile #2, right IJ TDC.Heparin bolus 5000 U Hectorol 2 just decreased 7/4 due to drop in iPTH Last Mircera 150 6/27 - hgb 9.4 21% sat ferritin 1600 - not on Fe   Assessment/Plan: 1.S/p right BKA 7/5 for nonhealing wound- per Dr. Lajoyce Corners - progressing well 2. ESRD- TTS -Continue on schedule. Next HD 7/9. 3. Anemia- hgb 9.4>7.7 Aranesp 150 scheduled for 7/11 - IV Fe 125 x 4 started  4. Secondary hyperparathyroidism- VDRA- recently lowered to 2 due to drip in iPTH, sensipar 90 and renvela 3 ac 5.HTN/volume- BP slighly elevated; titrate to new edw - not on any BP meds per outside records- had been getting below edw at outpt unit ; IDWG generally 1- 2 L.   6. Nutrition- Resume vits/nepro - eating well alb 2.4 7. Hx CVA 8. Disp - likely SNF   Ogechi Susann Givens PA-C [facility] Pager [phone] 11/14/2017,12:34 PM  LOS: 3 days   Pt seen, examined and agree w A/P as above.  Vinson Moselle MD [employer] pager [phone]   11/14/2017, 1:26 PM    Additional Objective Labs: Basic Metabolic Panel: Recent Labs  Lab 11/11/17 0648 11/12/17 0836  NA 139 138  K 3.6 3.8  CL 102 102  CO2 27 26  GLUCOSE 116*  126*  BUN 21 33*  CREATININE 4.56* 6.57*  CALCIUM 9.2 8.7*  PHOS  --  5.9*   CBC: Recent Labs  Lab 11/11/17 0648 11/12/17 0837  WBC 11.3* 12.4*  HGB 9.4* 7.7*  HCT 33.1* 27.1*  MCV 76.6* 76.3*  PLT 354 376   Blood Culture    Component Value Date/Time   SDES  10/11/2017 1525    FOOT RIGHT Performed at [facility], [address], [place], [place] [address]    SPECREQUEST  10/11/2017 1525    NONE Performed at [facility], [address], [place], [place] [address]    CULT  10/11/2017 1525    MODERATE ESCHERICHIA COLI FEW METHICILLIN RESISTANT STAPHYLOCOCCUS AUREUS    REPTSTATUS 10/14/2017 FINAL 10/11/2017 1525    Cardiac Enzymes: No results for input(s): CKTOTAL, CKMB, CKMBINDEX, TROPONINI in the last 168 hours. CBG: Recent Labs  Lab 11/11/17 0647 11/11/17 0829 11/11/17 1153  GLUCAP 115* 99 96   Iron Studies: No results for input(s): IRON, TIBC, TRANSFERRIN, FERRITIN in the last 72 hours. Lab Results  Component Value Date   INR 1.02 07/08/2015   INR 0.95 12/14/2013   INR 1.21 10/31/2012   Medications: . sodium chloride 10 mL/hr at 11/12/17 1208  . sodium  chloride    . sodium chloride    . ferric gluconate (FERRLECIT/NULECIT) IV 125 mg (11/12/17 1210)  . methocarbamol (ROBAXIN)  IV     . aspirin  325 mg Oral Daily  . Chlorhexidine Gluconate Cloth  6 each Topical Q0600  . cinacalcet  90 mg Oral Q supper  . docusate sodium  100 mg Oral BID  . doxercalciferol  2 mcg Intravenous Q T,Th,Sa-HD  . feeding supplement (NEPRO CARB STEADY)  237 mL Oral BID BM  . multivitamin  1 tablet Oral QHS  . sevelamer carbonate  2,400 mg Oral TID WC

## 2017-11-15 DIAGNOSIS — E785 Hyperlipidemia, unspecified: Secondary | ICD-10-CM | POA: Diagnosis present

## 2017-11-15 DIAGNOSIS — Z89511 Acquired absence of right leg below knee: Secondary | ICD-10-CM | POA: Diagnosis not present

## 2017-11-15 DIAGNOSIS — S31829A Unspecified open wound of left buttock, initial encounter: Secondary | ICD-10-CM | POA: Diagnosis not present

## 2017-11-15 DIAGNOSIS — E1140 Type 2 diabetes mellitus with diabetic neuropathy, unspecified: Secondary | ICD-10-CM | POA: Diagnosis not present

## 2017-11-15 DIAGNOSIS — Z823 Family history of stroke: Secondary | ICD-10-CM | POA: Diagnosis not present

## 2017-11-15 DIAGNOSIS — M6281 Muscle weakness (generalized): Secondary | ICD-10-CM | POA: Diagnosis not present

## 2017-11-15 DIAGNOSIS — G459 Transient cerebral ischemic attack, unspecified: Secondary | ICD-10-CM | POA: Diagnosis not present

## 2017-11-15 DIAGNOSIS — Z7401 Bed confinement status: Secondary | ICD-10-CM | POA: Diagnosis not present

## 2017-11-15 DIAGNOSIS — Z4781 Encounter for orthopedic aftercare following surgical amputation: Secondary | ICD-10-CM | POA: Diagnosis not present

## 2017-11-15 DIAGNOSIS — I6523 Occlusion and stenosis of bilateral carotid arteries: Secondary | ICD-10-CM | POA: Diagnosis not present

## 2017-11-15 DIAGNOSIS — Z992 Dependence on renal dialysis: Secondary | ICD-10-CM | POA: Diagnosis not present

## 2017-11-15 DIAGNOSIS — E669 Obesity, unspecified: Secondary | ICD-10-CM | POA: Diagnosis present

## 2017-11-15 DIAGNOSIS — N2581 Secondary hyperparathyroidism of renal origin: Secondary | ICD-10-CM | POA: Diagnosis not present

## 2017-11-15 DIAGNOSIS — L89620 Pressure ulcer of left heel, unstageable: Secondary | ICD-10-CM | POA: Diagnosis present

## 2017-11-15 DIAGNOSIS — M869 Osteomyelitis, unspecified: Secondary | ICD-10-CM | POA: Diagnosis not present

## 2017-11-15 DIAGNOSIS — I739 Peripheral vascular disease, unspecified: Secondary | ICD-10-CM | POA: Diagnosis not present

## 2017-11-15 DIAGNOSIS — I120 Hypertensive chronic kidney disease with stage 5 chronic kidney disease or end stage renal disease: Secondary | ICD-10-CM | POA: Diagnosis not present

## 2017-11-15 DIAGNOSIS — E1129 Type 2 diabetes mellitus with other diabetic kidney complication: Secondary | ICD-10-CM | POA: Diagnosis not present

## 2017-11-15 DIAGNOSIS — G458 Other transient cerebral ischemic attacks and related syndromes: Secondary | ICD-10-CM | POA: Diagnosis not present

## 2017-11-15 DIAGNOSIS — S71101A Unspecified open wound, right thigh, initial encounter: Secondary | ICD-10-CM | POA: Diagnosis not present

## 2017-11-15 DIAGNOSIS — I4891 Unspecified atrial fibrillation: Secondary | ICD-10-CM | POA: Diagnosis not present

## 2017-11-15 DIAGNOSIS — I953 Hypotension of hemodialysis: Secondary | ICD-10-CM | POA: Diagnosis not present

## 2017-11-15 DIAGNOSIS — R488 Other symbolic dysfunctions: Secondary | ICD-10-CM | POA: Diagnosis not present

## 2017-11-15 DIAGNOSIS — I959 Hypotension, unspecified: Secondary | ICD-10-CM | POA: Diagnosis not present

## 2017-11-15 DIAGNOSIS — D631 Anemia in chronic kidney disease: Secondary | ICD-10-CM | POA: Diagnosis not present

## 2017-11-15 DIAGNOSIS — L89622 Pressure ulcer of left heel, stage 2: Secondary | ICD-10-CM | POA: Diagnosis not present

## 2017-11-15 DIAGNOSIS — Z87891 Personal history of nicotine dependence: Secondary | ICD-10-CM | POA: Diagnosis not present

## 2017-11-15 DIAGNOSIS — M2550 Pain in unspecified joint: Secondary | ICD-10-CM | POA: Diagnosis not present

## 2017-11-15 DIAGNOSIS — Z95828 Presence of other vascular implants and grafts: Secondary | ICD-10-CM | POA: Diagnosis not present

## 2017-11-15 DIAGNOSIS — D509 Iron deficiency anemia, unspecified: Secondary | ICD-10-CM | POA: Diagnosis not present

## 2017-11-15 DIAGNOSIS — E8889 Other specified metabolic disorders: Secondary | ICD-10-CM | POA: Diagnosis present

## 2017-11-15 DIAGNOSIS — Y792 Prosthetic and other implants, materials and accessory orthopedic devices associated with adverse incidents: Secondary | ICD-10-CM | POA: Diagnosis present

## 2017-11-15 DIAGNOSIS — R531 Weakness: Secondary | ICD-10-CM | POA: Diagnosis not present

## 2017-11-15 DIAGNOSIS — E119 Type 2 diabetes mellitus without complications: Secondary | ICD-10-CM | POA: Diagnosis not present

## 2017-11-15 DIAGNOSIS — Z8673 Personal history of transient ischemic attack (TIA), and cerebral infarction without residual deficits: Secondary | ICD-10-CM | POA: Diagnosis not present

## 2017-11-15 DIAGNOSIS — T8781 Dehiscence of amputation stump: Secondary | ICD-10-CM | POA: Diagnosis not present

## 2017-11-15 DIAGNOSIS — D638 Anemia in other chronic diseases classified elsewhere: Secondary | ICD-10-CM | POA: Diagnosis not present

## 2017-11-15 DIAGNOSIS — S31819A Unspecified open wound of right buttock, initial encounter: Secondary | ICD-10-CM | POA: Diagnosis not present

## 2017-11-15 DIAGNOSIS — Z833 Family history of diabetes mellitus: Secondary | ICD-10-CM | POA: Diagnosis not present

## 2017-11-15 DIAGNOSIS — E1151 Type 2 diabetes mellitus with diabetic peripheral angiopathy without gangrene: Secondary | ICD-10-CM | POA: Diagnosis not present

## 2017-11-15 DIAGNOSIS — L97401 Non-pressure chronic ulcer of unspecified heel and midfoot limited to breakdown of skin: Secondary | ICD-10-CM | POA: Diagnosis not present

## 2017-11-15 DIAGNOSIS — E1122 Type 2 diabetes mellitus with diabetic chronic kidney disease: Secondary | ICD-10-CM | POA: Diagnosis not present

## 2017-11-15 DIAGNOSIS — N186 End stage renal disease: Secondary | ICD-10-CM | POA: Diagnosis not present

## 2017-11-15 DIAGNOSIS — G629 Polyneuropathy, unspecified: Secondary | ICD-10-CM | POA: Diagnosis not present

## 2017-11-15 DIAGNOSIS — R29810 Facial weakness: Secondary | ICD-10-CM | POA: Diagnosis not present

## 2017-11-15 DIAGNOSIS — I96 Gangrene, not elsewhere classified: Secondary | ICD-10-CM | POA: Diagnosis not present

## 2017-11-15 DIAGNOSIS — Z6831 Body mass index (BMI) 31.0-31.9, adult: Secondary | ICD-10-CM | POA: Diagnosis not present

## 2017-11-15 DIAGNOSIS — D62 Acute posthemorrhagic anemia: Secondary | ICD-10-CM | POA: Diagnosis not present

## 2017-11-15 DIAGNOSIS — I7025 Atherosclerosis of native arteries of other extremities with ulceration: Secondary | ICD-10-CM | POA: Diagnosis not present

## 2017-11-15 DIAGNOSIS — R5381 Other malaise: Secondary | ICD-10-CM | POA: Diagnosis not present

## 2017-11-15 DIAGNOSIS — L89629 Pressure ulcer of left heel, unspecified stage: Secondary | ICD-10-CM | POA: Diagnosis not present

## 2017-11-15 LAB — TYPE AND SCREEN
ABO/RH(D): O POS
Antibody Screen: POSITIVE
DAT, IGG: POSITIVE
PT AG Type: NEGATIVE
UNIT DIVISION: 0
UNIT DIVISION: 0
Unit division: 0

## 2017-11-15 LAB — BPAM RBC
BLOOD PRODUCT EXPIRATION DATE: 201908062359
Blood Product Expiration Date: 201908062359
Blood Product Expiration Date: 201908062359
UNIT TYPE AND RH: 5100
Unit Type and Rh: 5100
Unit Type and Rh: 5100

## 2017-11-15 LAB — CBC
HCT: 26.8 % — ABNORMAL LOW (ref 36.0–46.0)
HEMOGLOBIN: 7.6 g/dL — AB (ref 12.0–15.0)
MCH: 21.3 pg — ABNORMAL LOW (ref 26.0–34.0)
MCHC: 28.4 g/dL — ABNORMAL LOW (ref 30.0–36.0)
MCV: 75.3 fL — ABNORMAL LOW (ref 78.0–100.0)
PLATELETS: 437 10*3/uL — AB (ref 150–400)
RBC: 3.56 MIL/uL — AB (ref 3.87–5.11)
RDW: 19.5 % — ABNORMAL HIGH (ref 11.5–15.5)
WBC: 11.8 10*3/uL — AB (ref 4.0–10.5)

## 2017-11-15 LAB — RENAL FUNCTION PANEL
ALBUMIN: 2.2 g/dL — AB (ref 3.5–5.0)
Anion gap: 11 (ref 5–15)
BUN: 42 mg/dL — ABNORMAL HIGH (ref 8–23)
CALCIUM: 7.9 mg/dL — AB (ref 8.9–10.3)
CO2: 27 mmol/L (ref 22–32)
CREATININE: 8.88 mg/dL — AB (ref 0.44–1.00)
Chloride: 97 mmol/L — ABNORMAL LOW (ref 98–111)
GFR calc non Af Amer: 4 mL/min — ABNORMAL LOW (ref >60)
GFR, EST AFRICAN AMERICAN: 4 mL/min — AB (ref >60)
Glucose, Bld: 117 mg/dL — ABNORMAL HIGH (ref 70–99)
Phosphorus: 4.9 mg/dL — ABNORMAL HIGH (ref 2.5–4.6)
Potassium: 4.2 mmol/L (ref 3.5–5.1)
SODIUM: 135 mmol/L (ref 135–145)

## 2017-11-15 MED ORDER — OXYCODONE-ACETAMINOPHEN 5-325 MG PO TABS: 1.0000 | ORAL_TABLET | ORAL | 0 refills | Status: DC | PRN

## 2017-11-15 MED ORDER — DOXERCALCIFEROL 4 MCG/2ML IV SOLN
INTRAVENOUS | Status: AC
  Filled 2017-11-15: qty 2

## 2017-11-15 MED ORDER — OXYCODONE HCL 5 MG PO TABS
ORAL_TABLET | ORAL | Status: AC
  Filled 2017-11-15: qty 3

## 2017-11-15 MED ORDER — DARBEPOETIN ALFA 150 MCG/0.3ML IJ SOSY
PREFILLED_SYRINGE | INTRAMUSCULAR | Status: AC
  Filled 2017-11-15: qty 0.3

## 2017-11-15 NOTE — Progress Notes (Addendum)
[facility] Progress Note   Subjective:  Seen on HD. UF goal 2L  Tolerating. No complaints.  Objective Vitals:   11/15/17 1000 11/15/17 1030 11/15/17 1100 11/15/17 1121  BP: 100/70 129/74 110/73   Pulse: 80 81 80   Resp: 16 16 16    Temp:    98.5 F (36.9 C)  TempSrc:      SpO2:    100%  Weight:    79 kg (174 lb 2.6 oz)  Height:       Physical Exam General: elderly female NAD  Heart: RRR  Lungs: CTAB  Abdomen: soft  Extremities: R BKA with stump protector in place. No LLE edema  [facility]   Dialysis Orders:  GOC, TTS, 4 hours, 180 dialyzer, BFR 400, DFR 800, EDW 80 kg, 2K/2.25 calcium, linear sodium modeling, UF profile #2, right IJ TDC.Heparin bolus 5000 U Hectorol 2 just decreased 7/4 due to drop in iPTH Last Mircera 150 6/27 - hgb 9.4 21% sat ferritin 1600 - not on Fe   Assessment/Plan: 1.S/p right BKA 7/5 for nonhealing wound- per Dr. Lajoyce Cornersuda - progressing well 2. ESRD- TTS -Continue on schedule. HD today.  3. Anemia- hgb 9.4>7.9 post op Aranesp 150 scheduled for 7/11 - IV Fe 125 x 4 started  4. Secondary hyperparathyroidism- VDRA- recently lowered to 2 due to drip in iPTH, sensipar 90 and renvela 3 ac 5.HTN/volume- BP low today. Establishing new EDW - not on any BP meds per outside records- had been getting below edw at outpt unit ; IDWG generally 1- 2 L.   6. Nutrition- Vitamins/nepro supps- eating well alb 2.4 7. Hx CVA 8. Disp - For SNF   Ogechi Susann GivensGrace Ejigiri PA-C [facility] Pager [phone] 11/15/2017,11:37 AM  LOS: 4 days   Pt seen, examined and agree w A/P as above.  Vinson Moselleob Afshin Chrystal MD [facility] pager [phone]   11/15/2017, 1:02 PM     Additional Objective Labs: Basic Metabolic Panel: Recent Labs  Lab 11/11/17 0648 11/12/17 0836 11/15/17 0734  NA 139 138 135  K 3.6 3.8 4.2  CL 102 102 97*  CO2 27 26 27   GLUCOSE 116* 126* 117*  BUN 21 33* 42*  CREATININE 4.56*  6.57* 8.88*  CALCIUM 9.2 8.7* 7.9*  PHOS  --  5.9* 4.9*   CBC: Recent Labs  Lab 11/11/17 0648 11/12/17 0837 11/15/17 0734  WBC 11.3* 12.4* 11.8*  HGB 9.4* 7.7* 7.6*  HCT 33.1* 27.1* 26.8*  MCV 76.6* 76.3* 75.3*  PLT 354 376 437*   Blood Culture    Component Value Date/Time   SDES  10/11/2017 1525    FOOT RIGHT Performed at [facility], [address] [place], [place] [address]    SPECREQUEST  10/11/2017 1525    NONE Performed at [facility], [address] [place], [place] [address]    CULT  10/11/2017 1525    MODERATE ESCHERICHIA COLI FEW METHICILLIN RESISTANT STAPHYLOCOCCUS AUREUS    REPTSTATUS 10/14/2017 FINAL 10/11/2017 1525    Cardiac Enzymes: No results for input(s): CKTOTAL, CKMB, CKMBINDEX, TROPONINI in the last 168 hours. CBG: Recent Labs  Lab 11/11/17 0647 11/11/17 0829 11/11/17 1153  GLUCAP 115* 99 96   Iron Studies: No results for input(s): IRON, TIBC, TRANSFERRIN, FERRITIN in the last 72 hours. Lab Results  Component Value Date   INR 1.02 07/08/2015   INR 0.95 12/14/2013   INR 1.21 10/31/2012   Medications: . sodium  chloride 10 mL/hr at 11/12/17 1208  . ferric gluconate (FERRLECIT/NULECIT) IV 125 mg (11/15/17 1040)  . methocarbamol (ROBAXIN)  IV     . aspirin  325 mg Oral Daily  . Chlorhexidine Gluconate Cloth  6 each Topical Q0600  . cinacalcet  90 mg Oral Q supper  . Darbepoetin Alfa      . darbepoetin (ARANESP) injection - DIALYSIS  150 mcg Intravenous Q Tue-HD  . docusate sodium  100 mg Oral BID  . doxercalciferol      . doxercalciferol  2 mcg Intravenous Q T,Th,Sa-HD  . feeding supplement (NEPRO CARB STEADY)  237 mL Oral BID BM  . multivitamin  1 tablet Oral QHS  . sevelamer carbonate  2,400 mg Oral TID WC

## 2017-11-15 NOTE — Progress Notes (Signed)
Pt [facility]. [facility] to [facility]. Wound vac changed to Prevena upon discharge. No c/o pain or discomfort at this time. Awaiting transportation.

## 2017-11-15 NOTE — Progress Notes (Signed)
Patient ID: Jamie LippsBetty G Tolsma, female   DOB: 05/14/1932, 84 y.o.   MRN: [id number] Patient alert and oriented this morning.  No new drainage in the wound VAC canister.  Inpatient rehabilitation now states the patient is not a good candidate for inpatient rehabilitation.  Patient states she wants to go [facility].

## 2017-11-15 NOTE — Plan of Care (Signed)
  Problem: Education: Goal: Knowledge of General Education information will improve Outcome: Progressing   Problem: Activity: Goal: Risk for activity intolerance will decrease Outcome: Progressing   Problem: Elimination: Goal: Will not experience complications related to bowel motility Outcome: Progressing   Problem: Pain Managment: Goal: General experience of comfort will improve Outcome: Progressing   Problem: Skin Integrity: Goal: Risk for impaired skin integrity will decrease Outcome: Progressing   

## 2017-11-15 NOTE — Progress Notes (Signed)
Physical Therapy Treatment Patient Details Name: Jamie LippsBetty G Rorrer MRN: [id number] DOB: 11/18/1932 Today's Date: 11/15/2017    History of Present Illness Pt is an 84 year old female s/p R BKA. PMH consists of DM, diabetic neuropathy, chronic kidney disease (HD TTS), CVA, despression, and OA.     PT Comments    Pt performed supine exercises to improve strength and function in B LEs.  Pt with plan [facility] for continued [facility] before returning to private residence.  Pt remains motivated to progress.     Follow Up Recommendations  CIR     Equipment Recommendations  None recommended by PT;Other (comment)(defer to next venue)    Recommendations for Other Services Rehab consult     Precautions / Restrictions Precautions Precautions: Fall Required Braces or Orthoses: Other Brace/Splint Other Brace/Splint: Limb guard R LE  Restrictions Weight Bearing Restrictions: Yes RLE Weight Bearing: Non weight bearing Other Position/Activity Restrictions: limb guard in place    Mobility  Bed Mobility Overal bed mobility: Needs Assistance Bed Mobility: Rolling Rolling: Min assist         General bed mobility comments: Pt performed rolling to R and L side for perianal care.  Post pericare assisted patient with boosting to [facility].    Transfers                    Ambulation/Gait                 Stairs             Wheelchair Mobility    Modified Rankin (Stroke Patients Only)       Balance Overall balance assessment: Needs assistance Sitting-balance support: No upper extremity supported;Feet supported Sitting balance-Leahy Scale: Poor       Standing balance-Leahy Scale: Zero Standing balance comment: reliant on BUE and external support                            Cognition Arousal/Alertness: Awake/alert Behavior During Therapy: WFL for tasks assessed/performed;Anxious Overall Cognitive Status: Within Functional Limits for tasks  assessed                                        Exercises Total Joint Exercises Ankle Circles/Pumps: AROM;Left;20 reps;Supine Quad Sets: AROM;Both;10 reps;Supine Heel Slides: AROM;10 reps;Supine;Left Hip ABduction/ADduction: AROM;Both;10 reps;Supine Straight Leg Raises: AROM;Both;10 reps;Supine Knee Flexion: AROM;Right;10 reps;Supine    General Comments        Pertinent Vitals/Pain Pain Assessment: 0-10 Pain Score: 5  Pain Location: RLE Pain Descriptors / Indicators: Grimacing;Guarding Pain Intervention(s): Monitored during session;Repositioned    Home Living                      Prior Function            PT Goals (current goals can now be found in the care plan section) Acute Rehab PT Goals Patient Stated Goal: independence Potential to Achieve Goals: Good Progress towards PT goals: Progressing toward goals    Frequency    Min 3X/week      PT Plan Current plan remains appropriate    Co-evaluation              AM-PAC PT "6 Clicks" Daily Activity  Outcome Measure  Difficulty turning over in bed (including adjusting bedclothes, sheets and blankets)?: A  Lot Difficulty moving from lying on back to sitting on the side of the bed? : A Lot Difficulty sitting down on and standing up from a chair with arms (e.g., wheelchair, bedside commode, etc,.)?: Unable Help needed moving to and from a bed to chair (including a wheelchair)?: A Lot Help needed walking in hospital room?: Total Help needed climbing 3-5 steps with a railing? : Total 6 Click Score: 9    End of Session Equipment Utilized During Treatment: Gait belt Activity Tolerance: Patient tolerated treatment well Patient left: in chair;with call bell/phone within reach;with family/visitor present Nurse Communication: Mobility status PT Visit Diagnosis: Other abnormalities of gait and mobility (R26.89);Pain;Difficulty in walking, not elsewhere classified (R26.2) Pain -  Right/Left: Right Pain - part of body: Leg     Time: 1409-1430 PT Time Calculation (min) (ACUTE ONLY): 21 min  Charges:  $Therapeutic Exercise: 8-22 mins                    G Codes:       Joycelyn Rua, PTA pager [phone]    Florestine Avers 11/15/2017, 2:40 PM

## 2017-11-15 NOTE — Clinical Social Work Placement (Signed)
   CLINICAL SOCIAL WORK PLACEMENT  NOTE  Date:  11/15/2017  Patient Details  Name: [name] MRN: [id number] Date of Birth: 11/22/1932  Clinical Social Work is seeking post-discharge placement for this patient at the Skilled  Nursing Facility level of care (*CSW will initial, date and re-position this form in  chart as items are completed):  Yes   Patient/family provided with [place] Clinical Social Work Department's list of facilities offering this level of care within the geographic area requested by the patient (or if unable, by the patient's family).  Yes   Patient/family informed of their freedom to choose among providers that offer the needed level of care, that participate in Medicare, Medicaid or managed care program needed by the patient, have an available bed and are willing to accept the patient.  Yes   Patient/family informed of [facility]'s ownership interest in [facility] and [facility], as well as of the fact that they are under no obligation to receive care at these facilities.  PASRR submitted to EDS on       PASRR number received on       Existing PASRR number confirmed on 11/14/17     FL2 transmitted to all facilities in geographic area requested by pt/family on 11/14/17     FL2 transmitted to all facilities within larger geographic area on       Patient informed that his/her managed care company has contracts with or will negotiate with certain facilities, including the following:        Yes   Patient/family informed of bed offers received.  [facility]     Physician recommends and patient chooses bed at      [facility] 11[facility].  Patient to be transferred to facility by ptar     Patient family notified on 11/15/17 of transfer.  Name of family member notified:  myrtle     PHYSICIAN Please sign FL2, Please prepare prescriptions     Additional  Comment:    _______________________________________________ Burna SisUris, [place] Francisco H, LCSW 11/15/2017, 12:41 PM

## 2017-11-15 NOTE — Discharge Summary (Signed)
Discharge Diagnoses:  Active Problems:   Gangrene of right foot (HCC)   Below knee amputation status, right (HCC)   Unilateral complete BKA, right, initial encounter (HCC)   Diabetes mellitus type 2 in obese (HCC)   History of CVA (cerebrovascular accident)   Post-operative pain   Acute blood loss anemia   Anemia of chronic disease   Benign essential HTN   Morbid obesity (HCC)   Surgeries: Procedure(s): RIGHT BELOW KNEE AMPUTATION on 11/11/2017    Consultants: Treatment Team:  Zetta BillsPatel, Jay, MD Delano MetzSchertz, Robert, MD  Discharged Condition: Improved  Hospital Course: [name] is an 84 y.o. female who was admitted 11/11/2017 with a chief complaint of gangrene right foot, with a final diagnosis of Gangrene Right Foot.  Patient was brought to the operating room on 11/11/2017 and underwent Procedure(s): RIGHT BELOW KNEE AMPUTATION.    Patient was given perioperative antibiotics:  Anti-infectives (From admission, onward)   Start     Dose/Rate Route Frequency Ordered Stop   11/11/17 1306  ceFAZolin (ANCEF) IVPB 1 g/50 mL premix     1 g 100 mL/hr over 30 Minutes Intravenous Every 24 hours 11/11/17 1204 11/11/17 1630   11/11/17 0645  ceFAZolin (ANCEF) IVPB 2g/100 mL premix     2 g 200 mL/hr over 30 Minutes Intravenous On call to O.R. 11/11/17 [id number] 11/11/17 1110   11/11/17 0639  ceFAZolin (ANCEF) 2-4 GM/100ML-% IVPB    Note to Pharmacy:  Clydene LamingNewell, Morgan   : cabinet override      11/11/17 0639 11/11/17 1110    .  Patient was given sequential compression devices, early ambulation, and aspirin for DVT prophylaxis.  Recent vital signs:  Patient Vitals for the past 24 hrs:  BP Temp Temp src Pulse Resp SpO2 Weight  11/15/17 1030 129/74 - - 81 16 - -  11/15/17 0900 131/70 - - 80 18 - -  11/15/17 0830 106/73 - - 81 16 - -  11/15/17 0800 (!) 143/77 - - 82 18 - -  11/15/17 0730 132/63 - - 80 18 - -  11/15/17 0721 (!) 143/67 - - 80 16 - -  11/15/17 0710 (!) 167/70 98.6 F (37 C) Oral 84 -  100 % 176 lb 5.9 oz (80 kg)  11/15/17 0500 (!) 148/70 98.5 F (36.9 C) Axillary 80 - 100 % -  11/14/17 2042 (!) 141/67 98.3 F (36.8 C) Oral 86 16 100 % -  11/14/17 1458 (!) 152/63 98.7 F (37.1 C) Oral 91 18 99 % -  .  Recent laboratory studies: No results found.  Discharge Medications:   Allergies as of 11/15/2017      Reactions   Codeine Hives   Vancomycin Rash      Medication List    STOP taking these medications   acetaminophen 500 MG tablet Commonly known as:  TYLENOL   clindamycin 300 MG capsule Commonly known as:  CLEOCIN   HYDROcodone-acetaminophen 5-325 MG tablet Commonly known as:  NORCO   meperidine 50 MG tablet Commonly known as:  DEMEROL   metroNIDAZOLE 500 MG tablet Commonly known as:  FLAGYL   nitroGLYCERIN 0.1 mg/hr patch Commonly known as:  NITRO-DUR     TAKE these medications   clopidogrel 75 MG tablet Commonly known as:  PLAVIX Take 1 tablet (75 mg total) by mouth daily.   collagenase ointment Commonly known as:  SANTYL Apply 1 application topically daily.   gabapentin 100 MG capsule Commonly known as:  NEURONTIN Take 100 mg  by mouth at bedtime.   HECTOROL 2 MCG/ML Soln Generic drug:  Doxercalciferol Inject 3 mcg into the vein 3 (three) times a week.   IRON SUCROSE IV Inject 1 Dose into the vein once a week.   MIRCERA 75 MCG/0.3ML Sosy Generic drug:  Methoxy PEG-Epoetin Beta Inject 75 mcg as directed every 14 (fourteen) days.   mupirocin ointment 2 % Commonly known as:  BACTROBAN Apply 1 application topically 2 (two) times daily.   oxyCODONE-acetaminophen 5-325 MG tablet Commonly known as:  PERCOCET/ROXICET Take 1 tablet by mouth every 4 (four) hours as needed. What changed:  when to take this   RENVELA 800 MG tablet Generic drug:  sevelamer carbonate Take 1,600-2,400 mg by mouth See admin instructions. Take 2400 mg by mouth 3 times daily with meals and take 1600 mg by mouth with snacks   SENSIPAR 30 MG tablet Generic  drug:  cinacalcet Take 30 mg by mouth every evening.   silver sulfADIAZINE 1 % cream Commonly known as:  SILVADENE Apply 1 application topically daily.       Diagnostic Studies: Dg Foot Complete Right  Result Date: 10/17/2017 CLINICAL DATA:  Non healing ulcer of right medial foot. Questioning osteomyelitis. Patient had right big toe amputated 3 weeks ago. EXAM: RIGHT FOOT COMPLETE - 3+ VIEW COMPARISON:  Plain film of the RIGHT foot dated 09/26/2017. FINDINGS: Amputation site at the base of the RIGHT first metatarsal bone appears stable compared to the earlier plain film of 09/26/2017. Remainder of the osseous structures are also stable in appearance. No new destructive change or demineralization to suggest osteomyelitis. Expected soft tissue changes adjacent to the amputation site. IMPRESSION: No acute findings. No plain film evidence of osteomyelitis. If clinical concern for osteomyelitis persists, consider MRI for more definitive characterization. Electronically Signed   By: Bary Richard M.D.   On: 10/17/2017 14:30    Patient benefited maximally from their hospital stay and there were no complications.     Disposition: Discharge disposition: 03-Skilled Nursing Facility      Discharge Instructions    Call MD / Call 911   Complete by:  As directed    If you experience chest pain or shortness of breath, CALL 911 and be transported to the hospital emergency room.  If you develope a fever above 101 F, pus (white drainage) or increased drainage or redness at the wound, or calf pain, call your surgeon's office.   Constipation Prevention   Complete by:  As directed    Drink plenty of fluids.  Prune juice may be helpful.  You may use a stool softener, such as Colace (over the counter) 100 mg twice a day.  Use MiraLax (over the counter) for constipation as needed.   Diet - low sodium heart healthy   Complete by:  As directed    Increase activity slowly as tolerated   Complete by:  As  directed    Negative Pressure Wound Therapy - Incisional   Complete by:  As directed    Connect vac dressing to prevena plus vac pump, obtain from the operating room, may be available from central supply      Contact information for follow-up providers    Nadara Mustard, MD In 2 weeks.   Specialty:  Orthopedic Surgery Contact information: [address] [place] [address] [phone]            Contact information for after-discharge care    Destination    [facility]  SNF .   Service:  Skilled Nursing Contact information: [address] [place] [address] [phone]                   Signed: Nadara Mustard 11/15/2017, 10:47 AM

## 2017-11-15 NOTE — Progress Notes (Signed)
[facility] Anticipated discharge date: 7/9 Family notified: Glory RosebushMyrtle Harris Transportation by Sharin MonsPTAR- scheduled for 2:30pm Report#: [phone] rm 220  CSW signing off.  Burna SisJenna H. Ripken Rekowski, LCSW Clinical Social Worker [phone]

## 2017-11-16 ENCOUNTER — Encounter: Payer: Self-pay | Admitting: Internal Medicine

## 2017-11-16 ENCOUNTER — Non-Acute Institutional Stay (SKILLED_NURSING_FACILITY): Payer: Medicare Other | Admitting: Internal Medicine

## 2017-11-16 DIAGNOSIS — N186 End stage renal disease: Secondary | ICD-10-CM

## 2017-11-16 DIAGNOSIS — E1151 Type 2 diabetes mellitus with diabetic peripheral angiopathy without gangrene: Secondary | ICD-10-CM

## 2017-11-16 DIAGNOSIS — D638 Anemia in other chronic diseases classified elsewhere: Secondary | ICD-10-CM | POA: Diagnosis not present

## 2017-11-16 DIAGNOSIS — Z89511 Acquired absence of right leg below knee: Secondary | ICD-10-CM | POA: Diagnosis not present

## 2017-11-16 DIAGNOSIS — IMO0002 Reserved for concepts with insufficient information to code with codable children: Secondary | ICD-10-CM

## 2017-11-16 DIAGNOSIS — Z992 Dependence on renal dialysis: Secondary | ICD-10-CM | POA: Diagnosis not present

## 2017-11-16 DIAGNOSIS — I96 Gangrene, not elsewhere classified: Secondary | ICD-10-CM | POA: Diagnosis not present

## 2017-11-16 NOTE — Assessment & Plan Note (Signed)
Wound care nurse to monitor post BKA status @ SNF

## 2017-11-16 NOTE — Assessment & Plan Note (Addendum)
With  follow-up as scheduled by Dr.Duda

## 2017-11-16 NOTE — Progress Notes (Signed)
It looks like Tammy made one this morning

## 2017-11-16 NOTE — Consult Note (Signed)
            [facility] Primary Care Navigator  11/16/2017  Clint LippsBetty G Lesage 11/14/1932 [id number]   Went to see patient at the bedside to identify possible discharge needs butshe wasalreadydischarged yesterday. Patientwas discharged to skilled nursing facility (SNF) per therapy recommendation.  Per chart review, patient presented with gangrene of right foot, underwent below the knee amputation (right).  Patient has discharge instruction to follow-up with orthopedic surgery in 2 weeks.  Primary care provider's office is listed as providing transition of care (TOC) follow-up.   For additional questions please contact:  Karin GoldenLorraine A. Rebekah Sprinkle, BSN, RN-BC [facility] PRIMARY CARE Navigator Cell: [phone]

## 2017-11-16 NOTE — Assessment & Plan Note (Addendum)
Request routine labs ordered by Dr Arrie Aranoladonato done with dialysis Request update of iron panel at dialysis if possible

## 2017-11-16 NOTE — Assessment & Plan Note (Signed)
Possible discordance between A1c value of 5.7% on 11/11/17 and serum glucoses in the context of end-stage renal disease

## 2017-11-16 NOTE — Progress Notes (Signed)
NURSING HOME LOCATION:  [facility] ROOM NUMBER:  [id number]  CODE STATUS:  Full Code  PCP:  Mila PalmerWolters, Sharon, MD  [address] [place] [id number]  This is a comprehensive admission note to [facility] performed on this date less than 30 days from date of admission. Included are preadmission medical/surgical history; reconciled medication list; family history; social history and comprehensive review of systems.  Corrections and additions to the records were documented. Comprehensive physical exam was also performed. Additionally a clinical summary was entered for each active diagnosis pertinent to this admission in the Problem List to enhance continuity of care.  HPI:  The patient underwent right BKA amputation for gangrene of the right foot 11/11/17 by Dr. Aldean BakerMarcus Duda. Patient received perioperative antibiotic therapy with IV Ancef. The gangrene was in the context of peripheral arterial disease as well as end-stage renal disease on dialysis. No perioperative complications were described. Discharge labs 7/9  revealed a creatinine of 8.88 and BUN of 42. GFR was 4. As expected the patient has anemia with hemoglobin of 7.6 and hematocrit 26.8. Indices indicate hypochromic, microcytic values. There are no current iron status assessment labs in Epic. The last values were 12/19/2006. At that time ferritin was high but iron was 26. Highest glucose while hospitalized was 126.A1c is current @ 5.7%. There may be discordance of glucoses to A1c in context of ESRD.  Past medical and surgical history: Includes peripheral arterial disease, diabetes, history of stroke, osteoarthritis,secondary hyperparathyroidism, history of gout, GERD, depression, and history of C. difficile diarrhea. The patient has had multiple vascular procedures including aortogram and lower extremity angiography. Patient has also had a total abdominal hysterectomy.  Social history: Nondrinker; former  smoker.  Family history: Cancer in both father and brother. Mother had a stroke and sister diabetes.   Review of systems: With the surgical procedure and dialysis the patient's significant left lower extremity edema has improved with associated weight loss. She describes some generalized weakness which is improving. She states that when she lies supine she is restless but cannot define why. She denies paroxysmal nocturnal dyspnea. She also denies apnea or significant snoring. The patient is anuric in the context of dialysis. Despite the microcytic/ hypochromic anemia, she denies any bleeding dyscrasias beyond occasional blood on the tissue after BM. Her son states she has had a colonoscopy remotely which was negative. Constitutional: No fever, fatigue  Eyes: No redness, discharge, pain, vision change ENT/mouth: No nasal congestion, purulent discharge, earache, change in hearing, sore throat  Cardiovascular: No chest pain, palpitations,  claudication, edema  Respiratory: No cough, sputum production, hemoptysis, DOE Gastrointestinal: No heartburn, dysphagia, abdominal pain, nausea /vomiting, melena, change in bowels Musculoskeletal: No joint stiffness, joint swelling, pain Dermatologic: No rash, pruritus, change in appearance of skin Neurologic: No dizziness, headache, syncope, seizures, numbness, tingling Psychiatric: No significant anxiety, depression, insomnia, anorexia Endocrine: No change in hair/skin/nails, excessive thirst, excessive hunger  Hematologic/lymphatic: No significant bruising, lymphadenopathy, abnormal bleeding Allergy/immunology: No itchy/watery eyes, significant sneezing, urticaria, angioedema  Physical exam:  Pertinent or positive findings: The patient appears much younger than her stated age. Lacrimal glands are prominent. Pupils are pinpoint. The left pupil appears slightly off center. She is edentulous and not wearing her dentures. L pedal pulses are not palpable in the  left lower extremity BKA present on the right. She has isolated slight flexion contracture changes of fingers.  General appearance: Adequately nourished; no acute distress, increased work of breathing is present.  Lymphatic: No lymphadenopathy about the head, neck, axilla. Eyes: No conjunctival inflammation or lid edema is present. There is no scleral icterus. Ears:  External ear exam shows no significant lesions or deformities.   Nose:  External nasal examination shows no deformity or inflammation. Nasal mucosa are pink and moist without lesions, exudates Oral exam: Lips and gums are healthy appearing.There is no oropharyngeal erythema or exudate. Neck:  No thyromegaly, masses, tenderness noted.    Heart:  Normal rate and regular rhythm. S1 and S2 normal without gallop, murmur, click, rub.  Lungs: Chest clear to auscultation without wheezes, rhonchi, rales, rubs. Abdomen: Bowel sounds are normal.  Abdomen is soft and nontender with no organomegaly, hernias, masses. GU: Deferred  Extremities:  No cyanosis, clubbing,residual edema. Neurologic exam:  Strength equal  in upper extremities. Balance, Rhomberg, finger to nose testing could not be completed due to clinical state Skin: Warm & dry w/o tenting. No significant lesions or rash.  See clinical summary under each active problem in the Problem List with associated updated therapeutic plan

## 2017-11-16 NOTE — Assessment & Plan Note (Signed)
Continue T,Th,Sat dialysis schedule

## 2017-11-16 NOTE — Patient Instructions (Signed)
See assessment and plan under each diagnosis in the problem list and acutely for this visit 

## 2017-11-17 DIAGNOSIS — E1129 Type 2 diabetes mellitus with other diabetic kidney complication: Secondary | ICD-10-CM | POA: Diagnosis not present

## 2017-11-17 DIAGNOSIS — D509 Iron deficiency anemia, unspecified: Secondary | ICD-10-CM | POA: Diagnosis not present

## 2017-11-17 DIAGNOSIS — N186 End stage renal disease: Secondary | ICD-10-CM | POA: Diagnosis not present

## 2017-11-17 DIAGNOSIS — N2581 Secondary hyperparathyroidism of renal origin: Secondary | ICD-10-CM | POA: Diagnosis not present

## 2017-11-17 DIAGNOSIS — D631 Anemia in chronic kidney disease: Secondary | ICD-10-CM | POA: Diagnosis not present

## 2017-11-19 DIAGNOSIS — N186 End stage renal disease: Secondary | ICD-10-CM | POA: Diagnosis not present

## 2017-11-19 DIAGNOSIS — E1129 Type 2 diabetes mellitus with other diabetic kidney complication: Secondary | ICD-10-CM | POA: Diagnosis not present

## 2017-11-19 DIAGNOSIS — D631 Anemia in chronic kidney disease: Secondary | ICD-10-CM | POA: Diagnosis not present

## 2017-11-19 DIAGNOSIS — D509 Iron deficiency anemia, unspecified: Secondary | ICD-10-CM | POA: Diagnosis not present

## 2017-11-19 DIAGNOSIS — N2581 Secondary hyperparathyroidism of renal origin: Secondary | ICD-10-CM | POA: Diagnosis not present

## 2017-11-22 DIAGNOSIS — E1129 Type 2 diabetes mellitus with other diabetic kidney complication: Secondary | ICD-10-CM | POA: Diagnosis not present

## 2017-11-22 DIAGNOSIS — N2581 Secondary hyperparathyroidism of renal origin: Secondary | ICD-10-CM | POA: Diagnosis not present

## 2017-11-22 DIAGNOSIS — D509 Iron deficiency anemia, unspecified: Secondary | ICD-10-CM | POA: Diagnosis not present

## 2017-11-22 DIAGNOSIS — D631 Anemia in chronic kidney disease: Secondary | ICD-10-CM | POA: Diagnosis not present

## 2017-11-22 DIAGNOSIS — N186 End stage renal disease: Secondary | ICD-10-CM | POA: Diagnosis not present

## 2017-11-24 ENCOUNTER — Telehealth (INDEPENDENT_AMBULATORY_CARE_PROVIDER_SITE_OTHER): Payer: Self-pay

## 2017-11-24 DIAGNOSIS — D509 Iron deficiency anemia, unspecified: Secondary | ICD-10-CM | POA: Diagnosis not present

## 2017-11-24 DIAGNOSIS — N2581 Secondary hyperparathyroidism of renal origin: Secondary | ICD-10-CM | POA: Diagnosis not present

## 2017-11-24 DIAGNOSIS — D631 Anemia in chronic kidney disease: Secondary | ICD-10-CM | POA: Diagnosis not present

## 2017-11-24 DIAGNOSIS — N186 End stage renal disease: Secondary | ICD-10-CM | POA: Diagnosis not present

## 2017-11-24 DIAGNOSIS — E1129 Type 2 diabetes mellitus with other diabetic kidney complication: Secondary | ICD-10-CM | POA: Diagnosis not present

## 2017-11-24 NOTE — Telephone Encounter (Signed)
[name] called to advise that the pt's prevena vac was not working and that her shrinker was too tight causing a mark on her thigh. I advised that the vac should have been removed on Tuesday and that it is ok to d/c this now and apply a dry dressing with ac bandage and change this daily. She has an appt on Wednesday and asked them to have pt come with shrinker for eval. To call with any other questions.

## 2017-11-26 DIAGNOSIS — N2581 Secondary hyperparathyroidism of renal origin: Secondary | ICD-10-CM | POA: Diagnosis not present

## 2017-11-26 DIAGNOSIS — E1129 Type 2 diabetes mellitus with other diabetic kidney complication: Secondary | ICD-10-CM | POA: Diagnosis not present

## 2017-11-26 DIAGNOSIS — D631 Anemia in chronic kidney disease: Secondary | ICD-10-CM | POA: Diagnosis not present

## 2017-11-26 DIAGNOSIS — N186 End stage renal disease: Secondary | ICD-10-CM | POA: Diagnosis not present

## 2017-11-26 DIAGNOSIS — D509 Iron deficiency anemia, unspecified: Secondary | ICD-10-CM | POA: Diagnosis not present

## 2017-11-29 DIAGNOSIS — N186 End stage renal disease: Secondary | ICD-10-CM | POA: Diagnosis not present

## 2017-11-29 DIAGNOSIS — D509 Iron deficiency anemia, unspecified: Secondary | ICD-10-CM | POA: Diagnosis not present

## 2017-11-29 DIAGNOSIS — D631 Anemia in chronic kidney disease: Secondary | ICD-10-CM | POA: Diagnosis not present

## 2017-11-29 DIAGNOSIS — E1129 Type 2 diabetes mellitus with other diabetic kidney complication: Secondary | ICD-10-CM | POA: Diagnosis not present

## 2017-11-29 DIAGNOSIS — N2581 Secondary hyperparathyroidism of renal origin: Secondary | ICD-10-CM | POA: Diagnosis not present

## 2017-11-30 ENCOUNTER — Ambulatory Visit (INDEPENDENT_AMBULATORY_CARE_PROVIDER_SITE_OTHER): Payer: Medicare Other | Admitting: Family

## 2017-11-30 ENCOUNTER — Encounter (INDEPENDENT_AMBULATORY_CARE_PROVIDER_SITE_OTHER): Payer: Self-pay | Admitting: Family

## 2017-11-30 DIAGNOSIS — IMO0002 Reserved for concepts with insufficient information to code with codable children: Secondary | ICD-10-CM

## 2017-11-30 DIAGNOSIS — I7025 Atherosclerosis of native arteries of other extremities with ulceration: Secondary | ICD-10-CM

## 2017-11-30 DIAGNOSIS — Z89511 Acquired absence of right leg below knee: Secondary | ICD-10-CM

## 2017-11-30 NOTE — Progress Notes (Signed)
Post-Op Visit Note   Patient: [name]           Date of Birth: 07/20/1932           MRN: [id number] Visit Date: 11/30/2017 PCP: Mila PalmerWolters, Sharon, MD  Chief Complaint:  Chief Complaint  Patient presents with  . Right Knee - Follow-up    HPI:  HPI The patient is an 84 year old woman seen today 3 weeks status post right below knee amputation.  [facility]. Working with [employer] for prosthesis set up.  Ortho Exam Incision is healing well. No drainage, erythema or gaping.   Visit Diagnoses:  1. Atherosclerosis of native arteries of the extremities with ulceration (HCC)   2. Below knee amputation status, right (HCC)     Plan: staples harvested today. Given order for prosthesis set up. To call for appointment. Continue daily incisional cleansing and wear shrinker daily.  Follow-Up Instructions: Return in about 3 weeks (around 12/21/2017).   Imaging: No results found.  Orders:  No orders of the defined types were placed in this encounter.  No orders of the defined types were placed in this encounter.    PMFS History: Patient Active Problem List   Diagnosis Date Noted  . Unilateral complete BKA, right, initial encounter (HCC)   . Diabetes mellitus type 2 in obese (HCC)   . History of CVA (cerebrovascular accident)   . Post-operative pain   . Acute blood loss anemia   . Anemia of chronic disease   . Benign essential HTN   . Morbid obesity (HCC)   . Below knee amputation status, right (HCC) 11/11/2017  . Gangrene of right foot (HCC)   . PAD (peripheral artery disease) (HCC) 04/29/2015  . Atherosclerosis of native arteries of the extremities with ulceration (HCC) 01/10/2014  . CVA (cerebral infarction) 12/14/2013  . Dysphagia, unspecified(787.20) 11/28/2013  . Diabetes mellitus with peripheral vascular disease (HCC) 10/31/2012  . Depression 10/31/2012  . Anemia 10/31/2012  . History of Clostridium difficile colitis 10/31/2012  . Chronic diarrhea  10/31/2012  . ESRD on dialysis [facility](HCC) 10/06/2012   Past Medical History:  Diagnosis Date  . Anemia   . C. difficile diarrhea   . Chronic kidney disease    on Dialysis  . Depression   . Diabetes mellitus   . Diabetic neuropathy (HCC)   . Gangrene (HCC)    right foot  . GERD (gastroesophageal reflux disease)   . Gout   . Hyperparathyroidism   . MRSA (methicillin resistant staph aureus) culture positive   . Obesity   . Osteoarthritis   . Peripheral arterial disease (HCC)    Gangrene-Left Great Toe  . Shortness of breath dyspnea    with exertion  . Stroke [facility](HCC)     Family History  Problem Relation Age of Onset  . Cancer Father        type unknown  . Stroke Mother   . Diabetes Sister   . Cancer Brother        type unknown    Past Surgical History:  Procedure Laterality Date  . ABDOMINAL AORTOGRAM N/A 07/05/2016   Procedure: Abdominal Aortogram;  Surgeon: Maeola HarmanBrandon Christopher Cain, MD;  [facility];  Service: Cardiovascular;  Laterality: N/A;  . ABDOMINAL AORTOGRAM N/A 06/24/2017   Procedure: ABDOMINAL AORTOGRAM;  Surgeon: Sherren KernsFields, Charles E, MD;  [facility];  Service: Cardiovascular;  Laterality: N/A;  . ABDOMINAL HYSTERECTOMY    . AMPUTATION Right 11/11/2017  Procedure: RIGHT BELOW KNEE AMPUTATION;  Surgeon: Nadara Mustard, MD;  Location: [facility] OR;  Service: Orthopedics;  Laterality: Right;  . AV FISTULA PLACEMENT Left 07/07/2015   Procedure: ATTEMPTED INSERTION OFLEFT  ARTERIOVENOUS (AV) GORE-TEX GRAFT THIGH;  Surgeon: Fransisco Hertz, MD;  Location: [facility] OR;  Service: Vascular;  Laterality: Left;  . BELOW KNEE LEG AMPUTATION Right 11/11/2017  . BREAST EXCISIONAL BIOPSY Right 05/25/2006  . COLONOSCOPY    . LOWER EXTREMITY ANGIOGRAPHY Bilateral 07/05/2016   Procedure: Lower Extremity Angiography;  Surgeon: Maeola Harman, MD;  Location: [facility] INVAS[facility] CV LAB;  Service: Cardiovascular;  Laterality: Bilateral;  . LOWER EXTREMITY ANGIOGRAPHY  Bilateral 06/24/2017   Procedure: Lower Extremity Angiography;  Surgeon: Sherren Kerns, MD;  Location[facility] INVASIVE CV LAB;  Service: Cardiovascular;  Laterality: Bilateral;  . MULTIPLE TOOTH EXTRACTIONS    . PERIPHERAL VASCULAR INTERVENTION Right 06/24/2017   Procedure: PERIPHERAL VASCULAR INTERVENTION;  Surgeon: Sherren Kerns, MD;  Location: [facility] INVASIVE CV LAB;  Service: Cardiovascular;  Laterality: Right;  POPLITEALPTA SFA STENT  . SHUNTOGRAM Right 09/06/12   Thigh graft  . SP DECLOT AVGG Right Sep 14, 2012   Right thigh graft   Social History   Occupational History  . Occupation: retired    Associate Professor: RETIRED  Tobacco Use  . Smoking status: Former Smoker    Types: Cigarettes  . Smokeless tobacco: Never Used  Substance and Sexual Activity  . Alcohol use: No  . Drug use: No  . Sexual activity: Never

## 2017-12-01 DIAGNOSIS — N186 End stage renal disease: Secondary | ICD-10-CM | POA: Diagnosis not present

## 2017-12-01 DIAGNOSIS — E1129 Type 2 diabetes mellitus with other diabetic kidney complication: Secondary | ICD-10-CM | POA: Diagnosis not present

## 2017-12-01 DIAGNOSIS — D509 Iron deficiency anemia, unspecified: Secondary | ICD-10-CM | POA: Diagnosis not present

## 2017-12-01 DIAGNOSIS — N2581 Secondary hyperparathyroidism of renal origin: Secondary | ICD-10-CM | POA: Diagnosis not present

## 2017-12-01 DIAGNOSIS — D631 Anemia in chronic kidney disease: Secondary | ICD-10-CM | POA: Diagnosis not present

## 2017-12-01 NOTE — Progress Notes (Deleted)
Established Critical Limb Ischemia Patient   History of Present Illness   Jamie LippsBetty G [name] is a 84 y.o. (10/07/1932) female who presents with chief complaint: ***.    Prior procedures include: 1. R BKA by Dr. Lajoyce Cornersuda (11/11/17)  2. Partial R 1st ray partial amputation by Podiatry for gangrene by Dr. Ardelle AntonWagoner 3. PTA R pop, PTA+S R SFA (06/24/17) by Dr. Darrick PennaFields 4. Ao, BRo (07/05/16)  The patient has *** rest pain and wounds include: ***.  The patient notes symptoms have *** progressed.  The patient's treatment regimen currently included: maximal medical management and ***.  The patient's PMH, PSH, SH, and FamHx were reviewed on 12/07/17 are unchanged from 11/02/17.  Current Outpatient Medications  Medication Sig Dispense Refill  . clopidogrel (PLAVIX) 75 MG tablet Take 1 tablet (75 mg total) by mouth daily. 30 tablet 11  . gabapentin (NEURONTIN) 100 MG capsule Take 100 mg by mouth at bedtime.     Marland Kitchen. oxyCODONE-acetaminophen (PERCOCET/ROXICET) 5-325 MG tablet Take 1 tablet by mouth every 4 (four) hours as needed. 30 tablet 0  . RENVELA 800 MG tablet Take 1,600-2,400 mg by mouth See admin instructions. Take 2400 mg by mouth 3 times daily with meals and take 1600 mg by mouth with snacks    . SENSIPAR 30 MG tablet Take 30 mg by mouth every evening.      Current Facility-Administered Medications  Medication Dose Route Frequency Provider Last Rate Last Dose  . protamine injection 25 mg  25 mg Intravenous Once Sherren KernsFields, Charles E, MD        On ROS today: ***, ***   Physical Examination  ***There were no vitals filed for this visit. ***There is no height or weight on file to calculate BMI.  General {LOC:19197::"Somulent","Alert"}, {Orientation:19197::"Confused","O x 3"}, {Weight:19197::"Obese","Cachectic","WD"}, {General state of health:19197::"Ill appearing","Elderly","NAD"}  Pulmonary {Chest wall:19197::"Asx chest movement","Sym exp"}, {Air movt:19197::"Decreased *** air movt","good B air movt"},  {BS:19197::"rales on ***","rhonchi on ***","wheezing on ***","CTA B"}  Cardiac {Rhythm:19197::"Irregularly, irregular rate and rhythm","RRR, Nl S1, S2"}, {Murmur:19197::"Murmur present: ***","no Murmurs"}, {Rubs:19197::"Rub present: ***","No rubs"}, {Gallop:19197::"Gallop present: ***","No S3,S4"}  Vascular Vessel Right Left  Radial {Palpable:19197::"Not palpable","Faintly palpable","Palpable"} {Palpable:19197::"Not palpable","Faintly palpable","Palpable"}  Brachial {Palpable:19197::"Not palpable","Faintly palpable","Palpable"} {Palpable:19197::"Not palpable","Faintly palpable","Palpable"}  Carotid Palpable, {Bruit:19197::"Bruit present","No Bruit"} Palpable, {Bruit:19197::"Bruit present","No Bruit"}  Aorta Not palpable N/A  Femoral {Palpable:19197::"Not palpable","Faintly palpable","Palpable"} {Palpable:19197::"Not palpable","Faintly palpable","Palpable"}  Popliteal {Palpable:19197::"Prominently palpable","Not palpable"} {Palpable:19197::"Prominently palpable","Not palpable"}  PT {Palpable:19197::"Not palpable","Faintly palpable","Palpable"} {Palpable:19197::"Not palpable","Faintly palpable","Palpable"}  DP {Palpable:19197::"Not palpable","Faintly palpable","Palpable"} {Palpable:19197::"Not palpable","Faintly palpable","Palpable"}    Gastro- intestinal soft, {Distension:19197::"distended","non-distended"}, {TTP:19197::"TTP in *** quadrant","appropriate tenderness to palpation","non-tender to palpation"}, {Guarding:19197::"Guarding and rebound in *** quadrant","No guarding or rebound"}, {HSM:19197::"HSM present","no HSM"}, {Masses:19197::"Mass present: ***","no masses"}, {Flank:19197::"CVAT on ***","Flank bruit present on ***","no CVAT B"}, {AAA:19197::"Palpable prominent aortic pulse","No palpable prominent aortic pulse"}, {Surgical incision:19197::"Surg incisions: ***","Surgical incisions well healed"," "}  Musculo- skeletal M/S 5/5 throughout {MS:19197::"except ***"," "}, Extremities without  ischemic changes {MS:19197::"except ***"," "}, {Edema:19197::"Pitting edema present: ***","Non-pitting edema present: ***","No edema present"}, {Varicosities:19197::"Varicosities present: ***","No visible varicosities "}, {LDS:19197::"Lipodermatosclerosis present: ***","No Lipodermatosclerosis present"}  Neurologic Cranial nerves 2-12 intact{CN:19197::" except ***"," "}, Pain and light touch intact in extremities{CN:19197::" except for decreased sensation in ***"," "}, Motor exam as listed above    Non-Invasive Vascular Imaging   ABI (***)  R:   ABI: *** (***),   PT: {Signals:19197::"none","mono","bi","tri"}  DP: {Signals:19197::"none","mono","bi","tri"}  TBI:  ***  L:   ABI: *** (***),   PT: {Signals:19197::"none","mono","bi","tri"}  DP: {Signals:19197::"none","mono","bi","tri"}  TBI: ***   Medical Decision Making  BAILLIE MOHAMMAD is a 84 y.o. female who presents with: s/p R BKA, s/p R SFA/pop stenosis for CLI, known mod to severe LLE PAD   Based on the patient's vascular studies and examination, I have offered the patient: ***.  I discussed in depth with the patient the nature of atherosclerosis, and emphasized the importance of maximal medical management including strict control of blood pressure, blood glucose, and lipid levels, antiplatelet agents, obtaining regular exercise, and cessation of smoking.    The patient is aware that without maximal medical management the underlying atherosclerotic disease process will progress, limiting the benefit of any interventions.  The patient is currently not on on statin as not medically indicated.   The patient is currently on an anti-platelet: ASA.  Thank you for allowing [place] to participate in this patient's care.   Leonides Sake, MD, FACS [facility] Office: [phone] Pager: [phone]

## 2017-12-03 DIAGNOSIS — N186 End stage renal disease: Secondary | ICD-10-CM | POA: Diagnosis not present

## 2017-12-03 DIAGNOSIS — D631 Anemia in chronic kidney disease: Secondary | ICD-10-CM | POA: Diagnosis not present

## 2017-12-03 DIAGNOSIS — D509 Iron deficiency anemia, unspecified: Secondary | ICD-10-CM | POA: Diagnosis not present

## 2017-12-03 DIAGNOSIS — N2581 Secondary hyperparathyroidism of renal origin: Secondary | ICD-10-CM | POA: Diagnosis not present

## 2017-12-03 DIAGNOSIS — E1129 Type 2 diabetes mellitus with other diabetic kidney complication: Secondary | ICD-10-CM | POA: Diagnosis not present

## 2017-12-05 DIAGNOSIS — S71101A Unspecified open wound, right thigh, initial encounter: Secondary | ICD-10-CM | POA: Diagnosis not present

## 2017-12-05 DIAGNOSIS — L89622 Pressure ulcer of left heel, stage 2: Secondary | ICD-10-CM | POA: Diagnosis not present

## 2017-12-05 DIAGNOSIS — S31829A Unspecified open wound of left buttock, initial encounter: Secondary | ICD-10-CM | POA: Diagnosis not present

## 2017-12-05 DIAGNOSIS — S31819A Unspecified open wound of right buttock, initial encounter: Secondary | ICD-10-CM | POA: Diagnosis not present

## 2017-12-06 DIAGNOSIS — E1129 Type 2 diabetes mellitus with other diabetic kidney complication: Secondary | ICD-10-CM | POA: Diagnosis not present

## 2017-12-06 DIAGNOSIS — N2581 Secondary hyperparathyroidism of renal origin: Secondary | ICD-10-CM | POA: Diagnosis not present

## 2017-12-06 DIAGNOSIS — D509 Iron deficiency anemia, unspecified: Secondary | ICD-10-CM | POA: Diagnosis not present

## 2017-12-06 DIAGNOSIS — N186 End stage renal disease: Secondary | ICD-10-CM | POA: Diagnosis not present

## 2017-12-06 DIAGNOSIS — D631 Anemia in chronic kidney disease: Secondary | ICD-10-CM | POA: Diagnosis not present

## 2017-12-07 ENCOUNTER — Ambulatory Visit: Payer: Medicare Other | Admitting: Vascular Surgery

## 2017-12-08 DIAGNOSIS — Z992 Dependence on renal dialysis: Secondary | ICD-10-CM | POA: Diagnosis not present

## 2017-12-08 DIAGNOSIS — D631 Anemia in chronic kidney disease: Secondary | ICD-10-CM | POA: Diagnosis not present

## 2017-12-08 DIAGNOSIS — N186 End stage renal disease: Secondary | ICD-10-CM | POA: Diagnosis not present

## 2017-12-08 DIAGNOSIS — E1129 Type 2 diabetes mellitus with other diabetic kidney complication: Secondary | ICD-10-CM | POA: Diagnosis not present

## 2017-12-08 DIAGNOSIS — N2581 Secondary hyperparathyroidism of renal origin: Secondary | ICD-10-CM | POA: Diagnosis not present

## 2017-12-10 DIAGNOSIS — N186 End stage renal disease: Secondary | ICD-10-CM | POA: Diagnosis not present

## 2017-12-10 DIAGNOSIS — E1129 Type 2 diabetes mellitus with other diabetic kidney complication: Secondary | ICD-10-CM | POA: Diagnosis not present

## 2017-12-10 DIAGNOSIS — D631 Anemia in chronic kidney disease: Secondary | ICD-10-CM | POA: Diagnosis not present

## 2017-12-10 DIAGNOSIS — N2581 Secondary hyperparathyroidism of renal origin: Secondary | ICD-10-CM | POA: Diagnosis not present

## 2017-12-12 ENCOUNTER — Encounter: Payer: Self-pay | Admitting: Adult Health

## 2017-12-12 ENCOUNTER — Telehealth: Payer: Self-pay | Admitting: Podiatry

## 2017-12-12 ENCOUNTER — Ambulatory Visit (INDEPENDENT_AMBULATORY_CARE_PROVIDER_SITE_OTHER): Payer: Medicare Other | Admitting: Podiatry

## 2017-12-12 DIAGNOSIS — I7025 Atherosclerosis of native arteries of other extremities with ulceration: Secondary | ICD-10-CM

## 2017-12-12 DIAGNOSIS — L97401 Non-pressure chronic ulcer of unspecified heel and midfoot limited to breakdown of skin: Secondary | ICD-10-CM

## 2017-12-12 MED ORDER — CLINDAMYCIN HCL 300 MG PO CAPS: 300.0000 mg | ORAL_CAPSULE | Freq: Three times a day (TID) | ORAL | 0 refills | Status: DC

## 2017-12-12 NOTE — Telephone Encounter (Signed)
I informed pt's son, Channing MuttersRoy of Dr. Gabriel RungWagoner's orders and explained that the drainage mixing with the betadine wound make the dressing become wet. Channing MuttersRoy states understanding.

## 2017-12-12 NOTE — Telephone Encounter (Signed)
I am doing betadine wet to dry dressing on the wound daily- can be changed 2 times per day if needed.

## 2017-12-12 NOTE — Progress Notes (Deleted)
Location:  [facility] Room Number: 220-A Place of Service:  SNF (31) Provider:  Kenard GowerMedina-Vargas, Monina, NP  Patient Care Team: Mila PalmerWolters, Sharon, MD as PCP - General (Family Medicine) Vivi BarrackWagoner, Matthew R, DPM as Consulting Physician (Podiatry) Terrial Rhodesoladonato, Joseph, MD as Consulting Physician (Nephrology)  Extended Emergency Contact Information Primary Emergency Contact: Harris,Myrtle Address: [address]          [place], [place] Home Phone: [phone] Relation: Niece Secondary Emergency Contact: Mamie LaurelGuy,Roy  [place] Home Phone: [phone] Mobile Phone: [phone] Relation: Nephew  Code Status:  Full Code  Goals of care: Advanced Directive information Advanced Directives 11/14/2017  Does Patient Have a Medical Advance Directive? No  Type of Advance Directive -  Does patient want to make changes to medical advance directive? -  Copy of Healthcare Power of Attorney in Chart? -  Would patient like information on creating a medical advance directive? No - Patient declined  Pre-existing out of facility DNR order (yellow form or pink MOST form) -     Chief Complaint  Patient presents with  . Medical Management of Chronic Issues    The patient is seen for a routine [facility] visit    HPI:  Pt is an 84 y.o. female seen today for medical management of chronic diseases.  She is a short-term rehabilitation resident of [facility].  She has a PMH of PAD, diabetes, stroke, osteoarthritis, secondary hyperparathyroidism, history of gout, GERD, depression, and history of C. difficile diarrhea.   Past Medical History:  Diagnosis Date  . Anemia   . C. difficile diarrhea   . Chronic kidney disease    on Dialysis  . Depression   . Diabetes mellitus   . Diabetic neuropathy (HCC)   . Gangrene (HCC)    right foot  . GERD (gastroesophageal reflux disease)   . Gout   . Hyperparathyroidism   .  MRSA (methicillin resistant staph aureus) culture positive   . Obesity   . Osteoarthritis   . Peripheral arterial disease (HCC)    Gangrene-Left Great Toe  . Shortness of breath dyspnea    with exertion  . Stroke [facility](HCC)    Past Surgical History:  Procedure Laterality Date  . ABDOMINAL AORTOGRAM N/A 07/05/2016   Procedure: Abdominal Aortogram;  Surgeon: Maeola HarmanBrandon Christopher Cain, MD;  [facility];  Service: Cardiovascular;  Laterality: N/A;  . ABDOMINAL AORTOGRAM N/A 06/24/2017   Procedure: ABDOMINAL AORTOGRAM;  Surgeon: Sherren KernsFields, Charles E, MD;  [facility];  Service: Cardiovascular;  Laterality: N/A;  . ABDOMINAL HYSTERECTOMY    . AMPUTATION Right 11/11/2017   Procedure: RIGHT BELOW KNEE AMPUTATION;  Surgeon: Nadara Mustarduda, Marcus V, MD;  [facility];  Service: Orthopedics;  Laterality: Right;  . AV FISTULA PLACEMENT Left 07/07/2015   Procedure: ATTEMPTED INSERTION OFLEFT  ARTERIOVENOUS (AV) GORE-TEX GRAFT THIGH;  Surgeon: Fransisco HertzBrian L Chen, MD;  Location: [facility] OR;  Service: Vascular;  Laterality: Left;  . BELOW KNEE LEG AMPUTATION Right 11/11/2017  . BREAST EXCISIONAL BIOPSY Right 05/25/2006  . COLONOSCOPY    . LOWER EXTREMITY ANGIOGRAPHY Bilateral 07/05/2016   Procedure: Lower Extremity Angiography;  Surgeon: Maeola HarmanBrandon Christopher Cain, MD;  [facility];  Service: Cardiovascular;  Laterality: Bilateral;  . LOWER EXTREMITY ANGIOGRAPHY Bilateral 06/24/2017   Procedure: Lower Extremity Angiography;  Surgeon: Sherren KernsFields, Charles E, MD;  [facility];  Service: Cardiovascular;  Laterality: Bilateral;  . MULTIPLE TOOTH  EXTRACTIONS    . PERIPHERAL VASCULAR INTERVENTION Right 06/24/2017   Procedure: PERIPHERAL VASCULAR INTERVENTION;  Surgeon: Sherren Kerns, MD;  [facility]VAS[facility] CV LAB;  Service: Cardiovascular;  Laterality: Right;  POPLITEALPTA SFA STENT  . SHUNTOGRAM Right 09/06/12   Thigh graft  . SP DECLOT AVGG Right Sep 14, 2012   Right  thigh graft    Allergies  Allergen Reactions  . Codeine Hives  . Vancomycin Rash    Outpatient Encounter Medications as of 12/12/2017  Medication Sig  . Amino Acids-Protein Hydrolys (FEEDING SUPPLEMENT, PRO-STAT SUGAR FREE 64,) LIQD Take 30 mLs by mouth daily.  . clindamycin (CLEOCIN) 300 MG capsule Take 1 capsule (300 mg total) by mouth 3 (three) times daily.  . clopidogrel (PLAVIX) 75 MG tablet Take 1 tablet (75 mg total) by mouth daily.  Marland Kitchen gabapentin (NEURONTIN) 100 MG capsule Take 100 mg by mouth at bedtime.   . Nutritional Supplements (FEEDING SUPPLEMENT, NEPRO CARB STEADY,) LIQD Take 237 mLs by mouth daily.  Marland Kitchen oxyCODONE-acetaminophen (PERCOCET/ROXICET) 5-325 MG tablet Take 1 tablet by mouth every 4 (four) hours as needed.  Marland Kitchen RENVELA 800 MG tablet Take 1,600-2,400 mg by mouth See admin instructions. Take 2400 mg by mouth 3 times daily with meals and take 1600 mg by mouth with snacks  . SENSIPAR 30 MG tablet Take 30 mg by mouth every evening.    Facility-Administered Encounter Medications as of 12/12/2017  Medication  . protamine injection 25 mg    Review of Systems  GENERAL: No change in appetite, no fatigue, no weight changes, no fever, chills or weakness SKIN: Denies rash, itching, wounds, ulcer sores, or nail abnormalities EYES: Denies change in vision, dry eyes, eye pain, itching or discharge EARS: Denies change in hearing, ringing in ears, or earache NOSE: Denies nasal congestion or epistaxis MOUTH and THROAT: Denies oral discomfort, gingival pain or bleeding, pain from teeth or hoarseness   RESPIRATORY:  No cough, SOB, DOE, wheezing, hemoptysis CARDIAC: No chest pain, edema or palpitations GI: No abdominal pain, diarrhea, constipation, heart burn, nausea or vomiting GU: Denies dysuria, frequency, hematuria, incontinence, or discharge MUSCULOSKELETAL: Denies joint pain, muscle pain, back pain, restricted movement, or unusual weakness CIRCULATION: Denies claudication, edema  of legs, varicosities, or cold extremities NEUROLOGICAL: Denies dizziness, syncope, numbness, or headache PSYCHIATRIC: Denies feelings of depression or anxiety. No report of hallucinations, insomnia, paranoia, or agitation ENDOCRINE: Denies polyphagia, polyuria, polydipsia, heat or cold intolerance HEME/LYMPH: Denies excessive bruising, petechia, enlarged lymph nodes, or bleeding problems IMMUNOLOGIC: Denies history of frequent infections, AIDS, or use of immunosuppressive agents   Immunization History  Administered Date(s) Administered  . Influenza Split 04/03/2014   Pertinent  Health Maintenance Due  Topic Date Due  . FOOT EXAM  10/08/1942  . OPHTHALMOLOGY EXAM  10/08/1942  . URINE MICROALBUMIN  10/08/1942  . DEXA SCAN  10/09/1999  . PNA vac Low Risk Adult (1 of 2 - PCV13) 10/09/1999  . INFLUENZA VACCINE  12/08/2017  . HEMOGLOBIN A1C  05/14/2018   Fall Risk  07/11/2017 06/03/2014  Falls in the past year? No No      Vitals:   12/12/17 0918  BP: 134/62  Pulse: 72  Resp: 18  Temp: 97.6 F (36.4 C)  TempSrc: Oral  Weight: 170 lb (77.1 kg)  Height: 5\' 3"  (1.6 m)   Body mass index is 30.11 kg/m.  Physical Exam  GENERAL APPEARANCE: Well nourished. In no acute distress. Normal body habitus SKIN:  Skin is warm and dry.  There are no suspicious lesions or rash HEAD: Normal in size and contour. No evidence of trauma EYES: Lids open and close normally. No blepharitis, entropion or ectropion. PERRL. Conjunctivae are clear and sclerae are white. Lenses are without opacity EARS: Pinnae are normal. Patient hears normal voice tunes of the examiner MOUTH and THROAT: Lips are without lesions. Oral mucosa is moist and without lesions. Tongue is normal in shape, size, and color and without lesions NECK: supple, trachea midline, no neck masses, no thyroid tenderness, no thyromegaly LYMPHATICS: No LAN in the neck, no supraclavicular LAN RESPIRATORY: Breathing is even & unlabored, BS  CTAB CARDIAC: RRR, no murmur,no extra heart sounds, no edema GI: Abdomen soft, normal BS, no masses, no tenderness, no hepatomegaly, no splenomegaly MUSCULOSKELETAL: No deformities. Movement at each extremity is full and painless. Strength is 5/5 at each extremity. Back is without kyphosis or scoliosis CIRCULATION: Pedal pulses are 2+. There is no edema of the legs, ankles and feet NEUROLOGICAL: There is no tremor. Speech is clear PSYCHIATRIC: Alert and oriented X 3. Affect and behavior are appropriate  Labs reviewed: Recent Labs    11/11/17 0648 11/12/17 0836 11/15/17 0734  NA 139 138 135  K 3.6 3.8 4.2  CL 102 102 97*  CO2 27 26 27   GLUCOSE 116* 126* 117*  BUN 21 33* 42*  CREATININE 4.56* 6.57* 8.88*  CALCIUM 9.2 8.7* 7.9*  PHOS  --  5.9* 4.9*   Recent Labs    11/12/17 0836 11/15/17 0734  ALBUMIN 2.4* 2.2*   Recent Labs    11/11/17 0648 11/12/17 0837 11/15/17 0734  WBC 11.3* 12.4* 11.8*  HGB 9.4* 7.7* 7.6*  HCT 33.1* 27.1* 26.8*  MCV 76.6* 76.3* 75.3*  PLT 354 376 437*   Lab Results  Component Value Date   TSH 2.692 ***Test methodology is 3rd generation TSH*** 11/09/2006   Lab Results  Component Value Date   HGBA1C 5.7 (H) 11/11/2017   Lab Results  Component Value Date   CHOL 127 12/15/2013   HDL 35 (L) 12/15/2013   LDLCALC 44 12/15/2013   TRIG 241 (H) 12/15/2013   CHOLHDL 3.6 12/15/2013    Assessment/Plan    Family/ staff Communication:   Labs/tests ordered:    Goals of care:   Short-term rehabilitation.   Kenard Gower, NP [facility] [phone] (Monday-Friday 8:00 a.m. - 5:00 p.m.) [phone] (after hours)  This encounter was created in error - please disregard. This encounter was created in error - please disregard.

## 2017-12-12 NOTE — Telephone Encounter (Signed)
I'm calling on behalf of Jamie GardnerBetty [name]. She was seen by Dr. Ardelle AntonWagoner this morning for a place on her left heel. He wrapped it and everything and put medication on the pad or whatnot. Just a few minutes ago we noticed a lot of drainage coming through the padding and the other sock or whatnot. We just wanted to know what do we do? Do we have the people here at the rehab center change that out or do we need to schedule another appointment, and/or do we need to put another dry pad on her heel? If you can please give me a call back at  Number [phone]. Thank you.

## 2017-12-12 NOTE — Telephone Encounter (Addendum)
Left message instructing pt's son, Lucile CraterRoy Guy to pad with more gauze to absorb the drainage and I would call again with information from Dr. Ardelle AntonWagoner.

## 2017-12-13 ENCOUNTER — Telehealth: Payer: Self-pay | Admitting: Podiatry

## 2017-12-13 DIAGNOSIS — E1129 Type 2 diabetes mellitus with other diabetic kidney complication: Secondary | ICD-10-CM | POA: Diagnosis not present

## 2017-12-13 DIAGNOSIS — N186 End stage renal disease: Secondary | ICD-10-CM | POA: Diagnosis not present

## 2017-12-13 DIAGNOSIS — N2581 Secondary hyperparathyroidism of renal origin: Secondary | ICD-10-CM | POA: Diagnosis not present

## 2017-12-13 DIAGNOSIS — D631 Anemia in chronic kidney disease: Secondary | ICD-10-CM | POA: Diagnosis not present

## 2017-12-13 NOTE — Telephone Encounter (Signed)
No standing PT- want to take weight off of the heel

## 2017-12-13 NOTE — Telephone Encounter (Signed)
Rosey with PT called and needs clarification. Just wondering if pt can do sliding board transfer and non weight bearing PT. Can we do some type of PT without standing pt. Please call  Rosey back at [phone]. Please call ASAP, PT is at patients home.

## 2017-12-13 NOTE — Progress Notes (Signed)
This encounter was created in error - please disregard.

## 2017-12-14 ENCOUNTER — Ambulatory Visit (INDEPENDENT_AMBULATORY_CARE_PROVIDER_SITE_OTHER): Payer: Medicare Other | Admitting: Orthopedic Surgery

## 2017-12-14 NOTE — Telephone Encounter (Signed)
Misty StanleyStacey, PT [facility] request clarification of PT orders. I informed Misty StanleyStacey, PT that Dr. Ardelle AntonWagoner did not want weight bearing exercises due to left heel ulcer. Misty StanleyStacey, PT states pt needs hip and leg exercises, and I told her if they could be performed without pressure to the heels, or weight bearing.

## 2017-12-14 NOTE — Progress Notes (Signed)
Subjective: 84 year old female presents the office today for concerns of a large blister to the back of left heel.  The last saw her she is underwent a below-knee amputation of the right leg.  She is not sure had a blister on the left side started but she has noted some skin peeling off the area has been draining.  Denies any pain to the area at this time denies any increase in swelling. Denies any systemic complaints such as fevers, chills, nausea, vomiting. No acute changes since last appointment, and no other complaints at this time.   Objective: AAO x3, NAD DP, PT pulses decreased There is what appears to be a large blister to the posterior aspect the left heel with evidence of epidermal lysis on the area and macerated tissue and clear drainage expressed but there is no pus.  There is no fluctuation or crepitation there is no malodor.  There is no ascending cellulitis. No other open lesions. No pain with calf compression, swelling, warmth, erythema      Assessment: Left posterior heel wound  Plan: -All treatment options discussed with the patient including all alternatives, risks, complications.  -There is a large posterior heel wound encompassing the entire left heel.  I did debride the loose tissue today as it was hanging off.  The Betadine wet-to-dry dressing changes daily.  I dispensed offloading boot for her.  Prescribed clindamycin. Monitor for any clinical signs or symptoms of infection and directed to call the office immediately should any occur or go to the ER. -RTC 1 week or sooner if needed -Patient encouraged to call the office with any questions, concerns, change in symptoms.   *x-ray next appointment  Vivi BarrackMatthew R Wagoner DPM  Cc: Dr. Imogene Burnhen

## 2017-12-14 NOTE — Telephone Encounter (Signed)
Left message for Rosey, PT informing that Dr. Ardelle AntonWagoner did not want pt weight bearing.

## 2017-12-15 DIAGNOSIS — E1129 Type 2 diabetes mellitus with other diabetic kidney complication: Secondary | ICD-10-CM | POA: Diagnosis not present

## 2017-12-15 DIAGNOSIS — N186 End stage renal disease: Secondary | ICD-10-CM | POA: Diagnosis not present

## 2017-12-15 DIAGNOSIS — D631 Anemia in chronic kidney disease: Secondary | ICD-10-CM | POA: Diagnosis not present

## 2017-12-15 DIAGNOSIS — N2581 Secondary hyperparathyroidism of renal origin: Secondary | ICD-10-CM | POA: Diagnosis not present

## 2017-12-16 DIAGNOSIS — D631 Anemia in chronic kidney disease: Secondary | ICD-10-CM | POA: Diagnosis not present

## 2017-12-16 DIAGNOSIS — N2581 Secondary hyperparathyroidism of renal origin: Secondary | ICD-10-CM | POA: Diagnosis not present

## 2017-12-16 DIAGNOSIS — E1129 Type 2 diabetes mellitus with other diabetic kidney complication: Secondary | ICD-10-CM | POA: Diagnosis not present

## 2017-12-16 DIAGNOSIS — N186 End stage renal disease: Secondary | ICD-10-CM | POA: Diagnosis not present

## 2017-12-19 ENCOUNTER — Encounter: Payer: Self-pay | Admitting: Podiatry

## 2017-12-19 ENCOUNTER — Ambulatory Visit (INDEPENDENT_AMBULATORY_CARE_PROVIDER_SITE_OTHER): Payer: Medicare Other | Admitting: Podiatry

## 2017-12-19 ENCOUNTER — Ambulatory Visit (INDEPENDENT_AMBULATORY_CARE_PROVIDER_SITE_OTHER): Payer: Medicare Other

## 2017-12-19 DIAGNOSIS — M869 Osteomyelitis, unspecified: Secondary | ICD-10-CM

## 2017-12-19 DIAGNOSIS — I7025 Atherosclerosis of native arteries of other extremities with ulceration: Secondary | ICD-10-CM

## 2017-12-19 DIAGNOSIS — L97401 Non-pressure chronic ulcer of unspecified heel and midfoot limited to breakdown of skin: Secondary | ICD-10-CM

## 2017-12-19 NOTE — Patient Instructions (Signed)
Apply a small amount of betadine to the left heel wound daily and cover with 4x4 dressings and kerlix. Offload at all times.

## 2017-12-20 ENCOUNTER — Other Ambulatory Visit: Payer: Self-pay

## 2017-12-20 DIAGNOSIS — L97401 Non-pressure chronic ulcer of unspecified heel and midfoot limited to breakdown of skin: Secondary | ICD-10-CM

## 2017-12-20 DIAGNOSIS — E1129 Type 2 diabetes mellitus with other diabetic kidney complication: Secondary | ICD-10-CM | POA: Diagnosis not present

## 2017-12-20 DIAGNOSIS — D631 Anemia in chronic kidney disease: Secondary | ICD-10-CM | POA: Diagnosis not present

## 2017-12-20 DIAGNOSIS — N2581 Secondary hyperparathyroidism of renal origin: Secondary | ICD-10-CM | POA: Diagnosis not present

## 2017-12-20 DIAGNOSIS — I70201 Unspecified atherosclerosis of native arteries of extremities, right leg: Secondary | ICD-10-CM

## 2017-12-20 DIAGNOSIS — N186 End stage renal disease: Secondary | ICD-10-CM | POA: Diagnosis not present

## 2017-12-20 DIAGNOSIS — E1151 Type 2 diabetes mellitus with diabetic peripheral angiopathy without gangrene: Secondary | ICD-10-CM

## 2017-12-20 DIAGNOSIS — I739 Peripheral vascular disease, unspecified: Secondary | ICD-10-CM

## 2017-12-20 NOTE — Progress Notes (Signed)
Subjective: 84 year old female presents the office today with family members for follow-up evaluation of wound to the back of the left heel.  She lives in a rehab facility now and the family states they have been changing the bandages with Betadine but not on a regular basis with a recall.  Overall the patient states that she is doing well she does not have any significant pain to the area.  No increase in swelling of the noticed.  No redness to the foot. Denies any systemic complaints such as fevers, chills, nausea, vomiting. No acute changes since last appointment, and no other complaints at this time.   Objective: AAO x3, NAD DP/PT pulses decreased bilaterally On the left posterior heel with a large non-stagable eschar measuring approximate 5 x 4 cm and superficial.  There is no fluctuation or crepitation. The wound edges the wound are pink and healthy and is getting better.  There is no drainage or pus identified from the area today the drainage appears to be almost completely resolved.  There is no fluctuation or crepitation. No open lesions or pre-ulcerative lesions.  No pain with calf compression, swelling, warmth, erythema  Assessment: Left heel wound  Plan: -All treatment options discussed with the patient including all alternatives, risks, complications.  -Clinic continue with Betadine to dry dressing changes daily.  Will to keep a close eye on this.  Continue offloading at all times.  Hold off any further antibiotics when she finishes the course of the antibiotic she is on currently. I would like for her to follow-up with vascular surgery in regards to her circulation to the left leg. She recently had a BKA on the right.  -Monitor for any clinical signs or symptoms of infection and directed to call the office immediately should any occur or go to the ER. -Patient encouraged to call the office with any questions, concerns, change in symptoms.   Return in about 1 week (around  12/26/2017).  Vivi BarrackMatthew R Wagoner DPM

## 2017-12-21 ENCOUNTER — Ambulatory Visit (INDEPENDENT_AMBULATORY_CARE_PROVIDER_SITE_OTHER): Payer: Medicare Other | Admitting: Family

## 2017-12-21 DIAGNOSIS — Z89511 Acquired absence of right leg below knee: Secondary | ICD-10-CM | POA: Diagnosis not present

## 2017-12-21 DIAGNOSIS — IMO0002 Reserved for concepts with insufficient information to code with codable children: Secondary | ICD-10-CM

## 2017-12-22 DIAGNOSIS — D631 Anemia in chronic kidney disease: Secondary | ICD-10-CM | POA: Diagnosis not present

## 2017-12-22 DIAGNOSIS — N186 End stage renal disease: Secondary | ICD-10-CM | POA: Diagnosis not present

## 2017-12-22 DIAGNOSIS — N2581 Secondary hyperparathyroidism of renal origin: Secondary | ICD-10-CM | POA: Diagnosis not present

## 2017-12-22 DIAGNOSIS — E1129 Type 2 diabetes mellitus with other diabetic kidney complication: Secondary | ICD-10-CM | POA: Diagnosis not present

## 2017-12-23 ENCOUNTER — Telehealth (INDEPENDENT_AMBULATORY_CARE_PROVIDER_SITE_OTHER): Payer: Self-pay | Admitting: Orthopedic Surgery

## 2017-12-23 ENCOUNTER — Encounter (INDEPENDENT_AMBULATORY_CARE_PROVIDER_SITE_OTHER): Payer: Self-pay | Admitting: Family

## 2017-12-23 NOTE — Progress Notes (Signed)
Post-Op Visit Note   Patient: Jamie LippsBetty G Santistevan           Date of Birth: 05/27/1932           MRN: [id number] Visit Date: 12/21/2017 PCP: Mila PalmerWolters, Sharon, MD  Chief Complaint:  Chief Complaint  Patient presents with  . Right Leg - Routine Post Op    11/11/17 Right BKA    HPI:  HPI The patient is an 84 year old woman seen today status post right below knee amputation.   Residing at [facility]. Working with [employer] for prosthesis set up.  Ortho Exam Incision is healing well.  Scattered eschar centrally overall looks good. no drainage, erythema or gaping.  Does have a medial thigh ulcer states this is from the wound protector this is nearly healed there is an area that is 1 cm x 3 mm building with fibrinous tissue and Allevyn dressing was applied today.  Visit Diagnoses:  1. Below knee amputation status, right (HCC)     Plan: Continue daily incisional cleansing and wear shrinker daily.  Given from dressings for the medial thigh also these can be worn for a total of a week and reapplied after cleansing.  Dry dressings to her residual limb.  Did discuss that she can wear her stump shrinker with direct skin contact.  Follow-Up Instructions: Return in about 4 weeks (around 01/18/2018).   Imaging: No results found.  Orders:  No orders of the defined types were placed in this encounter.  No orders of the defined types were placed in this encounter.    PMFS History: Patient Active Problem List   Diagnosis Date Noted  . Unilateral complete BKA, right, initial encounter (HCC)   . Diabetes mellitus type 2 in obese (HCC)   . History of CVA (cerebrovascular accident)   . Post-operative pain   . Acute blood loss anemia   . Anemia of chronic disease   . Benign essential HTN   . Morbid obesity (HCC)   . Below knee amputation status, right (HCC) 11/11/2017  . Gangrene of right foot (HCC)   . PAD (peripheral artery disease) (HCC) 04/29/2015  . Atherosclerosis of native arteries of the  extremities with ulceration (HCC) 01/10/2014  . CVA (cerebral infarction) 12/14/2013  . Dysphagia, unspecified(787.20) 11/28/2013  . Diabetes mellitus with peripheral vascular disease (HCC) 10/31/2012  . Depression 10/31/2012  . Anemia 10/31/2012  . History of Clostridium difficile colitis 10/31/2012  . Chronic diarrhea 10/31/2012  . ESRD on dialysis [facility](HCC) 10/06/2012   Past Medical History:  Diagnosis Date  . Anemia   . C. difficile diarrhea   . Chronic kidney disease    on Dialysis  . Depression   . Diabetes mellitus   . Diabetic neuropathy (HCC)   . Gangrene (HCC)    right foot  . GERD (gastroesophageal reflux disease)   . Gout   . Hyperparathyroidism   . MRSA (methicillin resistant staph aureus) culture positive   . Obesity   . Osteoarthritis   . Peripheral arterial disease (HCC)    Gangrene-Left Great Toe  . Shortness of breath dyspnea    with exertion  . Stroke [facility](HCC)     Family History  Problem Relation Age of Onset  . Cancer Father        type unknown  . Stroke Mother   . Diabetes Sister   . Cancer Brother        type unknown    Past Surgical History:  Procedure Laterality Date  .  ABDOMINAL AORTOGRAM N/A 07/05/2016   Procedure: Abdominal Aortogram;  Surgeon: Maeola HarmanBrandon Christopher Cain, MD;  [facility];  Service: Cardiovascular;  Laterality: N/A;  . ABDOMINAL AORTOGRAM N/A 06/24/2017   Procedure: ABDOMINAL AORTOGRAM;  Surgeon: Sherren KernsFields, Charles E, MD;  [facility];  Service: Cardiovascular;  Laterality: N/A;  . ABDOMINAL HYSTERECTOMY    . AMPUTATION Right 11/11/2017   Procedure: RIGHT BELOW KNEE AMPUTATION;  Surgeon: Nadara Mustarduda, Marcus V, MD;  [facility];  Service: Orthopedics;  Laterality: Right;  . AV FISTULA PLACEMENT Left 07/07/2015   Procedure: ATTEMPTED INSERTION OFLEFT  ARTERIOVENOUS (AV) GORE-TEX GRAFT THIGH;  Surgeon: Fransisco HertzBrian L Chen, MD;  Location: [facility] OR;  Service: Vascular;  Laterality: Left;  . BELOW KNEE LEG AMPUTATION Right  11/11/2017  . BREAST EXCISIONAL BIOPSY Right 05/25/2006  . COLONOSCOPY    . LOWER EXTREMITY ANGIOGRAPHY Bilateral 07/05/2016   Procedure: Lower Extremity Angiography;  Surgeon: Maeola HarmanBrandon Christopher Cain, MD;  [facility];  Service: Cardiovascular;  Laterality: Bilateral;  . LOWER EXTREMITY ANGIOGRAPHY Bilateral 06/24/2017   Procedure: Lower Extremity Angiography;  Surgeon: Sherren KernsFields, Charles E, MD;  [facility];  Service: Cardiovascular;  Laterality: Bilateral;  . MULTIPLE TOOTH EXTRACTIONS    . PERIPHERAL VASCULAR INTERVENTION Right 06/24/2017   Procedure: PERIPHERAL VASCULAR INTERVENTION;  Surgeon: Sherren KernsFields, Charles E, MD;  [facility];  Service: Cardiovascular;  Laterality: Right;  POPLITEALPTA SFA STENT  . SHUNTOGRAM Right 09/06/12   Thigh graft  . SP DECLOT AVGG Right Sep 14, 2012   Right thigh graft   Social History   Occupational History  . Occupation: retired    Associate Professormployer: RETIRED  Tobacco Use  . Smoking status: Former Smoker    Types: Cigarettes  . Smokeless tobacco: Never Used  Substance and Sexual Activity  . Alcohol use: No  . Drug use: No  . Sexual activity: Never

## 2017-12-23 NOTE — Telephone Encounter (Signed)
Do you have a form on this pt? I do not see anything in his pending signature pile?

## 2017-12-23 NOTE — Telephone Encounter (Signed)
[name] from [facility] called wanting to know if you have received the LMN form yesterday that she faxed over to you.  CB#[phone].  Thank you.

## 2017-12-24 DIAGNOSIS — D631 Anemia in chronic kidney disease: Secondary | ICD-10-CM | POA: Diagnosis not present

## 2017-12-24 DIAGNOSIS — E1129 Type 2 diabetes mellitus with other diabetic kidney complication: Secondary | ICD-10-CM | POA: Diagnosis not present

## 2017-12-24 DIAGNOSIS — N2581 Secondary hyperparathyroidism of renal origin: Secondary | ICD-10-CM | POA: Diagnosis not present

## 2017-12-24 DIAGNOSIS — N186 End stage renal disease: Secondary | ICD-10-CM | POA: Diagnosis not present

## 2017-12-26 ENCOUNTER — Encounter: Payer: Self-pay | Admitting: Podiatry

## 2017-12-26 ENCOUNTER — Telehealth: Payer: Self-pay | Admitting: *Deleted

## 2017-12-26 ENCOUNTER — Ambulatory Visit (INDEPENDENT_AMBULATORY_CARE_PROVIDER_SITE_OTHER): Payer: Medicare Other | Admitting: Podiatry

## 2017-12-26 VITALS — Temp 97.3°F

## 2017-12-26 DIAGNOSIS — I7025 Atherosclerosis of native arteries of other extremities with ulceration: Secondary | ICD-10-CM | POA: Diagnosis not present

## 2017-12-26 DIAGNOSIS — L97401 Non-pressure chronic ulcer of unspecified heel and midfoot limited to breakdown of skin: Secondary | ICD-10-CM | POA: Diagnosis not present

## 2017-12-26 NOTE — Telephone Encounter (Signed)
Left message on Vernell BarrierRoy Guy's mobile phone informing of pt's new appt at [facility] 8:15am for 8:30am appt, and requested call back confirming.

## 2017-12-26 NOTE — Telephone Encounter (Signed)
Drinda ButtsAnnette - [facility] transferred me to scheduler - LaToya to get an earlier appt for pt to be evaluated for left heel eschar. Glee ArvinLaToya states she will call back with more appt information.

## 2017-12-26 NOTE — Telephone Encounter (Signed)
LaToya - [facility] states appt is available 12/27/2017 arrive 8:15am for 8:30am appt. Unable to leave a message with Glory RosebushMyrtle Harris concerning earlier appt [facility].

## 2017-12-26 NOTE — Patient Instructions (Addendum)
Continue with betadine wet to dry dressing to the left heel daily. Offload at all times.   We are going to try to get you an appointment with vascular surgery

## 2017-12-26 NOTE — Telephone Encounter (Signed)
received

## 2017-12-26 NOTE — Telephone Encounter (Signed)
Left message informing [name] Guy of pt's 12/27/2017 appt and to call to confirm or if I needed to reschedule.

## 2017-12-26 NOTE — Telephone Encounter (Signed)
The mobile phone number for pt is invalid.

## 2017-12-26 NOTE — Telephone Encounter (Signed)
Unable to leave message on Jamie BarrierRoy [name]'s home phone, voice mail box is not set up.

## 2017-12-27 ENCOUNTER — Encounter: Payer: Medicare Other | Admitting: Vascular Surgery

## 2017-12-27 ENCOUNTER — Encounter: Payer: Self-pay | Admitting: Vascular Surgery

## 2017-12-27 DIAGNOSIS — D631 Anemia in chronic kidney disease: Secondary | ICD-10-CM | POA: Diagnosis not present

## 2017-12-27 DIAGNOSIS — E1129 Type 2 diabetes mellitus with other diabetic kidney complication: Secondary | ICD-10-CM | POA: Diagnosis not present

## 2017-12-27 DIAGNOSIS — N186 End stage renal disease: Secondary | ICD-10-CM | POA: Diagnosis not present

## 2017-12-27 DIAGNOSIS — N2581 Secondary hyperparathyroidism of renal origin: Secondary | ICD-10-CM | POA: Diagnosis not present

## 2017-12-27 NOTE — Progress Notes (Signed)
Subjective: 84 year old female presents the office today with her nephew for follow-up evaluation of wound to the back of the left heel.  She is still living in the rehab facility.  Her nephew states that he is not sure how they are dressing the wound.  Patient denies any increase in pain or swelling or any drainage that she has noticed.  She has no other concerns. Denies any systemic complaints such as fevers, chills, nausea, vomiting. No acute changes since last appointment, and no other complaints at this time.   Objective: AAO x3, NAD DP/PT pulses decreased bilaterally On the left posterior heel with a large non-stagable eschar measuring approximate 5 x 4 cm and superficial.  Overall the area is about the same size however the edges are stating to loosen up but there is no fluctuation or crepitation is no drainage or pus.  There is no swelling erythema, ascending cellulitis.  No malodor. No open lesions or pre-ulcerative lesions.  No pain with calf compression, swelling, warmth, erythema  Assessment: Left heel wound  Plan: -All treatment options discussed with the patient including all alternatives, risks, complications.  -Cleda Clarksshar continues but started to loosen up.  Tinea Betadine dressing changes daily and offloading all times.  I tried to contact vascular to get her in for an appointment.  Continue elevation and offloading at all times and limit activity, weightbearing. -Patient encouraged to call the office with any questions, concerns, change in symptoms.   Return in about 1 week (around 01/02/2018).  Vivi BarrackMatthew R Wagoner DPM

## 2017-12-27 NOTE — Telephone Encounter (Signed)
Looks like she didn't make it today. Can we see if it can be rescheduled? Thanks.

## 2017-12-28 NOTE — Telephone Encounter (Signed)
Left message on pt's son's mobile phone that pt had been rescheduled for an appt 01/10/2018 arrive 9:15am for appt with Dr. Chestine Sporelark at 9:30am, and pt's 01/03/2018 appt had been cancelled. Unable to leave a message on pt's son's home phone the voicemail had not been set up.

## 2017-12-28 NOTE — Telephone Encounter (Signed)
[facility] - [name] states Dr. Chestine Sporelark is only in on Tuesdays and that does not work with pt's dialysis schedule of Mon., Wed., Fri., so next available is 02/17/2018 arrive 10:00am appt with Dr. Alvin Critchleyain10:15am, and pt is still on the cancellation schedule.

## 2017-12-28 NOTE — Telephone Encounter (Signed)
[name] - [facility] states next available appt is Tuesday 01/10/2018 arrive 9:15am for 9:30am with Dr. Chestine Sporelark, and pt is put on their cancellation schedule.

## 2017-12-28 NOTE — Telephone Encounter (Signed)
I informed Glory RosebushMyrtle Harris of pt's 02/17/2018 [facility] appt and that pt was on the cancellation schedule. I informed [facility] - Drinda Buttsnnette pt's family had accepted the 02/17/2018 appt.

## 2017-12-29 DIAGNOSIS — N2581 Secondary hyperparathyroidism of renal origin: Secondary | ICD-10-CM | POA: Diagnosis not present

## 2017-12-29 DIAGNOSIS — E1129 Type 2 diabetes mellitus with other diabetic kidney complication: Secondary | ICD-10-CM | POA: Diagnosis not present

## 2017-12-29 DIAGNOSIS — N186 End stage renal disease: Secondary | ICD-10-CM | POA: Diagnosis not present

## 2017-12-29 DIAGNOSIS — D631 Anemia in chronic kidney disease: Secondary | ICD-10-CM | POA: Diagnosis not present

## 2017-12-31 DIAGNOSIS — N2581 Secondary hyperparathyroidism of renal origin: Secondary | ICD-10-CM | POA: Diagnosis not present

## 2017-12-31 DIAGNOSIS — D631 Anemia in chronic kidney disease: Secondary | ICD-10-CM | POA: Diagnosis not present

## 2017-12-31 DIAGNOSIS — E1129 Type 2 diabetes mellitus with other diabetic kidney complication: Secondary | ICD-10-CM | POA: Diagnosis not present

## 2017-12-31 DIAGNOSIS — N186 End stage renal disease: Secondary | ICD-10-CM | POA: Diagnosis not present

## 2018-01-02 ENCOUNTER — Ambulatory Visit (INDEPENDENT_AMBULATORY_CARE_PROVIDER_SITE_OTHER): Payer: Medicare Other | Admitting: Physician Assistant

## 2018-01-02 ENCOUNTER — Encounter (INDEPENDENT_AMBULATORY_CARE_PROVIDER_SITE_OTHER): Payer: Self-pay | Admitting: Orthopedic Surgery

## 2018-01-02 ENCOUNTER — Encounter: Payer: Self-pay | Admitting: Podiatry

## 2018-01-02 ENCOUNTER — Telehealth: Payer: Self-pay | Admitting: *Deleted

## 2018-01-02 ENCOUNTER — Ambulatory Visit (INDEPENDENT_AMBULATORY_CARE_PROVIDER_SITE_OTHER): Payer: Medicare Other | Admitting: Podiatry

## 2018-01-02 VITALS — Ht 63.0 in | Wt 170.0 lb

## 2018-01-02 DIAGNOSIS — E1151 Type 2 diabetes mellitus with diabetic peripheral angiopathy without gangrene: Secondary | ICD-10-CM | POA: Diagnosis not present

## 2018-01-02 DIAGNOSIS — IMO0002 Reserved for concepts with insufficient information to code with codable children: Secondary | ICD-10-CM

## 2018-01-02 DIAGNOSIS — I7025 Atherosclerosis of native arteries of other extremities with ulceration: Secondary | ICD-10-CM | POA: Diagnosis not present

## 2018-01-02 DIAGNOSIS — I739 Peripheral vascular disease, unspecified: Secondary | ICD-10-CM

## 2018-01-02 DIAGNOSIS — L97401 Non-pressure chronic ulcer of unspecified heel and midfoot limited to breakdown of skin: Secondary | ICD-10-CM

## 2018-01-02 DIAGNOSIS — Z89511 Acquired absence of right leg below knee: Secondary | ICD-10-CM | POA: Diagnosis not present

## 2018-01-02 NOTE — Telephone Encounter (Signed)
-----   Message from Vivi BarrackMatthew R Wagoner, DPM sent at 01/02/2018  9:24 AM EDT ----- Can you order an arterial duplex on the left since we cannot get her into vascular.

## 2018-01-02 NOTE — Telephone Encounter (Signed)
Orders faxed to [facility]. 

## 2018-01-03 ENCOUNTER — Encounter (INDEPENDENT_AMBULATORY_CARE_PROVIDER_SITE_OTHER): Payer: Self-pay | Admitting: Physician Assistant

## 2018-01-03 ENCOUNTER — Encounter: Payer: Medicare Other | Admitting: Vascular Surgery

## 2018-01-03 DIAGNOSIS — N2581 Secondary hyperparathyroidism of renal origin: Secondary | ICD-10-CM | POA: Diagnosis not present

## 2018-01-03 DIAGNOSIS — D631 Anemia in chronic kidney disease: Secondary | ICD-10-CM | POA: Diagnosis not present

## 2018-01-03 DIAGNOSIS — E1129 Type 2 diabetes mellitus with other diabetic kidney complication: Secondary | ICD-10-CM | POA: Diagnosis not present

## 2018-01-03 DIAGNOSIS — N186 End stage renal disease: Secondary | ICD-10-CM | POA: Diagnosis not present

## 2018-01-03 NOTE — Progress Notes (Signed)
Office Visit Note   Patient: [name]           Date of Birth: 24-Jun-1932           MRN: [id number] Visit Date: 01/02/2018              Requested by: Mila Palmer, MD [address] [place], [place] [address] PCP: Mila Palmer, MD   Assessment & Plan: Visit Diagnoses:  1. Below knee amputation status, right (HCC)     Plan: She should discontinue the silicone liner and continue to wear the stump shrinker only.  They can apply Silvadene cream with gauze to cover the distal incision of the right transtibial amputation open areas and secured this with tape once daily following gentle cleansing of the area.  They should then apply the stump shrinker stocking on top of this dressing.  She will follow-up with Dr. Shella Spearing concerning treatment of her left foot.  She will follow-up with [facility] for her stump shrinkers and ultimately for prosthetic fitting.  Follow-Up Instructions: Return in about 4 weeks (around 01/30/2018).   Orders:  No orders of the defined types were placed in this encounter.  No orders of the defined types were placed in this encounter.     Procedures: No procedures performed   Clinical Data: No additional findings.   Subjective: Chief Complaint  Patient presents with  . Right Leg - Routine Post Op    11/14/17 BKA    HPI Patient is a 84 year old female who presents status post right BKA on 11/11/2017.  She has developed an open area over the distal right transtibial amputation and some drainage from the area.  She reports that this started when she started using the silicone liner.  There is no pain over this area and no redness.  [facility].  She also reports that she is due to see Dr. Shella Spearing this morning for treatment of her left foot. Review of Systems   Objective: Vital Signs: Ht 5\' 3"  (1.6 m)   Wt 170 lb (77.1 kg)   BMI 30.11 kg/m   Physical Exam Patient is an elderly  female who presents at the wheelchair level.  She is is alert and oriented and appropriate. Ortho Exam Examination of the right transtibial amputation shows the lateral incision to be slightly open.  There is no cellulitis.  Scant drainage no odor.   Specialty Comments:  No specialty comments available.  Imaging: No results found.   PMFS History: Patient Active Problem List   Diagnosis Date Noted  . Unilateral complete BKA, right, initial encounter (HCC)   . Diabetes mellitus type 2 in obese (HCC)   . History of CVA (cerebrovascular accident)   . Post-operative pain   . Acute blood loss anemia   . Anemia of chronic disease   . Benign essential HTN   . Morbid obesity (HCC)   . Below knee amputation status, right (HCC) 11/11/2017  . Gangrene of right foot (HCC)   . PAD (peripheral artery disease) (HCC) 04/29/2015  . Atherosclerosis of native arteries of the extremities with ulceration (HCC) 01/10/2014  . CVA (cerebral infarction) 12/14/2013  . Dysphagia, unspecified(787.20) 11/28/2013  . Diabetes mellitus with peripheral vascular disease (HCC) 10/31/2012  . Depression 10/31/2012  . Anemia 10/31/2012  . History of Clostridium difficile colitis 10/31/2012  . Chronic diarrhea 10/31/2012  . ESRD on dialysis [facility]) 10/06/2012   Past Medical History:  Diagnosis  Date  . Anemia   . C. difficile diarrhea   . Chronic kidney disease    on Dialysis  . Depression   . Diabetes mellitus   . Diabetic neuropathy (HCC)   . Gangrene (HCC)    right foot  . GERD (gastroesophageal reflux disease)   . Gout   . Hyperparathyroidism   . MRSA (methicillin resistant staph aureus) culture positive   . Obesity   . Osteoarthritis   . Peripheral arterial disease (HCC)    Gangrene-Left Great Toe  . Shortness of breath dyspnea    with exertion  . Stroke [facility](HCC)     Family History  Problem Relation Age of Onset  . Cancer Father        type unknown  . Stroke Mother   . Diabetes Sister   .  Cancer Brother        type unknown    Past Surgical History:  Procedure Laterality Date  . ABDOMINAL AORTOGRAM N/A 07/05/2016   Procedure: Abdominal Aortogram;  Surgeon: Maeola HarmanBrandon Christopher Cain, MD;  [facility];  Service: Cardiovascular;  Laterality: N/A;  . ABDOMINAL AORTOGRAM N/A 06/24/2017   Procedure: ABDOMINAL AORTOGRAM;  Surgeon: Sherren KernsFields, Charles E, MD;  [facility];  Service: Cardiovascular;  Laterality: N/A;  . ABDOMINAL HYSTERECTOMY    . AMPUTATION Right 11/11/2017   Procedure: RIGHT BELOW KNEE AMPUTATION;  Surgeon: Nadara Mustarduda, Marcus V, MD;  [facility];  Service: Orthopedics;  Laterality: Right;  . AV FISTULA PLACEMENT Left 07/07/2015   Procedure: ATTEMPTED INSERTION OFLEFT  ARTERIOVENOUS (AV) GORE-TEX GRAFT THIGH;  Surgeon: Fransisco HertzBrian L Chen, MD;  Location: [facility] OR;  Service: Vascular;  Laterality: Left;  . BELOW KNEE LEG AMPUTATION Right 11/11/2017  . BREAST EXCISIONAL BIOPSY Right 05/25/2006  . COLONOSCOPY    . LOWER EXTREMITY ANGIOGRAPHY Bilateral 07/05/2016   Procedure: Lower Extremity Angiography;  Surgeon: Maeola HarmanBrandon Christopher Cain, MD;  [facility];  Service: Cardiovascular;  Laterality: Bilateral;  . LOWER EXTREMITY ANGIOGRAPHY Bilateral 06/24/2017   Procedure: Lower Extremity Angiography;  Surgeon: Sherren KernsFields, Charles E, MD;  [facility];  Service: Cardiovascular;  Laterality: Bilateral;  . MULTIPLE TOOTH EXTRACTIONS    . PERIPHERAL VASCULAR INTERVENTION Right 06/24/2017   Procedure: PERIPHERAL VASCULAR INTERVENTION;  Surgeon: Sherren KernsFields, Charles E, MD;  [facility];  Service: Cardiovascular;  Laterality: Right;  POPLITEALPTA SFA STENT  . SHUNTOGRAM Right 09/06/12   Thigh graft  . SP DECLOT AVGG Right Sep 14, 2012   Right thigh graft   Social History   Occupational History  . Occupation: retired    Associate Professormployer: RETIRED  Tobacco Use  . Smoking status: Former Smoker    Types: Cigarettes  . Smokeless  tobacco: Never Used  Substance and Sexual Activity  . Alcohol use: No  . Drug use: No  . Sexual activity: Never

## 2018-01-03 NOTE — Progress Notes (Addendum)
Subjective: [age] year old female presents the office today with her nephew for follow-up evaluation of wound to the back of the left heel. [facility] and she states that they have been changing the bandages daily.  She denies any pain in the area and overall she has no concerns today. Denies any systemic complaints such as fevers, chills, nausea, vomiting. No acute changes since last appointment, and no other complaints at this time.   Objective: AAO x3, NAD DP/PT pulses decreased bilaterally On the left posterior heel with a large non-stagable eschar measuring about the same size as last appointment.  It is firmly adhered to the skin.  There is no surrounding erythema, ascending cellulitis.  There is no fluctuation or crepitation.  There is no drainage or pus identified today.  No other open lesions are identified.  No pain with calf compression, swelling, warmth, erythema      Assessment: Left heel eschar  Plan: -All treatment options discussed with the patient including all alternatives, risks, complications.  -The wound appears to be stable no signs of infection.  Continue Betadine wet-to-dry dressing changes for now.  Offloading at all times.  Limit activity on the heel.  She cannot make it to the vascular appointment last week because she had dialysis.  Unfortunately she cannot be seen until October.  I will order an arterial duplex today of the left leg  Vivi BarrackMatthew R Caylee Vlachos DPM

## 2018-01-05 DIAGNOSIS — N2581 Secondary hyperparathyroidism of renal origin: Secondary | ICD-10-CM | POA: Diagnosis not present

## 2018-01-05 DIAGNOSIS — E1129 Type 2 diabetes mellitus with other diabetic kidney complication: Secondary | ICD-10-CM | POA: Diagnosis not present

## 2018-01-05 DIAGNOSIS — N186 End stage renal disease: Secondary | ICD-10-CM | POA: Diagnosis not present

## 2018-01-05 DIAGNOSIS — D631 Anemia in chronic kidney disease: Secondary | ICD-10-CM | POA: Diagnosis not present

## 2018-01-06 ENCOUNTER — Encounter (HOSPITAL_COMMUNITY): Payer: Medicare Other

## 2018-01-06 ENCOUNTER — Inpatient Hospital Stay (HOSPITAL_COMMUNITY): Admission: RE | Admit: 2018-01-06 | Payer: Medicare Other | Source: Ambulatory Visit

## 2018-01-06 ENCOUNTER — Non-Acute Institutional Stay (SKILLED_NURSING_FACILITY): Payer: Medicare Other | Admitting: Adult Health

## 2018-01-06 ENCOUNTER — Encounter: Payer: Self-pay | Admitting: Adult Health

## 2018-01-06 DIAGNOSIS — Z992 Dependence on renal dialysis: Secondary | ICD-10-CM | POA: Diagnosis not present

## 2018-01-06 DIAGNOSIS — N186 End stage renal disease: Secondary | ICD-10-CM

## 2018-01-06 DIAGNOSIS — G629 Polyneuropathy, unspecified: Secondary | ICD-10-CM | POA: Diagnosis not present

## 2018-01-06 DIAGNOSIS — Z8673 Personal history of transient ischemic attack (TIA), and cerebral infarction without residual deficits: Secondary | ICD-10-CM

## 2018-01-06 DIAGNOSIS — L89629 Pressure ulcer of left heel, unspecified stage: Secondary | ICD-10-CM

## 2018-01-06 DIAGNOSIS — I96 Gangrene, not elsewhere classified: Secondary | ICD-10-CM | POA: Diagnosis not present

## 2018-01-06 DIAGNOSIS — D638 Anemia in other chronic diseases classified elsewhere: Secondary | ICD-10-CM

## 2018-01-06 NOTE — Progress Notes (Signed)
Location:  [facility] Room Number: 220-A Place of Service:  SNF (31) Provider:  Kenard Gower, NP  Patient Care Team: Mila Palmer, MD as PCP - General (Family Medicine) Vivi Barrack, DPM as Consulting Physician (Podiatry) Terrial Rhodes, MD as Consulting Physician (Nephrology)  Extended Emergency Contact Information Primary Emergency Contact: Harris,Myrtle Address: [address]          [place], [place] Home Phone: [phone] Relation: Niece Secondary Emergency Contact: Mamie La[place] Home Phone: [phone] Mobile Phone: [phone] Relation: Nephew  Code Status:  Full Code  Goals of care: Advanced Directive information Advanced Directives 11/14/2017  Does Patient Have a Medical Advance Directive? No  Type of Advance Directive -  Does patient want to make changes to medical advance directive? -  Copy of Healthcare Power of Attorney in Chart? -  Would patient like information on creating a medical advance directive? No - Patient declined  Pre-existing out of facility DNR order (yellow form or pink MOST form) -     Chief Complaint  Patient presents with  . Medical Management of Chronic Issues    The patient is seen for a routine [facility] visit    HPI:  Pt is an 84 y.o. female seen today for medical management of chronic diseases.  She is a short-term rehabilitation resident of [facility].  She has a PMH of PAD, DM, stroke, OA, secondary hyperparathyroidism, history of gout, GERD, depression, and history of C. difficile diarrhea. She was seen in the room with her niece and nephew. She has missed her appointment with Dr. Ardelle Anton due to transportation issues.  She has been admitted to [facility] on 11/15/17 from the hospital with gangrene of right foot for which she had right below the knee amputation on 11/11/17. She has been admitted  for a short-term rehabilitation.   Past Medical History:  Diagnosis Date  . Anemia   . C. difficile diarrhea   . Chronic kidney disease    on Dialysis  . Depression   . Diabetes mellitus   . Diabetic neuropathy (HCC)   . Gangrene (HCC)    right foot  . GERD (gastroesophageal reflux disease)   . Gout   . Hyperparathyroidism   . MRSA (methicillin resistant staph aureus) culture positive   . Obesity   . Osteoarthritis   . Peripheral arterial disease (HCC)    Gangrene-Left Great Toe  . Shortness of breath dyspnea    with exertion  . Stroke [facility])    Past Surgical History:  Procedure Laterality Date  . ABDOMINAL AORTOGRAM N/A 07/05/2016   Procedure: Abdominal Aortogram;  Surgeon: Maeola Harman, MD;  Location[facility]VASIVE CV LAB;  Service: Cardiovascular;  Laterality: N/A;  . ABDOMINAL AORTOGRAM N/A 06/24/2017   Procedure: ABDOMINAL AORTOGRAM;  Surgeon: Sherren Kerns, MD;  Location[facility] INVASIVE CV LAB;  Service: Cardiovascular;  Laterality: N/A;  . ABDOMINAL HYSTERECTOMY    . AMPUTATION Right 11/11/2017   Procedure: RIGHT BELOW KNEE AMPUTATION;  Surgeon: Nadara Mustard, MD;  Location: [facility] OR;  Service: Orthopedics;  Laterality: Right;  . AV FISTULA PLACEMENT Left 07/07/2015   Procedure: ATTEMPTED INSERTION OFLEFT  ARTERIOVENOUS (AV) GORE-TEX GRAFT THIGH;  Surgeon: Fransisco Hertz, MD;  Location: [facility] OR;  Service: Vascular;  Laterality: Left;  . BELOW KNEE LEG AMPUTATION Right 11/11/2017  . BREAST EXCISIONAL BIOPSY Right 05/25/2006  . COLONOSCOPY    . LOWER EXTREMITY  ANGIOGRAPHY Bilateral 07/05/2016   Procedure: Lower Extremity Angiography;  Surgeon: Maeola HarmanBrandon Christopher Cain, MD;  [facility];  Service: Cardiovascular;  Laterality: Bilateral;  . LOWER EXTREMITY ANGIOGRAPHY Bilateral 06/24/2017   Procedure: Lower Extremity Angiography;  Surgeon: Sherren KernsFields, Charles E, MD;  [facility];  Service: Cardiovascular;  Laterality: Bilateral;  . MULTIPLE  TOOTH EXTRACTIONS    . PERIPHERAL VASCULAR INTERVENTION Right 06/24/2017   Procedure: PERIPHERAL VASCULAR INTERVENTION;  Surgeon: Sherren KernsFields, Charles E, MD;  [facility];  Service: Cardiovascular;  Laterality: Right;  POPLITEALPTA SFA STENT  . SHUNTOGRAM Right 09/06/12   Thigh graft  . SP DECLOT AVGG Right Sep 14, 2012   Right thigh graft    Allergies  Allergen Reactions  . Codeine Hives  . Vancomycin Rash    Outpatient Encounter Medications as of 01/06/2018  Medication Sig  . Amino Acids-Protein Hydrolys (FEEDING SUPPLEMENT, PRO-STAT SUGAR FREE 64,) LIQD Take 30 mLs by mouth daily.   . clopidogrel (PLAVIX) 75 MG tablet Take 1 tablet (75 mg total) by mouth daily.  Marland Kitchen. gabapentin (NEURONTIN) 100 MG capsule Take 100 mg by mouth at bedtime.   . Nutritional Supplements (FEEDING SUPPLEMENT, NEPRO CARB STEADY,) LIQD Take 237 mLs by mouth daily.  Marland Kitchen. oxyCODONE-acetaminophen (PERCOCET/ROXICET) 5-325 MG tablet Take 1 tablet by mouth every 4 (four) hours as needed.  Marland Kitchen. RENVELA 800 MG tablet Take 1,600-2,400 mg by mouth See admin instructions. Take 2400 mg by mouth 3 times daily with meals and take 1600 mg by mouth with snacks  . SENSIPAR 30 MG tablet Take 30 mg by mouth every evening.   . [DISCONTINUED] clindamycin (CLEOCIN) 300 MG capsule Take 1 capsule (300 mg total) by mouth 3 (three) times daily.   Facility-Administered Encounter Medications as of 01/06/2018  Medication  . protamine injection 25 mg    Review of Systems  GENERAL: No change in appetite, no fatigue, no weight changes, no fever, chills or weakness MOUTH and THROAT: Denies oral discomfort, gingival pain or bleeding RESPIRATORY: no cough, SOB, DOE, wheezing, hemoptysis CARDIAC: No chest pain, edema or palpitations GI: No abdominal pain, diarrhea, constipation, heart burn, nausea or vomiting GU: Denies dysuria, frequency, hematuria, incontinence, or discharge PSYCHIATRIC: Denies feelings of depression or anxiety. No  report of hallucinations, insomnia, paranoia, or agitation   Immunization History  Administered Date(s) Administered  . Influenza Split 04/03/2014   Pertinent  Health Maintenance Due  Topic Date Due  . FOOT EXAM  10/08/1942  . OPHTHALMOLOGY EXAM  10/08/1942  . URINE MICROALBUMIN  10/08/1942  . DEXA SCAN  10/09/1999  . PNA vac Low Risk Adult (1 of 2 - PCV13) 10/09/1999  . INFLUENZA VACCINE  12/08/2017  . HEMOGLOBIN A1C  05/14/2018   Fall Risk  07/11/2017 06/03/2014  Falls in the past year? No No      Vitals:   01/06/18 1158  BP: 110/68  Pulse: 64  Resp: 18  Temp: 97.6 F (36.4 C)  TempSrc: Oral  SpO2: 99%  Weight: 169 lb 9.9 oz (76.9 kg)  Height: 5\' 3"  (1.6 m)   Body mass index is 30.05 kg/m.  Physical Exam  GENERAL APPEARANCE: Well nourished. In no acute distress. Obese SKIN:  Left heel with blister (covered with dressing), tender to touch MOUTH and THROAT: Lips are without lesions. Oral mucosa is moist and without lesions. Tongue is normal in shape, size, and color and without lesions RESPIRATORY: Breathing is even & unlabored, BS CTAB CARDIAC: RRR, no  murmur,no extra heart sounds, no edema, right chest with permcath GI: Abdomen soft, normal BS, no masses, no tenderness EXTREMITIES:  Able to move X 4 extremities, Right BKA stump with shrinker PSYCHIATRIC: Alert and oriented X 3. Affect and behavior are appropriate  Labs reviewed: Recent Labs    11/11/17 0648 11/12/17 0836 11/15/17 0734  NA 139 138 135  K 3.6 3.8 4.2  CL 102 102 97*  CO2 27 26 27   GLUCOSE 116* 126* 117*  BUN 21 33* 42*  CREATININE 4.56* 6.57* 8.88*  CALCIUM 9.2 8.7* 7.9*  PHOS  --  5.9* 4.9*   Recent Labs    11/12/17 0836 11/15/17 0734  ALBUMIN 2.4* 2.2*   Recent Labs    11/11/17 0648 11/12/17 0837 11/15/17 0734  WBC 11.3* 12.4* 11.8*  HGB 9.4* 7.7* 7.6*  HCT 33.1* 27.1* 26.8*  MCV 76.6* 76.3* 75.3*  PLT 354 376 437*   Lab Results  Component Value Date   TSH 2.692 Test  methodology is 3rd generation TSH 11/09/2006   Lab Results  Component Value Date   HGBA1C 5.7 (H) 11/11/2017   Lab Results  Component Value Date   CHOL 127 12/15/2013   HDL 35 (L) 12/15/2013   LDLCALC 44 12/15/2013   TRIG 241 (H) 12/15/2013   CHOLHDL 3.6 12/15/2013    Significant Diagnostic Results in last 30 days:  Dg Foot Complete Left  Result Date: 12/19/2017 Please see detailed radiograph report in office note.   Assessment/Plan  1. Gangrene (HCC) - S/P BKA on 11/11/17, continue percocet 5/325 mg 1 tab Q 4 hours PRN for pain, continue current treatment, PT and OT for therapeutic strengthening exercises, fall precautions, follow-up with orthopedics on 01/11/18 at 1 PM   2. Pressure injury of skin of left heel, unspecified injury stage - was supposed to be seen by Dr. Ardelle Anton, DPM, today but will be re-scheduled on 01/13/18,  continue current treatment, continue heel protectors, monitor for infection   3. ESRD on dialysis [facility]) -  continue hemodialysis every Tuesdays, Thursdays, and Saturdays, fluid restriction 1500 mL, Renvela 800 mg 2 tabs with snacks, Cinacacet 0 mg 1 tab daily evening, Sevelamer 00 mg    4. History of CVA (cerebrovascular accident) - stable, continue Plavix 75 mg 1 tab daily   5. Anemia of chronic disease - hgb 7.6, stable   6. Neuropathy - continue gabapentin 100 mg 1 capsule daily at bedtime    Family/ staff Communication: Discussed plan of care with resident.  Labs/tests ordered:  None  Goals of care:   Short-term rehabilitation.   Kenard Gower, NP [facility] [phone] (Monday-Friday 8:00 a.m. - 5:00 p.m.) [phone] (after hours)

## 2018-01-07 DIAGNOSIS — N186 End stage renal disease: Secondary | ICD-10-CM | POA: Diagnosis not present

## 2018-01-07 DIAGNOSIS — E1129 Type 2 diabetes mellitus with other diabetic kidney complication: Secondary | ICD-10-CM | POA: Diagnosis not present

## 2018-01-07 DIAGNOSIS — D631 Anemia in chronic kidney disease: Secondary | ICD-10-CM | POA: Diagnosis not present

## 2018-01-07 DIAGNOSIS — N2581 Secondary hyperparathyroidism of renal origin: Secondary | ICD-10-CM | POA: Diagnosis not present

## 2018-01-08 ENCOUNTER — Emergency Department (HOSPITAL_COMMUNITY): Payer: Medicare Other

## 2018-01-08 ENCOUNTER — Other Ambulatory Visit: Payer: Self-pay

## 2018-01-08 ENCOUNTER — Inpatient Hospital Stay (HOSPITAL_COMMUNITY)
Admission: EM | Admit: 2018-01-08 | Discharge: 2018-01-12 | DRG: 308 | Disposition: A | Discharge status: Skilled Nursing Facility | Payer: Medicare Other | Source: Skilled Nursing Facility | Attending: Internal Medicine | Admitting: Internal Medicine

## 2018-01-08 ENCOUNTER — Encounter (HOSPITAL_COMMUNITY): Payer: Self-pay | Admitting: Emergency Medicine

## 2018-01-08 DIAGNOSIS — E785 Hyperlipidemia, unspecified: Secondary | ICD-10-CM | POA: Diagnosis present

## 2018-01-08 DIAGNOSIS — Z95828 Presence of other vascular implants and grafts: Secondary | ICD-10-CM

## 2018-01-08 DIAGNOSIS — I361 Nonrheumatic tricuspid (valve) insufficiency: Secondary | ICD-10-CM | POA: Diagnosis not present

## 2018-01-08 DIAGNOSIS — Z992 Dependence on renal dialysis: Secondary | ICD-10-CM

## 2018-01-08 DIAGNOSIS — I120 Hypertensive chronic kidney disease with stage 5 chronic kidney disease or end stage renal disease: Secondary | ICD-10-CM | POA: Diagnosis present

## 2018-01-08 DIAGNOSIS — T8781 Dehiscence of amputation stump: Secondary | ICD-10-CM | POA: Diagnosis present

## 2018-01-08 DIAGNOSIS — L8990 Pressure ulcer of unspecified site, unspecified stage: Secondary | ICD-10-CM

## 2018-01-08 DIAGNOSIS — Y792 Prosthetic and other implants, materials and accessory orthopedic devices associated with adverse incidents: Secondary | ICD-10-CM | POA: Diagnosis present

## 2018-01-08 DIAGNOSIS — N186 End stage renal disease: Secondary | ICD-10-CM | POA: Diagnosis not present

## 2018-01-08 DIAGNOSIS — E8889 Other specified metabolic disorders: Secondary | ICD-10-CM | POA: Diagnosis present

## 2018-01-08 DIAGNOSIS — R5381 Other malaise: Secondary | ICD-10-CM | POA: Diagnosis not present

## 2018-01-08 DIAGNOSIS — I96 Gangrene, not elsewhere classified: Secondary | ICD-10-CM | POA: Diagnosis present

## 2018-01-08 DIAGNOSIS — Z833 Family history of diabetes mellitus: Secondary | ICD-10-CM

## 2018-01-08 DIAGNOSIS — I739 Peripheral vascular disease, unspecified: Secondary | ICD-10-CM | POA: Diagnosis not present

## 2018-01-08 DIAGNOSIS — Z823 Family history of stroke: Secondary | ICD-10-CM

## 2018-01-08 DIAGNOSIS — E669 Obesity, unspecified: Secondary | ICD-10-CM | POA: Diagnosis present

## 2018-01-08 DIAGNOSIS — L89620 Pressure ulcer of left heel, unstageable: Secondary | ICD-10-CM | POA: Diagnosis present

## 2018-01-08 DIAGNOSIS — D631 Anemia in chronic kidney disease: Secondary | ICD-10-CM | POA: Diagnosis not present

## 2018-01-08 DIAGNOSIS — Z885 Allergy status to narcotic agent status: Secondary | ICD-10-CM

## 2018-01-08 DIAGNOSIS — Z7401 Bed confinement status: Secondary | ICD-10-CM | POA: Diagnosis not present

## 2018-01-08 DIAGNOSIS — Z7902 Long term (current) use of antithrombotics/antiplatelets: Secondary | ICD-10-CM

## 2018-01-08 DIAGNOSIS — Z8673 Personal history of transient ischemic attack (TIA), and cerebral infarction without residual deficits: Secondary | ICD-10-CM | POA: Diagnosis not present

## 2018-01-08 DIAGNOSIS — G458 Other transient cerebral ischemic attacks and related syndromes: Secondary | ICD-10-CM | POA: Diagnosis present

## 2018-01-08 DIAGNOSIS — E1140 Type 2 diabetes mellitus with diabetic neuropathy, unspecified: Secondary | ICD-10-CM | POA: Diagnosis not present

## 2018-01-08 DIAGNOSIS — D509 Iron deficiency anemia, unspecified: Secondary | ICD-10-CM | POA: Diagnosis not present

## 2018-01-08 DIAGNOSIS — Z6831 Body mass index (BMI) 31.0-31.9, adult: Secondary | ICD-10-CM | POA: Diagnosis not present

## 2018-01-08 DIAGNOSIS — Z87891 Personal history of nicotine dependence: Secondary | ICD-10-CM | POA: Diagnosis not present

## 2018-01-08 DIAGNOSIS — I4891 Unspecified atrial fibrillation: Secondary | ICD-10-CM | POA: Diagnosis not present

## 2018-01-08 DIAGNOSIS — M4802 Spinal stenosis, cervical region: Secondary | ICD-10-CM | POA: Diagnosis not present

## 2018-01-08 DIAGNOSIS — D638 Anemia in other chronic diseases classified elsewhere: Secondary | ICD-10-CM | POA: Diagnosis not present

## 2018-01-08 DIAGNOSIS — Z89511 Acquired absence of right leg below knee: Secondary | ICD-10-CM

## 2018-01-08 DIAGNOSIS — E1129 Type 2 diabetes mellitus with other diabetic kidney complication: Secondary | ICD-10-CM | POA: Diagnosis not present

## 2018-01-08 DIAGNOSIS — I959 Hypotension, unspecified: Secondary | ICD-10-CM | POA: Diagnosis present

## 2018-01-08 DIAGNOSIS — E1151 Type 2 diabetes mellitus with diabetic peripheral angiopathy without gangrene: Secondary | ICD-10-CM | POA: Diagnosis present

## 2018-01-08 DIAGNOSIS — Z881 Allergy status to other antibiotic agents status: Secondary | ICD-10-CM

## 2018-01-08 DIAGNOSIS — R488 Other symbolic dysfunctions: Secondary | ICD-10-CM | POA: Diagnosis not present

## 2018-01-08 DIAGNOSIS — N2581 Secondary hyperparathyroidism of renal origin: Secondary | ICD-10-CM | POA: Diagnosis not present

## 2018-01-08 DIAGNOSIS — M2550 Pain in unspecified joint: Secondary | ICD-10-CM | POA: Diagnosis not present

## 2018-01-08 DIAGNOSIS — E119 Type 2 diabetes mellitus without complications: Secondary | ICD-10-CM | POA: Diagnosis not present

## 2018-01-08 DIAGNOSIS — R29810 Facial weakness: Secondary | ICD-10-CM | POA: Diagnosis not present

## 2018-01-08 DIAGNOSIS — I6523 Occlusion and stenosis of bilateral carotid arteries: Secondary | ICD-10-CM | POA: Diagnosis not present

## 2018-01-08 DIAGNOSIS — Z794 Long term (current) use of insulin: Secondary | ICD-10-CM | POA: Diagnosis not present

## 2018-01-08 DIAGNOSIS — R531 Weakness: Secondary | ICD-10-CM | POA: Diagnosis not present

## 2018-01-08 DIAGNOSIS — M6281 Muscle weakness (generalized): Secondary | ICD-10-CM | POA: Diagnosis not present

## 2018-01-08 DIAGNOSIS — E1122 Type 2 diabetes mellitus with diabetic chronic kidney disease: Secondary | ICD-10-CM | POA: Diagnosis not present

## 2018-01-08 DIAGNOSIS — G459 Transient cerebral ischemic attack, unspecified: Secondary | ICD-10-CM | POA: Diagnosis not present

## 2018-01-08 DIAGNOSIS — I953 Hypotension of hemodialysis: Secondary | ICD-10-CM | POA: Diagnosis not present

## 2018-01-08 DIAGNOSIS — I482 Chronic atrial fibrillation: Secondary | ICD-10-CM | POA: Diagnosis not present

## 2018-01-08 LAB — ETHANOL: Alcohol, Ethyl (B): <10 mg/dL (ref <10)

## 2018-01-08 LAB — CBC
HEMATOCRIT: 41.2 % (ref 36.0–46.0)
Hemoglobin: 11.2 g/dL — ABNORMAL LOW (ref 12.0–15.0)
MCH: 22.8 pg — ABNORMAL LOW (ref 26.0–34.0)
MCHC: 27.2 g/dL — ABNORMAL LOW (ref 30.0–36.0)
MCV: 83.9 fL (ref 78.0–100.0)
Platelets: 284 10*3/uL (ref 150–400)
RBC: 4.91 MIL/uL (ref 3.87–5.11)
RDW: 24.6 % — ABNORMAL HIGH (ref 11.5–15.5)
WBC: 10.2 10*3/uL (ref 4.0–10.5)

## 2018-01-08 LAB — DIFFERENTIAL
BASOS ABS: 0.1 10*3/uL (ref 0.0–0.1)
Basophils Relative: 1 %
EOS PCT: 2 %
Eosinophils Absolute: 0.2 10*3/uL (ref 0.0–0.7)
LYMPHS PCT: 14 %
Lymphs Abs: 1.4 10*3/uL (ref 0.7–4.0)
Monocytes Absolute: 0.5 10*3/uL (ref 0.1–1.0)
Monocytes Relative: 5 %
Neutro Abs: 8 10*3/uL — ABNORMAL HIGH (ref 1.7–7.7)
Neutrophils Relative %: 78 %

## 2018-01-08 LAB — GLUCOSE, CAPILLARY: Glucose-Capillary: 120 mg/dL — ABNORMAL HIGH (ref 70–99)

## 2018-01-08 LAB — COMPREHENSIVE METABOLIC PANEL
ALBUMIN: 3.1 g/dL — AB (ref 3.5–5.0)
ALT: 14 U/L (ref 0–44)
AST: 18 U/L (ref 15–41)
Alkaline Phosphatase: 110 U/L (ref 38–126)
Anion gap: 11 (ref 5–15)
BUN: 35 mg/dL — ABNORMAL HIGH (ref 8–23)
CO2: 26 mmol/L (ref 22–32)
CREATININE: 5.1 mg/dL — AB (ref 0.44–1.00)
Calcium: 9 mg/dL (ref 8.9–10.3)
Chloride: 102 mmol/L (ref 98–111)
GFR calc Af Amer: 8 mL/min — ABNORMAL LOW (ref >60)
GFR calc non Af Amer: 7 mL/min — ABNORMAL LOW (ref >60)
Glucose, Bld: 181 mg/dL — ABNORMAL HIGH (ref 70–99)
Potassium: 3.7 mmol/L (ref 3.5–5.1)
Sodium: 139 mmol/L (ref 135–145)
Total Bilirubin: 0.7 mg/dL (ref 0.3–1.2)
Total Protein: 7.2 g/dL (ref 6.5–8.1)

## 2018-01-08 LAB — I-STAT CHEM 8, ED
BUN: 36 mg/dL — AB (ref 8–23)
CALCIUM ION: 1.11 mmol/L — AB (ref 1.15–1.40)
CHLORIDE: 101 mmol/L (ref 98–111)
CREATININE: 5.3 mg/dL — AB (ref 0.44–1.00)
GLUCOSE: 185 mg/dL — AB (ref 70–99)
HCT: 42 % (ref 36.0–46.0)
Hemoglobin: 14.3 g/dL (ref 12.0–15.0)
Potassium: 3.5 mmol/L (ref 3.5–5.1)
Sodium: 140 mmol/L (ref 135–145)
TCO2: 26 mmol/L (ref 22–32)

## 2018-01-08 LAB — I-STAT TROPONIN, ED: Troponin i, poc: 0.03 ng/mL (ref 0.00–0.08)

## 2018-01-08 LAB — MRSA PCR SCREENING: MRSA by PCR: NEGATIVE

## 2018-01-08 LAB — HEPARIN LEVEL (UNFRACTIONATED): Heparin Unfractionated: 0.36 IU/mL (ref 0.30–0.70)

## 2018-01-08 LAB — PROTIME-INR
INR: 1.09
Prothrombin Time: 14 seconds (ref 11.4–15.2)

## 2018-01-08 LAB — TROPONIN I: Troponin I: <0.03 ng/mL (ref <0.03)

## 2018-01-08 LAB — APTT: aPTT: 33 seconds (ref 24–36)

## 2018-01-08 MED ORDER — HEPARIN BOLUS VIA INFUSION
3500.0000 [IU] | Freq: Once | INTRAVENOUS | Status: AC
  Administered 2018-01-08: 3500 [IU] via INTRAVENOUS
  Filled 2018-01-08: qty 3500

## 2018-01-08 MED ORDER — SEVELAMER CARBONATE 800 MG PO TABS
2400.0000 mg | ORAL_TABLET | Freq: Three times a day (TID) | ORAL | Status: DC
  Administered 2018-01-08 – 2018-01-12 (×11): 2400 mg via ORAL
  Filled 2018-01-08 (×14): qty 3

## 2018-01-08 MED ORDER — CINACALCET HCL 30 MG PO TABS
30.0000 mg | ORAL_TABLET | Freq: Every evening | ORAL | Status: DC
  Administered 2018-01-08 – 2018-01-11 (×4): 30 mg via ORAL
  Filled 2018-01-08 (×5): qty 1

## 2018-01-08 MED ORDER — GABAPENTIN 100 MG PO CAPS
100.0000 mg | ORAL_CAPSULE | Freq: Every day | ORAL | Status: DC
  Administered 2018-01-08 – 2018-01-11 (×4): 100 mg via ORAL
  Filled 2018-01-08 (×4): qty 1

## 2018-01-08 MED ORDER — METOPROLOL TARTRATE 5 MG/5ML IV SOLN
5.0000 mg | Freq: Once | INTRAVENOUS | Status: AC
  Administered 2018-01-08: 5 mg via INTRAVENOUS
  Filled 2018-01-08: qty 5

## 2018-01-08 MED ORDER — STROKE: EARLY STAGES OF RECOVERY BOOK: Freq: Once | Status: DC

## 2018-01-08 MED ORDER — METOPROLOL TARTRATE 12.5 MG HALF TABLET
12.5000 mg | ORAL_TABLET | Freq: Two times a day (BID) | ORAL | Status: DC
  Filled 2018-01-08: qty 1

## 2018-01-08 MED ORDER — INSULIN ASPART 100 UNIT/ML ~~LOC~~ SOLN
0.0000 [IU] | Freq: Three times a day (TID) | SUBCUTANEOUS | Status: DC
  Administered 2018-01-09 – 2018-01-11 (×3): 1 [IU] via SUBCUTANEOUS

## 2018-01-08 MED ORDER — HEPARIN (PORCINE) IN NACL 100-0.45 UNIT/ML-% IJ SOLN
1100.0000 [IU]/h | INTRAMUSCULAR | Status: DC
  Administered 2018-01-08: 1000 [IU]/h via INTRAVENOUS
  Administered 2018-01-09: 1100 [IU]/h via INTRAVENOUS
  Filled 2018-01-08 (×3): qty 250

## 2018-01-08 MED ORDER — RENA-VITE PO TABS
1.0000 | ORAL_TABLET | Freq: Every day | ORAL | Status: DC
  Administered 2018-01-08 – 2018-01-11 (×4): 1 via ORAL
  Filled 2018-01-08 (×4): qty 1

## 2018-01-08 MED ORDER — OXYCODONE-ACETAMINOPHEN 5-325 MG PO TABS
1.0000 | ORAL_TABLET | Freq: Four times a day (QID) | ORAL | Status: DC | PRN
  Administered 2018-01-09 – 2018-01-12 (×4): 1 via ORAL
  Filled 2018-01-08 (×3): qty 1

## 2018-01-08 MED ORDER — PRO-STAT SUGAR FREE PO LIQD
30.0000 mL | Freq: Every day | ORAL | Status: DC
  Administered 2018-01-08 – 2018-01-12 (×4): 30 mL via ORAL
  Filled 2018-01-08 (×4): qty 30

## 2018-01-08 MED ORDER — SODIUM CHLORIDE 0.9 % IV SOLN
INTRAVENOUS | Status: DC
  Administered 2018-01-08: 13:00:00 via INTRAVENOUS

## 2018-01-08 MED ORDER — SEVELAMER CARBONATE 800 MG PO TABS
1600.0000 mg | ORAL_TABLET | ORAL | Status: DC
  Administered 2018-01-08: 1600 mg via ORAL
  Filled 2018-01-08 (×2): qty 2

## 2018-01-08 MED ORDER — SODIUM CHLORIDE 0.9 % IV BOLUS
150.0000 mL | Freq: Once | INTRAVENOUS | Status: AC
  Administered 2018-01-08: 150 mL via INTRAVENOUS

## 2018-01-08 MED ORDER — CLOPIDOGREL BISULFATE 75 MG PO TABS
75.0000 mg | ORAL_TABLET | Freq: Every day | ORAL | Status: DC
  Administered 2018-01-08 – 2018-01-09 (×2): 75 mg via ORAL
  Filled 2018-01-08 (×2): qty 1

## 2018-01-08 MED ORDER — NEPRO/CARBSTEADY PO LIQD
237.0000 mL | Freq: Every day | ORAL | Status: DC
  Administered 2018-01-08 – 2018-01-11 (×4): 237 mL via ORAL
  Filled 2018-01-08 (×5): qty 237

## 2018-01-08 MED ORDER — SENNOSIDES-DOCUSATE SODIUM 8.6-50 MG PO TABS
1.0000 | ORAL_TABLET | Freq: Every evening | ORAL | Status: DC | PRN
  Administered 2018-01-10 – 2018-01-11 (×2): 1 via ORAL
  Filled 2018-01-08 (×2): qty 1

## 2018-01-08 NOTE — ED Notes (Signed)
Patient transported to CT 

## 2018-01-08 NOTE — Progress Notes (Signed)
Pt BP 90/28. MD notified, gave verbal order 150 ML bolus. Will continue to monitor

## 2018-01-08 NOTE — Consult Note (Addendum)
NEURO HOSPITALIST      Requesting Physician: Dr. Adela Lank    Chief Complaint: right side gaze with left sided weakness  History obtained from:  Chart/ patient  HPI:                                                                                                                                         [name] is an 84 y.o. female  With PMH significant for CVA 2015 ( right side residual deficits )  and per family 2008/2009 , DM, PAD right BKA. Presented tp [facility] ED for right gaze deviation and left side weakness.  Patient states that she was fine. She does not know why she is here. Family says that son was concerned. Son is not at bedside and is home resting for work [employer].  Per chart review: Son noticed a right side gaze deviation and left side weakness. They were having a conversation and her gaze was stuck to the right. She was able to continue talking but was not able to look to her left. This lasted about 45 minutes. Resolved on exam. She has been at a rehab facility recovering from her right BKA. Patient denies any HA, SOB,CP, vision problems. Denies any falls or any LOC.  Per family patient is on dialysis. Patient on Plavix 75 mg daily  ED course:  Patient found to be in a. Fib with rvr ( plan to start heparin) CT head: no acute abnormality MRI head: negative for acute infarct. BG: 181 BUN: 36 Creatinine: 5.30 BP: 100/57  Chart review of past stroke:  2008/2009 per family patient had a stroke with chronic right side weakness. D/t electronic conversion no records or images exist from this time.  12/14/2017  Acute 4.5 mm non-hemorrhagic infarct within the lateral left thalamus were posterior limb internal capsule Remote lacunar infarcts of the thalami bilaterally.  (residual right side chronic weakness and right hemi-sensory deficit Remote infarcts of the right cerebellum and brachium pontis.  Date last known well: Date:  01/08/2018 Time last known well: Time: 06:15 tPA Given: No: contraindicated; recent surgery Modified Rankin: Rankin Score=3  NIHSS:1 1a Level of Conscious:0 1b LOC Questions: 0 1c LOC Commands: 0 2 Best Gaze: 0 3 Visual: 0 4 Facial Palsy: 0 5a Motor Arm - left: 0 5b Motor Arm - Right: 0 6a Motor Leg - Left: 0 6b Motor Leg - Right: UN 7 Limb Ataxia: 0 8 Sensory: 0 9 Best Language: 0 10 Dysarthria:1 11 Extinct. and Inattention:0 TOTAL: 1      Past Medical History:  Diagnosis Date  . Anemia   . C.  difficile diarrhea   . Chronic kidney disease    on Dialysis  . Depression   . Diabetes mellitus   . Diabetic neuropathy (HCC)   . Gangrene (HCC)    right foot  . GERD (gastroesophageal reflux disease)   . Gout   . Hyperparathyroidism   . MRSA (methicillin resistant staph aureus) culture positive   . Obesity   . Osteoarthritis   . Peripheral arterial disease (HCC)    Gangrene-Left Great Toe  . Shortness of breath dyspnea    with exertion  . Stroke [facility])     Past Surgical History:  Procedure Laterality Date  . ABDOMINAL AORTOGRAM N/A 07/05/2016   Procedure: Abdominal Aortogram;  Surgeon: Maeola Harman, MD;  Location: [facility] INVASIVE CV LAB;  Service: Cardiovascular;  Laterality: N/A;  . ABDOMINAL AORTOGRAM N/A 06/24/2017   Procedure: ABDOMINAL AORTOGRAM;  Surgeon: Sherren Kerns, MD;  Location: [facility] INVASIVE CV LAB;  Service: Cardiovascular;  Laterality: N/A;  . ABDOMINAL HYSTERECTOMY    . AMPUTATION Right 11/11/2017   Procedure: RIGHT BELOW KNEE AMPUTATION;  Surgeon: Nadara Mustard, MD;  Location: [facility] OR;  Service: Orthopedics;  Laterality: Right;  . AV FISTULA PLACEMENT Left 07/07/2015   Procedure: ATTEMPTED INSERTION OFLEFT  ARTERIOVENOUS (AV) GORE-TEX GRAFT THIGH;  Surgeon: Fransisco Hertz, MD;  Location: [facility] OR;  Service: Vascular;  Laterality: Left;  . BELOW KNEE LEG AMPUTATION Right 11/11/2017  . BREAST EXCISIONAL BIOPSY Right 05/25/2006  . COLONOSCOPY    .  LOWER EXTREMITY ANGIOGRAPHY Bilateral 07/05/2016   Procedure: Lower Extremity Angiography;  Surgeon: Maeola Harman, MD;  Location: [facility] INVASIVE CV LAB;  Service: Cardiovascular;  Laterality: Bilateral;  . LOWER EXTREMITY ANGIOGRAPHY Bilateral 06/24/2017   Procedure: Lower Extremity Angiography;  Surgeon: Sherren Kerns, MD;  Location: [facility] INVASIVE CV LAB;  Service: Cardiovascular;  Laterality: Bilateral;  . MULTIPLE TOOTH EXTRACTIONS    . PERIPHERAL VASCULAR INTERVENTION Right 06/24/2017   Procedure: PERIPHERAL VASCULAR INTERVENTION;  Surgeon: Sherren Kerns, MD;  Location: [facility] INVASIVE CV LAB;  Service: Cardiovascular;  Laterality: Right;  POPLITEALPTA SFA STENT  . SHUNTOGRAM Right 09/06/12   Thigh graft  . SP DECLOT AVGG Right Sep 14, 2012   Right thigh graft    Family History  Problem Relation Age of Onset  . Cancer Father        type unknown  . Stroke Mother   . Diabetes Sister   . Cancer Brother        type unknown         Social History:  reports that she has quit smoking. Her smoking use included cigarettes. She has never used smokeless tobacco. She reports that she does not drink alcohol or use drugs.  Allergies:  Allergies  Allergen Reactions  . Codeine Hives  . Vancomycin Rash    Medications:  Current Facility-Administered Medications  Medication Dose Route Frequency Provider Last Rate Last Dose  . heparin ADULT infusion 100 units/mL (25000 units/238mL sodium chloride 0.45%)  1,000 Units/hr Intravenous Continuous Quenton Fetter, RPH      . heparin bolus via infusion 3,500 Units  3,500 Units Intravenous Once Quenton Fetter, RPH      . protamine injection 25 mg  25 mg Intravenous Once Sherren Kerns, MD       Current Outpatient Medications  Medication Sig Dispense Refill  . Amino Acids-Protein Hydrolys (FEEDING SUPPLEMENT, PRO-STAT  SUGAR FREE 64,) LIQD Take 30 mLs by mouth daily.     . clopidogrel (PLAVIX) 75 MG tablet Take 1 tablet (75 mg total) by mouth daily. 30 tablet 11  . gabapentin (NEURONTIN) 100 MG capsule Take 100 mg by mouth at bedtime.     . Nutritional Supplements (FEEDING SUPPLEMENT, NEPRO CARB STEADY,) LIQD Take 237 mLs by mouth daily.    Marland Kitchen oxyCODONE-acetaminophen (PERCOCET/ROXICET) 5-325 MG tablet Take 1 tablet by mouth every 4 (four) hours as needed. 30 tablet 0  . RENVELA 800 MG tablet Take 1,600-2,400 mg by mouth See admin instructions. Take 2400 mg by mouth 3 times daily with meals and take 1600 mg by mouth with snacks    . SENSIPAR 30 MG tablet Take 30 mg by mouth every evening.        ROS:                                                                                                                                       History obtained from the patient  General ROS: negative for - chills, fatigue, fever, night sweats, weight gain or weight loss  Ophthalmic ROS: negative for - blurry vision, double vision, eye pain or loss of vision Respiratory ROS: negative for - cough,  shortness of breath or wheezing Cardiovascular ROS: negative for - chest pain, dyspnea on exertion,  Musculoskeletal ROS: negative for - joint swelling or muscular weakness Neurological ROS: as noted in HPI   General Examination:                                                                                                      Blood pressure 101/89, pulse (!) 107, temperature 97.7 F (36.5 C), temperature source Oral, resp. rate 15, SpO2 96 %.  HEENT-  Normocephalic, no lesions, without obvious abnormality.  Normal external eye and conjunctiva.  Cardiovascular- S1-S2 audible, tachycardic Lungs-no rhonchi or wheezing noted,  no excessive working breathing.  Saturations within normal limits on RA Extremities- Warm, dry and intact Musculoskeletal-no joint tenderness, right BKA Skin-warm and dry, left foot with drsg per family  patient has a sore on  Bottom of left foot.   Neurological Examination Mental Status: Alert, oriented, name/age/year/month/place.  Speech fluent without evidence of aphasia. Slight dysarthria noted. Able to follow  commands without difficulty. Cranial Nerves: II:  Visual fields grossly normal,  III,IV, VI: ptosis not present, extra-ocular motions intact bilaterally, pupils equal, round, reactive to light and accommodation V,VII: smile symmetric, facial light touch sensation normal bilaterally VIII: hearing normal bilaterally IX,X: uvula rises symmetrically XI: bilateral shoulder shrug XII: midline tongue extension Motor: Right : Upper extremity   5/5    Left:     Upper extremity   5/5  Lower extremity   amputation    Lower extremity   5/5 Tone and bulk:normal tone throughout; no atrophy noted Sensory:  light touch intact throughout, bilaterally Deep Tendon Reflexes: 2+ and symmetric biceps  Plantars: UTA d/t amputation on right and thick dressing on left foot for pressure ulcer of left foot. Cerebellar: normal finger-to-nose, unable to perform HTS Gait: deferred   Lab Results: Basic Metabolic Panel: Recent Labs  Lab 01/08/18 0854 01/08/18 0901  NA 139 140  K 3.7 3.5  CL 102 101  CO2 26  --   GLUCOSE 181* 185*  BUN 35* 36*  CREATININE 5.10* 5.30*  CALCIUM 9.0  --     CBC: Recent Labs  Lab 01/08/18 0854 01/08/18 0901  WBC 10.2  --   NEUTROABS 8.0*  --   HGB 11.2* 14.3  HCT 41.2 42.0  MCV 83.9  --   PLT 284  --     Lipid Panel: No results for input(s): CHOL, TRIG, HDL, CHOLHDL, VLDL, LDLCALC in the last 168 hours.  CBG: No results for input(s): GLUCAP in the last 168 hours.  Imaging: Dg Chest 2 View  Result Date: 01/08/2018 CLINICAL DATA:  LEFT-sided weakness.  Concern for infarct EXAM: CHEST - 2 VIEW COMPARISON:  06/27/2017 FINDINGS: Large-bore central venous line again noted. Normal cardiac silhouette. Low lung volumes. No pulmonary edema. Degenerative  spurring of the spine. IMPRESSION: Low lung volumes.  No acute findings. Electronically Signed   By: Genevive Bi M.D.   On: 01/08/2018 09:44   Ct Head Wo Contrast  Result Date: 01/08/2018  IMPRESSION: No acute abnormality. Atrophy and extensive chronic microvascular ischemic change. Atherosclerosis. Electronically Signed   By: Drusilla Kanner M.D.   On: 01/08/2018 09:35   Mr Maxine Glenn Head Wo Contrast  Result Date: 01/08/2018 . IMPRESSION: 1. Negative for acute infarct. Generalized atrophy with extensive chronic ischemic changes 2. Cervical spondylosis. Extensive C1-2 arthropathy with prominent pannus formation posterior to the dens causing spinal stenosis. Question rheumatoid arthritis 3. Mild to moderate intracranial atherosclerotic disease. No large vessel occlusion 4. Suboptimal MRA neck due to motion and lack of contrast. No hemodynamically significant stenosis in the carotid or vertebral arteries as described above. Electronically Signed   By: Marlan Palau M.D.   On: 01/08/2018 12:58   Mr Maxine Glenn Neck Wo Contrast  Result Date: 01/08/2018 IMPRESSION: 1. Negative for acute infarct. Generalized atrophy with extensive chronic ischemic changes 2. Cervical spondylosis. Extensive C1-2 arthropathy with prominent pannus formation posterior to the dens causing spinal stenosis. Question rheumatoid arthritis 3. Mild to moderate intracranial atherosclerotic disease. No large vessel occlusion 4. Suboptimal MRA neck due to motion and lack of contrast. No hemodynamically  significant stenosis in the carotid or vertebral arteries as described above.  Mr Brain Wo Contrast  Result Date: 01/08/2018 IMPRESSION: 1. Negative for acute infarct. Generalized atrophy with extensive chronic ischemic changes 2. Cervical spondylosis. Extensive C1-2 arthropathy with prominent pannus formation posterior to the dens causing spinal stenosis. Question rheumatoid arthritis 3. Mild to moderate intracranial atherosclerotic disease. No  large vessel occlusion 4. Suboptimal MRA neck due to motion and lack of contrast. No hemodynamically significant stenosis in the carotid or vertebral arteries as described above.     Valentina Lucks, MSN, NP-C [facility] [phone]  01/08/2018, 11:08 AM   Attending physician note to follow with Assessment and plan .   NEUROHOSPITALIST ADDENDUM Performed a face to face diagnostic evaluation.   I have reviewed the contents of history and physical exam as documented by PA/ARNP/Resident and agree with above documentation.  I have discussed and formulated the above plan as documented. Edits to the note have been made as needed.     Assessment: 84 y.o. female  With PMH significant for CVA 2015 ( right side residual deficits )  and per family 2008/2009 , DM, PAD right BKA. Presented to [facility] ED for right gaze deviation and left side weakness. CT Head: negative for hemorrhage. Patient found to be in a. Fib with rvr.    Impression: Stroke Risk Factors - diabetes mellitus and age Etiology : further work-up needed   Recommendations: --MRI Brain ( ordered) --MRA of the head w/o and neck with contrast ( ordered in ED) --Echocardiogram -- Continue Plavix until MRI brain before starting AC -- High intensity Statin if LDL > 70 -- HgbA1c, fasting lipid panel -- PT consult, OT consult, Speech consult --Telemetry monitoring --Frequent neuro checks --Stroke swallow screen    --please page stroke NP  Or  PA  Or MD from 8am -4 pm  as this patient from this time will be  followed by the stroke.   You can look them up on [email or web address]  Password [email or web address]   Episode is concerning for TIA.   MRI brain is negative for acute infarct.  MRA head and neck limited due to motion, however no obvious occlusion noted. Given new onset atrial fibrillation would recommend starting anticoagulation.   Georgiana Spinner Cleotha Whalin MD [facility] [phone]   If 7pm to 7am, please call on call as listed on  AMION.

## 2018-01-08 NOTE — ED Triage Notes (Signed)
Per [facility] pt coming from [facility] for her right BKA. Pts son was visiting mother this am when noticed she was talking to the right of the patient. Pt has hx of TIA with left sided weakness. Pt A&0x4. Equal grip strength.

## 2018-01-08 NOTE — ED Provider Notes (Signed)
[facility] EMERGENCY DEPARTMENT Provider Note   CSN: [id number] Arrival date & time: 01/08/18  0815     History   Chief Complaint No chief complaint on file.   HPI [name] is a 84 y.o. female.  84 yo F with a chief complaint of right-sided gaze deviation and left-sided weakness.  This was noted by the family member earlier today.  They are having a conversation with her and she was able to continue talking with them but was not able to turn her eyes and look at him.  He felt that she was not moving the left side of her body.  This lasted for about 45 minutes.  Then resolved.  Per the family she is otherwise been doing well for the past couple days to a week.  She is at her rehab facility after having a BKA.  She had some wound dehiscence after applying a prosthesis too early.  She has a couple other chronic wounds that are also being managed.  They deny cough congestion fever denies vomiting or diarrhea denies dark stool or blood in the stool.  Denies head injury or loss consciousness.  The history is provided by the patient and a relative.  Illness  This is a new problem. The current episode started 1 to 2 hours ago. The problem occurs rarely. The problem has been resolved. Pertinent negatives include no chest pain, no headaches and no shortness of breath. Nothing aggravates the symptoms. Nothing relieves the symptoms. She has tried nothing for the symptoms. The treatment provided no relief.    Past Medical History:  Diagnosis Date  . Anemia   . C. difficile diarrhea   . Chronic kidney disease    on Dialysis  . Depression   . Diabetes mellitus   . Diabetic neuropathy (HCC)   . Gangrene (HCC)    right foot  . GERD (gastroesophageal reflux disease)   . Gout   . Hyperparathyroidism   . MRSA (methicillin resistant staph aureus) culture positive   . Obesity   . Osteoarthritis   . Peripheral arterial disease (HCC)    Gangrene-Left Great Toe  . Shortness  of breath dyspnea    with exertion  . Stroke [facility])     Patient Active Problem List   Diagnosis Date Noted  . TIA (transient ischemic attack) 01/08/2018  . Unilateral complete BKA, right, initial encounter (HCC)   . Diabetes mellitus type 2 in obese (HCC)   . History of CVA (cerebrovascular accident)   . Post-operative pain   . Acute blood loss anemia   . Anemia of chronic disease   . Benign essential HTN   . Morbid obesity (HCC)   . Below knee amputation status, right (HCC) 11/11/2017  . Gangrene of right foot (HCC)   . PAD (peripheral artery disease) (HCC) 04/29/2015  . Atherosclerosis of native arteries of the extremities with ulceration (HCC) 01/10/2014  . CVA (cerebral infarction) 12/14/2013  . Dysphagia, unspecified(787.20) 11/28/2013  . Diabetes mellitus with peripheral vascular disease (HCC) 10/31/2012  . Depression 10/31/2012  . Anemia 10/31/2012  . History of Clostridium difficile colitis 10/31/2012  . Chronic diarrhea 10/31/2012  . ESRD on dialysis [facility]) 10/06/2012    Past Surgical History:  Procedure Laterality Date  . ABDOMINAL AORTOGRAM N/A 07/05/2016   Procedure: Abdominal Aortogram;  Surgeon: Maeola Harman, MD;  Location: [facility] INVASIVE CV LAB;  Service: Cardiovascular;  Laterality: N/A;  . ABDOMINAL AORTOGRAM N/A 06/24/2017   Procedure: ABDOMINAL  AORTOGRAM;  Surgeon: Sherren Kerns, MD;  Location: [facility] INVASIVE CV LAB;  Service: Cardiovascular;  Laterality: N/A;  . ABDOMINAL HYSTERECTOMY    . AMPUTATION Right 11/11/2017   Procedure: RIGHT BELOW KNEE AMPUTATION;  Surgeon: Nadara Mustard, MD;  Location: [facility] OR;  Service: Orthopedics;  Laterality: Right;  . AV FISTULA PLACEMENT Left 07/07/2015   Procedure: ATTEMPTED INSERTION OFLEFT  ARTERIOVENOUS (AV) GORE-TEX GRAFT THIGH;  Surgeon: Fransisco Hertz, MD;  Location: [facility] OR;  Service: Vascular;  Laterality: Left;  . BELOW KNEE LEG AMPUTATION Right 11/11/2017  . BREAST EXCISIONAL BIOPSY Right 05/25/2006  .  COLONOSCOPY    . LOWER EXTREMITY ANGIOGRAPHY Bilateral 07/05/2016   Procedure: Lower Extremity Angiography;  Surgeon: Maeola Harman, MD;  Location: [facility] INVASIVE CV LAB;  Service: Cardiovascular;  Laterality: Bilateral;  . LOWER EXTREMITY ANGIOGRAPHY Bilateral 06/24/2017   Procedure: Lower Extremity Angiography;  Surgeon: Sherren Kerns, MD;  Location: [facility] INVASIVE CV LAB;  Service: Cardiovascular;  Laterality: Bilateral;  . MULTIPLE TOOTH EXTRACTIONS    . PERIPHERAL VASCULAR INTERVENTION Right 06/24/2017   Procedure: PERIPHERAL VASCULAR INTERVENTION;  Surgeon: Sherren Kerns, MD;  Location: [facility] INVASIVE CV LAB;  Service: Cardiovascular;  Laterality: Right;  POPLITEALPTA SFA STENT  . SHUNTOGRAM Right 09/06/12   Thigh graft  . SP DECLOT AVGG Right Sep 14, 2012   Right thigh graft     OB History   None      Home Medications    Prior to Admission medications   Medication Sig Start Date End Date Taking? Authorizing Provider  Amino Acids-Protein Hydrolys (FEEDING SUPPLEMENT, PRO-STAT SUGAR FREE 64,) LIQD Take 30 mLs by mouth daily.    Yes [provider]  clopidogrel (PLAVIX) 75 MG tablet Take 1 tablet (75 mg total) by mouth daily. 06/24/17  Yes Fields, [name], MD  gabapentin (NEURONTIN) 100 MG capsule Take 100 mg by mouth at bedtime.  10/10/13  Yes [provider]  Nutritional Supplements (FEEDING SUPPLEMENT, NEPRO CARB STEADY,) LIQD Take 237 mLs by mouth daily.   Yes [provider]  oxyCODONE-acetaminophen (PERCOCET/ROXICET) 5-325 MG tablet Take 1 tablet by mouth every 4 (four) hours as needed. 11/15/17  Yes Nadara Mustard, MD  RENVELA 800 MG tablet Take 1,600-2,400 mg by mouth See admin instructions. Take 2400 mg by mouth 3 times daily with meals and take 1600 mg by mouth with snacks 11/20/14  Yes [provider]  SENSIPAR 30 MG tablet Take 30 mg by mouth every evening.  04/13/13  Yes [provider]    Family History Family History    Problem Relation Age of Onset  . Cancer Father        type unknown  . Stroke Mother   . Diabetes Sister   . Cancer Brother        type unknown    Social History Social History   Tobacco Use  . Smoking status: Former Smoker    Types: Cigarettes  . Smokeless tobacco: Never Used  Substance Use Topics  . Alcohol use: No  . Drug use: No     Allergies   Codeine and Vancomycin   Review of Systems Review of Systems  Constitutional: Negative for chills and fever.  HENT: Negative for congestion and rhinorrhea.   Eyes: Negative for redness and visual disturbance.  Respiratory: Negative for shortness of breath and wheezing.   Cardiovascular: Negative for chest pain and palpitations.  Gastrointestinal: Negative for nausea and vomiting.  Genitourinary: Negative for  dysuria and urgency.  Musculoskeletal: Negative for arthralgias and myalgias.  Skin: Negative for pallor and wound.  Neurological: Positive for speech difficulty and weakness. Negative for dizziness and headaches.     Physical Exam Updated Vital Signs BP (!) 84/33   Pulse (!) 107   Temp 97.7 F (36.5 C) (Oral)   Resp 15   SpO2 96%   Physical Exam  Constitutional: She is oriented to person, place, and time. She appears well-developed and well-nourished. No distress.  HENT:  Head: Normocephalic and atraumatic.  Eyes: Pupils are equal, round, and reactive to light. EOM are normal.  Neck: Normal range of motion. Neck supple.  Cardiovascular: An irregularly irregular rhythm present. Tachycardia present. Exam reveals no gallop and no friction rub.  No murmur heard. Pulmonary/Chest: Effort normal. She has no wheezes. She has no rales.  Abdominal: Soft. She exhibits no distension and no mass. There is no tenderness. There is no guarding.  Musculoskeletal: She exhibits no edema or tenderness.  Neurological: She is alert and oriented to person, place, and time. She has normal strength. No cranial nerve deficit or  sensory deficit. Coordination normal. GCS eye subscore is 4. GCS verbal subscore is 5. GCS motor subscore is 6.  Gait deferred with R bka  Skin: Skin is warm and dry. She is not diaphoretic.     Psychiatric: She has a normal mood and affect. Her behavior is normal.  Nursing note and vitals reviewed.    ED Treatments / Results  Labs (all labs ordered are listed, but only abnormal results are displayed) Labs Reviewed  CBC - Abnormal; Notable for the following components:      Result Value   Hemoglobin 11.2 (*)    MCH 22.8 (*)    MCHC 27.2 (*)    RDW 24.6 (*)    All other components within normal limits  DIFFERENTIAL - Abnormal; Notable for the following components:   Neutro Abs 8.0 (*)    All other components within normal limits  COMPREHENSIVE METABOLIC PANEL - Abnormal; Notable for the following components:   Glucose, Bld 181 (*)    BUN 35 (*)    Creatinine, Ser 5.10 (*)    Albumin 3.1 (*)    GFR calc non Af Amer 7 (*)    GFR calc Af Amer 8 (*)    All other components within normal limits  I-STAT CHEM 8, ED - Abnormal; Notable for the following components:   BUN 36 (*)    Creatinine, Ser 5.30 (*)    Glucose, Bld 185 (*)    Calcium, Ion 1.11 (*)    All other components within normal limits  MRSA PCR SCREENING  ETHANOL  PROTIME-INR  APTT  RAPID URINE DRUG SCREEN, HOSP PERFORMED  URINALYSIS, ROUTINE W REFLEX MICROSCOPIC  TROPONIN I  URINALYSIS, COMPLETE (UACMP) WITH MICROSCOPIC  HEPARIN LEVEL (UNFRACTIONATED)  I-STAT TROPONIN, ED    EKG EKG Interpretation  Date/Time:  Sunday January 08 2018 08:39:56 EDT Ventricular Rate:  142 PR Interval:    QRS Duration: 87 QT Interval:  351 QTC Calculation: 540 R Axis:   -130 Text Interpretation:  Right and left arm electrode reversal, interpretation assumes no reversal atrial fibrillation with rvr Inferior infarct, old Anterolateral infarct, age indeterminate Otherwise no significant change Confirmed by Melene Plan [phone])  on 01/08/2018 8:54:42 AM Also confirmed by Melene Plan [phone]), editor Sheppard Evens ([id number])  on 01/08/2018 9:38:20 AM   Radiology Dg Chest 2 View  Result Date: 01/08/2018 CLINICAL DATA:  LEFT-sided weakness.  Concern for infarct EXAM: CHEST - 2 VIEW COMPARISON:  06/27/2017 FINDINGS: Large-bore central venous line again noted. Normal cardiac silhouette. Low lung volumes. No pulmonary edema. Degenerative spurring of the spine. IMPRESSION: Low lung volumes.  No acute findings. Electronically Signed   By: Genevive Bi M.D.   On: 01/08/2018 09:44   Ct Head Wo Contrast  Result Date: 01/08/2018 CLINICAL DATA:  Onset left-sided weakness this morning. EXAM: CT HEAD WITHOUT CONTRAST TECHNIQUE: Contiguous axial images were obtained from the base of the skull through the vertex without intravenous contrast. COMPARISON:  Head CT scan 07/08/2015. FINDINGS: Brain: No evidence of acute infarction, hemorrhage, hydrocephalus, extra-axial collection or mass lesion/mass effect. Atrophy and extensive chronic microvascular ischemic change are again seen. Asymmetric atrophy of the right cerebellum relative to the left is chronic and unchanged. Vascular: Atherosclerosis noted. Skull: Intact.  No focal lesion. Sinuses/Orbits: Status post bilateral lens extraction. Otherwise negative. Other: None. IMPRESSION: No acute abnormality. Atrophy and extensive chronic microvascular ischemic change. Atherosclerosis. Electronically Signed   By: Drusilla Kanner M.D.   On: 01/08/2018 09:35   Mr Maxine Glenn Head Wo Contrast  Result Date: 01/08/2018 CLINICAL DATA:  TIA.  Diabetes EXAM: MRI HEAD WITHOUT CONTRAST MRA HEAD WITHOUT CONTRAST MRA NECK WITHOUT CONTRAST TECHNIQUE: Multiplanar, multiecho pulse sequences of the brain and surrounding structures were obtained without intravenous contrast. Angiographic images of the Circle of Willis were obtained using MRA technique without intravenous contrast. Angiographic images of the neck were obtained  using MRA technique without intravenous contrast. Carotid stenosis measurements (when applicable) are obtained utilizing NASCET criteria, using the distal internal carotid diameter as the denominator. COMPARISON:  CT head 01/08/2018 FINDINGS: MRI HEAD FINDINGS Brain: Negative for acute infarct. Moderate atrophy without hydrocephalus. Moderate chronic microvascular ischemic change throughout the white matter extending into the pons bilaterally and thalamus bilaterally. Chronic infarcts in the right brachium pontis and right cerebellum. Negative for hemorrhage or mass. Vascular: Normal arterial flow voids Skull and upper cervical spine: Diffuse cervical spondylosis. Extensive arthropathy C1-2 with prominent pannus posterior to the dens causing moderate spinal stenosis and cord flattening on the right. Question rheumatoid arthritis versus CPPD or osteoarthritis. Sinuses/Orbits: Paranasal sinuses clear.  Bilateral cataract surgery Other: None MRA HEAD FINDINGS Both vertebral artery patent to the basilar. Basilar widely patent. Posterior cerebral arteries are patent bilaterally with mild atherosclerotic disease distally bilaterally. Internal carotid artery patent bilaterally with moderate stenosis in the cavernous carotid bilaterally right greater than left. Anterior and middle cerebral arteries patent bilaterally. Moderate stenosis M2 segments bilaterally. Anterior cerebral arteries patent bilaterally. Negative for aneurysm. MRA NECK FINDINGS Suboptimal image quality due to motion and lack of intravenous contrast. Antegrade flow in carotid and vertebral arteries bilaterally. Proximal vertebral arteries are not imaged on the study related to artifact. Mid and distal vertebral arteries widely patent Internal carotid artery patent bilaterally. No high-grade stenosis. Limited detail through the carotid bifurcation. IMPRESSION: 1. Negative for acute infarct. Generalized atrophy with extensive chronic ischemic changes 2.  Cervical spondylosis. Extensive C1-2 arthropathy with prominent pannus formation posterior to the dens causing spinal stenosis. Question rheumatoid arthritis 3. Mild to moderate intracranial atherosclerotic disease. No large vessel occlusion 4. Suboptimal MRA neck due to motion and lack of contrast. No hemodynamically significant stenosis in the carotid or vertebral arteries as described above. Electronically Signed   By: Marlan Palau M.D.   On: 01/08/2018 12:58   Mr Maxine Glenn Neck Wo Contrast  Result Date: 01/08/2018 CLINICAL DATA:  TIA.  Diabetes EXAM:  MRI HEAD WITHOUT CONTRAST MRA HEAD WITHOUT CONTRAST MRA NECK WITHOUT CONTRAST TECHNIQUE: Multiplanar, multiecho pulse sequences of the brain and surrounding structures were obtained without intravenous contrast. Angiographic images of the Circle of Willis were obtained using MRA technique without intravenous contrast. Angiographic images of the neck were obtained using MRA technique without intravenous contrast. Carotid stenosis measurements (when applicable) are obtained utilizing NASCET criteria, using the distal internal carotid diameter as the denominator. COMPARISON:  CT head 01/08/2018 FINDINGS: MRI HEAD FINDINGS Brain: Negative for acute infarct. Moderate atrophy without hydrocephalus. Moderate chronic microvascular ischemic change throughout the white matter extending into the pons bilaterally and thalamus bilaterally. Chronic infarcts in the right brachium pontis and right cerebellum. Negative for hemorrhage or mass. Vascular: Normal arterial flow voids Skull and upper cervical spine: Diffuse cervical spondylosis. Extensive arthropathy C1-2 with prominent pannus posterior to the dens causing moderate spinal stenosis and cord flattening on the right. Question rheumatoid arthritis versus CPPD or osteoarthritis. Sinuses/Orbits: Paranasal sinuses clear.  Bilateral cataract surgery Other: None MRA HEAD FINDINGS Both vertebral artery patent to the basilar. Basilar  widely patent. Posterior cerebral arteries are patent bilaterally with mild atherosclerotic disease distally bilaterally. Internal carotid artery patent bilaterally with moderate stenosis in the cavernous carotid bilaterally right greater than left. Anterior and middle cerebral arteries patent bilaterally. Moderate stenosis M2 segments bilaterally. Anterior cerebral arteries patent bilaterally. Negative for aneurysm. MRA NECK FINDINGS Suboptimal image quality due to motion and lack of intravenous contrast. Antegrade flow in carotid and vertebral arteries bilaterally. Proximal vertebral arteries are not imaged on the study related to artifact. Mid and distal vertebral arteries widely patent Internal carotid artery patent bilaterally. No high-grade stenosis. Limited detail through the carotid bifurcation. IMPRESSION: 1. Negative for acute infarct. Generalized atrophy with extensive chronic ischemic changes 2. Cervical spondylosis. Extensive C1-2 arthropathy with prominent pannus formation posterior to the dens causing spinal stenosis. Question rheumatoid arthritis 3. Mild to moderate intracranial atherosclerotic disease. No large vessel occlusion 4. Suboptimal MRA neck due to motion and lack of contrast. No hemodynamically significant stenosis in the carotid or vertebral arteries as described above. Electronically Signed   By: Marlan Palau M.D.   On: 01/08/2018 12:58   Mr Brain Wo Contrast  Result Date: 01/08/2018 CLINICAL DATA:  TIA.  Diabetes EXAM: MRI HEAD WITHOUT CONTRAST MRA HEAD WITHOUT CONTRAST MRA NECK WITHOUT CONTRAST TECHNIQUE: Multiplanar, multiecho pulse sequences of the brain and surrounding structures were obtained without intravenous contrast. Angiographic images of the Circle of Willis were obtained using MRA technique without intravenous contrast. Angiographic images of the neck were obtained using MRA technique without intravenous contrast. Carotid stenosis measurements (when applicable) are  obtained utilizing NASCET criteria, using the distal internal carotid diameter as the denominator. COMPARISON:  CT head 01/08/2018 FINDINGS: MRI HEAD FINDINGS Brain: Negative for acute infarct. Moderate atrophy without hydrocephalus. Moderate chronic microvascular ischemic change throughout the white matter extending into the pons bilaterally and thalamus bilaterally. Chronic infarcts in the right brachium pontis and right cerebellum. Negative for hemorrhage or mass. Vascular: Normal arterial flow voids Skull and upper cervical spine: Diffuse cervical spondylosis. Extensive arthropathy C1-2 with prominent pannus posterior to the dens causing moderate spinal stenosis and cord flattening on the right. Question rheumatoid arthritis versus CPPD or osteoarthritis. Sinuses/Orbits: Paranasal sinuses clear.  Bilateral cataract surgery Other: None MRA HEAD FINDINGS Both vertebral artery patent to the basilar. Basilar widely patent. Posterior cerebral arteries are patent bilaterally with mild atherosclerotic disease distally bilaterally. Internal carotid artery patent bilaterally with moderate  stenosis in the cavernous carotid bilaterally right greater than left. Anterior and middle cerebral arteries patent bilaterally. Moderate stenosis M2 segments bilaterally. Anterior cerebral arteries patent bilaterally. Negative for aneurysm. MRA NECK FINDINGS Suboptimal image quality due to motion and lack of intravenous contrast. Antegrade flow in carotid and vertebral arteries bilaterally. Proximal vertebral arteries are not imaged on the study related to artifact. Mid and distal vertebral arteries widely patent Internal carotid artery patent bilaterally. No high-grade stenosis. Limited detail through the carotid bifurcation. IMPRESSION: 1. Negative for acute infarct. Generalized atrophy with extensive chronic ischemic changes 2. Cervical spondylosis. Extensive C1-2 arthropathy with prominent pannus formation posterior to the dens  causing spinal stenosis. Question rheumatoid arthritis 3. Mild to moderate intracranial atherosclerotic disease. No large vessel occlusion 4. Suboptimal MRA neck due to motion and lack of contrast. No hemodynamically significant stenosis in the carotid or vertebral arteries as described above. Electronically Signed   By: Marlan Palau M.D.   On: 01/08/2018 12:58    Procedures Procedures (including critical care time)  Medications Ordered in ED Medications  heparin ADULT infusion 100 units/mL (25000 units/261mL sodium chloride 0.45%) (1,000 Units/hr Intravenous New Bag/Given 01/08/18 1306)  oxyCODONE-acetaminophen (PERCOCET/ROXICET) 5-325 MG per tablet 1 tablet (has no administration in time range)  cinacalcet (SENSIPAR) tablet 30 mg (has no administration in time range)  sevelamer carbonate (RENVELA) tablet 2,400 mg (2,400 mg Oral Given 01/08/18 1323)  clopidogrel (PLAVIX) tablet 75 mg (75 mg Oral Given 01/08/18 1249)  gabapentin (NEURONTIN) capsule 100 mg (has no administration in time range)  feeding supplement (PRO-STAT SUGAR FREE 64) liquid 30 mL (30 mLs Oral Given 01/08/18 1249)  feeding supplement (NEPRO CARB STEADY) liquid 237 mL (237 mLs Oral New Bag/Given 01/08/18 1319)   stroke: mapping our early stages of recovery book (has no administration in time range)  0.9 %  sodium chloride infusion ( Intravenous New Bag/Given 01/08/18 1311)  senna-docusate (Senokot-S) tablet 1 tablet (has no administration in time range)  metoprolol tartrate (LOPRESSOR) tablet 12.5 mg (0 mg Oral Hold 01/08/18 1223)  sevelamer carbonate (RENVELA) tablet 1,600 mg (has no administration in time range)  metoprolol tartrate (LOPRESSOR) injection 5 mg (5 mg Intravenous Given 01/08/18 0905)  heparin bolus via infusion 3,500 Units (3,500 Units Intravenous Bolus from Bag 01/08/18 1250)     Initial Impression / Assessment and Plan / ED Course  I have reviewed the triage vital signs and the nursing notes.  Pertinent labs & imaging  results that were available during my care of the patient were reviewed by me and considered in my medical decision making (see chart for details).     84 yo F with a chief complaint of right-sided gaze deviation and left-sided paralysis.  This lasted for about 45 minutes and then resolved.  Patient has a history of a stroke in the past that caused her to have left-sided weakness.  She is in atrial fibrillation with RVR on my exam.  We will give a dose of metoprolol.  Check labs CT the head discussed with neurology.  Discussed with Dr. Laurence Slate, recommended TIA workup.  Started on heparin for afib. Will discuss with the hospitalist.    The patients results and plan were reviewed and discussed.   Any x-rays performed were independently reviewed by myself.   Differential diagnosis were considered with the presenting HPI.  Medications  heparin ADULT infusion 100 units/mL (25000 units/218mL sodium chloride 0.45%) (1,000 Units/hr Intravenous New Bag/Given 01/08/18 1306)  oxyCODONE-acetaminophen (PERCOCET/ROXICET) 5-325 MG per tablet  1 tablet (has no administration in time range)  cinacalcet (SENSIPAR) tablet 30 mg (has no administration in time range)  sevelamer carbonate (RENVELA) tablet 2,400 mg (2,400 mg Oral Given 01/08/18 1323)  clopidogrel (PLAVIX) tablet 75 mg (75 mg Oral Given 01/08/18 1249)  gabapentin (NEURONTIN) capsule 100 mg (has no administration in time range)  feeding supplement (PRO-STAT SUGAR FREE 64) liquid 30 mL (30 mLs Oral Given 01/08/18 1249)  feeding supplement (NEPRO CARB STEADY) liquid 237 mL (237 mLs Oral New Bag/Given 01/08/18 1319)   stroke: mapping our early stages of recovery book (has no administration in time range)  0.9 %  sodium chloride infusion ( Intravenous New Bag/Given 01/08/18 1311)  senna-docusate (Senokot-S) tablet 1 tablet (has no administration in time range)  metoprolol tartrate (LOPRESSOR) tablet 12.5 mg (0 mg Oral Hold 01/08/18 1223)  sevelamer carbonate (RENVELA)  tablet 1,600 mg (has no administration in time range)  metoprolol tartrate (LOPRESSOR) injection 5 mg (5 mg Intravenous Given 01/08/18 0905)  heparin bolus via infusion 3,500 Units (3,500 Units Intravenous Bolus from Bag 01/08/18 1250)    Vitals:   01/08/18 1001 01/08/18 1028 01/08/18 1148 01/08/18 1215  BP: (!) 93/55 101/89 (!) 90/44 (!) 84/33  Pulse: (!) 113 (!) 107    Resp: 15 15  15   Temp:      TempSrc:      SpO2: 97% 96%      Final diagnoses:  TIA (transient ischemic attack)    Admission/ observation were discussed with the admitting physician, patient and/or family and they are comfortable with the plan.      Final Clinical Impressions(s) / ED Diagnoses   Final diagnoses:  TIA (transient ischemic attack)    ED Discharge Orders    None       Melene Plan, DO 01/08/18 1324

## 2018-01-08 NOTE — Consult Note (Signed)
[facility] Renal Consultation Note  Indication for Consultation:  Management of ESRD/hemodialysis; anemia, hypertension/volume and secondary hyperparathyroidism  HPI: [name] is a 84 y.o. female with ESRD 2/2 DM on Chronic HD (2008 start)TTS ( [place] . 7 /2019 now at [facility]) - HTN, depression,Hx CVA (2015), [facility] adm 10/2012 H flu sepsis, c diff colitis, PNA, SVT/afib  change to NSR on diltiazem/no Coumadin.      Now admitted with A. Fib  RVR/ TIA  Workup .Marland Kitchen BY ER Notes history = per son this am had  new Right sided gaze deviation, Left side weakness lasting for 45 min  And she was  taken to ER. Found to have RVR / A fib admitted on Heparin. MRA Head Neg for acute Infarct /pos generalized atrophy , extensive Chronic Ischemic. changes . Neurology is consulting .     Currently son not in room and patient not aware of symptoms per her history.  She reports no problems on HD yesterday / she had 2 liter  uf with Bp 102/86 / pulse 102  Pre hd and post hd bp  100/65  Pulse 95 (normal vitals  For her on hd). Only problems per [name] are nonpainful Left Foot wounds delaying her Rehab at [facility] and BKA wound problem after applying prosthesis too early .Denying CP. SOB, N/V/D. Palpitations.     Past Medical History:  Diagnosis Date  . Anemia   . C. difficile diarrhea   . Chronic kidney disease    on Dialysis  . Depression   . Diabetes mellitus   . Diabetic neuropathy ([place])   . Gangrene ([place])    right foot  . GERD (gastroesophageal reflux disease)   . Gout   . Hyperparathyroidism   . MRSA (methicillin resistant staph aureus) culture positive   . Obesity   . Osteoarthritis   . Peripheral arterial disease (HCC)    Gangrene-Left Great Toe  . Shortness of breath dyspnea    with exertion  . Stroke [facility])     Past Surgical History:  Procedure Laterality Date  . ABDOMINAL AORTOGRAM N/A 07/05/2016   Procedure: Abdominal Aortogram;  Surgeon: Waynetta Sandy, MD;  Location: [place] CV LAB;  Service: Cardiovascular;  Laterality: N/A;  . ABDOMINAL AORTOGRAM N/A 06/24/2017   Procedure: ABDOMINAL AORTOGRAM;  Surgeon: Elam Dutch, MD;  Location: [place] CV LAB;  Service: Cardiovascular;  Laterality: N/A;  . ABDOMINAL HYSTERECTOMY    . AMPUTATION Right 11/11/2017   Procedure: RIGHT BELOW KNEE AMPUTATION;  Surgeon: Newt Minion, MD;  Location: [place];  Service: Orthopedics;  Laterality: Right;  . AV FISTULA PLACEMENT Left 07/07/2015   Procedure: ATTEMPTED INSERTION OFLEFT  ARTERIOVENOUS (AV) GORE-TEX GRAFT THIGH;  Surgeon: Conrad Hancock, MD;  Location: [place];  Service: Vascular;  Laterality: Left;  . BELOW KNEE LEG AMPUTATION Right 11/11/2017  . BREAST EXCISIONAL BIOPSY Right 05/25/2006  . COLONOSCOPY    . LOWER EXTREMITY ANGIOGRAPHY Bilateral 07/05/2016   Procedure: Lower Extremity Angiography;  Surgeon: Waynetta Sandy, MD;  Location: [place] CV LAB;  Service: Cardiovascular;  Laterality: Bilateral;  . LOWER EXTREMITY ANGIOGRAPHY Bilateral 06/24/2017   Procedure: Lower Extremity Angiography;  Surgeon: Elam Dutch, MD;  Location: [place] CV LAB;  Service: Cardiovascular;  Laterality: Bilateral;  . MULTIPLE TOOTH EXTRACTIONS    . PERIPHERAL VASCULAR INTERVENTION Right 06/24/2017   Procedure: PERIPHERAL VASCULAR INTERVENTION;  Surgeon: Elam Dutch, MD;  Location:  [place] INVASIVE CV LAB;  Service: Cardiovascular;  Laterality: Right;  POPLITEALPTA SFA STENT  . SHUNTOGRAM Right 09/06/12   Thigh graft  . SP DECLOT AVGG Right Sep 14, 2012   Right thigh graft      Family History  Problem Relation Age of Onset  . Cancer Father        type unknown  . Stroke Mother   . Diabetes Sister   . Cancer Brother        type unknown      reports that she has quit smoking. Her smoking use included cigarettes. She has never used smokeless tobacco. She reports that she does not drink alcohol or use drugs.    Allergies  Allergen Reactions  . Codeine Hives  . Vancomycin Rash    Prior to Admission medications   Medication Sig Start Date End Date Taking? Authorizing Provider  Amino Acids-Protein Hydrolys (FEEDING SUPPLEMENT, PRO-STAT SUGAR FREE 64,) LIQD Take 30 mLs by mouth daily.    Yes [provider]  clopidogrel (PLAVIX) 75 MG tablet Take 1 tablet (75 mg total) by mouth daily. 06/24/17  Yes Fields, Jessy Oto, MD  gabapentin (NEURONTIN) 100 MG capsule Take 100 mg by mouth at bedtime.  10/10/13  Yes [provider]  Nutritional Supplements (FEEDING SUPPLEMENT, NEPRO CARB STEADY,) LIQD Take 237 mLs by mouth daily.   Yes [provider]  oxyCODONE-acetaminophen (PERCOCET/ROXICET) 5-325 MG tablet Take 1 tablet by mouth every 4 (four) hours as needed. 11/15/17  Yes Newt Minion, MD  RENVELA 800 MG tablet Take 1,600-2,400 mg by mouth See admin instructions. Take 2400 mg by mouth 3 times daily with meals and take 1600 mg by mouth with snacks 11/20/14  Yes [provider]  SENSIPAR 30 MG tablet Take 30 mg by mouth every evening.  04/13/13  Yes [provider]    XTA:VWPVXYIAX-KPVVZSMOLMBEM, senna-docusate  Results for orders placed or performed during the hospital encounter of 01/08/18 (from the past 48 hour(s))  Ethanol     Status: None   Collection Time: 01/08/18  8:54 AM  Result Value Ref Range   Alcohol, Ethyl (B) <10 <10 mg/dL    Comment: (NOTE) Lowest detectable limit for serum alcohol is 10 mg/dL. For medical purposes only. Performed at [facility], [place] [address], [place], [place] [address]   Protime-INR     Status: None   Collection Time: 01/08/18  8:54 AM  Result Value Ref Range   Prothrombin Time 14.0 11.4 - 15.2 seconds   INR 1.09     Comment: Performed at [facility] [address] [place], [place] [address]  APTT     Status: None   Collection Time: 01/08/18  8:54 AM  Result Value Ref Range   aPTT 33 24 - 36 seconds     Comment: Performed at [facility] [address] [place], [place] [address]  CBC     Status: Abnormal   Collection Time: 01/08/18  8:54 AM  Result Value Ref Range   WBC 10.2 4.0 - 10.5 K/uL   RBC 4.91 3.87 - 5.11 MIL/uL   Hemoglobin 11.2 (L) 12.0 - 15.0 g/dL   HCT 41.2 36.0 - 46.0 %   MCV 83.9 78.0 - 100.0 fL   MCH 22.8 (L) 26.0 - 34.0 pg   MCHC 27.2 (L) 30.0 - 36.0 g/dL   RDW 24.6 (H) 11.5 - 15.5 %   Platelets 284 150 - 400 K/uL  Comment: Performed at [facility], [place] [address], [place], [place] [address]  Differential     Status: Abnormal   Collection Time: 01/08/18  8:54 AM  Result Value Ref Range   Neutrophils Relative % 78 %   Lymphocytes Relative 14 %   Monocytes Relative 5 %   Eosinophils Relative 2 %   Basophils Relative 1 %   Neutro Abs 8.0 (H) 1.7 - 7.7 K/uL   Lymphs Abs 1.4 0.7 - 4.0 K/uL   Monocytes Absolute 0.5 0.1 - 1.0 K/uL   Eosinophils Absolute 0.2 0.0 - 0.7 K/uL   Basophils Absolute 0.1 0.0 - 0.1 K/uL   RBC Morphology POLYCHROMASIA PRESENT     Comment: Performed at [facility] [address] [place], [place] [address]  Comprehensive metabolic panel     Status: Abnormal   Collection Time: 01/08/18  8:54 AM  Result Value Ref Range   Sodium 139 135 - 145 mmol/L   Potassium 3.7 3.5 - 5.1 mmol/L   Chloride 102 98 - 111 mmol/L   CO2 26 22 - 32 mmol/L   Glucose, Bld 181 (H) 70 - 99 mg/dL   BUN 35 (H) 8 - 23 mg/dL   Creatinine, Ser 5.10 (H) 0.44 - 1.00 mg/dL   Calcium 9.0 8.9 - 10.3 mg/dL   Total Protein 7.2 6.5 - 8.1 g/dL   Albumin 3.1 (L) 3.5 - 5.0 g/dL   AST 18 15 - 41 U/L   ALT 14 0 - 44 U/L   Alkaline Phosphatase 110 38 - 126 U/L   Total Bilirubin 0.7 0.3 - 1.2 mg/dL   GFR calc non Af Amer 7 (L) >60 mL/min   GFR calc Af Amer 8 (L) >60 mL/min    Comment: (NOTE) The eGFR has been calculated using the CKD EPI equation. This calculation has not been validated in all clinical situations. eGFR's persistently <60 mL/min signify  possible Chronic Kidney Disease.    Anion gap 11 5 - 15    Comment: Performed at [facility] [address] [place], [place] [address]  I-stat troponin, ED     Status: None   Collection Time: 01/08/18  8:59 AM  Result Value Ref Range   Troponin i, poc 0.03 0.00 - 0.08 ng/mL   Comment 3            Comment: Due to the release kinetics of cTnI, a negative result within the first hours of the onset of symptoms does not rule out myocardial infarction with certainty. If myocardial infarction is still suspected, repeat the test at appropriate intervals.   I-Stat Chem 8, ED     Status: Abnormal   Collection Time: 01/08/18  9:01 AM  Result Value Ref Range   Sodium 140 135 - 145 mmol/L   Potassium 3.5 3.5 - 5.1 mmol/L   Chloride 101 98 - 111 mmol/L   BUN 36 (H) 8 - 23 mg/dL   Creatinine, Ser 5.30 (H) 0.44 - 1.00 mg/dL   Glucose, Bld 185 (H) 70 - 99 mg/dL   Calcium, Ion 1.11 (L) 1.15 - 1.40 mmol/L   TCO2 26 22 - 32 mmol/L   Hemoglobin 14.3 12.0 - 15.0 g/dL   HCT 42.0 36.0 - 46.0 %     ROS: see hpi   Physical Exam: Vitals:   01/08/18 1148 01/08/18 1215  BP: (!) 90/44 (!) 84/33  Pulse:    Resp:  15  Temp:    SpO2:  General: alert , elderly AAF , NAD , Chronically ill appearing , OX3 HEENT: [place] , MMM, [place] Neck: supple, no jvd Heart: Irreg , irreg  Tachy , no rub, gallop ,mur  Appreciated  Lungs: CTA , nonlabored breathing  Abdomen: obese, bs pos. ,soft , NT, ND Extremities: R BKA stump wrap not removed , L foot bandaged not removed , trace  Left pedal edema  Skin: dry , no overt rash  Neuro: Alert , OX3, moves all extem to request , no left side weakness appreciated now  Dialysis Access: R IJ P Cath    OP  Dialysis Orders: Center: [facility]   on TTS . EDW 78  HD Bath 2 k/ 2.25 CA   Time 4hr Heparin 5000.  RIJ PCath ("Dr Bridgett Larsson felt no other Access available ")  Mircera 150 mcg q 2wks (given 12/29/17  last op hgb 10.6 (01/05/18)  Assessment/Plan 1. ESRD -   HD on TTS , No acute needs for hd today , continue MWF schedule for now  2. A fib with RVR - RX  per admit team, Iv Hep / Metoprolol  3. TIA  -wu with Neuro consulting 4. HO CVA in past  5. Hypertension/volume  -  bp slightly lower for her ( has present pre hd SBP 90s  And asymp. for her  6. Anemia  - HGB 11.2 no esa needs this week ( Mircera given 12/29/17)  Fu hgb trend  7. Metabolic bone disease - Renvela binder , Sensipar  q day , no  Er Ca ok , no  vit d on hd  8. Nutrition - Renal /Carb  Diet / renal vit  9. HO  Recent R BKA / L foot diabetic wound - wound care per admit  10. HO DM type 2 - per admit   Ernest Haber, PA-C [place] [phone] 01/08/2018, 2:37 PM

## 2018-01-08 NOTE — Progress Notes (Signed)
ANTICOAGULATION CONSULT NOTE Pharmacy Consult for Heparin Indication: atrial fibrillation  Allergies  Allergen Reactions  . Codeine Hives  . Vancomycin Rash    Patient Measurements: Heparin Dosing Weight: 68.9 kg  Vital Signs: Temp: 97.6 F (36.4 C) (09/01 2045) Temp Source: Oral (09/01 2045) BP: 104/88 (09/01 1720) Pulse Rate: 107 (09/01 1028)  Labs: Recent Labs    01/08/18 0854 01/08/18 0901 01/08/18 1343 01/08/18 2056  HGB 11.2* 14.3  --   --   HCT 41.2 42.0  --   --   PLT 284  --   --   --   APTT 33  --   --   --   LABPROT 14.0  --   --   --   INR 1.09  --   --   --   HEPARINUNFRC  --   --   --  0.36  CREATININE 5.10* 5.30*  --   --   TROPONINI  --   --  <0.03  --     Estimated Creatinine Clearance: 7.9 mL/min (A) (by C-G formula based on SCr of 5.3 mg/dL (H)).   Medications:  Plavix prior to admission - otherwise no anticoagulation   Assessment: 84 year old female with past medical history significant for stroke (2015), HTN, DM, ESRD-HD, R-BKA in July, and peripheral arterial disease who was brought to ED today from [facility] for R-sided gaze deviation and L-sided weakness x45 minutes then resolved. She was found to be in atrial fibrillation with RVR on exam and pharmacy consulted to start IV Heparin.   Head CT 9/1 >> no acute abnormality including no evidence of acute infarct or bleeding. MRI 9/1 >> negative for acute infarct  Heparin level therapeutic this evening  Goal of Therapy:  Heparin level 0.3-0.7 units/ml Monitor platelets by anticoagulation protocol: Yes   Plan:  Continue heparin at 1000 units / hr Daily Heparin level and CBC while on therapy.   Thank you Okey Regal, PharmD [phone] 01/08/2018,9:33 PM

## 2018-01-08 NOTE — H&P (Signed)
History and Physical  [name]:[id number] DOB: August 11, 1932 DOA: 01/08/2018  Referring physician: Dr. Melene Plan emergency department PCP: Mila Palmer, MD  Outpatient Specialists:Wagoner, Lesia Sago, DPM as Consulting Physician (Podiatry) Terrial Rhodes, MD as Consulting Physician (Nephrology)   Patient coming from:She is a short-term rehabilitation resident of [facility] & is  NOT able to ambulate   Chief Complaint: Slurred speech  HPI: [name] is a 84 y.o. female with medical history significant for PAD, DM, stroke, OA, secondary hyperparathyroidism, history of gout, GERD, depression, and history of C. difficile diarrhea.    She presents to the emergency department with slurring of speech and not able to turn her eyes and look at a certain direction.  His son came to visit her at the rehab place and he felt that her speech was slurred and she was not moving her left side of the body this lasted for about 45 minutes then resolved.  At the time of my examination the family member at the bedside thought that her speech was back to normal she is in short-term rehabilitation at [facility] living in rehabilitation for recent right BKA.She had some wound dehiscence at the stump after applying a prosthesis too early.  She has a couple other chronic wounds that are also being managed there is one in the left heel and another one in the right inner thigh.  They deny cough congestion fever denies vomiting or diarrhea denies dark stool or blood in the stool.  Denies head injury or loss consciousness. History was obtained from patient and related to 4 at bedside and from the ED physician  ED Course: Metoprolol for A. fib with rapid ventricular rate  Review of Systems: GENERAL: No change in appetite, no fatigue, no weight changes, no fever, chills or weakness SKIN: Denies rash, itching, wounds, ulcer sores, or nail abnormalities EYES: Denies change in vision, dry  eyes, eye pain, itching or discharge EARS: Denies change in hearing, ringing in ears, or earache NOSE: Denies nasal congestion or epistaxis MOUTH and THROAT: Denies oral discomfort, gingival pain or bleeding, pain from teeth or hoarseness   RESPIRATORY: no cough, SOB, DOE, wheezing, hemoptysis CARDIAC: No chest pain, edema or palpitations GI: No abdominal pain, diarrhea, constipation, heart burn, nausea or vomiting GU: Denies dysuria, frequency, hematuria, incontinence, or discharge MUSCULOSKELETAL: Denies joint pain, muscle pain, back pain, restricted movement, or unusual weakness CIRCULATION: Denies claudication, edema of legs, varicosities, or cold extremities NEUROLOGICAL: Positive for speech difficulty and weakness. Denies dizziness, syncope, numbness, or headache PSYCHIATRIC: Denies feelings of depression or anxiety. No report of hallucinations, insomnia, paranoia, or agitation ENDOCRINE: Denies polyphagia, polyuria, polydipsia, heat or cold intolerance HEME/LYMPH: Denies excessive bruising, petechia, enlarged lymph nodes, or bleeding problems IMMUNOLOGIC: Denies history of frequent infections, AIDS, or use of immunosuppressive agents   Past Medical History:  Diagnosis Date  . Anemia   . C. difficile diarrhea   . Chronic kidney disease    on Dialysis  . Depression   . Diabetes mellitus   . Diabetic neuropathy (HCC)   . Gangrene (HCC)    right foot  . GERD (gastroesophageal reflux disease)   . Gout   . Hyperparathyroidism   . MRSA (methicillin resistant staph aureus) culture positive   . Obesity   . Osteoarthritis   . Peripheral arterial disease (HCC)    Gangrene-Left Great Toe  . Shortness of breath dyspnea    with exertion  . Stroke [facility])    Past  Surgical History:  Procedure Laterality Date  . ABDOMINAL AORTOGRAM N/A 07/05/2016   Procedure: Abdominal Aortogram;  Surgeon: Maeola Harman, MD;  Location: [facility] INVASIVE CV LAB;  Service: Cardiovascular;   Laterality: N/A;  . ABDOMINAL AORTOGRAM N/A 06/24/2017   Procedure: ABDOMINAL AORTOGRAM;  Surgeon: Sherren Kerns, MD;  Location: [facility] INVASIVE CV LAB;  Service: Cardiovascular;  Laterality: N/A;  . ABDOMINAL HYSTERECTOMY    . AMPUTATION Right 11/11/2017   Procedure: RIGHT BELOW KNEE AMPUTATION;  Surgeon: Nadara Mustard, MD;  Location: [facility] OR;  Service: Orthopedics;  Laterality: Right;  . AV FISTULA PLACEMENT Left 07/07/2015   Procedure: ATTEMPTED INSERTION OFLEFT  ARTERIOVENOUS (AV) GORE-TEX GRAFT THIGH;  Surgeon: Fransisco Hertz, MD;  Location: [facility] OR;  Service: Vascular;  Laterality: Left;  . BELOW KNEE LEG AMPUTATION Right 11/11/2017  . BREAST EXCISIONAL BIOPSY Right 05/25/2006  . COLONOSCOPY    . LOWER EXTREMITY ANGIOGRAPHY Bilateral 07/05/2016   Procedure: Lower Extremity Angiography;  Surgeon: Maeola Harman, MD;  Location: [facility] INVASIVE CV LAB;  Service: Cardiovascular;  Laterality: Bilateral;  . LOWER EXTREMITY ANGIOGRAPHY Bilateral 06/24/2017   Procedure: Lower Extremity Angiography;  Surgeon: Sherren Kerns, MD;  Location: [facility] INVASIVE CV LAB;  Service: Cardiovascular;  Laterality: Bilateral;  . MULTIPLE TOOTH EXTRACTIONS    . PERIPHERAL VASCULAR INTERVENTION Right 06/24/2017   Procedure: PERIPHERAL VASCULAR INTERVENTION;  Surgeon: Sherren Kerns, MD;  Location: [facility] INVASIVE CV LAB;  Service: Cardiovascular;  Laterality: Right;  POPLITEALPTA SFA STENT  . SHUNTOGRAM Right 09/06/12   Thigh graft  . SP DECLOT AVGG Right Sep 14, 2012   Right thigh graft    Social History:  reports that she has quit smoking. Her smoking use included cigarettes. She has never used smokeless tobacco. She reports that she does not drink alcohol or use drugs.   Allergies  Allergen Reactions  . Codeine Hives  . Vancomycin Rash    Family History  Problem Relation Age of Onset  . Cancer Father        type unknown  . Stroke Mother   . Diabetes Sister   . Cancer Brother        type unknown      Prior to Admission medications   Medication Sig Start Date End Date Taking? Authorizing Provider  Amino Acids-Protein Hydrolys (FEEDING SUPPLEMENT, PRO-STAT SUGAR FREE 64,) LIQD Take 30 mLs by mouth daily.    Yes [provider]  clopidogrel (PLAVIX) 75 MG tablet Take 1 tablet (75 mg total) by mouth daily. 06/24/17  Yes Fields, [name], MD  gabapentin (NEURONTIN) 100 MG capsule Take 100 mg by mouth at bedtime.  10/10/13  Yes [provider]  Nutritional Supplements (FEEDING SUPPLEMENT, NEPRO CARB STEADY,) LIQD Take 237 mLs by mouth daily.   Yes [provider]  oxyCODONE-acetaminophen (PERCOCET/ROXICET) 5-325 MG tablet Take 1 tablet by mouth every 4 (four) hours as needed. 11/15/17  Yes Nadara Mustard, MD  RENVELA 800 MG tablet Take 1,600-2,400 mg by mouth See admin instructions. Take 2400 mg by mouth 3 times daily with meals and take 1600 mg by mouth with snacks 11/20/14  Yes [provider]  SENSIPAR 30 MG tablet Take 30 mg by mouth every evening.  04/13/13  Yes [provider]    Physical Exam: BP 101/89   Pulse (!) 107   Temp 97.7 F (36.5 C) (Oral)   Resp 15   SpO2 96%   Constitutional: She is oriented to person, place,  and time. She appears well-developed and well-nourished. No distress.  HENT:  Head: Normocephalic and atraumatic.  Eyes: Pupils are equal, round, and reactive to light. EOM are normal.  She wears glasses no jaundice or pallor Neck: Normal range of motion. Neck supple.  Cardiovascular: An irregularly irregular rhythm present. Tachycardia present. Exam reveals no gallop and no friction rub.  No murmur heard. Pulmonary/Chest: Effort normal. She has no wheezes. She has no rales.  Abdominal: Soft. She exhibits no distension and no mass. There is no tenderness. There is no guarding.  Musculoskeletal: She exhibits no edema or tenderness.  Right BKA Neurological: She is alert and oriented to person, place, and time. She has normal  strength. No cranial nerve deficit or sensory deficit. Coordination normal.Gait deferred with R bka  Skin: Skin is warm and dry. She is not diaphoretic.   She has this shallow scabby ulcer on the right knee that is healing also also in the right inner thigh and on the left heel         Labs on Admission:  Basic Metabolic Panel: Recent Labs  Lab 01/08/18 0854 01/08/18 0901  NA 139 140  K 3.7 3.5  CL 102 101  CO2 26  --   GLUCOSE 181* 185*  BUN 35* 36*  CREATININE 5.10* 5.30*  CALCIUM 9.0  --    Liver Function Tests: Recent Labs  Lab 01/08/18 0854  AST 18  ALT 14  ALKPHOS 110  BILITOT 0.7  PROT 7.2  ALBUMIN 3.1*   No results for input(s): LIPASE, AMYLASE in the last 168 hours. No results for input(s): AMMONIA in the last 168 hours. CBC: Recent Labs  Lab 01/08/18 0854 01/08/18 0901  WBC 10.2  --   NEUTROABS 8.0*  --   HGB 11.2* 14.3  HCT 41.2 42.0  MCV 83.9  --   PLT 284  --    Cardiac Enzymes: No results for input(s): CKTOTAL, CKMB, CKMBINDEX, TROPONINI in the last 168 hours.  BNP (last 3 results) No results for input(s): BNP in the last 8760 hours.  ProBNP (last 3 results) No results for input(s): PROBNP in the last 8760 hours.  CBG: No results for input(s): GLUCAP in the last 168 hours.  Radiological Exams on Admission: Dg Chest 2 View  Result Date: 01/08/2018 CLINICAL DATA:  LEFT-sided weakness.  Concern for infarct EXAM: CHEST - 2 VIEW COMPARISON:  06/27/2017 FINDINGS: Large-bore central venous line again noted. Normal cardiac silhouette. Low lung volumes. No pulmonary edema. Degenerative spurring of the spine. IMPRESSION: Low lung volumes.  No acute findings. Electronically Signed   By: Genevive Bi M.D.   On: 01/08/2018 09:44   Ct Head Wo Contrast  Result Date: 01/08/2018 CLINICAL DATA:  Onset left-sided weakness this morning. EXAM: CT HEAD WITHOUT CONTRAST TECHNIQUE: Contiguous axial images were obtained from the base of the skull through the  vertex without intravenous contrast. COMPARISON:  Head CT scan 07/08/2015. FINDINGS: Brain: No evidence of acute infarction, hemorrhage, hydrocephalus, extra-axial collection or mass lesion/mass effect. Atrophy and extensive chronic microvascular ischemic change are again seen. Asymmetric atrophy of the right cerebellum relative to the left is chronic and unchanged. Vascular: Atherosclerosis noted. Skull: Intact.  No focal lesion. Sinuses/Orbits: Status post bilateral lens extraction. Otherwise negative. Other: None. IMPRESSION: No acute abnormality. Atrophy and extensive chronic microvascular ischemic change. Atherosclerosis. Electronically Signed   By: Drusilla Kanner M.D.   On: 01/08/2018 09:35    EKG: Independently reviewed.   Assessment/Plan Present on Admission: **  None**  Principal Problem:   TIA (transient ischemic attack)  Transient ischemic attack ER physician consulted neurology and he advised to admit patient and start her on heparin drip New onset atrial fibrillation with rapid ventricular rate she was given metoprolol in the ED Type 2 diabetes mellitus Multiple ulcers End-stage renal disease on hemodialysis Tuesday Thursdays and Saturday.  Nephrology consulted for hemodialysis on her scheduled day Hypertension patient was given a bolus of 150 mg mils of normal saline due to the hypotension she was not able to get her repeat dose of metoprolol for the atrial fibrillation initially the metoprolol was held her pressure is much better now   DVT prophylaxis: HEPARIN  Code Status: FULL  Family Communication: Brother sister and daughters at bedside  Disposition Plan: home  Consults called: Nephrology Neurology  Admission status: inpatient    Myrtie Neither MD [facility] Pager [phone]  If 7PM-7AM, please contact night-coverage [email or web address] Password [email or web address]  01/08/2018, 11:25 AM

## 2018-01-08 NOTE — Consult Note (Signed)
WOC Nurse wound consult note Reason for Consult: left heel Unstageable pressure injury, right stump healing full thickness wound (incision) and right medial thigh full thickness wound (healing) Wound type: Pressure, surgical Pressure Injury POA: Yes Measurement: Left Heel Unstageable PrI:  5cm x 6cm stable black eschar with separated edge and 0.8cm x 2cm area revealing yellow dry tissue Right medial thigh: medial 1/3 of 8cm wound is still in [progress of wound repair and regeneration. 2cm x 3cm with yellow and pink wound bed, scant drainage (light yellow). Right stump lateral aspect (healing): 0.5cm x 3.2cm area with dried yellow wound bed. No drainage. Wound bed:as described above Drainage (amount, consistency, odor) As described above Periwound:dry, intact Dressing procedure/placement/frequency: I will implement a conservative POC using a twice daily betadine swabstick to the left heel Unstageable PrI followed by placement of the foot into a pressure redistribution heel boot. The right medial thigh and the right stump with portions yet to heal will be treated with daily dressing changes using xeroform gauze for its antimicrobial and astringent properties. A sacral prophylactic silicone foam dressing will be placed for PrI prevention, but patient must continue to change position off of the supine position. She and I discuss this plan and she is in agreement.  WOC nursing team will not follow, but will remain available to this patient, the nursing and medical teams.  Please re-consult if needed. Thanks, Ladona Mow, MSN, RN, GNP, [place]  Pager# [phone]

## 2018-01-08 NOTE — ED Notes (Signed)
Patient transported to MRI 

## 2018-01-08 NOTE — Progress Notes (Signed)
ANTICOAGULATION CONSULT NOTE - Initial Consult  Pharmacy Consult for Heparin Indication: atrial fibrillation  Allergies  Allergen Reactions  . Codeine Hives  . Vancomycin Rash    Patient Measurements: Heparin Dosing Weight: 68.9 kg  Vital Signs: Temp: 97.7 F (36.5 C) (09/01 0848) Temp Source: Oral (09/01 0848) BP: 100/57 (09/01 0944) Pulse Rate: 107 (09/01 0944)  Labs: Recent Labs    01/08/18 0854 01/08/18 0901  HGB 11.2* 14.3  HCT 41.2 42.0  PLT 284  --   APTT 33  --   LABPROT 14.0  --   INR 1.09  --   CREATININE  --  5.30*    Estimated Creatinine Clearance: 7.9 mL/min (A) (by C-G formula based on SCr of 5.3 mg/dL (H)).   Medical History: Past Medical History:  Diagnosis Date  . Anemia   . C. difficile diarrhea   . Chronic kidney disease    on Dialysis  . Depression   . Diabetes mellitus   . Diabetic neuropathy (HCC)   . Gangrene (HCC)    right foot  . GERD (gastroesophageal reflux disease)   . Gout   . Hyperparathyroidism   . MRSA (methicillin resistant staph aureus) culture positive   . Obesity   . Osteoarthritis   . Peripheral arterial disease (HCC)    Gangrene-Left Great Toe  . Shortness of breath dyspnea    with exertion  . Stroke [facility])     Medications:  Plavix prior to admission - otherwise no anticoagulation   Assessment: 84 year old female with past medical history significant for stroke (2015), HTN, DM, ESRD-HD, R-BKA in July, and peripheral arterial disease who was brought to ED today from [facility] for R-sided gaze deviation and L-sided weakness x45 minutes then resolved. She was found to be in atrial fibrillation with RVR on exam and pharmacy consulted to start IV Heparin.   Head CT 9/1 >> no acute abnormality including no evidence of acute infarct or bleeding. MRI 9/1 >> negative for acute infarct  H/H is within normal limits. Platelets are normal. INR 1.09 on admission. APTT 33. Troponin 0.03. No bleeding noted.   CHADs-VASC = 8.   Goal of Therapy:  Heparin level 0.3-0.7 units/ml Monitor platelets by anticoagulation protocol: Yes   Plan:  Heparin bolus of 3500 units x1 Heparin drip at 1000 units/hr Heparin level in 8 hours Daily Heparin level and CBC while on therapy.   Link Snuffer, PharmD, BCPS, BCCCP Clinical Pharmacist Clinical phone 01/08/2018 until 3:30PM [phone] Please refer to [facility] for [facility] Pharmacy numbers 01/08/2018,10:09 AM

## 2018-01-09 ENCOUNTER — Encounter (HOSPITAL_COMMUNITY): Payer: Self-pay | Admitting: General Practice

## 2018-01-09 ENCOUNTER — Inpatient Hospital Stay (HOSPITAL_COMMUNITY): Payer: Medicare Other

## 2018-01-09 DIAGNOSIS — G459 Transient cerebral ischemic attack, unspecified: Secondary | ICD-10-CM

## 2018-01-09 DIAGNOSIS — I739 Peripheral vascular disease, unspecified: Secondary | ICD-10-CM

## 2018-01-09 DIAGNOSIS — I4891 Unspecified atrial fibrillation: Principal | ICD-10-CM

## 2018-01-09 DIAGNOSIS — L8990 Pressure ulcer of unspecified site, unspecified stage: Secondary | ICD-10-CM

## 2018-01-09 DIAGNOSIS — I953 Hypotension of hemodialysis: Secondary | ICD-10-CM

## 2018-01-09 LAB — GLUCOSE, CAPILLARY
GLUCOSE-CAPILLARY: 125 mg/dL — AB (ref 70–99)
GLUCOSE-CAPILLARY: 116 mg/dL — AB (ref 70–99)
GLUCOSE-CAPILLARY: 139 mg/dL — AB (ref 70–99)
Glucose-Capillary: 151 mg/dL — ABNORMAL HIGH (ref 70–99)

## 2018-01-09 LAB — HEMOGLOBIN A1C
Hgb A1c MFr Bld: 5.5 % (ref 4.8–5.6)
Mean Plasma Glucose: 111.15 mg/dL

## 2018-01-09 LAB — CBC
HEMATOCRIT: 37.7 % (ref 36.0–46.0)
Hemoglobin: 10.6 g/dL — ABNORMAL LOW (ref 12.0–15.0)
MCH: 22.8 pg — ABNORMAL LOW (ref 26.0–34.0)
MCHC: 28.1 g/dL — ABNORMAL LOW (ref 30.0–36.0)
MCV: 81.3 fL (ref 78.0–100.0)
Platelets: 271 10*3/uL (ref 150–400)
RBC: 4.64 MIL/uL (ref 3.87–5.11)
RDW: 24.1 % — AB (ref 11.5–15.5)
WBC: 10.9 10*3/uL — AB (ref 4.0–10.5)

## 2018-01-09 LAB — COMPREHENSIVE METABOLIC PANEL
ALBUMIN: 2.8 g/dL — AB (ref 3.5–5.0)
ALT: 13 U/L (ref 0–44)
ANION GAP: 11 (ref 5–15)
AST: 16 U/L (ref 15–41)
Alkaline Phosphatase: 88 U/L (ref 38–126)
BILIRUBIN TOTAL: 0.8 mg/dL (ref 0.3–1.2)
BUN: 46 mg/dL — ABNORMAL HIGH (ref 8–23)
CO2: 27 mmol/L (ref 22–32)
Calcium: 8.5 mg/dL — ABNORMAL LOW (ref 8.9–10.3)
Chloride: 99 mmol/L (ref 98–111)
Creatinine, Ser: 6.2 mg/dL — ABNORMAL HIGH (ref 0.44–1.00)
GFR, EST AFRICAN AMERICAN: 6 mL/min — AB (ref >60)
GFR, EST NON AFRICAN AMERICAN: 6 mL/min — AB (ref >60)
GLUCOSE: 131 mg/dL — AB (ref 70–99)
POTASSIUM: 4.3 mmol/L (ref 3.5–5.1)
Sodium: 137 mmol/L (ref 135–145)
TOTAL PROTEIN: 6.2 g/dL — AB (ref 6.5–8.1)

## 2018-01-09 LAB — LIPID PANEL
CHOL/HDL RATIO: 1.7 ratio
CHOLESTEROL: 77 mg/dL (ref 0–200)
HDL: 46 mg/dL (ref >40)
LDL Cholesterol: 22 mg/dL (ref 0–99)
Triglycerides: 44 mg/dL (ref <150)
VLDL: 9 mg/dL (ref 0–40)

## 2018-01-09 LAB — HEPARIN LEVEL (UNFRACTIONATED)
HEPARIN UNFRACTIONATED: 0.48 [IU]/mL (ref 0.30–0.70)
Heparin Unfractionated: 0.15 IU/mL — ABNORMAL LOW (ref 0.30–0.70)

## 2018-01-09 LAB — APTT: APTT: 74 s — AB (ref 24–36)

## 2018-01-09 MED ORDER — AMIODARONE IV BOLUS ONLY 150 MG/100ML
150.0000 mg | Freq: Once | INTRAVENOUS | Status: AC
  Administered 2018-01-09: 150 mg via INTRAVENOUS
  Filled 2018-01-09: qty 100

## 2018-01-09 MED ORDER — APIXABAN 2.5 MG PO TABS
2.5000 mg | ORAL_TABLET | Freq: Two times a day (BID) | ORAL | Status: DC
  Administered 2018-01-09 – 2018-01-12 (×7): 2.5 mg via ORAL
  Filled 2018-01-09 (×7): qty 1

## 2018-01-09 MED ORDER — SODIUM CHLORIDE 0.9 % IV BOLUS
250.0000 mL | Freq: Once | INTRAVENOUS | Status: AC
  Administered 2018-01-09: 250 mL via INTRAVENOUS

## 2018-01-09 MED ORDER — CHLORHEXIDINE GLUCONATE CLOTH 2 % EX PADS
6.0000 | MEDICATED_PAD | Freq: Every day | CUTANEOUS | Status: DC
  Administered 2018-01-10: 6 via TOPICAL

## 2018-01-09 MED ORDER — DIGOXIN 0.25 MG/ML IJ SOLN
0.2500 mg | Freq: Every day | INTRAMUSCULAR | Status: AC
  Administered 2018-01-09 – 2018-01-10 (×2): 0.25 mg via INTRAVENOUS
  Filled 2018-01-09 (×2): qty 1

## 2018-01-09 NOTE — Care Management (Signed)
01-09-18  BENEFITS CHECK :  #  1.   S/W MARY @  [facility]  # [phone]   ELIQUIS  5 MG BID COVER- YES CO-PAY- $ 47.00 TIER- 3 DRUG PRIOR APPROVAL- NO  PREFERRED PHARMACY : YES   [facility]

## 2018-01-09 NOTE — Progress Notes (Signed)
OT Cancellation Note  Patient Details Name: [name] MRN: [id number] DOB: [birth year]   Cancelled Treatment:    Reason Eval/Treat Not Completed: Patient at procedure or test/ unavailable (vascualar at this time)  Felecia Shelling, OTR/L  Acute Rehabilitation Services Pager: [phone] Office: [phone] .  01/09/2018, 10:39 AM

## 2018-01-09 NOTE — Progress Notes (Signed)
ANTICOAGULATION CONSULT NOTE   Pharmacy Consult for Heparin Indication: atrial fibrillation  Allergies  Allergen Reactions  . Codeine Hives  . Vancomycin Rash    Patient Measurements: Heparin Dosing Weight: 68.9 kg  Vital Signs: Temp: 97.1 F (36.2 C) (09/01 2300) Temp Source: Oral (09/01 2045) BP: 85/68 (09/02 0328) Pulse Rate: 122 (09/01 2258)  Labs: Recent Labs    01/08/18 0854 01/08/18 0901 01/08/18 1343 01/08/18 2056 01/09/18 0300  HGB 11.2* 14.3  --   --  10.6*  HCT 41.2 42.0  --   --  37.7  PLT 284  --   --   --  271  APTT 33  --   --   --  74*  LABPROT 14.0  --   --   --   --   INR 1.09  --   --   --   --   HEPARINUNFRC  --   --   --  0.36 0.15*  CREATININE 5.10* 5.30*  --   --   --   TROPONINI  --   --  <0.03  --   --     Estimated Creatinine Clearance: 7.9 mL/min (A) (by C-G formula based on SCr of 5.3 mg/dL (H)).   Medical History: Past Medical History:  Diagnosis Date  . Anemia   . C. difficile diarrhea   . Chronic kidney disease    on Dialysis  . Depression   . Diabetes mellitus   . Diabetic neuropathy (HCC)   . Gangrene (HCC)    right foot  . GERD (gastroesophageal reflux disease)   . Gout   . Hyperparathyroidism   . MRSA (methicillin resistant staph aureus) culture positive   . Obesity   . Osteoarthritis   . Peripheral arterial disease (HCC)    Gangrene-Left Great Toe  . Shortness of breath dyspnea    with exertion  . Stroke [facility])     Medications:  Plavix prior to admission - otherwise no anticoagulation   Assessment: 84 year old female with past medical history significant for stroke (2015), HTN, DM, ESRD-HD, R-BKA in July, and peripheral arterial disease who was brought to ED today from [facility] for R-sided gaze deviation and L-sided weakness x45 minutes then resolved. She was found to be in atrial fibrillation with RVR on exam and pharmacy consulted to start IV Heparin.   Head CT 9/1 >> no acute abnormality including  no evidence of acute infarct or bleeding. MRI 9/1 >> negative for acute infarct  H/H is within normal limits. Platelets are normal. INR 1.09 on admission. APTT 33. Troponin 0.03. No bleeding noted.  CHADs-VASC = 8.   9/2 AM update: heparin level low this AM, Hgb down some (watch)  Goal of Therapy:  Heparin level 0.3-0.7 units/ml Monitor platelets by anticoagulation protocol: Yes   Plan:  Heparin drip to 1100 units/hr Heparin level in 8 hours Trend Hgb  Abran Duke, PharmD, BCPS Clinical Pharmacist Phone: [phone]

## 2018-01-09 NOTE — Progress Notes (Signed)
EEG completed; results pending.    

## 2018-01-09 NOTE — Progress Notes (Addendum)
PROGRESS NOTE  [name] FAO:[id number] DOB: 1932/06/06 DOA: 01/08/2018 PCP: Mila Palmer, MD  HPI/Recap of past 24 hours: [name] is a 84 y.o. female with medical history significant for PAD, DM, stroke, OA, end-stage renal disease on dialysis Tuesday Thursday Saturday, secondary hyperparathyroidism, history of gout, GERD, depression, and history of C. difficile diarrhea.   Presents to the emergency department with slurring of speech and not able to turn her eyes and look at a certain direction.  Son came to visit her at the rehab place and felt that her speech was slurred and she was not moving her left side of the body.  This lasted for about 45 minutes then resolved.   On presentation to the ED patient is in A. fib with RVR.  Started on metoprolol for new diagnosis A. Fib.  01/09/2018: Patient seen and examined at her bedside.  She denies palpitations, chest pain, dyspnea.  Patient remains in A. fib RVR and blood pressure is soft.  On heparin drip.  Cardiology consulted.  Assessment/Plan: Principal Problem:   TIA (transient ischemic attack) Active Problems:   Pressure injury of skin  TIA Neurological symptoms have resolved Continue Plavix  New onset/newly diagnosed A. fib with RVR 2D echo results pending Continues to be in RVR Blood pressure is soft Continue heparin drip Case manager consult to verify insurance coverage of Eliquis May consider switch heparin drip to Eliquis if okay with cardiology Cardiology consulted Continue to monitor heart rate on telemetry  Hypotension in the setting of A. fib RVR and end-stage renal disease on dialysis Tuesday Thursday Saturday Once heart rate is controlled if hypotension persists may consider Midodrine Continue to closely monitor vital signs Maintain map above 65  End-stage renal disease on dialysis Tuesday Thursday Saturday Nephrology consulted and following  Anemia of chronic disease secondary to end-stage renal  disease Hemoglobin stable at 10 No sign of overt bleeding  Severe peripheral vascular disease status post right below the knee amputation Fall precautions Continue Plavix   Code Status: Full code  Family Communication: None at bedside  Disposition Plan: SNF when clinically stable and cardiology, nephrology sign off.   Consultants:  Cardiology  Neurology  Nephrology  Procedures:   Antimicrobials:  None  DVT prophylaxis: Heparin drip   Objective: Vitals:   01/09/18 0428 01/09/18 0600 01/09/18 0628 01/09/18 0805  BP: (!) 88/60 (!) 89/53 96/75 (!) 76/52  Pulse:    (!) 136  Resp: 19 (!) 25 18 18   Temp: 97.7 F (36.5 C)   97.9 F (36.6 C)  TempSrc: Oral   Oral  SpO2: 94%   96%    Intake/Output Summary (Last 24 hours) at 01/09/2018 1059 Last data filed at 01/09/2018 0800 Gross per 24 hour  Intake 1283.66 ml  Output 0 ml  Net 1283.66 ml   There were no vitals filed for this visit.  Exam:  . General: 84 y.o. year-old female well developed well nourished in no acute distress.  Alert and oriented x3. . Cardiovascular: Irregular rate and rhythm with no rubs or gallops.  No thyromegaly or JVD noted.   Marland Kitchen Respiratory: Clear to auscultation with no wheezes or rales. Good inspiratory effort. . Abdomen: Soft nontender nondistended with normal bowel sounds x4 quadrants. . Musculoskeletal: Right below the knee amputation. Marland Kitchen Psychiatry: Mood is appropriate for condition and setting   Data Reviewed: CBC: Recent Labs  Lab 01/08/18 0854 01/08/18 0901 01/09/18 0300  WBC 10.2  --  10.9*  NEUTROABS  8.0*  --   --   HGB 11.2* 14.3 10.6*  HCT 41.2 42.0 37.7  MCV 83.9  --  81.3  PLT 284  --  271   Basic Metabolic Panel: Recent Labs  Lab 01/08/18 0854 01/08/18 0901 01/09/18 0300  NA 139 140 137  K 3.7 3.5 4.3  CL 102 101 99  CO2 26  --  27  GLUCOSE 181* 185* 131*  BUN 35* 36* 46*  CREATININE 5.10* 5.30* 6.20*  CALCIUM 9.0  --  8.5*   GFR: Estimated  Creatinine Clearance: 6.8 mL/min (A) (by C-G formula based on SCr of 6.2 mg/dL (H)). Liver Function Tests: Recent Labs  Lab 01/08/18 0854 01/09/18 0300  AST 18 16  ALT 14 13  ALKPHOS 110 88  BILITOT 0.7 0.8  PROT 7.2 6.2*  ALBUMIN 3.1* 2.8*   No results for input(s): LIPASE, AMYLASE in the last 168 hours. No results for input(s): AMMONIA in the last 168 hours. Coagulation Profile: Recent Labs  Lab 01/08/18 0854  INR 1.09   Cardiac Enzymes: Recent Labs  Lab 01/08/18 1343  TROPONINI <0.03   BNP (last 3 results) No results for input(s): PROBNP in the last 8760 hours. HbA1C: Recent Labs    01/09/18 0300  HGBA1C 5.5   CBG: Recent Labs  Lab 01/08/18 2157 01/09/18 0757  GLUCAP 120* 125*   Lipid Profile: Recent Labs    01/09/18 0300  CHOL 77  HDL 46  LDLCALC 22  TRIG 44  CHOLHDL 1.7   Thyroid Function Tests: No results for input(s): TSH, T4TOTAL, FREET4, T3FREE, THYROIDAB in the last 72 hours. Anemia Panel: No results for input(s): VITAMINB12, FOLATE, FERRITIN, TIBC, IRON, RETICCTPCT in the last 72 hours. Urine analysis:    Component Value Date/Time   COLORURINE YELLOW 12/17/2006 0005   APPEARANCEUR TURBID (A) 12/17/2006 0005   LABSPEC 1.019 12/17/2006 0005   PHURINE 5.0 12/17/2006 0005   GLUCOSEU NEGATIVE 12/17/2006 0005   HGBUR MODERATE (A) 12/17/2006 0005   BILIRUBINUR SMALL (A) 12/17/2006 0005   KETONESUR 15 (A) 12/17/2006 0005   PROTEINUR NEGATIVE 12/17/2006 0005   UROBILINOGEN 0.2 12/17/2006 0005   NITRITE NEGATIVE 12/17/2006 0005   LEUKOCYTESUR LARGE (A) 12/17/2006 0005   Sepsis Labs: @LABRCNTIP (procalcitonin:4,lacticidven:4)  ) Recent Results (from the past 240 hour(s))  MRSA PCR Screening     Status: None   Collection Time: 01/08/18 12:19 PM  Result Value Ref Range Status   MRSA by PCR NEGATIVE NEGATIVE Final    Comment:        The GeneXpert MRSA Assay (FDA approved for NASAL specimens only), is one component of a comprehensive  MRSA colonization surveillance program. It is not intended to diagnose MRSA infection nor to guide or monitor treatment for MRSA infections. Performed at [facility], [address], [place], [place] [address]       Studies: Mr Shirlee Latch UE Contrast  Result Date: 01/08/2018 CLINICAL DATA:  TIA.  Diabetes EXAM: MRI HEAD WITHOUT CONTRAST MRA HEAD WITHOUT CONTRAST MRA NECK WITHOUT CONTRAST TECHNIQUE: Multiplanar, multiecho pulse sequences of the brain and surrounding structures were obtained without intravenous contrast. Angiographic images of the Circle of Willis were obtained using MRA technique without intravenous contrast. Angiographic images of the neck were obtained using MRA technique without intravenous contrast. Carotid stenosis measurements (when applicable) are obtained utilizing NASCET criteria, using the distal internal carotid diameter as the denominator. COMPARISON:  CT head 01/08/2018 FINDINGS: MRI HEAD FINDINGS Brain: Negative for acute infarct.  Moderate atrophy without hydrocephalus. Moderate chronic microvascular ischemic change throughout the white matter extending into the pons bilaterally and thalamus bilaterally. Chronic infarcts in the right brachium pontis and right cerebellum. Negative for hemorrhage or mass. Vascular: Normal arterial flow voids Skull and upper cervical spine: Diffuse cervical spondylosis. Extensive arthropathy C1-2 with prominent pannus posterior to the dens causing moderate spinal stenosis and cord flattening on the right. Question rheumatoid arthritis versus CPPD or osteoarthritis. Sinuses/Orbits: Paranasal sinuses clear.  Bilateral cataract surgery Other: None MRA HEAD FINDINGS Both vertebral artery patent to the basilar. Basilar widely patent. Posterior cerebral arteries are patent bilaterally with mild atherosclerotic disease distally bilaterally. Internal carotid artery patent bilaterally with moderate stenosis in the cavernous carotid bilaterally  right greater than left. Anterior and middle cerebral arteries patent bilaterally. Moderate stenosis M2 segments bilaterally. Anterior cerebral arteries patent bilaterally. Negative for aneurysm. MRA NECK FINDINGS Suboptimal image quality due to motion and lack of intravenous contrast. Antegrade flow in carotid and vertebral arteries bilaterally. Proximal vertebral arteries are not imaged on the study related to artifact. Mid and distal vertebral arteries widely patent Internal carotid artery patent bilaterally. No high-grade stenosis. Limited detail through the carotid bifurcation. IMPRESSION: 1. Negative for acute infarct. Generalized atrophy with extensive chronic ischemic changes 2. Cervical spondylosis. Extensive C1-2 arthropathy with prominent pannus formation posterior to the dens causing spinal stenosis. Question rheumatoid arthritis 3. Mild to moderate intracranial atherosclerotic disease. No large vessel occlusion 4. Suboptimal MRA neck due to motion and lack of contrast. No hemodynamically significant stenosis in the carotid or vertebral arteries as described above. Electronically Signed   By: Marlan Palau M.D.   On: 01/08/2018 12:58   Mr Maxine Glenn Neck Wo Contrast  Result Date: 01/08/2018 CLINICAL DATA:  TIA.  Diabetes EXAM: MRI HEAD WITHOUT CONTRAST MRA HEAD WITHOUT CONTRAST MRA NECK WITHOUT CONTRAST TECHNIQUE: Multiplanar, multiecho pulse sequences of the brain and surrounding structures were obtained without intravenous contrast. Angiographic images of the Circle of Willis were obtained using MRA technique without intravenous contrast. Angiographic images of the neck were obtained using MRA technique without intravenous contrast. Carotid stenosis measurements (when applicable) are obtained utilizing NASCET criteria, using the distal internal carotid diameter as the denominator. COMPARISON:  CT head 01/08/2018 FINDINGS: MRI HEAD FINDINGS Brain: Negative for acute infarct. Moderate atrophy without  hydrocephalus. Moderate chronic microvascular ischemic change throughout the white matter extending into the pons bilaterally and thalamus bilaterally. Chronic infarcts in the right brachium pontis and right cerebellum. Negative for hemorrhage or mass. Vascular: Normal arterial flow voids Skull and upper cervical spine: Diffuse cervical spondylosis. Extensive arthropathy C1-2 with prominent pannus posterior to the dens causing moderate spinal stenosis and cord flattening on the right. Question rheumatoid arthritis versus CPPD or osteoarthritis. Sinuses/Orbits: Paranasal sinuses clear.  Bilateral cataract surgery Other: None MRA HEAD FINDINGS Both vertebral artery patent to the basilar. Basilar widely patent. Posterior cerebral arteries are patent bilaterally with mild atherosclerotic disease distally bilaterally. Internal carotid artery patent bilaterally with moderate stenosis in the cavernous carotid bilaterally right greater than left. Anterior and middle cerebral arteries patent bilaterally. Moderate stenosis M2 segments bilaterally. Anterior cerebral arteries patent bilaterally. Negative for aneurysm. MRA NECK FINDINGS Suboptimal image quality due to motion and lack of intravenous contrast. Antegrade flow in carotid and vertebral arteries bilaterally. Proximal vertebral arteries are not imaged on the study related to artifact. Mid and distal vertebral arteries widely patent Internal carotid artery patent bilaterally. No high-grade stenosis. Limited detail through the carotid bifurcation. IMPRESSION: 1. Negative  for acute infarct. Generalized atrophy with extensive chronic ischemic changes 2. Cervical spondylosis. Extensive C1-2 arthropathy with prominent pannus formation posterior to the dens causing spinal stenosis. Question rheumatoid arthritis 3. Mild to moderate intracranial atherosclerotic disease. No large vessel occlusion 4. Suboptimal MRA neck due to motion and lack of contrast. No hemodynamically  significant stenosis in the carotid or vertebral arteries as described above. Electronically Signed   By: Marlan Palau M.D.   On: 01/08/2018 12:58   Mr Brain Wo Contrast  Result Date: 01/08/2018 CLINICAL DATA:  TIA.  Diabetes EXAM: MRI HEAD WITHOUT CONTRAST MRA HEAD WITHOUT CONTRAST MRA NECK WITHOUT CONTRAST TECHNIQUE: Multiplanar, multiecho pulse sequences of the brain and surrounding structures were obtained without intravenous contrast. Angiographic images of the Circle of Willis were obtained using MRA technique without intravenous contrast. Angiographic images of the neck were obtained using MRA technique without intravenous contrast. Carotid stenosis measurements (when applicable) are obtained utilizing NASCET criteria, using the distal internal carotid diameter as the denominator. COMPARISON:  CT head 01/08/2018 FINDINGS: MRI HEAD FINDINGS Brain: Negative for acute infarct. Moderate atrophy without hydrocephalus. Moderate chronic microvascular ischemic change throughout the white matter extending into the pons bilaterally and thalamus bilaterally. Chronic infarcts in the right brachium pontis and right cerebellum. Negative for hemorrhage or mass. Vascular: Normal arterial flow voids Skull and upper cervical spine: Diffuse cervical spondylosis. Extensive arthropathy C1-2 with prominent pannus posterior to the dens causing moderate spinal stenosis and cord flattening on the right. Question rheumatoid arthritis versus CPPD or osteoarthritis. Sinuses/Orbits: Paranasal sinuses clear.  Bilateral cataract surgery Other: None MRA HEAD FINDINGS Both vertebral artery patent to the basilar. Basilar widely patent. Posterior cerebral arteries are patent bilaterally with mild atherosclerotic disease distally bilaterally. Internal carotid artery patent bilaterally with moderate stenosis in the cavernous carotid bilaterally right greater than left. Anterior and middle cerebral arteries patent bilaterally. Moderate  stenosis M2 segments bilaterally. Anterior cerebral arteries patent bilaterally. Negative for aneurysm. MRA NECK FINDINGS Suboptimal image quality due to motion and lack of intravenous contrast. Antegrade flow in carotid and vertebral arteries bilaterally. Proximal vertebral arteries are not imaged on the study related to artifact. Mid and distal vertebral arteries widely patent Internal carotid artery patent bilaterally. No high-grade stenosis. Limited detail through the carotid bifurcation. IMPRESSION: 1. Negative for acute infarct. Generalized atrophy with extensive chronic ischemic changes 2. Cervical spondylosis. Extensive C1-2 arthropathy with prominent pannus formation posterior to the dens causing spinal stenosis. Question rheumatoid arthritis 3. Mild to moderate intracranial atherosclerotic disease. No large vessel occlusion 4. Suboptimal MRA neck due to motion and lack of contrast. No hemodynamically significant stenosis in the carotid or vertebral arteries as described above. Electronically Signed   By: Marlan Palau M.D.   On: 01/08/2018 12:58    Scheduled Meds: .  stroke: mapping our early stages of recovery book   Does not apply Once  . cinacalcet  30 mg Oral QPM  . clopidogrel  75 mg Oral Daily  . feeding supplement (NEPRO CARB STEADY)  237 mL Oral Daily  . feeding supplement (PRO-STAT SUGAR FREE 64)  30 mL Oral Daily  . gabapentin  100 mg Oral QHS  . insulin aspart  0-9 Units Subcutaneous TID WC  . metoprolol tartrate  12.5 mg Oral BID  . multivitamin  1 tablet Oral QHS  . sevelamer carbonate  1,600 mg Oral With snacks  . sevelamer carbonate  2,400 mg Oral TID WC    Continuous Infusions: . sodium chloride 50 mL/hr at  01/08/18 1311  . heparin 1,100 Units/hr (01/09/18 0901)     LOS: 1 day     Darlin Drop, MD [facility] Pager [phone]  If 7PM-7AM, please contact night-coverage [email or web address] Password [email or web address] 01/09/2018, 10:59 AM

## 2018-01-09 NOTE — Discharge Instructions (Signed)

## 2018-01-09 NOTE — Progress Notes (Signed)
[facility] Progress Note  Subjective:   Vitals:   01/09/18 0628 01/09/18 0805 01/09/18 1203 01/09/18 1610  BP: 96/75 (!) 76/52 (!) 99/52 93/65  Pulse:  (!) 136 (!) 137 (!) 115  Resp: 18 18 16  (!) 22  Temp:  97.9 F (36.6 C) 98.1 F (36.7 C)   TempSrc:  Oral Axillary   SpO2:  96% 97% 96%    Inpatient medications: .  stroke: mapping our early stages of recovery book   Does not apply Once  . apixaban  2.5 mg Oral BID  . cinacalcet  30 mg Oral QPM  . clopidogrel  75 mg Oral Daily  . digoxin  0.25 mg Intravenous Daily  . feeding supplement (NEPRO CARB STEADY)  237 mL Oral Daily  . feeding supplement (PRO-STAT SUGAR FREE 64)  30 mL Oral Daily  . gabapentin  100 mg Oral QHS  . insulin aspart  0-9 Units Subcutaneous TID WC  . metoprolol tartrate  12.5 mg Oral BID  . multivitamin  1 tablet Oral QHS  . sevelamer carbonate  1,600 mg Oral With snacks  . sevelamer carbonate  2,400 mg Oral TID WC   . sodium chloride 50 mL/hr at 01/08/18 1311   oxyCODONE-acetaminophen, senna-docusate  Iron/TIBC/Ferritin/ %Sat    Component Value Date/Time   IRON 26 (L) 12/19/2006 1455   TIBC 86 (L) 12/19/2006 1455   FERRITIN 1076 (H) 12/19/2006 1455   IRONPCTSAT 30 12/19/2006 1455   IRONPCTSAT 16 (L) 10/18/2005 0907    Exam:  very obese, somnolent but arouses easily and converses  no jvd  chest cta bilat  cor reg no mrg  abd soft ntnd no mass or ascites  ext R BKA stump wrapped, L foot bandaged, no edema  skin no rash  nonfocal, O x3    R IJ TDC  Dialysis: [facility]  4h  78kg  2/2.25 bath   Hep 5000  RIJ TDC ("Dr Imogene Burn felt no other Access available ")  - Mircera 150 mcg q 2wks (given 12/29/17  last op hgb 10.6 (01/05/18)   Impression: 1. ESRD -  HD on TTS.  HD tomorrow.  2. A fib with RVR - RX  per admit team, Iv Hep / Metoprolol  3. TIA  -wu with Neuro consulting 4. HO CVA in past  5. Hypertension/volume  -  under dry wt, bp's soft, no vol^ on exam 6. Anemia  -  HGB 11.2 no esa needs this week ( Mircera given 12/29/17)  Fu hgb trend  7. Metabolic bone disease - Renvela binder , Sensipar  q day , no  Er Ca ok , no  vit d on hd  8. Nutrition - Renal /Carb  Diet / renal vit  9. HO  Recent R BKA / L foot diabetic wound - wound care per admit  10. HO DM type 2 - per admit   Plan - HD tomorrow, no fluid off   Vinson Moselle MD [facility] pager [phone]   01/09/2018, 5:33 PM   Recent Labs  Lab 01/08/18 0854 01/08/18 0901 01/09/18 0300  NA 139 140 137  K 3.7 3.5 4.3  CL 102 101 99  CO2 26  --  27  GLUCOSE 181* 185* 131*  BUN 35* 36* 46*  CREATININE 5.10* 5.30* 6.20*  CALCIUM 9.0  --  8.5*  ALBUMIN 3.1*  --  2.8*  INR 1.09  --   --    Recent Labs  Lab 01/08/18  0854 01/09/18 0300  AST 18 16  ALT 14 13  ALKPHOS 110 88  BILITOT 0.7 0.8  PROT 7.2 6.2*   Recent Labs  Lab 01/08/18 0854 01/08/18 0901 01/09/18 0300  WBC 10.2  --  10.9*  NEUTROABS 8.0*  --   --   HGB 11.2* 14.3 10.6*  HCT 41.2 42.0 37.7  MCV 83.9  --  81.3  PLT 284  --  271

## 2018-01-09 NOTE — Progress Notes (Signed)
VASCULAR LAB PRELIMINARY  PRELIMINARY  PRELIMINARY  PRELIMINARY  Carotid duplex completed.    Preliminary report:  1-39% ICA plaquing. Vertebral artery flow is antegrade.   Lekita Kerekes, RVT 01/09/2018, 9:41 AM

## 2018-01-09 NOTE — Procedures (Signed)
History: 84 yo F with TIA vs sz  Sedation: none  Technique: This is a 21 channel routine scalp EEG performed at the bedside with bipolar and monopolar montages arranged in accordance to the international 10/20 system of electrode placement. One channel was dedicated to EKG recording.    Background: The background consists of intermixed alpha and beta activities. There is a well defined posterior dominant rhythm of 8-9 Hz that attenuates with eye opening. There is an increase in delta with drwosiness and sleep is recorded with normal appearing structures.   Photic stimulation: Physiologic driving is not performed  EEG Abnormalities: None  Clinical Interpretation: This normal EEG is recorded in the waking and sleep state. There was no seizure or seizure predisposition recorded on this study. Please note that a normal EEG does not preclude the possibility of epilepsy.   Ritta Slot, MD [facility] [phone]  If 7pm- 7am, please page neurology on call as listed in AMION.

## 2018-01-09 NOTE — CV Procedure (Signed)
Attempted 2D Echo, patient's heart rate too high, will try again at a later time.  01/09/18 Sinda Du RDCS, RVT

## 2018-01-09 NOTE — Evaluation (Signed)
Occupational Therapy Evaluation Patient Details Name: [name] MRN: [id number] DOB: December 25, 1932 Today's Date: 01/09/2018    History of Present Illness 84 yo female admitted from [facility] with afib with RVR and TIA. MRI (+) L lateral Lateral Thalamus infarct remote infarcts in both thalamus and R cerebellum and brachium pontis L heel wound .  PMH: anemia, ESRD with hemodialysis PVD with R BKA, DM2, obese, L side weakness    Clinical Impression   PT admitted with L lateral thalamus infarct . Pt currently with functional limitiations due to the deficits listed below (see OT problem list). Please have MD or wound care address R BKA shrinker. The band of the shrinker puts pressure on thigh wound. Recommend a small sock shrinker to decr risk for further break down especially behind the knee from the fabric rolled down.  Pt will benefit from skilled OT to increase their independence and safety with adls and balance to allow discharge SNF.     Follow Up Recommendations  SNF    Equipment Recommendations  Hospital bed;Wheelchair cushion (measurements OT);Wheelchair (measurements OT);3 in 1 bedside commode;Other (comment)(hoyer)    Recommendations for Other Services       Precautions / Restrictions Precautions Precautions: Fall Precaution Comments: L heel wound / R BKA with sock shrinker Restrictions Weight Bearing Restrictions: Yes LLE Weight Bearing: Non weight bearing      Mobility Bed Mobility Overal bed mobility: Needs Assistance Bed Mobility: Rolling;Supine to Sit Rolling: Min assist   Supine to sit: Mod assist     General bed mobility comments: Pt exiting the bed on the right side. pt reaching with L UE for bedrail. pt requires (A) to elevate trunk from bed surface  Transfers Overall transfer level: Needs assistance               General transfer comment: pt with lift pad to move to the chair. total +2 (A) total due to L LE NWB    Balance                                            ADL either performed or assessed with clinical judgement   ADL Overall ADL's : Needs assistance/impaired Eating/Feeding: Sitting;Modified independent Eating/Feeding Details (indicate cue type and reason): pt eating lunch in the chair  Grooming: Modified independent;Sitting   Upper Body Bathing: Minimal assistance   Lower Body Bathing: Maximal assistance   Upper Body Dressing : Minimal assistance   Lower Body Dressing: Maximal assistance Lower Body Dressing Details (indicate cue type and reason): pt with R BKA shrinker in place but with poor fit. Recommending MD or wound care address due to possible skin break down   Toilet Transfer Details (indicate cue type and reason): using bed pan at baseline at SNF           General ADL Comments: pt hoyer lifted to chair this session due to BP decr and HR sustain 150s      Vision         Perception     Praxis      Pertinent Vitals/Pain Pain Assessment: Faces Faces Pain Scale: Hurts a little bit Pain Location: L heel Pain Descriptors / Indicators: Grimacing Pain Intervention(s): Monitored during session;Premedicated before session;Repositioned     Hand Dominance Right   Extremity/Trunk Assessment Upper Extremity Assessment Upper Extremity Assessment: Overall WFL for tasks assessed  Lower Extremity Assessment Lower Extremity Assessment: Defer to PT evaluation   Cervical / Trunk Assessment Cervical / Trunk Assessment: Normal   Communication Communication Communication: No difficulties   Cognition Arousal/Alertness: Awake/alert Behavior During Therapy: WFL for tasks assessed/performed Overall Cognitive Status: Within Functional Limits for tasks assessed                                     General Comments  wound on R BKA and wound on L heel     Exercises     Shoulder Instructions      Home Living Family/patient expects to be discharged to:: Skilled  nursing facility   Available Help at Discharge: Family;Available 24 hours/day Type of Home: House                           Additional Comments: from [facility] SNF      Prior Functioning/Environment Level of Independence: Needs assistance  Gait / Transfers Assistance Needed: sliding board with wheelchair              OT Problem List: Decreased strength;Decreased range of motion;Decreased activity tolerance;Impaired balance (sitting and/or standing);Decreased safety awareness;Decreased knowledge of use of DME or AE;Decreased knowledge of precautions;Obesity;Pain;Cardiopulmonary status limiting activity;Decreased cognition      OT Treatment/Interventions: Self-care/ADL training;Therapeutic exercise;Neuromuscular education;Energy conservation;DME and/or AE instruction;Manual therapy;Therapeutic activities;Cognitive remediation/compensation;Patient/family education;Balance training    OT Goals(Current goals can be found in the care plan section) Acute Rehab OT Goals Patient Stated Goal: none stated OT Goal Formulation: With patient Time For Goal Achievement: 01/09/18 Potential to Achieve Goals: Good  OT Frequency: Min 2X/week   Barriers to D/C:            Co-evaluation PT/OT/SLP Co-Evaluation/Treatment: Yes Reason for Co-Treatment: Complexity of the patient's impairments (multi-system involvement);For patient/therapist safety;To address functional/ADL transfers   OT goals addressed during session: ADL's and self-care;Proper use of Adaptive equipment and DME;Strengthening/ROM      AM-PAC PT "6 Clicks" Daily Activity     Outcome Measure Help from another person eating meals?: None Help from another person taking care of personal grooming?: A Little Help from another person toileting, which includes using toliet, bedpan, or urinal?: A Lot Help from another person bathing (including washing, rinsing, drying)?: A Lot Help from another person to put on and taking off  regular upper body clothing?: A Little Help from another person to put on and taking off regular lower body clothing?: Total 6 Click Score: 15   End of Session Nurse Communication: Mobility status;Precautions;Need for lift equipment  Activity Tolerance: Patient tolerated treatment well Patient left: in chair;with call bell/phone within reach;with chair alarm set  OT Visit Diagnosis: Unsteadiness on feet (R26.81);Muscle weakness (generalized) (M62.81)                Time: 6203-5597 OT Time Calculation (min): 34 min Charges:  OT General Charges $OT Visit: 1 Visit OT Evaluation $OT Eval Moderate Complexity: 1 Mod   Mateo Flow, OTR/L  Acute Rehabilitation Services Pager: [phone] Office: [phone] .   Boone Master B 01/09/2018, 1:55 PM

## 2018-01-09 NOTE — Evaluation (Signed)
Speech Language Pathology Evaluation Patient Details Name: [name] MRN: [id number] DOB: Mar 14, 1933 Today's Date: 01/09/2018 Time: 0263-7858 SLP Time Calculation (min) (ACUTE ONLY): 20 min  Problem List:  Patient Active Problem List   Diagnosis Date Noted  . Pressure injury of skin 01/09/2018  . TIA (transient ischemic attack) 01/08/2018  . Unilateral complete BKA, right, initial encounter (HCC)   . Diabetes mellitus type 2 in obese (HCC)   . History of CVA (cerebrovascular accident)   . Post-operative pain   . Acute blood loss anemia   . Anemia of chronic disease   . Benign essential HTN   . Morbid obesity (HCC)   . Below knee amputation status, right (HCC) 11/11/2017  . Gangrene of right foot (HCC)   . PAD (peripheral artery disease) (HCC) 04/29/2015  . Atherosclerosis of native arteries of the extremities with ulceration (HCC) 01/10/2014  . CVA (cerebral infarction) 12/14/2013  . Dysphagia, unspecified(787.20) 11/28/2013  . Diabetes mellitus with peripheral vascular disease (HCC) 10/31/2012  . Depression 10/31/2012  . Anemia 10/31/2012  . History of Clostridium difficile colitis 10/31/2012  . Chronic diarrhea 10/31/2012  . ESRD on dialysis [facility]) 10/06/2012   Past Medical History:  Past Medical History:  Diagnosis Date  . Anemia   . C. difficile diarrhea   . Chronic kidney disease    on Dialysis  . Depression   . Diabetes mellitus   . Diabetic neuropathy (HCC)   . Gangrene (HCC)    right foot  . GERD (gastroesophageal reflux disease)   . Gout   . Hyperparathyroidism   . MRSA (methicillin resistant staph aureus) culture positive   . Obesity   . Osteoarthritis   . Peripheral arterial disease (HCC)    Gangrene-Left Great Toe  . Shortness of breath dyspnea    with exertion  . Stroke [facility])    Past Surgical History:  Past Surgical History:  Procedure Laterality Date  . ABDOMINAL AORTOGRAM N/A 07/05/2016   Procedure: Abdominal Aortogram;  Surgeon: Maeola Harman, MD;  Location: [facility] INVASIVE CV LAB;  Service: Cardiovascular;  Laterality: N/A;  . ABDOMINAL AORTOGRAM N/A 06/24/2017   Procedure: ABDOMINAL AORTOGRAM;  Surgeon: Sherren Kerns, MD;  Location: [facility] INVASIVE CV LAB;  Service: Cardiovascular;  Laterality: N/A;  . ABDOMINAL HYSTERECTOMY    . AMPUTATION Right 11/11/2017   Procedure: RIGHT BELOW KNEE AMPUTATION;  Surgeon: Nadara Mustard, MD;  Location: [facility] OR;  Service: Orthopedics;  Laterality: Right;  . AV FISTULA PLACEMENT Left 07/07/2015   Procedure: ATTEMPTED INSERTION OFLEFT  ARTERIOVENOUS (AV) GORE-TEX GRAFT THIGH;  Surgeon: Fransisco Hertz, MD;  Location: [facility] OR;  Service: Vascular;  Laterality: Left;  . BELOW KNEE LEG AMPUTATION Right 11/11/2017  . BREAST EXCISIONAL BIOPSY Right 05/25/2006  . COLONOSCOPY    . LOWER EXTREMITY ANGIOGRAPHY Bilateral 07/05/2016   Procedure: Lower Extremity Angiography;  Surgeon: Maeola Harman, MD;  Location: [facility] INVASIVE CV LAB;  Service: Cardiovascular;  Laterality: Bilateral;  . LOWER EXTREMITY ANGIOGRAPHY Bilateral 06/24/2017   Procedure: Lower Extremity Angiography;  Surgeon: Sherren Kerns, MD;  Location: [facility] INVASIVE CV LAB;  Service: Cardiovascular;  Laterality: Bilateral;  . MULTIPLE TOOTH EXTRACTIONS    . PERIPHERAL VASCULAR INTERVENTION Right 06/24/2017   Procedure: PERIPHERAL VASCULAR INTERVENTION;  Surgeon: Sherren Kerns, MD;  Location: [facility] INVASIVE CV LAB;  Service: Cardiovascular;  Laterality: Right;  POPLITEALPTA SFA STENT  . SHUNTOGRAM Right 09/06/12   Thigh graft  . SP DECLOT AVGG Right Sep 14, 2012  Right thigh graft   HPI:  [name] is a 84 y.o. female with medical history significant for PAD, DM, stroke, OA, secondary hyperparathyroidism, history of gout, GERD, depression, and history of C. difficile diarrhea.    She presents to the emergency department with slurring of speech and not able to turn her eyes and look at a certain direction.  His son came to visit  her at the rehab place and he felt that her speech was slurred and she was not moving her left side of the body this lasted for about 45 minutes then resolved.  At the time of my examination the family member at the bedside thought that her speech was back to normal she is in short-term rehabilitation at [facility] living in rehabilitation for recent right BKA.She had some wound dehiscence at the stump after applying a prosthesis too early.  She has a couple other chronic wounds that are also being managed there is one in the left heel and another one in the right inner thigh.  They deny cough congestion fever denies vomiting or diarrhea denies dark stool or blood in the stool.  Denies head injury or loss consciousness.MRI was negative for acute infarct.   Assessment / Plan / Recommendation Clinical Impression  Patient presented to hospital with slurred speech and eye gaze to one directions. Her son provided all history at time of admission. Patient does not recall why she is here or having any of the above symptoms. Her speech is back to baseline at this time. Her auditory comprehension and cognition appear wfl for functional tasks assessed; pt Ox4, and able to express wants/needs without difficulty, as well as detailed information no concerns with safety/judgment as well.  Pt functioning at baseline for speech/verbal expression no speech therapy recommended at this time.     SLP Assessment  SLP Recommendation/Assessment: Patient does not need any further Speech Lanaguage Pathology Services    Follow Up Recommendations       Frequency and Duration           SLP Evaluation Cognition  Overall Cognitive Status: Within Functional Limits for tasks assessed Arousal/Alertness: Awake/alert Orientation Level: Oriented X4;Oriented to person;Oriented to place;Oriented to situation;Oriented to time Attention: Focused Focused Attention: Appears intact Memory: Appears intact Awareness: Appears  intact Problem Solving: Appears intact Safety/Judgment: Appears intact       Comprehension  Auditory Comprehension Overall Auditory Comprehension: Appears within functional limits for tasks assessed Yes/No Questions: Within Functional Limits Commands: Within Functional Limits Conversation: Complex Visual Recognition/Discrimination Discrimination: Within Function Limits Reading Comprehension Reading Status: Within funtional limits    Expression Expression Primary Mode of Expression: Verbal Verbal Expression Overall Verbal Expression: Appears within functional limits for tasks assessed Initiation: No impairment Repetition: No impairment Naming: No impairment Pragmatics: No impairment Written Expression Dominant Hand: Right   Oral / Motor  Oral Motor/Sensory Function Overall Oral Motor/Sensory Function: Within functional limits Motor Speech Overall Motor Speech: Appears within functional limits for tasks assessed Respiration: Within functional limits Phonation: Normal Resonance: Within functional limits Articulation: Within functional limitis Intelligibility: Intelligible Motor Planning: Witnin functional limits Motor Speech Errors: Not applicable   GO                   Lindalou Hose Lashawnta Burgert, MA, CCC-SLP 01/09/2018 11:07 AM

## 2018-01-09 NOTE — Progress Notes (Signed)
PT Cancellation Note  Patient Details Name: [name] MRN: [id number] DOB: [birth year]   Cancelled Treatment:    Reason Eval/Treat Not Completed: Patient at procedure or test/unavailable Vascular - Will follow-up as time allows.    Kipp Laurence, PT, DPT 01/09/18 10:03 AM Pager: [phone]

## 2018-01-09 NOTE — Evaluation (Signed)
Physical Therapy Evaluation Patient Details Name: [name] MRN: [id number] DOB: January 11, 1933 Today's Date: 01/09/2018   History of Present Illness  83 yo female admitted from [facility] with afib with RVR and TIA. MRI (+) L lateral Lateral Thalamus infarct remote infarcts in both thalamus and R cerebellum and brachium pontis L heel wound .  PMH: anemia, ESRD with hemodialysis PVD with R BKA, DM2, obese, L side weakness     Clinical Impression  Patient admitted with the above listed diagnosis. Patient reports that prior to admission she was a resident of [facility]. Patient noted to have wound at R residual limb and L heel - therefore maintained NWB status during session. Requires Total A+2 for OOB transfers with [facility] for patient/therapist safety. Will recommend SNF at discharge to continue to progress safe functional mobility.     Follow Up Recommendations SNF;Supervision/Assistance - 24 hour    Equipment Recommendations  Other (comment)(defer)    Recommendations for Other Services OT consult     Precautions / Restrictions Precautions Precautions: Fall Precaution Comments: L heel wound / R BKA with sock shrinker Restrictions Weight Bearing Restrictions: Yes LLE Weight Bearing: Non weight bearing      Mobility  Bed Mobility Overal bed mobility: Needs Assistance Bed Mobility: Rolling;Supine to Sit Rolling: Min assist   Supine to sit: Mod assist     General bed mobility comments: Pt exiting the bed on the right side. pt reaching with L UE for bedrail. pt requires (A) to elevate trunk from bed surface  Transfers Overall transfer level: Needs assistance               General transfer comment: pt with lift pad to move to the chair. total +2 (A) total due to L LE NWB  Ambulation/Gait                Stairs            Wheelchair Mobility    Modified Rankin (Stroke Patients Only)       Balance                                              Pertinent Vitals/Pain Pain Assessment: Faces Faces Pain Scale: Hurts a little bit Pain Location: L heel Pain Descriptors / Indicators: Grimacing Pain Intervention(s): Limited activity within patient's tolerance;Monitored during session;Repositioned    Home Living Family/patient expects to be discharged to:: Skilled nursing facility                 Additional Comments: from [facility]    Prior Function Level of Independence: Needs assistance   Gait / Transfers Assistance Needed: sliding board with wheelchair           Hand Dominance   Dominant Hand: Right    Extremity/Trunk Assessment   Upper Extremity Assessment Upper Extremity Assessment: Defer to OT evaluation    Lower Extremity Assessment Lower Extremity Assessment: Generalized weakness;RLE deficits/detail;LLE deficits/detail RLE Deficits / Details: R BKA - repositioned shrinker for better fit LLE Deficits / Details: L heel with wound -maintained NWB status     Cervical / Trunk Assessment Cervical / Trunk Assessment: Normal  Communication   Communication: No difficulties  Cognition Arousal/Alertness: Awake/alert Behavior During Therapy: WFL for tasks assessed/performed Overall Cognitive Status: Within Functional Limits for tasks assessed  General Comments General comments (skin integrity, edema, etc.): wound at R residual limb and L heel    Exercises     Assessment/Plan    PT Assessment Patient needs continued PT services  PT Problem List Decreased strength;Decreased activity tolerance;Decreased balance;Decreased mobility;Decreased coordination;Decreased knowledge of use of DME;Decreased knowledge of precautions       PT Treatment Interventions DME instruction;Gait training;Functional mobility training;Therapeutic activities;Therapeutic exercise;Balance training;Neuromuscular re-education;Patient/family education    PT  Goals (Current goals can be found in the Care Plan section)  Acute Rehab PT Goals Patient Stated Goal: none stated PT Goal Formulation: With patient Time For Goal Achievement: 01/23/18 Potential to Achieve Goals: Fair    Frequency Min 2X/week   Barriers to discharge        Co-evaluation PT/OT/SLP Co-Evaluation/Treatment: Yes Reason for Co-Treatment: Complexity of the patient's impairments (multi-system involvement);For patient/therapist safety;To address functional/ADL transfers PT goals addressed during session: Mobility/safety with mobility OT goals addressed during session: ADL's and self-care;Proper use of Adaptive equipment and DME;Strengthening/ROM       AM-PAC PT "6 Clicks" Daily Activity  Outcome Measure Difficulty turning over in bed (including adjusting bedclothes, sheets and blankets)?: Unable Difficulty moving from lying on back to sitting on the side of the bed? : Unable Difficulty sitting down on and standing up from a chair with arms (e.g., wheelchair, bedside commode, etc,.)?: Unable Help needed moving to and from a bed to chair (including a wheelchair)?: Total Help needed walking in hospital room?: Total Help needed climbing 3-5 steps with a railing? : Total 6 Click Score: 6    End of Session   Activity Tolerance: Patient tolerated treatment well Patient left: in chair;with call bell/phone within reach;with chair alarm set;with nursing/sitter in room Nurse Communication: Mobility status;Need for lift equipment PT Visit Diagnosis: Unsteadiness on feet (R26.81);Other abnormalities of gait and mobility (R26.89);Muscle weakness (generalized) (M62.81)    Time: 1610-9604 PT Time Calculation (min) (ACUTE ONLY): 34 min   Charges:   PT Evaluation $PT Eval Moderate Complexity: 1 Mod        Kipp Laurence, PT, DPT 01/09/18 3:02 PM Pager: [phone]

## 2018-01-09 NOTE — Consult Note (Signed)
Referring Physician:  SHAYLAH MCGHIE is an 84 y.o. female.                       Chief Complaint: Atrial fibrillation with RVR and TIA  HPI: 84 year old female with PMH of Anemia, ESRD with hemodialysis, PVD with Right BKA, type 2 DM with diabetic neuropathy, Secondary hyperparathyroidism, Obesity, Stroke and gout has atrial fibrillation with RVR and low voltage on EKG. Her left sided weakness has resolved. CT head and MRI are negative for acute infarct but positive for recent left lateral thalamus infarct and remote infarcts in both thalamus and right cerebellum and brachium pontis. Her blood pressure are low to use metoprolol or diltiazem for heart rate control.   Past Medical History:  Diagnosis Date  . Anemia   . C. difficile diarrhea   . Chronic kidney disease    on Dialysis  . Depression   . Diabetes mellitus   . Diabetic neuropathy ([place])   . Gangrene ([place])    right foot  . GERD (gastroesophageal reflux disease)   . Gout   . Hyperparathyroidism   . MRSA (methicillin resistant staph aureus) culture positive   . Obesity   . Osteoarthritis   . Peripheral arterial disease ([facility])    Gangrene-Left Great Toe  . Shortness of breath dyspnea    with exertion  . Stroke [facility])       Past Surgical History:  Procedure Laterality Date  . ABDOMINAL AORTOGRAM N/A 07/05/2016   Procedure: Abdominal Aortogram;  Surgeon: Waynetta Sandy, MD;  Location: [facility];  Service: Cardiovascular;  Laterality: N/A;  . ABDOMINAL AORTOGRAM N/A 06/24/2017   Procedure: ABDOMINAL AORTOGRAM;  Surgeon: Elam Dutch, MD;  Location: [facility];  Service: Cardiovascular;  Laterality: N/A;  . ABDOMINAL HYSTERECTOMY    . AMPUTATION Right 11/11/2017   Procedure: RIGHT BELOW KNEE AMPUTATION;  Surgeon: Newt Minion, MD;  Location: [place];  Service: Orthopedics;  Laterality: Right;  . AV FISTULA PLACEMENT Left 07/07/2015   Procedure: ATTEMPTED INSERTION OFLEFT  ARTERIOVENOUS (AV) GORE-TEX  GRAFT THIGH;  Surgeon: Conrad San Antonito, MD;  Location: [place];  Service: Vascular;  Laterality: Left;  . BELOW KNEE LEG AMPUTATION Right 11/11/2017  . BREAST EXCISIONAL BIOPSY Right 05/25/2006  . COLONOSCOPY    . LOWER EXTREMITY ANGIOGRAPHY Bilateral 07/05/2016   Procedure: Lower Extremity Angiography;  Surgeon: Waynetta Sandy, MD;  Location: [facility];  Service: Cardiovascular;  Laterality: Bilateral;  . LOWER EXTREMITY ANGIOGRAPHY Bilateral 06/24/2017   Procedure: Lower Extremity Angiography;  Surgeon: Elam Dutch, MD;  Location: [facility];  Service: Cardiovascular;  Laterality: Bilateral;  . MULTIPLE TOOTH EXTRACTIONS    . PERIPHERAL VASCULAR INTERVENTION Right 06/24/2017   Procedure: PERIPHERAL VASCULAR INTERVENTION;  Surgeon: Elam Dutch, MD;  Location: [facility];  Service: Cardiovascular;  Laterality: Right;  POPLITEALPTA SFA STENT  . SHUNTOGRAM Right 09/06/12   Thigh graft  . SP DECLOT AVGG Right Sep 14, 2012   Right thigh graft    Family History  Problem Relation Age of Onset  . Cancer Father        type unknown  . Stroke Mother   . Diabetes Sister   . Cancer Brother        type unknown   Social History:  reports that she has quit smoking. Her smoking use included cigarettes. She has never used smokeless tobacco. She reports  that she does not drink alcohol or use drugs.  Allergies:  Allergies  Allergen Reactions  . Codeine Hives  . Vancomycin Rash    Facility-Administered Medications Prior to Admission  Medication Dose Route Frequency Provider Last Rate Last Dose  . protamine injection 25 mg  25 mg Intravenous Once Elam Dutch, MD       Medications Prior to Admission  Medication Sig Dispense Refill  . Amino Acids-Protein Hydrolys (FEEDING SUPPLEMENT, PRO-STAT SUGAR FREE 64,) LIQD Take 30 mLs by mouth daily.     . clopidogrel (PLAVIX) 75 MG tablet Take 1 tablet (75 mg total) by mouth daily. 30 tablet 11  . gabapentin  (NEURONTIN) 100 MG capsule Take 100 mg by mouth at bedtime.     . Nutritional Supplements (FEEDING SUPPLEMENT, NEPRO CARB STEADY,) LIQD Take 237 mLs by mouth daily.    Marland Kitchen oxyCODONE-acetaminophen (PERCOCET/ROXICET) 5-325 MG tablet Take 1 tablet by mouth every 4 (four) hours as needed. 30 tablet 0  . RENVELA 800 MG tablet Take 1,600-2,400 mg by mouth See admin instructions. Take 2400 mg by mouth 3 times daily with meals and take 1600 mg by mouth with snacks    . SENSIPAR 30 MG tablet Take 30 mg by mouth every evening.       Results for orders placed or performed during the hospital encounter of 01/08/18 (from the past 48 hour(s))  Ethanol     Status: None   Collection Time: 01/08/18  8:54 AM  Result Value Ref Range   Alcohol, Ethyl (B) <10 <10 mg/dL    Comment: (NOTE) Lowest detectable limit for serum alcohol is 10 mg/dL. For medical purposes only. Performed at [facility], [place] [address], [place], [place] [address]   Protime-INR     Status: None   Collection Time: 01/08/18  8:54 AM  Result Value Ref Range   Prothrombin Time 14.0 11.4 - 15.2 seconds   INR 1.09     Comment: Performed at [facility] [address] [place], [place] [address]  APTT     Status: None   Collection Time: 01/08/18  8:54 AM  Result Value Ref Range   aPTT 33 24 - 36 seconds    Comment: Performed at [facility] [address] [place], [place] [address]  CBC     Status: Abnormal   Collection Time: 01/08/18  8:54 AM  Result Value Ref Range   WBC 10.2 4.0 - 10.5 K/uL   RBC 4.91 3.87 - 5.11 MIL/uL   Hemoglobin 11.2 (L) 12.0 - 15.0 g/dL   HCT 41.2 36.0 - 46.0 %   MCV 83.9 78.0 - 100.0 fL   MCH 22.8 (L) 26.0 - 34.0 pg   MCHC 27.2 (L) 30.0 - 36.0 g/dL   RDW 24.6 (H) 11.5 - 15.5 %   Platelets 284 150 - 400 K/uL    Comment: Performed at [facility], [place] [address] [place], [place] [address]  Differential     Status: Abnormal   Collection Time: 01/08/18  8:54 AM  Result Value Ref  Range   Neutrophils Relative % 78 %   Lymphocytes Relative 14 %   Monocytes Relative 5 %   Eosinophils Relative 2 %   Basophils Relative 1 %   Neutro Abs 8.0 (H) 1.7 - 7.7 K/uL   Lymphs Abs 1.4 0.7 - 4.0 K/uL   Monocytes Absolute 0.5 0.1 - 1.0 K/uL   Eosinophils Absolute 0.2 0.0 -  0.7 K/uL   Basophils Absolute 0.1 0.0 - 0.1 K/uL   RBC Morphology POLYCHROMASIA PRESENT     Comment: Performed at [facility], [place] [address], [place], [place] [address]  Comprehensive metabolic panel     Status: Abnormal   Collection Time: 01/08/18  8:54 AM  Result Value Ref Range   Sodium 139 135 - 145 mmol/L   Potassium 3.7 3.5 - 5.1 mmol/L   Chloride 102 98 - 111 mmol/L   CO2 26 22 - 32 mmol/L   Glucose, Bld 181 (H) 70 - 99 mg/dL   BUN 35 (H) 8 - 23 mg/dL   Creatinine, Ser 5.10 (H) 0.44 - 1.00 mg/dL   Calcium 9.0 8.9 - 10.3 mg/dL   Total Protein 7.2 6.5 - 8.1 g/dL   Albumin 3.1 (L) 3.5 - 5.0 g/dL   AST 18 15 - 41 U/L   ALT 14 0 - 44 U/L   Alkaline Phosphatase 110 38 - 126 U/L   Total Bilirubin 0.7 0.3 - 1.2 mg/dL   GFR calc non Af Amer 7 (L) >60 mL/min   GFR calc Af Amer 8 (L) >60 mL/min    Comment: (NOTE) The eGFR has been calculated using the CKD EPI equation. This calculation has not been validated in all clinical situations. eGFR's persistently <60 mL/min signify possible Chronic Kidney Disease.    Anion gap 11 5 - 15    Comment: Performed at [facility] [address] [place], [place] [address]  I-stat troponin, ED     Status: None   Collection Time: 01/08/18  8:59 AM  Result Value Ref Range   Troponin i, poc 0.03 0.00 - 0.08 ng/mL   Comment 3            Comment: Due to the release kinetics of cTnI, a negative result within the first hours of the onset of symptoms does not rule out myocardial infarction with certainty. If myocardial infarction is still suspected, repeat the test at appropriate intervals.   I-Stat Chem 8, ED     Status: Abnormal   Collection Time:  01/08/18  9:01 AM  Result Value Ref Range   Sodium 140 135 - 145 mmol/L   Potassium 3.5 3.5 - 5.1 mmol/L   Chloride 101 98 - 111 mmol/L   BUN 36 (H) 8 - 23 mg/dL   Creatinine, Ser 5.30 (H) 0.44 - 1.00 mg/dL   Glucose, Bld 185 (H) 70 - 99 mg/dL   Calcium, Ion 1.11 (L) 1.15 - 1.40 mmol/L   TCO2 26 22 - 32 mmol/L   Hemoglobin 14.3 12.0 - 15.0 g/dL   HCT 42.0 36.0 - 46.0 %  MRSA PCR Screening     Status: None   Collection Time: 01/08/18 12:19 PM  Result Value Ref Range   MRSA by PCR NEGATIVE NEGATIVE    Comment:        The GeneXpert MRSA Assay (FDA approved for NASAL specimens only), is one component of a comprehensive MRSA colonization surveillance program. It is not intended to diagnose MRSA infection nor to guide or monitor treatment for MRSA infections. Performed at [facility], [place] [address] [place], [place] [address]   Troponin I     Status: None   Collection Time: 01/08/18  1:43 PM  Result Value Ref Range   Troponin I <0.03 <0.03 ng/mL    Comment: Performed at [facility] [address] [place],  [address]  Heparin  level (unfractionated)     Status: None   Collection Time: 01/08/18  8:56 PM  Result Value Ref Range   Heparin Unfractionated 0.36 0.30 - 0.70 IU/mL    Comment: (NOTE) If heparin results are below expected values, and patient dosage has  been confirmed, suggest follow up testing of antithrombin III levels. Performed at [facility], [place] [address], [place], [place] [address]   Glucose, capillary     Status: Abnormal   Collection Time: 01/08/18  9:57 PM  Result Value Ref Range   Glucose-Capillary 120 (H) 70 - 99 mg/dL   Comment 1 Notify RN    Comment 2 Document in Chart   Heparin level (unfractionated)     Status: Abnormal   Collection Time: 01/09/18  3:00 AM  Result Value Ref Range   Heparin Unfractionated 0.15 (L) 0.30 - 0.70 IU/mL    Comment: (NOTE) If heparin results are below expected values, and patient dosage has   been confirmed, suggest follow up testing of antithrombin III levels. Performed at [facility], [place] [address], [place], [place] [address]   CBC     Status: Abnormal   Collection Time: 01/09/18  3:00 AM  Result Value Ref Range   WBC 10.9 (H) 4.0 - 10.5 K/uL   RBC 4.64 3.87 - 5.11 MIL/uL   Hemoglobin 10.6 (L) 12.0 - 15.0 g/dL    Comment: DELTA CHECK NOTED REPEATED TO VERIFY    HCT 37.7 36.0 - 46.0 %   MCV 81.3 78.0 - 100.0 fL   MCH 22.8 (L) 26.0 - 34.0 pg   MCHC 28.1 (L) 30.0 - 36.0 g/dL   RDW 24.1 (H) 11.5 - 15.5 %   Platelets 271 150 - 400 K/uL    Comment: Performed at [facility], [place] [address] [place], [place] [address]  Hemoglobin A1c     Status: None   Collection Time: 01/09/18  3:00 AM  Result Value Ref Range   Hgb A1c MFr Bld 5.5 4.8 - 5.6 %    Comment: (NOTE) Pre diabetes:          5.7%-6.4% Diabetes:              >6.4% Glycemic control for   <7.0% adults with diabetes    Mean Plasma Glucose 111.15 mg/dL    Comment: Performed at [facility] [address] [place], [place] [address]  Lipid panel     Status: None   Collection Time: 01/09/18  3:00 AM  Result Value Ref Range   Cholesterol 77 0 - 200 mg/dL   Triglycerides 44 <150 mg/dL   HDL 46 >40 mg/dL   Total CHOL/HDL Ratio 1.7 RATIO   VLDL 9 0 - 40 mg/dL   LDL Cholesterol 22 0 - 99 mg/dL    Comment:        Total Cholesterol/HDL:CHD Risk Coronary Heart Disease Risk Table                     Men   Women  1/2 Average Risk   3.4   3.3  Average Risk       5.0   4.4  2 X Average Risk   9.6   7.1  3 X Average Risk  23.4   11.0        Use the calculated Patient Ratio above and the CHD Risk Table to determine the patient's CHD Risk.  ATP III CLASSIFICATION (LDL):  <100     mg/dL   Optimal  100-129  mg/dL   Near or Above                    Optimal  130-159  mg/dL   Borderline  160-189  mg/dL   High  >190     mg/dL   Very High Performed at [facility] [address] [place], [place] [address]   Comprehensive metabolic panel     Status: Abnormal   Collection Time: 01/09/18  3:00 AM  Result Value Ref Range   Sodium 137 135 - 145 mmol/L   Potassium 4.3 3.5 - 5.1 mmol/L   Chloride 99 98 - 111 mmol/L   CO2 27 22 - 32 mmol/L   Glucose, Bld 131 (H) 70 - 99 mg/dL   BUN 46 (H) 8 - 23 mg/dL   Creatinine, Ser 6.20 (H) 0.44 - 1.00 mg/dL   Calcium 8.5 (L) 8.9 - 10.3 mg/dL   Total Protein 6.2 (L) 6.5 - 8.1 g/dL   Albumin 2.8 (L) 3.5 - 5.0 g/dL   AST 16 15 - 41 U/L   ALT 13 0 - 44 U/L   Alkaline Phosphatase 88 38 - 126 U/L   Total Bilirubin 0.8 0.3 - 1.2 mg/dL   GFR calc non Af Amer 6 (L) >60 mL/min   GFR calc Af Amer 6 (L) >60 mL/min    Comment: (NOTE) The eGFR has been calculated using the CKD EPI equation. This calculation has not been validated in all clinical situations. eGFR's persistently <60 mL/min signify possible Chronic Kidney Disease.    Anion gap 11 5 - 15    Comment: Performed at [facility] [address], [place], [place] [address]  APTT     Status: Abnormal   Collection Time: 01/09/18  3:00 AM  Result Value Ref Range   aPTT 74 (H) 24 - 36 seconds    Comment:        IF BASELINE aPTT IS ELEVATED, SUGGEST PATIENT RISK ASSESSMENT BE USED TO DETERMINE APPROPRIATE ANTICOAGULANT THERAPY. Performed at [facility], [place] [address], [place], [place] [address]   Glucose, capillary     Status: Abnormal   Collection Time: 01/09/18  7:57 AM  Result Value Ref Range   Glucose-Capillary 125 (H) 70 - 99 mg/dL  Glucose, capillary     Status: Abnormal   Collection Time: 01/09/18 11:33 AM  Result Value Ref Range   Glucose-Capillary 116 (H) 70 - 99 mg/dL   Comment 1 Notify RN    Comment 2 Document in Chart    Dg Chest 2 View  Result Date: 01/08/2018 CLINICAL DATA:  LEFT-sided weakness.  Concern for infarct EXAM: CHEST - 2 VIEW COMPARISON:  06/27/2017 FINDINGS: Large-bore central venous line again noted. Normal cardiac silhouette. Low  lung volumes. No pulmonary edema. Degenerative spurring of the spine. IMPRESSION: Low lung volumes.  No acute findings. Electronically Signed   By: Suzy Bouchard M.D.   On: 01/08/2018 09:44   Ct Head Wo Contrast  Result Date: 01/08/2018 CLINICAL DATA:  Onset left-sided weakness this morning. EXAM: CT HEAD WITHOUT CONTRAST TECHNIQUE: Contiguous axial images were obtained from the base of the skull through the vertex without intravenous contrast. COMPARISON:  Head CT scan 07/08/2015. FINDINGS: Brain: No evidence of acute infarction, hemorrhage, hydrocephalus, extra-axial collection or mass lesion/mass effect. Atrophy and extensive chronic microvascular ischemic change are again seen.  Asymmetric atrophy of the right cerebellum relative to the left is chronic and unchanged. Vascular: Atherosclerosis noted. Skull: Intact.  No focal lesion. Sinuses/Orbits: Status post bilateral lens extraction. Otherwise negative. Other: None. IMPRESSION: No acute abnormality. Atrophy and extensive chronic microvascular ischemic change. Atherosclerosis. Electronically Signed   By: Inge Rise M.D.   On: 01/08/2018 09:35   Mr Jodene Nam Head Wo Contrast  Result Date: 01/08/2018 CLINICAL DATA:  TIA.  Diabetes EXAM: MRI HEAD WITHOUT CONTRAST MRA HEAD WITHOUT CONTRAST MRA NECK WITHOUT CONTRAST TECHNIQUE: Multiplanar, multiecho pulse sequences of the brain and surrounding structures were obtained without intravenous contrast. Angiographic images of the Circle of Willis were obtained using MRA technique without intravenous contrast. Angiographic images of the neck were obtained using MRA technique without intravenous contrast. Carotid stenosis measurements (when applicable) are obtained utilizing NASCET criteria, using the distal internal carotid diameter as the denominator. COMPARISON:  CT head 01/08/2018 FINDINGS: MRI HEAD FINDINGS Brain: Negative for acute infarct. Moderate atrophy without hydrocephalus. Moderate chronic microvascular  ischemic change throughout the white matter extending into the pons bilaterally and thalamus bilaterally. Chronic infarcts in the right brachium pontis and right cerebellum. Negative for hemorrhage or mass. Vascular: Normal arterial flow voids Skull and upper cervical spine: Diffuse cervical spondylosis. Extensive arthropathy C1-2 with prominent pannus posterior to the dens causing moderate spinal stenosis and cord flattening on the right. Question rheumatoid arthritis versus CPPD or osteoarthritis. Sinuses/Orbits: Paranasal sinuses clear.  Bilateral cataract surgery Other: None MRA HEAD FINDINGS Both vertebral artery patent to the basilar. Basilar widely patent. Posterior cerebral arteries are patent bilaterally with mild atherosclerotic disease distally bilaterally. Internal carotid artery patent bilaterally with moderate stenosis in the cavernous carotid bilaterally right greater than left. Anterior and middle cerebral arteries patent bilaterally. Moderate stenosis M2 segments bilaterally. Anterior cerebral arteries patent bilaterally. Negative for aneurysm. MRA NECK FINDINGS Suboptimal image quality due to motion and lack of intravenous contrast. Antegrade flow in carotid and vertebral arteries bilaterally. Proximal vertebral arteries are not imaged on the study related to artifact. Mid and distal vertebral arteries widely patent Internal carotid artery patent bilaterally. No high-grade stenosis. Limited detail through the carotid bifurcation. IMPRESSION: 1. Negative for acute infarct. Generalized atrophy with extensive chronic ischemic changes 2. Cervical spondylosis. Extensive C1-2 arthropathy with prominent pannus formation posterior to the dens causing spinal stenosis. Question rheumatoid arthritis 3. Mild to moderate intracranial atherosclerotic disease. No large vessel occlusion 4. Suboptimal MRA neck due to motion and lack of contrast. No hemodynamically significant stenosis in the carotid or vertebral  arteries as described above. Electronically Signed   By: Franchot Gallo M.D.   On: 01/08/2018 12:58   Mr Jodene Nam Neck Wo Contrast  Result Date: 01/08/2018 CLINICAL DATA:  TIA.  Diabetes EXAM: MRI HEAD WITHOUT CONTRAST MRA HEAD WITHOUT CONTRAST MRA NECK WITHOUT CONTRAST TECHNIQUE: Multiplanar, multiecho pulse sequences of the brain and surrounding structures were obtained without intravenous contrast. Angiographic images of the Circle of Willis were obtained using MRA technique without intravenous contrast. Angiographic images of the neck were obtained using MRA technique without intravenous contrast. Carotid stenosis measurements (when applicable) are obtained utilizing NASCET criteria, using the distal internal carotid diameter as the denominator. COMPARISON:  CT head 01/08/2018 FINDINGS: MRI HEAD FINDINGS Brain: Negative for acute infarct. Moderate atrophy without hydrocephalus. Moderate chronic microvascular ischemic change throughout the white matter extending into the pons bilaterally and thalamus bilaterally. Chronic infarcts in the right brachium pontis and right cerebellum. Negative for hemorrhage or mass. Vascular: Normal arterial flow  voids Skull and upper cervical spine: Diffuse cervical spondylosis. Extensive arthropathy C1-2 with prominent pannus posterior to the dens causing moderate spinal stenosis and cord flattening on the right. Question rheumatoid arthritis versus CPPD or osteoarthritis. Sinuses/Orbits: Paranasal sinuses clear.  Bilateral cataract surgery Other: None MRA HEAD FINDINGS Both vertebral artery patent to the basilar. Basilar widely patent. Posterior cerebral arteries are patent bilaterally with mild atherosclerotic disease distally bilaterally. Internal carotid artery patent bilaterally with moderate stenosis in the cavernous carotid bilaterally right greater than left. Anterior and middle cerebral arteries patent bilaterally. Moderate stenosis M2 segments bilaterally. Anterior cerebral  arteries patent bilaterally. Negative for aneurysm. MRA NECK FINDINGS Suboptimal image quality due to motion and lack of intravenous contrast. Antegrade flow in carotid and vertebral arteries bilaterally. Proximal vertebral arteries are not imaged on the study related to artifact. Mid and distal vertebral arteries widely patent Internal carotid artery patent bilaterally. No high-grade stenosis. Limited detail through the carotid bifurcation. IMPRESSION: 1. Negative for acute infarct. Generalized atrophy with extensive chronic ischemic changes 2. Cervical spondylosis. Extensive C1-2 arthropathy with prominent pannus formation posterior to the dens causing spinal stenosis. Question rheumatoid arthritis 3. Mild to moderate intracranial atherosclerotic disease. No large vessel occlusion 4. Suboptimal MRA neck due to motion and lack of contrast. No hemodynamically significant stenosis in the carotid or vertebral arteries as described above. Electronically Signed   By: Franchot Gallo M.D.   On: 01/08/2018 12:58   Mr Brain Wo Contrast  Result Date: 01/08/2018 CLINICAL DATA:  TIA.  Diabetes EXAM: MRI HEAD WITHOUT CONTRAST MRA HEAD WITHOUT CONTRAST MRA NECK WITHOUT CONTRAST TECHNIQUE: Multiplanar, multiecho pulse sequences of the brain and surrounding structures were obtained without intravenous contrast. Angiographic images of the Circle of Willis were obtained using MRA technique without intravenous contrast. Angiographic images of the neck were obtained using MRA technique without intravenous contrast. Carotid stenosis measurements (when applicable) are obtained utilizing NASCET criteria, using the distal internal carotid diameter as the denominator. COMPARISON:  CT head 01/08/2018 FINDINGS: MRI HEAD FINDINGS Brain: Negative for acute infarct. Moderate atrophy without hydrocephalus. Moderate chronic microvascular ischemic change throughout the white matter extending into the pons bilaterally and thalamus bilaterally.  Chronic infarcts in the right brachium pontis and right cerebellum. Negative for hemorrhage or mass. Vascular: Normal arterial flow voids Skull and upper cervical spine: Diffuse cervical spondylosis. Extensive arthropathy C1-2 with prominent pannus posterior to the dens causing moderate spinal stenosis and cord flattening on the right. Question rheumatoid arthritis versus CPPD or osteoarthritis. Sinuses/Orbits: Paranasal sinuses clear.  Bilateral cataract surgery Other: None MRA HEAD FINDINGS Both vertebral artery patent to the basilar. Basilar widely patent. Posterior cerebral arteries are patent bilaterally with mild atherosclerotic disease distally bilaterally. Internal carotid artery patent bilaterally with moderate stenosis in the cavernous carotid bilaterally right greater than left. Anterior and middle cerebral arteries patent bilaterally. Moderate stenosis M2 segments bilaterally. Anterior cerebral arteries patent bilaterally. Negative for aneurysm. MRA NECK FINDINGS Suboptimal image quality due to motion and lack of intravenous contrast. Antegrade flow in carotid and vertebral arteries bilaterally. Proximal vertebral arteries are not imaged on the study related to artifact. Mid and distal vertebral arteries widely patent Internal carotid artery patent bilaterally. No high-grade stenosis. Limited detail through the carotid bifurcation. IMPRESSION: 1. Negative for acute infarct. Generalized atrophy with extensive chronic ischemic changes 2. Cervical spondylosis. Extensive C1-2 arthropathy with prominent pannus formation posterior to the dens causing spinal stenosis. Question rheumatoid arthritis 3. Mild to moderate intracranial atherosclerotic disease. No large vessel occlusion  4. Suboptimal MRA neck due to motion and lack of contrast. No hemodynamically significant stenosis in the carotid or vertebral arteries as described above. Electronically Signed   By: Franchot Gallo M.D.   On: 01/08/2018 12:58     Review Of Systems Constitutional: No fever, chills, weight loss or gain. Eyes: No vision change, wears glasses. No discharge or pain. Ears: No hearing loss, No tinnitus. Respiratory: No asthma, COPD, pneumonias. Positive shortness of breath. No hemoptysis. Cardiovascular: Positive chest pain, palpitation, no leg edema. Gastrointestinal: No nausea, vomiting, diarrhea, constipation. No GI bleed. No hepatitis. Genitourinary: No dysuria, hematuria, kidney stone. No incontinance. ESRD Neurological: No headache, positive stroke, no seizures.  Psychiatry: No psych facility admission for anxiety, depression, suicide. No detox. Skin: No rash. Musculoskeletal: Positive joint pain, fibromyalgia. No neck pain, back pain. Lymphadenopathy: No lymphadenopathy. Hematology: Positive anemia or easy bruising.   Blood pressure (!) 76/52, pulse (!) 136, temperature 97.9 F (36.6 C), temperature source Oral, resp. rate 18, SpO2 96 %. There is no height or weight on file to calculate BMI. General appearance: alert, cooperative, appears stated age and no distress Head: Normocephalic, atraumatic. Eyes: Brown eyes, pink conjunctiva, corneas clear. PERRL, EOM's intact. Neck: No adenopathy, no carotid bruit, no JVD, supple, symmetrical, trachea midline and thyroid not enlarged. Resp: Clear to auscultation bilaterally. Cardio: Rapid, irregular rate and rhythm, S1, S2 normal, II/VI systolic murmur, no click, rub or gallop GI: Soft, non-tender; bowel sounds normal; no organomegaly. Extremities: No edema, cyanosis or clubbing. Right BKA. Skin: Warm and dry.  Neurologic: Alert and oriented X 3, Moves both upper extremities well.    Assessment/Plan Atrial fibrillation with RVR. CHA2DS2VASc score of 8 ESRD Type 2 DM Anemia of chronic disease Obesity Gout Stroke  Will use amiodarone and digoxin for rate control. Start apixaban 2.5 mg. twice daily.  Birdie Riddle, MD  01/09/2018, 11:47 AM

## 2018-01-09 NOTE — Progress Notes (Signed)
STROKE TEAM PROGRESS NOTE   HISTORY OF PRESENT ILLNESS (per record) [name] is an 84 y.o. female  With PMH significant for CVA 2015 ( right side residual deficits )  and per family 2008/2009 , DM, PAD right BKA. Presented tp [facility] ED for right gaze deviation and left side weakness.  Patient states that she was fine. She does not know why she is here. Family says that son was concerned. Son is not at bedside and is home resting for work [employer]ar[employer] manager.  Per chart review: Son noticed a right side gaze deviation and left side weakness. They were having a conversation and her gaze was stuck to the right. She was able to continue talking but was not able to look to her left. This lasted about 45 minutes. Resolved on exam. She has been at a rehab facility recovering from her right BKA. Patient denies any HA, SOB,CP, vision problems. Denies any falls or any LOC. Per family patient is on dialysis. Patient on Plavix 75 mg daily  ED course:  Patient found to be in a. Fib with rvr ( plan to start heparin) CT head: no acute abnormality MRI head: negative for acute infarct. BG: 181 BUN: 36 Creatinine: 5.30 BP: 100/57  Chart review of past stroke:  2008/2009 per family patient had a stroke with chronic right side weakness. D/t electronic conversion no records or images exist from this time.  12/14/2017  Acute 4.5 mm non-hemorrhagic infarct within the lateral left thalamus were posterior limb internal capsule Remote lacunar infarcts of the thalami bilaterally.  (residual right side chronic weakness and right hemi-sensory deficit Remote infarcts of the right cerebellum and brachium pontis.  Date last known well: Date: 01/08/2018 Time last known well: Time: 06:15 tPA Given: No: contraindicated; recent surgery Modified Rankin: Rankin Score=3  NIHSS:1   SUBJECTIVE (INTERVAL HISTORY) No family members present today.  The patient reports that she is nonambulatory and spends most of her time in bed  (recent Rt BKA). She was not able to provide any details regarding her hospital admission.    OBJECTIVE Vitals:   01/09/18 0600 01/09/18 0628 01/09/18 0805 01/09/18 1203  BP: (!) 89/53 96/75 (!) 76/52 (!) 99/52  Pulse:   (!) 136 (!) 137  Resp: (!) 25 18 18 16   Temp:   97.9 F (36.6 C) 98.1 F (36.7 C)  TempSrc:   Oral Axillary  SpO2:   96% 97%    CBC:  Recent Labs  Lab 01/08/18 0854 01/08/18 0901 01/09/18 0300  WBC 10.2  --  10.9*  NEUTROABS 8.0*  --   --   HGB 11.2* 14.3 10.6*  HCT 41.2 42.0 37.7  MCV 83.9  --  81.3  PLT 284  --  271    Basic Metabolic Panel:  Recent Labs  Lab 01/08/18 0854 01/08/18 0901 01/09/18 0300  NA 139 140 137  K 3.7 3.5 4.3  CL 102 101 99  CO2 26  --  27  GLUCOSE 181* 185* 131*  BUN 35* 36* 46*  CREATININE 5.10* 5.30* 6.20*  CALCIUM 9.0  --  8.5*    Lipid Panel:     Component Value Date/Time   CHOL 77 01/09/2018 0300   TRIG 44 01/09/2018 0300   HDL 46 01/09/2018 0300   CHOLHDL 1.7 01/09/2018 0300   VLDL 9 01/09/2018 0300   LDLCALC 22 01/09/2018 0300   HgbA1c:  Lab Results  Component Value Date   HGBA1C 5.5 01/09/2018   Urine Drug  Screen: No results found for: LABOPIA, COCAINSCRNUR, LABBENZ, AMPHETMU, THCU, LABBARB  Alcohol Level     Component Value Date/Time   ETH <10 01/08/2018 0854    IMAGING  Dg Chest 2 View 01/08/2018 IMPRESSION:  Low lung volumes.  No acute findings.    Ct Head Wo Contrast 01/08/2018 IMPRESSION:  No acute abnormality. Atrophy and extensive chronic microvascular ischemic change. Atherosclerosis.    Mr Maxine Glenn Head Wo Contrast 01/08/2018 IMPRESSION:  1. Negative for acute infarct. Generalized atrophy with extensive chronic ischemic changes  2. Cervical spondylosis. Extensive C1-2 arthropathy with prominent pannus formation posterior to the dens causing spinal stenosis. Question rheumatoid arthritis  3. Mild to moderate intracranial atherosclerotic disease. No large vessel occlusion  4.  Suboptimal MRA neck due to motion and lack of contrast. No hemodynamically significant stenosis in the carotid or vertebral arteries as described above.     Transthoracic Echocardiogram - pending 00/00/00    Bilateral Carotid Dopplers  01/09/2018 Final Interpretation: Right Carotid: Velocities in the right ICA are consistent with a 1-39% stenosis. Left Carotid: Velocities in the left ICA are consistent with a 1-39% stenosis. Vertebrals: Bilateral vertebral arteries demonstrate antegrade flow. Subclavians: Normal flow hemodynamics were seen in the left subclavian artery.       Unable to visualize right subclavian secondary to bandages.  EEG  01/09/2018 EEG Abnormalities: None Clinical Interpretation: This normal EEG is recorded in the waking and sleep state. There was no seizure or seizure predisposition recorded on this study. Please note that a normal EEG does not preclude the possibility of epilepsy.    PHYSICAL EXAM  Vitals:   01/09/18 0600 01/09/18 0628 01/09/18 0805 01/09/18 1203  BP: (!) 89/53 96/75 (!) 76/52 (!) 99/52  Pulse:   (!) 136 (!) 137  Resp: (!) 25 18 18 16   Temp:   97.9 F (36.6 C) 98.1 F (36.7 C)  TempSrc:   Oral Axillary  SpO2:   96% 97%    Obese elderly African-American lady who is lying comfortably in bed. She has right below-knee amputation. She has a wound bandage and the left leg.for sore at the bottom of the left foot  Neurological Exam ;  Awake  Alert oriented x 2.diminished attention, registration and recall.. Normal speech and language.eye movements full without nystagmus.fundi were not visualized. Vision acuity and fields appear normal. Hearing is normal. Palatal movements are normal. Face symmetric. Tongue midline. Normal strength, tone, reflexes and coordination except right leg testing limited due to amputation.. Normal sensation. Gait deferred.           ASSESSMENT/PLAN [name] is a 84 y.o. female with history of a  previous stroke, peripheral arterial disease, recent right BKA, obesity, end-stage renal disease, diabetes mellitus, and new onset atrial fibrillation with rapid ventricular response presenting with right gaze deviation and left-sided weakness. She did not receive IV t-PA due to recent surgery.  Possible TIA versus seizure:   Resultant no deficits  CT head - No acute abnormality  EEG - normal  MRI head - Negative for acute infarct.  MRA head - Mild to moderate intracranial atherosclerotic disease  MRA Neck - Suboptimal MRA neck due to motion and lack of contrast.  Carotid Doppler - unremarkable  2D Echo - pending  LDL - 22  HgbA1c - 5.5  VTE prophylaxis - Eliquis  Diet - renal carb modified with fluid restrictions  clopidogrel 75 mg daily prior to admission, now on clopidogrel 75 mg daily and Eliquis (apixaban) daily  Patient counseled to be compliant with her antithrombotic medications  Ongoing aggressive stroke risk factor management  Therapy recommendations:  pending  Disposition:  Pending  Hypertension  Blood pressure low (Systolic BP < 100) . Permissive hypertension (OK if < 220/120) but gradually normalize in 5-7 days . Long-term BP goal normotensive  Hyperlipidemia  Lipid lowering medication PTA:  none  LDL 22, goal < 70  Current lipid lowering medication: none  Continue statin at discharge  Diabetes  HgbA1c 5.5, goal < 7.0  Controlled  Other Stroke Risk Factors  Advanced age  Former cigarette smoker - quit  Hx stroke/TIA  Family hx stroke (mother)  Obesity  Atrial Fib - new diagnosis  Other Active Problems  Afib - now on Eliquis and Plavix  ESRD  Afib RVR - amiodarone - metoprolol - lanoxin  Hypotension    Plan - difficult situation  Consider stopping Plavix now that patient is on Eliquis.  Consider stopping Metoprolol 2/2 low BP if rate slows with Lanoxin and amiodarone.  Will need to use caution with ESRD ; Lanoxin  and Eliquis.  Stroke w/u complete except for Echo - pending   Hospital day # 1  Delton See PA-C [facility] Pager [phone] 01/09/2018, 2:36 PM I have personally examined this patient, reviewed notes, independently viewed imaging studies, participated in medical decision making and plan of care.ROS completed by me personally and pertinent positives fully documented  I have made any additions or clarifications directly to the above note. Agree with note above.  She presented with trans-and gaze deviation and weakness which appears to have resolved. Unclear as to whether this represents a seizure with postictal findings versus a TIA. Patient however has atrial fibrillation and needs long-term anticoagulation.agree with eliquis if her renal function permits it. Consider discontinuing Plavix due to increased risk for bleeding. Check echocardiogram results. Greater than 50% time during this 25 minute visit was spent on counseling and coordination of care about her TIA versus seizure episode and answered questions. Stroke team's sign off. Kindly call for questions. Discussed with Dr. Jorge Ny, MD Medical Director [facility] Pager: [phone] 01/09/2018 3:17 PM   To contact Stroke Continuity provider, please refer to [email or web address]. After hours, contact General Neurology

## 2018-01-10 ENCOUNTER — Encounter

## 2018-01-10 ENCOUNTER — Inpatient Hospital Stay (HOSPITAL_COMMUNITY): Payer: Medicare Other

## 2018-01-10 ENCOUNTER — Encounter: Payer: Medicare Other | Admitting: Vascular Surgery

## 2018-01-10 DIAGNOSIS — I96 Gangrene, not elsewhere classified: Secondary | ICD-10-CM | POA: Diagnosis present

## 2018-01-10 DIAGNOSIS — N186 End stage renal disease: Secondary | ICD-10-CM

## 2018-01-10 LAB — CBC
HCT: 36.4 % (ref 36.0–46.0)
Hemoglobin: 10.4 g/dL — ABNORMAL LOW (ref 12.0–15.0)
MCH: 23.2 pg — AB (ref 26.0–34.0)
MCHC: 28.6 g/dL — AB (ref 30.0–36.0)
MCV: 81.3 fL (ref 78.0–100.0)
PLATELETS: 257 10*3/uL (ref 150–400)
RBC: 4.48 MIL/uL (ref 3.87–5.11)
RDW: 23.7 % — AB (ref 11.5–15.5)
WBC: 10.4 10*3/uL (ref 4.0–10.5)

## 2018-01-10 LAB — GLUCOSE, CAPILLARY
GLUCOSE-CAPILLARY: 111 mg/dL — AB (ref 70–99)
GLUCOSE-CAPILLARY: 105 mg/dL — AB (ref 70–99)
Glucose-Capillary: 90 mg/dL (ref 70–99)
Glucose-Capillary: 101 mg/dL — ABNORMAL HIGH (ref 70–99)
Glucose-Capillary: 171 mg/dL — ABNORMAL HIGH (ref 70–99)

## 2018-01-10 LAB — RENAL FUNCTION PANEL
ANION GAP: 11 (ref 5–15)
Albumin: 2.6 g/dL — ABNORMAL LOW (ref 3.5–5.0)
BUN: 68 mg/dL — AB (ref 8–23)
CHLORIDE: 102 mmol/L (ref 98–111)
CO2: 23 mmol/L (ref 22–32)
Calcium: 8.2 mg/dL — ABNORMAL LOW (ref 8.9–10.3)
Creatinine, Ser: 7.86 mg/dL — ABNORMAL HIGH (ref 0.44–1.00)
GFR calc Af Amer: 5 mL/min — ABNORMAL LOW (ref >60)
GFR calc non Af Amer: 4 mL/min — ABNORMAL LOW (ref >60)
Glucose, Bld: 124 mg/dL — ABNORMAL HIGH (ref 70–99)
POTASSIUM: 4.5 mmol/L (ref 3.5–5.1)
Phosphorus: 5.9 mg/dL — ABNORMAL HIGH (ref 2.5–4.6)
Sodium: 136 mmol/L (ref 135–145)

## 2018-01-10 LAB — ECHOCARDIOGRAM COMPLETE: WEIGHTICAEL: 2881.85 [oz_av]

## 2018-01-10 MED ORDER — MIDODRINE HCL 5 MG PO TABS
10.0000 mg | ORAL_TABLET | Freq: Two times a day (BID) | ORAL | Status: DC
  Administered 2018-01-10 – 2018-01-12 (×4): 10 mg via ORAL
  Filled 2018-01-10 (×3): qty 2

## 2018-01-10 MED ORDER — OXYCODONE-ACETAMINOPHEN 5-325 MG PO TABS
ORAL_TABLET | ORAL | Status: AC
  Administered 2018-01-10: 12:00:00
  Filled 2018-01-10: qty 1

## 2018-01-10 MED ORDER — BISACODYL 10 MG RE SUPP
10.0000 mg | Freq: Once | RECTAL | Status: AC
  Administered 2018-01-10: 10 mg via RECTAL
  Filled 2018-01-10: qty 1

## 2018-01-10 NOTE — Progress Notes (Signed)
Occupational Therapy Treatment Patient Details Name: [name] MRN: [id number] DOB: 1932/12/08 Today's Date: 01/10/2018    History of present illness 84 yo female admitted from [facility] with afib with RVR and TIA. MRI (+) L lateral Lateral Thalamus infarct remote infarcts in both thalamus and R cerebellum and brachium pontis L heel wound .  PMH: anemia, ESRD with hemodialysis PVD with R BKA, DM2, obese, L side weakness    OT comments  Pt still needing mod assist for supine to sit EOB with min assist for dynamic sitting balance.  She was able to transfer from bed to recliner with use of the sliding board and total assist +2 ( pt 15%).  Recommend continued use of the [facility] for staff to transfer at this time.  Will continue to follow but feel extensive SNF level rehab will be needed.    Follow Up Recommendations  SNF    Equipment Recommendations  Other (comment)(TBD next venue of care)       Precautions / Restrictions Precautions Precautions: Fall Precaution Comments: L heel wound / R BKA with sock shrinker Restrictions Weight Bearing Restrictions: Yes LLE Weight Bearing: Non weight bearing       Mobility Bed Mobility Overal bed mobility: Needs Assistance Bed Mobility: Rolling;Supine to Sit Rolling: Min assist   Supine to sit: Mod assist     General bed mobility comments: Pt needed mod demonstrational cueing with mod assist for transition from right sidelying to sitting EOB.   Transfers Overall transfer level: Needs assistance Equipment used: Sliding board Transfers: Lateral/Scoot Transfers          Lateral/Scoot Transfers: Total assist General transfer comment: Pt needed total assist for sliding board transfer to the bedside recliner.     Balance Overall balance assessment: Needs assistance Sitting-balance support: Bilateral upper extremity supported Sitting balance-Leahy Scale: Poor Sitting balance - Comments: Posterior bias in sitting, pt with increased  fear to lean forward to assist with transfers.  Postural control: Posterior lean                                 ADL either performed or assessed with clinical judgement   ADL Overall ADL's : Needs assistance/impaired                                       General ADL Comments: Pt completed transfer via sliding board from bed to wheelchair with total assist.  Pt with increased posterior bias during transfers and little assistance given secondary to left heel wound and decreased efficiency of arm use.  She was able to scoot back in the chair with the right hip with supervision and the left with mod assist.                Cognition Arousal/Alertness: Awake/alert Behavior During Therapy: WFL for tasks assessed/performed Overall Cognitive Status: Within Functional Limits for tasks assessed                                                     Pertinent Vitals/ Pain       Pain Assessment: Faces Pain Location: L heel Pain Descriptors / Indicators: Grimacing Pain Intervention(s): Limited activity  within patient's tolerance;Monitored during session         Frequency  Min 2X/week        Progress Toward Goals  OT Goals(current goals can now be found in the care plan section)  Progress towards OT goals: Progressing toward goals     Plan Discharge plan remains appropriate       AM-PAC PT "6 Clicks" Daily Activity     Outcome Measure   Help from another person eating meals?: None Help from another person taking care of personal grooming?: A Little Help from another person toileting, which includes using toliet, bedpan, or urinal?: A Lot Help from another person bathing (including washing, rinsing, drying)?: A Lot Help from another person to put on and taking off regular upper body clothing?: A Little Help from another person to put on and taking off regular lower body clothing?: Total 6 Click Score: 15    End of Session  Equipment Utilized During Treatment: Other (comment)(sliding board)  OT Visit Diagnosis: Unsteadiness on feet (R26.81);Muscle weakness (generalized) (M62.81)   Activity Tolerance Patient tolerated treatment well   Patient Left in chair;with call bell/phone within reach;with chair alarm set;with family/visitor present   Nurse Communication Need for lift equipment        Time: 1525-1551 OT Time Calculation (min): 26 min  Charges: OT General Charges $OT Visit: 1 Visit OT Treatments $Self Care/Home Management : 23-37 mins    Kassia Demarinis OTR/L 01/10/2018, 5:16 PM

## 2018-01-10 NOTE — Progress Notes (Deleted)
  Echocardiogram 2D Echocardiogram has been performed.  Roosvelt Maser F 01/10/2018, 12:40 PM

## 2018-01-10 NOTE — Progress Notes (Signed)
[facility] Progress Note  Subjective: no new c/o today, on HD  Vitals:   01/10/18 0930 01/10/18 1000 01/10/18 1030 01/10/18 1100  BP: (!) 85/41 94/61 93/61  (!) 91/48  Pulse: 66 66 69 (!) 57  Resp:      Temp:      TempSrc:      SpO2:      Weight:        Inpatient medications: .  stroke: mapping our early stages of recovery book   Does not apply Once  . apixaban  2.5 mg Oral BID  . Chlorhexidine Gluconate Cloth  6 each Topical Q0600  . cinacalcet  30 mg Oral QPM  . digoxin  0.25 mg Intravenous Daily  . feeding supplement (NEPRO CARB STEADY)  237 mL Oral Daily  . feeding supplement (PRO-STAT SUGAR FREE 64)  30 mL Oral Daily  . gabapentin  100 mg Oral QHS  . insulin aspart  0-9 Units Subcutaneous TID WC  . multivitamin  1 tablet Oral QHS  . oxyCODONE-acetaminophen      . sevelamer carbonate  1,600 mg Oral With snacks  . sevelamer carbonate  2,400 mg Oral TID WC    oxyCODONE-acetaminophen, senna-docusate  Iron/TIBC/Ferritin/ %Sat    Component Value Date/Time   IRON 26 (L) 12/19/2006 1455   TIBC 86 (L) 12/19/2006 1455   FERRITIN 1076 (H) 12/19/2006 1455   IRONPCTSAT 30 12/19/2006 1455   IRONPCTSAT 16 (L) 10/18/2005 1610    Exam:  very obese, alert , on HD  no jvd  chest cta bilat  cor reg no mrg  abd soft ntnd no mass or ascites  ext R BKA stump wrapped, L foot bandaged, no edema  skin no rash  nonfocal, O x3    R IJ TDC  Dialysis: [facility]  4h  78kg  2/2.25 bath   Hep 5000  RIJ TDC ("Dr Imogene Burn felt no other Access available ")  - Mircera 150 mcg q 2wks (given 12/29/17  last op hgb 10.6 (01/05/18)   Impression: 1. ESRD -  HD on TTS.  HD today.  2. A fib with RVR - RX  per admit team, IV Hep / Metoprolol  3. TIA  -wu with Neuro consulting 4. HO CVA in past  5. Hypotension/ volume - BP's run low here as at home.  Not on BP meds or midodrine at home. Wt's are up 5-6kg if accurate, UF 2 L today as tol. Will start midodrine 10 bid. 6. Anemia   - HGB 11.2 no esa needs this week ( Mircera given 12/29/17)  Fu hgb trend  7. Metabolic bone disease - Renvela binder , Sensipar  q day , no  Er Ca ok , no  vit d on hd  8. Nutrition - Renal /Carb  Diet / renal vit  9. HO  Recent R BKA / L foot diabetic wound - wound care per admit  10. HO DM type 2 - per admit   Plan - as above   Jamie Moselle MD [facility] pager [phone]   01/10/2018, 11:28 AM   Recent Labs  Lab 01/08/18 0854  01/09/18 0300 01/10/18 0752  NA 139   < > 137 136  K 3.7   < > 4.3 4.5  CL 102   < > 99 102  CO2 26  --  27 23  GLUCOSE 181*   < > 131* 124*  BUN 35*   < > 46* 68*  CREATININE  5.10*   < > 6.20* 7.86*  CALCIUM 9.0  --  8.5* 8.2*  PHOS  --   --   --  5.9*  ALBUMIN 3.1*  --  2.8* 2.6*  INR 1.09  --   --   --    < > = values in this interval not displayed.   Recent Labs  Lab 01/08/18 0854 01/09/18 0300  AST 18 16  ALT 14 13  ALKPHOS 110 88  BILITOT 0.7 0.8  PROT 7.2 6.2*   Recent Labs  Lab 01/08/18 0854  01/09/18 0300 01/10/18 0452  WBC 10.2  --  10.9* 10.4  NEUTROABS 8.0*  --   --   --   HGB 11.2*   < > 10.6* 10.4*  HCT 41.2   < > 37.7 36.4  MCV 83.9  --  81.3 81.3  PLT 284  --  271 257   < > = values in this interval not displayed.

## 2018-01-10 NOTE — Progress Notes (Signed)
PROGRESS NOTE  [name]:[id number] DOB: 03-25-1933 DOA: 01/08/2018 PCP: Mila Palmer, MD  HPI/Recap of past 24 hours: [name] is a 84 y.o. female with medical history significant for PAD, DM, stroke, OA, end-stage renal disease on dialysis Tuesday Thursday Saturday, secondary hyperparathyroidism, history of gout, GERD, depression, and history of C. difficile diarrhea.   Presents to the emergency department with slurring of speech and not able to turn her eyes and look at a certain direction.  Son came to visit her at the rehab place and felt that her speech was slurred and she was not moving her left side of the body.  This lasted for about 45 minutes then resolved.   On presentation to the ED patient is in A. fib with RVR.  Started on metoprolol and heparin drip for new onset/diagnosis A. Fib.  Anticoagulation switched to Eliquis 2.5 mg twice daily.  Hospital course complicated by A. fib RVR and soft blood pressure.  01/10/2018: Patient seen and examined at her bedside at the hemodialysis center.  Blood pressure soft.  Started on Midodrine 10 mg twice daily by nephrology.  She denies chest pain, palpitations, or dyspnea.    Assessment/Plan: Principal Problem:   TIA (transient ischemic attack) Active Problems:   Pressure injury of skin  TIA/ Recent left lateral thalamus infarct and remote infarcts in both thalamus and right cerebellum and brachium pontis Neurological symptoms have resolved Continue Plavix LDL 22 per neurology can hold off statin  New onset/newly diagnosed A. fib with RVR 2D echo results pending-awaiting results Blood pressure is soft On Eliquis 2.5 mg twice daily for CVA prophylaxis Cardiology consulted and following Continue close monitoring on telemetry  Hypotension in the setting of A. fib RVR and end-stage renal disease on dialysis Tuesday Thursday Saturday Midrin started by nephrology 10 mg twice daily Maintain map above 65  End-stage renal  disease on dialysis Tuesday Thursday Saturday Nephrology consulted and following HD on 01/10/2018  Anemia of chronic disease secondary to end-stage renal disease Hemoglobin stable at 10 No sign of overt bleeding  Severe peripheral vascular disease status post right below the knee amputation Fall precautions Continue Plavix   Code Status: Full code  Family Communication: None at bedside  Disposition Plan: SNF when clinically stable and/or cardiology, nephrology sign off.   Consultants:  Cardiology  Neurology  Nephrology  Procedures: Hemodialysis  Antimicrobials:  None  DVT prophylaxis: Eliquis   Objective: Vitals:   01/10/18 1030 01/10/18 1100 01/10/18 1122 01/10/18 1150  BP: 93/61 (!) 91/48 (!) 93/41 (!) 85/54  Pulse: 69 (!) 57 (!) 105 78  Resp:   18 17  Temp:   97.9 F (36.6 C) 97.8 F (36.6 C)  TempSrc:   Oral Oral  SpO2:    94%  Weight:   81.7 kg     Intake/Output Summary (Last 24 hours) at 01/10/2018 1344 Last data filed at 01/10/2018 1306 Gross per 24 hour  Intake 120 ml  Output 2000 ml  Net -1880 ml   Filed Weights   01/10/18 0441 01/10/18 0715 01/10/18 1122  Weight: 87.7 kg 84.1 kg 81.7 kg    Exam:  . General: 84 y.o. year-old female obese in no acute distress.  Alert and interactive. . Cardiovascular: Irregular rate and rhythm with no rubs or gallops.  No JVD or thyromegaly noted. Marland Kitchen Respiratory: Clear to auscultation with no wheezes or rales. Good inspiratory effort. . Abdomen: Soft nontender nondistended with normal bowel sounds x4 quadrants. Marland Kitchen  Musculoskeletal: Right below the knee amputation. Marland Kitchen Psychiatry: Mood is appropriate for condition and setting   Data Reviewed: CBC: Recent Labs  Lab 01/08/18 0854 01/08/18 0901 01/09/18 0300 01/10/18 0452  WBC 10.2  --  10.9* 10.4  NEUTROABS 8.0*  --   --   --   HGB 11.2* 14.3 10.6* 10.4*  HCT 41.2 42.0 37.7 36.4  MCV 83.9  --  81.3 81.3  PLT 284  --  271 257   Basic Metabolic  Panel: Recent Labs  Lab 01/08/18 0854 01/08/18 0901 01/09/18 0300 01/10/18 0752  NA 139 140 137 136  K 3.7 3.5 4.3 4.5  CL 102 101 99 102  CO2 26  --  27 23  GLUCOSE 181* 185* 131* 124*  BUN 35* 36* 46* 68*  CREATININE 5.10* 5.30* 6.20* 7.86*  CALCIUM 9.0  --  8.5* 8.2*  PHOS  --   --   --  5.9*   GFR: Estimated Creatinine Clearance: 5.5 mL/min (A) (by C-G formula based on SCr of 7.86 mg/dL (H)). Liver Function Tests: Recent Labs  Lab 01/08/18 0854 01/09/18 0300 01/10/18 0752  AST 18 16  --   ALT 14 13  --   ALKPHOS 110 88  --   BILITOT 0.7 0.8  --   PROT 7.2 6.2*  --   ALBUMIN 3.1* 2.8* 2.6*   No results for input(s): LIPASE, AMYLASE in the last 168 hours. No results for input(s): AMMONIA in the last 168 hours. Coagulation Profile: Recent Labs  Lab 01/08/18 0854  INR 1.09   Cardiac Enzymes: Recent Labs  Lab 01/08/18 1343  TROPONINI <0.03   BNP (last 3 results) No results for input(s): PROBNP in the last 8760 hours. HbA1C: Recent Labs    01/09/18 0300  HGBA1C 5.5   CBG: Recent Labs  Lab 01/09/18 1608 01/09/18 2135 01/10/18 0625 01/10/18 1044 01/10/18 1153  GLUCAP 139* 151* 111* 90 101*   Lipid Profile: Recent Labs    01/09/18 0300  CHOL 77  HDL 46  LDLCALC 22  TRIG 44  CHOLHDL 1.7   Thyroid Function Tests: No results for input(s): TSH, T4TOTAL, FREET4, T3FREE, THYROIDAB in the last 72 hours. Anemia Panel: No results for input(s): VITAMINB12, FOLATE, FERRITIN, TIBC, IRON, RETICCTPCT in the last 72 hours. Urine analysis:    Component Value Date/Time   COLORURINE YELLOW 12/17/2006 0005   APPEARANCEUR TURBID (A) 12/17/2006 0005   LABSPEC 1.019 12/17/2006 0005   PHURINE 5.0 12/17/2006 0005   GLUCOSEU NEGATIVE 12/17/2006 0005   HGBUR MODERATE (A) 12/17/2006 0005   BILIRUBINUR SMALL (A) 12/17/2006 0005   KETONESUR 15 (A) 12/17/2006 0005   PROTEINUR NEGATIVE 12/17/2006 0005   UROBILINOGEN 0.2 12/17/2006 0005   NITRITE NEGATIVE  12/17/2006 0005   LEUKOCYTESUR LARGE (A) 12/17/2006 0005   Sepsis Labs: @LABRCNTIP (procalcitonin:4,lacticidven:4)  ) Recent Results (from the past 240 hour(s))  MRSA PCR Screening     Status: None   Collection Time: 01/08/18 12:19 PM  Result Value Ref Range Status   MRSA by PCR NEGATIVE NEGATIVE Final    Comment:        The GeneXpert MRSA Assay (FDA approved for NASAL specimens only), is one component of a comprehensive MRSA colonization surveillance program. It is not intended to diagnose MRSA infection nor to guide or monitor treatment for MRSA infections. Performed at [facility], [address], [place], [place] [address]       Studies: No results found.  Scheduled Meds: .  stroke: mapping our early stages of recovery book   Does not apply Once  . apixaban  2.5 mg Oral BID  . Chlorhexidine Gluconate Cloth  6 each Topical Q0600  . cinacalcet  30 mg Oral QPM  . feeding supplement (NEPRO CARB STEADY)  237 mL Oral Daily  . feeding supplement (PRO-STAT SUGAR FREE 64)  30 mL Oral Daily  . gabapentin  100 mg Oral QHS  . insulin aspart  0-9 Units Subcutaneous TID WC  . midodrine  10 mg Oral BID WC  . multivitamin  1 tablet Oral QHS  . sevelamer carbonate  1,600 mg Oral With snacks  . sevelamer carbonate  2,400 mg Oral TID WC    Continuous Infusions:    LOS: 2 days     Darlin Drop, MD [facility] Pager [phone]  If 7PM-7AM, please contact night-coverage [email or web address] Password [email or web address] 01/10/2018, 1:44 PM

## 2018-01-10 NOTE — Progress Notes (Signed)
  Echocardiogram 2D Echocardiogram has been performed.  Roosvelt Maser F 01/10/2018, 1:53 PM

## 2018-01-11 ENCOUNTER — Encounter: Payer: Self-pay | Admitting: Vascular Surgery

## 2018-01-11 ENCOUNTER — Ambulatory Visit (INDEPENDENT_AMBULATORY_CARE_PROVIDER_SITE_OTHER): Payer: Medicare Other | Admitting: Family

## 2018-01-11 ENCOUNTER — Ambulatory Visit (HOSPITAL_COMMUNITY): Payer: Medicare Other

## 2018-01-11 LAB — GLUCOSE, CAPILLARY
GLUCOSE-CAPILLARY: 135 mg/dL — AB (ref 70–99)
Glucose-Capillary: 95 mg/dL (ref 70–99)
Glucose-Capillary: 143 mg/dL — ABNORMAL HIGH (ref 70–99)
Glucose-Capillary: 205 mg/dL — ABNORMAL HIGH (ref 70–99)

## 2018-01-11 LAB — CBC
HEMATOCRIT: 41.9 % (ref 36.0–46.0)
Hemoglobin: 11.7 g/dL — ABNORMAL LOW (ref 12.0–15.0)
MCH: 22.8 pg — ABNORMAL LOW (ref 26.0–34.0)
MCHC: 27.9 g/dL — ABNORMAL LOW (ref 30.0–36.0)
MCV: 81.5 fL (ref 78.0–100.0)
Platelets: 247 10*3/uL (ref 150–400)
RBC: 5.14 MIL/uL — AB (ref 3.87–5.11)
RDW: 23.9 % — AB (ref 11.5–15.5)
WBC: 15.9 10*3/uL — AB (ref 4.0–10.5)

## 2018-01-11 MED ORDER — DIGOXIN 125 MCG PO TABS
0.0625 mg | ORAL_TABLET | Freq: Every day | ORAL | Status: DC
  Administered 2018-01-11 – 2018-01-12 (×2): 0.0625 mg via ORAL
  Filled 2018-01-11 (×2): qty 1

## 2018-01-11 MED ORDER — CHLORHEXIDINE GLUCONATE CLOTH 2 % EX PADS
6.0000 | MEDICATED_PAD | Freq: Every day | CUTANEOUS | Status: DC
  Administered 2018-01-12: 6 via TOPICAL

## 2018-01-11 MED ORDER — METOPROLOL TARTRATE 12.5 MG HALF TABLET
12.5000 mg | ORAL_TABLET | Freq: Two times a day (BID) | ORAL | Status: DC
  Administered 2018-01-11 – 2018-01-12 (×3): 12.5 mg via ORAL
  Filled 2018-01-11 (×3): qty 1

## 2018-01-11 MED ORDER — AMIODARONE HCL 200 MG PO TABS
200.0000 mg | ORAL_TABLET | Freq: Two times a day (BID) | ORAL | Status: DC
  Administered 2018-01-11 – 2018-01-12 (×3): 200 mg via ORAL
  Filled 2018-01-11 (×2): qty 1

## 2018-01-11 MED ORDER — FLEET ENEMA 7-19 GM/118ML RE ENEM
1.0000 | ENEMA | Freq: Once | RECTAL | Status: DC
  Filled 2018-01-11: qty 1

## 2018-01-11 NOTE — Consult Note (Signed)
Ref: Mila Palmer, MD   Subjective:  Atrial fibrillation continues. Tolerating small dose B-blocker with Digoxin and amiodarone + Apixaban. VS stable. Blood work shows low iron level.  Objective:  Vital Signs in the last 24 hours: Temp:  [98.2 F (36.8 C)-98.8 F (37.1 C)] 98.7 F (37.1 C) (09/04 1619) Pulse Rate:  [53-144] 73 (09/04 1619) Cardiac Rhythm: Atrial fibrillation (09/04 1307) Resp:  [16-18] 16 (09/04 1619) BP: (92-130)/(44-87) 92/44 (09/04 1619) SpO2:  [73 %-100 %] 98 % (09/04 1619)  Physical Exam: BP Readings from Last 1 Encounters:  01/11/18 (!) 92/44     Wt Readings from Last 1 Encounters:  01/10/18 81.7 kg    Weight change: -3.6 kg Body mass index is 31.91 kg/m. HEENT: [place]/AT, Eyes-Brown, PERL, EOMI, Conjunctiva-Pink, Sclera-Non-icteric Neck: No JVD, No bruit, Trachea midline. Lungs:  Clear, Bilateral. Cardiac:  Regular rhythm, normal S1 and S2, no S3. II/VI systolic murmur. Abdomen:  Soft, non-tender. BS present. Extremities:  No edema present. No cyanosis. No clubbing. Right BKA. Right IJ TDC. CNS: AxOx3, Cranial nerves grossly intact, moves all 4 extremities.  Skin: Warm and dry.   Intake/Output from previous day: 09/03 0701 - 09/04 0700 In: 360 [P.O.:360] Out: 2000     Lab Results: BMET    Component Value Date/Time   NA 136 01/10/2018 0752   NA 137 01/09/2018 0300   NA 140 01/08/2018 0901   NA 142 06/03/2014 1249   K 4.5 01/10/2018 0752   K 4.3 01/09/2018 0300   K 3.5 01/08/2018 0901   K 5.3 (H) 06/03/2014 1249   CL 102 01/10/2018 0752   CL 99 01/09/2018 0300   CL 101 01/08/2018 0901   CO2 23 01/10/2018 0752   CO2 27 01/09/2018 0300   CO2 26 01/08/2018 0854   CO2 25 06/03/2014 1249   GLUCOSE 124 (H) 01/10/2018 0752   GLUCOSE 131 (H) 01/09/2018 0300   GLUCOSE 185 (H) 01/08/2018 0901   GLUCOSE 107 06/03/2014 1249   BUN 68 (H) 01/10/2018 0752   BUN 46 (H) 01/09/2018 0300   BUN 36 (H) 01/08/2018 0901   BUN 75.0 (H) 06/03/2014 1249    CREATININE 7.86 (H) 01/10/2018 0752   CREATININE 6.20 (H) 01/09/2018 0300   CREATININE 5.30 (H) 01/08/2018 0901   CREATININE 12.5 Repeated and Verified (HH) 06/03/2014 1249   CALCIUM 8.2 (L) 01/10/2018 0752   CALCIUM 8.5 (L) 01/09/2018 0300   CALCIUM 9.0 01/08/2018 0854   CALCIUM 9.5 06/03/2014 1249   CALCIUM 8.9 12/05/2006 1357   GFRNONAA 4 (L) 01/10/2018 0752   GFRNONAA 6 (L) 01/09/2018 0300   GFRNONAA 7 (L) 01/08/2018 0854   GFRAA 5 (L) 01/10/2018 0752   GFRAA 6 (L) 01/09/2018 0300   GFRAA 8 (L) 01/08/2018 0854   CBC    Component Value Date/Time   WBC 15.9 (H) 01/11/2018 0442   RBC 5.14 (H) 01/11/2018 0442   HGB 11.7 (L) 01/11/2018 0442   HGB 12.8 06/03/2014 1249   HCT 41.9 01/11/2018 0442   HCT 43.0 06/03/2014 1249   PLT 247 01/11/2018 0442   PLT 350 06/03/2014 1249   MCV 81.5 01/11/2018 0442   MCV 76.6 (L) 06/03/2014 1249   MCH 22.8 (L) 01/11/2018 0442   MCHC 27.9 (L) 01/11/2018 0442   RDW 23.9 (H) 01/11/2018 0442   RDW 19.8 (H) 06/03/2014 1249   LYMPHSABS 1.4 01/08/2018 0854   LYMPHSABS 1.6 06/03/2014 1249   MONOABS 0.5 01/08/2018 0854   MONOABS 0.7 06/03/2014 1249  EOSABS 0.2 01/08/2018 0854   EOSABS 0.1 06/03/2014 1249   BASOSABS 0.1 01/08/2018 0854   BASOSABS 0.1 06/03/2014 1249   HEPATIC Function Panel Recent Labs    01/08/18 0854 01/09/18 0300  PROT 7.2 6.2*   HEMOGLOBIN A1C No components found for: HGA1C,  MPG CARDIAC ENZYMES Lab Results  Component Value Date   TROPONINI <0.03 01/08/2018   TROPONINI <0.30 11/04/2012   TROPONINI <0.30 11/04/2012   BNP No results for input(s): PROBNP in the last 8760 hours. TSH No results for input(s): TSH in the last 8760 hours. CHOLESTEROL Recent Labs    01/09/18 0300  CHOL 77    Scheduled Meds: .  stroke: mapping our early stages of recovery book   Does not apply Once  . amiodarone  200 mg Oral BID  . apixaban  2.5 mg Oral BID  . Chlorhexidine Gluconate Cloth  6 each Topical Q0600  . [START ON  01/12/2018] Chlorhexidine Gluconate Cloth  6 each Topical Q0600  . cinacalcet  30 mg Oral QPM  . digoxin  0.0625 mg Oral Daily  . feeding supplement (NEPRO CARB STEADY)  237 mL Oral Daily  . feeding supplement (PRO-STAT SUGAR FREE 64)  30 mL Oral Daily  . gabapentin  100 mg Oral QHS  . insulin aspart  0-9 Units Subcutaneous TID WC  . metoprolol tartrate  12.5 mg Oral BID  . midodrine  10 mg Oral BID WC  . multivitamin  1 tablet Oral QHS  . sevelamer carbonate  1,600 mg Oral With snacks  . sevelamer carbonate  2,400 mg Oral TID WC  . sodium phosphate  1 enema Rectal Once   Continuous Infusions: PRN Meds:.oxyCODONE-acetaminophen, senna-docusate  Assessment/Plan: Atrial fibrillation, new, now controlled rate ESRD TIA Type 2 DM Anemia of chronic disease and iron deficiency Obesity Gout Stroke  Increase activity as tolerated.   LOS: 3 days    Orpah Cobb  MD  01/11/2018, 5:21 PM

## 2018-01-11 NOTE — Care Management Important Message (Signed)
Important Message  Patient Details  Name: [name] MRN: [id number] Date of Birth: [birth year]   Medicare Important Message Given:  Yes    [name] 01/11/2018, 1:29 PM

## 2018-01-11 NOTE — Progress Notes (Signed)
PROGRESS NOTE    [name]  QAS:[id number] DOB: 10/01/1932 DOA: 01/08/2018 PCP: Mila Palmer, MD    Brief Narrative: 84 year old with past medical history relevant for type 2 diabetes, CVA with right residual deficits, ESRD on Tuesday/Thursday/Saturday dialysis complicated by secondary hyper parathyroidism, gout, depression, peripheral vascular disease status post right BKA on 11/11/2017 who was admitted with TIA-like symptoms and found to have new onset atrial fibrillation with rapid ventricular response and hypotension.   Assessment & Plan:   Principal Problem:   TIA (transient ischemic attack) Active Problems:   Pressure injury of skin   Gangrene (HCC)   #) TIA-like symptoms with history of CVA: Reportedly patient was found to have right gaze deviation and left-sided weakness.  Likely secondary to atrial fibrillation -MRI brain and MRA of head and neck on 01/08/2018 showed no infarct - Anticoagulation with apixaban per below - Pending discharge with control of heart rate  #) Atrial fibrillation with RVR, could by hypotension: Unfortunately patient's blood pressures are too soft to tolerate a beta-blocker or calcium channel blocker. -Cardiology following, appreciate recommendations -Echo on 01/10/2018 showed EF of 45 to 50% with mild hypokinesis with no wall motion abnormalities -Continue apixaban 2.5 mg twice daily - Continue amiodarone 200 mg twice daily -Continue digoxin load  #) Type 2 diabetes: - Sliding scale insulin, AC at bedtime  #) ESRD, gated by secondary hyperthyroidism: - Continue sevelamer 2400 mg 3 times daily with meals -Continue cinacalcet 30 mg nightly -Nephrology consult, appreciate recommendations  #) Peripheral vascular disease status post right BKA: -Discontinue clopidogrel  #) Pain/psych: -Continue gabapentin 100 mg nightly  Fluids: Tolerating p.o. Electrodes: Monitor and supplement Nutrition: Renal diet  Prophylaxis: Apixaban  Disposition:  Pending heart rate control  Full code  Consultants:   Nephrology  Cardiology  Neurology  Procedures:  Echo 01/10/2018: - Left ventricle: The cavity size was normal. There was mild   concentric hypertrophy. Systolic function was mildly reduced. The   estimated ejection fraction was in the range of 45% to 50%. There   is mild hypokinesis of the entire myocardium. - Aortic valve: Mild focal calcification involving the left   coronary cusp. - Mitral valve: Calcified annulus. - Left atrium: The atrium was moderately dilated. - Right ventricle: Systolic function was moderately reduced. - Right atrium: The atrium was mildly dilated. - Tricuspid valve: There was moderate regurgitation. - Pulmonary arteries: Systolic pressure was mildly increased. PA    peak pressure: 41 mm Hg (S).  Antimicrobials:   None   Subjective: Patient is quite sleepy but reports she is doing well.  She denies any pain.  She denies any nausea, vomiting, diarrhea.  She is eager to leave the hospital.  Objective: Vitals:   01/10/18 2003 01/11/18 0011 01/11/18 0530 01/11/18 0749  BP: 130/87 123/71 (!) 92/54 118/65  Pulse: (!) 144 (!) 53 78 82  Resp: 18 18 18 17   Temp: 98.8 F (37.1 C) 98.6 F (37 C) 98.2 F (36.8 C) 98.2 F (36.8 C)  TempSrc: Oral Oral Oral Axillary  SpO2: 100% 92% (!) 73% 95%  Weight:        Intake/Output Summary (Last 24 hours) at 01/11/2018 1114 Last data filed at 01/10/2018 2200 Gross per 24 hour  Intake 360 ml  Output 2000 ml  Net -1640 ml   Filed Weights   01/10/18 0441 01/10/18 0715 01/10/18 1122  Weight: 87.7 kg 84.1 kg 81.7 kg    Examination:  General exam: Appears calm and comfortable  Respiratory system: Clear to auscultation. Respiratory effort normal. Cardiovascular system: 2 out of 6 systolic murmur, irregularly irregular Gastrointestinal system: Abdomen is nondistended, soft and nontender. No organomegaly or masses felt. Normal bowel sounds heard. Central  nervous system: Alert and oriented.   Edentulous, difficult to understand, right-sided weakness Extremities: No lower extremity edema, old fistula sites in left upper extremity, Skin: Catheter site in right chest is clean dry and intact Psychiatry: Judgement and insight appear normal. Mood & affect appropriate.     Data Reviewed: I have personally reviewed following labs and imaging studies  CBC: Recent Labs  Lab 01/08/18 0854 01/08/18 0901 01/09/18 0300 01/10/18 0452 01/11/18 0442  WBC 10.2  --  10.9* 10.4 15.9*  NEUTROABS 8.0*  --   --   --   --   HGB 11.2* 14.3 10.6* 10.4* 11.7*  HCT 41.2 42.0 37.7 36.4 41.9  MCV 83.9  --  81.3 81.3 81.5  PLT 284  --  271 257 247   Basic Metabolic Panel: Recent Labs  Lab 01/08/18 0854 01/08/18 0901 01/09/18 0300 01/10/18 0752  NA 139 140 137 136  K 3.7 3.5 4.3 4.5  CL 102 101 99 102  CO2 26  --  27 23  GLUCOSE 181* 185* 131* 124*  BUN 35* 36* 46* 68*  CREATININE 5.10* 5.30* 6.20* 7.86*  CALCIUM 9.0  --  8.5* 8.2*  PHOS  --   --   --  5.9*   GFR: Estimated Creatinine Clearance: 5.5 mL/min (A) (by C-G formula based on SCr of 7.86 mg/dL (H)). Liver Function Tests: Recent Labs  Lab 01/08/18 0854 01/09/18 0300 01/10/18 0752  AST 18 16  --   ALT 14 13  --   ALKPHOS 110 88  --   BILITOT 0.7 0.8  --   PROT 7.2 6.2*  --   ALBUMIN 3.1* 2.8* 2.6*   No results for input(s): LIPASE, AMYLASE in the last 168 hours. No results for input(s): AMMONIA in the last 168 hours. Coagulation Profile: Recent Labs  Lab 01/08/18 0854  INR 1.09   Cardiac Enzymes: Recent Labs  Lab 01/08/18 1343  TROPONINI <0.03   BNP (last 3 results) No results for input(s): PROBNP in the last 8760 hours. HbA1C: Recent Labs    01/09/18 0300  HGBA1C 5.5   CBG: Recent Labs  Lab 01/10/18 1044 01/10/18 1153 01/10/18 1649 01/10/18 2117 01/11/18 0629  GLUCAP 90 101* 105* 171* 135*   Lipid Profile: Recent Labs    01/09/18 0300  CHOL 77  HDL  46  LDLCALC 22  TRIG 44  CHOLHDL 1.7   Thyroid Function Tests: No results for input(s): TSH, T4TOTAL, FREET4, T3FREE, THYROIDAB in the last 72 hours. Anemia Panel: No results for input(s): VITAMINB12, FOLATE, FERRITIN, TIBC, IRON, RETICCTPCT in the last 72 hours. Sepsis Labs: No results for input(s): PROCALCITON, LATICACIDVEN in the last 168 hours.  Recent Results (from the past 240 hour(s))  MRSA PCR Screening     Status: None   Collection Time: 01/08/18 12:19 PM  Result Value Ref Range Status   MRSA by PCR NEGATIVE NEGATIVE Final    Comment:        The GeneXpert MRSA Assay (FDA approved for NASAL specimens only), is one component of a comprehensive MRSA colonization surveillance program. It is not intended to diagnose MRSA infection nor to guide or monitor treatment for MRSA infections. Performed at [facility], [address], [place], [place] [address]  Radiology Studies: No results found.      Scheduled Meds: .  stroke: mapping our early stages of recovery book   Does not apply Once  . amiodarone  200 mg Oral BID  . apixaban  2.5 mg Oral BID  . Chlorhexidine Gluconate Cloth  6 each Topical Q0600  . cinacalcet  30 mg Oral QPM  . digoxin  0.0625 mg Oral Daily  . feeding supplement (NEPRO CARB STEADY)  237 mL Oral Daily  . feeding supplement (PRO-STAT SUGAR FREE 64)  30 mL Oral Daily  . gabapentin  100 mg Oral QHS  . insulin aspart  0-9 Units Subcutaneous TID WC  . metoprolol tartrate  12.5 mg Oral BID  . midodrine  10 mg Oral BID WC  . multivitamin  1 tablet Oral QHS  . sevelamer carbonate  1,600 mg Oral With snacks  . sevelamer carbonate  2,400 mg Oral TID WC  . sodium phosphate  1 enema Rectal Once   Continuous Infusions:   LOS: 3 days    Time spent: 35    Delaine Lame, MD [facility]  If 7PM-7AM, please contact night-coverage [email or web address] Password [email or web address] 01/11/2018, 11:14 AM

## 2018-01-11 NOTE — Clinical Social Work Note (Signed)
Clinical Social Work Assessment  Patient Details  Name: [name] MRN: [id number] Date of Birth: [birth year]  Date of referral:  01/11/18               Reason for consult:  Facility Placement, Discharge Planning                Permission sought to share information with:  Facility Sport and exercise psychologist, Family Supports Permission granted to share information::  Yes, Verbal Permission Granted  Name::     Ricci Barker  Agency::  [facility]  Relationship::  niece  Contact Information:  [phone]  Housing/Transportation Living arrangements for the past 2 months:  [place] of Information:  Patient Patient Interpreter Needed:  None Criminal Activity/Legal Involvement Pertinent to Current Situation/Hospitalization:  No - Comment as needed Significant Relationships:  Other Family Members, Adult Children Lives with:  Self Do you feel safe going back to the place where you live?  Yes Need for family participation in patient care:  Yes (Comment)  Care giving concerns: Patient initially from home independently, but has been at [facility] for rehab since 11/15/17. PT recommending continued SNF.   Social Worker assessment / plan: CSW met with patient at bedside. Patient alert and oriented. CSW introduced self and role and discussed disposition planning. Patient indicated she would like to return to [facility] to continue her rehab before returning home. Patient stated she hasn't been able to bear weight well, due to the wound on her foot. Patient's goal is to continue rehab and be able to walk again so she can go home.   CSW confirmed that [facility] and has a bed available for patient today. Paged MD. CSW also called patient's niece/POA, Janifer Adie, and updated niece on discharge plans. CSW to follow and support with discharge.  Employment status:  Retired Forensic scientist:  [employer] PT Recommendations:  [place] / Referral to community resources:  [place]  Patient/Family's Response to care: Patient appreciative of care.  Patient/Family's Understanding of and Emotional Response to Diagnosis, Current Treatment, and Prognosis: Patient with understanding of her condition and wants to return to rehab.  Emotional Assessment Appearance:  Appears stated age Attitude/Demeanor/Rapport:  Engaged Affect (typically observed):  Accepting, Appropriate, Pleasant Orientation:  Oriented to Self, Oriented to Place, Oriented to  Time, Oriented to Situation Alcohol / Substance use:  Not Applicable Psych involvement (Current and /or in the community):  No (Comment)  Discharge Needs  Concerns to be addressed:  Discharge Planning Concerns, Care Coordination Readmission within the last 30 days:  No Current discharge risk:  Physical Impairment Barriers to Discharge:  No Barriers Identified   Estanislado Emms, LCSW 01/11/2018, 11:00 AM

## 2018-01-11 NOTE — Progress Notes (Signed)
[facility] Progress Note  Subjective: no new c/o today  Vitals:   01/10/18 2003 01/11/18 0011 01/11/18 0530 01/11/18 0749  BP: 130/87 123/71 (!) 92/54 118/65  Pulse: (!) 144 (!) 53 78 82  Resp: 18 18 18 17   Temp: 98.8 F (37.1 C) 98.6 F (37 C) 98.2 F (36.8 C) 98.2 F (36.8 C)  TempSrc: Oral Oral Oral Axillary  SpO2: 100% 92% (!) 73% 95%  Weight:        Inpatient medications: .  stroke: mapping our early stages of recovery book   Does not apply Once  . amiodarone  200 mg Oral BID  . apixaban  2.5 mg Oral BID  . Chlorhexidine Gluconate Cloth  6 each Topical Q0600  . cinacalcet  30 mg Oral QPM  . digoxin  0.0625 mg Oral Daily  . feeding supplement (NEPRO CARB STEADY)  237 mL Oral Daily  . feeding supplement (PRO-STAT SUGAR FREE 64)  30 mL Oral Daily  . gabapentin  100 mg Oral QHS  . insulin aspart  0-9 Units Subcutaneous TID WC  . metoprolol tartrate  12.5 mg Oral BID  . midodrine  10 mg Oral BID WC  . multivitamin  1 tablet Oral QHS  . sevelamer carbonate  1,600 mg Oral With snacks  . sevelamer carbonate  2,400 mg Oral TID WC  . sodium phosphate  1 enema Rectal Once    oxyCODONE-acetaminophen, senna-docusate  Iron/TIBC/Ferritin/ %Sat    Component Value Date/Time   IRON 26 (L) 12/19/2006 1455   TIBC 86 (L) 12/19/2006 1455   FERRITIN 1076 (H) 12/19/2006 1455   IRONPCTSAT 30 12/19/2006 1455   IRONPCTSAT 16 (L) 10/18/2005 6440    Exam:  obese, alert , on HD  no jvd  chest cta bilat  cor reg no mrg  abd soft ntnd no mass or ascites  ext R BKA stump wrapped, L foot bandaged, no edema  skin no rash  nonfocal, O x3    R IJ TDC  Dialysis: [facility]  4h  78kg  2/2.25 bath   Hep 5000  RIJ TDC ("Dr Imogene Burn felt no other Access available ")  - Mircera 150 mcg q 2wks (given 12/29/17  last op hgb 10.6 (01/05/18)   Impression: 1. ESRD -  HD TTS.  HD tomorrow 2. A fib with RVR - BP's too low for dilt/ BB. Getting amio/ apxiaban and digoxin. Per  primary.  3. TIA  -wu with Neuro consulting 4. HO CVA in past  5. Hypotension/ volume - BP's low here and at home. Started midodrine 10 bid here. Got 2L off yest w/o difficulty. Cont to lower vol w/ HD tomorrow. 3kg up still.  6. Anemia ckd  - HGB 11.2 no esa needs this week ( Mircera given 12/29/17)  Fu hgb trend  7. Metabolic bone disease - Renvela binder , Sensipar  q day , no  Er Ca ok , no  vit d on hd  8. Nutrition - Renal /Carb  Diet / renal vit  9. HO  Recent R BKA / L foot diabetic wound - wound care per admit  10. HO DM type 2 - per admit   Plan - as above   Vinson Moselle MD [facility] pager [phone]   01/11/2018, 12:28 PM   Recent Labs  Lab 01/08/18 0854  01/09/18 0300 01/10/18 0752  NA 139   < > 137 136  K 3.7   < > 4.3 4.5  CL 102   < > 99 102  CO2 26  --  27 23  GLUCOSE 181*   < > 131* 124*  BUN 35*   < > 46* 68*  CREATININE 5.10*   < > 6.20* 7.86*  CALCIUM 9.0  --  8.5* 8.2*  PHOS  --   --   --  5.9*  ALBUMIN 3.1*  --  2.8* 2.6*  INR 1.09  --   --   --    < > = values in this interval not displayed.   Recent Labs  Lab 01/08/18 0854 01/09/18 0300  AST 18 16  ALT 14 13  ALKPHOS 110 88  BILITOT 0.7 0.8  PROT 7.2 6.2*   Recent Labs  Lab 01/08/18 0854  01/10/18 0452 01/11/18 0442  WBC 10.2   < > 10.4 15.9*  NEUTROABS 8.0*  --   --   --   HGB 11.2*   < > 10.4* 11.7*  HCT 41.2   < > 36.4 41.9  MCV 83.9   < > 81.3 81.5  PLT 284   < > 257 247   < > = values in this interval not displayed.

## 2018-01-11 NOTE — Consult Note (Signed)
Ref: Jamie Palmer, MD   Subjective:  Awake. Improving heart rate control. Echocardiogram with moderately dilated LA and mild LV systolic dysfunction.   Objective:  Vital Signs in the last 24 hours: Temp:  [97.8 F (36.6 C)-98.8 F (37.1 C)] 98.2 F (36.8 C) (09/04 0749) Pulse Rate:  [53-144] 82 (09/04 0749) Cardiac Rhythm: Atrial fibrillation (09/04 0700) Resp:  [17-18] 17 (09/04 0749) BP: (85-130)/(41-87) 118/65 (09/04 0749) SpO2:  [73 %-100 %] 95 % (09/04 0749) Weight:  [81.7 kg] 81.7 kg (09/03 1122)  Physical Exam: BP Readings from Last 1 Encounters:  01/11/18 118/65     Wt Readings from Last 1 Encounters:  01/10/18 81.7 kg    Weight change: -3.6 kg Body mass index is 31.91 kg/m. HEENT: [place]/AT, Eyes-Brown, PERL, EOMI, Conjunctiva-Pink, Sclera-Non-icteric Neck: No JVD, No bruit, Trachea midline. Lungs:  Clear, Bilateral. Cardiac:  Regular rhythm, normal S1 and S2, no S3. II/VI systolic murmur. Abdomen:  Soft, non-tender. BS present. Extremities:  No edema present. No cyanosis. No clubbing. Right BKA CNS: AxOx3, Cranial nerves grossly intact.  Skin: Warm and dry.   Intake/Output from previous day: 09/03 0701 - 09/04 0700 In: 360 [P.O.:360] Out: 2000     Lab Results: BMET    Component Value Date/Time   NA 136 01/10/2018 0752   NA 137 01/09/2018 0300   NA 140 01/08/2018 0901   NA 142 06/03/2014 1249   K 4.5 01/10/2018 0752   K 4.3 01/09/2018 0300   K 3.5 01/08/2018 0901   K 5.3 (H) 06/03/2014 1249   CL 102 01/10/2018 0752   CL 99 01/09/2018 0300   CL 101 01/08/2018 0901   CO2 23 01/10/2018 0752   CO2 27 01/09/2018 0300   CO2 26 01/08/2018 0854   CO2 25 06/03/2014 1249   GLUCOSE 124 (H) 01/10/2018 0752   GLUCOSE 131 (H) 01/09/2018 0300   GLUCOSE 185 (H) 01/08/2018 0901   GLUCOSE 107 06/03/2014 1249   BUN 68 (H) 01/10/2018 0752   BUN 46 (H) 01/09/2018 0300   BUN 36 (H) 01/08/2018 0901   BUN 75.0 (H) 06/03/2014 1249   CREATININE 7.86 (H) 01/10/2018  0752   CREATININE 6.20 (H) 01/09/2018 0300   CREATININE 5.30 (H) 01/08/2018 0901   CREATININE 12.5 Repeated and Verified (HH) 06/03/2014 1249   CALCIUM 8.2 (L) 01/10/2018 0752   CALCIUM 8.5 (L) 01/09/2018 0300   CALCIUM 9.0 01/08/2018 0854   CALCIUM 9.5 06/03/2014 1249   CALCIUM 8.9 12/05/2006 1357   GFRNONAA 4 (L) 01/10/2018 0752   GFRNONAA 6 (L) 01/09/2018 0300   GFRNONAA 7 (L) 01/08/2018 0854   GFRAA 5 (L) 01/10/2018 0752   GFRAA 6 (L) 01/09/2018 0300   GFRAA 8 (L) 01/08/2018 0854   CBC    Component Value Date/Time   WBC 15.9 (H) 01/11/2018 0442   RBC 5.14 (H) 01/11/2018 0442   HGB 11.7 (L) 01/11/2018 0442   HGB 12.8 06/03/2014 1249   HCT 41.9 01/11/2018 0442   HCT 43.0 06/03/2014 1249   PLT 247 01/11/2018 0442   PLT 350 06/03/2014 1249   MCV 81.5 01/11/2018 0442   MCV 76.6 (L) 06/03/2014 1249   MCH 22.8 (L) 01/11/2018 0442   MCHC 27.9 (L) 01/11/2018 0442   RDW 23.9 (H) 01/11/2018 0442   RDW 19.8 (H) 06/03/2014 1249   LYMPHSABS 1.4 01/08/2018 0854   LYMPHSABS 1.6 06/03/2014 1249   MONOABS 0.5 01/08/2018 0854   MONOABS 0.7 06/03/2014 1249   EOSABS 0.2 01/08/2018 0854  EOSABS 0.1 06/03/2014 1249   BASOSABS 0.1 01/08/2018 0854   BASOSABS 0.1 06/03/2014 1249   HEPATIC Function Panel Recent Labs    01/08/18 0854 01/09/18 0300  PROT 7.2 6.2*   HEMOGLOBIN A1C No components found for: HGA1C,  MPG CARDIAC ENZYMES Lab Results  Component Value Date   TROPONINI <0.03 01/08/2018   TROPONINI <0.30 11/04/2012   TROPONINI <0.30 11/04/2012   BNP No results for input(s): PROBNP in the last 8760 hours. TSH No results for input(s): TSH in the last 8760 hours. CHOLESTEROL Recent Labs    01/09/18 0300  CHOL 77    Scheduled Meds: .  stroke: mapping our early stages of recovery book   Does not apply Once  . amiodarone  200 mg Oral BID  . apixaban  2.5 mg Oral BID  . Chlorhexidine Gluconate Cloth  6 each Topical Q0600  . cinacalcet  30 mg Oral QPM  . digoxin   0.0625 mg Oral Daily  . feeding supplement (NEPRO CARB STEADY)  237 mL Oral Daily  . feeding supplement (PRO-STAT SUGAR FREE 64)  30 mL Oral Daily  . gabapentin  100 mg Oral QHS  . insulin aspart  0-9 Units Subcutaneous TID WC  . metoprolol tartrate  12.5 mg Oral BID  . midodrine  10 mg Oral BID WC  . multivitamin  1 tablet Oral QHS  . sevelamer carbonate  1,600 mg Oral With snacks  . sevelamer carbonate  2,400 mg Oral TID WC  . sodium phosphate  1 enema Rectal Once   Continuous Infusions: PRN Meds:.oxyCODONE-acetaminophen, senna-docusate  Assessment/Plan: Atrial fibrillation, now with controlled ventricular response ESRD Type 2 DM Anemia of chronic disease Obesity Gout Stroke  Continue medical treatment.   LOS: 2 days    Jamie Cobb  MD  01/11/2018, 10:38 AM

## 2018-01-11 NOTE — NC FL2 (Signed)
[place] MEDICAID FL2 LEVEL OF CARE SCREENING TOOL     IDENTIFICATION  Patient Name: [name] Birthdate: 1932-12-03 Sex: female Admission Date (Current Location): 01/08/2018  [facility] and [place] Number:  Producer, television/film/video and Address:  [facility] [place], [address], [place], [place] [address]      Provider Number: [id number]  Attending Physician Name and Address:  Delaine Lame, MD  Relative Name and Phone Number:  Lucile Crater, son, [phone]    Current Level of Care: Hospital Recommended Level of Care: Skilled Nursing Facility Prior Approval Number:    Date Approved/Denied:   PASRR Number: [id number]  Discharge Plan: SNF    Current Diagnoses: Patient Active Problem List   Diagnosis Date Noted  . Gangrene (HCC)   . Pressure injury of skin 01/09/2018  . TIA (transient ischemic attack) 01/08/2018  . Unilateral complete BKA, right, initial encounter (HCC)   . Diabetes mellitus type 2 in obese (HCC)   . History of CVA (cerebrovascular accident)   . Post-operative pain   . Acute blood loss anemia   . Anemia of chronic disease   . Benign essential HTN   . Morbid obesity (HCC)   . Below knee amputation status, right (HCC) 11/11/2017  . Gangrene of right foot (HCC)   . PAD (peripheral artery disease) (HCC) 04/29/2015  . Atherosclerosis of native arteries of the extremities with ulceration (HCC) 01/10/2014  . CVA (cerebral infarction) 12/14/2013  . Dysphagia, unspecified(787.20) 11/28/2013  . Diabetes mellitus with peripheral vascular disease (HCC) 10/31/2012  . Depression 10/31/2012  . Anemia 10/31/2012  . History of Clostridium difficile colitis 10/31/2012  . Chronic diarrhea 10/31/2012  . ESRD on dialysis (HCC) 10/06/2012    Orientation RESPIRATION BLADDER Height & Weight     Self, Time, Situation, Place  Normal Continent Weight: 180 lb 1.9 oz (81.7 kg) Height:     BEHAVIORAL SYMPTOMS/MOOD NEUROLOGICAL BOWEL NUTRITION STATUS      Continent Diet(please see DC summary)  AMBULATORY STATUS COMMUNICATION OF NEEDS Skin   Extensive Assist Verbally PU Stage and Appropriate Care(PU unstageable L heel, PU III R thigh, closed incision R leg)                       Personal Care Assistance Level of Assistance  Bathing, Feeding, Dressing Bathing Assistance: Maximum assistance Feeding assistance: Limited assistance Dressing Assistance: Maximum assistance     Functional Limitations Info  Sight, Hearing, Speech Sight Info: Adequate Hearing Info: Adequate Speech Info: Adequate    SPECIAL CARE FACTORS FREQUENCY  PT (By licensed PT), OT (By licensed OT)     PT Frequency: 5x/week OT Frequency: 5x/week            Contractures Contractures Info: Not present    Additional Factors Info  Code Status, Allergies, Insulin Sliding Scale Code Status Info: Full Allergies Info: Codeine, Vancomycin   Insulin Sliding Scale Info: novolog 3x/day with meals       Current Medications (01/11/2018):  This is the current hospital active medication list Current Facility-Administered Medications  Medication Dose Route Frequency Provider Last Rate Last Dose  .  stroke: mapping our early stages of recovery book   Does not apply Once Myrtie Neither, MD      . apixaban Everlene Balls) tablet 2.5 mg  2.5 mg Oral BID Orpah Cobb, MD   2.5 mg at 01/11/18 0911  . Chlorhexidine Gluconate Cloth 2 % PADS 6 each  6 each Topical  [id number] Delano Metz, MD   6 each at 01/10/18 [phone]  . cinacalcet (SENSIPAR) tablet 30 mg  30 mg Oral QPM Myrtie Neither, MD   30 mg at 01/10/18 1852  . feeding supplement (NEPRO CARB STEADY) liquid 237 mL  237 mL Oral Daily Myrtie Neither, MD   237 mL at 01/11/18 0911  . feeding supplement (PRO-STAT SUGAR FREE 64) liquid 30 mL  30 mL Oral Daily Myrtie Neither, MD   30 mL at 01/11/18 0912  . gabapentin (NEURONTIN) capsule 100 mg  100 mg Oral QHS Myrtie Neither, MD   100 mg at 01/10/18 2103  . insulin aspart  (novoLOG) injection 0-9 Units  0-9 Units Subcutaneous TID WC Myrtie Neither, MD   1 Units at 01/11/18 0912  . midodrine (PROAMATINE) tablet 10 mg  10 mg Oral BID WC Delano Metz, MD   10 mg at 01/11/18 0911  . multivitamin (RENA-VIT) tablet 1 tablet  1 tablet Oral QHS Lenny Pastel, PA-C   1 tablet at 01/10/18 2103  . oxyCODONE-acetaminophen (PERCOCET/ROXICET) 5-325 MG per tablet 1 tablet  1 tablet Oral Q6H PRN Myrtie Neither, MD   1 tablet at 01/10/18 2125  . senna-docusate (Senokot-S) tablet 1 tablet  1 tablet Oral QHS PRN Myrtie Neither, MD   1 tablet at 01/10/18 2103  . sevelamer carbonate (RENVELA) tablet 1,600 mg  1,600 mg Oral With snacks Legrand Pitts, RPH   1,600 mg at 01/08/18 2025  . sevelamer carbonate (RENVELA) tablet 2,400 mg  2,400 mg Oral TID WC Myrtie Neither, MD   2,400 mg at 01/11/18 0911  . sodium phosphate (FLEET) 7-19 GM/118ML enema 1 enema  1 enema Rectal Once Schorr, Roma Kayser, NP   Stopped at 01/11/18 7425     Discharge Medications: Please see discharge summary for a list of discharge medications.  Relevant Imaging Results:  Relevant Lab Results:   Additional Information SSN: [id number]  Abigail Butts, LCSW

## 2018-01-11 NOTE — Clinical Social Work Placement (Signed)
   CLINICAL SOCIAL WORK PLACEMENT  NOTE  Date:  01/11/2018  Patient Details  Name: [name] MRN: [id number] Date of Birth: February 19, 1933  Clinical Social Work is seeking post-discharge placement for this patient at the Skilled  Nursing Facility level of care (*CSW will initial, date and re-position this form in  chart as items are completed):  Yes   Patient/family provided with [facility] Clinical Social Work Department's list of facilities offering this level of care within the geographic area requested by the patient (or if unable, by the patient's family).  Yes   Patient/family informed of their freedom to choose among providers that offer the needed level of care, that participate in Medicare, Medicaid or managed care program needed by the patient, have an available bed and are willing to accept the patient.  Yes   Patient/family informed of [facility]'s ownership interest in [facility] and [facility], as well as of the fact that they are under no obligation to receive care at these facilities.  PASRR submitted to EDS on       PASRR number received on       Existing PASRR number confirmed on 01/11/18     FL2 transmitted to all facilities in geographic area requested by pt/family on 01/11/18     FL2 transmitted to all facilities within larger geographic area on       Patient informed that his/her managed care company has contracts with or will negotiate with certain facilities, including the following:  [facility]     Yes   Patient/family informed of bed offers received.  Patient chooses bed at [facility] and [facility]     Physician recommends and patient chooses bed at      Patient to be transferred to [facility] and [facility] on 01/11/18.  Patient to be transferred to facility by PTAR     Patient family notified on 01/11/18 of transfer.  Name of family member notified:  [name], niece     PHYSICIAN Please prepare priority  discharge summary, including medications, Please prepare prescriptions, Please sign FL2     Additional Comment:    _______________________________________________ Abigail Butts, LCSW 01/11/2018, 11:05 AM

## 2018-01-12 DIAGNOSIS — R933 Abnormal findings on diagnostic imaging of other parts of digestive tract: Secondary | ICD-10-CM | POA: Diagnosis not present

## 2018-01-12 DIAGNOSIS — I69354 Hemiplegia and hemiparesis following cerebral infarction affecting left non-dominant side: Secondary | ICD-10-CM | POA: Diagnosis not present

## 2018-01-12 DIAGNOSIS — L89620 Pressure ulcer of left heel, unstageable: Secondary | ICD-10-CM | POA: Diagnosis not present

## 2018-01-12 DIAGNOSIS — I4891 Unspecified atrial fibrillation: Secondary | ICD-10-CM | POA: Diagnosis present

## 2018-01-12 DIAGNOSIS — I361 Nonrheumatic tricuspid (valve) insufficiency: Secondary | ICD-10-CM | POA: Diagnosis not present

## 2018-01-12 DIAGNOSIS — I953 Hypotension of hemodialysis: Secondary | ICD-10-CM | POA: Diagnosis not present

## 2018-01-12 DIAGNOSIS — Z8673 Personal history of transient ischemic attack (TIA), and cerebral infarction without residual deficits: Secondary | ICD-10-CM | POA: Diagnosis not present

## 2018-01-12 DIAGNOSIS — M6281 Muscle weakness (generalized): Secondary | ICD-10-CM | POA: Diagnosis not present

## 2018-01-12 DIAGNOSIS — Z7902 Long term (current) use of antithrombotics/antiplatelets: Secondary | ICD-10-CM | POA: Diagnosis not present

## 2018-01-12 DIAGNOSIS — E1129 Type 2 diabetes mellitus with other diabetic kidney complication: Secondary | ICD-10-CM | POA: Diagnosis not present

## 2018-01-12 DIAGNOSIS — K5900 Constipation, unspecified: Secondary | ICD-10-CM | POA: Diagnosis present

## 2018-01-12 DIAGNOSIS — K219 Gastro-esophageal reflux disease without esophagitis: Secondary | ICD-10-CM | POA: Diagnosis not present

## 2018-01-12 DIAGNOSIS — G459 Transient cerebral ischemic attack, unspecified: Secondary | ICD-10-CM | POA: Diagnosis not present

## 2018-01-12 DIAGNOSIS — Z7901 Long term (current) use of anticoagulants: Secondary | ICD-10-CM | POA: Diagnosis not present

## 2018-01-12 DIAGNOSIS — M4802 Spinal stenosis, cervical region: Secondary | ICD-10-CM | POA: Diagnosis not present

## 2018-01-12 DIAGNOSIS — N2581 Secondary hyperparathyroidism of renal origin: Secondary | ICD-10-CM | POA: Diagnosis present

## 2018-01-12 DIAGNOSIS — Z7401 Bed confinement status: Secondary | ICD-10-CM | POA: Diagnosis not present

## 2018-01-12 DIAGNOSIS — M8610 Other acute osteomyelitis, unspecified site: Secondary | ICD-10-CM | POA: Diagnosis not present

## 2018-01-12 DIAGNOSIS — I7025 Atherosclerosis of native arteries of other extremities with ulceration: Secondary | ICD-10-CM | POA: Diagnosis not present

## 2018-01-12 DIAGNOSIS — Z794 Long term (current) use of insulin: Secondary | ICD-10-CM | POA: Diagnosis not present

## 2018-01-12 DIAGNOSIS — E8889 Other specified metabolic disorders: Secondary | ICD-10-CM | POA: Diagnosis present

## 2018-01-12 DIAGNOSIS — Z90710 Acquired absence of both cervix and uterus: Secondary | ICD-10-CM | POA: Diagnosis not present

## 2018-01-12 DIAGNOSIS — M1990 Unspecified osteoarthritis, unspecified site: Secondary | ICD-10-CM | POA: Diagnosis not present

## 2018-01-12 DIAGNOSIS — I739 Peripheral vascular disease, unspecified: Secondary | ICD-10-CM | POA: Diagnosis not present

## 2018-01-12 DIAGNOSIS — L89152 Pressure ulcer of sacral region, stage 2: Secondary | ICD-10-CM | POA: Diagnosis present

## 2018-01-12 DIAGNOSIS — Z823 Family history of stroke: Secondary | ICD-10-CM | POA: Diagnosis not present

## 2018-01-12 DIAGNOSIS — D72829 Elevated white blood cell count, unspecified: Secondary | ICD-10-CM | POA: Diagnosis not present

## 2018-01-12 DIAGNOSIS — I482 Chronic atrial fibrillation: Secondary | ICD-10-CM | POA: Diagnosis not present

## 2018-01-12 DIAGNOSIS — M109 Gout, unspecified: Secondary | ICD-10-CM | POA: Diagnosis present

## 2018-01-12 DIAGNOSIS — Z23 Encounter for immunization: Secondary | ICD-10-CM | POA: Diagnosis not present

## 2018-01-12 DIAGNOSIS — K529 Noninfective gastroenteritis and colitis, unspecified: Secondary | ICD-10-CM | POA: Diagnosis not present

## 2018-01-12 DIAGNOSIS — E213 Hyperparathyroidism, unspecified: Secondary | ICD-10-CM | POA: Diagnosis not present

## 2018-01-12 DIAGNOSIS — E119 Type 2 diabetes mellitus without complications: Secondary | ICD-10-CM | POA: Diagnosis not present

## 2018-01-12 DIAGNOSIS — L089 Local infection of the skin and subcutaneous tissue, unspecified: Secondary | ICD-10-CM | POA: Diagnosis not present

## 2018-01-12 DIAGNOSIS — E1122 Type 2 diabetes mellitus with diabetic chronic kidney disease: Secondary | ICD-10-CM | POA: Diagnosis present

## 2018-01-12 DIAGNOSIS — L97429 Non-pressure chronic ulcer of left heel and midfoot with unspecified severity: Secondary | ICD-10-CM | POA: Diagnosis not present

## 2018-01-12 DIAGNOSIS — Z6831 Body mass index (BMI) 31.0-31.9, adult: Secondary | ICD-10-CM | POA: Diagnosis not present

## 2018-01-12 DIAGNOSIS — Z8619 Personal history of other infectious and parasitic diseases: Secondary | ICD-10-CM | POA: Diagnosis not present

## 2018-01-12 DIAGNOSIS — I70201 Unspecified atherosclerosis of native arteries of extremities, right leg: Secondary | ICD-10-CM | POA: Diagnosis not present

## 2018-01-12 DIAGNOSIS — Z87891 Personal history of nicotine dependence: Secondary | ICD-10-CM | POA: Diagnosis not present

## 2018-01-12 DIAGNOSIS — A480 Gas gangrene: Secondary | ICD-10-CM | POA: Diagnosis not present

## 2018-01-12 DIAGNOSIS — Z992 Dependence on renal dialysis: Secondary | ICD-10-CM | POA: Diagnosis not present

## 2018-01-12 DIAGNOSIS — N186 End stage renal disease: Secondary | ICD-10-CM | POA: Diagnosis present

## 2018-01-12 DIAGNOSIS — M2550 Pain in unspecified joint: Secondary | ICD-10-CM | POA: Diagnosis not present

## 2018-01-12 DIAGNOSIS — I9589 Other hypotension: Secondary | ICD-10-CM | POA: Diagnosis not present

## 2018-01-12 DIAGNOSIS — Z7189 Other specified counseling: Secondary | ICD-10-CM | POA: Diagnosis not present

## 2018-01-12 DIAGNOSIS — D631 Anemia in chronic kidney disease: Secondary | ICD-10-CM | POA: Diagnosis present

## 2018-01-12 DIAGNOSIS — D509 Iron deficiency anemia, unspecified: Secondary | ICD-10-CM | POA: Diagnosis not present

## 2018-01-12 DIAGNOSIS — M79672 Pain in left foot: Secondary | ICD-10-CM | POA: Diagnosis not present

## 2018-01-12 DIAGNOSIS — E1152 Type 2 diabetes mellitus with diabetic peripheral angiopathy with gangrene: Secondary | ICD-10-CM | POA: Diagnosis present

## 2018-01-12 DIAGNOSIS — Z89511 Acquired absence of right leg below knee: Secondary | ICD-10-CM | POA: Diagnosis not present

## 2018-01-12 DIAGNOSIS — Z66 Do not resuscitate: Secondary | ICD-10-CM | POA: Diagnosis not present

## 2018-01-12 DIAGNOSIS — Z833 Family history of diabetes mellitus: Secondary | ICD-10-CM | POA: Diagnosis not present

## 2018-01-12 DIAGNOSIS — E1140 Type 2 diabetes mellitus with diabetic neuropathy, unspecified: Secondary | ICD-10-CM | POA: Diagnosis present

## 2018-01-12 DIAGNOSIS — M86172 Other acute osteomyelitis, left ankle and foot: Secondary | ICD-10-CM | POA: Diagnosis present

## 2018-01-12 DIAGNOSIS — Z515 Encounter for palliative care: Secondary | ICD-10-CM | POA: Diagnosis not present

## 2018-01-12 DIAGNOSIS — I4821 Permanent atrial fibrillation: Secondary | ICD-10-CM | POA: Diagnosis not present

## 2018-01-12 DIAGNOSIS — T148XXA Other injury of unspecified body region, initial encounter: Secondary | ICD-10-CM | POA: Diagnosis not present

## 2018-01-12 DIAGNOSIS — L97409 Non-pressure chronic ulcer of unspecified heel and midfoot with unspecified severity: Secondary | ICD-10-CM | POA: Diagnosis not present

## 2018-01-12 DIAGNOSIS — I70244 Atherosclerosis of native arteries of left leg with ulceration of heel and midfoot: Secondary | ICD-10-CM | POA: Diagnosis not present

## 2018-01-12 DIAGNOSIS — G629 Polyneuropathy, unspecified: Secondary | ICD-10-CM | POA: Diagnosis not present

## 2018-01-12 DIAGNOSIS — I959 Hypotension, unspecified: Secondary | ICD-10-CM | POA: Diagnosis not present

## 2018-01-12 DIAGNOSIS — R488 Other symbolic dysfunctions: Secondary | ICD-10-CM | POA: Diagnosis not present

## 2018-01-12 DIAGNOSIS — I96 Gangrene, not elsewhere classified: Secondary | ICD-10-CM | POA: Diagnosis not present

## 2018-01-12 DIAGNOSIS — I2510 Atherosclerotic heart disease of native coronary artery without angina pectoris: Secondary | ICD-10-CM | POA: Diagnosis present

## 2018-01-12 DIAGNOSIS — I120 Hypertensive chronic kidney disease with stage 5 chronic kidney disease or end stage renal disease: Secondary | ICD-10-CM | POA: Diagnosis present

## 2018-01-12 DIAGNOSIS — E669 Obesity, unspecified: Secondary | ICD-10-CM | POA: Diagnosis not present

## 2018-01-12 DIAGNOSIS — D638 Anemia in other chronic diseases classified elsewhere: Secondary | ICD-10-CM | POA: Diagnosis not present

## 2018-01-12 DIAGNOSIS — I10 Essential (primary) hypertension: Secondary | ICD-10-CM | POA: Diagnosis not present

## 2018-01-12 DIAGNOSIS — E1151 Type 2 diabetes mellitus with diabetic peripheral angiopathy without gangrene: Secondary | ICD-10-CM | POA: Diagnosis not present

## 2018-01-12 DIAGNOSIS — L98499 Non-pressure chronic ulcer of skin of other sites with unspecified severity: Secondary | ICD-10-CM | POA: Diagnosis not present

## 2018-01-12 DIAGNOSIS — Z79899 Other long term (current) drug therapy: Secondary | ICD-10-CM | POA: Diagnosis not present

## 2018-01-12 LAB — CBC
HCT: 36.1 % (ref 36.0–46.0)
Hemoglobin: 10.5 g/dL — ABNORMAL LOW (ref 12.0–15.0)
MCH: 23.3 pg — ABNORMAL LOW (ref 26.0–34.0)
MCHC: 29.1 g/dL — ABNORMAL LOW (ref 30.0–36.0)
MCV: 80 fL (ref 78.0–100.0)
Platelets: 264 K/uL (ref 150–400)
RBC: 4.51 MIL/uL (ref 3.87–5.11)
RDW: 23 % — ABNORMAL HIGH (ref 11.5–15.5)
WBC: 12.4 10*3/uL — ABNORMAL HIGH (ref 4.0–10.5)

## 2018-01-12 LAB — GLUCOSE, CAPILLARY
GLUCOSE-CAPILLARY: 89 mg/dL (ref 70–99)
Glucose-Capillary: 83 mg/dL (ref 70–99)
Glucose-Capillary: 119 mg/dL — ABNORMAL HIGH (ref 70–99)

## 2018-01-12 LAB — RENAL FUNCTION PANEL
Albumin: 2.6 g/dL — ABNORMAL LOW (ref 3.5–5.0)
Anion gap: 13 (ref 5–15)
BUN: 51 mg/dL — ABNORMAL HIGH (ref 8–23)
CO2: 23 mmol/L (ref 22–32)
Calcium: 8.3 mg/dL — ABNORMAL LOW (ref 8.9–10.3)
Chloride: 97 mmol/L — ABNORMAL LOW (ref 98–111)
Creatinine, Ser: 6.59 mg/dL — ABNORMAL HIGH (ref 0.44–1.00)
GFR calc Af Amer: 6 mL/min — ABNORMAL LOW (ref >60)
GFR calc non Af Amer: 5 mL/min — ABNORMAL LOW (ref >60)
Glucose, Bld: 94 mg/dL (ref 70–99)
Phosphorus: 4.8 mg/dL — ABNORMAL HIGH (ref 2.5–4.6)
Potassium: 4.4 mmol/L (ref 3.5–5.1)
Sodium: 133 mmol/L — ABNORMAL LOW (ref 135–145)

## 2018-01-12 LAB — MAGNESIUM: Magnesium: 2.1 mg/dL (ref 1.7–2.4)

## 2018-01-12 MED ORDER — METOPROLOL TARTRATE 25 MG PO TABS: 12.5000 mg | ORAL_TABLET | Freq: Two times a day (BID) | ORAL | 0 refills | Status: DC

## 2018-01-12 MED ORDER — HEPARIN SODIUM (PORCINE) 1000 UNIT/ML DIALYSIS: 5000.0000 [IU] | Freq: Once | INTRAMUSCULAR | Status: DC

## 2018-01-12 MED ORDER — APIXABAN 2.5 MG PO TABS: 2.5000 mg | ORAL_TABLET | Freq: Two times a day (BID) | ORAL | 0 refills | Status: DC

## 2018-01-12 MED ORDER — MIDODRINE HCL 5 MG PO TABS
ORAL_TABLET | ORAL | Status: AC
  Filled 2018-01-12: qty 2

## 2018-01-12 MED ORDER — MIDODRINE HCL 10 MG PO TABS: 10.0000 mg | ORAL_TABLET | Freq: Two times a day (BID) | ORAL | 0 refills | Status: DC

## 2018-01-12 MED ORDER — DIGOXIN 62.5 MCG PO TABS: 0.0625 mg | ORAL_TABLET | Freq: Every day | ORAL | 0 refills | Status: DC

## 2018-01-12 MED ORDER — AMIODARONE HCL 200 MG PO TABS: 200.0000 mg | ORAL_TABLET | Freq: Two times a day (BID) | ORAL | 0 refills | Status: DC

## 2018-01-12 NOTE — Progress Notes (Signed)
PT Cancellation Note  Patient Details Name: EVELY BULICK MRN: [id number] DOB: 11-Jan-1933   Cancelled Treatment:    Reason Eval/Treat Not Completed: Patient at procedure or test/unavailable At HD - will follow-up as time allows.  Kipp Laurence, PT, DPT 01/12/18 8:58 AM Pager: [phone]

## 2018-01-12 NOTE — Clinical Social Work Placement (Signed)
   CLINICAL SOCIAL WORK PLACEMENT  NOTE  Date:  01/12/2018  Patient Details  Name: [name] MRN: [id number] Date of Birth: Sep 06, 1932  Clinical Social Work is seeking post-discharge placement for this patient at the Skilled  Nursing Facility level of care (*CSW will initial, date and re-position this form in  chart as items are completed):  Yes   Patient/family provided with [place] Clinical Social Work Department's list of facilities offering this level of care within the geographic area requested by the patient (or if unable, by the patient's family).  Yes   Patient/family informed of their freedom to choose among providers that offer the needed level of care, that participate in Medicare, Medicaid or managed care program needed by the patient, have an available bed and are willing to accept the patient.  Yes   Patient/family informed of [facility]'s ownership interest in [facility] and [facility], as well as of the fact that they are under no obligation to receive care at these facilities.  PASRR submitted to EDS on       PASRR number received on       Existing PASRR number confirmed on 01/11/18     FL2 transmitted to all facilities in geographic area requested by pt/family on 01/11/18     FL2 transmitted to all facilities within larger geographic area on       Patient informed that his/her managed care company has contracts with or will negotiate with certain facilities, including the following:  [facility]     Yes   Patient/family informed of bed offers received.  Patient chooses bed at [facility]     Physician recommends and patient chooses bed at      Patient to be transferred to [facility] on 01/12/18.  Patient to be transferred to facility by PTAR     Patient family notified on 01/12/18 of transfer.  Name of family member notified:  [name], niece     PHYSICIAN Please prepare priority  discharge summary, including medications, Please prepare prescriptions, Please sign FL2     Additional Comment:    _______________________________________________ Abigail Butts, LCSW 01/12/2018, 2:33 PM

## 2018-01-12 NOTE — Progress Notes (Signed)
ID RN was consulted re: pos rt foot culture for MRSA (in June of this year prior to rt AKA.  Recommended reculture of existing wounds in rt stump and rt thigh.  Will notify MD and place patient on contact precautions

## 2018-01-12 NOTE — Progress Notes (Signed)
Report called to Owens Loffler, RN at [facility].  Positive previous culture of rt foot was reported to her.  VSS.

## 2018-01-12 NOTE — Consult Note (Signed)
Ref: Mila Palmer, MD   Subjective:  Awake. Afebrile. Monitor shows atrial fibrillation with controlled ventricular response.  Objective:  Vital Signs in the last 24 hours: Temp:  [97.5 F (36.4 C)-98.7 F (37.1 C)] 98.1 F (36.7 C) (09/05 0700) Pulse Rate:  [53-88] 77 (09/05 1000) Cardiac Rhythm: Atrial fibrillation (09/05 0700) Resp:  [16-18] 16 (09/05 0730) BP: (89-113)/(40-85) 89/40 (09/05 1000) SpO2:  [96 %-100 %] 96 % (09/05 0700) Weight:  [82.1 kg] 82.1 kg (09/05 0700)  Physical Exam: BP Readings from Last 1 Encounters:  01/12/18 (!) 89/40     Wt Readings from Last 1 Encounters:  01/12/18 82.1 kg    Weight change: -2 kg Body mass index is 32.06 kg/m. HEENT: [place]/AT, Eyes-Brown, PERL, EOMI, Conjunctiva-Pink, Sclera-Non-icteric Neck: No JVD, No bruit, Trachea midline. Lungs:  Clear, Bilateral. Cardiac:  Regular rhythm, normal S1 and S2, no S3. II/VI systolic murmur. Abdomen:  Soft, non-tender. BS present. Extremities:  No edema present. No cyanosis. No clubbing. Right BKA and Right IJ TDC. CNS: AxOx3, Cranial nerves grossly intact, moves all 4 extremities.  Skin: Warm and dry.   Intake/Output from previous day: 09/04 0701 - 09/05 0700 In: 300 [P.O.:300] Out: -     Lab Results: BMET    Component Value Date/Time   NA 133 (L) 01/12/2018 0513   NA 136 01/10/2018 0752   NA 137 01/09/2018 0300   NA 142 06/03/2014 1249   K 4.4 01/12/2018 0513   K 4.5 01/10/2018 0752   K 4.3 01/09/2018 0300   K 5.3 (H) 06/03/2014 1249   CL 97 (L) 01/12/2018 0513   CL 102 01/10/2018 0752   CL 99 01/09/2018 0300   CO2 23 01/12/2018 0513   CO2 23 01/10/2018 0752   CO2 27 01/09/2018 0300   CO2 25 06/03/2014 1249   GLUCOSE 94 01/12/2018 0513   GLUCOSE 124 (H) 01/10/2018 0752   GLUCOSE 131 (H) 01/09/2018 0300   GLUCOSE 107 06/03/2014 1249   BUN 51 (H) 01/12/2018 0513   BUN 68 (H) 01/10/2018 0752   BUN 46 (H) 01/09/2018 0300   BUN 75.0 (H) 06/03/2014 1249   CREATININE  6.59 (H) 01/12/2018 0513   CREATININE 7.86 (H) 01/10/2018 0752   CREATININE 6.20 (H) 01/09/2018 0300   CREATININE 12.5 Repeated and Verified (HH) 06/03/2014 1249   CALCIUM 8.3 (L) 01/12/2018 0513   CALCIUM 8.2 (L) 01/10/2018 0752   CALCIUM 8.5 (L) 01/09/2018 0300   CALCIUM 9.5 06/03/2014 1249   CALCIUM 8.9 12/05/2006 1357   GFRNONAA 5 (L) 01/12/2018 0513   GFRNONAA 4 (L) 01/10/2018 0752   GFRNONAA 6 (L) 01/09/2018 0300   GFRAA 6 (L) 01/12/2018 0513   GFRAA 5 (L) 01/10/2018 0752   GFRAA 6 (L) 01/09/2018 0300   CBC    Component Value Date/Time   WBC 12.4 (H) 01/12/2018 0513   RBC 4.51 01/12/2018 0513   HGB 10.5 (L) 01/12/2018 0513   HGB 12.8 06/03/2014 1249   HCT 36.1 01/12/2018 0513   HCT 43.0 06/03/2014 1249   PLT 264 01/12/2018 0513   PLT 350 06/03/2014 1249   MCV 80.0 01/12/2018 0513   MCV 76.6 (L) 06/03/2014 1249   MCH 23.3 (L) 01/12/2018 0513   MCHC 29.1 (L) 01/12/2018 0513   RDW 23.0 (H) 01/12/2018 0513   RDW 19.8 (H) 06/03/2014 1249   LYMPHSABS 1.4 01/08/2018 0854   LYMPHSABS 1.6 06/03/2014 1249   MONOABS 0.5 01/08/2018 0854   MONOABS 0.7 06/03/2014 1249  EOSABS 0.2 01/08/2018 0854   EOSABS 0.1 06/03/2014 1249   BASOSABS 0.1 01/08/2018 0854   BASOSABS 0.1 06/03/2014 1249   HEPATIC Function Panel Recent Labs    01/08/18 0854 01/09/18 0300  PROT 7.2 6.2*   HEMOGLOBIN A1C No components found for: HGA1C,  MPG CARDIAC ENZYMES Lab Results  Component Value Date   TROPONINI <0.03 01/08/2018   TROPONINI <0.30 11/04/2012   TROPONINI <0.30 11/04/2012   BNP No results for input(s): PROBNP in the last 8760 hours. TSH No results for input(s): TSH in the last 8760 hours. CHOLESTEROL Recent Labs    01/09/18 0300  CHOL 77    Scheduled Meds: .  stroke: mapping our early stages of recovery book   Does not apply Once  . amiodarone  200 mg Oral BID  . apixaban  2.5 mg Oral BID  . Chlorhexidine Gluconate Cloth  6 each Topical Q0600  . Chlorhexidine Gluconate  Cloth  6 each Topical Q0600  . cinacalcet  30 mg Oral QPM  . digoxin  0.0625 mg Oral Daily  . feeding supplement (NEPRO CARB STEADY)  237 mL Oral Daily  . feeding supplement (PRO-STAT SUGAR FREE 64)  30 mL Oral Daily  . gabapentin  100 mg Oral QHS  . [START ON 01/13/2018] heparin  5,000 Units Dialysis Once in dialysis  . insulin aspart  0-9 Units Subcutaneous TID WC  . metoprolol tartrate  12.5 mg Oral BID  . midodrine      . midodrine  10 mg Oral BID WC  . multivitamin  1 tablet Oral QHS  . sevelamer carbonate  1,600 mg Oral With snacks  . sevelamer carbonate  2,400 mg Oral TID WC  . sodium phosphate  1 enema Rectal Once   Continuous Infusions: PRN Meds:.oxyCODONE-acetaminophen, senna-docusate  Assessment/Plan: Atrial fibrillation, CHA2DS2VASc score of 8 ESRD Type 2 DM Anemia of chronic disease Obesity Gout Stroke  Continue medical treatment.   LOS: 4 days    Orpah Cobb  MD  01/12/2018, 10:26 AM

## 2018-01-12 NOTE — Progress Notes (Signed)
[facility] Progress Note  Subjective: no new c/o today  Vitals:   01/12/18 0930 01/12/18 1000 01/12/18 1030 01/12/18 1100  BP: (!) 89/44 (!) 89/40 (!) 87/42 96/71  Pulse: 76 77 (!) 55 64  Resp:      Temp:      TempSrc:      SpO2:      Weight:        Inpatient medications: .  stroke: mapping our early stages of recovery book   Does not apply Once  . amiodarone  200 mg Oral BID  . apixaban  2.5 mg Oral BID  . Chlorhexidine Gluconate Cloth  6 each Topical Q0600  . Chlorhexidine Gluconate Cloth  6 each Topical Q0600  . cinacalcet  30 mg Oral QPM  . digoxin  0.0625 mg Oral Daily  . feeding supplement (NEPRO CARB STEADY)  237 mL Oral Daily  . feeding supplement (PRO-STAT SUGAR FREE 64)  30 mL Oral Daily  . gabapentin  100 mg Oral QHS  . [START ON 01/13/2018] heparin  5,000 Units Dialysis Once in dialysis  . insulin aspart  0-9 Units Subcutaneous TID WC  . metoprolol tartrate  12.5 mg Oral BID  . midodrine      . midodrine  10 mg Oral BID WC  . multivitamin  1 tablet Oral QHS  . sevelamer carbonate  1,600 mg Oral With snacks  . sevelamer carbonate  2,400 mg Oral TID WC  . sodium phosphate  1 enema Rectal Once    oxyCODONE-acetaminophen, senna-docusate  Iron/TIBC/Ferritin/ %Sat    Component Value Date/Time   IRON 26 (L) 12/19/2006 1455   TIBC 86 (L) 12/19/2006 1455   FERRITIN 1076 (H) 12/19/2006 1455   IRONPCTSAT 30 12/19/2006 1455   IRONPCTSAT 16 (L) 10/18/2005 4098    Exam:  obese, alert , on HD  no jvd  chest cta bilat  cor reg no mrg  abd soft ntnd no mass or ascites  ext R BKA stump wrapped, L foot bandaged, no edema  skin no rash  nonfocal, O x3    R IJ TDC  Dialysis: [facility]  4h  78kg  2/2.25 bath   Hep 5000  RIJ TDC ("Dr Imogene Burn felt no other Access available ")  - Mircera 150 mcg q 2wks (given 12/29/17  last op hgb 10.6 (01/05/18)   Impression: 1. ESRD -  HD TTS.  HD today.  2. A fib with RVR - BP's too low for dilt/ BB. Getting  amio/ apxiaban and digoxin. Heart rate control is much better.  Per primary/ cards 3. TIA - wu with Neuro consulting 4. HO CVA in past  5. Hypotension/ volume - BP's low here and at home. Started midodrine 10 bid here. 4kg up, max UF on HD today.  6. Anemia ckd  - HGB > 10 no esa needs this week ( Mircera given 12/29/17)  Fu hgb trend  7. Metabolic bone disease - Renvela binder , Sensipar  q day , no  Er Ca ok , no  vit d on hd  8. Nutrition - Renal /Carb  Diet / renal vit  9. HO  Recent R BKA / L foot diabetic wound - wound care per admit  10. HO DM type 2 - per admit   Plan - as above   Vinson Moselle MD [facility] pager [phone]   01/12/2018, 11:32 AM   Recent Labs  Lab 01/08/18 6213  01/10/18 0865 01/12/18 7846  NA 139   < > 136 133*  K 3.7   < > 4.5 4.4  CL 102   < > 102 97*  CO2 26   < > 23 23  GLUCOSE 181*   < > 124* 94  BUN 35*   < > 68* 51*  CREATININE 5.10*   < > 7.86* 6.59*  CALCIUM 9.0   < > 8.2* 8.3*  PHOS  --   --  5.9* 4.8*  ALBUMIN 3.1*   < > 2.6* 2.6*  INR 1.09  --   --   --    < > = values in this interval not displayed.   Recent Labs  Lab 01/08/18 0854 01/09/18 0300  AST 18 16  ALT 14 13  ALKPHOS 110 88  BILITOT 0.7 0.8  PROT 7.2 6.2*   Recent Labs  Lab 01/08/18 0854  01/11/18 0442 01/12/18 0513  WBC 10.2   < > 15.9* 12.4*  NEUTROABS 8.0*  --   --   --   HGB 11.2*   < > 11.7* 10.5*  HCT 41.2   < > 41.9 36.1  MCV 83.9   < > 81.5 80.0  PLT 284   < > 247 264   < > = values in this interval not displayed.

## 2018-01-12 NOTE — Care Management Note (Signed)
Case Management Note  Patient Details  Name: MECKENZIE KELLOG MRN: [id number] Date of Birth: 05-02-1933  Subjective/Objective:                    Action/Plan: Pt discharging back to [place] today. CM signing off.   Expected Discharge Date:  01/12/18               Expected Discharge Plan:  Skilled Nursing Facility  In-House Referral:  Clinical Social Work  Discharge planning Services     Post Acute Care Choice:    Choice offered to:     DME Arranged:    DME Agency:     HH Arranged:    HH Agency:     Status of Service:  Completed, signed off  If discussed at [employer], dates discussed:    Additional Comments:  Kermit Balo, RN 01/12/2018, 12:46 PM

## 2018-01-12 NOTE — Progress Notes (Signed)
Patient will discharge back to [facility]. Anticipated discharge date: 01/12/18 Family notified: [name], niece Transportation by: [facility]  Nurse to call report to [phone]. Patient will go to room 220 at the facility.   CSW signing off.  Abigail Butts, LCSWA  Clinical Social Worker

## 2018-01-12 NOTE — Progress Notes (Signed)
Dr. Clearnce Sorrel was notified of ID RN recommendation that patient's rt leg wounds be recultured  D/t pos MRSA on 10/11/17. He declined to reculture since pt was being discharged to return to [facility] today.

## 2018-01-12 NOTE — Discharge Summary (Addendum)
Physician Discharge Summary  [name] QIH:[id number] DOB: 01-Dec-1932 DOA: 01/08/2018  PCP: Mila Palmer, MD  Admit date: 01/08/2018 Discharge date: 01/12/2018  Admitted From: SNF Disposition:  SNF  Recommendations for Outpatient Follow-up:  1. Follow up with PCP in 1-2 weeks 2. Please obtain BMP/CBC in one week 3. Digoxin level in 3-5 days from discharge   Home Health:No Equipment/Devices:None  Discharge Condition: Stable CODE STATUS: Full Diet recommendation: Heart Healthy / Renal diet  Brief/Interim Summary:  #) TIA-like symptoms with history of CVA: Patient was admitted with right gaze deviation and left-sided weakness.  MRI brain and MRA of head neck showed no evidence of infarction.  Patient was noted to be in atrial fibrillation and this was felt to be likely secondary to her atrial fibrillation with cardioembolic TIA.  See below for atrial fibrillation plan.  Echo was performed that showed an EF of 45 to 50% with no wall motion of normalities.  Carotid ultrasound was done on 01/2018 showed only 1 to 39% stenosis of left ICA.  #) Atrial fibrillation with RVR complicated by hypotension: Patient's blood pressures were noted to be too soft to tolerate a barium blocker or calcium channel blocker.  She was admitted with atrial for ablation with RVR.  She was started on midodrine.  Cardiology was consulted and started patient on amiodarone as well as digoxin.  Her heart rate responded well.  She was started on apixaban 2.5 mill grams twice daily.  #) Type 2 diabetes: Patient was maintained on sliding scale insulin.  #) ESRD, gated by secondary hyperparathyroidism: She was continued on home sevelamer and cinacalcet.  She was started on midodrine due to hypotension to tolerate dialysis.  #) Peripheral vascular disease status post right BKA: Patient was continued on home clopidogrel.  #) Pain/psych: Patient was continued on home gabapentin.  Discharge Diagnoses:  Principal  Problem:   TIA (transient ischemic attack) Active Problems:   Pressure injury of skin   Gangrene [facility])    Discharge Instructions  Discharge Instructions    Call MD for:  difficulty breathing, headache or visual disturbances   Complete by:  As directed    Call MD for:  extreme fatigue   Complete by:  As directed    Call MD for:  hives   Complete by:  As directed    Call MD for:  persistant dizziness or light-headedness   Complete by:  As directed    Call MD for:  persistant nausea and vomiting   Complete by:  As directed    Call MD for:  redness, tenderness, or signs of infection (pain, swelling, redness, odor or green/yellow discharge around incision site)   Complete by:  As directed    Call MD for:  severe uncontrolled pain   Complete by:  As directed    Call MD for:  temperature >100.4   Complete by:  As directed    Diet - low sodium heart healthy   Complete by:  As directed    Discharge instructions   Complete by:  As directed    Please follow-up with your PCP in 1 week.  Please take your heart rate and blood thinner medications as prescribed until you see your cardiologist.   Increase activity slowly   Complete by:  As directed      Allergies as of 01/12/2018      Reactions   Codeine Hives   Vancomycin Rash      Medication List    TAKE these  medications   amiodarone 200 MG tablet Commonly known as:  PACERONE Take 1 tablet (200 mg total) by mouth 2 (two) times daily.   apixaban 2.5 MG Tabs tablet Commonly known as:  ELIQUIS Take 1 tablet (2.5 mg total) by mouth 2 (two) times daily.   clopidogrel 75 MG tablet Commonly known as:  PLAVIX Take 1 tablet (75 mg total) by mouth daily.   Digoxin 62.5 MCG Tabs Take 0.0625 mg by mouth daily.   feeding supplement (NEPRO CARB STEADY) Liqd Take 237 mLs by mouth daily.   feeding supplement (PRO-STAT SUGAR FREE 64) Liqd Take 30 mLs by mouth daily.   gabapentin 100 MG capsule Commonly known as:  NEURONTIN Take 100  mg by mouth at bedtime.   metoprolol tartrate 25 MG tablet Commonly known as:  LOPRESSOR Take 0.5 tablets (12.5 mg total) by mouth 2 (two) times daily.   midodrine 10 MG tablet Commonly known as:  PROAMATINE Take 1 tablet (10 mg total) by mouth 2 (two) times daily with a meal.   oxyCODONE-acetaminophen 5-325 MG tablet Commonly known as:  PERCOCET/ROXICET Take 1 tablet by mouth every 4 (four) hours as needed.   RENVELA 800 MG tablet Generic drug:  sevelamer carbonate Take 1,600-2,400 mg by mouth See admin instructions. Take 2400 mg by mouth 3 times daily with meals and take 1600 mg by mouth with snacks   SENSIPAR 30 MG tablet Generic drug:  cinacalcet Take 30 mg by mouth every evening.      Contact information for after-discharge care    Destination    [facility] .   Service:  Skilled Nursing Contact information: [address] [place] [address] [phone]             Allergies  Allergen Reactions  . Codeine Hives  . Vancomycin Rash    Consultations:  Cardiology, Dr.Kadakia   Procedures/Studies: Dg Chest 2 View  Result Date: 01/08/2018 CLINICAL DATA:  LEFT-sided weakness.  Concern for infarct EXAM: CHEST - 2 VIEW COMPARISON:  06/27/2017 FINDINGS: Large-bore central venous line again noted. Normal cardiac silhouette. Low lung volumes. No pulmonary edema. Degenerative spurring of the spine. IMPRESSION: Low lung volumes.  No acute findings. Electronically Signed   By: Genevive Bi M.D.   On: 01/08/2018 09:44   Ct Head Wo Contrast  Result Date: 01/08/2018 CLINICAL DATA:  Onset left-sided weakness this morning. EXAM: CT HEAD WITHOUT CONTRAST TECHNIQUE: Contiguous axial images were obtained from the base of the skull through the vertex without intravenous contrast. COMPARISON:  Head CT scan 07/08/2015. FINDINGS: Brain: No evidence of acute infarction, hemorrhage, hydrocephalus, extra-axial collection or mass  lesion/mass effect. Atrophy and extensive chronic microvascular ischemic change are again seen. Asymmetric atrophy of the right cerebellum relative to the left is chronic and unchanged. Vascular: Atherosclerosis noted. Skull: Intact.  No focal lesion. Sinuses/Orbits: Status post bilateral lens extraction. Otherwise negative. Other: None. IMPRESSION: No acute abnormality. Atrophy and extensive chronic microvascular ischemic change. Atherosclerosis. Electronically Signed   By: Drusilla Kanner M.D.   On: 01/08/2018 09:35   Mr Maxine Glenn Head Wo Contrast  Result Date: 01/08/2018 CLINICAL DATA:  TIA.  Diabetes EXAM: MRI HEAD WITHOUT CONTRAST MRA HEAD WITHOUT CONTRAST MRA NECK WITHOUT CONTRAST TECHNIQUE: Multiplanar, multiecho pulse sequences of the brain and surrounding structures were obtained without intravenous contrast. Angiographic images of the Circle of Willis were obtained using MRA technique without intravenous contrast. Angiographic images of the neck were obtained using MRA technique without  intravenous contrast. Carotid stenosis measurements (when applicable) are obtained utilizing NASCET criteria, using the distal internal carotid diameter as the denominator. COMPARISON:  CT head 01/08/2018 FINDINGS: MRI HEAD FINDINGS Brain: Negative for acute infarct. Moderate atrophy without hydrocephalus. Moderate chronic microvascular ischemic change throughout the white matter extending into the pons bilaterally and thalamus bilaterally. Chronic infarcts in the right brachium pontis and right cerebellum. Negative for hemorrhage or mass. Vascular: Normal arterial flow voids Skull and upper cervical spine: Diffuse cervical spondylosis. Extensive arthropathy C1-2 with prominent pannus posterior to the dens causing moderate spinal stenosis and cord flattening on the right. Question rheumatoid arthritis versus CPPD or osteoarthritis. Sinuses/Orbits: Paranasal sinuses clear.  Bilateral cataract surgery Other: None MRA HEAD  FINDINGS Both vertebral artery patent to the basilar. Basilar widely patent. Posterior cerebral arteries are patent bilaterally with mild atherosclerotic disease distally bilaterally. Internal carotid artery patent bilaterally with moderate stenosis in the cavernous carotid bilaterally right greater than left. Anterior and middle cerebral arteries patent bilaterally. Moderate stenosis M2 segments bilaterally. Anterior cerebral arteries patent bilaterally. Negative for aneurysm. MRA NECK FINDINGS Suboptimal image quality due to motion and lack of intravenous contrast. Antegrade flow in carotid and vertebral arteries bilaterally. Proximal vertebral arteries are not imaged on the study related to artifact. Mid and distal vertebral arteries widely patent Internal carotid artery patent bilaterally. No high-grade stenosis. Limited detail through the carotid bifurcation. IMPRESSION: 1. Negative for acute infarct. Generalized atrophy with extensive chronic ischemic changes 2. Cervical spondylosis. Extensive C1-2 arthropathy with prominent pannus formation posterior to the dens causing spinal stenosis. Question rheumatoid arthritis 3. Mild to moderate intracranial atherosclerotic disease. No large vessel occlusion 4. Suboptimal MRA neck due to motion and lack of contrast. No hemodynamically significant stenosis in the carotid or vertebral arteries as described above. Electronically Signed   By: Marlan Palau M.D.   On: 01/08/2018 12:58   Mr Maxine Glenn Neck Wo Contrast  Result Date: 01/08/2018 CLINICAL DATA:  TIA.  Diabetes EXAM: MRI HEAD WITHOUT CONTRAST MRA HEAD WITHOUT CONTRAST MRA NECK WITHOUT CONTRAST TECHNIQUE: Multiplanar, multiecho pulse sequences of the brain and surrounding structures were obtained without intravenous contrast. Angiographic images of the Circle of Willis were obtained using MRA technique without intravenous contrast. Angiographic images of the neck were obtained using MRA technique without intravenous  contrast. Carotid stenosis measurements (when applicable) are obtained utilizing NASCET criteria, using the distal internal carotid diameter as the denominator. COMPARISON:  CT head 01/08/2018 FINDINGS: MRI HEAD FINDINGS Brain: Negative for acute infarct. Moderate atrophy without hydrocephalus. Moderate chronic microvascular ischemic change throughout the white matter extending into the pons bilaterally and thalamus bilaterally. Chronic infarcts in the right brachium pontis and right cerebellum. Negative for hemorrhage or mass. Vascular: Normal arterial flow voids Skull and upper cervical spine: Diffuse cervical spondylosis. Extensive arthropathy C1-2 with prominent pannus posterior to the dens causing moderate spinal stenosis and cord flattening on the right. Question rheumatoid arthritis versus CPPD or osteoarthritis. Sinuses/Orbits: Paranasal sinuses clear.  Bilateral cataract surgery Other: None MRA HEAD FINDINGS Both vertebral artery patent to the basilar. Basilar widely patent. Posterior cerebral arteries are patent bilaterally with mild atherosclerotic disease distally bilaterally. Internal carotid artery patent bilaterally with moderate stenosis in the cavernous carotid bilaterally right greater than left. Anterior and middle cerebral arteries patent bilaterally. Moderate stenosis M2 segments bilaterally. Anterior cerebral arteries patent bilaterally. Negative for aneurysm. MRA NECK FINDINGS Suboptimal image quality due to motion and lack of intravenous contrast. Antegrade flow in carotid and vertebral arteries bilaterally. Proximal  vertebral arteries are not imaged on the study related to artifact. Mid and distal vertebral arteries widely patent Internal carotid artery patent bilaterally. No high-grade stenosis. Limited detail through the carotid bifurcation. IMPRESSION: 1. Negative for acute infarct. Generalized atrophy with extensive chronic ischemic changes 2. Cervical spondylosis. Extensive C1-2  arthropathy with prominent pannus formation posterior to the dens causing spinal stenosis. Question rheumatoid arthritis 3. Mild to moderate intracranial atherosclerotic disease. No large vessel occlusion 4. Suboptimal MRA neck due to motion and lack of contrast. No hemodynamically significant stenosis in the carotid or vertebral arteries as described above. Electronically Signed   By: Marlan Palau M.D.   On: 01/08/2018 12:58   Mr Brain Wo Contrast  Result Date: 01/08/2018 CLINICAL DATA:  TIA.  Diabetes EXAM: MRI HEAD WITHOUT CONTRAST MRA HEAD WITHOUT CONTRAST MRA NECK WITHOUT CONTRAST TECHNIQUE: Multiplanar, multiecho pulse sequences of the brain and surrounding structures were obtained without intravenous contrast. Angiographic images of the Circle of Willis were obtained using MRA technique without intravenous contrast. Angiographic images of the neck were obtained using MRA technique without intravenous contrast. Carotid stenosis measurements (when applicable) are obtained utilizing NASCET criteria, using the distal internal carotid diameter as the denominator. COMPARISON:  CT head 01/08/2018 FINDINGS: MRI HEAD FINDINGS Brain: Negative for acute infarct. Moderate atrophy without hydrocephalus. Moderate chronic microvascular ischemic change throughout the white matter extending into the pons bilaterally and thalamus bilaterally. Chronic infarcts in the right brachium pontis and right cerebellum. Negative for hemorrhage or mass. Vascular: Normal arterial flow voids Skull and upper cervical spine: Diffuse cervical spondylosis. Extensive arthropathy C1-2 with prominent pannus posterior to the dens causing moderate spinal stenosis and cord flattening on the right. Question rheumatoid arthritis versus CPPD or osteoarthritis. Sinuses/Orbits: Paranasal sinuses clear.  Bilateral cataract surgery Other: None MRA HEAD FINDINGS Both vertebral artery patent to the basilar. Basilar widely patent. Posterior cerebral  arteries are patent bilaterally with mild atherosclerotic disease distally bilaterally. Internal carotid artery patent bilaterally with moderate stenosis in the cavernous carotid bilaterally right greater than left. Anterior and middle cerebral arteries patent bilaterally. Moderate stenosis M2 segments bilaterally. Anterior cerebral arteries patent bilaterally. Negative for aneurysm. MRA NECK FINDINGS Suboptimal image quality due to motion and lack of intravenous contrast. Antegrade flow in carotid and vertebral arteries bilaterally. Proximal vertebral arteries are not imaged on the study related to artifact. Mid and distal vertebral arteries widely patent Internal carotid artery patent bilaterally. No high-grade stenosis. Limited detail through the carotid bifurcation. IMPRESSION: 1. Negative for acute infarct. Generalized atrophy with extensive chronic ischemic changes 2. Cervical spondylosis. Extensive C1-2 arthropathy with prominent pannus formation posterior to the dens causing spinal stenosis. Question rheumatoid arthritis 3. Mild to moderate intracranial atherosclerotic disease. No large vessel occlusion 4. Suboptimal MRA neck due to motion and lack of contrast. No hemodynamically significant stenosis in the carotid or vertebral arteries as described above. Electronically Signed   By: Marlan Palau M.D.   On: 01/08/2018 12:58   Dg Foot Complete Left  Result Date: 12/19/2017 Please see detailed radiograph report in office note.  Echo 01/10/2018: - Left ventricle: The cavity size was normal. There was mild concentric hypertrophy. Systolic function was mildly reduced. The estimated ejection fraction was in the range of 45% to 50%. There is mild hypokinesis of the entire myocardium. - Aortic valve: Mild focal calcification involving the left coronary cusp. - Mitral valve: Calcified annulus. - Left atrium: The atrium was moderately dilated. - Right ventricle: Systolic function was moderately  reduced. -  Right atrium: The atrium was mildly dilated. - Tricuspid valve: There was moderate regurgitation. - Pulmonary arteries: Systolic pressure was mildly increased. PA  peak pressure: 41 mm Hg (S).  Carotid ultrasound 01/2018: Final Interpretation: Right Carotid: Velocities in the right ICA are consistent with a 1-39% stenosis.  Left Carotid: Velocities in the left ICA are consistent with a 1-39% stenosis.  Vertebrals: Bilateral vertebral arteries demonstrate antegrade flow. Subclavians: Normal flow hemodynamics were seen in the left subclavian artery.       Unable to visualize right subclavian secondary to bandages.  Subjective:   Discharge Exam: Vitals:   01/12/18 1100 01/12/18 1112  BP: 96/71 (!) 94/44  Pulse: 64 84  Resp:  16  Temp:  97.6 F (36.4 C)  SpO2:     Vitals:   01/12/18 1000 01/12/18 1030 01/12/18 1100 01/12/18 1112  BP: (!) 89/40 (!) 87/42 96/71 (!) 94/44  Pulse: 77 (!) 55 64 84  Resp:    16  Temp:    97.6 F (36.4 C)  TempSrc:    Oral  SpO2:      Weight:    78.7 kg    General exam: Appears calm and comfortable  Respiratory system: Clear to auscultation. Respiratory effort normal. Cardiovascular system: 2 out of 6 systolic murmur, irregularly irregular Gastrointestinal system: Abdomen is nondistended, soft and nontender. No organomegaly or masses felt. Normal bowel sounds heard. Central nervous system: Alert and oriented.   Edentulous, difficult to understand, right-sided weakness Extremities: No lower extremity edema, old fistula sites in left upper extremity, Skin: Catheter site in right chest is clean dry and intact Psychiatry: Judgement and insight appear normal. Mood & affect appropriate.      The results of significant diagnostics from this hospitalization (including imaging, microbiology, ancillary and laboratory) are listed below for reference.     Microbiology: Recent Results (from the past 240 hour(s))  MRSA PCR  Screening     Status: None   Collection Time: 01/08/18 12:19 PM  Result Value Ref Range Status   MRSA by PCR NEGATIVE NEGATIVE Final    Comment:        The GeneXpert MRSA Assay (FDA approved for NASAL specimens only), is one component of a comprehensive MRSA colonization surveillance program. It is not intended to diagnose MRSA infection nor to guide or monitor treatment for MRSA infections. Performed at [facility], [address] [place], [place] [address]      Labs: BNP (last 3 results) No results for input(s): BNP in the last 8760 hours. Basic Metabolic Panel: Recent Labs  Lab 01/08/18 0854 01/08/18 0901 01/09/18 0300 01/10/18 0752 01/12/18 0513  NA 139 140 137 136 133*  K 3.7 3.5 4.3 4.5 4.4  CL 102 101 99 102 97*  CO2 26  --  27 23 23   GLUCOSE 181* 185* 131* 124* 94  BUN 35* 36* 46* 68* 51*  CREATININE 5.10* 5.30* 6.20* 7.86* 6.59*  CALCIUM 9.0  --  8.5* 8.2* 8.3*  MG  --   --   --   --  2.1  PHOS  --   --   --  5.9* 4.8*   Liver Function Tests: Recent Labs  Lab 01/08/18 0854 01/09/18 0300 01/10/18 0752 01/12/18 0513  AST 18 16  --   --   ALT 14 13  --   --   ALKPHOS 110 88  --   --   BILITOT 0.7 0.8  --   --  PROT 7.2 6.2*  --   --   ALBUMIN 3.1* 2.8* 2.6* 2.6*   No results for input(s): LIPASE, AMYLASE in the last 168 hours. No results for input(s): AMMONIA in the last 168 hours. CBC: Recent Labs  Lab 01/08/18 0854 01/08/18 0901 01/09/18 0300 01/10/18 0452 01/11/18 0442 01/12/18 0513  WBC 10.2  --  10.9* 10.4 15.9* 12.4*  NEUTROABS 8.0*  --   --   --   --   --   HGB 11.2* 14.3 10.6* 10.4* 11.7* 10.5*  HCT 41.2 42.0 37.7 36.4 41.9 36.1  MCV 83.9  --  81.3 81.3 81.5 80.0  PLT 284  --  271 257 247 264   Cardiac Enzymes: Recent Labs  Lab 01/08/18 1343  TROPONINI <0.03   BNP: Invalid input(s): POCBNP CBG: Recent Labs  Lab 01/11/18 1213 01/11/18 1618 01/11/18 2203 01/12/18 0636 01/12/18 1023  GLUCAP 95 143* 205* 83  119*   D-Dimer No results for input(s): DDIMER in the last 72 hours. Hgb A1c No results for input(s): HGBA1C in the last 72 hours. Lipid Profile No results for input(s): CHOL, HDL, LDLCALC, TRIG, CHOLHDL, LDLDIRECT in the last 72 hours. Thyroid function studies No results for input(s): TSH, T4TOTAL, T3FREE, THYROIDAB in the last 72 hours.  Invalid input(s): FREET3 Anemia work up No results for input(s): VITAMINB12, FOLATE, FERRITIN, TIBC, IRON, RETICCTPCT in the last 72 hours. Urinalysis    Component Value Date/Time   COLORURINE YELLOW 12/17/2006 0005   APPEARANCEUR TURBID (A) 12/17/2006 0005   LABSPEC 1.019 12/17/2006 0005   PHURINE 5.0 12/17/2006 0005   GLUCOSEU NEGATIVE 12/17/2006 0005   HGBUR MODERATE (A) 12/17/2006 0005   BILIRUBINUR SMALL (A) 12/17/2006 0005   KETONESUR 15 (A) 12/17/2006 0005   PROTEINUR NEGATIVE 12/17/2006 0005   UROBILINOGEN 0.2 12/17/2006 0005   NITRITE NEGATIVE 12/17/2006 0005   LEUKOCYTESUR LARGE (A) 12/17/2006 0005   Sepsis Labs Invalid input(s): PROCALCITONIN,  WBC,  LACTICIDVEN Microbiology Recent Results (from the past 240 hour(s))  MRSA PCR Screening     Status: None   Collection Time: 01/08/18 12:19 PM  Result Value Ref Range Status   MRSA by PCR NEGATIVE NEGATIVE Final    Comment:        The GeneXpert MRSA Assay (FDA approved for NASAL specimens only), is one component of a comprehensive MRSA colonization surveillance program. It is not intended to diagnose MRSA infection nor to guide or monitor treatment for MRSA infections. Performed at [facility], [address], [place], [place] [address]      Time coordinating discharge: Over 30 minutes  SIGNED:   Delaine Lame, MD  [facility] 01/12/2018, 12:30 PM   If 7PM-7AM, please contact night-coverage [email or web address] Password [email or web address]

## 2018-01-13 ENCOUNTER — Encounter: Payer: Self-pay | Admitting: Adult Health

## 2018-01-13 ENCOUNTER — Telehealth: Payer: Self-pay | Admitting: *Deleted

## 2018-01-13 ENCOUNTER — Ambulatory Visit (INDEPENDENT_AMBULATORY_CARE_PROVIDER_SITE_OTHER): Payer: Medicare Other | Admitting: Podiatry

## 2018-01-13 ENCOUNTER — Non-Acute Institutional Stay (SKILLED_NURSING_FACILITY): Payer: Medicare Other | Admitting: Adult Health

## 2018-01-13 DIAGNOSIS — I482 Chronic atrial fibrillation: Secondary | ICD-10-CM | POA: Diagnosis not present

## 2018-01-13 DIAGNOSIS — I9589 Other hypotension: Secondary | ICD-10-CM | POA: Diagnosis not present

## 2018-01-13 DIAGNOSIS — G459 Transient cerebral ischemic attack, unspecified: Secondary | ICD-10-CM | POA: Diagnosis not present

## 2018-01-13 DIAGNOSIS — I70201 Unspecified atherosclerosis of native arteries of extremities, right leg: Secondary | ICD-10-CM

## 2018-01-13 DIAGNOSIS — N186 End stage renal disease: Secondary | ICD-10-CM

## 2018-01-13 DIAGNOSIS — L97409 Non-pressure chronic ulcer of unspecified heel and midfoot with unspecified severity: Secondary | ICD-10-CM | POA: Diagnosis not present

## 2018-01-13 DIAGNOSIS — Z992 Dependence on renal dialysis: Secondary | ICD-10-CM | POA: Diagnosis not present

## 2018-01-13 DIAGNOSIS — I4821 Permanent atrial fibrillation: Secondary | ICD-10-CM

## 2018-01-13 DIAGNOSIS — I7025 Atherosclerosis of native arteries of other extremities with ulceration: Secondary | ICD-10-CM | POA: Diagnosis not present

## 2018-01-13 NOTE — Progress Notes (Signed)
Location:  [facility] Room Number: 220-A Place of Service:  SNF (31) Provider:  Kenard Gower, NP  Patient Care Team: Mila Palmer, MD as PCP - General (Family Medicine) Vivi Barrack, DPM as Consulting Physician (Podiatry) Terrial Rhodes, MD as Consulting Physician (Nephrology)  Extended Emergency Contact Information Primary Emergency Contact: Harris,Myrtle Address: [address]          [place], [place] Home Phone: [phone] Relation: Niece Secondary Emergency Contact: Mamie [place] Home Phone: [phone] Mobile Phone: [phone] Relation: Son  Code Status:  Full Code  Goals of care: Advanced Directive information Advanced Directives 01/09/2018  Does Patient Have a Medical Advance Directive? Yes  Type of Advance Directive Healthcare Power of Attorney  Does patient want to make changes to medical advance directive? No - Patient declined  Copy of Healthcare Power of Attorney in Chart? No - copy requested  Would patient like information on creating a medical advance directive? -  Pre-existing out of facility DNR order (yellow form or pink MOST form) -     Chief Complaint  Patient presents with  . Acute Visit    The patient is seen for hospital followup, status post hospitalization at [facility] 9/1-01/12/18 for a TIA.     HPI:  Pt is an 84 y.o. female seen today for hospital follow-up.  She was readmitted to [facility] on 01/12/18 for short-term rehabilitation, status post admission at [facility] 9/1-01/12/18 for a TIA-like symptoms with history of CVA. She had right gaze and left-sided weakness. MRI brain and MRA of head neck showed no evidence of infarction. She was noted to be in atrial fibrillation and symptoms was felt to be likely secondary to her atrial fibrillation with cardioembolic TIA. Echo showed an EF of 45-50%, Carotid ultrasound showed only 1-39% stenosis of left  ICA. She was started on midodrine due to hypotension. Cardiology was consulted nd she was started on amiodarone, digoxin and Apixaban. She has a PMH of PAD, diabetes, stroke, OA, secondary hyperparathyroidism, history of gout, GERD, depression, and history of C. difficile diarrhea.     Past Medical History:  Diagnosis Date  . Anemia   . C. difficile diarrhea   . Chronic kidney disease    on Dialysis  . Depression   . Diabetes mellitus   . Diabetic neuropathy (HCC)   . Gangrene (HCC)    right foot  . GERD (gastroesophageal reflux disease)   . Gout   . Hyperparathyroidism   . MRSA (methicillin resistant staph aureus) culture positive   . Obesity   . Osteoarthritis   . Peripheral arterial disease (HCC)    Gangrene-Left Great Toe  . Shortness of breath dyspnea    with exertion  . Stroke [facility])    Past Surgical History:  Procedure Laterality Date  . ABDOMINAL AORTOGRAM N/A 07/05/2016   Procedure: Abdominal Aortogram;  Surgeon: Maeola Harman, MD;  Location: [facility] INVASIVE CV LAB;  Service: Cardiovascular;  Laterality: N/A;  . ABDOMINAL AORTOGRAM N/A 06/24/2017   Procedure: ABDOMINAL AORTOGRAM;  Surgeon: Sherren Kerns, MD;  Location: [facility] INVASIVE CV LAB;  Service: Cardiovascular;  Laterality: N/A;  . ABDOMINAL HYSTERECTOMY    . AMPUTATION Right 11/11/2017   Procedure: RIGHT BELOW KNEE AMPUTATION;  Surgeon: Nadara Mustard, MD;  Location: [facility] OR;  Service: Orthopedics;  Laterality: Right;  . AV FISTULA PLACEMENT Left 07/07/2015   Procedure: ATTEMPTED INSERTION OFLEFT  ARTERIOVENOUS (AV) GORE-TEX GRAFT THIGH;  Surgeon: Fransisco Hertz, MD;  Location: [facility] OR;  Service: Vascular;  Laterality: Left;  . BELOW KNEE LEG AMPUTATION Right 11/11/2017  . BREAST EXCISIONAL BIOPSY Right 05/25/2006  . COLONOSCOPY    . LOWER EXTREMITY ANGIOGRAPHY Bilateral 07/05/2016   Procedure: Lower Extremity Angiography;  Surgeon: Maeola Harman, MD;  Location: [facility] INVASIVE CV LAB;  Service:  Cardiovascular;  Laterality: Bilateral;  . LOWER EXTREMITY ANGIOGRAPHY Bilateral 06/24/2017   Procedure: Lower Extremity Angiography;  Surgeon: Sherren Kerns, MD;  Location: [facility] INVASIVE CV LAB;  Service: Cardiovascular;  Laterality: Bilateral;  . MULTIPLE TOOTH EXTRACTIONS    . PERIPHERAL VASCULAR INTERVENTION Right 06/24/2017   Procedure: PERIPHERAL VASCULAR INTERVENTION;  Surgeon: Sherren Kerns, MD;  Location: [facility] INVASIVE CV LAB;  Service: Cardiovascular;  Laterality: Right;  POPLITEALPTA SFA STENT  . SHUNTOGRAM Right 09/06/12   Thigh graft  . SP DECLOT AVGG Right Sep 14, 2012   Right thigh graft    Allergies  Allergen Reactions  . Codeine Hives  . Vancomycin Rash    Outpatient Encounter Medications as of 01/13/2018  Medication Sig  . Amino Acids-Protein Hydrolys (FEEDING SUPPLEMENT, PRO-STAT SUGAR FREE 64,) LIQD Take 30 mLs by mouth daily.   Marland Kitchen amiodarone (PACERONE) 200 MG tablet Take 1 tablet (200 mg total) by mouth 2 (two) times daily.  Marland Kitchen apixaban (ELIQUIS) 2.5 MG TABS tablet Take 1 tablet (2.5 mg total) by mouth 2 (two) times daily.  . clopidogrel (PLAVIX) 75 MG tablet Take 1 tablet (75 mg total) by mouth daily.  . digoxin 62.5 MCG TABS Take 0.0625 mg by mouth daily.  Marland Kitchen gabapentin (NEURONTIN) 100 MG capsule Take 100 mg by mouth at bedtime.   . metoprolol tartrate (LOPRESSOR) 25 MG tablet Take 0.5 tablets (12.5 mg total) by mouth 2 (two) times daily.  . midodrine (PROAMATINE) 10 MG tablet Take 1 tablet (10 mg total) by mouth 2 (two) times daily with a meal.  . Nutritional Supplements (FEEDING SUPPLEMENT, NEPRO CARB STEADY,) LIQD Take 237 mLs by mouth daily.  Marland Kitchen oxyCODONE-acetaminophen (PERCOCET/ROXICET) 5-325 MG tablet Take 1 tablet by mouth every 4 (four) hours as needed.  Marland Kitchen RENVELA 800 MG tablet Take 1,600-2,400 mg by mouth See admin instructions. Take 2400 mg by mouth 3 times daily with meals and take 1600 mg by mouth with snacks  . SENSIPAR 30 MG tablet Take 30 mg by mouth  every evening.    Facility-Administered Encounter Medications as of 01/13/2018  Medication  . protamine injection 25 mg    Review of Systems  GENERAL: No change in appetite, no fatigue, no weight changes, no fever, chills or weakness MOUTH and THROAT: Denies oral discomfort, gingival pain or bleeding RESPIRATORY: no cough, SOB, DOE, wheezing, hemoptysis CARDIAC: No chest pain, edema or palpitations GI: No abdominal pain, diarrhea, constipation, heart burn, nausea or vomiting PSYCHIATRIC: Denies feelings of depression or anxiety. No report of hallucinations, insomnia, paranoia, or agitation   Immunization History  Administered Date(s) Administered  . Influenza Split 04/03/2014   Pertinent  Health Maintenance Due  Topic Date Due  . FOOT EXAM  10/08/1942  . OPHTHALMOLOGY EXAM  10/08/1942  . URINE MICROALBUMIN  10/08/1942  . DEXA SCAN  10/09/1999  . PNA vac Low Risk Adult (1 of 2 - PCV13) 10/09/1999  . INFLUENZA VACCINE  12/08/2017  . HEMOGLOBIN A1C  07/10/2018   Fall Risk  07/11/2017 06/03/2014  Falls in the past year? No No     Vitals:   01/13/18 1039  BP: 118/64  Pulse: 84  Resp: 20  Temp: 98.7 F (37.1 C)  TempSrc: Oral  SpO2: 96%  Weight: 173 lb 8 oz (78.7 kg)  Height: 5\' 3"  (1.6 m)   Body mass index is 30.73 kg/m.  Physical Exam  GENERAL APPEARANCE: Well nourished. In no acute distress. Obese SKIN:  Left heel wound with dressing MOUTH and THROAT: Lips are without lesions. Oral mucosa is moist and without lesions.  RESPIRATORY: Breathing is even & unlabored, BS CTAB CARDIAC: Irregularly irregular, + murmur,no extra heart sounds, no edema GI: Abdomen soft, normal BS, no masses, no tenderness EXTREMITIES:  Able to move X 4 extremities, Right BKA PSYCHIATRIC: Alert and oriented X 3. Affect and behavior are appropriate   Labs reviewed: Recent Labs    11/15/17 0734  01/09/18 0300 01/10/18 0752 01/12/18 0513  NA 135   < > 137 136 133*  K 4.2   < > 4.3 4.5  4.4  CL 97*   < > 99 102 97*  CO2 27   < > 27 23 23   GLUCOSE 117*   < > 131* 124* 94  BUN 42*   < > 46* 68* 51*  CREATININE 8.88*   < > 6.20* 7.86* 6.59*  CALCIUM 7.9*   < > 8.5* 8.2* 8.3*  MG  --   --   --   --  2.1  PHOS 4.9*  --   --  5.9* 4.8*   < > = values in this interval not displayed.   Recent Labs    01/08/18 0854 01/09/18 0300 01/10/18 0752 01/12/18 0513  AST 18 16  --   --   ALT 14 13  --   --   ALKPHOS 110 88  --   --   BILITOT 0.7 0.8  --   --   PROT 7.2 6.2*  --   --   ALBUMIN 3.1* 2.8* 2.6* 2.6*   Recent Labs    01/08/18 0854  01/10/18 0452 01/11/18 0442 01/12/18 0513  WBC 10.2   < > 10.4 15.9* 12.4*  NEUTROABS 8.0*  --   --   --   --   HGB 11.2*   < > 10.4* 11.7* 10.5*  HCT 41.2   < > 36.4 41.9 36.1  MCV 83.9   < > 81.3 81.5 80.0  PLT 284   < > 257 247 264   < > = values in this interval not displayed.   Lab Results  Component Value Date   TSH 2.692 Test methodology is 3rd generation TSH 11/09/2006   Lab Results  Component Value Date   HGBA1C 5.5 01/09/2018   Lab Results  Component Value Date   CHOL 77 01/09/2018   HDL 46 01/09/2018   LDLCALC 22 01/09/2018   TRIG 44 01/09/2018   CHOLHDL 1.7 01/09/2018    Significant Diagnostic Results in last 30 days:  Dg Chest 2 View  Result Date: 01/08/2018 CLINICAL DATA:  LEFT-sided weakness.  Concern for infarct EXAM: CHEST - 2 VIEW COMPARISON:  06/27/2017 FINDINGS: Large-bore central venous line again noted. Normal cardiac silhouette. Low lung volumes. No pulmonary edema. Degenerative spurring of the spine. IMPRESSION: Low lung volumes.  No acute findings. Electronically Signed   By: Genevive Bi M.D.   On: 01/08/2018 09:44   Ct Head Wo Contrast  Result Date: 01/08/2018 CLINICAL DATA:  Onset left-sided weakness this morning. EXAM: CT HEAD WITHOUT CONTRAST TECHNIQUE: Contiguous axial images were obtained from the base  of the skull through the vertex without intravenous contrast. COMPARISON:  Head CT  scan 07/08/2015. FINDINGS: Brain: No evidence of acute infarction, hemorrhage, hydrocephalus, extra-axial collection or mass lesion/mass effect. Atrophy and extensive chronic microvascular ischemic change are again seen. Asymmetric atrophy of the right cerebellum relative to the left is chronic and unchanged. Vascular: Atherosclerosis noted. Skull: Intact.  No focal lesion. Sinuses/Orbits: Status post bilateral lens extraction. Otherwise negative. Other: None. IMPRESSION: No acute abnormality. Atrophy and extensive chronic microvascular ischemic change. Atherosclerosis. Electronically Signed   By: Drusilla Kanner M.D.   On: 01/08/2018 09:35   Mr Maxine Glenn Head Wo Contrast  Result Date: 01/08/2018 CLINICAL DATA:  TIA.  Diabetes EXAM: MRI HEAD WITHOUT CONTRAST MRA HEAD WITHOUT CONTRAST MRA NECK WITHOUT CONTRAST TECHNIQUE: Multiplanar, multiecho pulse sequences of the brain and surrounding structures were obtained without intravenous contrast. Angiographic images of the Circle of Willis were obtained using MRA technique without intravenous contrast. Angiographic images of the neck were obtained using MRA technique without intravenous contrast. Carotid stenosis measurements (when applicable) are obtained utilizing NASCET criteria, using the distal internal carotid diameter as the denominator. COMPARISON:  CT head 01/08/2018 FINDINGS: MRI HEAD FINDINGS Brain: Negative for acute infarct. Moderate atrophy without hydrocephalus. Moderate chronic microvascular ischemic change throughout the white matter extending into the pons bilaterally and thalamus bilaterally. Chronic infarcts in the right brachium pontis and right cerebellum. Negative for hemorrhage or mass. Vascular: Normal arterial flow voids Skull and upper cervical spine: Diffuse cervical spondylosis. Extensive arthropathy C1-2 with prominent pannus posterior to the dens causing moderate spinal stenosis and cord flattening on the right. Question rheumatoid arthritis  versus CPPD or osteoarthritis. Sinuses/Orbits: Paranasal sinuses clear.  Bilateral cataract surgery Other: None MRA HEAD FINDINGS Both vertebral artery patent to the basilar. Basilar widely patent. Posterior cerebral arteries are patent bilaterally with mild atherosclerotic disease distally bilaterally. Internal carotid artery patent bilaterally with moderate stenosis in the cavernous carotid bilaterally right greater than left. Anterior and middle cerebral arteries patent bilaterally. Moderate stenosis M2 segments bilaterally. Anterior cerebral arteries patent bilaterally. Negative for aneurysm. MRA NECK FINDINGS Suboptimal image quality due to motion and lack of intravenous contrast. Antegrade flow in carotid and vertebral arteries bilaterally. Proximal vertebral arteries are not imaged on the study related to artifact. Mid and distal vertebral arteries widely patent Internal carotid artery patent bilaterally. No high-grade stenosis. Limited detail through the carotid bifurcation. IMPRESSION: 1. Negative for acute infarct. Generalized atrophy with extensive chronic ischemic changes 2. Cervical spondylosis. Extensive C1-2 arthropathy with prominent pannus formation posterior to the dens causing spinal stenosis. Question rheumatoid arthritis 3. Mild to moderate intracranial atherosclerotic disease. No large vessel occlusion 4. Suboptimal MRA neck due to motion and lack of contrast. No hemodynamically significant stenosis in the carotid or vertebral arteries as described above. Electronically Signed   By: Marlan Palau M.D.   On: 01/08/2018 12:58   Mr Maxine Glenn Neck Wo Contrast  Result Date: 01/08/2018 CLINICAL DATA:  TIA.  Diabetes EXAM: MRI HEAD WITHOUT CONTRAST MRA HEAD WITHOUT CONTRAST MRA NECK WITHOUT CONTRAST TECHNIQUE: Multiplanar, multiecho pulse sequences of the brain and surrounding structures were obtained without intravenous contrast. Angiographic images of the Circle of Willis were obtained using MRA  technique without intravenous contrast. Angiographic images of the neck were obtained using MRA technique without intravenous contrast. Carotid stenosis measurements (when applicable) are obtained utilizing NASCET criteria, using the distal internal carotid diameter as the denominator. COMPARISON:  CT head 01/08/2018 FINDINGS: MRI HEAD FINDINGS Brain: Negative for acute  infarct. Moderate atrophy without hydrocephalus. Moderate chronic microvascular ischemic change throughout the white matter extending into the pons bilaterally and thalamus bilaterally. Chronic infarcts in the right brachium pontis and right cerebellum. Negative for hemorrhage or mass. Vascular: Normal arterial flow voids Skull and upper cervical spine: Diffuse cervical spondylosis. Extensive arthropathy C1-2 with prominent pannus posterior to the dens causing moderate spinal stenosis and cord flattening on the right. Question rheumatoid arthritis versus CPPD or osteoarthritis. Sinuses/Orbits: Paranasal sinuses clear.  Bilateral cataract surgery Other: None MRA HEAD FINDINGS Both vertebral artery patent to the basilar. Basilar widely patent. Posterior cerebral arteries are patent bilaterally with mild atherosclerotic disease distally bilaterally. Internal carotid artery patent bilaterally with moderate stenosis in the cavernous carotid bilaterally right greater than left. Anterior and middle cerebral arteries patent bilaterally. Moderate stenosis M2 segments bilaterally. Anterior cerebral arteries patent bilaterally. Negative for aneurysm. MRA NECK FINDINGS Suboptimal image quality due to motion and lack of intravenous contrast. Antegrade flow in carotid and vertebral arteries bilaterally. Proximal vertebral arteries are not imaged on the study related to artifact. Mid and distal vertebral arteries widely patent Internal carotid artery patent bilaterally. No high-grade stenosis. Limited detail through the carotid bifurcation. IMPRESSION: 1. Negative  for acute infarct. Generalized atrophy with extensive chronic ischemic changes 2. Cervical spondylosis. Extensive C1-2 arthropathy with prominent pannus formation posterior to the dens causing spinal stenosis. Question rheumatoid arthritis 3. Mild to moderate intracranial atherosclerotic disease. No large vessel occlusion 4. Suboptimal MRA neck due to motion and lack of contrast. No hemodynamically significant stenosis in the carotid or vertebral arteries as described above. Electronically Signed   By: Marlan Palau M.D.   On: 01/08/2018 12:58   Mr Brain Wo Contrast  Result Date: 01/08/2018 CLINICAL DATA:  TIA.  Diabetes EXAM: MRI HEAD WITHOUT CONTRAST MRA HEAD WITHOUT CONTRAST MRA NECK WITHOUT CONTRAST TECHNIQUE: Multiplanar, multiecho pulse sequences of the brain and surrounding structures were obtained without intravenous contrast. Angiographic images of the Circle of Willis were obtained using MRA technique without intravenous contrast. Angiographic images of the neck were obtained using MRA technique without intravenous contrast. Carotid stenosis measurements (when applicable) are obtained utilizing NASCET criteria, using the distal internal carotid diameter as the denominator. COMPARISON:  CT head 01/08/2018 FINDINGS: MRI HEAD FINDINGS Brain: Negative for acute infarct. Moderate atrophy without hydrocephalus. Moderate chronic microvascular ischemic change throughout the white matter extending into the pons bilaterally and thalamus bilaterally. Chronic infarcts in the right brachium pontis and right cerebellum. Negative for hemorrhage or mass. Vascular: Normal arterial flow voids Skull and upper cervical spine: Diffuse cervical spondylosis. Extensive arthropathy C1-2 with prominent pannus posterior to the dens causing moderate spinal stenosis and cord flattening on the right. Question rheumatoid arthritis versus CPPD or osteoarthritis. Sinuses/Orbits: Paranasal sinuses clear.  Bilateral cataract surgery  Other: None MRA HEAD FINDINGS Both vertebral artery patent to the basilar. Basilar widely patent. Posterior cerebral arteries are patent bilaterally with mild atherosclerotic disease distally bilaterally. Internal carotid artery patent bilaterally with moderate stenosis in the cavernous carotid bilaterally right greater than left. Anterior and middle cerebral arteries patent bilaterally. Moderate stenosis M2 segments bilaterally. Anterior cerebral arteries patent bilaterally. Negative for aneurysm. MRA NECK FINDINGS Suboptimal image quality due to motion and lack of intravenous contrast. Antegrade flow in carotid and vertebral arteries bilaterally. Proximal vertebral arteries are not imaged on the study related to artifact. Mid and distal vertebral arteries widely patent Internal carotid artery patent bilaterally. No high-grade stenosis. Limited detail through the carotid bifurcation. IMPRESSION: 1. Negative  for acute infarct. Generalized atrophy with extensive chronic ischemic changes 2. Cervical spondylosis. Extensive C1-2 arthropathy with prominent pannus formation posterior to the dens causing spinal stenosis. Question rheumatoid arthritis 3. Mild to moderate intracranial atherosclerotic disease. No large vessel occlusion 4. Suboptimal MRA neck due to motion and lack of contrast. No hemodynamically significant stenosis in the carotid or vertebral arteries as described above. Electronically Signed   By: Marlan Palau M.D.   On: 01/08/2018 12:58   Dg Foot Complete Left  Result Date: 12/19/2017 Please see detailed radiograph report in office note.   Assessment/Plan  1. TIA (transient ischemic attack) - MRI brain and MRA of head neck showed no evidence of infarction. She was noted to be in atrial fibrillation and symptoms was felt to be likely secondary to her atrial fibrillation with cardioembolic TIA, Plavix 75 mg 1 tab daily,  PT and OT for therapeutic strengthening exercises   2. Permanent atrial  fibrillation (HCC) - rate controlled, Cardiology was consulted nd she was started on amiodarone, digoxin and Apixaban  3. ESRD on dialysis [facility]) - continue hemodialysis, 1500 mL/day fluid restriction, renvela 800 mg 2 tabs = 600 mg with snacks,  3 tabs - 2,400 mg TID with meals, Cinacalcet 30 mg 1 tab daily evening   4. Hypotension - continue midodrine 10 mg twice a day   Family/ staff Communication: Discussed plan of care with resident.  Labs/tests ordered:  None  Goals of care:   Short-term rehabilitation.    Kenard Gower, NP [facility] [phone] (Monday-Friday 8:00 a.m. - 5:00 p.m.) [phone] (after hours)

## 2018-01-13 NOTE — Telephone Encounter (Signed)
[name] - [facility] scheduled pt for 01/20/2018 arrive 1:45pm consultation 2:00pm.

## 2018-01-13 NOTE — Telephone Encounter (Signed)
Unable to leave a message on home phone of [name], voicemail box is not set up.

## 2018-01-13 NOTE — Telephone Encounter (Signed)
I informed Lucile Crater of pt's 01/20/2018 [facility] appt. [name] states understanding.

## 2018-01-14 DIAGNOSIS — E1129 Type 2 diabetes mellitus with other diabetic kidney complication: Secondary | ICD-10-CM | POA: Diagnosis not present

## 2018-01-14 DIAGNOSIS — N186 End stage renal disease: Secondary | ICD-10-CM | POA: Diagnosis not present

## 2018-01-14 DIAGNOSIS — D631 Anemia in chronic kidney disease: Secondary | ICD-10-CM | POA: Diagnosis not present

## 2018-01-14 DIAGNOSIS — N2581 Secondary hyperparathyroidism of renal origin: Secondary | ICD-10-CM | POA: Diagnosis not present

## 2018-01-14 NOTE — Progress Notes (Signed)
Subjective: 84 year old female presents the office today with her nephew for follow-up evaluation of wound to the back of the left heel.Since I last saw her she has been admitted to the hospital for TIA.  Due to this she had to miss her vascular surgery appointment.  She is back at [facility] now.  She states they have been putting Betadine on the area daily.  She is been trying to keep it offloaded with offloading boot.  She has some occasional pain. Denies any systemic complaints such as fevers, chills, nausea, vomiting. No acute changes since last appointment, and no other complaints at this time.   Objective: AAO x3, NAD DP/PT pulses decreased bilaterally On the left posterior heel with a large non-stagable eschar measuring about 6 x 4.5 cm.  It is firmly adhered to the skin.  There is no surrounding erythema, ascending cellulitis.  There is no fluctuation or crepitation.  There is no drainage or pus identified today.  No other open lesions are identified.  No pain with calf compression, swelling, warmth, erythema  Assessment: Left heel eschar  Plan: -All treatment options discussed with the patient including all alternatives, risks, complications.  -The wound appears to be stable no signs of infection.  Continue Betadine wet-to-dry dressing changes for now.  Offloading at all times.  Limit activity on the heel.  We did get her in to see a vascular doctor next week and we did discuss this with her nephew via phone.  Vivi Barrack DPM

## 2018-01-17 ENCOUNTER — Encounter: Payer: Self-pay | Admitting: Internal Medicine

## 2018-01-17 ENCOUNTER — Non-Acute Institutional Stay (SKILLED_NURSING_FACILITY): Payer: Medicare Other | Admitting: Internal Medicine

## 2018-01-17 DIAGNOSIS — I482 Chronic atrial fibrillation: Secondary | ICD-10-CM

## 2018-01-17 DIAGNOSIS — I4891 Unspecified atrial fibrillation: Secondary | ICD-10-CM | POA: Insufficient documentation

## 2018-01-17 DIAGNOSIS — I4821 Permanent atrial fibrillation: Secondary | ICD-10-CM

## 2018-01-17 DIAGNOSIS — G459 Transient cerebral ischemic attack, unspecified: Secondary | ICD-10-CM

## 2018-01-17 DIAGNOSIS — N186 End stage renal disease: Secondary | ICD-10-CM | POA: Diagnosis not present

## 2018-01-17 DIAGNOSIS — E1129 Type 2 diabetes mellitus with other diabetic kidney complication: Secondary | ICD-10-CM | POA: Diagnosis not present

## 2018-01-17 DIAGNOSIS — D631 Anemia in chronic kidney disease: Secondary | ICD-10-CM | POA: Diagnosis not present

## 2018-01-17 DIAGNOSIS — N2581 Secondary hyperparathyroidism of renal origin: Secondary | ICD-10-CM | POA: Diagnosis not present

## 2018-01-17 NOTE — Progress Notes (Signed)
    NURSING HOME LOCATION:  [facility] ROOM NUMBER:  [id number]  CODE STATUS:  Full Code  PCP:  Mila Palmer, MD  [address] [place] [address]  This is a Nursing Facility readmission within 30 days.  Interim medical record and care since last [facility] visit was updated with review of diagnostic studies and change in clinical status since last visit were documented.  HPI: Patient was hospitalized 9/1-01/12/18,admitted with TIA symptoms with right gaze deviation and left-sided weakness.  No infarction was noted on her MRI or MRA of the head/neck.  The cause was felt to be cardioembolic TIA from atrial fibrillation. A fib was associated with RVR and complicated by hypotension.  Blood pressures would not tolerate a beta-blocker or calcium channel blocker for rate control.  Midodrine was initiated to allow the patient to tolerate dialysis.  Cardiology initiated amiodarone as well as digoxin. Sliding scale insulin was maintained for type 2 diabetes.  Review of systems: She is oriented x3.  She denies any active cardiopulmonary symptoms.  Specifically she denies any dysrhythmias or paroxysmal nocturnal dyspnea. Constitutional: No fever, significant weight change, fatigue  Eyes: No redness, discharge, pain, vision change ENT/mouth: No nasal congestion,  purulent discharge, earache, change in hearing, sore throat  Cardiovascular: No chest pain, palpitations, paroxysmal nocturnal dyspnea, claudication, edema  Respiratory: No cough, sputum production, hemoptysis, DOE, significant snoring, apnea   Gastrointestinal: No heartburn, dysphagia, abdominal pain, nausea /vomiting, rectal bleeding, melena, change in bowels Genitourinary: No dysuria, hematuria, pyuria, incontinence, nocturia Musculoskeletal: No joint stiffness, joint swelling, weakness, pain Dermatologic: No rash, pruritus, change in appearance of skin Neurologic: No dizziness, headache, syncope, seizures,  numbness, tingling Psychiatric: No significant anxiety, depression, insomnia, anorexia Endocrine: No change in hair/skin/nails, excessive thirst, excessive hunger, excessive urination  Hematologic/lymphatic: No significant bruising, lymphadenopathy, abnormal bleeding Allergy/immunology: No itchy/watery eyes, significant sneezing, urticaria, angioedema  Physical exam:  Pertinent or positive findings: Sclera are muddy.  She has complete dentures.  There is asymmetry of the clavicular heads with the left more prominent than the right.  She has low-grade diffuse rales.  Heart rhythm is slightly irregular.  Abdomen is protuberant.  BKA is present on the right.  The left foot is dressed.  Pedal pulses on the left are not palpable.  She has clubbing of the nailbeds.  General appearance: Adequately nourished; no acute distress, increased work of breathing is present.   Lymphatic: No lymphadenopathy about the head, neck, axilla. Eyes: No conjunctival inflammation or lid edema is present. There is no scleral icterus. Ears:  External ear exam shows no significant lesions or deformities.   Nose:  External nasal examination shows no deformity or inflammation. Nasal mucosa are pink and moist without lesions, exudates Oral exam:  Lips and gums are healthy appearing. There is no oropharyngeal erythema or exudate. Neck:  No thyromegaly, masses, tenderness noted.    Heart:  No gallop, murmur, click, rub .  Lungs:  without wheezes, rhonchi,  rubs. Abdomen: Bowel sounds are normal. Abdomen is soft and nontender with no organomegaly, hernias, masses. GU: Deferred  Extremities:  No cyanosis, edema  Neurologic exam : Balance, Rhomberg, finger to nose testing could not be completed due to clinical state Skin: Warm & dry w/o tenting. No significant  rash.  See summary under each active problem in the Problem List with associated updated therapeutic plan

## 2018-01-17 NOTE — Assessment & Plan Note (Addendum)
Rate control is primary goal Cardiology to reassess long term need for Amiodarone &  Digoxin

## 2018-01-18 ENCOUNTER — Encounter: Payer: Self-pay | Admitting: Internal Medicine

## 2018-01-18 NOTE — Assessment & Plan Note (Addendum)
Anticoagulation permanently

## 2018-01-18 NOTE — Patient Instructions (Signed)
See assessment and plan under each diagnosis in the problem list and acutely for this visit 

## 2018-01-19 DIAGNOSIS — E1129 Type 2 diabetes mellitus with other diabetic kidney complication: Secondary | ICD-10-CM | POA: Diagnosis not present

## 2018-01-19 DIAGNOSIS — D631 Anemia in chronic kidney disease: Secondary | ICD-10-CM | POA: Diagnosis not present

## 2018-01-19 DIAGNOSIS — N2581 Secondary hyperparathyroidism of renal origin: Secondary | ICD-10-CM | POA: Diagnosis not present

## 2018-01-19 DIAGNOSIS — N186 End stage renal disease: Secondary | ICD-10-CM | POA: Diagnosis not present

## 2018-01-20 ENCOUNTER — Encounter: Payer: Self-pay | Admitting: *Deleted

## 2018-01-20 ENCOUNTER — Encounter: Payer: Self-pay | Admitting: Vascular Surgery

## 2018-01-20 ENCOUNTER — Encounter: Payer: Self-pay | Admitting: Podiatry

## 2018-01-20 ENCOUNTER — Other Ambulatory Visit: Payer: Self-pay | Admitting: *Deleted

## 2018-01-20 ENCOUNTER — Ambulatory Visit (INDEPENDENT_AMBULATORY_CARE_PROVIDER_SITE_OTHER): Payer: Medicare Other | Admitting: Podiatry

## 2018-01-20 ENCOUNTER — Other Ambulatory Visit: Payer: Self-pay

## 2018-01-20 ENCOUNTER — Ambulatory Visit (INDEPENDENT_AMBULATORY_CARE_PROVIDER_SITE_OTHER): Payer: Medicare Other | Admitting: Vascular Surgery

## 2018-01-20 VITALS — BP 126/69 | HR 71 | Temp 96.8°F | Resp 16 | Ht 63.0 in | Wt 177.0 lb

## 2018-01-20 DIAGNOSIS — I7025 Atherosclerosis of native arteries of other extremities with ulceration: Secondary | ICD-10-CM

## 2018-01-20 DIAGNOSIS — L98499 Non-pressure chronic ulcer of skin of other sites with unspecified severity: Secondary | ICD-10-CM | POA: Diagnosis not present

## 2018-01-20 DIAGNOSIS — I70201 Unspecified atherosclerosis of native arteries of extremities, right leg: Secondary | ICD-10-CM | POA: Diagnosis not present

## 2018-01-20 DIAGNOSIS — L97409 Non-pressure chronic ulcer of unspecified heel and midfoot with unspecified severity: Secondary | ICD-10-CM

## 2018-01-20 NOTE — Progress Notes (Signed)
Patient ID: [name], female   DOB: 1933-01-09, 84 y.o.   MRN: [id number]  Reason for Consult: PAD (wound to left heel)   Referred by Mila Palmer, MD  Subjective:     HPI:  [name] is a 84 y.o. female with history of end-stage renal disease previous right SFA stenting subsequently underwent right below-knee amputation which is healing well.  She now has a heel ulcer on the left that is been present for 2 to 3 months.  She previously had a toe ulceration that healed.  She is undergone angiogram of the left leg in the past which demonstrated 50% stenosis in the SFA.  She does have rest pain relieved with hanging her leg over side of the bed.  She has not had any erythema, fevers or chills.  She is getting Betadine treatment at [facility].  She was evaluated by Dr. Ardelle Anton with podiatry earlier today.  She is currently nonambulatory but does use her left leg for pivoting and transfers.  Past Medical History:  Diagnosis Date  . Anemia   . C. difficile diarrhea   . Chronic kidney disease    on Dialysis  . Depression   . Diabetes mellitus   . Diabetic neuropathy (HCC)   . Gangrene (HCC)    right foot  . GERD (gastroesophageal reflux disease)   . Gout   . Hyperparathyroidism   . MRSA (methicillin resistant staph aureus) culture positive   . Obesity   . Osteoarthritis   . Peripheral arterial disease (HCC)    Gangrene-Left Great Toe  . Shortness of breath dyspnea    with exertion  . Stroke [facility])    Family History  Problem Relation Age of Onset  . Cancer Father        type unknown  . Stroke Mother   . Diabetes Sister   . Cancer Brother        type unknown   Past Surgical History:  Procedure Laterality Date  . ABDOMINAL AORTOGRAM N/A 07/05/2016   Procedure: Abdominal Aortogram;  Surgeon: Maeola Harman, MD;  Location: [facility] INVASIVE CV LAB;  Service: Cardiovascular;  Laterality: N/A;  . ABDOMINAL AORTOGRAM N/A 06/24/2017   Procedure: ABDOMINAL AORTOGRAM;   Surgeon: Sherren Kerns, MD;  Location: [facility] INVASIVE CV LAB;  Service: Cardiovascular;  Laterality: N/A;  . ABDOMINAL HYSTERECTOMY    . AMPUTATION Right 11/11/2017   Procedure: RIGHT BELOW KNEE AMPUTATION;  Surgeon: Nadara Mustard, MD;  Location: [facility] OR;  Service: Orthopedics;  Laterality: Right;  . AV FISTULA PLACEMENT Left 07/07/2015   Procedure: ATTEMPTED INSERTION OFLEFT  ARTERIOVENOUS (AV) GORE-TEX GRAFT THIGH;  Surgeon: Fransisco Hertz, MD;  Location: [facility] OR;  Service: Vascular;  Laterality: Left;  . BELOW KNEE LEG AMPUTATION Right 11/11/2017  . BREAST EXCISIONAL BIOPSY Right 05/25/2006  . COLONOSCOPY    . LOWER EXTREMITY ANGIOGRAPHY Bilateral 07/05/2016   Procedure: Lower Extremity Angiography;  Surgeon: Maeola Harman, MD;  Location: [facility] INVASIVE CV LAB;  Service: Cardiovascular;  Laterality: Bilateral;  . LOWER EXTREMITY ANGIOGRAPHY Bilateral 06/24/2017   Procedure: Lower Extremity Angiography;  Surgeon: Sherren Kerns, MD;  Location: [facility] INVASIVE CV LAB;  Service: Cardiovascular;  Laterality: Bilateral;  . MULTIPLE TOOTH EXTRACTIONS    . PERIPHERAL VASCULAR INTERVENTION Right 06/24/2017   Procedure: PERIPHERAL VASCULAR INTERVENTION;  Surgeon: Sherren Kerns, MD;  Location: [facility] INVASIVE CV LAB;  Service: Cardiovascular;  Laterality: Right;  POPLITEALPTA SFA STENT  . SHUNTOGRAM Right 09/06/12  Thigh graft  . SP DECLOT AVGG Right Sep 14, 2012   Right thigh graft    Short Social History:  Social History   Tobacco Use  . Smoking status: Former Smoker    Types: Cigarettes  . Smokeless tobacco: Never Used  Substance Use Topics  . Alcohol use: No    Allergies  Allergen Reactions  . Codeine Hives  . Vancomycin Rash    Current Outpatient Medications  Medication Sig Dispense Refill  . Amino Acids-Protein Hydrolys (FEEDING SUPPLEMENT, PRO-STAT SUGAR FREE 64,) LIQD Take 30 mLs by mouth daily.     Marland Kitchen. amiodarone (PACERONE) 200 MG tablet Take 1 tablet (200 mg total) by mouth 2  (two) times daily. 60 tablet 0  . apixaban (ELIQUIS) 2.5 MG TABS tablet Take 1 tablet (2.5 mg total) by mouth 2 (two) times daily. 60 tablet 0  . clopidogrel (PLAVIX) 75 MG tablet Take 1 tablet (75 mg total) by mouth daily. 30 tablet 11  . digoxin 62.5 MCG TABS Take 0.0625 mg by mouth daily. 30 tablet 0  . gabapentin (NEURONTIN) 100 MG capsule Take 100 mg by mouth at bedtime.     . metoprolol tartrate (LOPRESSOR) 25 MG tablet Take 0.5 tablets (12.5 mg total) by mouth 2 (two) times daily. 60 tablet 0  . midodrine (PROAMATINE) 10 MG tablet Take 1 tablet (10 mg total) by mouth 2 (two) times daily with a meal. 60 tablet 0  . Nutritional Supplements (FEEDING SUPPLEMENT, NEPRO CARB STEADY,) LIQD Take 237 mLs by mouth daily.    Marland Kitchen. oxyCODONE-acetaminophen (PERCOCET/ROXICET) 5-325 MG tablet Take 1 tablet by mouth every 4 (four) hours as needed. 30 tablet 0  . RENVELA 800 MG tablet Take 1,600-2,400 mg by mouth See admin instructions. Take 2400 mg by mouth 3 times daily with meals and take 1600 mg by mouth with snacks    . SENSIPAR 30 MG tablet Take 30 mg by mouth every evening.      Current Facility-Administered Medications  Medication Dose Route Frequency Provider Last Rate Last Dose  . protamine injection 25 mg  25 mg Intravenous Once Sherren KernsFields, Charles E, MD        Review of Systems  Constitutional:  Constitutional negative. Respiratory: Respiratory negative.  Cardiovascular: Cardiovascular negative.  GI: Gastrointestinal negative.  Musculoskeletal: Positive for leg pain.  Skin: Positive for wound.  Neurological: Neurological negative. Psychiatric: Psychiatric negative.        Objective:  Objective   Vitals:   01/20/18 1344  BP: 126/69  Pulse: 71  Resp: 16  Temp: (!) 96.8 F (36 C)  TempSrc: Oral  SpO2: 100%  Weight: 177 lb (80.3 kg)  Height: 5\' 3"  (1.6 m)   Body mass index is 31.35 kg/m.  Physical Exam  Constitutional: She is oriented to person, place, and time. She appears  well-developed.  Eyes: Pupils are equal, round, and reactive to light.  Neck: Normal range of motion.  Cardiovascular: Normal rate.  Pulses:      Femoral pulses are 2+ on the right side, and 2+ on the left side.      Popliteal pulses are 0 on the left side.  Monophasic peroneal signal is the dominant seen on the left  Pulmonary/Chest: Effort normal.  Neurological: She is alert and oriented to person, place, and time.  Skin:  There is a left heel ulcer that is dry approximately 4 cm  Psychiatric: She has a normal mood and affect. Her behavior is normal. Thought content normal.  Data: I reviewed her previous angiograms demonstrated left SFA stenosis dominant runoff via the peroneal artery     Assessment/Plan:     84 year old female with end-stage renal disease.  She does take Eliquis as well as Plavix.  At this time she does not want to consider amputation of her left lower extremity and is in such we will try to save this with angiography to improve her blood flow.  Unfortunately I am not sure anything will lead to salvage of the extremity but she does need it for transfers and so I would not recommend amputation as she currently has no active infection.  Potentially increase in the blood flow could slow the progression of the ulceration give her longer time to be mobile.  We will get her set up for a nondialysis day in the near future and she will need to hold Eliquis for 48 hours prior.     Maeola Harman MD [facility]

## 2018-01-20 NOTE — Progress Notes (Signed)
Subjective: 84 year old female presents the office today with her nephew for follow-up evaluation of wound to the back of the left heel. [facility] They have been putting betadine on the wound daily. She is scheduled for vascular surgery appointment later this afternoon. Occasional pain, but somewhat improved. Denies any systemic complaints such as fevers, chills, nausea, vomiting. No acute changes since last appointment, and no other complaints at this time.   Objective: AAO x3, NAD Neurovascular status unchanged. Right BKA On the left posterior heel with a large non-stagable eschar measuring about 6 x 4.5 cm. Appears to be about the same in size. There is no surrounding erythema, ascending cellulitis.  There is no fluctuation or crepitation.  There is no drainage or pus identified today.  No other open lesions are identified. Hyperpigmented changes to the forefoot.  No pain with calf compression, swelling, warmth, erythema      Assessment: Left heel eschar  Plan: -All treatment options discussed with the patient including all alternatives, risks, complications.  -The wound appears to be stable no signs of infection.  Continue Betadine wet-to-dry dressing changes for now.  Offloading at all times.  Limit activity on the heel. She is scheduled for vascular surgery today.   Vivi BarrackMatthew R Wagoner DPM

## 2018-01-21 DIAGNOSIS — N2581 Secondary hyperparathyroidism of renal origin: Secondary | ICD-10-CM | POA: Diagnosis not present

## 2018-01-21 DIAGNOSIS — D631 Anemia in chronic kidney disease: Secondary | ICD-10-CM | POA: Diagnosis not present

## 2018-01-21 DIAGNOSIS — E1129 Type 2 diabetes mellitus with other diabetic kidney complication: Secondary | ICD-10-CM | POA: Diagnosis not present

## 2018-01-21 DIAGNOSIS — N186 End stage renal disease: Secondary | ICD-10-CM | POA: Diagnosis not present

## 2018-01-23 ENCOUNTER — Encounter (HOSPITAL_COMMUNITY): Payer: Self-pay | Admitting: Vascular Surgery

## 2018-01-23 ENCOUNTER — Encounter (HOSPITAL_COMMUNITY)
Admission: RE | Disposition: A | Discharge status: Home / Self Care | Payer: Self-pay | Source: Ambulatory Visit | Attending: Vascular Surgery

## 2018-01-23 ENCOUNTER — Ambulatory Visit (HOSPITAL_COMMUNITY)
Admission: RE | Admit: 2018-01-23 | Discharge: 2018-01-23 | Disposition: A | Discharge status: Home / Self Care | Payer: Medicare Other | Source: Ambulatory Visit | Attending: Vascular Surgery | Admitting: Vascular Surgery

## 2018-01-23 ENCOUNTER — Telehealth: Payer: Self-pay | Admitting: Vascular Surgery

## 2018-01-23 ENCOUNTER — Other Ambulatory Visit: Payer: Self-pay

## 2018-01-23 ENCOUNTER — Encounter: Payer: Medicare Other | Admitting: Podiatry

## 2018-01-23 DIAGNOSIS — L97429 Non-pressure chronic ulcer of left heel and midfoot with unspecified severity: Secondary | ICD-10-CM | POA: Diagnosis not present

## 2018-01-23 DIAGNOSIS — E1140 Type 2 diabetes mellitus with diabetic neuropathy, unspecified: Secondary | ICD-10-CM | POA: Insufficient documentation

## 2018-01-23 DIAGNOSIS — E669 Obesity, unspecified: Secondary | ICD-10-CM | POA: Insufficient documentation

## 2018-01-23 DIAGNOSIS — Z89511 Acquired absence of right leg below knee: Secondary | ICD-10-CM | POA: Insufficient documentation

## 2018-01-23 DIAGNOSIS — Z8673 Personal history of transient ischemic attack (TIA), and cerebral infarction without residual deficits: Secondary | ICD-10-CM | POA: Insufficient documentation

## 2018-01-23 DIAGNOSIS — Z7902 Long term (current) use of antithrombotics/antiplatelets: Secondary | ICD-10-CM | POA: Insufficient documentation

## 2018-01-23 DIAGNOSIS — M109 Gout, unspecified: Secondary | ICD-10-CM | POA: Insufficient documentation

## 2018-01-23 DIAGNOSIS — Z87891 Personal history of nicotine dependence: Secondary | ICD-10-CM | POA: Insufficient documentation

## 2018-01-23 DIAGNOSIS — Z6831 Body mass index (BMI) 31.0-31.9, adult: Secondary | ICD-10-CM | POA: Insufficient documentation

## 2018-01-23 DIAGNOSIS — E1151 Type 2 diabetes mellitus with diabetic peripheral angiopathy without gangrene: Secondary | ICD-10-CM | POA: Insufficient documentation

## 2018-01-23 DIAGNOSIS — E1122 Type 2 diabetes mellitus with diabetic chronic kidney disease: Secondary | ICD-10-CM | POA: Diagnosis not present

## 2018-01-23 DIAGNOSIS — Z7901 Long term (current) use of anticoagulants: Secondary | ICD-10-CM | POA: Insufficient documentation

## 2018-01-23 DIAGNOSIS — E213 Hyperparathyroidism, unspecified: Secondary | ICD-10-CM | POA: Insufficient documentation

## 2018-01-23 DIAGNOSIS — M1990 Unspecified osteoarthritis, unspecified site: Secondary | ICD-10-CM | POA: Insufficient documentation

## 2018-01-23 DIAGNOSIS — K219 Gastro-esophageal reflux disease without esophagitis: Secondary | ICD-10-CM | POA: Diagnosis not present

## 2018-01-23 DIAGNOSIS — I70244 Atherosclerosis of native arteries of left leg with ulceration of heel and midfoot: Secondary | ICD-10-CM | POA: Insufficient documentation

## 2018-01-23 DIAGNOSIS — N186 End stage renal disease: Secondary | ICD-10-CM | POA: Insufficient documentation

## 2018-01-23 HISTORY — PX: ABDOMINAL AORTOGRAM W/LOWER EXTREMITY: CATH118223

## 2018-01-23 HISTORY — PX: PERIPHERAL VASCULAR BALLOON ANGIOPLASTY: CATH118281

## 2018-01-23 LAB — POCT I-STAT, CHEM 8
BUN: 66 mg/dL — AB (ref 8–23)
CHLORIDE: 98 mmol/L (ref 98–111)
CREATININE: 6.5 mg/dL — AB (ref 0.44–1.00)
Calcium, Ion: 1.18 mmol/L (ref 1.15–1.40)
Glucose, Bld: 117 mg/dL — ABNORMAL HIGH (ref 70–99)
HEMATOCRIT: 44 % (ref 36.0–46.0)
Hemoglobin: 15 g/dL (ref 12.0–15.0)
Potassium: 5.3 mmol/L — ABNORMAL HIGH (ref 3.5–5.1)
Sodium: 138 mmol/L (ref 135–145)
TCO2: 33 mmol/L — AB (ref 22–32)

## 2018-01-23 LAB — GLUCOSE, CAPILLARY
Glucose-Capillary: 103 mg/dL — ABNORMAL HIGH (ref 70–99)
Glucose-Capillary: 88 mg/dL (ref 70–99)

## 2018-01-23 SURGERY — ABDOMINAL AORTOGRAM W/LOWER EXTREMITY
Anesthesia: LOCAL

## 2018-01-23 MED ORDER — IODIXANOL 320 MG/ML IV SOLN
INTRAVENOUS | Status: DC | PRN
  Administered 2018-01-23: 90 mL via INTRAVENOUS

## 2018-01-23 MED ORDER — ONDANSETRON HCL 4 MG/2ML IJ SOLN: 4.0000 mg | Freq: Four times a day (QID) | INTRAMUSCULAR | Status: DC | PRN

## 2018-01-23 MED ORDER — OXYCODONE HCL 5 MG PO TABS: 5.0000 mg | ORAL_TABLET | ORAL | Status: DC | PRN

## 2018-01-23 MED ORDER — LABETALOL HCL 5 MG/ML IV SOLN: 10.0000 mg | INTRAVENOUS | Status: DC | PRN

## 2018-01-23 MED ORDER — SODIUM CHLORIDE 0.9% FLUSH: 3.0000 mL | INTRAVENOUS | Status: DC | PRN

## 2018-01-23 MED ORDER — SODIUM CHLORIDE 0.9% FLUSH: 3.0000 mL | Freq: Two times a day (BID) | INTRAVENOUS | Status: DC

## 2018-01-23 MED ORDER — LIDOCAINE HCL (PF) 1 % IJ SOLN
INTRAMUSCULAR | Status: AC
  Filled 2018-01-23: qty 30

## 2018-01-23 MED ORDER — SODIUM CHLORIDE 0.9 % IV SOLN: 250.0000 mL | INTRAVENOUS | Status: DC | PRN

## 2018-01-23 MED ORDER — HEPARIN SODIUM (PORCINE) 1000 UNIT/ML IJ SOLN
INTRAMUSCULAR | Status: AC
  Filled 2018-01-23: qty 1

## 2018-01-23 MED ORDER — LIDOCAINE HCL (PF) 1 % IJ SOLN
INTRAMUSCULAR | Status: DC | PRN
  Administered 2018-01-23: 15 mL via INTRADERMAL

## 2018-01-23 MED ORDER — HYDRALAZINE HCL 20 MG/ML IJ SOLN: 5.0000 mg | INTRAMUSCULAR | Status: DC | PRN

## 2018-01-23 MED ORDER — HEPARIN (PORCINE) IN NACL 1000-0.9 UT/500ML-% IV SOLN
INTRAVENOUS | Status: AC
  Filled 2018-01-23: qty 1000

## 2018-01-23 MED ORDER — CLOPIDOGREL BISULFATE 75 MG PO TABS
ORAL_TABLET | ORAL | Status: DC | PRN
  Administered 2018-01-23: 75 mg via ORAL

## 2018-01-23 MED ORDER — HEPARIN SODIUM (PORCINE) 1000 UNIT/ML IJ SOLN
INTRAMUSCULAR | Status: DC | PRN
  Administered 2018-01-23: 8000 [IU] via INTRAVENOUS

## 2018-01-23 MED ORDER — ACETAMINOPHEN 325 MG PO TABS: 650.0000 mg | ORAL_TABLET | ORAL | Status: DC | PRN

## 2018-01-23 MED ORDER — HEPARIN (PORCINE) IN NACL 1000-0.9 UT/500ML-% IV SOLN
INTRAVENOUS | Status: DC | PRN
  Administered 2018-01-23 (×2): 500 mL

## 2018-01-23 MED ORDER — CLOPIDOGREL BISULFATE 75 MG PO TABS
ORAL_TABLET | ORAL | Status: AC
  Filled 2018-01-23: qty 1

## 2018-01-23 SURGICAL SUPPLY — 19 items
BALLN COYOTE OTW 2.5X150X150 (BALLOONS) ×3
BALLOON COYOTE OTW 2.5X150X150 (BALLOONS) IMPLANT
CATH CXI SUPP ST 2.6FR 150CM (CATHETERS) ×1 IMPLANT
CATH OMNI FLUSH 5F 65CM (CATHETERS) ×1 IMPLANT
DEVICE CLOSURE MYNXGRIP 6/7F (Vascular Products) ×1 IMPLANT
GLIDEWIRE ANGLED NITR .018X260 (WIRE) ×1 IMPLANT
KIT ENCORE 26 ADVANTAGE (KITS) ×1 IMPLANT
KIT MICROPUNCTURE NIT STIFF (SHEATH) ×1 IMPLANT
KIT PV (KITS) ×3 IMPLANT
SHEATH HIGHFLEX ANSEL 6FRX55 (SHEATH) ×1 IMPLANT
SHEATH PINNACLE 5F 10CM (SHEATH) ×1 IMPLANT
SHEATH PINNACLE 6F 10CM (SHEATH) ×1 IMPLANT
SHEATH PROBE COVER 6X72 (BAG) ×1 IMPLANT
SYR MEDRAD MARK V 150ML (SYRINGE) ×3 IMPLANT
TRANSDUCER W/STOPCOCK (MISCELLANEOUS) ×3 IMPLANT
TRAY PV CATH (CUSTOM PROCEDURE TRAY) ×3 IMPLANT
WIRE BENTSON .035X145CM (WIRE) ×1 IMPLANT
WIRE G V18X300CM (WIRE) ×1 IMPLANT
WIRE SPARTACORE .014X300CM (WIRE) ×1 IMPLANT

## 2018-01-23 NOTE — Op Note (Signed)
    Patient name: Jamie LippsBetty G Tesler MRN: [id number] DOB: 09/14/1932 Sex: female  01/23/2018 Pre-operative Diagnosis: Critical left lower extremity ischemia with left heel ulceration, end-stage renal disease Post-operative diagnosis:  Same Surgeon:  Luanna SalkBrandon C. Randie Heinzain, MD Procedure Performed: 1.  Ultrasound-guided cannulation right common femoral artery 2.  Aortogram with left lower extremity angiogram 3.  Balloon angioplasty of left peroneal artery with 2.5 mm balloon 4.  Minx device closure of right common femoral artery   Indications: 84 year old female who is undergone right lower extremity amputation now has heel ulceration on the left that is been stable for 2 to 3 months.  She has very minimal signals at her ankle is indicated for angina and possible intervention on the left.  Findings: Aorta and iliac segments are free of flow-limiting stenosis however are heavily calcified.  There are multiple approximately 30% stenosis throughout her SFA popliteal segment her only runoff on the left leg is the peroneal artery and this is occluded first segment of approximately 6 cm.  After balloon angioplasty there is no further residual stenosis and no flow-limiting dissection.  The radial artery does give out at the ankle.  I could not cross a posterior tibial that is occluded there is only a takeoff of an anterior tibial.   Procedure:  The patient was identified in the holding area and taken to room 8.  The patient was then placed supine on the table and prepped and draped in the usual sterile fashion.  A time out was called.  Ultrasound was used to evaluate the right common femoral artery there was noted to be a bypass there and was noted to be patent.  The area was anesthetized 1% lidocaine.  Using direct ultrasound guidance I cannulated the common femoral artery with micropuncture needle followed by wire sheath.  Bentson wire was placed aorta short 5 French sheath was placed.  Omni Flush catheter was used to  perform aortogram.  We then crossed the bifurcation perform left lower extremity angiogram.  With the above findings I exchanged for a long 6 French sheath in the left SFA patient was given 8000 units of heparin.  Identities V 18 and then used in a 1 a Glidewire to cross the occluded peroneal artery and confirmed intraluminal access with a CXI catheter.  Primary balloon angioplasty with 2.5 mm balloon was performed.  Completion demonstrated no residual stenosis or dissection in the peroneal.  Satisfied with this I did attempt to cross the posterior tibial artery but I could not go anywhere as this did not have any distal target.  I subsequently removed catheter and wire.  I exchanged for a short 6 French sheath and deployed a minx device which deployed well.  She tolerated this procedure well without immediate complication.   Contrast: 90cc  Jairen Goldfarb C. Randie Heinzain, MD [facility] [place] Office: [phone] Pager: [phone]

## 2018-01-23 NOTE — Telephone Encounter (Signed)
sch appt mld ltr 11/1/a9 230pm ABI 315pm wound check

## 2018-01-23 NOTE — H&P (Signed)
   History and Physical Update  The patient was interviewed and re-examined.  The patient's previous History and Physical has been reviewed and is unchanged from recent office visit. Plan for aortogram and possible intervention on the left.   Coriann Brouhard C. Randie Heinzain, MD [facility] [place] Office: [phone] Pager: [phone]   01/23/2018, 7:28 AM

## 2018-01-23 NOTE — Discharge Instructions (Signed)

## 2018-01-24 DIAGNOSIS — E1129 Type 2 diabetes mellitus with other diabetic kidney complication: Secondary | ICD-10-CM | POA: Diagnosis not present

## 2018-01-24 DIAGNOSIS — N186 End stage renal disease: Secondary | ICD-10-CM | POA: Diagnosis not present

## 2018-01-24 DIAGNOSIS — N2581 Secondary hyperparathyroidism of renal origin: Secondary | ICD-10-CM | POA: Diagnosis not present

## 2018-01-24 DIAGNOSIS — D631 Anemia in chronic kidney disease: Secondary | ICD-10-CM | POA: Diagnosis not present

## 2018-01-26 ENCOUNTER — Other Ambulatory Visit: Payer: Self-pay

## 2018-01-26 DIAGNOSIS — L98499 Non-pressure chronic ulcer of skin of other sites with unspecified severity: Secondary | ICD-10-CM

## 2018-01-26 DIAGNOSIS — N2581 Secondary hyperparathyroidism of renal origin: Secondary | ICD-10-CM | POA: Diagnosis not present

## 2018-01-26 DIAGNOSIS — D631 Anemia in chronic kidney disease: Secondary | ICD-10-CM | POA: Diagnosis not present

## 2018-01-26 DIAGNOSIS — I7025 Atherosclerosis of native arteries of other extremities with ulceration: Secondary | ICD-10-CM

## 2018-01-26 DIAGNOSIS — N186 End stage renal disease: Secondary | ICD-10-CM | POA: Diagnosis not present

## 2018-01-26 DIAGNOSIS — I739 Peripheral vascular disease, unspecified: Secondary | ICD-10-CM

## 2018-01-26 DIAGNOSIS — E1129 Type 2 diabetes mellitus with other diabetic kidney complication: Secondary | ICD-10-CM | POA: Diagnosis not present

## 2018-01-28 DIAGNOSIS — D631 Anemia in chronic kidney disease: Secondary | ICD-10-CM | POA: Diagnosis not present

## 2018-01-28 DIAGNOSIS — E1129 Type 2 diabetes mellitus with other diabetic kidney complication: Secondary | ICD-10-CM | POA: Diagnosis not present

## 2018-01-28 DIAGNOSIS — N2581 Secondary hyperparathyroidism of renal origin: Secondary | ICD-10-CM | POA: Diagnosis not present

## 2018-01-28 DIAGNOSIS — N186 End stage renal disease: Secondary | ICD-10-CM | POA: Diagnosis not present

## 2018-01-30 ENCOUNTER — Encounter (INDEPENDENT_AMBULATORY_CARE_PROVIDER_SITE_OTHER): Payer: Self-pay | Admitting: Orthopedic Surgery

## 2018-01-30 ENCOUNTER — Ambulatory Visit (INDEPENDENT_AMBULATORY_CARE_PROVIDER_SITE_OTHER): Payer: Medicare Other | Admitting: Physician Assistant

## 2018-01-30 VITALS — Ht 63.0 in | Wt 177.0 lb

## 2018-01-30 DIAGNOSIS — IMO0002 Reserved for concepts with insufficient information to code with codable children: Secondary | ICD-10-CM

## 2018-01-30 DIAGNOSIS — Z89511 Acquired absence of right leg below knee: Secondary | ICD-10-CM

## 2018-01-30 NOTE — Progress Notes (Signed)
Office Visit Note   Patient: [name]           Date of Birth: 04/22/1933           MRN: [id number] Visit Date: 01/30/2018              Requested by: Mila PalmerWolters, Sharon, MD [address] [place], [place] [address] PCP: Mila PalmerWolters, Sharon, MD  Chief Complaint  Patient presents with  . Right Leg - Follow-up, Wound Check    11/11/17  Right BKA      HPI: Patient is a 84 year old female who is status post a right transtibial amputation on 11/21/2017.  She is now approximately 2.5 months postop.  She reports that she has been doing well and is still at [facility].  She is been wearing the stump shrinker stocking.  She had some initial wound dehiscence but this is continuing to improve with local care.  Assessment & Plan: Visit Diagnoses:  1. Below knee amputation status, right [facility](HCC)     Plan: Patient can proceed to fitting for her prosthesis with [facility].  We have recommended a smaller stump shrinker.  She will follow-up here in 4 weeks.  Follow-Up Instructions: Return in about 4 weeks (around 02/27/2018).   Ortho Exam  Patient is alert, oriented, no adenopathy, well-dressed, normal affect, normal respiratory effort. The right transtibial amputation site incision is healed except for a very small central open area which is 1 x 1.8 x 0.2 cm.  There are no signs of cellulitis.  There is minimal edema.  Imaging: No results found. No images are attached to the encounter.  Labs: Lab Results  Component Value Date   HGBA1C 5.5 01/09/2018   HGBA1C 5.7 (H) 11/11/2017   HGBA1C 5.8 (H) 12/15/2013   ESRSEDRATE 17 12/17/2006   ESRSEDRATE 108 (H) 10/17/2006   ESRSEDRATE 110 (H) 10/12/2006   LABURIC 10.7 (H) 10/12/2006   REPTSTATUS 10/14/2017 FINAL 10/11/2017   GRAMSTAIN  10/11/2017    FEW WBC PRESENT, PREDOMINANTLY PMN ABUNDANT GRAM POSITIVE COCCI IN PAIRS ABUNDANT GRAM NEGATIVE RODS MODERATE GRAM POSITIVE RODS Performed at [facility], [address] [place], [place] [address]    CULT  10/11/2017    MODERATE ESCHERICHIA COLI FEW METHICILLIN RESISTANT STAPHYLOCOCCUS AUREUS    LABORGA ESCHERICHIA COLI 10/11/2017   LABORGA METHICILLIN RESISTANT STAPHYLOCOCCUS AUREUS 10/11/2017     Lab Results  Component Value Date   ALBUMIN 2.6 (L) 01/12/2018   ALBUMIN 2.6 (L) 01/10/2018   ALBUMIN 2.8 (L) 01/09/2018   LABURIC 10.7 (H) 10/12/2006    Body mass index is 31.35 kg/m.  Orders:  No orders of the defined types were placed in this encounter.  No orders of the defined types were placed in this encounter.    Procedures: No procedures performed  Clinical Data: No additional findings.  ROS:  All other systems negative, except as noted in the HPI. Review of Systems  Objective: Vital Signs: Ht 5\' 3"  (1.6 m)   Wt 177 lb (80.3 kg)   BMI 31.35 kg/m   Specialty Comments:  No specialty comments available.  PMFS History: Patient Active Problem List   Diagnosis Date Noted  . Atrial fibrillation (HCC) 01/17/2018  . Gangrene (HCC)   . Pressure injury of skin 01/09/2018  . TIA (transient ischemic attack) 01/08/2018  . Unilateral complete BKA, right, initial encounter (HCC)   . Diabetes mellitus type 2 in obese (HCC)   . History of  CVA (cerebrovascular accident)   . Post-operative pain   . Acute blood loss anemia   . Anemia of chronic disease   . Benign essential HTN   . Morbid obesity (HCC)   . Below knee amputation status, right (HCC) 11/11/2017  . Gangrene of right foot (HCC)   . PAD (peripheral artery disease) (HCC) 04/29/2015  . Atherosclerosis of native arteries of the extremities with ulceration (HCC) 01/10/2014  . CVA (cerebral infarction) 12/14/2013  . Dysphagia, unspecified(787.20) 11/28/2013  . Diabetes mellitus with peripheral vascular disease (HCC) 10/31/2012  . Depression 10/31/2012  . Anemia 10/31/2012  . History of Clostridium difficile colitis 10/31/2012  . Chronic diarrhea  10/31/2012  . ESRD on dialysis [facility]) 10/06/2012   Past Medical History:  Diagnosis Date  . Anemia   . C. difficile diarrhea   . Chronic kidney disease    on Dialysis  . Depression   . Diabetes mellitus   . Diabetic neuropathy (HCC)   . Gangrene (HCC)    right foot  . GERD (gastroesophageal reflux disease)   . Gout   . Hyperparathyroidism   . MRSA (methicillin resistant staph aureus) culture positive   . Obesity   . Osteoarthritis   . Peripheral arterial disease (HCC)    Gangrene-Left Great Toe  . Shortness of breath dyspnea    with exertion  . Stroke [facility])     Family History  Problem Relation Age of Onset  . Cancer Father        type unknown  . Stroke Mother   . Diabetes Sister   . Cancer Brother        type unknown    Past Surgical History:  Procedure Laterality Date  . ABDOMINAL AORTOGRAM N/A 07/05/2016   Procedure: Abdominal Aortogram;  Surgeon: Maeola Harman, MD;  Location: [facility] INVASIVE CV LAB;  Service: Cardiovascular;  Laterality: N/A;  . ABDOMINAL AORTOGRAM N/A 06/24/2017   Procedure: ABDOMINAL AORTOGRAM;  Surgeon: Sherren Kerns, MD;  Location: [facility] INVASIVE CV LAB;  Service: Cardiovascular;  Laterality: N/A;  . ABDOMINAL AORTOGRAM W/LOWER EXTREMITY N/A 01/23/2018   Procedure: ABDOMINAL AORTOGRAM W/LOWER EXTREMITY - Left Side;  Surgeon: Maeola Harman, MD;  Location: [facility] INVASIVE CV LAB;  Service: Cardiovascular;  Laterality: N/A;  . ABDOMINAL HYSTERECTOMY    . AMPUTATION Right 11/11/2017   Procedure: RIGHT BELOW KNEE AMPUTATION;  Surgeon: Nadara Mustard, MD;  Location[facility] OR;  Service: Orthopedics;  Laterality: Right;  . AV FISTULA PLACEMENT Left 07/07/2015   Procedure: ATTEMPTED INSERTION OFLEFT  ARTERIOVENOUS (AV) GORE-TEX GRAFT THIGH;  Surgeon: Fransisco Hertz, MD;  Location: [facility] OR;  Service: Vascular;  Laterality: Left;  . BELOW KNEE LEG AMPUTATION Right 11/11/2017  . BREAST EXCISIONAL BIOPSY Right 05/25/2006  . COLONOSCOPY    . LOWER  EXTREMITY ANGIOGRAPHY Bilateral 07/05/2016   Procedure: Lower Extremity Angiography;  Surgeon: Maeola Harman, MD;  Location: [facility] INVASIVE CV LAB;  Service: Cardiovascular;  Laterality: Bilateral;  . LOWER EXTREMITY ANGIOGRAPHY Bilateral 06/24/2017   Procedure: Lower Extremity Angiography;  Surgeon: Sherren Kerns, MD;  Location: [facility] INVASIVE CV LAB;  Service: Cardiovascular;  Laterality: Bilateral;  . MULTIPLE TOOTH EXTRACTIONS    . PERIPHERAL VASCULAR BALLOON ANGIOPLASTY Left 01/23/2018   Procedure: PERIPHERAL VASCULAR BALLOON ANGIOPLASTY;  Surgeon: Maeola Harman, MD;  Location: [facility] INVASIVE CV LAB;  Service: Cardiovascular;  Laterality: Left;  peroneal  . PERIPHERAL VASCULAR INTERVENTION Right 06/24/2017   Procedure: PERIPHERAL VASCULAR INTERVENTION;  Surgeon: Sherren Kerns, MD;  Location: [facility] INVASIVE CV LAB;  Service: Cardiovascular;  Laterality: Right;  POPLITEALPTA SFA STENT  . SHUNTOGRAM Right 09/06/12   Thigh graft  . SP DECLOT AVGG Right Sep 14, 2012   Right thigh graft   Social History   Occupational History  . Occupation: retired    Associate Professor: RETIRED  Tobacco Use  . Smoking status: Former Smoker    Types: Cigarettes  . Smokeless tobacco: Never Used  Substance and Sexual Activity  . Alcohol use: No  . Drug use: No  . Sexual activity: Not Currently

## 2018-01-31 DIAGNOSIS — N2581 Secondary hyperparathyroidism of renal origin: Secondary | ICD-10-CM | POA: Diagnosis not present

## 2018-01-31 DIAGNOSIS — E1129 Type 2 diabetes mellitus with other diabetic kidney complication: Secondary | ICD-10-CM | POA: Diagnosis not present

## 2018-01-31 DIAGNOSIS — D631 Anemia in chronic kidney disease: Secondary | ICD-10-CM | POA: Diagnosis not present

## 2018-01-31 DIAGNOSIS — N186 End stage renal disease: Secondary | ICD-10-CM | POA: Diagnosis not present

## 2018-02-02 DIAGNOSIS — D631 Anemia in chronic kidney disease: Secondary | ICD-10-CM | POA: Diagnosis not present

## 2018-02-02 DIAGNOSIS — N186 End stage renal disease: Secondary | ICD-10-CM | POA: Diagnosis not present

## 2018-02-02 DIAGNOSIS — E1129 Type 2 diabetes mellitus with other diabetic kidney complication: Secondary | ICD-10-CM | POA: Diagnosis not present

## 2018-02-02 DIAGNOSIS — N2581 Secondary hyperparathyroidism of renal origin: Secondary | ICD-10-CM | POA: Diagnosis not present

## 2018-02-04 DIAGNOSIS — D631 Anemia in chronic kidney disease: Secondary | ICD-10-CM | POA: Diagnosis not present

## 2018-02-04 DIAGNOSIS — N2581 Secondary hyperparathyroidism of renal origin: Secondary | ICD-10-CM | POA: Diagnosis not present

## 2018-02-04 DIAGNOSIS — E1129 Type 2 diabetes mellitus with other diabetic kidney complication: Secondary | ICD-10-CM | POA: Diagnosis not present

## 2018-02-04 DIAGNOSIS — N186 End stage renal disease: Secondary | ICD-10-CM | POA: Diagnosis not present

## 2018-02-07 DIAGNOSIS — Z992 Dependence on renal dialysis: Secondary | ICD-10-CM | POA: Diagnosis not present

## 2018-02-07 DIAGNOSIS — N186 End stage renal disease: Secondary | ICD-10-CM | POA: Diagnosis not present

## 2018-02-07 DIAGNOSIS — D509 Iron deficiency anemia, unspecified: Secondary | ICD-10-CM | POA: Diagnosis not present

## 2018-02-07 DIAGNOSIS — E1129 Type 2 diabetes mellitus with other diabetic kidney complication: Secondary | ICD-10-CM | POA: Diagnosis not present

## 2018-02-07 DIAGNOSIS — D631 Anemia in chronic kidney disease: Secondary | ICD-10-CM | POA: Diagnosis not present

## 2018-02-07 DIAGNOSIS — N2581 Secondary hyperparathyroidism of renal origin: Secondary | ICD-10-CM | POA: Diagnosis not present

## 2018-02-07 DIAGNOSIS — Z23 Encounter for immunization: Secondary | ICD-10-CM | POA: Diagnosis not present

## 2018-02-09 DIAGNOSIS — N186 End stage renal disease: Secondary | ICD-10-CM | POA: Diagnosis not present

## 2018-02-09 DIAGNOSIS — D509 Iron deficiency anemia, unspecified: Secondary | ICD-10-CM | POA: Diagnosis not present

## 2018-02-09 DIAGNOSIS — Z23 Encounter for immunization: Secondary | ICD-10-CM | POA: Diagnosis not present

## 2018-02-09 DIAGNOSIS — E1129 Type 2 diabetes mellitus with other diabetic kidney complication: Secondary | ICD-10-CM | POA: Diagnosis not present

## 2018-02-09 DIAGNOSIS — N2581 Secondary hyperparathyroidism of renal origin: Secondary | ICD-10-CM | POA: Diagnosis not present

## 2018-02-09 DIAGNOSIS — D631 Anemia in chronic kidney disease: Secondary | ICD-10-CM | POA: Diagnosis not present

## 2018-02-10 ENCOUNTER — Encounter: Payer: Self-pay | Admitting: Podiatry

## 2018-02-10 ENCOUNTER — Ambulatory Visit (INDEPENDENT_AMBULATORY_CARE_PROVIDER_SITE_OTHER): Payer: Medicare Other | Admitting: Podiatry

## 2018-02-10 VITALS — Temp 97.9°F

## 2018-02-10 DIAGNOSIS — I739 Peripheral vascular disease, unspecified: Secondary | ICD-10-CM

## 2018-02-10 DIAGNOSIS — I70201 Unspecified atherosclerosis of native arteries of extremities, right leg: Secondary | ICD-10-CM

## 2018-02-10 DIAGNOSIS — L97409 Non-pressure chronic ulcer of unspecified heel and midfoot with unspecified severity: Secondary | ICD-10-CM | POA: Diagnosis not present

## 2018-02-10 DIAGNOSIS — I7025 Atherosclerosis of native arteries of other extremities with ulceration: Secondary | ICD-10-CM

## 2018-02-11 DIAGNOSIS — N186 End stage renal disease: Secondary | ICD-10-CM | POA: Diagnosis not present

## 2018-02-11 DIAGNOSIS — E1129 Type 2 diabetes mellitus with other diabetic kidney complication: Secondary | ICD-10-CM | POA: Diagnosis not present

## 2018-02-11 DIAGNOSIS — Z23 Encounter for immunization: Secondary | ICD-10-CM | POA: Diagnosis not present

## 2018-02-11 DIAGNOSIS — D509 Iron deficiency anemia, unspecified: Secondary | ICD-10-CM | POA: Diagnosis not present

## 2018-02-11 DIAGNOSIS — D631 Anemia in chronic kidney disease: Secondary | ICD-10-CM | POA: Diagnosis not present

## 2018-02-11 DIAGNOSIS — N2581 Secondary hyperparathyroidism of renal origin: Secondary | ICD-10-CM | POA: Diagnosis not present

## 2018-02-12 NOTE — Progress Notes (Signed)
Subjective: 84 year old female presents the office today with her nephew for follow-up evaluation of wound to the back of the left heel.  She states that she is having some pain to the area.  She is been continue Betadine wet-to-dry to the area daily.  She is to follow-up with Dr. Randie Heinz last saw her.   Angio results 01/23/2018 Findings: Aorta and iliac segments are free of flow-limiting stenosis however are heavily calcified.  There are multiple approximately 30% stenosis throughout her SFA popliteal segment her only runoff on the left leg is the peroneal artery and this is occluded first segment of approximately 6 cm.  After balloon angioplasty there is no further residual stenosis and no flow-limiting dissection.  The radial artery does give out at the ankle.  I could not cross a posterior tibial that is occluded there is only a takeoff of an anterior tibial.   Denies any systemic complaints such as fevers, chills, nausea, vomiting. No acute changes since last appointment, and no other complaints at this time.   Objective: AAO x3, NAD Neurovascular status unchanged. Right BKA On the left posterior heel with a large non-stagable eschar measuring about 6 x 4.5 cm. The eschar is unchanged. It is firmly adhered to the skin.  There is no drainage or pus identified and there is no fluctuation or crepitation or any malodor.  6 hyperpigmented changes to the forefoot. No pain with calf compression, swelling, warmth, erythema        Assessment: Left heel eschar, unstageable  Plan: -All treatment options discussed with the patient including all alternatives, risks, complications.  -The wound appears to be stable no signs of infection.  Continue Betadine wet-to-dry dressing changes for now.  Wound care referral was placed.  Since this offloading at all times.  Limit activity on the heel.  There is a long discussion today in regards to healing potential and try to save this leg.  Try to explain to her  that there is no signs of infection and she was asked about the antibiotic in order to "heal" this.  Unfortunately given circulation difficulties healed this wound needs aggressive local wound care.  Vivi Barrack DPM

## 2018-02-14 DIAGNOSIS — D509 Iron deficiency anemia, unspecified: Secondary | ICD-10-CM | POA: Diagnosis not present

## 2018-02-14 DIAGNOSIS — E1129 Type 2 diabetes mellitus with other diabetic kidney complication: Secondary | ICD-10-CM | POA: Diagnosis not present

## 2018-02-14 DIAGNOSIS — N2581 Secondary hyperparathyroidism of renal origin: Secondary | ICD-10-CM | POA: Diagnosis not present

## 2018-02-14 DIAGNOSIS — D631 Anemia in chronic kidney disease: Secondary | ICD-10-CM | POA: Diagnosis not present

## 2018-02-14 DIAGNOSIS — N186 End stage renal disease: Secondary | ICD-10-CM | POA: Diagnosis not present

## 2018-02-14 DIAGNOSIS — Z23 Encounter for immunization: Secondary | ICD-10-CM | POA: Diagnosis not present

## 2018-02-16 DIAGNOSIS — D631 Anemia in chronic kidney disease: Secondary | ICD-10-CM | POA: Diagnosis not present

## 2018-02-16 DIAGNOSIS — N186 End stage renal disease: Secondary | ICD-10-CM | POA: Diagnosis not present

## 2018-02-16 DIAGNOSIS — D509 Iron deficiency anemia, unspecified: Secondary | ICD-10-CM | POA: Diagnosis not present

## 2018-02-16 DIAGNOSIS — E1129 Type 2 diabetes mellitus with other diabetic kidney complication: Secondary | ICD-10-CM | POA: Diagnosis not present

## 2018-02-16 DIAGNOSIS — N2581 Secondary hyperparathyroidism of renal origin: Secondary | ICD-10-CM | POA: Diagnosis not present

## 2018-02-16 DIAGNOSIS — Z23 Encounter for immunization: Secondary | ICD-10-CM | POA: Diagnosis not present

## 2018-02-17 ENCOUNTER — Encounter

## 2018-02-17 ENCOUNTER — Encounter: Payer: Medicare Other | Admitting: Vascular Surgery

## 2018-02-18 DIAGNOSIS — D509 Iron deficiency anemia, unspecified: Secondary | ICD-10-CM | POA: Diagnosis not present

## 2018-02-18 DIAGNOSIS — N186 End stage renal disease: Secondary | ICD-10-CM | POA: Diagnosis not present

## 2018-02-18 DIAGNOSIS — D631 Anemia in chronic kidney disease: Secondary | ICD-10-CM | POA: Diagnosis not present

## 2018-02-18 DIAGNOSIS — N2581 Secondary hyperparathyroidism of renal origin: Secondary | ICD-10-CM | POA: Diagnosis not present

## 2018-02-18 DIAGNOSIS — Z23 Encounter for immunization: Secondary | ICD-10-CM | POA: Diagnosis not present

## 2018-02-18 DIAGNOSIS — E1129 Type 2 diabetes mellitus with other diabetic kidney complication: Secondary | ICD-10-CM | POA: Diagnosis not present

## 2018-02-20 ENCOUNTER — Encounter: Payer: Self-pay | Admitting: Adult Health

## 2018-02-20 ENCOUNTER — Non-Acute Institutional Stay (SKILLED_NURSING_FACILITY): Payer: Medicare Other | Admitting: Adult Health

## 2018-02-20 DIAGNOSIS — I7025 Atherosclerosis of native arteries of other extremities with ulceration: Secondary | ICD-10-CM

## 2018-02-20 DIAGNOSIS — Z992 Dependence on renal dialysis: Secondary | ICD-10-CM | POA: Diagnosis not present

## 2018-02-20 DIAGNOSIS — Z89511 Acquired absence of right leg below knee: Secondary | ICD-10-CM | POA: Diagnosis not present

## 2018-02-20 DIAGNOSIS — N186 End stage renal disease: Secondary | ICD-10-CM | POA: Diagnosis not present

## 2018-02-20 DIAGNOSIS — I4821 Permanent atrial fibrillation: Secondary | ICD-10-CM

## 2018-02-20 DIAGNOSIS — Z8673 Personal history of transient ischemic attack (TIA), and cerebral infarction without residual deficits: Secondary | ICD-10-CM

## 2018-02-20 DIAGNOSIS — G629 Polyneuropathy, unspecified: Secondary | ICD-10-CM | POA: Diagnosis not present

## 2018-02-20 DIAGNOSIS — I953 Hypotension of hemodialysis: Secondary | ICD-10-CM | POA: Diagnosis not present

## 2018-02-20 NOTE — Progress Notes (Signed)
Location:  [facility] Room Number: 220-A Place of Service:  SNF (31) Provider:  Kenard Gower, NP  Patient Care Team: Mila Palmer, MD as PCP - General (Family Medicine) Vivi Barrack, DPM as Consulting Physician (Podiatry) Terrial Rhodes, MD as Consulting Physician (Nephrology)  Extended Emergency Contact Information Primary Emergency Contact: Harris,Myrtle Address: [address]          [place], [place] Home Phone: [phone] Relation: Niece Secondary Emergency Contact: Mamie [place] Home Phone: [phone] Mobile Phone: [phone] Relation: Son  Code Status:  Full Code  Goals of care: Advanced Directive information Advanced Directives 01/23/2018  Does Patient Have a Medical Advance Directive? Yes  Type of Advance Directive Healthcare Power of Attorney  Does patient want to make changes to medical advance directive? No - Patient declined  Copy of Healthcare Power of Attorney in Chart? No - copy requested  Would patient like information on creating a medical advance directive? -  Pre-existing out of facility DNR order (yellow form or pink MOST form) -     Chief Complaint  Patient presents with  . Medical Management of Chronic Issues    Routine [facility] visit    HPI:  Pt is an 84 y.o. female seen today for medical management of chronic diseases.  She is a short-term rehabilitation resident of [facility].  She has a PMH of PAD, stroke, OA, secondary hyperparathyroidism, GERD, depression, and history of C. difficile diarrhea. She was seen in the room today. She was sleeping but woke up to verbal greetings. She did not complain of pain. HR has been rate-controlled.   Past Medical History:  Diagnosis Date  . Anemia   . C. difficile diarrhea   . Chronic kidney disease    on Dialysis  . Depression   . Diabetes mellitus   . Diabetic neuropathy (HCC)     . Gangrene (HCC)    right foot  . GERD (gastroesophageal reflux disease)   . Gout   . Hyperparathyroidism   . MRSA (methicillin resistant staph aureus) culture positive   . Obesity   . Osteoarthritis   . Peripheral arterial disease (HCC)    Gangrene-Left Great Toe  . Shortness of breath dyspnea    with exertion  . Stroke [facility])    Past Surgical History:  Procedure Laterality Date  . ABDOMINAL AORTOGRAM N/A 07/05/2016   Procedure: Abdominal Aortogram;  Surgeon: Maeola Harman, MD;  Location: [facility] INVASIVE CV LAB;  Service: Cardiovascular;  Laterality: N/A;  . ABDOMINAL AORTOGRAM N/A 06/24/2017   Procedure: ABDOMINAL AORTOGRAM;  Surgeon: Sherren Kerns, MD;  Location: [facility] INVASIVE CV LAB;  Service: Cardiovascular;  Laterality: N/A;  . ABDOMINAL AORTOGRAM W/LOWER EXTREMITY N/A 01/23/2018   Procedure: ABDOMINAL AORTOGRAM W/LOWER EXTREMITY - Left Side;  Surgeon: Maeola Harman, MD;  Location: [facility] INVASIVE CV LAB;  Service: Cardiovascular;  Laterality: N/A;  . ABDOMINAL HYSTERECTOMY    . AMPUTATION Right 11/11/2017   Procedure: RIGHT BELOW KNEE AMPUTATION;  Surgeon: Nadara Mustard, MD;  Location: [facility] OR;  Service: Orthopedics;  Laterality: Right;  . AV FISTULA PLACEMENT Left 07/07/2015   Procedure: ATTEMPTED INSERTION OFLEFT  ARTERIOVENOUS (AV) GORE-TEX GRAFT THIGH;  Surgeon: Fransisco Hertz, MD;  Location: [facility] OR;  Service: Vascular;  Laterality: Left;  . BELOW KNEE LEG AMPUTATION Right 11/11/2017  . BREAST EXCISIONAL BIOPSY Right 05/25/2006  . COLONOSCOPY    . LOWER EXTREMITY ANGIOGRAPHY Bilateral 07/05/2016  Procedure: Lower Extremity Angiography;  Surgeon: Maeola Harman, MD;  Location: [facility] INVASIVE CV LAB;  Service: Cardiovascular;  Laterality: Bilateral;  . LOWER EXTREMITY ANGIOGRAPHY Bilateral 06/24/2017   Procedure: Lower Extremity Angiography;  Surgeon: Sherren Kerns, MD;  Location: [facility] INVASIVE CV LAB;  Service: Cardiovascular;  Laterality: Bilateral;  .  MULTIPLE TOOTH EXTRACTIONS    . PERIPHERAL VASCULAR BALLOON ANGIOPLASTY Left 01/23/2018   Procedure: PERIPHERAL VASCULAR BALLOON ANGIOPLASTY;  Surgeon: Maeola Harman, MD;  Location: [facility] INVASIVE CV LAB;  Service: Cardiovascular;  Laterality: Left;  peroneal  . PERIPHERAL VASCULAR INTERVENTION Right 06/24/2017   Procedure: PERIPHERAL VASCULAR INTERVENTION;  Surgeon: Sherren Kerns, MD;  Location: [facility] INVASIVE CV LAB;  Service: Cardiovascular;  Laterality: Right;  POPLITEALPTA SFA STENT  . SHUNTOGRAM Right 09/06/12   Thigh graft  . SP DECLOT AVGG Right Sep 14, 2012   Right thigh graft    Allergies  Allergen Reactions  . Codeine Hives  . Vancomycin Rash    Outpatient Encounter Medications as of 02/20/2018  Medication Sig  . Amino Acids-Protein Hydrolys (FEEDING SUPPLEMENT, PRO-STAT SUGAR FREE 64,) LIQD Take 30 mLs by mouth daily.   Marland Kitchen amiodarone (PACERONE) 200 MG tablet Take 1 tablet (200 mg total) by mouth 2 (two) times daily.  Marland Kitchen apixaban (ELIQUIS) 2.5 MG TABS tablet Take 1 tablet (2.5 mg total) by mouth 2 (two) times daily.  . clopidogrel (PLAVIX) 75 MG tablet Take 1 tablet (75 mg total) by mouth daily.  . digoxin 62.5 MCG TABS Take 0.0625 mg by mouth daily.  Marland Kitchen gabapentin (NEURONTIN) 100 MG capsule Take 100 mg by mouth at bedtime.   . metoprolol tartrate (LOPRESSOR) 25 MG tablet Take 0.5 tablets (12.5 mg total) by mouth 2 (two) times daily.  . midodrine (PROAMATINE) 10 MG tablet Take 1 tablet (10 mg total) by mouth 2 (two) times daily with a meal.  . Nutritional Supplements (FEEDING SUPPLEMENT, NEPRO CARB STEADY,) LIQD Take 237 mLs by mouth daily.  Marland Kitchen oxyCODONE-acetaminophen (PERCOCET/ROXICET) 5-325 MG tablet Take 1 tablet by mouth every 4 (four) hours as needed.  Marland Kitchen RENVELA 800 MG tablet Take 1,600-2,400 mg by mouth See admin instructions. Take 2400 mg by mouth 3 times daily with meals and take 1600 mg by mouth with snacks  . SENSIPAR 30 MG tablet Take 30 mg by mouth every  evening.    Facility-Administered Encounter Medications as of 02/20/2018  Medication  . protamine injection 25 mg    Review of Systems  GENERAL: No change in appetite, no fatigue, no weight changes, no fever, chills or weakness MOUTH and THROAT: Denies oral discomfort, gingival pain or bleeding RESPIRATORY: no cough, SOB, DOE, wheezing, hemoptysis CARDIAC: No chest pain, edema or palpitations GI: No abdominal pain, diarrhea, constipation, heart burn, nausea or vomiting NEUROLOGICAL: Denies dizziness, syncope, numbness, or headache PSYCHIATRIC: Denies feelings of depression or anxiety. No report of hallucinations, insomnia, paranoia, or agitation    Immunization History  Administered Date(s) Administered  . Influenza Split 04/03/2014   Pertinent  Health Maintenance Due  Topic Date Due  . DEXA SCAN  10/09/1999  . PNA vac Low Risk Adult (1 of 2 - PCV13) 10/09/1999  . INFLUENZA VACCINE  12/08/2017  . HEMOGLOBIN A1C  07/10/2018  . FOOT EXAM  Discontinued  . OPHTHALMOLOGY EXAM  Discontinued  . URINE MICROALBUMIN  Discontinued   Fall Risk  07/11/2017 06/03/2014  Falls in the past year? No No   Functional Status Survey:    Vitals:  02/20/18 0946  BP: 110/64  Pulse: 64  Resp: 17  Temp: 98 F (36.7 C)  TempSrc: Oral  SpO2: 92%  Weight: 169 lb 9.9 oz (76.9 kg)  Height: 5\' 3"  (1.6 m)   Body mass index is 30.05 kg/m.  Physical Exam  GENERAL APPEARANCE: Well nourished. In no acute distress. Obese SKIN:  Left heel with black eschar MOUTH and THROAT: Lips are without lesions. Oral mucosa is moist and without lesions. Tongue is normal in shape, size, and color and without lesions RESPIRATORY: Breathing is even & unlabored, BS CTAB CARDIAC: RRR, no murmur,no extra heart sounds, no edema, Right chest with permcath GI: Abdomen soft, normal BS, no masses, no tenderness EXTREMITIES:  Right BKAincision healed  NEUROLOGICAL: There is no tremor. Speech is clear PSYCHIATRIC: Alert  and oriented X 3. Affect and behavior are appropriate  Labs reviewed: Recent Labs    11/15/17 0734  01/09/18 0300 01/10/18 0752 01/12/18 0513 01/23/18 0638  NA 135   < > 137 136 133* 138  K 4.2   < > 4.3 4.5 4.4 5.3*  CL 97*   < > 99 102 97* 98  CO2 27   < > 27 23 23   --   GLUCOSE 117*   < > 131* 124* 94 117*  BUN 42*   < > 46* 68* 51* 66*  CREATININE 8.88*   < > 6.20* 7.86* 6.59* 6.50*  CALCIUM 7.9*   < > 8.5* 8.2* 8.3*  --   MG  --   --   --   --  2.1  --   PHOS 4.9*  --   --  5.9* 4.8*  --    < > = values in this interval not displayed.   Recent Labs    01/08/18 0854 01/09/18 0300 01/10/18 0752 01/12/18 0513  AST 18 16  --   --   ALT 14 13  --   --   ALKPHOS 110 88  --   --   BILITOT 0.7 0.8  --   --   PROT 7.2 6.2*  --   --   ALBUMIN 3.1* 2.8* 2.6* 2.6*   Recent Labs    01/08/18 0854  01/10/18 0452 01/11/18 0442 01/12/18 0513 01/23/18 0638  WBC 10.2   < > 10.4 15.9* 12.4*  --   NEUTROABS 8.0*  --   --   --   --   --   HGB 11.2*   < > 10.4* 11.7* 10.5* 15.0  HCT 41.2   < > 36.4 41.9 36.1 44.0  MCV 83.9   < > 81.3 81.5 80.0  --   PLT 284   < > 257 247 264  --    < > = values in this interval not displayed.   Lab Results  Component Value Date   TSH 2.692 Test methodology is 3rd generation TSH 11/09/2006    Lab Results  Component Value Date   HGBA1C 5.5 01/09/2018   Lab Results  Component Value Date   CHOL 77 01/09/2018   HDL 46 01/09/2018   LDLCALC 22 01/09/2018   TRIG 44 01/09/2018   CHOLHDL 1.7 01/09/2018     Assessment/Plan  1. History of CVA (cerebrovascular accident) -stable, continue Plavix 75 mg 1 tab daily, metoprolol tartrate 25 mg 1/2 tab = 12.5 mg twice a day   2. Permanent atrial fibrillation - rate controlled, continue metoprolol tartrate 25 mg 1/2 tab = 12.5 mg twice a day, amiodarone  200 mg 1 tab twice a day, Eliquis 2.5 mg 1 tab twice a day, digoxin 125 mcg 1/2 tab = 62.5 mcg daily   3. Hemodialysis-associated hypotension -  stable, continue midodrine 10 mg 1 tab twice a day   4. Atherosclerosis of native arteries of the extremities with ulceration (HCC) -S/P aortogram with LLE angiogram, balloon angioplasty of left femoral artery, left heel with black eschar, followed by vascular surgery, treatment to left heel daily  5. ESRD on dialysis [facility]) -  Has right chest permcath, continue hemodialysis, 1500 mL fluid restriction daily, cinacalcet 30 mg 1 tab q. evening, Renvela 800 mg  3 tabs = 2,400 mg 3 times a day and 800 mg 1 tab p.o. with snacks   6. S/P unilateral BKA (below knee amputation), right (HCC) -incision healed, continue Percocet 5-325 mg 1 tab every 4 hours as needed, followed up by orthopedics  7.  Neuropathy -continue gabapentin 100 mg 1 capsule nightly    Family/ staff Communication: Discussed plan of care with patient.  Labs/tests ordered:  None  Goals of care:   Short-term rehabilitation.   Kenard Gower, NP [facility] [phone] (Monday-Friday 8:00 a.m. - 5:00 p.m.) [phone] (after hours)

## 2018-02-21 DIAGNOSIS — N2581 Secondary hyperparathyroidism of renal origin: Secondary | ICD-10-CM | POA: Diagnosis not present

## 2018-02-21 DIAGNOSIS — E1129 Type 2 diabetes mellitus with other diabetic kidney complication: Secondary | ICD-10-CM | POA: Diagnosis not present

## 2018-02-21 DIAGNOSIS — D631 Anemia in chronic kidney disease: Secondary | ICD-10-CM | POA: Diagnosis not present

## 2018-02-21 DIAGNOSIS — D509 Iron deficiency anemia, unspecified: Secondary | ICD-10-CM | POA: Diagnosis not present

## 2018-02-21 DIAGNOSIS — N186 End stage renal disease: Secondary | ICD-10-CM | POA: Diagnosis not present

## 2018-02-21 DIAGNOSIS — Z23 Encounter for immunization: Secondary | ICD-10-CM | POA: Diagnosis not present

## 2018-02-21 IMAGING — CR DG CHEST 2V
2 series · 2 of 2 positions shown · non-contrast
Comparison: 12/19/2014

CLINICAL DATA: Pt has nonhealing wound to great toe of right foot,
being assessed for hyperbaric oxygen chamber treatment.

EXAM:
CHEST  2 VIEW

[w chest pa]
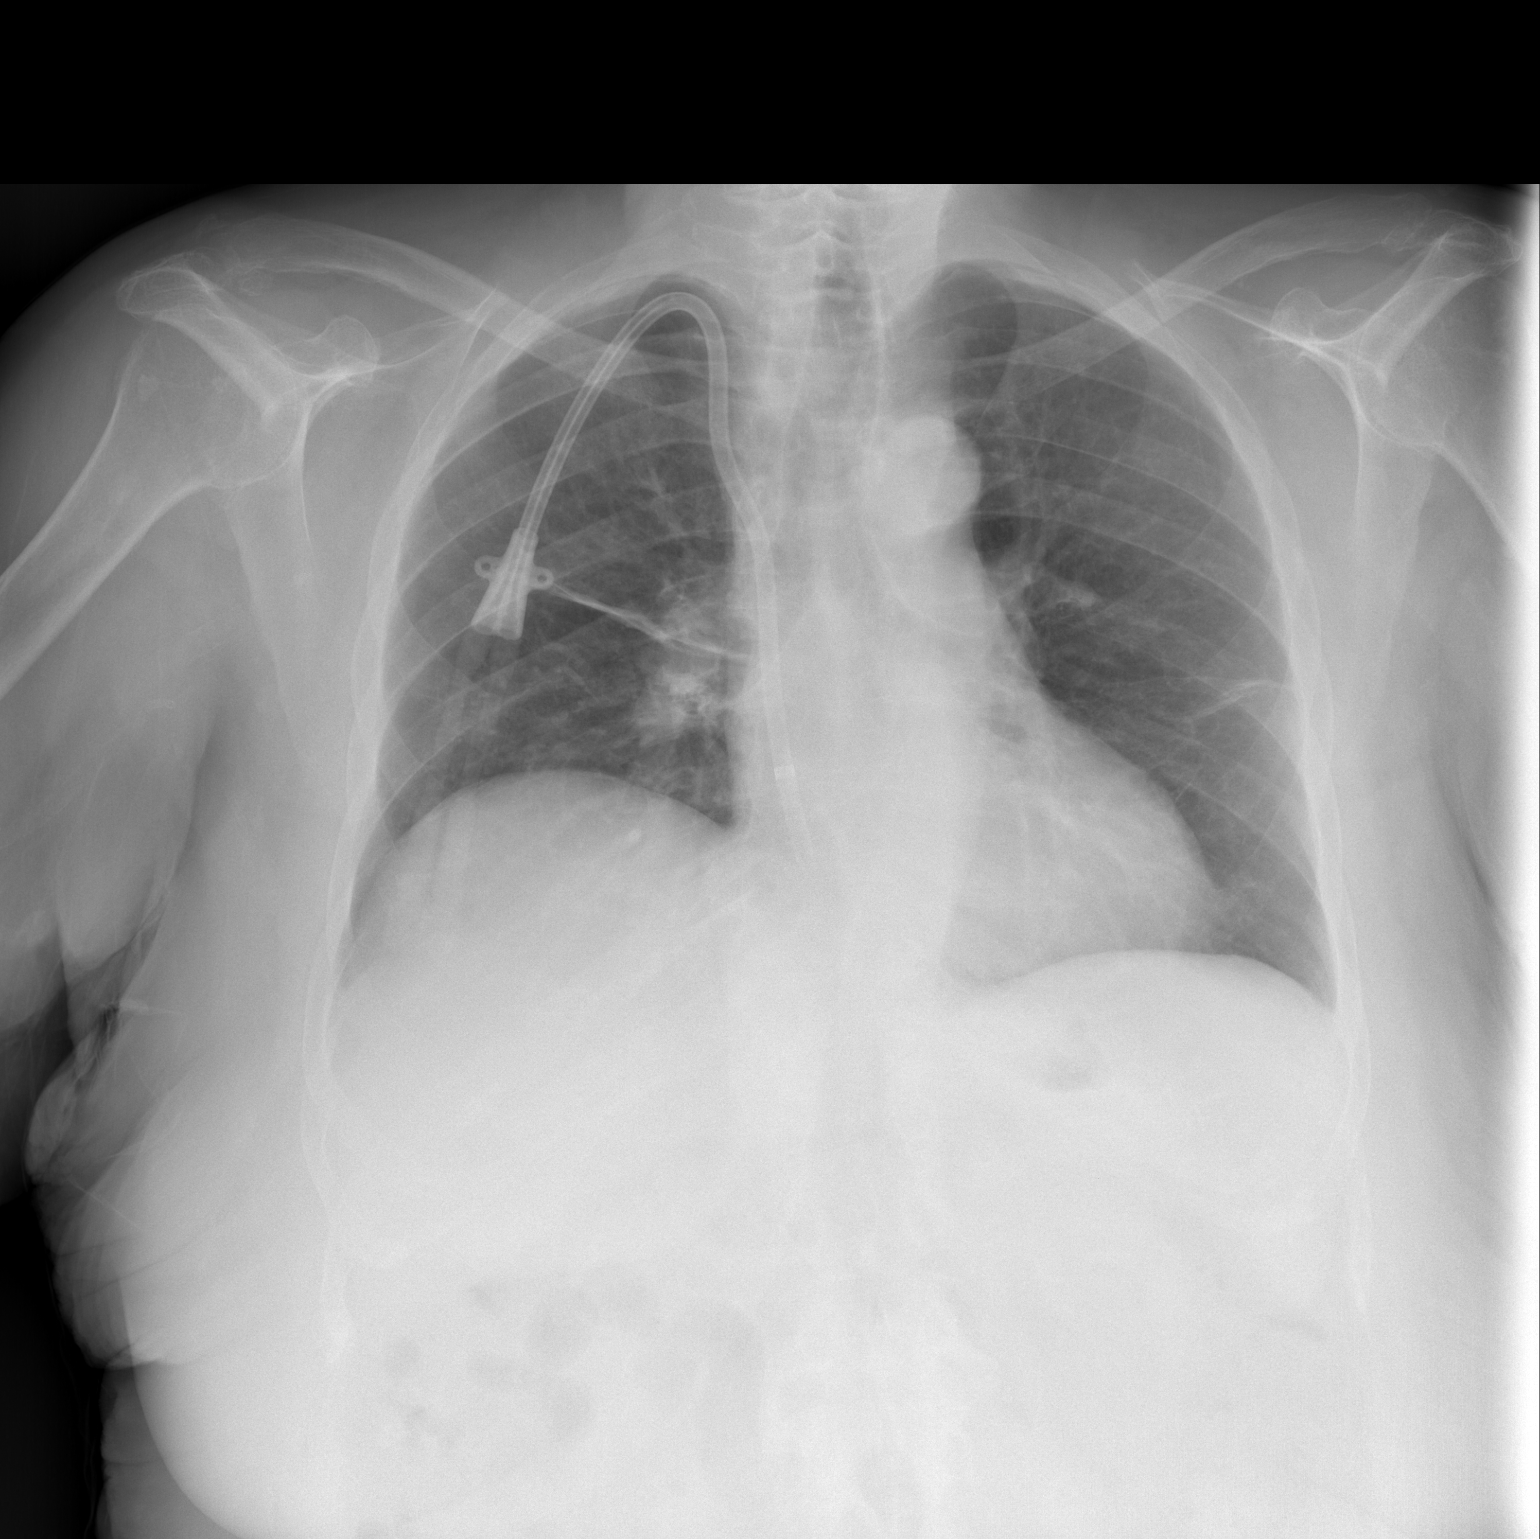

[w chest lat]
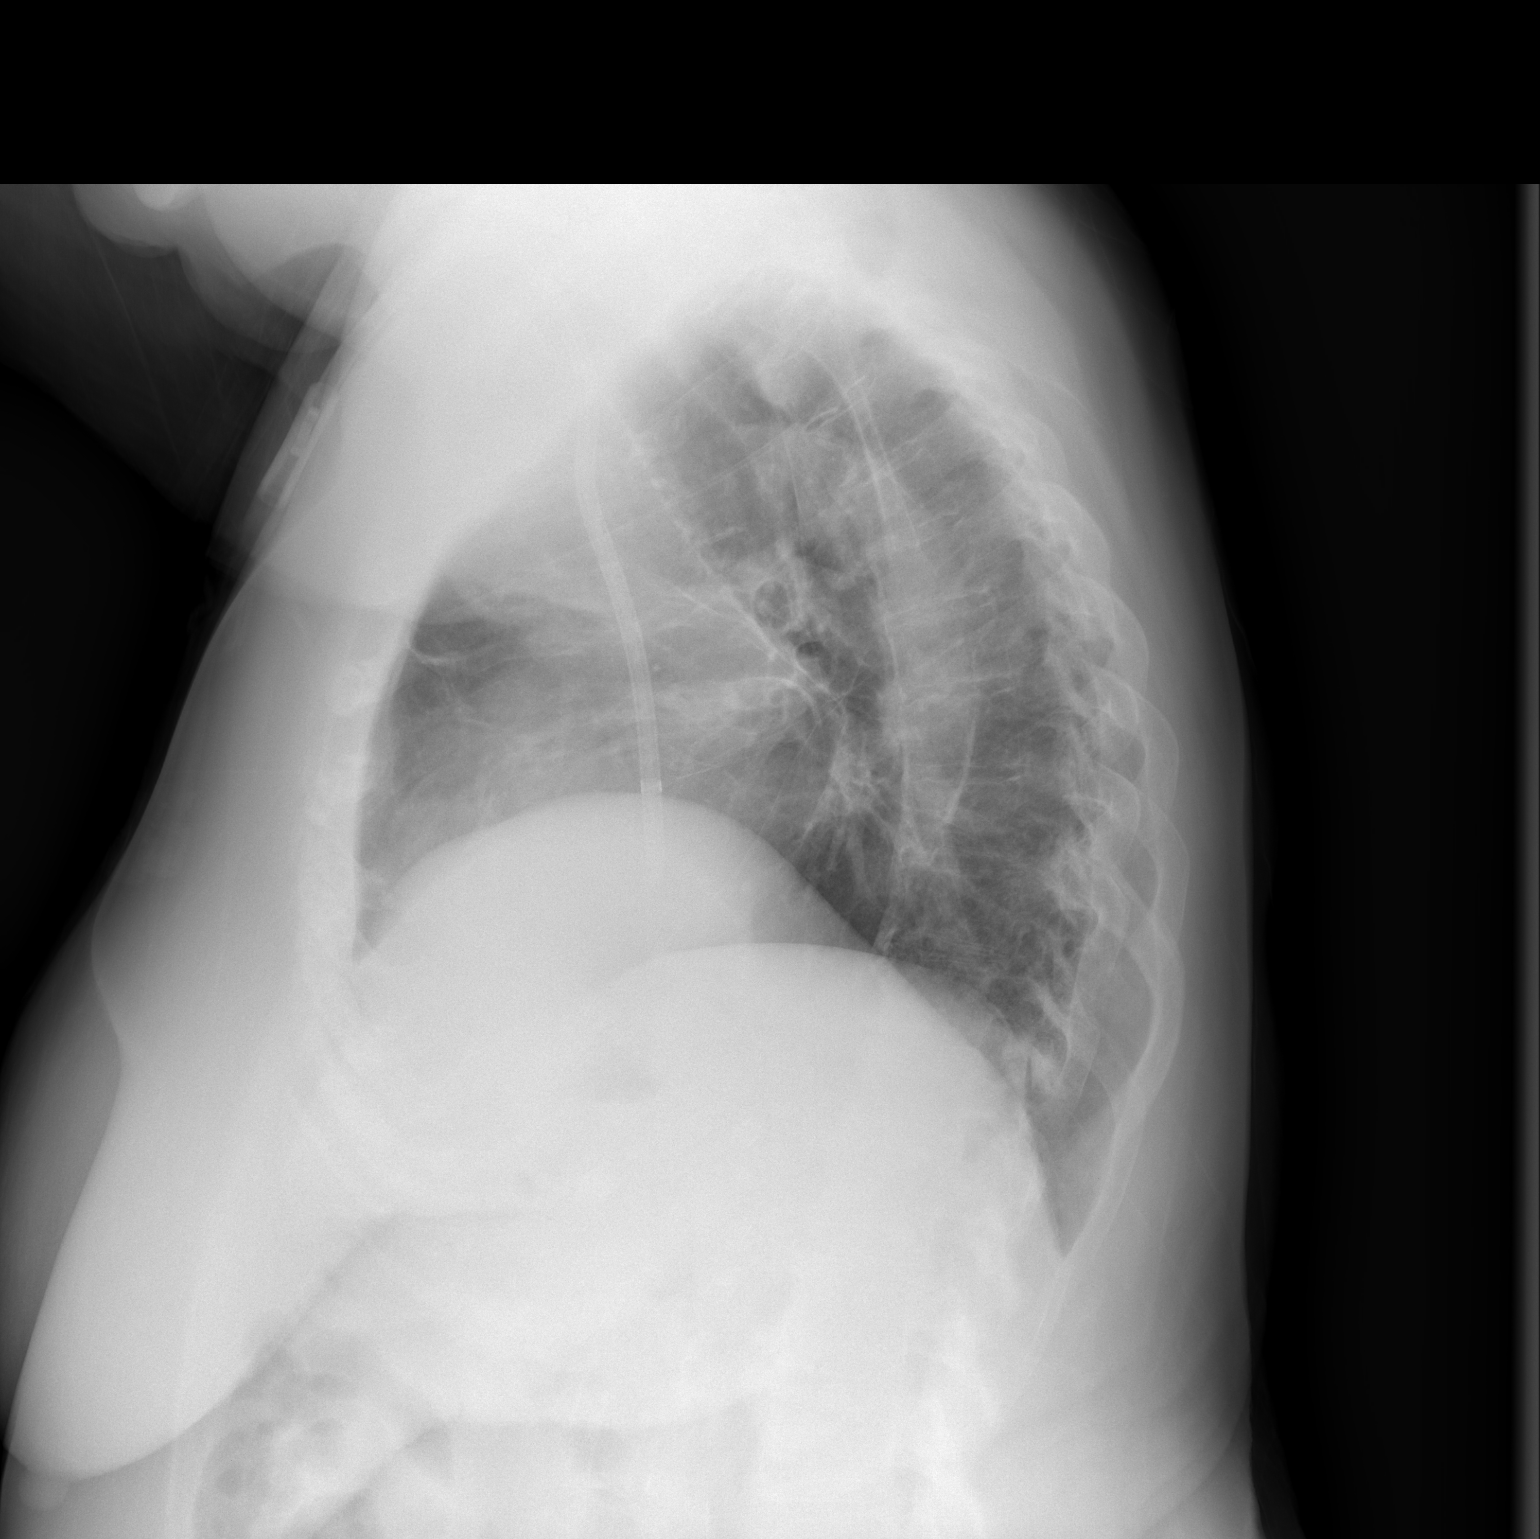

[2 of 2 positions shown; findings below may reference images not displayed]

FINDINGS: There is mild chronic elevation of the right diaphragm. There is no
focal parenchymal opacity. There is no pleural effusion or
pneumothorax. The heart and mediastinal contours are unremarkable.
There is a dual lumen right-sided central venous catheter with the
tip projecting over the right atrium.

The osseous structures are unremarkable.
IMPRESSION: No active cardiopulmonary disease.

## 2018-02-23 DIAGNOSIS — N186 End stage renal disease: Secondary | ICD-10-CM | POA: Diagnosis not present

## 2018-02-23 DIAGNOSIS — Z23 Encounter for immunization: Secondary | ICD-10-CM | POA: Diagnosis not present

## 2018-02-23 DIAGNOSIS — E1129 Type 2 diabetes mellitus with other diabetic kidney complication: Secondary | ICD-10-CM | POA: Diagnosis not present

## 2018-02-23 DIAGNOSIS — N2581 Secondary hyperparathyroidism of renal origin: Secondary | ICD-10-CM | POA: Diagnosis not present

## 2018-02-23 DIAGNOSIS — D509 Iron deficiency anemia, unspecified: Secondary | ICD-10-CM | POA: Diagnosis not present

## 2018-02-23 DIAGNOSIS — D631 Anemia in chronic kidney disease: Secondary | ICD-10-CM | POA: Diagnosis not present

## 2018-02-24 ENCOUNTER — Non-Acute Institutional Stay (SKILLED_NURSING_FACILITY): Payer: Medicare Other | Admitting: Adult Health

## 2018-02-24 ENCOUNTER — Encounter: Payer: Self-pay | Admitting: Adult Health

## 2018-02-24 DIAGNOSIS — I4821 Permanent atrial fibrillation: Secondary | ICD-10-CM

## 2018-02-24 DIAGNOSIS — I953 Hypotension of hemodialysis: Secondary | ICD-10-CM | POA: Diagnosis not present

## 2018-02-24 DIAGNOSIS — G459 Transient cerebral ischemic attack, unspecified: Secondary | ICD-10-CM | POA: Diagnosis not present

## 2018-02-24 DIAGNOSIS — G629 Polyneuropathy, unspecified: Secondary | ICD-10-CM

## 2018-02-24 DIAGNOSIS — L89620 Pressure ulcer of left heel, unstageable: Secondary | ICD-10-CM

## 2018-02-24 DIAGNOSIS — Z992 Dependence on renal dialysis: Secondary | ICD-10-CM

## 2018-02-24 DIAGNOSIS — N186 End stage renal disease: Secondary | ICD-10-CM | POA: Diagnosis not present

## 2018-02-24 MED ORDER — CLOPIDOGREL BISULFATE 75 MG PO TABS: 75.0000 mg | ORAL_TABLET | Freq: Every day | ORAL | 0 refills | Status: DC

## 2018-02-24 MED ORDER — APIXABAN 2.5 MG PO TABS: 2.5000 mg | ORAL_TABLET | Freq: Two times a day (BID) | ORAL | 0 refills | Status: AC

## 2018-02-24 MED ORDER — CINACALCET HCL 30 MG PO TABS: 30.0000 mg | ORAL_TABLET | Freq: Every evening | ORAL | 0 refills | Status: AC

## 2018-02-24 MED ORDER — RENVELA 800 MG PO TABS: 1600.0000 mg | ORAL_TABLET | ORAL | 0 refills | Status: AC

## 2018-02-24 MED ORDER — OXYCODONE-ACETAMINOPHEN 5-325 MG PO TABS: 1.0000 | ORAL_TABLET | Freq: Four times a day (QID) | ORAL | 0 refills | Status: AC | PRN

## 2018-02-24 MED ORDER — METOPROLOL TARTRATE 25 MG PO TABS: 12.5000 mg | ORAL_TABLET | Freq: Two times a day (BID) | ORAL | 0 refills | Status: DC

## 2018-02-24 MED ORDER — DIGOXIN 62.5 MCG PO TABS: 0.0625 mg | ORAL_TABLET | Freq: Every day | ORAL | 0 refills | Status: DC

## 2018-02-24 MED ORDER — GABAPENTIN 100 MG PO CAPS: 100.0000 mg | ORAL_CAPSULE | Freq: Every day | ORAL | 0 refills | Status: AC

## 2018-02-24 MED ORDER — AMIODARONE HCL 200 MG PO TABS: 200.0000 mg | ORAL_TABLET | Freq: Two times a day (BID) | ORAL | 0 refills | Status: AC

## 2018-02-24 NOTE — Progress Notes (Signed)
Location:  [facility] Room Number: 220-A Place of Service:  SNF (31) Provider:  Kenard Gower, NP  Patient Care Team: Mila Palmer, MD as PCP - General (Family Medicine) Vivi Barrack, DPM as Consulting Physician (Podiatry) Terrial Rhodes, MD as Consulting Physician (Nephrology)  Extended Emergency Contact Information Primary Emergency Contact: Harris,Myrtle Address: [address]          [place], [place] Home Phone: [phone] Relation: Niece Secondary Emergency Contact: Mamie La[place] Home Phone: [phone] Mobile Phone: [phone] Relation: Son  Code Status:  Full Code  Goals of care: Advanced Directive information Advanced Directives 01/23/2018  Does Patient Have a Medical Advance Directive? Yes  Type of Advance Directive Healthcare Power of Attorney  Does patient want to make changes to medical advance directive? No - Patient declined  Copy of Healthcare Power of Attorney in Chart? No - copy requested  Would patient like information on creating a medical advance directive? -  Pre-existing out of facility DNR order (yellow form or pink MOST form) -     Chief Complaint  Patient presents with  . Discharge Note    Patient is seen for a discharge visit, is discharging on 02/27/18    HPI:  Pt is an 84 y.o. female seen today for a discharge visit.  She is to discharge home on 02/27/18 with home health PT, OT, and Nurse.    On 9/16 she had aortogram with left lower extremity angiogram.  Findings showed aorta and iliac segments are free of flow limiting stenosis however are heavily calcified.  There are multiple approximately 30% stenosis throughout her SFA popliteal segment her only runoff on the left leg is the peroneal artery and this is occluded first segment of approximately 6 cm. After balloon angioplasty there is no further residual stenosis and no flow-limiting dissection.  The  radial artery does give out at the ankle.  They could not cross the posterior tibial that is occluded, there is only at the takeoff of anterior tibial. She was last seen by podiatry, Dr. Ardelle Anton, on 02/10/18, and evaluated left heel. Wound care referral was made.  She has been admitted to [facility] on 01/12/18 from a recent hospitalization for a TIA- like symptoms with history of CVA.  MRI brain and MRA of head and neck showed no evidence of infarction.  She was noted to be in atrial fibrillation and symptoms were felt to be likely secondary to her atrial fibrillation with cardioembolic TIA.  Echo showed an EF of 45 to 50%, carotid ultrasound showed only 1 to 39% stenosis of left ICA.  She was started on midodrine due to hypotension.  Cardiology was consulted and she was started on amiodarone, digoxin and apixaban. She has a PMH of PAD, stroke, OA, secondary hyperparathyroidism, GERD, depression, and C. difficile diarrhea.   Patient was admitted to this facility for short-term rehabilitation after the patient's recent hospitalization.  Patient has completed SNF rehabilitation and therapy has cleared the patient for discharge.   Past Medical History:  Diagnosis Date  . Anemia   . C. difficile diarrhea   . Chronic kidney disease    on Dialysis  . Depression   . Diabetes mellitus   . Diabetic neuropathy (HCC)   . Gangrene (HCC)    right foot  . GERD (gastroesophageal reflux disease)   . Gout   . Hyperparathyroidism   . MRSA (methicillin resistant staph aureus) culture positive   .  Obesity   . Osteoarthritis   . Peripheral arterial disease (HCC)    Gangrene-Left Great Toe  . Shortness of breath dyspnea    with exertion  . Stroke [facility])    Past Surgical History:  Procedure Laterality Date  . ABDOMINAL AORTOGRAM N/A 07/05/2016   Procedure: Abdominal Aortogram;  Surgeon: Maeola Harman, MD;  Location: [facility] INVASIVE CV LAB;  Service: Cardiovascular;   Laterality: N/A;  . ABDOMINAL AORTOGRAM N/A 06/24/2017   Procedure: ABDOMINAL AORTOGRAM;  Surgeon: Sherren Kerns, MD;  Location: [facility] INVASIVE CV LAB;  Service: Cardiovascular;  Laterality: N/A;  . ABDOMINAL AORTOGRAM W/LOWER EXTREMITY N/A 01/23/2018   Procedure: ABDOMINAL AORTOGRAM W/LOWER EXTREMITY - Left Side;  Surgeon: Maeola Harman, MD;  Location: [facility] INVASIVE CV LAB;  Service: Cardiovascular;  Laterality: N/A;  . ABDOMINAL HYSTERECTOMY    . AMPUTATION Right 11/11/2017   Procedure: RIGHT BELOW KNEE AMPUTATION;  Surgeon: Nadara Mustard, MD;  Location[facility] OR;  Service: Orthopedics;  Laterality: Right;  . AV FISTULA PLACEMENT Left 07/07/2015   Procedure: ATTEMPTED INSERTION OFLEFT  ARTERIOVENOUS (AV) GORE-TEX GRAFT THIGH;  Surgeon: Fransisco Hertz, MD;  Location: [facility] OR;  Service: Vascular;  Laterality: Left;  . BELOW KNEE LEG AMPUTATION Right 11/11/2017  . BREAST EXCISIONAL BIOPSY Right 05/25/2006  . COLONOSCOPY    . LOWER EXTREMITY ANGIOGRAPHY Bilateral 07/05/2016   Procedure: Lower Extremity Angiography;  Surgeon: Maeola Harman, MD;  Location: [facility] INVASIVE CV LAB;  Service: Cardiovascular;  Laterality: Bilateral;  . LOWER EXTREMITY ANGIOGRAPHY Bilateral 06/24/2017   Procedure: Lower Extremity Angiography;  Surgeon: Sherren Kerns, MD;  Location: [facility] INVASIVE CV LAB;  Service: Cardiovascular;  Laterality: Bilateral;  . MULTIPLE TOOTH EXTRACTIONS    . PERIPHERAL VASCULAR BALLOON ANGIOPLASTY Left 01/23/2018   Procedure: PERIPHERAL VASCULAR BALLOON ANGIOPLASTY;  Surgeon: Maeola Harman, MD;  Location: [facility] INVASIVE CV LAB;  Service: Cardiovascular;  Laterality: Left;  peroneal  . PERIPHERAL VASCULAR INTERVENTION Right 06/24/2017   Procedure: PERIPHERAL VASCULAR INTERVENTION;  Surgeon: Sherren Kerns, MD;  Location: [facility] INVASIVE CV LAB;  Service: Cardiovascular;  Laterality: Right;  POPLITEALPTA SFA STENT  . SHUNTOGRAM Right 09/06/12   Thigh graft  . SP DECLOT AVGG  Right Sep 14, 2012   Right thigh graft    Allergies  Allergen Reactions  . Codeine Hives  . Vancomycin Rash    Outpatient Encounter Medications as of 02/24/2018  Medication Sig  . Amino Acids-Protein Hydrolys (FEEDING SUPPLEMENT, PRO-STAT SUGAR FREE 64,) LIQD Take 30 mLs by mouth daily.   Marland Kitchen amiodarone (PACERONE) 200 MG tablet Take 1 tablet (200 mg total) by mouth 2 (two) times daily.  Marland Kitchen apixaban (ELIQUIS) 2.5 MG TABS tablet Take 1 tablet (2.5 mg total) by mouth 2 (two) times daily.  . clopidogrel (PLAVIX) 75 MG tablet Take 1 tablet (75 mg total) by mouth daily.  . digoxin 62.5 MCG TABS Take 0.0625 mg by mouth daily.  Marland Kitchen gabapentin (NEURONTIN) 100 MG capsule Take 100 mg by mouth at bedtime.   . metoprolol tartrate (LOPRESSOR) 25 MG tablet Take 0.5 tablets (12.5 mg total) by mouth 2 (two) times daily.  . midodrine (PROAMATINE) 10 MG tablet Take 1 tablet (10 mg total) by mouth 2 (two) times daily with a meal.  . Nutritional Supplements (FEEDING SUPPLEMENT, NEPRO CARB STEADY,) LIQD Take 237 mLs by mouth daily.  Marland Kitchen oxyCODONE-acetaminophen (PERCOCET/ROXICET) 5-325 MG tablet Take 1 tablet by mouth every 4 (four) hours as needed.  Marland Kitchen RENVELA 800 MG tablet  Take 1,600-2,400 mg by mouth See admin instructions. Take 2400 mg by mouth 3 times daily with meals and take 1600 mg by mouth with snacks  . SENSIPAR 30 MG tablet Take 30 mg by mouth every evening.    Facility-Administered Encounter Medications as of 02/24/2018  Medication  . protamine injection 25 mg    Review of Systems  GENERAL: No change in appetite, no fatigue, no weight changes, no fever, chills or weakness MOUTH and THROAT: Denies oral discomfort, gingival pain or bleeding RESPIRATORY: no cough, SOB, DOE, wheezing, hemoptysis CARDIAC: No chest pain, edema or palpitations GI: No abdominal pain, diarrhea, constipation, heart burn, nausea or vomiting PSYCHIATRIC: Denies feelings of depression or anxiety. No report of hallucinations,  insomnia, paranoia, or agitation    Immunization History  Administered Date(s) Administered  . Influenza Split 04/03/2014   Pertinent  Health Maintenance Due  Topic Date Due  . DEXA SCAN  10/09/1999  . PNA vac Low Risk Adult (1 of 2 - PCV13) 10/09/1999  . INFLUENZA VACCINE  12/08/2017  . HEMOGLOBIN A1C  07/10/2018  . FOOT EXAM  Discontinued  . OPHTHALMOLOGY EXAM  Discontinued  . URINE MICROALBUMIN  Discontinued   Fall Risk  07/11/2017 06/03/2014  Falls in the past year? No No      Vitals:   02/24/18 1304  BP: 125/63  Pulse: 72  Resp: 18  Temp: 98.8 F (37.1 C)  TempSrc: Oral  Weight: 169 lb 9.6 oz (76.9 kg)  Height: 5\' 3"  (1.6 m)   Body mass index is 30.04 kg/m.  Physical Exam  GENERAL APPEARANCE: Well nourished. In no acute distress. Obese SKIN:  Left heel has black necrotic tissues MOUTH and THROAT: Lips are without lesions. Oral mucosa is moist and without lesions. Tongue is normal in shape, size, and color and without lesions RESPIRATORY: Breathing is even & unlabored, BS CTAB CARDIAC: RRR, no murmur,no extra heart sounds, no edema, dialysis catheter on right chest GI: Abdomen soft, normal BS, no masses, no tenderness EXTREMITIES:  Able to move X 4 extremities, right BKA NEUROLOGICAL: There is no tremor. Speech is clear PSYCHIATRIC: Alert and oriented X 3. Affect and behavior are appropriate   Labs reviewed: Recent Labs    11/15/17 0734  01/09/18 0300 01/10/18 0752 01/12/18 0513 01/23/18 0638  NA 135   < > 137 136 133* 138  K 4.2   < > 4.3 4.5 4.4 5.3*  CL 97*   < > 99 102 97* 98  CO2 27   < > 27 23 23   --   GLUCOSE 117*   < > 131* 124* 94 117*  BUN 42*   < > 46* 68* 51* 66*  CREATININE 8.88*   < > 6.20* 7.86* 6.59* 6.50*  CALCIUM 7.9*   < > 8.5* 8.2* 8.3*  --   MG  --   --   --   --  2.1  --   PHOS 4.9*  --   --  5.9* 4.8*  --    < > = values in this interval not displayed.   Recent Labs    01/08/18 0854 01/09/18 0300 01/10/18 0752  01/12/18 0513  AST 18 16  --   --   ALT 14 13  --   --   ALKPHOS 110 88  --   --   BILITOT 0.7 0.8  --   --   PROT 7.2 6.2*  --   --   ALBUMIN 3.1* 2.8* 2.6*  2.6*   Recent Labs    01/08/18 0854  01/10/18 0452 01/11/18 0442 01/12/18 0513 01/23/18 0638  WBC 10.2   < > 10.4 15.9* 12.4*  --   NEUTROABS 8.0*  --   --   --   --   --   HGB 11.2*   < > 10.4* 11.7* 10.5* 15.0  HCT 41.2   < > 36.4 41.9 36.1 44.0  MCV 83.9   < > 81.3 81.5 80.0  --   PLT 284   < > 257 247 264  --    < > = values in this interval not displayed.   Lab Results  Component Value Date   TSH 2.692 Test methodology is 3rd generation TSH 11/09/2006   Lab Results  Component Value Date   HGBA1C 5.5 01/09/2018   Lab Results  Component Value Date   CHOL 77 01/09/2018   HDL 46 01/09/2018   LDLCALC 22 01/09/2018   TRIG 44 01/09/2018   CHOLHDL 1.7 01/09/2018    Assessment/Plan  1. TIA (transient ischemic attack) - stable - clopidogrel (PLAVIX) 75 MG tablet; Take 1 tablet (75 mg total) by mouth daily.  Dispense: 30 tablet; Refill: 0 - Digoxin 62.5 MCG TABS; Take 0.0625 mg by mouth daily.  Dispense: 30 tablet; Refill: 0  2. Permanent atrial fibrillation -rate controlled - amiodarone (PACERONE) 200 MG tablet; Take 1 tablet (200 mg total) by mouth 2 (two) times daily.  Dispense: 60 tablet; Refill: 0 - apixaban (ELIQUIS) 2.5 MG TABS tablet; Take 1 tablet (2.5 mg total) by mouth 2 (two) times daily.  Dispense: 60 tablet; Refill: 0 - metoprolol tartrate (LOPRESSOR) 25 MG tablet; Take 0.5 tablets (12.5 mg total) by mouth 2 (two) times daily.  Dispense: 60 tablet; Refill: 0  3. ESRD on dialysis [facility]) -hemodialysis 3 times a week, 1500 mL/day fluid restrictions - RENVELA 800 MG tablet; Take 2-3 tablets (1,600-2,400 mg total) by mouth See admin instructions. Take 2400 mg by mouth 3 times daily with meals and take 1600 mg by mouth with snacks  Dispense: 390 tablet; Refill: 0 - cinacalcet (SENSIPAR) 30 MG tablet; Take 1  tablet (30 mg total) by mouth every evening.  Dispense: 30 tablet; Refill: 0  4. Pressure injury of left heel, unstageable (HCC) -was referred by Dr. Ardelle Anton for wound care, will continue Betadine wet-to-dry to left heel, limit activity on the heel - oxyCODONE-acetaminophen (PERCOCET/ROXICET) 5-325 MG tablet; Take 1 tablet by mouth every 6 (six) hours as needed.  Dispense: 30 tablet; Refill: 0  5. Hemodialysis-associated hypotension -continue midodrine 10 mg 1 tab 2 times a day with meals   6. Neuropathy - gabapentin (NEURONTIN) 100 MG capsule; Take 1 capsule (100 mg total) by mouth at bedtime.  Dispense: 30 capsule; Refill: 0    I have filled out patient's discharge paperwork and written prescriptions.  Patient will receive home health PT, OT, and Nursing.  DME provided: Wheelchair, hospital bed, [employer] lift, sliding board, chuck pads, and incontinence briefs  Total discharge time: Greater than 30 minutes Greater than 50% was spent in counseling and coordination of care.  Discharge time involved coordination of the discharge process with social worker, nursing staff and therapy department. Medical justification for home health services/DME verified.   Kenard Gower, NP [facility] [phone] (Monday-Friday 8:00 a.m. - 5:00 p.m.) [phone] (after hours)

## 2018-02-25 DIAGNOSIS — Z23 Encounter for immunization: Secondary | ICD-10-CM | POA: Diagnosis not present

## 2018-02-25 DIAGNOSIS — D631 Anemia in chronic kidney disease: Secondary | ICD-10-CM | POA: Diagnosis not present

## 2018-02-25 DIAGNOSIS — N2581 Secondary hyperparathyroidism of renal origin: Secondary | ICD-10-CM | POA: Diagnosis not present

## 2018-02-25 DIAGNOSIS — N186 End stage renal disease: Secondary | ICD-10-CM | POA: Diagnosis not present

## 2018-02-25 DIAGNOSIS — D509 Iron deficiency anemia, unspecified: Secondary | ICD-10-CM | POA: Diagnosis not present

## 2018-02-25 DIAGNOSIS — E1129 Type 2 diabetes mellitus with other diabetic kidney complication: Secondary | ICD-10-CM | POA: Diagnosis not present

## 2018-02-27 ENCOUNTER — Inpatient Hospital Stay (HOSPITAL_COMMUNITY)
Admission: EM | Admit: 2018-02-27 | Discharge: 2018-03-10 | DRG: 239 | Disposition: A | Discharge status: Home Health | Payer: Medicare Other | Attending: Internal Medicine | Admitting: Internal Medicine

## 2018-02-27 ENCOUNTER — Encounter (HOSPITAL_COMMUNITY): Payer: Self-pay | Admitting: Emergency Medicine

## 2018-02-27 ENCOUNTER — Encounter: Payer: Self-pay | Admitting: Podiatry

## 2018-02-27 ENCOUNTER — Ambulatory Visit (INDEPENDENT_AMBULATORY_CARE_PROVIDER_SITE_OTHER): Payer: Medicare Other | Admitting: Podiatry

## 2018-02-27 ENCOUNTER — Ambulatory Visit (INDEPENDENT_AMBULATORY_CARE_PROVIDER_SITE_OTHER): Payer: Medicare Other

## 2018-02-27 ENCOUNTER — Telehealth: Payer: Self-pay | Admitting: *Deleted

## 2018-02-27 ENCOUNTER — Ambulatory Visit (INDEPENDENT_AMBULATORY_CARE_PROVIDER_SITE_OTHER): Payer: Medicare Other | Admitting: Physician Assistant

## 2018-02-27 ENCOUNTER — Encounter (INDEPENDENT_AMBULATORY_CARE_PROVIDER_SITE_OTHER): Payer: Self-pay | Admitting: Orthopedic Surgery

## 2018-02-27 VITALS — Ht 63.0 in | Wt 169.6 lb

## 2018-02-27 DIAGNOSIS — Z66 Do not resuscitate: Secondary | ICD-10-CM | POA: Diagnosis not present

## 2018-02-27 DIAGNOSIS — N186 End stage renal disease: Secondary | ICD-10-CM

## 2018-02-27 DIAGNOSIS — K529 Noninfective gastroenteritis and colitis, unspecified: Secondary | ICD-10-CM | POA: Diagnosis present

## 2018-02-27 DIAGNOSIS — M8610 Other acute osteomyelitis, unspecified site: Secondary | ICD-10-CM | POA: Diagnosis not present

## 2018-02-27 DIAGNOSIS — Z7902 Long term (current) use of antithrombotics/antiplatelets: Secondary | ICD-10-CM | POA: Diagnosis not present

## 2018-02-27 DIAGNOSIS — L89152 Pressure ulcer of sacral region, stage 2: Secondary | ICD-10-CM | POA: Diagnosis present

## 2018-02-27 DIAGNOSIS — Z7901 Long term (current) use of anticoagulants: Secondary | ICD-10-CM

## 2018-02-27 DIAGNOSIS — I70262 Atherosclerosis of native arteries of extremities with gangrene, left leg: Secondary | ICD-10-CM | POA: Diagnosis not present

## 2018-02-27 DIAGNOSIS — Z89511 Acquired absence of right leg below knee: Secondary | ICD-10-CM

## 2018-02-27 DIAGNOSIS — I959 Hypotension, unspecified: Secondary | ICD-10-CM | POA: Diagnosis not present

## 2018-02-27 DIAGNOSIS — Z881 Allergy status to other antibiotic agents status: Secondary | ICD-10-CM | POA: Diagnosis not present

## 2018-02-27 DIAGNOSIS — E1140 Type 2 diabetes mellitus with diabetic neuropathy, unspecified: Secondary | ICD-10-CM | POA: Diagnosis present

## 2018-02-27 DIAGNOSIS — K5730 Diverticulosis of large intestine without perforation or abscess without bleeding: Secondary | ICD-10-CM | POA: Diagnosis not present

## 2018-02-27 DIAGNOSIS — Z6832 Body mass index (BMI) 32.0-32.9, adult: Secondary | ICD-10-CM

## 2018-02-27 DIAGNOSIS — L97409 Non-pressure chronic ulcer of unspecified heel and midfoot with unspecified severity: Secondary | ICD-10-CM

## 2018-02-27 DIAGNOSIS — D631 Anemia in chronic kidney disease: Secondary | ICD-10-CM | POA: Diagnosis present

## 2018-02-27 DIAGNOSIS — I10 Essential (primary) hypertension: Secondary | ICD-10-CM | POA: Diagnosis present

## 2018-02-27 DIAGNOSIS — I7025 Atherosclerosis of native arteries of other extremities with ulceration: Secondary | ICD-10-CM | POA: Diagnosis not present

## 2018-02-27 DIAGNOSIS — N2581 Secondary hyperparathyroidism of renal origin: Secondary | ICD-10-CM | POA: Diagnosis present

## 2018-02-27 DIAGNOSIS — A480 Gas gangrene: Secondary | ICD-10-CM | POA: Diagnosis not present

## 2018-02-27 DIAGNOSIS — I120 Hypertensive chronic kidney disease with stage 5 chronic kidney disease or end stage renal disease: Secondary | ICD-10-CM | POA: Diagnosis present

## 2018-02-27 DIAGNOSIS — I2510 Atherosclerotic heart disease of native coronary artery without angina pectoris: Secondary | ICD-10-CM | POA: Diagnosis present

## 2018-02-27 DIAGNOSIS — K5900 Constipation, unspecified: Secondary | ICD-10-CM | POA: Diagnosis not present

## 2018-02-27 DIAGNOSIS — Z515 Encounter for palliative care: Secondary | ICD-10-CM | POA: Diagnosis not present

## 2018-02-27 DIAGNOSIS — G459 Transient cerebral ischemic attack, unspecified: Secondary | ICD-10-CM | POA: Diagnosis not present

## 2018-02-27 DIAGNOSIS — E440 Moderate protein-calorie malnutrition: Secondary | ICD-10-CM

## 2018-02-27 DIAGNOSIS — T148XXA Other injury of unspecified body region, initial encounter: Secondary | ICD-10-CM | POA: Diagnosis not present

## 2018-02-27 DIAGNOSIS — L97529 Non-pressure chronic ulcer of other part of left foot with unspecified severity: Secondary | ICD-10-CM | POA: Diagnosis not present

## 2018-02-27 DIAGNOSIS — M86172 Other acute osteomyelitis, left ankle and foot: Secondary | ICD-10-CM | POA: Diagnosis present

## 2018-02-27 DIAGNOSIS — L89312 Pressure ulcer of right buttock, stage 2: Secondary | ICD-10-CM | POA: Diagnosis present

## 2018-02-27 DIAGNOSIS — M109 Gout, unspecified: Secondary | ICD-10-CM | POA: Diagnosis present

## 2018-02-27 DIAGNOSIS — D638 Anemia in other chronic diseases classified elsewhere: Secondary | ICD-10-CM | POA: Diagnosis not present

## 2018-02-27 DIAGNOSIS — Z90710 Acquired absence of both cervix and uterus: Secondary | ICD-10-CM

## 2018-02-27 DIAGNOSIS — Z87891 Personal history of nicotine dependence: Secondary | ICD-10-CM

## 2018-02-27 DIAGNOSIS — Z8619 Personal history of other infectious and parasitic diseases: Secondary | ICD-10-CM | POA: Diagnosis not present

## 2018-02-27 DIAGNOSIS — L89102 Pressure ulcer of unspecified part of back, stage 2: Secondary | ICD-10-CM | POA: Diagnosis present

## 2018-02-27 DIAGNOSIS — K8020 Calculus of gallbladder without cholecystitis without obstruction: Secondary | ICD-10-CM | POA: Diagnosis not present

## 2018-02-27 DIAGNOSIS — E1151 Type 2 diabetes mellitus with diabetic peripheral angiopathy without gangrene: Secondary | ICD-10-CM | POA: Diagnosis not present

## 2018-02-27 DIAGNOSIS — K219 Gastro-esophageal reflux disease without esophagitis: Secondary | ICD-10-CM | POA: Diagnosis present

## 2018-02-27 DIAGNOSIS — L89322 Pressure ulcer of left buttock, stage 2: Secondary | ICD-10-CM | POA: Diagnosis present

## 2018-02-27 DIAGNOSIS — Z7401 Bed confinement status: Secondary | ICD-10-CM | POA: Diagnosis not present

## 2018-02-27 DIAGNOSIS — E8889 Other specified metabolic disorders: Secondary | ICD-10-CM | POA: Diagnosis present

## 2018-02-27 DIAGNOSIS — Z7189 Other specified counseling: Secondary | ICD-10-CM | POA: Diagnosis not present

## 2018-02-27 DIAGNOSIS — Z89512 Acquired absence of left leg below knee: Secondary | ICD-10-CM | POA: Diagnosis not present

## 2018-02-27 DIAGNOSIS — I96 Gangrene, not elsewhere classified: Secondary | ICD-10-CM

## 2018-02-27 DIAGNOSIS — Z823 Family history of stroke: Secondary | ICD-10-CM

## 2018-02-27 DIAGNOSIS — L089 Local infection of the skin and subcutaneous tissue, unspecified: Secondary | ICD-10-CM | POA: Diagnosis present

## 2018-02-27 DIAGNOSIS — E1122 Type 2 diabetes mellitus with diabetic chronic kidney disease: Secondary | ICD-10-CM | POA: Diagnosis present

## 2018-02-27 DIAGNOSIS — E1129 Type 2 diabetes mellitus with other diabetic kidney complication: Secondary | ICD-10-CM | POA: Diagnosis not present

## 2018-02-27 DIAGNOSIS — L97429 Non-pressure chronic ulcer of left heel and midfoot with unspecified severity: Secondary | ICD-10-CM | POA: Diagnosis not present

## 2018-02-27 DIAGNOSIS — E1152 Type 2 diabetes mellitus with diabetic peripheral angiopathy with gangrene: Principal | ICD-10-CM | POA: Diagnosis present

## 2018-02-27 DIAGNOSIS — R933 Abnormal findings on diagnostic imaging of other parts of digestive tract: Secondary | ICD-10-CM | POA: Diagnosis not present

## 2018-02-27 DIAGNOSIS — R197 Diarrhea, unspecified: Secondary | ICD-10-CM | POA: Diagnosis not present

## 2018-02-27 DIAGNOSIS — K621 Rectal polyp: Secondary | ICD-10-CM | POA: Diagnosis present

## 2018-02-27 DIAGNOSIS — M2550 Pain in unspecified joint: Secondary | ICD-10-CM | POA: Diagnosis not present

## 2018-02-27 DIAGNOSIS — Z79899 Other long term (current) drug therapy: Secondary | ICD-10-CM

## 2018-02-27 DIAGNOSIS — M79672 Pain in left foot: Secondary | ICD-10-CM | POA: Diagnosis not present

## 2018-02-27 DIAGNOSIS — Z833 Family history of diabetes mellitus: Secondary | ICD-10-CM | POA: Diagnosis not present

## 2018-02-27 DIAGNOSIS — Z992 Dependence on renal dialysis: Secondary | ICD-10-CM

## 2018-02-27 DIAGNOSIS — I69354 Hemiplegia and hemiparesis following cerebral infarction affecting left non-dominant side: Secondary | ICD-10-CM

## 2018-02-27 DIAGNOSIS — I4891 Unspecified atrial fibrillation: Secondary | ICD-10-CM | POA: Diagnosis present

## 2018-02-27 DIAGNOSIS — R21 Rash and other nonspecific skin eruption: Secondary | ICD-10-CM | POA: Diagnosis not present

## 2018-02-27 DIAGNOSIS — R0902 Hypoxemia: Secondary | ICD-10-CM | POA: Diagnosis not present

## 2018-02-27 DIAGNOSIS — E1169 Type 2 diabetes mellitus with other specified complication: Secondary | ICD-10-CM | POA: Diagnosis present

## 2018-02-27 DIAGNOSIS — D72829 Elevated white blood cell count, unspecified: Secondary | ICD-10-CM | POA: Diagnosis not present

## 2018-02-27 DIAGNOSIS — Z885 Allergy status to narcotic agent status: Secondary | ICD-10-CM | POA: Diagnosis not present

## 2018-02-27 HISTORY — DX: Acquired absence of right leg below knee: Z89.511

## 2018-02-27 HISTORY — DX: End stage renal disease: N18.6

## 2018-02-27 HISTORY — DX: Dependence on renal dialysis: Z99.2

## 2018-02-27 LAB — CBC WITH DIFFERENTIAL/PLATELET
Abs Immature Granulocytes: 0.13 10*3/uL — ABNORMAL HIGH (ref 0.00–0.07)
Basophils Absolute: 0.1 10*3/uL (ref 0.0–0.1)
Basophils Relative: 0 %
EOS ABS: 0.7 10*3/uL — AB (ref 0.0–0.5)
Eosinophils Relative: 4 %
HCT: 40 % (ref 36.0–46.0)
Hemoglobin: 10.8 g/dL — ABNORMAL LOW (ref 12.0–15.0)
Immature Granulocytes: 1 %
Lymphocytes Relative: 8 %
Lymphs Abs: 1.4 10*3/uL (ref 0.7–4.0)
MCH: 21.4 pg — AB (ref 26.0–34.0)
MCHC: 27 g/dL — AB (ref 30.0–36.0)
MCV: 79.4 fL — ABNORMAL LOW (ref 80.0–100.0)
MONO ABS: 1.2 10*3/uL — AB (ref 0.1–1.0)
Monocytes Relative: 7 %
Neutro Abs: 14.5 10*3/uL — ABNORMAL HIGH (ref 1.7–7.7)
Neutrophils Relative %: 80 %
PLATELETS: 437 10*3/uL — AB (ref 150–400)
RBC: 5.04 MIL/uL (ref 3.87–5.11)
RDW: 19.9 % — AB (ref 11.5–15.5)
WBC: 17.9 10*3/uL — ABNORMAL HIGH (ref 4.0–10.5)
nRBC: 0 % (ref 0.0–0.2)

## 2018-02-27 LAB — I-STAT CG4 LACTIC ACID, ED
Lactic Acid, Venous: 2.68 mmol/L (ref 0.5–1.9)
Lactic Acid, Venous: 1.84 mmol/L (ref 0.5–1.9)

## 2018-02-27 LAB — C-REACTIVE PROTEIN: CRP: 11.7 mg/dL — ABNORMAL HIGH (ref <1.0)

## 2018-02-27 LAB — COMPREHENSIVE METABOLIC PANEL
ALK PHOS: 114 U/L (ref 38–126)
ALT: 11 U/L (ref 0–44)
AST: 19 U/L (ref 15–41)
Albumin: 3 g/dL — ABNORMAL LOW (ref 3.5–5.0)
Anion gap: 15 (ref 5–15)
BILIRUBIN TOTAL: 0.8 mg/dL (ref 0.3–1.2)
BUN: 35 mg/dL — AB (ref 8–23)
CALCIUM: 9.2 mg/dL (ref 8.9–10.3)
CO2: 27 mmol/L (ref 22–32)
Chloride: 95 mmol/L — ABNORMAL LOW (ref 98–111)
Creatinine, Ser: 6.13 mg/dL — ABNORMAL HIGH (ref 0.44–1.00)
GFR calc Af Amer: 7 mL/min — ABNORMAL LOW (ref >60)
GFR, EST NON AFRICAN AMERICAN: 6 mL/min — AB (ref >60)
Glucose, Bld: 119 mg/dL — ABNORMAL HIGH (ref 70–99)
Potassium: 3.3 mmol/L — ABNORMAL LOW (ref 3.5–5.1)
Sodium: 137 mmol/L (ref 135–145)
TOTAL PROTEIN: 7.9 g/dL (ref 6.5–8.1)

## 2018-02-27 LAB — SEDIMENTATION RATE: SED RATE: 53 mm/h — AB (ref 0–22)

## 2018-02-27 LAB — APTT: aPTT: 41 seconds — ABNORMAL HIGH (ref 24–36)

## 2018-02-27 LAB — PREALBUMIN: PREALBUMIN: 10.9 mg/dL — AB (ref 18–38)

## 2018-02-27 LAB — GLUCOSE, CAPILLARY: GLUCOSE-CAPILLARY: 145 mg/dL — AB (ref 70–99)

## 2018-02-27 LAB — HEPARIN LEVEL (UNFRACTIONATED)

## 2018-02-27 MED ORDER — HEPARIN (PORCINE) IN NACL 100-0.45 UNIT/ML-% IJ SOLN
1100.0000 [IU]/h | INTRAMUSCULAR | Status: DC
  Administered 2018-02-27: 950 [IU]/h via INTRAVENOUS
  Administered 2018-02-28: 1100 [IU]/h via INTRAVENOUS
  Filled 2018-02-27 (×3): qty 250

## 2018-02-27 MED ORDER — GABAPENTIN 100 MG PO CAPS
100.0000 mg | ORAL_CAPSULE | Freq: Every day | ORAL | Status: DC
  Administered 2018-02-27 – 2018-03-09 (×10): 100 mg via ORAL
  Filled 2018-02-27 (×10): qty 1

## 2018-02-27 MED ORDER — ZOLPIDEM TARTRATE 5 MG PO TABS
5.0000 mg | ORAL_TABLET | Freq: Every evening | ORAL | Status: DC | PRN
  Administered 2018-03-09: 5 mg via ORAL
  Filled 2018-02-27: qty 1

## 2018-02-27 MED ORDER — MIDODRINE HCL 5 MG PO TABS
10.0000 mg | ORAL_TABLET | Freq: Two times a day (BID) | ORAL | Status: DC
  Administered 2018-02-27 – 2018-03-10 (×20): 10 mg via ORAL
  Filled 2018-02-27 (×19): qty 2

## 2018-02-27 MED ORDER — ACETAMINOPHEN 325 MG PO TABS: 650.0000 mg | ORAL_TABLET | Freq: Four times a day (QID) | ORAL | Status: DC | PRN

## 2018-02-27 MED ORDER — ONDANSETRON HCL 4 MG/2ML IJ SOLN
4.0000 mg | Freq: Four times a day (QID) | INTRAMUSCULAR | Status: DC | PRN
  Administered 2018-03-04 – 2018-03-05 (×2): 4 mg via INTRAVENOUS
  Filled 2018-02-27 (×2): qty 2

## 2018-02-27 MED ORDER — ACETAMINOPHEN 650 MG RE SUPP: 650.0000 mg | Freq: Four times a day (QID) | RECTAL | Status: DC | PRN

## 2018-02-27 MED ORDER — PIPERACILLIN-TAZOBACTAM IN DEX 2-0.25 GM/50ML IV SOLN
2.2500 g | Freq: Three times a day (TID) | INTRAVENOUS | Status: DC
  Filled 2018-02-27: qty 50

## 2018-02-27 MED ORDER — DOCUSATE SODIUM 283 MG RE ENEM
1.0000 | ENEMA | RECTAL | Status: DC | PRN
  Filled 2018-02-27: qty 1

## 2018-02-27 MED ORDER — LINEZOLID 600 MG/300ML IV SOLN
600.0000 mg | Freq: Two times a day (BID) | INTRAVENOUS | Status: DC
  Administered 2018-02-27 – 2018-03-02 (×6): 600 mg via INTRAVENOUS
  Filled 2018-02-27 (×6): qty 300

## 2018-02-27 MED ORDER — MIDODRINE HCL 10 MG PO TABS: 10.0000 mg | ORAL_TABLET | Freq: Two times a day (BID) | ORAL | 0 refills | Status: AC

## 2018-02-27 MED ORDER — POLYETHYLENE GLYCOL 3350 17 G PO PACK: 17.0000 g | PACK | Freq: Every day | ORAL | Status: DC | PRN

## 2018-02-27 MED ORDER — HYDROXYZINE HCL 25 MG PO TABS
25.0000 mg | ORAL_TABLET | Freq: Three times a day (TID) | ORAL | Status: DC | PRN
  Administered 2018-02-27 – 2018-03-04 (×4): 25 mg via ORAL
  Filled 2018-02-27 (×5): qty 1

## 2018-02-27 MED ORDER — CALCIUM CARBONATE ANTACID 1250 MG/5ML PO SUSP
500.0000 mg | Freq: Four times a day (QID) | ORAL | Status: DC | PRN
  Filled 2018-02-27: qty 5

## 2018-02-27 MED ORDER — INSULIN ASPART 100 UNIT/ML ~~LOC~~ SOLN: 0.0000 [IU] | Freq: Every day | SUBCUTANEOUS | Status: DC

## 2018-02-27 MED ORDER — FLEET ENEMA 7-19 GM/118ML RE ENEM
1.0000 | ENEMA | Freq: Once | RECTAL | Status: DC | PRN
  Filled 2018-02-27: qty 1

## 2018-02-27 MED ORDER — ONDANSETRON HCL 4 MG PO TABS: 4.0000 mg | ORAL_TABLET | Freq: Four times a day (QID) | ORAL | Status: DC | PRN

## 2018-02-27 MED ORDER — DOCUSATE SODIUM 100 MG PO CAPS
100.0000 mg | ORAL_CAPSULE | Freq: Two times a day (BID) | ORAL | Status: DC
  Administered 2018-02-27 – 2018-03-03 (×8): 100 mg via ORAL
  Filled 2018-02-27 (×10): qty 1

## 2018-02-27 MED ORDER — SORBITOL 70 % SOLN
30.0000 mL | Status: DC | PRN
  Filled 2018-02-27: qty 30

## 2018-02-27 MED ORDER — INSULIN ASPART 100 UNIT/ML ~~LOC~~ SOLN
0.0000 [IU] | Freq: Three times a day (TID) | SUBCUTANEOUS | Status: DC
  Administered 2018-02-28: 1 [IU] via SUBCUTANEOUS
  Administered 2018-03-01 (×2): 2 [IU] via SUBCUTANEOUS
  Administered 2018-03-04: 1 [IU] via SUBCUTANEOUS
  Administered 2018-03-05 (×3): 2 [IU] via SUBCUTANEOUS
  Administered 2018-03-06: 1 [IU] via SUBCUTANEOUS

## 2018-02-27 MED ORDER — NEPRO/CARBSTEADY PO LIQD
237.0000 mL | Freq: Three times a day (TID) | ORAL | Status: DC | PRN
  Filled 2018-02-27: qty 237

## 2018-02-27 MED ORDER — CAMPHOR-MENTHOL 0.5-0.5 % EX LOTN
1.0000 "application " | TOPICAL_LOTION | Freq: Three times a day (TID) | CUTANEOUS | Status: DC | PRN
  Filled 2018-02-27 (×2): qty 222

## 2018-02-27 MED ORDER — PIPERACILLIN-TAZOBACTAM 3.375 G IVPB
3.3750 g | Freq: Two times a day (BID) | INTRAVENOUS | Status: DC
  Administered 2018-02-27 – 2018-03-02 (×6): 3.375 g via INTRAVENOUS
  Filled 2018-02-27 (×6): qty 50

## 2018-02-27 NOTE — Progress Notes (Signed)
Subjective: 84 year old female presents the office today for concerns and follow-up evaluation of left heel wound.  She states that the wound is about the same however according to the notes it appears that there is been some increased odor coming from the wound.  She states they have been keeping Betadine on the area. Denies any systemic complaints such as fevers, chills, nausea, vomiting. No acute changes since last appointment, and no other complaints at this time.   She has been under the care of Dr. Lemar Livings as well from Vascular. She had an agio on 01/23/2018. Findings are below: Aorta and iliac segments are free of flow-limiting stenosis however are heavily calcified.  There are multiple approximately 30% stenosis throughout her SFA popliteal segment her only runoff on the left leg is the peroneal artery and this is occluded first segment of approximately 6 cm.  After balloon angioplasty there is no further residual stenosis and no flow-limiting dissection.  The radial artery does give out at the ankle.  I could not cross a posterior tibial that is occluded there is only a takeoff of an anterior tibial.  Objective: AAO x3, NAD Right BKA Left leg there is not sure still however the eschar appears to be significantly progressed since consider when I last saw her.  There is bogginess present along the wound and is able to open this up and there is purulent and significant malodorous discharge coming from the heel.  There is increase in edema to the foot as well as well as the ankle and leg.  There is actually ulceration now present to the hallux as well. No open lesions or pre-ulcerative lesions.  No pain with calf compression, swelling, warmth, erythema  Assessment: Gas gangrene left heel  Plan: -All treatment options discussed with the patient including all alternatives, risks, complications.  -X-rays were obtained reviewed.  Soft tissue emphysema is present there is also cortical changes  suggestive of osteomyelitis of the posterior calcaneus -Unfortunately the wound has significantly progressed since my last saw her.  Last time I saw her the eschar was stable and hard and there is no signs of infection however is made significant deterioration since I last saw her.  Given the significant malodor as well as x-ray findings under the center to the hospital in the ER was contacted. She will need to be seen by vascular surgery. Unfortunately don't think there is much I can do for her at this point.  I did talk to her about possibly amputation but will defer to vascular surgery.  Vivi Barrack DPM

## 2018-02-27 NOTE — Progress Notes (Signed)
ANTICOAGULATION CONSULT NOTE - Initial Consult  Pharmacy Consult for heparin Indication: atrial fibrillation  Allergies  Allergen Reactions  . Codeine Hives  . Vancomycin Rash    Patient Measurements: Height: 5\' 2"  (157.5 cm) Weight: 177 lb (80.3 kg) IBW/kg (Calculated) : 50.1 Heparin Dosing Weight: 67. kg  Vital Signs: Temp: 97.8 F (36.6 C) (10/21 1311) Temp Source: Oral (10/21 1311) BP: 130/51 (10/21 1700) Pulse Rate: 59 (10/21 1645)  Labs: Recent Labs    02/27/18 1336  HGB 10.8*  HCT 40.0  PLT 437*  CREATININE 6.13*    Estimated Creatinine Clearance: 6.8 mL/min (A) (by C-G formula based on SCr of 6.13 mg/dL (H)).   Medical History: Past Medical History:  Diagnosis Date  . Anemia   . C. difficile diarrhea   . Depression   . Diabetes mellitus   . Diabetic neuropathy (HCC)   . ESRD (end stage renal disease) on dialysis (HCC)    TTS  . GERD (gastroesophageal reflux disease)   . Gout   . Hyperparathyroidism   . MRSA (methicillin resistant staph aureus) culture positive   . Obesity   . Osteoarthritis   . Peripheral arterial disease (HCC)    Gangrene-Left Great Toe  . S/P BKA (below knee amputation) unilateral, right (HCC)   . Stroke [facility])     Medications:  Scheduled:  . docusate sodium  100 mg Oral BID  . insulin aspart  0-5 Units Subcutaneous QHS  . [START ON 02/28/2018] insulin aspart  0-9 Units Subcutaneous TID WC   Infusions:  . linezolid (ZYVOX) IV 600 mg (02/27/18 1654)  . piperacillin-tazobactam (ZOSYN)  IV 3.375 g (02/27/18 1658)    Assessment: Pt is 85 YOF presenting with worsening infection and ulceration to left heel. Pt underwent angioplasty in September 2019 for peripheral vascular disease. PMH is significant for CKD on HD MWF, diabetes, diabetic neuropathy, GERD, hyperparathyroidism, PAD, Afib, and HTN.Pharmacy consulted for heparin for Afib. PTA anticoagulation includes Eliquis; last dose was today at 1000. Will initiate heparin  drip, no bolus 12 hours after last dose. Hgb - 10.8, Plt - 437  Goal of Therapy:  Heparin level 0.3-0.7 units/ml  APTT 66-102s Monitor platelets by anticoagulation protocol: Yes   Plan:  Heparin gtt 950 units/hr Baseline HL and aPTT ordered 8-hour HL w/ AM labs Daily HL and aPTT until levels correlate  Ayo Smoak J Aki Abalos 02/27/2018,5:40 PM

## 2018-02-27 NOTE — ED Notes (Signed)
Admitting provider at bedside.

## 2018-02-27 NOTE — Progress Notes (Signed)
Office Visit Note   Patient: [name]           Date of Birth: 1933/01/19           MRN: [id number] Visit Date: 02/27/2018              Requested by: Mila Palmer, MD [address] [place], [place] [address] PCP: Mila Palmer, MD  Chief Complaint  Patient presents with  . Right Leg - Routine Post Op    Right BKA; small ulcer      HPI: Patient is a 84 year old female who is status post a right transtibial amputation on 11/11/2017.  She is seen for postoperative follow-up.  She is healing well and only has a very small scab over the distal incision line.  [facility] has made her prosthesis but she was not wearing it much yet as she still had some small eschar over the distal prosthesis.  She can begin wearing her prosthesis a couple hours daily at this point.  She is seeing Dr. Ardelle Anton for some left foot wounds and is minimizing her weightbearing on the left foot due to the ulcers.  Assessment & Plan: Visit Diagnoses:  1. Acquired absence of leg below knee, right (HCC)   2. Atherosclerosis of native arteries of the extremities with ulceration (HCC)   3. Moderate protein-calorie malnutrition (HCC)     Plan: Patient should begin wearing her right transtibial amputation prosthesis for a couple hours at a time during the daytime.  She should continue to use her stump shrinker stocking.  She does not require any local wound care she has no residual ulcer.  She is okay to start using her prosthesis for standing and walking once her left foot has healed sufficiently.  She will follow-up here in 4 weeks.  Follow-Up Instructions: Return in about 4 weeks (around 03/27/2018).   Ortho Exam  Patient is alert, oriented, no adenopathy, well-dressed, normal affect, normal respiratory effort. The right BKA incision is healed well except for a very small area of crusting and scabbing over the lateral incision line.  There is no signs of cellulitis.  There is no edema of  the stump.  Imaging: No results found. No images are attached to the encounter.  Labs: Lab Results  Component Value Date   HGBA1C 5.5 01/09/2018   HGBA1C 5.7 (H) 11/11/2017   HGBA1C 5.8 (H) 12/15/2013   ESRSEDRATE 17 12/17/2006   ESRSEDRATE 108 (H) 10/17/2006   ESRSEDRATE 110 (H) 10/12/2006   LABURIC 10.7 (H) 10/12/2006   REPTSTATUS 10/14/2017 FINAL 10/11/2017   GRAMSTAIN  10/11/2017    FEW WBC PRESENT, PREDOMINANTLY PMN ABUNDANT GRAM POSITIVE COCCI IN PAIRS ABUNDANT GRAM NEGATIVE RODS MODERATE GRAM POSITIVE RODS Performed at [facility], [address], [place], [place] [address]    CULT  10/11/2017    MODERATE ESCHERICHIA COLI FEW METHICILLIN RESISTANT STAPHYLOCOCCUS AUREUS    LABORGA ESCHERICHIA COLI 10/11/2017   LABORGA METHICILLIN RESISTANT STAPHYLOCOCCUS AUREUS 10/11/2017     Lab Results  Component Value Date   ALBUMIN 2.6 (L) 01/12/2018   ALBUMIN 2.6 (L) 01/10/2018   ALBUMIN 2.8 (L) 01/09/2018   LABURIC 10.7 (H) 10/12/2006    Body mass index is 30.04 kg/m.  Orders:  No orders of the defined types were placed in this encounter.  No orders of the defined types were placed in this encounter.    Procedures: No procedures performed  Clinical Data: No additional findings.  ROS:  All other systems negative, except as noted in the HPI. Review of Systems  Objective: Vital Signs: Ht 5\' 3"  (1.6 m)   Wt 169 lb 9.6 oz (76.9 kg)   BMI 30.04 kg/m   Specialty Comments:  No specialty comments available.  PMFS History: Patient Active Problem List   Diagnosis Date Noted  . Atrial fibrillation (HCC) 01/17/2018  . Gangrene (HCC)   . Pressure injury of skin 01/09/2018  . TIA (transient ischemic attack) 01/08/2018  . Unilateral complete BKA, right, initial encounter (HCC)   . Diabetes mellitus type 2 in obese (HCC)   . History of CVA (cerebrovascular accident)   . Post-operative pain   . Acute blood loss anemia   . Anemia of chronic disease     . Benign essential HTN   . Morbid obesity (HCC)   . Below knee amputation status, right 11/11/2017  . Gangrene of right foot (HCC)   . PAD (peripheral artery disease) (HCC) 04/29/2015  . Atherosclerosis of native arteries of the extremities with ulceration (HCC) 01/10/2014  . CVA (cerebral infarction) 12/14/2013  . Dysphagia, unspecified(787.20) 11/28/2013  . Diabetes mellitus with peripheral vascular disease (HCC) 10/31/2012  . Depression 10/31/2012  . Anemia 10/31/2012  . History of Clostridium difficile colitis 10/31/2012  . Chronic diarrhea 10/31/2012  . ESRD on dialysis [facility]) 10/06/2012   Past Medical History:  Diagnosis Date  . Anemia   . C. difficile diarrhea   . Chronic kidney disease    on Dialysis  . Depression   . Diabetes mellitus   . Diabetic neuropathy (HCC)   . Gangrene (HCC)    right foot  . GERD (gastroesophageal reflux disease)   . Gout   . Hyperparathyroidism   . MRSA (methicillin resistant staph aureus) culture positive   . Obesity   . Osteoarthritis   . Peripheral arterial disease (HCC)    Gangrene-Left Great Toe  . Shortness of breath dyspnea    with exertion  . Stroke [facility])     Family History  Problem Relation Age of Onset  . Cancer Father        type unknown  . Stroke Mother   . Diabetes Sister   . Cancer Brother        type unknown    Past Surgical History:  Procedure Laterality Date  . ABDOMINAL AORTOGRAM N/A 07/05/2016   Procedure: Abdominal Aortogram;  Surgeon: Maeola Harman, MD;  Location: [facility] INVASIVE CV LAB;  Service: Cardiovascular;  Laterality: N/A;  . ABDOMINAL AORTOGRAM N/A 06/24/2017   Procedure: ABDOMINAL AORTOGRAM;  Surgeon: Sherren Kerns, MD;  Location: [facility] INVASIVE CV LAB;  Service: Cardiovascular;  Laterality: N/A;  . ABDOMINAL AORTOGRAM W/LOWER EXTREMITY N/A 01/23/2018   Procedure: ABDOMINAL AORTOGRAM W/LOWER EXTREMITY - Left Side;  Surgeon: Maeola Harman, MD;  Location: [facility] INVASIVE CV LAB;   Service: Cardiovascular;  Laterality: N/A;  . ABDOMINAL HYSTERECTOMY    . AMPUTATION Right 11/11/2017   Procedure: RIGHT BELOW KNEE AMPUTATION;  Surgeon: Nadara Mustard, MD;  Location: [facility] OR;  Service: Orthopedics;  Laterality: Right;  . AV FISTULA PLACEMENT Left 07/07/2015   Procedure: ATTEMPTED INSERTION OFLEFT  ARTERIOVENOUS (AV) GORE-TEX GRAFT THIGH;  Surgeon: Fransisco Hertz, MD;  Location: [facility] OR;  Service: Vascular;  Laterality: Left;  . BELOW KNEE LEG AMPUTATION Right 11/11/2017  . BREAST EXCISIONAL BIOPSY Right 05/25/2006  . COLONOSCOPY    . LOWER EXTREMITY ANGIOGRAPHY Bilateral 07/05/2016   Procedure: Lower Extremity Angiography;  Surgeon: Dennard Schaumann  Randie Heinz, MD;  Location: [facility] INVASIVE CV LAB;  Service: Cardiovascular;  Laterality: Bilateral;  . LOWER EXTREMITY ANGIOGRAPHY Bilateral 06/24/2017   Procedure: Lower Extremity Angiography;  Surgeon: Sherren Kerns, MD;  Location: [facility] INVASIVE CV LAB;  Service: Cardiovascular;  Laterality: Bilateral;  . MULTIPLE TOOTH EXTRACTIONS    . PERIPHERAL VASCULAR BALLOON ANGIOPLASTY Left 01/23/2018   Procedure: PERIPHERAL VASCULAR BALLOON ANGIOPLASTY;  Surgeon: Maeola Harman, MD;  Location: [facility] INVASIVE CV LAB;  Service: Cardiovascular;  Laterality: Left;  peroneal  . PERIPHERAL VASCULAR INTERVENTION Right 06/24/2017   Procedure: PERIPHERAL VASCULAR INTERVENTION;  Surgeon: Sherren Kerns, MD;  Location: [facility] INVASIVE CV LAB;  Service: Cardiovascular;  Laterality: Right;  POPLITEALPTA SFA STENT  . SHUNTOGRAM Right 09/06/12   Thigh graft  . SP DECLOT AVGG Right Sep 14, 2012   Right thigh graft   Social History   Occupational History  . Occupation: retired    Associate Professor: RETIRED  Tobacco Use  . Smoking status: Former Smoker    Types: Cigarettes  . Smokeless tobacco: Never Used  Substance and Sexual Activity  . Alcohol use: No  . Drug use: No  . Sexual activity: Not Currently

## 2018-02-27 NOTE — Progress Notes (Addendum)
Pharmacy Antibiotic Note  [name] is a 84 y.o. female admitted on 02/27/2018 with wound infection. She is ESRD on Tues/Thurs/Sat. She had a prior R BKA. Now starting abx for wound on L heel. Initially vancomycin was selected, due to patient's allergy to vancomycin and history of antibiotics in our system, it was decided to use linezolid.   Plan: Zosyn 3.375 g q12h Zyvox 600 mg IV BID  Height: 5\' 2"  (157.5 cm) Weight: 177 lb (80.3 kg) IBW/kg (Calculated) : 50.1  Temp (24hrs), Avg:97.8 F (36.6 C), Min:97.8 F (36.6 C), Max:97.8 F (36.6 C)  Recent Labs  Lab 02/27/18 1336 02/27/18 1357  WBC 17.9*  --   CREATININE 6.13*  --   LATICACIDVEN  --  2.68*    Antimicrobials this admission: 10/21 zosyn > 1021 zyvox >  Dose adjustments this admission: N/A  Microbiology results: N/A   Baldemar Friday 02/27/2018 3:39 PM

## 2018-02-27 NOTE — ED Provider Notes (Addendum)
[facility] EMERGENCY DEPARTMENT Provider Note   CSN: [id number] Arrival date & time: 02/27/18  1254     History   Chief Complaint Chief Complaint  Patient presents with  . Wound Infection    HPI [name] is a 84 y.o. female.  HPI Patient is an 84 year old female presents the emergency department with worsening infection and ulceration of her left heel.  She is a dialysis patient who dialyzes on Tuesday Thursday Saturday.  She recently underwent angioplasty by Dr. Randie Heinz in September 2019 for some peripheral vascular disease.  She is being managed by the podiatrist regarding her heel but today she was seen in the office and this had significantly worsened and she has changes on her x-ray which are concerning for osteomyelitis.  There is been a new foul smell from the left heel wound and she was sent to the emergency department for further evaluation and treatment for wound infection and osteomyelitis.  She has a prior amputation of her right lower extremity by Dr. Lajoyce Corners.  No fevers or chills.  She has been eating and drinking normally.   Past Medical History:  Diagnosis Date  . Anemia   . C. difficile diarrhea   . Chronic kidney disease    on Dialysis  . Depression   . Diabetes mellitus   . Diabetic neuropathy (HCC)   . Gangrene (HCC)    right foot  . GERD (gastroesophageal reflux disease)   . Gout   . Hyperparathyroidism   . MRSA (methicillin resistant staph aureus) culture positive   . Obesity   . Osteoarthritis   . Peripheral arterial disease (HCC)    Gangrene-Left Great Toe  . Shortness of breath dyspnea    with exertion  . Stroke [facility])     Patient Active Problem List   Diagnosis Date Noted  . Atrial fibrillation (HCC) 01/17/2018  . Gangrene (HCC)   . Pressure injury of skin 01/09/2018  . TIA (transient ischemic attack) 01/08/2018  . Unilateral complete BKA, right, initial encounter (HCC)   . Diabetes mellitus type 2 in obese (HCC)   .  History of CVA (cerebrovascular accident)   . Post-operative pain   . Acute blood loss anemia   . Anemia of chronic disease   . Benign essential HTN   . Morbid obesity (HCC)   . Below knee amputation status, right 11/11/2017  . Gangrene of right foot (HCC)   . PAD (peripheral artery disease) (HCC) 04/29/2015  . Atherosclerosis of native arteries of the extremities with ulceration (HCC) 01/10/2014  . CVA (cerebral infarction) 12/14/2013  . Dysphagia, unspecified(787.20) 11/28/2013  . Diabetes mellitus with peripheral vascular disease (HCC) 10/31/2012  . Depression 10/31/2012  . Anemia 10/31/2012  . History of Clostridium difficile colitis 10/31/2012  . Chronic diarrhea 10/31/2012  . ESRD on dialysis [facility]) 10/06/2012    Past Surgical History:  Procedure Laterality Date  . ABDOMINAL AORTOGRAM N/A 07/05/2016   Procedure: Abdominal Aortogram;  Surgeon: Maeola Harman, MD;  Location: [facility] INVASIVE CV LAB;  Service: Cardiovascular;  Laterality: N/A;  . ABDOMINAL AORTOGRAM N/A 06/24/2017   Procedure: ABDOMINAL AORTOGRAM;  Surgeon: Sherren Kerns, MD;  Location: [facility] INVASIVE CV LAB;  Service: Cardiovascular;  Laterality: N/A;  . ABDOMINAL AORTOGRAM W/LOWER EXTREMITY N/A 01/23/2018   Procedure: ABDOMINAL AORTOGRAM W/LOWER EXTREMITY - Left Side;  Surgeon: Maeola Harman, MD;  Location: [facility] INVASIVE CV LAB;  Service: Cardiovascular;  Laterality: N/A;  . ABDOMINAL HYSTERECTOMY    .  AMPUTATION Right 11/11/2017   Procedure: RIGHT BELOW KNEE AMPUTATION;  Surgeon: Nadara Mustard, MD;  Location: [facility] OR;  Service: Orthopedics;  Laterality: Right;  . AV FISTULA PLACEMENT Left 07/07/2015   Procedure: ATTEMPTED INSERTION OFLEFT  ARTERIOVENOUS (AV) GORE-TEX GRAFT THIGH;  Surgeon: Fransisco Hertz, MD;  Location: [facility] OR;  Service: Vascular;  Laterality: Left;  . BELOW KNEE LEG AMPUTATION Right 11/11/2017  . BREAST EXCISIONAL BIOPSY Right 05/25/2006  . COLONOSCOPY    . LOWER EXTREMITY ANGIOGRAPHY  Bilateral 07/05/2016   Procedure: Lower Extremity Angiography;  Surgeon: Maeola Harman, MD;  Location: [facility] INVASIVE CV LAB;  Service: Cardiovascular;  Laterality: Bilateral;  . LOWER EXTREMITY ANGIOGRAPHY Bilateral 06/24/2017   Procedure: Lower Extremity Angiography;  Surgeon: Sherren Kerns, MD;  Location: [name] INVASIVE CV LAB;  Service: Cardiovascular;  Laterality: Bilateral;  . MULTIPLE TOOTH EXTRACTIONS    . PERIPHERAL VASCULAR BALLOON ANGIOPLASTY Left 01/23/2018   Procedure: PERIPHERAL VASCULAR BALLOON ANGIOPLASTY;  Surgeon: Maeola Harman, MD;  Location: [facility] INVASIVE CV LAB;  Service: Cardiovascular;  Laterality: Left;  peroneal  . PERIPHERAL VASCULAR INTERVENTION Right 06/24/2017   Procedure: PERIPHERAL VASCULAR INTERVENTION;  Surgeon: Sherren Kerns, MD;  Location: [facility] INVASIVE CV LAB;  Service: Cardiovascular;  Laterality: Right;  POPLITEALPTA SFA STENT  . SHUNTOGRAM Right 09/06/12   Thigh graft  . SP DECLOT AVGG Right Sep 14, 2012   Right thigh graft     OB History   None      Home Medications    Prior to Admission medications   Medication Sig Start Date End Date Taking? Authorizing Provider  Amino Acids-Protein Hydrolys (FEEDING SUPPLEMENT, PRO-STAT SUGAR FREE 64,) LIQD Take 30 mLs by mouth daily.     [provider]  amiodarone (PACERONE) 200 MG tablet Take 1 tablet (200 mg total) by mouth 2 (two) times daily. 02/24/18   Medina-Vargas, Monina C, NP  apixaban (ELIQUIS) 2.5 MG TABS tablet Take 1 tablet (2.5 mg total) by mouth 2 (two) times daily. 02/24/18   Medina-Vargas, Monina C, NP  cinacalcet (SENSIPAR) 30 MG tablet Take 1 tablet (30 mg total) by mouth every evening. 02/24/18   Medina-Vargas, Monina C, NP  clopidogrel (PLAVIX) 75 MG tablet Take 1 tablet (75 mg total) by mouth daily. 02/24/18   Medina-Vargas, Monina C, NP  Digoxin 62.5 MCG TABS Take 0.0625 mg by mouth daily. 02/24/18   Medina-Vargas, Monina C, NP  gabapentin (NEURONTIN) 100 MG  capsule Take 1 capsule (100 mg total) by mouth at bedtime. 02/24/18   Medina-Vargas, Monina C, NP  metoprolol tartrate (LOPRESSOR) 25 MG tablet Take 0.5 tablets (12.5 mg total) by mouth 2 (two) times daily. 02/24/18   Medina-Vargas, Monina C, NP  midodrine (PROAMATINE) 10 MG tablet Take 1 tablet (10 mg total) by mouth 2 (two) times daily with a meal. 02/27/18   Medina-Vargas, Monina C, NP  Nutritional Supplements (FEEDING SUPPLEMENT, NEPRO CARB STEADY,) LIQD Take 237 mLs by mouth daily.    [provider]  oxyCODONE-acetaminophen (PERCOCET/ROXICET) 5-325 MG tablet Take 1 tablet by mouth every 6 (six) hours as needed. 02/24/18   Medina-Vargas, Monina C, NP  RENVELA 800 MG tablet Take 2-3 tablets (1,600-2,400 mg total) by mouth See admin instructions. Take 2400 mg by mouth 3 times daily with meals and take 1600 mg by mouth with snacks 02/24/18   Medina-Vargas, Monina C, NP    Family History Family History  Problem Relation Age of Onset  . Cancer Father  type unknown  . Stroke Mother   . Diabetes Sister   . Cancer Brother        type unknown    Social History Social History   Tobacco Use  . Smoking status: Former Smoker    Types: Cigarettes  . Smokeless tobacco: Never Used  Substance Use Topics  . Alcohol use: No  . Drug use: No     Allergies   Codeine and Vancomycin   Review of Systems Review of Systems  All other systems reviewed and are negative.    Physical Exam Updated Vital Signs BP (!) 141/65   Pulse (!) 52   Temp 97.8 F (36.6 C) (Oral)   Resp 18   Ht 5\' 2"  (1.575 m)   Wt 80.3 kg   SpO2 97%   BMI 32.37 kg/m   Physical Exam  Constitutional: She is oriented to person, place, and time. She appears well-developed and well-nourished.  HENT:  Head: Normocephalic.  Eyes: EOM are normal.  Neck: Normal range of motion.  Cardiovascular: Normal rate.  Pulmonary/Chest: Effort normal.  Abdominal: She exhibits no distension.  Musculoskeletal:  Normal range of motion.  Left heel with soft and somewhat wet appearing black eschar with foul smell.  Left foot is perfused.  Mild surrounding erythema and warmth.  Neurological: She is alert and oriented to person, place, and time.  Psychiatric: She has a normal mood and affect.  Nursing note and vitals reviewed.    ED Treatments / Results  Labs (all labs ordered are listed, but only abnormal results are displayed) Labs Reviewed  COMPREHENSIVE METABOLIC PANEL - Abnormal; Notable for the following components:      Result Value   Potassium 3.3 (*)    Chloride 95 (*)    Glucose, Bld 119 (*)    BUN 35 (*)    Creatinine, Ser 6.13 (*)    Albumin 3.0 (*)    GFR calc non Af Amer 6 (*)    GFR calc Af Amer 7 (*)    All other components within normal limits  CBC WITH DIFFERENTIAL/PLATELET - Abnormal; Notable for the following components:   WBC 17.9 (*)    Hemoglobin 10.8 (*)    MCV 79.4 (*)    MCH 21.4 (*)    MCHC 27.0 (*)    RDW 19.9 (*)    Platelets 437 (*)    Neutro Abs 14.5 (*)    Monocytes Absolute 1.2 (*)    Eosinophils Absolute 0.7 (*)    Abs Immature Granulocytes 0.13 (*)    All other components within normal limits  I-STAT CG4 LACTIC ACID, ED - Abnormal; Notable for the following components:   Lactic Acid, Venous 2.68 (*)    All other components within normal limits  I-STAT CG4 LACTIC ACID, ED    EKG None  Radiology Dg Foot 2 Views Left  Result Date: 02/27/2018 Please see detailed radiograph report in office note.   Procedures Procedures (including critical care time)  Medications Ordered in ED Medications - No data to display   Initial Impression / Assessment and Plan / ED Course  I have reviewed the triage vital signs and the nursing notes.  Pertinent labs & imaging results that were available during my care of the patient were reviewed by me and considered in my medical decision making (see chart for details).     Imaging with some soft tissue gas  and signs of osteomyelitis from the podiatry office.  IV antibiotics now.  Patient will need surgical consultation as she may benefit from amputation.   I spoke with Dr Randie Heinz, her vascular surgeon who will see her in consultation in the AM.   Final Clinical Impressions(s) / ED Diagnoses   Final diagnoses:  Acute osteomyelitis of left foot [facility])    ED Discharge Orders    None       Azalia Bilis, MD 02/27/18 1542    Azalia Bilis, MD 02/27/18 1650

## 2018-02-27 NOTE — Addendum Note (Signed)
Addended by: Karin Lieu T on: 02/27/2018 09:44 AM   Modules accepted: Orders

## 2018-02-27 NOTE — H&P (Signed)
History and Physical    [name] ZOX:[id number] DOB: 05/14/1932 DOA: 02/27/2018  PCP: Mila Palmer, MD Consultants:  Ardelle Anton - podiatry; [facility] - nephrology; Lajoyce Corners - orthopedics; Randie Heinz - vascular; Senaida Ores - wound care Patient coming from: [facility] - discharged today to go home; lives with her son; [place]: Niece, [phone]  Chief Complaint:  Foot infection  HPI: [name] is a 84 y.o. female with medical history significant of CVA; PAD s/p balloon angioplasty in 2/19 and 9/19 and R BKA in 7/19; obesity (BMI 32); hyperparathyroidism; DM; and ESRD on HD presenting with wound infection.  She was due to be discharged today.  She went to Dr. Ardelle Anton today and he sent her to the Er for ongoing foot infection.  No fevers.  She has been feeling well.  She has had significant constipation.  Also with pressure ulcers.   ED Course:  Randie Heinz did vascular procedures in the past, has been notified of current issue.  Also with h/o amputation per Dr. Lajoyce Corners.  ESRD on  TTS HD.    Review of Systems: As per HPI; otherwise review of systems reviewed and negative.   Ambulatory Status:  Non-ambulatory  Past Medical History:  Diagnosis Date  . Anemia   . C. difficile diarrhea   . Depression   . Diabetes mellitus   . Diabetic neuropathy (HCC)   . ESRD (end stage renal disease) on dialysis (HCC)    TTS  . GERD (gastroesophageal reflux disease)   . Gout   . Hyperparathyroidism   . MRSA (methicillin resistant staph aureus) culture positive   . Obesity   . Osteoarthritis   . Peripheral arterial disease (HCC)    Gangrene-Left Great Toe  . S/P BKA (below knee amputation) unilateral, right (HCC)   . Stroke [facility])     Past Surgical History:  Procedure Laterality Date  . ABDOMINAL AORTOGRAM N/A 07/05/2016   Procedure: Abdominal Aortogram;  Surgeon: Maeola Harman, MD;  Location: [facility] INVASIVE CV LAB;  Service: Cardiovascular;  Laterality: N/A;  . ABDOMINAL AORTOGRAM N/A 06/24/2017     Procedure: ABDOMINAL AORTOGRAM;  Surgeon: Sherren Kerns, MD;  Location: [facility] INVASIVE CV LAB;  Service: Cardiovascular;  Laterality: N/A;  . ABDOMINAL AORTOGRAM W/LOWER EXTREMITY N/A 01/23/2018   Procedure: ABDOMINAL AORTOGRAM W/LOWER EXTREMITY - Left Side;  Surgeon: Maeola Harman, MD;  Location: [facility] INVASIVE CV LAB;  Service: Cardiovascular;  Laterality: N/A;  . ABDOMINAL HYSTERECTOMY    . AMPUTATION Right 11/11/2017   Procedure: RIGHT BELOW KNEE AMPUTATION;  Surgeon: Nadara Mustard, MD;  Location: [facility] OR;  Service: Orthopedics;  Laterality: Right;  . AV FISTULA PLACEMENT Left 07/07/2015   Procedure: ATTEMPTED INSERTION OFLEFT  ARTERIOVENOUS (AV) GORE-TEX GRAFT THIGH;  Surgeon: Fransisco Hertz, MD;  Location: [facility] OR;  Service: Vascular;  Laterality: Left;  . BELOW KNEE LEG AMPUTATION Right 11/11/2017  . BREAST EXCISIONAL BIOPSY Right 05/25/2006  . COLONOSCOPY    . LOWER EXTREMITY ANGIOGRAPHY Bilateral 07/05/2016   Procedure: Lower Extremity Angiography;  Surgeon: Maeola Harman, MD;  Location: [facility] INVASIVE CV LAB;  Service: Cardiovascular;  Laterality: Bilateral;  . LOWER EXTREMITY ANGIOGRAPHY Bilateral 06/24/2017   Procedure: Lower Extremity Angiography;  Surgeon: Sherren Kerns, MD;  Location: [facility] INVASIVE CV LAB;  Service: Cardiovascular;  Laterality: Bilateral;  . MULTIPLE TOOTH EXTRACTIONS    . PERIPHERAL VASCULAR BALLOON ANGIOPLASTY Left 01/23/2018   Procedure: PERIPHERAL VASCULAR BALLOON ANGIOPLASTY;  Surgeon: Maeola Harman, MD;  Location: [facility] INVASIVE CV  LAB;  Service: Cardiovascular;  Laterality: Left;  peroneal  . PERIPHERAL VASCULAR INTERVENTION Right 06/24/2017   Procedure: PERIPHERAL VASCULAR INTERVENTION;  Surgeon: Sherren Kerns, MD;  Location: [facility] INVASIVE CV LAB;  Service: Cardiovascular;  Laterality: Right;  POPLITEALPTA SFA STENT  . SHUNTOGRAM Right 09/06/12   Thigh graft  . SP DECLOT AVGG Right Sep 14, 2012   Right thigh graft    Social History    Socioeconomic History  . Marital status: Divorced    Spouse name: Not on file  . Number of children: 0  . Years of education: college  . Highest education level: Not on file  Occupational History  . Occupation: retired    Associate Professor: RETIRED  Social Needs  . Financial resource strain: Not on file  . Food insecurity:    Worry: Not on file    Inability: Not on file  . Transportation needs:    Medical: Not on file    Non-medical: Not on file  Tobacco Use  . Smoking status: Former Smoker    Types: Cigarettes    Last attempt to quit: 1980    Years since quitting: 39.8  . Smokeless tobacco: Never Used  Substance and Sexual Activity  . Alcohol use: No  . Drug use: No  . Sexual activity: Not Currently  Lifestyle  . Physical activity:    Days per week: Not on file    Minutes per session: Not on file  . Stress: Not on file  Relationships  . Social connections:    Talks on phone: Not on file    Gets together: Not on file    Attends religious service: Not on file    Active member of club or organization: Not on file    Attends meetings of clubs or organizations: Not on file    Relationship status: Not on file  . Intimate partner violence:    Fear of current or ex partner: Not on file    Emotionally abused: Not on file    Physically abused: Not on file    Forced sexual activity: Not on file  Other Topics Concern  . Not on file  Social History Narrative   reports that she has quit smoking. Her smoking use included cigarettes. She has never used smokeless tobacco. She reports that she does not drink alcohol or use drugs.    Allergies  Allergen Reactions  . Codeine Hives  . Vancomycin Rash    Family History  Problem Relation Age of Onset  . Cancer Father        type unknown  . Stroke Mother   . Diabetes Sister   . Cancer Brother        type unknown    Prior to Admission medications   Medication Sig Start Date End Date Taking? Authorizing Provider  Amino  Acids-Protein Hydrolys (FEEDING SUPPLEMENT, PRO-STAT SUGAR FREE 64,) LIQD Take 30 mLs by mouth daily.     [provider]  amiodarone (PACERONE) 200 MG tablet Take 1 tablet (200 mg total) by mouth 2 (two) times daily. 02/24/18   Medina-Vargas, Monina C, NP  apixaban (ELIQUIS) 2.5 MG TABS tablet Take 1 tablet (2.5 mg total) by mouth 2 (two) times daily. 02/24/18   Medina-Vargas, Monina C, NP  cinacalcet (SENSIPAR) 30 MG tablet Take 1 tablet (30 mg total) by mouth every evening. 02/24/18   Medina-Vargas, Monina C, NP  clopidogrel (PLAVIX) 75 MG tablet Take 1 tablet (75 mg total) by mouth daily. 02/24/18  Medina-Vargas, Monina C, NP  Digoxin 62.5 MCG TABS Take 0.0625 mg by mouth daily. 02/24/18   Medina-Vargas, Monina C, NP  gabapentin (NEURONTIN) 100 MG capsule Take 1 capsule (100 mg total) by mouth at bedtime. 02/24/18   Medina-Vargas, Monina C, NP  metoprolol tartrate (LOPRESSOR) 25 MG tablet Take 0.5 tablets (12.5 mg total) by mouth 2 (two) times daily. 02/24/18   Medina-Vargas, Monina C, NP  midodrine (PROAMATINE) 10 MG tablet Take 1 tablet (10 mg total) by mouth 2 (two) times daily with a meal. 02/27/18   Medina-Vargas, Monina C, NP  Nutritional Supplements (FEEDING SUPPLEMENT, NEPRO CARB STEADY,) LIQD Take 237 mLs by mouth daily.    [provider]  oxyCODONE-acetaminophen (PERCOCET/ROXICET) 5-325 MG tablet Take 1 tablet by mouth every 6 (six) hours as needed. 02/24/18   Medina-Vargas, Monina C, NP  RENVELA 800 MG tablet Take 2-3 tablets (1,600-2,400 mg total) by mouth See admin instructions. Take 2400 mg by mouth 3 times daily with meals and take 1600 mg by mouth with snacks 02/24/18   Medina-Vargas, Monina C, NP    Physical Exam: Vitals:   02/27/18 1515 02/27/18 1600 02/27/18 1615 02/27/18 1645  BP: 119/71 (!) 130/45 (!) 123/51 (!) 136/41  Pulse:  60 74 (!) 59  Resp:  15  13  Temp:      TempSrc:      SpO2: 92% 96%  93%  Weight:      Height:         General:   Appears calm and comfortable and is NAD Eyes:  PERRL, EOMI, normal lids, iris ENT: grossly normal hearing, lips & tongue, mmm; artificial dentition Neck:  no LAD, masses or thyromegaly; no carotid bruits Cardiovascular:  RRR, no m/r/g. No LE edema.  Respiratory:   CTA bilaterally with no wheezes/rales/rhonchi.  Normal respiratory effort. Abdomen:  soft, NT, ND, NABS Skin:  Various areas of skin breakdown diffusely     She has a large eschar on her left heel.  It has a very foul smelling, purulent drainage.  Her left great toe also has some purulent drainage with some ?areas along the bones of the toe - possibly from prior amputation on xray and also probably not as relevant given the high likelihood for need for amputation.  Musculoskeletal:s/p R BKA, now with gangrene of LLE Psychiatric:  grossly normal mood and affect, speech fluent and appropriate, AOx3 Neurologic:  CN 2-12 grossly intact, moves all extremities in coordinated fashion    Radiological Exams on Admission: Dg Foot 2 Views Left  Result Date: 02/27/2018 Please see detailed radiograph report in office note.   EKG: not done   Labs on Admission: I have personally reviewed the available labs and imaging studies at the time of the admission.  Pertinent labs:   Glucose 119 BUN 35/Creatinine 6.13/GFR 7 Albumin 3.0 Lactate 2.68 WBC 17.9 Hgb 10.8 - stable Platelets 437  Assessment/Plan Principal Problem:   Gangrene (HCC) Active Problems:   Diabetes mellitus with peripheral vascular disease (HCC)   Atherosclerosis of native arteries of the extremities with ulceration (HCC)   Anemia of chronic disease   Benign essential HTN   ESRD (end stage renal disease) on dialysis (HCC)   Gangrene of L heel -Patient with prior R BKA and balloon angiography of LLE in 9/19 presenting with non-healing ulcer/gangrene of R heel -Also with possible infection of left toe, but this is clearly secondary -This is clearly a deep  infection and is concerning for osteomyelitis -There is  soft tissue emphysema visualized on xray with cortical changes consistent with osteo -There appears to be a significant risk of need for amputation - I have discussed this with the patient -Will admit, Med Surg -Dr. Randie Heinz is aware and will see the patient tomorrow AM -Antibiotics with Zyvox and Zosyn -Lower extremity wound infection order set utilized, including orders for CM, SW, DM coordinator, wound care, and nutrition consult. -Goal would be for glucose <150 to facilitate wound healing. -Patient should be on bed rest, non-weight bearing. -Excellent BP control is needed   PVD -Recent balloon angioplasty on 9/16 of the left peroneal artery -There is likely little further intervention possible, but Dr. Randie Heinz has been consulted -Hold Plavix pending possible surgery  ESRD -Patient on chronic TTS HD -Nephrology prn order set utilized -She does not appear to be volume overloaded or otherwise in need of acute HD -Dr. Lawana Pai notified so that she can be added to the list for HD tomorrow -Continue Sensipar, Renvela  HTN -Continue Lopressor  DM -A1c indicates good control -She does not appear to be on medications for this at this time -Cover with sensitive-scale SSI  Afib -Continue Amiodarone, Digoxin for rate control -Hold Eliquis in anticipation of upcoming surgery -Heparin drip per pharmacy for now  Anemia -Slightly worse than usual -Plan to transfuse <7 or pre-operatively in anticipation of surgery   DVT prophylaxis: Heparin Code Status:  Full - confirmed with patient/family Family Communication: Niece and grandson were present throughout evaluation  Disposition Plan:  SNF once clinically improved Consults called: Vascular surgery; DM coordinator, wound care, SW  Admission status: Admit - It is my clinical opinion that admission to INPATIENT is reasonable and necessary because of the expectation that this patient will  require hospital care that crosses at least 2 midnights to treat this condition based on the medical complexity of the problems presented.  Given the aforementioned information, the predictability of an adverse outcome is felt to be significant.    Jonah Blue MD [facility]  If note is complete, please contact covering daytime or nighttime physician. [email or web address] Password [email or web address]  02/27/2018, 5:03 PM

## 2018-02-27 NOTE — Progress Notes (Signed)
Arrived from E.D,placed in med-surgical bed comfortably.Skin assessed with Vonna Kotyk R.N. -Left heel gangrene with greenish fluid drainage.MASD on both of her buttocks /sacral area and within the areas of MASD,on each buttocks,there are two stage II pressure wound 5 cm x.5cm and on the center of sacral area,there one stage II pressure ulcer .6cm x .6cm.Thick barrier cream applied. Recent Vital Signs   BP (!) 106/33 (BP Location: Right Arm)   Pulse 60   Temp 97.9 F (36.6 C) (Oral)   Resp 16   Ht 5\' 2"  (1.575 m)   Wt 80.3 kg   SpO2 96%   BMI 32.37 kg/m    Past Medical History:  Diagnosis Date  . Anemia   . C. difficile diarrhea   . Depression   . Diabetes mellitus   . Diabetic neuropathy (HCC)   . ESRD (end stage renal disease) on dialysis (HCC)    TTS  . GERD (gastroesophageal reflux disease)   . Gout   . Hyperparathyroidism   . MRSA (methicillin resistant staph aureus) culture positive   . Obesity   . Osteoarthritis   . Peripheral arterial disease (HCC)    Gangrene-Left Great Toe  . S/P BKA (below knee amputation) unilateral, right (HCC)   . Stroke [facility])      Expected Discharge Date     Diet Order            Diet renal/carb modified with fluid restriction Diet-HS Snack? Nothing; Fluid restriction: 1200 mL Fluid; Room service appropriate? Yes; Fluid consistency: Thin  Diet effective now               VTE Documentation      Work Intensity Score/Level of Care     @LEVELOFCARE @   Mobility  Range of Motion: Active, All extremities     Consult Orders  (From admission, onward)         Start     Ordered   02/27/18 1710  Nutrition Consult  Once    Provider:  (Not yet assigned)  Question:  Reason for consult?  Answer:  Wound healing   02/27/18 1715   02/27/18 1710  Consult to wound, ostomy, continence  Once    Provider:  (Not yet assigned)  Question:  Reason for Consult?  Answer:  Lower extremity wound   02/27/18 1715   02/27/18 1710  Consult to Social Work   Once    Provider:  (Not yet assigned)  Question:  Reason for Consult:  Answer:  Nursing home placement   02/27/18 1715           Significant Events    DC Barriers

## 2018-02-27 NOTE — ED Triage Notes (Signed)
Pt has diabetic wound to left heel, poor circulation. Sent here from MD office to have this wound assessed. Green/bloody drainage coming from site. Pt has R BKA. Denies fever/chills.

## 2018-02-27 NOTE — ED Notes (Signed)
Iv team at bedside  

## 2018-02-27 NOTE — Telephone Encounter (Signed)
[name] - ED transferred to a tech, not the ED Triage Nurse. [facility] tech gave ED Triage Nurse [phone] to [facility]. I informed Tobi Bastos - ED Triage Nurse pt would be arriving by private vehicle with left heel gas gangrene to be admitted through the ED to Vascular.

## 2018-02-27 NOTE — ED Notes (Signed)
Pt given [place] sandwich and 8 oz of diet sprite.

## 2018-02-27 NOTE — ED Provider Notes (Signed)
Patient placed in Quick Look pathway, seen and evaluated   Chief Complaint: foot infection  HPI:  [name] is a 84 y.o. female who present to the ED from [facility] for infected left foot. Patient with hx of DM and poor circulation. R BKA.   ROS: skin: wound infection  Physical Exam:  BP 138/87 (BP Location: Right Arm)   Pulse (!) 102   Temp 97.8 F (36.6 C) (Oral)   Resp 18   Ht 5\' 2"  (1.575 m)   Wt 80.3 kg   SpO2 99%   BMI 32.37 kg/m    Gen: No distress  Neuro: Awake and Alert  Skin: left foot swollen, dark in color, wounds with oozing.    Initiation of care has begun. The patient has been counseled on the process, plan, and necessity for staying for the completion/evaluation, and the remainder of the medical screening examination    Janne Napoleon, NP 02/27/18 1328    Tilden Fossa, MD 02/28/18 [phone]

## 2018-02-28 ENCOUNTER — Other Ambulatory Visit: Payer: Self-pay

## 2018-02-28 LAB — RENAL FUNCTION PANEL
Albumin: 2.2 g/dL — ABNORMAL LOW (ref 3.5–5.0)
Anion gap: 11 (ref 5–15)
BUN: 44 mg/dL — ABNORMAL HIGH (ref 8–23)
CO2: 28 mmol/L (ref 22–32)
Calcium: 8.2 mg/dL — ABNORMAL LOW (ref 8.9–10.3)
Chloride: 97 mmol/L — ABNORMAL LOW (ref 98–111)
Creatinine, Ser: 7.26 mg/dL — ABNORMAL HIGH (ref 0.44–1.00)
GFR calc Af Amer: 5 mL/min — ABNORMAL LOW (ref >60)
GFR calc non Af Amer: 5 mL/min — ABNORMAL LOW (ref >60)
Glucose, Bld: 139 mg/dL — ABNORMAL HIGH (ref 70–99)
Phosphorus: 3.5 mg/dL (ref 2.5–4.6)
Potassium: 3.4 mmol/L — ABNORMAL LOW (ref 3.5–5.1)
Sodium: 136 mmol/L (ref 135–145)

## 2018-02-28 LAB — BASIC METABOLIC PANEL
Anion gap: 11 (ref 5–15)
BUN: 41 mg/dL — AB (ref 8–23)
CO2: 26 mmol/L (ref 22–32)
CREATININE: 7.02 mg/dL — AB (ref 0.44–1.00)
Calcium: 8.6 mg/dL — ABNORMAL LOW (ref 8.9–10.3)
Chloride: 98 mmol/L (ref 98–111)
GFR calc Af Amer: 6 mL/min — ABNORMAL LOW (ref >60)
GFR, EST NON AFRICAN AMERICAN: 5 mL/min — AB (ref >60)
GLUCOSE: 123 mg/dL — AB (ref 70–99)
Potassium: 3.4 mmol/L — ABNORMAL LOW (ref 3.5–5.1)
SODIUM: 135 mmol/L (ref 135–145)

## 2018-02-28 LAB — CBC
HCT: 36.7 % (ref 36.0–46.0)
HEMATOCRIT: 37.2 % (ref 36.0–46.0)
Hemoglobin: 10.5 g/dL — ABNORMAL LOW (ref 12.0–15.0)
Hemoglobin: 10.4 g/dL — ABNORMAL LOW (ref 12.0–15.0)
MCH: 21.9 pg — AB (ref 26.0–34.0)
MCH: 22.3 pg — ABNORMAL LOW (ref 26.0–34.0)
MCHC: 28.2 g/dL — AB (ref 30.0–36.0)
MCHC: 28.3 g/dL — ABNORMAL LOW (ref 30.0–36.0)
MCV: 77.5 fL — ABNORMAL LOW (ref 80.0–100.0)
MCV: 78.6 fL — ABNORMAL LOW (ref 80.0–100.0)
Platelets: 458 10*3/uL — ABNORMAL HIGH (ref 150–400)
Platelets: 459 10*3/uL — ABNORMAL HIGH (ref 150–400)
RBC: 4.8 MIL/uL (ref 3.87–5.11)
RBC: 4.67 MIL/uL (ref 3.87–5.11)
RDW: 19.7 % — AB (ref 11.5–15.5)
RDW: 19.5 % — ABNORMAL HIGH (ref 11.5–15.5)
WBC: 18.7 10*3/uL — AB (ref 4.0–10.5)
WBC: 22.3 10*3/uL — ABNORMAL HIGH (ref 4.0–10.5)
nRBC: 0 % (ref 0.0–0.2)
nRBC: 0 % (ref 0.0–0.2)

## 2018-02-28 LAB — MRSA PCR SCREENING: MRSA by PCR: NEGATIVE

## 2018-02-28 LAB — GLUCOSE, CAPILLARY
GLUCOSE-CAPILLARY: 145 mg/dL — AB (ref 70–99)
Glucose-Capillary: 112 mg/dL — ABNORMAL HIGH (ref 70–99)

## 2018-02-28 LAB — APTT: aPTT: 37 seconds — ABNORMAL HIGH (ref 24–36)

## 2018-02-28 LAB — HEPARIN LEVEL (UNFRACTIONATED)

## 2018-02-28 MED ORDER — LIDOCAINE-PRILOCAINE 2.5-2.5 % EX CREA: 1.0000 "application " | TOPICAL_CREAM | CUTANEOUS | Status: DC | PRN

## 2018-02-28 MED ORDER — SODIUM CHLORIDE 0.9 % IV SOLN: 100.0000 mL | INTRAVENOUS | Status: DC | PRN

## 2018-02-28 MED ORDER — DARBEPOETIN ALFA 60 MCG/0.3ML IJ SOSY
60.0000 ug | PREFILLED_SYRINGE | INTRAMUSCULAR | Status: DC
  Administered 2018-02-28 – 2018-03-07 (×2): 60 ug via INTRAVENOUS
  Filled 2018-02-28: qty 0.3

## 2018-02-28 MED ORDER — PENTAFLUOROPROP-TETRAFLUOROETH EX AERO: 1.0000 "application " | INHALATION_SPRAY | CUTANEOUS | Status: DC | PRN

## 2018-02-28 MED ORDER — OXYCODONE-ACETAMINOPHEN 5-325 MG PO TABS
ORAL_TABLET | ORAL | Status: AC
  Administered 2018-02-28: 1 via ORAL
  Filled 2018-02-28: qty 1

## 2018-02-28 MED ORDER — OXYCODONE-ACETAMINOPHEN 5-325 MG PO TABS
1.0000 | ORAL_TABLET | Freq: Four times a day (QID) | ORAL | Status: DC | PRN
  Administered 2018-02-28 (×3): 1 via ORAL
  Filled 2018-02-28 (×2): qty 1

## 2018-02-28 MED ORDER — HEPARIN SODIUM (PORCINE) 1000 UNIT/ML IJ SOLN
INTRAMUSCULAR | Status: AC
  Filled 2018-02-28: qty 1

## 2018-02-28 MED ORDER — NEPRO/CARBSTEADY PO LIQD: 237.0000 mL | Freq: Two times a day (BID) | ORAL | Status: DC

## 2018-02-28 MED ORDER — DIGOXIN 125 MCG PO TABS
0.0625 mg | ORAL_TABLET | Freq: Every day | ORAL | Status: DC
  Administered 2018-02-28 – 2018-03-10 (×11): 0.0625 mg via ORAL
  Filled 2018-02-28 (×11): qty 1

## 2018-02-28 MED ORDER — CHLORHEXIDINE GLUCONATE CLOTH 2 % EX PADS
6.0000 | MEDICATED_PAD | Freq: Every day | CUTANEOUS | Status: DC
  Administered 2018-02-28 – 2018-03-10 (×10): 6 via TOPICAL

## 2018-02-28 MED ORDER — ALTEPLASE 2 MG IJ SOLR: 2.0000 mg | Freq: Once | INTRAMUSCULAR | Status: DC | PRN

## 2018-02-28 MED ORDER — AMIODARONE HCL 200 MG PO TABS: 200.0000 mg | ORAL_TABLET | Freq: Two times a day (BID) | ORAL | Status: DC

## 2018-02-28 MED ORDER — METOPROLOL TARTRATE 12.5 MG HALF TABLET: 12.5000 mg | ORAL_TABLET | Freq: Two times a day (BID) | ORAL | Status: DC

## 2018-02-28 MED ORDER — SENNOSIDES-DOCUSATE SODIUM 8.6-50 MG PO TABS
2.0000 | ORAL_TABLET | Freq: Two times a day (BID) | ORAL | Status: DC
  Administered 2018-03-01 – 2018-03-04 (×6): 2 via ORAL
  Filled 2018-02-28 (×6): qty 2

## 2018-02-28 MED ORDER — NEPRO/CARBSTEADY PO LIQD
237.0000 mL | ORAL | Status: DC
  Administered 2018-03-01 – 2018-03-09 (×4): 237 mL via ORAL
  Filled 2018-02-28 (×11): qty 237

## 2018-02-28 MED ORDER — HEPARIN SODIUM (PORCINE) 1000 UNIT/ML DIALYSIS: 1000.0000 [IU] | INTRAMUSCULAR | Status: DC | PRN

## 2018-02-28 MED ORDER — LIDOCAINE HCL (PF) 1 % IJ SOLN: 5.0000 mL | INTRAMUSCULAR | Status: DC | PRN

## 2018-02-28 MED ORDER — SEVELAMER CARBONATE 800 MG PO TABS
2400.0000 mg | ORAL_TABLET | Freq: Three times a day (TID) | ORAL | Status: DC
  Administered 2018-02-28 – 2018-03-05 (×11): 2400 mg via ORAL
  Filled 2018-02-28 (×12): qty 3

## 2018-02-28 MED ORDER — MIDODRINE HCL 5 MG PO TABS
ORAL_TABLET | ORAL | Status: AC
  Filled 2018-02-28: qty 1

## 2018-02-28 MED ORDER — PRO-STAT SUGAR FREE PO LIQD
30.0000 mL | Freq: Two times a day (BID) | ORAL | Status: DC
  Administered 2018-03-01 – 2018-03-10 (×13): 30 mL via ORAL
  Filled 2018-02-28 (×15): qty 30

## 2018-02-28 MED ORDER — AMIODARONE HCL 200 MG PO TABS
200.0000 mg | ORAL_TABLET | Freq: Two times a day (BID) | ORAL | Status: DC
  Administered 2018-02-28 – 2018-03-10 (×18): 200 mg via ORAL
  Filled 2018-02-28 (×19): qty 1

## 2018-02-28 MED ORDER — DARBEPOETIN ALFA 60 MCG/0.3ML IJ SOSY
PREFILLED_SYRINGE | INTRAMUSCULAR | Status: AC
  Filled 2018-02-28: qty 0.3

## 2018-02-28 MED ORDER — DOXERCALCIFEROL 4 MCG/2ML IV SOLN
INTRAVENOUS | Status: AC
  Filled 2018-02-28: qty 2

## 2018-02-28 MED ORDER — DOXERCALCIFEROL 4 MCG/2ML IV SOLN
1.0000 ug | INTRAVENOUS | Status: DC
  Administered 2018-02-28 – 2018-03-07 (×4): 1 ug via INTRAVENOUS
  Filled 2018-02-28 (×5): qty 2

## 2018-02-28 MED ORDER — BISACODYL 10 MG RE SUPP: 10.0000 mg | RECTAL | Status: DC | PRN

## 2018-02-28 NOTE — Progress Notes (Signed)
Initial Nutrition Assessment  DOCUMENTATION CODES:   Obesity unspecified  INTERVENTION:   - Nepro Shake po daily, each supplement provides 425 kcal and 19 grams protein  - 30 ml Prostat BID, each supplement provides 100 kcals and 15 grams protein.   - Recommend renal MVI daily  - Provided education regarding fluid restriction and importance of protein in wound healing  NUTRITION DIAGNOSIS:   Increased nutrient needs related to wound healing as evidenced by estimated needs.  GOAL:   Patient will meet greater than or equal to 90% of their needs  MONITOR:   PO intake, Supplement acceptance, Labs, Skin, Weight trends, I & O's  REASON FOR ASSESSMENT:   Consult Wound healing  ASSESSMENT:   84 year old female who presented to the ED from MD office with wound to left heel. PMH significant for right BKA, ESRD on HD, GERD, gout, PAD, and diabetes mellitus. Pt admitted with left heel gangrene.  Vascular surgery consult pending. Per MD note today, pt will likely need amputation.  Spoke with pt at bedside. Family members in room at time of interview.  Pt states, "I eat what I want" and shares that she often eats foods "that they tell me I shouldn't" like "salt on my food" and orange juice. Pt states, "I don't eat right." Pt admits to having a fluid restriction at home but does not know what it is. RD explained 1200 ml fluid restriction on current diet order and provided education regarding what foods and beverages are considered fluids.  Pt reports that she eats more meals in the hospital than she typically does when she is home. Pt states that this is because food does not have a good taste and that she will feel nauseous early on in her meal. Pt states that at home, she eats 1 meal daily which may include fried fish and bread made into a sandwich. Pt states that she does not like baked fish.  Pt reports that she ate well at breakfast this morning and ordered a second meal tray with  oatmeal and raisins. Noted 100% meal completion of this tray. Pt also reported eating 100% of eggs and sausage from previous breakfast tray which was not in pt's room at time of visit.  Pt states that she does talk with the RD at her dialysis center but that "it's the same thing over and over." Pt reports receiving and drinking Nepro or similar oral nutrition supplements when she is at dialysis. Pt is agreeable to receiving Nepro during admission. RD provided education regarding importance of adequate protein intake in wound healing. Pt agreeable to trying Pro-stat to increase protein intake during admission.  Pt states that "a while ago" she weighed 177 lbs but that she was weighed on her hospital bed on admission and it was 168 lbs. Pt is unsure what her UBW or EDW is. Per weight history in chart, pt with weight fluctuations between 168-177 lbs over the past 3 months. Suspect these weight fluctuations are related to fluid fluctuations. Noted pt with weight loss of 24 lbs since February 2019. This is a 12.6% weight loss over the past 8 months which is not significant for timeframe.  Pt is awaiting dialysis today and states that she is normally done with dialysis by this time.  Pt states that she does not like milk or coffee. RD put food preferences into [employer] diet software.  Medications reviewed and include: Colace 100 mg BID, sliding scale Novolog, IV antibiotics  Labs reviewed: potassium  3.4 (L), BUN 41 (H), creatinine 7.02 (H) CBG's: 112, 145  NUTRITION - FOCUSED PHYSICAL EXAM:    Most Recent Value  Orbital Region  Mild depletion  Upper Arm Region  No depletion  Thoracic and Lumbar Region  No depletion  Buccal Region  Mild depletion  Temple Region  No depletion  Clavicle Bone Region  Mild depletion  Clavicle and Acromion Bone Region  Mild depletion  Scapular Bone Region  Unable to assess  Dorsal Hand  No depletion  Patellar Region  Mild depletion  Anterior Thigh Region  Moderate  depletion  Posterior Calf Region  Mild depletion  Edema (RD Assessment)  Moderate [LLE]  Hair  Reviewed  Eyes  Reviewed  Mouth  Reviewed  Skin  Reviewed  Nails  Reviewed       Diet Order:   Diet Order            Diet renal/carb modified with fluid restriction Diet-HS Snack? Nothing; Fluid restriction: 1200 mL Fluid; Room service appropriate? Yes; Fluid consistency: Thin  Diet effective now              EDUCATION NEEDS:   Education needs have been addressed  Skin:  Skin Assessment: Skin Integrity Issues: Unstageable: left heel Diabetic Ulcer: left toe Other: pressure injury (no stage noted) to coccyx  Last BM:  10/21  Height:   Ht Readings from Last 1 Encounters:  02/27/18 5\' 2"  (1.575 m)    Weight:   Wt Readings from Last 1 Encounters:  02/27/18 76.5 kg  ABW: 73.4 kg (calculated using current weight, unsure of dry weight)  Ideal Body Weight:  46.75 kg  BMI:  Body mass index is 30.85 kg/m.  Estimated Nutritional Needs:   Kcal:  1400-1600  Protein:  75-90 grams  Fluid:  UOP + 1000 ml    Earma Reading, MS, RD, LDN Inpatient Clinical Dietitian Pager: [phone] Weekend/After Hours: [phone]

## 2018-02-28 NOTE — Procedures (Signed)
I was present at this dialysis session. I have reviewed the session itself and made appropriate changes.   Vital signs in last 24 hours:  Temp:  [97.9 F (36.6 C)-98.2 F (36.8 C)] 98 F (36.7 C) (10/22 0754) Pulse Rate:  [48-74] 48 (10/22 0754) Resp:  [11-18] 18 (10/22 0754) BP: (89-141)/(24-71) 89/24 (10/22 0754) SpO2:  [40 %-100 %] 89 % (10/22 0405) Weight:  [76.5 kg] 76.5 kg (10/21 2004) Weight change:  Filed Weights   02/27/18 1325 02/27/18 2004  Weight: 80.3 kg 76.5 kg    Recent Labs  Lab 02/28/18 0545  NA 135  K 3.4*  CL 98  CO2 26  GLUCOSE 123*  BUN 41*  CREATININE 7.02*  CALCIUM 8.6*    Recent Labs  Lab 02/27/18 1336 02/28/18 0545  WBC 17.9* 18.7*  NEUTROABS 14.5*  --   HGB 10.8* 10.5*  HCT 40.0 37.2  MCV 79.4* 77.5*  PLT 437* 458*    Scheduled Meds: . amiodarone  200 mg Oral BID  . Chlorhexidine Gluconate Cloth  6 each Topical Q0600  . [START ON 03/07/2018] darbepoetin (ARANESP) injection - DIALYSIS  60 mcg Intravenous Q Tue-HD  . digoxin  0.0625 mg Oral Daily  . docusate sodium  100 mg Oral BID  . doxercalciferol  1 mcg Intravenous Q T,Th,Sa-HD  . feeding supplement (NEPRO CARB STEADY)  237 mL Oral Q24H  . feeding supplement (PRO-STAT SUGAR FREE 64)  30 mL Oral BID  . gabapentin  100 mg Oral QHS  . insulin aspart  0-5 Units Subcutaneous QHS  . insulin aspart  0-9 Units Subcutaneous TID WC  . midodrine  10 mg Oral BID WC  . senna-docusate  2 tablet Oral BID  . sevelamer carbonate  2,400 mg Oral TID WC   Continuous Infusions: . sodium chloride    . sodium chloride    . heparin Stopped (02/28/18 1016)  . linezolid (ZYVOX) IV 600 mg (02/28/18 0428)  . piperacillin-tazobactam (ZOSYN)  IV 3.375 g (02/28/18 0401)   PRN Meds:.sodium chloride, sodium chloride, acetaminophen **OR** acetaminophen, alteplase, bisacodyl, calcium carbonate (dosed in mg elemental calcium), camphor-menthol **AND** hydrOXYzine, docusate sodium, heparin, lidocaine (PF),  lidocaine-prilocaine, ondansetron **OR** ondansetron (ZOFRAN) IV, oxyCODONE-acetaminophen, pentafluoroprop-tetrafluoroeth, polyethylene glycol, sodium phosphate, sorbitol, zolpidem   Irena Cords,  MD 02/28/2018, 2:04 PM

## 2018-02-28 NOTE — Consult Note (Addendum)
[facility] Renal Consultation Note    Indication for Consultation:  Management of ESRD/hemodialysis; anemia, hypertension/volume and secondary hyperparathyroidism PCP:  HPI: [name] is a 84 y.o. female with ESRD on hemodialysis T,Th,S at [facility]. PMH of DMT2, HTH, CVA, PAD S/P R BKA, MRSA, Gout, SHPT, AOCD. Last HD 02/25/2018 left 1.1 under OP EDW.    Patient presented to ED 02/27/2018 with C/O infection in R foot. She was sent from podiatry office. WBC on arrival to ED 17.9, afebrile. She has been admitted for gangrene L foot, started on Zosyn and linezolid. She was seen today by Dr. Lance Sell had recent intervention for critical LLE ischemia with L heel ulceration 01/23/2018. Plan is for either L BKA or L AKA tomorrow.   Patient is currently awake, alert, oriented X 3, very HOH. She has no complaints other than pain in L heel.   Past Medical History:  Diagnosis Date  . Anemia   . C. difficile diarrhea   . Depression   . Diabetes mellitus   . Diabetic neuropathy (HCC)   . ESRD (end stage renal disease) on dialysis (HCC)    TTS  . GERD (gastroesophageal reflux disease)   . Gout   . Hyperparathyroidism   . MRSA (methicillin resistant staph aureus) culture positive   . Obesity   . Osteoarthritis   . Peripheral arterial disease (HCC)    Gangrene-Left Great Toe  . S/P BKA (below knee amputation) unilateral, right (HCC)   . Stroke [facility])    Past Surgical History:  Procedure Laterality Date  . ABDOMINAL AORTOGRAM N/A 07/05/2016   Procedure: Abdominal Aortogram;  Surgeon: Maeola Harman, MD;  Location: [facility] INVASIVE CV LAB;  Service: Cardiovascular;  Laterality: N/A;  . ABDOMINAL AORTOGRAM N/A 06/24/2017   Procedure: ABDOMINAL AORTOGRAM;  Surgeon: Sherren Kerns, MD;  Location: [facility] INVASIVE CV LAB;  Service: Cardiovascular;  Laterality: N/A;  . ABDOMINAL AORTOGRAM W/LOWER EXTREMITY N/A 01/23/2018   Procedure: ABDOMINAL AORTOGRAM W/LOWER  EXTREMITY - Left Side;  Surgeon: Maeola Harman, MD;  Location: [facility] INVASIVE CV LAB;  Service: Cardiovascular;  Laterality: N/A;  . ABDOMINAL HYSTERECTOMY    . AMPUTATION Right 11/11/2017   Procedure: RIGHT BELOW KNEE AMPUTATION;  Surgeon: Nadara Mustard, MD;  Location: [facility] OR;  Service: Orthopedics;  Laterality: Right;  . AV FISTULA PLACEMENT Left 07/07/2015   Procedure: ATTEMPTED INSERTION OFLEFT  ARTERIOVENOUS (AV) GORE-TEX GRAFT THIGH;  Surgeon: Fransisco Hertz, MD;  Location: [facility] OR;  Service: Vascular;  Laterality: Left;  . BELOW KNEE LEG AMPUTATION Right 11/11/2017  . BREAST EXCISIONAL BIOPSY Right 05/25/2006  . COLONOSCOPY    . LOWER EXTREMITY ANGIOGRAPHY Bilateral 07/05/2016   Procedure: Lower Extremity Angiography;  Surgeon: Maeola Harman, MD;  Location: [facility] INVASIVE CV LAB;  Service: Cardiovascular;  Laterality: Bilateral;  . LOWER EXTREMITY ANGIOGRAPHY Bilateral 06/24/2017   Procedure: Lower Extremity Angiography;  Surgeon: Sherren Kerns, MD;  Location: [facility] INVASIVE CV LAB;  Service: Cardiovascular;  Laterality: Bilateral;  . MULTIPLE TOOTH EXTRACTIONS    . PERIPHERAL VASCULAR BALLOON ANGIOPLASTY Left 01/23/2018   Procedure: PERIPHERAL VASCULAR BALLOON ANGIOPLASTY;  Surgeon: Maeola Harman, MD;  Location: [facility] INVASIVE CV LAB;  Service: Cardiovascular;  Laterality: Left;  peroneal  . PERIPHERAL VASCULAR INTERVENTION Right 06/24/2017   Procedure: PERIPHERAL VASCULAR INTERVENTION;  Surgeon: Sherren Kerns, MD;  Location: [facility] INVASIVE CV LAB;  Service: Cardiovascular;  Laterality: Right;  POPLITEALPTA SFA STENT  . SHUNTOGRAM Right 09/06/12  Thigh graft  . SP DECLOT AVGG Right Sep 14, 2012   Right thigh graft   Family History  Problem Relation Age of Onset  . Cancer Father        type unknown  . Stroke Mother   . Diabetes Sister   . Cancer Brother        type unknown   Social History:  reports that she quit smoking about 39 years ago. Her smoking use  included cigarettes. She has never used smokeless tobacco. She reports that she does not drink alcohol or use drugs. Allergies  Allergen Reactions  . Codeine Hives  . Vancomycin Rash   Prior to Admission medications   Medication Sig Start Date End Date Taking? Authorizing Provider  acetaminophen (TYLENOL) 325 MG tablet Take 650 mg by mouth every 6 (six) hours as needed for fever.   Yes [provider]  Amino Acids-Protein Hydrolys (FEEDING SUPPLEMENT, PRO-STAT SUGAR FREE 64,) LIQD Take 30 mLs by mouth daily.    Yes [provider]  amiodarone (PACERONE) 200 MG tablet Take 1 tablet (200 mg total) by mouth 2 (two) times daily. 02/24/18  Yes Medina-Vargas, Monina C, NP  apixaban (ELIQUIS) 2.5 MG TABS tablet Take 1 tablet (2.5 mg total) by mouth 2 (two) times daily. 02/24/18  Yes Medina-Vargas, Monina C, NP  bisacodyl (DULCOLAX) 10 MG suppository Place 10 mg rectally as needed for moderate constipation.   Yes [provider]  cinacalcet (SENSIPAR) 30 MG tablet Take 1 tablet (30 mg total) by mouth every evening. 02/24/18  Yes Medina-Vargas, Monina C, NP  clopidogrel (PLAVIX) 75 MG tablet Take 1 tablet (75 mg total) by mouth daily. 02/24/18  Yes Medina-Vargas, Monina C, NP  digoxin (LANOXIN) 0.125 MG tablet Take 0.0625 mg by mouth daily.   Yes [provider]  gabapentin (NEURONTIN) 100 MG capsule Take 1 capsule (100 mg total) by mouth at bedtime. 02/24/18  Yes Medina-Vargas, Monina C, NP  metoprolol tartrate (LOPRESSOR) 25 MG tablet Take 0.5 tablets (12.5 mg total) by mouth 2 (two) times daily. 02/24/18  Yes Medina-Vargas, Monina C, NP  midodrine (PROAMATINE) 10 MG tablet Take 1 tablet (10 mg total) by mouth 2 (two) times daily with a meal. 02/27/18  Yes Medina-Vargas, Monina C, NP  Nutritional Supplements (FEEDING SUPPLEMENT, NEPRO CARB STEADY,) LIQD Take 237 mLs by mouth daily.   Yes [provider]  oxyCODONE-acetaminophen (PERCOCET/ROXICET) 5-325 MG  tablet Take 1 tablet by mouth every 6 (six) hours as needed. Patient taking differently: Take 1 tablet by mouth every 6 (six) hours as needed for moderate pain.  02/24/18  Yes Medina-Vargas, Monina C, NP  RENVELA 800 MG tablet Take 2-3 tablets (1,600-2,400 mg total) by mouth See admin instructions. Take 2400 mg by mouth 3 times daily with meals and take 1600 mg by mouth with snacks 02/24/18  Yes Medina-Vargas, Monina C, NP  sennosides-docusate sodium (SENOKOT-S) 8.6-50 MG tablet Take 2 tablets by mouth 2 (two) times daily.   Yes [provider]   Current Facility-Administered Medications  Medication Dose Route Frequency Provider Last Rate Last Dose  . acetaminophen (TYLENOL) tablet 650 mg  650 mg Oral Q6H PRN Jonah Blue, MD       Or  . acetaminophen (TYLENOL) suppository 650 mg  650 mg Rectal Q6H PRN Jonah Blue, MD      . amiodarone (PACERONE) tablet 200 mg  200 mg Oral BID Osvaldo Shipper, MD      . bisacodyl (DULCOLAX) suppository 10 mg  10 mg Rectal PRN Jonah Blue, MD      . calcium carbonate (dosed in mg elemental calcium) suspension 500 mg of elemental calcium  500 mg of elemental calcium Oral Q6H PRN Jonah Blue, MD      . camphor-menthol [facility]) lotion 1 application  1 application Topical Q8H PRN Jonah Blue, MD       And  . hydrOXYzine (ATARAX/VISTARIL) tablet 25 mg  25 mg Oral Q8H PRN Jonah Blue, MD   25 mg at 02/28/18 4098  . Chlorhexidine Gluconate Cloth 2 % PADS 6 each  6 each Topical Q0600 Pola Corn, NP   6 each at 02/28/18 [phone]  . digoxin (LANOXIN) tablet 0.0625 mg  0.0625 mg Oral Daily Jonah Blue, MD      . docusate sodium (COLACE) capsule 100 mg  100 mg Oral BID Jonah Blue, MD   100 mg at 02/28/18 0959  . docusate sodium (ENEMEEZ) enema 283 mg  1 enema Rectal PRN Jonah Blue, MD      . doxercalciferol (HECTOROL) injection 1 mcg  1 mcg Intravenous Q T,Th,Sa-HD Pola Corn, NP      . feeding supplement (NEPRO  CARB STEADY) liquid 237 mL  237 mL Oral Q24H Osvaldo Shipper, MD      . feeding supplement (PRO-STAT SUGAR FREE 64) liquid 30 mL  30 mL Oral BID Osvaldo Shipper, MD      . gabapentin (NEURONTIN) capsule 100 mg  100 mg Oral QHS Schorr, Roma Kayser, NP   100 mg at 02/27/18 2129  . heparin ADULT infusion 100 units/mL (25000 units/244mL sodium chloride 0.45%)  1,100 Units/hr Intravenous Continuous Juliette Mangle, RPH   Stopped at 02/28/18 1016  . insulin aspart (novoLOG) injection 0-5 Units  0-5 Units Subcutaneous QHS Jonah Blue, MD      . insulin aspart (novoLOG) injection 0-9 Units  0-9 Units Subcutaneous TID WC Jonah Blue, MD      . linezolid (ZYVOX) IVPB 600 mg  600 mg Intravenous Steva Colder, MD 300 mL/hr at 02/28/18 0428 600 mg at 02/28/18 0428  . midodrine (PROAMATINE) tablet 10 mg  10 mg Oral BID WC Schorr, Roma Kayser, NP   10 mg at 02/28/18 0959  . ondansetron (ZOFRAN) tablet 4 mg  4 mg Oral Q6H PRN Jonah Blue, MD       Or  . ondansetron [facility]) injection 4 mg  4 mg Intravenous Q6H PRN Jonah Blue, MD      . oxyCODONE-acetaminophen (PERCOCET/ROXICET) 5-325 MG per tablet 1 tablet  1 tablet Oral Q6H PRN Schorr, Roma Kayser, NP   1 tablet at 02/28/18 [phone]  . piperacillin-tazobactam (ZOSYN) IVPB 3.375 g  3.375 g Intravenous Steva Colder, MD 12.5 mL/hr at 02/28/18 0401 3.375 g at 02/28/18 0401  . polyethylene glycol (MIRALAX / GLYCOLAX) packet 17 g  17 g Oral Daily PRN Jonah Blue, MD      . senna-docusate (Senokot-S) tablet 2 tablet  2 tablet Oral BID Jonah Blue, MD      . sevelamer carbonate (RENVELA) tablet 2,400 mg  2,400 mg Oral TID WC Jonah Blue, MD      . sodium phosphate (FLEET) 7-19 GM/118ML enema 1 enema  1 enema Rectal Once PRN Jonah Blue, MD      . sorbitol 70 % solution 30 mL  30 mL Oral PRN Jonah Blue, MD      . zolpidem Remus Loffler) tablet 5 mg  5 mg Oral QHS PRN Jonah Blue, MD  Labs: Basic Metabolic  Panel: Recent Labs  Lab 02/27/18 1336 02/28/18 0545  NA 137 135  K 3.3* 3.4*  CL 95* 98  CO2 27 26  GLUCOSE 119* 123*  BUN 35* 41*  CREATININE 6.13* 7.02*  CALCIUM 9.2 8.6*   Liver Function Tests: Recent Labs  Lab 02/27/18 1336  AST 19  ALT 11  ALKPHOS 114  BILITOT 0.8  PROT 7.9  ALBUMIN 3.0*   No results for input(s): LIPASE, AMYLASE in the last 168 hours. No results for input(s): AMMONIA in the last 168 hours. CBC: Recent Labs  Lab 02/27/18 1336 02/28/18 0545  WBC 17.9* 18.7*  NEUTROABS 14.5*  --   HGB 10.8* 10.5*  HCT 40.0 37.2  MCV 79.4* 77.5*  PLT 437* 458*   Cardiac Enzymes: No results for input(s): CKTOTAL, CKMB, CKMBINDEX, TROPONINI in the last 168 hours. CBG: Recent Labs  Lab 02/27/18 2134 02/28/18 0752  GLUCAP 145* 112*   Iron Studies: No results for input(s): IRON, TIBC, TRANSFERRIN, FERRITIN in the last 72 hours. Studies/Results: Dg Foot 2 Views Left  Result Date: 02/27/2018 Please see detailed radiograph report in office note.   ROS: As per HPI otherwise negative.   Physical Exam: Vitals:   02/27/18 2004 02/28/18 0018 02/28/18 0405 02/28/18 0754  BP: (!) 98/46  (!) 114/54 (!) 89/24  Pulse: 60 60 (!) 54 (!) 48  Resp: 14  16 18   Temp: 98 F (36.7 C)  98.2 F (36.8 C) 98 F (36.7 C)  TempSrc: Oral  Oral Oral  SpO2: (!) 40% 100% (!) 89%   Weight: 76.5 kg     Height:         General: Frail appearing elderly female in NAD Head: Normocephalic, atraumatic, sclera non-icteric, mucus membranes are moist Neck: Supple. JVD not elevated. Lungs: Clear bilaterally to auscultation without wheezes, rales, or rhonchi. Breathing is unlabored. Heart: RRR with S1 S2. No murmurs, rubs, or gallops appreciated. Abdomen: Soft, non-tender, non-distended with normoactive bowel sounds. No rebound/guarding. No obvious abdominal masses. M-S:  Strength and tone appear normal for age. Lower extremities: R BKA-stocking in place. L heel with unstageable  wound L heel. Wound L great toe with foul odor.  Neuro: Alert and oriented X 3. Moves all extremities spontaneously. Psych:  Responds to questions appropriately with a normal affect. Dialysis Access: RIJ TDC Drsg intact (permanent TDC pt).   Dialysis Orders: [facility] T,Th,S 4 hrs 180NRe 400/autoflow 2.0 77 kg 2.0 K/2.25 Ca linear sodium, UFP 2 RIJ TDC -Heparin 5000 units IV TIW -Hectorol 1 mcg IV TIW -Mircera 100 mcg IV q 2 weeks (last dose 02/16/2018-last HGB 10.4 02/23/2018)   Assessment/Plan: 1.  Gangrene L Heel-WBC 18.7 On Zosyn and linezolid per primary. Seen today by Dr. Randie Heinz. Plan for either AKA or BKA tomorrow.  2.  ESRD -  T,Th,S. HD today on schedule. K+ 3.4 on 4.0 K bath.  3.  Hypertension/volume  -Has been leaving 1.1-1.4 kg under OP EDW. Has been in SNF, probably lost weight. Attempt 2-2.5 liters with HD today. Lower EDW. May be tricky as current BP is on soft side.  4.  Anemia  - HGB 10.5. ESA due this week. Give Aranesp 60 mcg IV HD today. Follow HGB.  5.  Metabolic bone disease -  Ca 8.6. Continue binders, sensipar, VDRA.  6.  Nutrition - Albumin 3.0 Renal/Carb mod diet, nepro. Renal vits 7.  DM-per primary 8.  H/O Afib on Eliquis. Eliquis currently on hold. Heparin gtt per  primary.   Rita H. Manson Passey, NP-C 02/28/2018, 1:22 PM  [employer] [phone]   I have seen and examined this patient and agree with plan and assessment in the above note with renal recommendations/intervention highlighted.  Pt feels well and is without new complaints.  Jomarie Longs A [place],MD 02/28/2018 2:03 PM

## 2018-02-28 NOTE — Progress Notes (Signed)
   02/28/18 1740  Hand-Off documentation  Report given to (Full Name) [facility] RN  Report received from (Full Name) Wallene Huh RN  Vital Signs  Temp 98.4 F (36.9 C)  Temp Source Oral  Pulse Rate 72  Resp 16  BP (!) 122/40  Dialysis Weight  Weight 75.2 kg  Type of Weight Post-Dialysis  During Hemodialysis Assessment  Intra-Hemodialysis Comments Tx completed  Post-Hemodialysis Assessment  Rinseback Volume (mL) 250 mL  KECN 279 V  Dialyzer Clearance Heavily streaked  Duration of HD Treatment -hour(s) 4 hour(s)  Hemodialysis Intake (mL) 500 mL  UF Total -Machine (mL) 1500 mL  Net UF (mL) 1000 mL  Tolerated HD Treatment Yes  Post-Hemodialysis Comments report called to Su Hoff RN  Hemodialysis Catheter Right Internal jugular  Placement Date/Time: 01/10/18 0720   Orientation: Right  Access Location: Internal jugular  Site Condition No complications  Blue Lumen Status Saline locked;Heparin locked;Flushed  Red Lumen Status Flushed;Saline locked;Heparin locked  Catheter fill solution Heparin 1000 units/ml  Catheter fill volume (Arterial) 1.6 cc  Catheter fill volume (Venous) 1.6  Dressing Type Biopatch  Dressing Status Clean;Dry;Intact  Interventions Dressing reinforced

## 2018-02-28 NOTE — Consult Note (Signed)
Hospital Consult    Reason for Consult:  Gangrenous left heal Referring Physician:  Dr. Rito Ehrlich MRN #:  [id number]  History of Present Illness: This is a 84 y.o. female with history of right bka. She recently underwent peroneal revascularization.  She now is readmitted with progressive gangrenous changes of her left heel.  She acknowledges that she will likely need amputation.  Past Medical History:  Diagnosis Date  . Anemia   . C. difficile diarrhea   . Depression   . Diabetes mellitus   . Diabetic neuropathy (HCC)   . ESRD (end stage renal disease) on dialysis (HCC)    TTS  . GERD (gastroesophageal reflux disease)   . Gout   . Hyperparathyroidism   . MRSA (methicillin resistant staph aureus) culture positive   . Obesity   . Osteoarthritis   . Peripheral arterial disease (HCC)    Gangrene-Left Great Toe  . S/P BKA (below knee amputation) unilateral, right (HCC)   . Stroke [facility])     Past Surgical History:  Procedure Laterality Date  . ABDOMINAL AORTOGRAM N/A 07/05/2016   Procedure: Abdominal Aortogram;  Surgeon: Maeola Harman, MD;  Location: [facility] INVASIVE CV LAB;  Service: Cardiovascular;  Laterality: N/A;  . ABDOMINAL AORTOGRAM N/A 06/24/2017   Procedure: ABDOMINAL AORTOGRAM;  Surgeon: Sherren Kerns, MD;  Location: [facility] INVASIVE CV LAB;  Service: Cardiovascular;  Laterality: N/A;  . ABDOMINAL AORTOGRAM W/LOWER EXTREMITY N/A 01/23/2018   Procedure: ABDOMINAL AORTOGRAM W/LOWER EXTREMITY - Left Side;  Surgeon: Maeola Harman, MD;  Location: [facility] INVASIVE CV LAB;  Service: Cardiovascular;  Laterality: N/A;  . ABDOMINAL HYSTERECTOMY    . AMPUTATION Right 11/11/2017   Procedure: RIGHT BELOW KNEE AMPUTATION;  Surgeon: Nadara Mustard, MD;  Location: [facility] OR;  Service: Orthopedics;  Laterality: Right;  . AV FISTULA PLACEMENT Left 07/07/2015   Procedure: ATTEMPTED INSERTION OFLEFT  ARTERIOVENOUS (AV) GORE-TEX GRAFT THIGH;  Surgeon: Fransisco Hertz, MD;  Location: [facility] OR;   Service: Vascular;  Laterality: Left;  . BELOW KNEE LEG AMPUTATION Right 11/11/2017  . BREAST EXCISIONAL BIOPSY Right 05/25/2006  . COLONOSCOPY    . LOWER EXTREMITY ANGIOGRAPHY Bilateral 07/05/2016   Procedure: Lower Extremity Angiography;  Surgeon: Maeola Harman, MD;  Location: [facility] INVASIVE CV LAB;  Service: Cardiovascular;  Laterality: Bilateral;  . LOWER EXTREMITY ANGIOGRAPHY Bilateral 06/24/2017   Procedure: Lower Extremity Angiography;  Surgeon: Sherren Kerns, MD;  Location: [facility] INVASIVE CV LAB;  Service: Cardiovascular;  Laterality: Bilateral;  . MULTIPLE TOOTH EXTRACTIONS    . PERIPHERAL VASCULAR BALLOON ANGIOPLASTY Left 01/23/2018   Procedure: PERIPHERAL VASCULAR BALLOON ANGIOPLASTY;  Surgeon: Maeola Harman, MD;  Location: [facility] INVASIVE CV LAB;  Service: Cardiovascular;  Laterality: Left;  peroneal  . PERIPHERAL VASCULAR INTERVENTION Right 06/24/2017   Procedure: PERIPHERAL VASCULAR INTERVENTION;  Surgeon: Sherren Kerns, MD;  Location: [facility] INVASIVE CV LAB;  Service: Cardiovascular;  Laterality: Right;  POPLITEALPTA SFA STENT  . SHUNTOGRAM Right 09/06/12   Thigh graft  . SP DECLOT AVGG Right Sep 14, 2012   Right thigh graft    Allergies  Allergen Reactions  . Codeine Hives  . Vancomycin Rash    Prior to Admission medications   Medication Sig Start Date End Date Taking? Authorizing Provider  acetaminophen (TYLENOL) 325 MG tablet Take 650 mg by mouth every 6 (six) hours as needed for fever.   Yes [provider]  Amino Acids-Protein Hydrolys (FEEDING SUPPLEMENT, PRO-STAT SUGAR FREE 64,) LIQD Take 30 mLs by  mouth daily.    Yes [provider]  amiodarone (PACERONE) 200 MG tablet Take 1 tablet (200 mg total) by mouth 2 (two) times daily. 02/24/18  Yes Medina-Vargas, Monina C, NP  apixaban (ELIQUIS) 2.5 MG TABS tablet Take 1 tablet (2.5 mg total) by mouth 2 (two) times daily. 02/24/18  Yes Medina-Vargas, Monina C, NP  bisacodyl (DULCOLAX) 10 MG  suppository Place 10 mg rectally as needed for moderate constipation.   Yes [provider]  cinacalcet (SENSIPAR) 30 MG tablet Take 1 tablet (30 mg total) by mouth every evening. 02/24/18  Yes Medina-Vargas, Monina C, NP  clopidogrel (PLAVIX) 75 MG tablet Take 1 tablet (75 mg total) by mouth daily. 02/24/18  Yes Medina-Vargas, Monina C, NP  digoxin (LANOXIN) 0.125 MG tablet Take 0.0625 mg by mouth daily.   Yes [provider]  gabapentin (NEURONTIN) 100 MG capsule Take 1 capsule (100 mg total) by mouth at bedtime. 02/24/18  Yes Medina-Vargas, Monina C, NP  metoprolol tartrate (LOPRESSOR) 25 MG tablet Take 0.5 tablets (12.5 mg total) by mouth 2 (two) times daily. 02/24/18  Yes Medina-Vargas, Monina C, NP  midodrine (PROAMATINE) 10 MG tablet Take 1 tablet (10 mg total) by mouth 2 (two) times daily with a meal. 02/27/18  Yes Medina-Vargas, Monina C, NP  Nutritional Supplements (FEEDING SUPPLEMENT, NEPRO CARB STEADY,) LIQD Take 237 mLs by mouth daily.   Yes [provider]  oxyCODONE-acetaminophen (PERCOCET/ROXICET) 5-325 MG tablet Take 1 tablet by mouth every 6 (six) hours as needed. Patient taking differently: Take 1 tablet by mouth every 6 (six) hours as needed for moderate pain.  02/24/18  Yes Medina-Vargas, Monina C, NP  RENVELA 800 MG tablet Take 2-3 tablets (1,600-2,400 mg total) by mouth See admin instructions. Take 2400 mg by mouth 3 times daily with meals and take 1600 mg by mouth with snacks 02/24/18  Yes Medina-Vargas, Monina C, NP  sennosides-docusate sodium (SENOKOT-S) 8.6-50 MG tablet Take 2 tablets by mouth 2 (two) times daily.   Yes [provider]    Social History   Socioeconomic History  . Marital status: Divorced    Spouse name: Not on file  . Number of children: 0  . Years of education: college  . Highest education level: Not on file  Occupational History  . Occupation: retired    Associate Professor: RETIRED  Social Needs  . Financial resource  strain: Not on file  . Food insecurity:    Worry: Not on file    Inability: Not on file  . Transportation needs:    Medical: Not on file    Non-medical: Not on file  Tobacco Use  . Smoking status: Former Smoker    Types: Cigarettes    Last attempt to quit: 1980    Years since quitting: 39.8  . Smokeless tobacco: Never Used  Substance and Sexual Activity  . Alcohol use: No  . Drug use: No  . Sexual activity: Not Currently  Lifestyle  . Physical activity:    Days per week: Not on file    Minutes per session: Not on file  . Stress: Not on file  Relationships  . Social connections:    Talks on phone: Not on file    Gets together: Not on file    Attends religious service: Not on file    Active member of club or organization: Not on file    Attends meetings of clubs or organizations: Not on file    Relationship status: Not on file  .  Intimate partner violence:    Fear of current or ex partner: Not on file    Emotionally abused: Not on file    Physically abused: Not on file    Forced sexual activity: Not on file  Other Topics Concern  . Not on file  Social History Narrative   reports that she has quit smoking. Her smoking use included cigarettes. She has never used smokeless tobacco. She reports that she does not drink alcohol or use drugs.     Family History  Problem Relation Age of Onset  . Cancer Father        type unknown  . Stroke Mother   . Diabetes Sister   . Cancer Brother        type unknown    ROS: [x]  Positive   [ ]  Negative   [ ]  All sytems reviewed and are negative  Cardiovascular: []  chest pain/pressure []  palpitations []  SOB lying flat []  DOE []  pain in legs while walking []  pain in legs at rest []  pain in legs at night []  non-healing ulcers []  hx of DVT []  swelling in legs  Pulmonary: []  productive cough []  asthma/wheezing []  home O2  Neurologic: []  weakness in []  arms []  legs []  numbness in []  arms []  legs []  hx of CVA []  mini  stroke [] difficulty speaking or slurred speech []  temporary loss of vision in one eye []  dizziness  Hematologic: []  hx of cancer []  bleeding problems []  problems with blood clotting easily  Endocrine:   []  diabetes []  thyroid disease  GI []  vomiting blood []  blood in stool  GU: [x]  CKD/renal failure []  HD--[]  M/W/F or []  T/T/S []  burning with urination []  blood in urine  Psychiatric: []  anxiety []  depression  Musculoskeletal: []  arthritis []  joint pain  Integumentary: []  rashes []  ulcers  Constitutional: []  fever []  chills   Physical Examination  Vitals:   02/28/18 1545 02/28/18 1600  BP: (!) 103/33 (!) 97/54  Pulse: 78 73  Resp: 16 16  Temp:    SpO2:     Body mass index is 30.93 kg/m.  General:  nad HENT: WNL, normocephalic Pulmonary: normal non-labored breathing Cardiac: Palpable popliteal pulse on the left Abdomen: soft, NT/ND, no masses Extremities: Well-healed right BKA Left gangrenous changes that have extended to approximately 10 cm diameter and very soft and central portion Neurologic: A&O X 3; Appropriate Affect   CBC    Component Value Date/Time   WBC 22.3 (H) 02/28/2018 1440   RBC 4.67 02/28/2018 1440   HGB 10.4 (L) 02/28/2018 1440   HGB 12.8 06/03/2014 1249   HCT 36.7 02/28/2018 1440   HCT 43.0 06/03/2014 1249   PLT 459 (H) 02/28/2018 1440   PLT 350 06/03/2014 1249   MCV 78.6 (L) 02/28/2018 1440   MCV 76.6 (L) 06/03/2014 1249   MCH 22.3 (L) 02/28/2018 1440   MCHC 28.3 (L) 02/28/2018 1440   RDW 19.5 (H) 02/28/2018 1440   RDW 19.8 (H) 06/03/2014 1249   LYMPHSABS 1.4 02/27/2018 1336   LYMPHSABS 1.6 06/03/2014 1249   MONOABS 1.2 (H) 02/27/2018 1336   MONOABS 0.7 06/03/2014 1249   EOSABS 0.7 (H) 02/27/2018 1336   EOSABS 0.1 06/03/2014 1249   BASOSABS 0.1 02/27/2018 1336   BASOSABS 0.1 06/03/2014 1249    BMET    Component Value Date/Time   NA 136 02/28/2018 1409   NA 142 06/03/2014 1249   K 3.4 (L) 02/28/2018 1409   K  5.3 (H) 06/03/2014  1249   CL 97 (L) 02/28/2018 1409   CO2 28 02/28/2018 1409   CO2 25 06/03/2014 1249   GLUCOSE 139 (H) 02/28/2018 1409   GLUCOSE 107 06/03/2014 1249   BUN 44 (H) 02/28/2018 1409   BUN 75.0 (H) 06/03/2014 1249   CREATININE 7.26 (H) 02/28/2018 1409   CREATININE 12.5 Repeated and Verified (HH) 06/03/2014 1249   CALCIUM 8.2 (L) 02/28/2018 1409   CALCIUM 9.5 06/03/2014 1249   GFRNONAA 5 (L) 02/28/2018 1409   GFRAA 5 (L) 02/28/2018 1409    COAGS: Lab Results  Component Value Date   INR 1.09 01/08/2018   INR 1.02 07/08/2015   INR 0.95 12/14/2013      ASSESSMENT/PLAN: This is a 84 y.o. female with previous right below-knee amputation now with progressive left gangrenous heel changes.  We will plan for left below-knee or above-knee amputation tomorrow.  I discussed this with the patient as well as her niece all are in agreement.  She will be n.p.o. past midnight.  Fronie Holstein C. Randie Heinz, MD [facility] Office: [phone] Pager: [phone]

## 2018-02-28 NOTE — Progress Notes (Signed)
[facility] PROGRESS NOTE  [name] ZOX:[id number] DOB: 02-17-1933 DOA: 02/27/2018  PCP: Mila Palmer, MD  Brief History/Interval Summary: 84 y.o. female with medical history significant of CVA; PAD s/p balloon angioplasty in 2/19 and 9/19 and R BKA in 7/19; obesity (BMI 32); hyperparathyroidism; DM; and ESRD on HD presenting with wound infection in the left foot heel.    Patient presented from skilled nursing facility and was supposed to go to her son's place on the day of admission.  Reason for Visit: Left foot infection  Consultants: Orthopedics  Procedures: None yet  Antibiotics: Linezolid and Zosyn.  Subjective/Interval History: Patient complains of pain in her left heel.  Denies any shortness of breath.  No nausea vomiting.  ROS: No headaches  Objective:  Vital Signs  Vitals:   02/27/18 2004 02/28/18 0018 02/28/18 0405 02/28/18 0754  BP: (!) 98/46  (!) 114/54 (!) 89/24  Pulse: 60 60 (!) 54 (!) 48  Resp: 14  16 18   Temp: 98 F (36.7 C)  98.2 F (36.8 C) 98 F (36.7 C)  TempSrc: Oral  Oral Oral  SpO2: (!) 40% 100% (!) 89%   Weight: 76.5 kg     Height:        Intake/Output Summary (Last 24 hours) at 02/28/2018 1208 Last data filed at 02/28/2018 1106 Gross per 24 hour  Intake 843.48 ml  Output 1 ml  Net 842.48 ml   Filed Weights   02/27/18 1325 02/27/18 2004  Weight: 80.3 kg 76.5 kg    General appearance: alert, cooperative, appears stated age and no distress Head: Normocephalic, without obvious abnormality, atraumatic Resp: clear to auscultation bilaterally Cardio: regular rate and rhythm, S1, S2 normal, no murmur, click, rub or gallop GI: soft, non-tender; bowel sounds normal; no masses,  no organomegaly Extremities: Gangrenous changes involving the left heel Neurologic: No focal deficits  Lab Results:  Data Reviewed: I have personally reviewed following labs and imaging studies  CBC: Recent Labs  Lab 02/27/18 1336  02/28/18 0545  WBC 17.9* 18.7*  NEUTROABS 14.5*  --   HGB 10.8* 10.5*  HCT 40.0 37.2  MCV 79.4* 77.5*  PLT 437* 458*    Basic Metabolic Panel: Recent Labs  Lab 02/27/18 1336 02/28/18 0545  NA 137 135  K 3.3* 3.4*  CL 95* 98  CO2 27 26  GLUCOSE 119* 123*  BUN 35* 41*  CREATININE 6.13* 7.02*  CALCIUM 9.2 8.6*    GFR: Estimated Creatinine Clearance: 5.8 mL/min (A) (by C-G formula based on SCr of 7.02 mg/dL (H)).  Liver Function Tests: Recent Labs  Lab 02/27/18 1336  AST 19  ALT 11  ALKPHOS 114  BILITOT 0.8  PROT 7.9  ALBUMIN 3.0*    CBG: Recent Labs  Lab 02/27/18 2134 02/28/18 0752  GLUCAP 145* 112*     Recent Results (from the past 240 hour(s))  Blood Cultures x 2 sites     Status: None (Preliminary result)   Collection Time: 02/27/18  5:15 PM  Result Value Ref Range Status   Specimen Description BLOOD RIGHT ANTECUBITAL  Final   Special Requests   Final    BOTTLES DRAWN AEROBIC AND ANAEROBIC Blood Culture adequate volume   Culture   Final    NO GROWTH < 12 HOURS Performed at [facility], [address], [place], [place] [address]    Report Status PENDING  Incomplete  Blood Cultures x 2 sites     Status: None (Preliminary result)  Collection Time: 02/27/18  7:10 PM  Result Value Ref Range Status   Specimen Description BLOOD RIGHT FOREARM  Final   Special Requests   Final    BOTTLES DRAWN AEROBIC ONLY Blood Culture adequate volume   Culture   Final    NO GROWTH < 12 HOURS Performed at [facility], [address] [place], [place] [address]    Report Status PENDING  Incomplete  MRSA PCR Screening     Status: None   Collection Time: 02/27/18 11:04 PM  Result Value Ref Range Status   MRSA by PCR NEGATIVE NEGATIVE Final    Comment:        The GeneXpert MRSA Assay (FDA approved for NASAL specimens only), is one component of a comprehensive MRSA colonization surveillance program. It is not intended to diagnose MRSA infection nor  to guide or monitor treatment for MRSA infections. Performed at [facility], [address] [place], [place] [address]       Radiology Studies: Dg Foot 2 Views Left  Result Date: 02/27/2018 Please see detailed radiograph report in office note.    Medications:  Scheduled: . Chlorhexidine Gluconate Cloth  6 each Topical Q0600  . docusate sodium  100 mg Oral BID  . doxercalciferol  1 mcg Intravenous Q T,Th,Sa-HD  . feeding supplement (NEPRO CARB STEADY)  237 mL Oral Q24H  . feeding supplement (PRO-STAT SUGAR FREE 64)  30 mL Oral BID  . gabapentin  100 mg Oral QHS  . insulin aspart  0-5 Units Subcutaneous QHS  . insulin aspart  0-9 Units Subcutaneous TID WC  . midodrine  10 mg Oral BID WC   Continuous: . heparin Stopped (02/28/18 1016)  . linezolid (ZYVOX) IV 600 mg (02/28/18 0428)  . piperacillin-tazobactam (ZOSYN)  IV 3.375 g (02/28/18 0401)   WGN:[id number] **OR** acetaminophen, calcium carbonate (dosed in mg elemental calcium), camphor-menthol **AND** hydrOXYzine, docusate sodium, ondansetron **OR** ondansetron (ZOFRAN) IV, oxyCODONE-acetaminophen, polyethylene glycol, sodium phosphate, sorbitol, zolpidem  Assessment/Plan:    Gangrene of the left heel Patient with prior history of right BKA.  Has had angiography and angioplasty of the left peroneal artery on September 16 of 2019.  Presenting with a gangrenous changes involving the heel area.  Toes also appear to be infected.  Vascular surgery was consulted.  Await their input.  She is currently being treated with Zosyn and Zyvox.  Will likely need amputation.  History of peripheral artery disease Recent balloon angioplasty of the left peroneal artery on September 16.  Vascular surgery has been consulted.  Plavix has been held.  End-stage renal disease on hemodialysis, Tuesday Thursday Saturday Nephrology is aware of patient's hospitalization.  Essential hypertension Monitor blood pressures closely.  Noted  to be somewhat hypertensive this morning.  Not on metoprolol currently.  Midodrine.  Diabetes mellitus type 2 with renal complications HbA1c 5.5 in September.  SSI.  Monitor CBGs.  History of atrial fibrillation Patient noted to be on amiodarone and digoxin at home.  These have been resumed.  Eliquis for anticoagulation which has been held for possible surgery.  Patient is on heparin infusion.  Holding metoprolol.  Anemia likely of chronic kidney disease Monitor closely.  No evidence for overt bleeding.  DVT Prophylaxis: Heparin infusion    Code Status: Full code Family Communication: Discussed with the patient Disposition Plan: Management as outlined above.    LOS: 1 day   Osvaldo Shipper  [facility] Pager [phone] 02/28/2018, 12:08 PM  If 7PM-7AM, please  contact night-coverage at [email or web address], password [facility]

## 2018-02-28 NOTE — Anesthesia Preprocedure Evaluation (Addendum)
Anesthesia Evaluation  Patient identified by MRN, date of birth, ID band Patient awake    Reviewed: Allergy & Precautions, NPO status , Patient's Chart, lab work & pertinent test results  History of Anesthesia Complications Negative for: history of anesthetic complications  Airway Mallampati: I  TM Distance: >3 FB Neck ROM: Full    Dental  (+) Edentulous Upper, Edentulous Lower   Pulmonary former smoker,    Pulmonary exam normal        Cardiovascular hypertension, + Peripheral Vascular Disease  Normal cardiovascular exam  ECG: NSR, rate 79   Neuro/Psych PSYCHIATRIC DISORDERS Depression CVA (left sided weakness), Residual Symptoms    GI/Hepatic negative GI ROS, Neg liver ROS,   Endo/Other  diabetes  Renal/GU ESRF and DialysisRenal disease (ON HD T, R, Sat)     Musculoskeletal Gout   Abdominal (+) - obese,   Peds  Hematology  (+) anemia ,   Anesthesia Other Findings Gangrene Right Foot  Reproductive/Obstetrics                            Anesthesia Physical  Anesthesia Plan  ASA: III  Anesthesia Plan: General   Post-op Pain Management:    Induction: Intravenous  PONV Risk Score and Plan: 3 and Ondansetron, Dexamethasone, Treatment may vary due to age or medical condition and Diphenhydramine  Airway Management Planned: LMA  Additional Equipment:   Intra-op Plan:   Post-operative Plan: Extubation in OR  Informed Consent: I have reviewed the patients History and Physical, chart, labs and discussed the procedure including the risks, benefits and alternatives for the proposed anesthesia with the patient or authorized representative who has indicated his/her understanding and acceptance.   Dental advisory given  Plan Discussed with: CRNA and Anesthesiologist  Anesthesia Plan Comments:        Anesthesia Quick Evaluation

## 2018-02-28 NOTE — Progress Notes (Signed)
ANTICOAGULATION CONSULT NOTE - Follow Up Consult  Pharmacy Consult for heparin Indication: atrial fibrillation  Labs: Recent Labs    02/27/18 1336 02/27/18 1914 02/28/18 0545  HGB 10.8*  --  10.5*  HCT 40.0  --  37.2  PLT 437*  --  458*  APTT  --  41* 37*  HEPARINUNFRC  --  >2.20*  --   CREATININE 6.13*  --  7.02*    Assessment: 85yo female subtherapeutic on heparin with initial dosing while Eliquis on hold.  Goal of Therapy:  aPTT 66-102 seconds   Plan:  Will increase heparin gtt by 2 units/kg/hr to 1100 units/hr (was recently therapeutic at this rate) and check level in 8 hours.    Vernard Gambles, PharmD, BCPS  02/28/2018,7:14 AM

## 2018-02-28 NOTE — Consult Note (Signed)
WOC Nurse wound consult note Reason for Consult: Left heel Unstageable Pressure Injury.  Last seen by this writer 7 weeks ago on 01/08/18.  Since that visit, right medial thigh medical device related pressure injury (MDRPI) and right stump incision line have healed. Wound type: Pressure Pressure Injury POA: Yes Measurement: 5.5cm x 6.5cm black, dry and firmly adherent eschar. Minor separation at periphery with red tissue beneath and small amount of serosanguinous drainage on old dressing. I am unable to express exudate by pressing on the eschar this morning. No odor, fluctuance, surrounding induration, edema or erythema Wound bed:As described above Drainage (amount, consistency, odor) As described above Periwound:As described above Dressing procedure/placement/frequency: I have provided Nursing with guidance via the Orders for twice daily painting of the stable eschar with a betadine swabstick and allowing it to air dry prior to placing into the Prevalon boot for heel floatation. This conservative POC will keep the eschar dry and ideally infection-free due to the dual properties of betadine (astringent and antimicrobial).   WOC nursing team will not follow, but will remain available to this patient, the nursing and medical teams.  Please re-consult if needed. Thanks, Ladona Mow, MSN, RN, GNP, [place]  Pager# [phone]

## 2018-03-01 ENCOUNTER — Inpatient Hospital Stay (HOSPITAL_COMMUNITY): Payer: Medicare Other | Admitting: Anesthesiology

## 2018-03-01 ENCOUNTER — Encounter (HOSPITAL_COMMUNITY)
Admission: EM | Disposition: A | Discharge status: Home Health | Payer: Self-pay | Source: Home / Self Care | Attending: Internal Medicine

## 2018-03-01 ENCOUNTER — Encounter (HOSPITAL_COMMUNITY): Payer: Self-pay | Admitting: Certified Registered Nurse Anesthetist

## 2018-03-01 DIAGNOSIS — M86172 Other acute osteomyelitis, left ankle and foot: Secondary | ICD-10-CM

## 2018-03-01 DIAGNOSIS — I70262 Atherosclerosis of native arteries of extremities with gangrene, left leg: Secondary | ICD-10-CM

## 2018-03-01 HISTORY — PX: AMPUTATION: SHX166

## 2018-03-01 LAB — CBC
HCT: 35.4 % — ABNORMAL LOW (ref 36.0–46.0)
Hemoglobin: 10 g/dL — ABNORMAL LOW (ref 12.0–15.0)
MCH: 22 pg — ABNORMAL LOW (ref 26.0–34.0)
MCHC: 28.2 g/dL — ABNORMAL LOW (ref 30.0–36.0)
MCV: 77.8 fL — ABNORMAL LOW (ref 80.0–100.0)
Platelets: 364 K/uL (ref 150–400)
RBC: 4.55 MIL/uL (ref 3.87–5.11)
RDW: 19.9 % — ABNORMAL HIGH (ref 11.5–15.5)
WBC: 27.2 K/uL — ABNORMAL HIGH (ref 4.0–10.5)
nRBC: 0 % (ref 0.0–0.2)

## 2018-03-01 LAB — BASIC METABOLIC PANEL WITH GFR
Anion gap: 11 (ref 5–15)
BUN: 15 mg/dL (ref 8–23)
CO2: 27 mmol/L (ref 22–32)
Calcium: 7.9 mg/dL — ABNORMAL LOW (ref 8.9–10.3)
Chloride: 96 mmol/L — ABNORMAL LOW (ref 98–111)
Creatinine, Ser: 4.17 mg/dL — ABNORMAL HIGH (ref 0.44–1.00)
GFR calc Af Amer: 10 mL/min — ABNORMAL LOW
GFR calc non Af Amer: 9 mL/min — ABNORMAL LOW
Glucose, Bld: 130 mg/dL — ABNORMAL HIGH (ref 70–99)
Potassium: 3.4 mmol/L — ABNORMAL LOW (ref 3.5–5.1)
Sodium: 134 mmol/L — ABNORMAL LOW (ref 135–145)

## 2018-03-01 LAB — GLUCOSE, CAPILLARY
GLUCOSE-CAPILLARY: 130 mg/dL — AB (ref 70–99)
GLUCOSE-CAPILLARY: 166 mg/dL — AB (ref 70–99)
Glucose-Capillary: 118 mg/dL — ABNORMAL HIGH (ref 70–99)
Glucose-Capillary: 116 mg/dL — ABNORMAL HIGH (ref 70–99)
Glucose-Capillary: 120 mg/dL — ABNORMAL HIGH (ref 70–99)
Glucose-Capillary: 151 mg/dL — ABNORMAL HIGH (ref 70–99)

## 2018-03-01 LAB — APTT: aPTT: 92 s — ABNORMAL HIGH (ref 24–36)

## 2018-03-01 LAB — HEPARIN LEVEL (UNFRACTIONATED): Heparin Unfractionated: 0.98 [IU]/mL — ABNORMAL HIGH (ref 0.30–0.70)

## 2018-03-01 SURGERY — AMPUTATION, ABOVE KNEE
Anesthesia: General | Site: Leg Lower | Laterality: Left

## 2018-03-01 MED ORDER — CHLORHEXIDINE GLUCONATE CLOTH 2 % EX PADS: 6.0000 | MEDICATED_PAD | Freq: Every day | CUTANEOUS | Status: DC

## 2018-03-01 MED ORDER — 0.9 % SODIUM CHLORIDE (POUR BTL) OPTIME
TOPICAL | Status: DC | PRN
  Administered 2018-03-01: 1000 mL

## 2018-03-01 MED ORDER — SODIUM CHLORIDE 0.9 % IV SOLN
INTRAVENOUS | Status: DC | PRN
  Administered 2018-03-01: 07:00:00 via INTRAVENOUS

## 2018-03-01 MED ORDER — DEXAMETHASONE SODIUM PHOSPHATE 4 MG/ML IJ SOLN
INTRAMUSCULAR | Status: DC | PRN
  Administered 2018-03-01: 5 mg via INTRAVENOUS

## 2018-03-01 MED ORDER — OXYCODONE-ACETAMINOPHEN 5-325 MG PO TABS
1.0000 | ORAL_TABLET | ORAL | Status: DC | PRN
  Administered 2018-03-01 – 2018-03-02 (×4): 1 via ORAL
  Administered 2018-03-02 – 2018-03-03 (×2): 2 via ORAL
  Administered 2018-03-03 – 2018-03-06 (×3): 1 via ORAL
  Filled 2018-03-01 (×2): qty 2
  Filled 2018-03-01 (×5): qty 1

## 2018-03-01 MED ORDER — MORPHINE SULFATE (PF) 2 MG/ML IV SOLN: 2.0000 mg | INTRAVENOUS | Status: AC | PRN

## 2018-03-01 MED ORDER — PROPOFOL 10 MG/ML IV BOLUS
INTRAVENOUS | Status: AC
  Filled 2018-03-01: qty 40

## 2018-03-01 MED ORDER — SODIUM CHLORIDE 0.9 % IV SOLN
INTRAVENOUS | Status: DC | PRN
  Administered 2018-03-01: 25 ug/min via INTRAVENOUS

## 2018-03-01 MED ORDER — PHENYLEPHRINE 40 MCG/ML (10ML) SYRINGE FOR IV PUSH (FOR BLOOD PRESSURE SUPPORT)
PREFILLED_SYRINGE | INTRAVENOUS | Status: DC | PRN
  Administered 2018-03-01 (×2): 80 ug via INTRAVENOUS

## 2018-03-01 MED ORDER — RENA-VITE PO TABS
1.0000 | ORAL_TABLET | Freq: Every day | ORAL | Status: DC
  Administered 2018-03-01 – 2018-03-09 (×8): 1 via ORAL
  Filled 2018-03-01 (×8): qty 1

## 2018-03-01 MED ORDER — ALBUMIN HUMAN 5 % IV SOLN
INTRAVENOUS | Status: DC | PRN
  Administered 2018-03-01: 08:00:00 via INTRAVENOUS

## 2018-03-01 MED ORDER — PROMETHAZINE HCL 25 MG/ML IJ SOLN: 6.2500 mg | INTRAMUSCULAR | Status: DC | PRN

## 2018-03-01 MED ORDER — FENTANYL CITRATE (PF) 100 MCG/2ML IJ SOLN
INTRAMUSCULAR | Status: DC | PRN
  Administered 2018-03-01 (×4): 25 ug via INTRAVENOUS

## 2018-03-01 MED ORDER — ONDANSETRON HCL 4 MG/2ML IJ SOLN
INTRAMUSCULAR | Status: DC | PRN
  Administered 2018-03-01: 4 mg via INTRAVENOUS

## 2018-03-01 MED ORDER — CINACALCET HCL 30 MG PO TABS
30.0000 mg | ORAL_TABLET | Freq: Every day | ORAL | Status: DC
  Administered 2018-03-01 – 2018-03-09 (×9): 30 mg via ORAL
  Filled 2018-03-01 (×9): qty 1

## 2018-03-01 MED ORDER — HEPARIN SODIUM (PORCINE) 1000 UNIT/ML IJ SOLN
1.6000 mL | Freq: Once | INTRAMUSCULAR | Status: AC
  Administered 2018-03-01: 1600 [IU] via INTRAVENOUS

## 2018-03-01 MED ORDER — FENTANYL CITRATE (PF) 250 MCG/5ML IJ SOLN
INTRAMUSCULAR | Status: AC
  Filled 2018-03-01: qty 5

## 2018-03-01 MED ORDER — FENTANYL CITRATE (PF) 100 MCG/2ML IJ SOLN: 25.0000 ug | INTRAMUSCULAR | Status: DC | PRN

## 2018-03-01 MED ORDER — PROPOFOL 10 MG/ML IV BOLUS
INTRAVENOUS | Status: DC | PRN
  Administered 2018-03-01: 110 mg via INTRAVENOUS

## 2018-03-01 SURGICAL SUPPLY — 57 items
BANDAGE ACE 4X5 VEL STRL LF (GAUZE/BANDAGES/DRESSINGS) ×3 IMPLANT
BANDAGE ACE 6X5 VEL STRL LF (GAUZE/BANDAGES/DRESSINGS) ×3 IMPLANT
BANDAGE ELASTIC 6 VELCRO ST LF (GAUZE/BANDAGES/DRESSINGS) ×2 IMPLANT
BANDAGE ESMARK 6X9 LF (GAUZE/BANDAGES/DRESSINGS) IMPLANT
BLADE SAGITTAL (BLADE)
BLADE SAGITTAL 25.0X1.19X90 (BLADE) IMPLANT
BLADE SAGITTAL 25.0X1.19X90MM (BLADE)
BLADE SAW GIGLI 510 (BLADE) ×2 IMPLANT
BLADE SAW GIGLI 510MM (BLADE) ×1
BLADE SAW THK.89X75X18XSGTL (BLADE) IMPLANT
BNDG CMPR 9X6 STRL LF SNTH (GAUZE/BANDAGES/DRESSINGS) ×1
BNDG COHESIVE 6X5 TAN STRL LF (GAUZE/BANDAGES/DRESSINGS) ×3 IMPLANT
BNDG ESMARK 6X9 LF (GAUZE/BANDAGES/DRESSINGS) ×3
BNDG GAUZE ELAST 4 BULKY (GAUZE/BANDAGES/DRESSINGS) ×3 IMPLANT
CANISTER SUCT 3000ML PPV (MISCELLANEOUS) ×3 IMPLANT
CLIP VESOCCLUDE MED 6/CT (CLIP) ×3 IMPLANT
COVER SURGICAL LIGHT HANDLE (MISCELLANEOUS) ×3 IMPLANT
COVER WAND RF STERILE (DRAPES) ×3 IMPLANT
CUFF TOURNIQUET SINGLE 34IN LL (TOURNIQUET CUFF) ×2 IMPLANT
DRAIN CHANNEL 19F RND (DRAIN) IMPLANT
DRAPE HALF SHEET 40X57 (DRAPES) ×3 IMPLANT
DRAPE ORTHO SPLIT 77X108 STRL (DRAPES) ×6
DRAPE SURG ORHT 6 SPLT 77X108 (DRAPES) ×2 IMPLANT
DRSG ADAPTIC 3X8 NADH LF (GAUZE/BANDAGES/DRESSINGS) ×3 IMPLANT
ELECT CAUTERY BLADE 6.4 (BLADE) ×3 IMPLANT
ELECT REM PT RETURN 9FT ADLT (ELECTROSURGICAL) ×3
ELECTRODE REM PT RTRN 9FT ADLT (ELECTROSURGICAL) ×1 IMPLANT
EVACUATOR SILICONE 100CC (DRAIN) IMPLANT
GAUZE SPONGE 4X4 12PLY STRL (GAUZE/BANDAGES/DRESSINGS) ×3 IMPLANT
GLOVE BIO SURGEON STRL SZ 6.5 (GLOVE) ×1 IMPLANT
GLOVE BIO SURGEON STRL SZ7.5 (GLOVE) ×3 IMPLANT
GLOVE BIO SURGEONS STRL SZ 6.5 (GLOVE) ×1
GLOVE BIOGEL PI IND STRL 6.5 (GLOVE) IMPLANT
GLOVE BIOGEL PI IND STRL 7.0 (GLOVE) IMPLANT
GLOVE BIOGEL PI INDICATOR 6.5 (GLOVE) ×2
GLOVE BIOGEL PI INDICATOR 7.0 (GLOVE) ×4
GOWN STRL REUS W/ TWL LRG LVL3 (GOWN DISPOSABLE) ×2 IMPLANT
GOWN STRL REUS W/ TWL XL LVL3 (GOWN DISPOSABLE) ×1 IMPLANT
GOWN STRL REUS W/TWL LRG LVL3 (GOWN DISPOSABLE) ×6
GOWN STRL REUS W/TWL XL LVL3 (GOWN DISPOSABLE) ×3
KIT BASIN OR (CUSTOM PROCEDURE TRAY) ×3 IMPLANT
KIT TURNOVER KIT B (KITS) ×3 IMPLANT
NS IRRIG 1000ML POUR BTL (IV SOLUTION) ×3 IMPLANT
PACK GENERAL/GYN (CUSTOM PROCEDURE TRAY) ×3 IMPLANT
PAD ARMBOARD 7.5X6 YLW CONV (MISCELLANEOUS) ×6 IMPLANT
STAPLER VISISTAT 35W (STAPLE) ×3 IMPLANT
STOCKINETTE IMPERVIOUS LG (DRAPES) ×3 IMPLANT
SUT ETHILON 3 0 PS 1 (SUTURE) IMPLANT
SUT SILK 0 TIES 10X30 (SUTURE) ×3 IMPLANT
SUT SILK 2 0 (SUTURE) ×3
SUT SILK 2-0 18XBRD TIE 12 (SUTURE) ×1 IMPLANT
SUT SILK 3 0 (SUTURE)
SUT SILK 3-0 18XBRD TIE 12 (SUTURE) IMPLANT
SUT VIC AB 2-0 CT1 18 (SUTURE) ×6 IMPLANT
TOWEL GREEN STERILE (TOWEL DISPOSABLE) ×6 IMPLANT
UNDERPAD 30X30 (UNDERPADS AND DIAPERS) ×3 IMPLANT
WATER STERILE IRR 1000ML POUR (IV SOLUTION) ×3 IMPLANT

## 2018-03-01 NOTE — Progress Notes (Signed)
[facility]                                                                              Patient Demographics  [name], is a 84 y.o. female, DOB - [birth year], NWG:[id number]  Admit date - 02/27/2018   Admitting Physician Jonah Blue, MD  Outpatient Primary MD for the patient is Mila Palmer, MD  Outpatient specialists:   LOS - 2  days   Medical records reviewed and are as summarized below:    Chief Complaint  Patient presents with  . Wound Infection       Brief summary  84 y.o.femalewith medical history significant ofCVA; PAD s/p balloon angioplasty in 2/19 and 9/19 and R BKA in 7/19; obesity (BMI 32); hyperparathyroidism; DM; and ESRD on HD presenting with wound infection in the left foot heel.  Patient presented from skilled nursing facility.  Assessment & Plan    Principal Problem:   Gangrene (HCC) of left foot/heel -Prior history of right BKA, patient had undergone angiography and angioplasty of the left peroneal artery in 01/2018 -Patient presented with gangrenous changes of the left heel area with toe. -Vascular surgery consulted, patient was placed on IV Zyvox and Zosyn -Patient underwent OR today with left BKA,  was seen after surgery, alert and oriented, no complaints   Active Problems:   Diabetes mellitus with peripheral vascular disease (HCC) -CBGs fairly controlled  ESRD on hemodialysis -TTS, nephrology following    Anemia of chronic disease -Follow CBC, hemoglobin currently stable    Benign essential HTN -BP currently stable  History of atrial fibrillation on Eliquis Eliquis currently on hold, patient was placed on heparin drip for the surgery Will restart Eliquis once cleared by vascular surgery, for now continue heparin drip  Code Status: Full CODE STATUS DVT Prophylaxis: Heparin drip Family Communication: Discussed in detail with the patient, all imaging results, lab results explained to the patient and family  member at the bedside   Disposition Plan: When cleared by vascular surgery  Time Spent in minutes 25 minutes  Procedures:  Left BKA 10/23 Hemodialysis  Consultants:   Vascular surgery Nephrology  Antimicrobials:      Medications  Scheduled Meds: . amiodarone  200 mg Oral BID  . Chlorhexidine Gluconate Cloth  6 each Topical Q0600  . cinacalcet  30 mg Oral Q supper  . [START ON 03/07/2018] darbepoetin (ARANESP) injection - DIALYSIS  60 mcg Intravenous Q Tue-HD  . digoxin  0.0625 mg Oral Daily  . docusate sodium  100 mg Oral BID  . doxercalciferol  1 mcg Intravenous Q T,Th,Sa-HD  . feeding supplement (NEPRO CARB STEADY)  237 mL Oral Q24H  . feeding supplement (PRO-STAT SUGAR FREE 64)  30 mL Oral BID  . gabapentin  100 mg Oral QHS  . insulin aspart  0-5 Units Subcutaneous QHS  . insulin aspart  0-9 Units Subcutaneous TID WC  . midodrine  10 mg Oral BID WC  . multivitamin  1 tablet Oral QHS  . senna-docusate  2 tablet Oral BID  . sevelamer carbonate  2,400 mg Oral TID WC   Continuous Infusions: . linezolid (ZYVOX) IV  600 mg (03/01/18 0355)  . piperacillin-tazobactam (ZOSYN)  IV 3.375 g (03/01/18 0457)   PRN Meds:.acetaminophen **OR** acetaminophen, bisacodyl, calcium carbonate (dosed in mg elemental calcium), camphor-menthol **AND** hydrOXYzine, docusate sodium, morphine injection, ondansetron **OR** ondansetron (ZOFRAN) IV, oxyCODONE-acetaminophen, polyethylene glycol, sodium phosphate, sorbitol, zolpidem   Antibiotics   Anti-infectives (From admission, onward)   Start     Dose/Rate Route Frequency Ordered Stop   02/27/18 1600  piperacillin-tazobactam (ZOSYN) IVPB 2.25 g  Status:  Discontinued     2.25 g 100 mL/hr over 30 Minutes Intravenous Every 8 hours 02/27/18 1527 02/27/18 1544   02/27/18 1600  linezolid (ZYVOX) IVPB 600 mg     600 mg 300 mL/hr over 60 Minutes Intravenous Every 12 hours 02/27/18 1537 03/02/18 2359   02/27/18 1600  piperacillin-tazobactam  (ZOSYN) IVPB 3.375 g     3.375 g 12.5 mL/hr over 240 Minutes Intravenous Every 12 hours 02/27/18 1544 03/02/18 2359        Subjective:   [name] was seen and examined today.  Seen after surgery, alert and oriented, denies any specific complaints. Patient denies dizziness, chest pain, shortness of breath, abdominal pain, N/V/D/C. No acute events overnight.    Objective:   Vitals:   03/01/18 0922 03/01/18 0929 03/01/18 0930 03/01/18 0931  BP: 99/67 127/60    Pulse: (!) 54 79 91 78  Resp: 17 17 13 17   Temp:   (!) 97.5 F (36.4 C)   TempSrc:      SpO2: 98% 98% 100% 98%  Weight:      Height:        Intake/Output Summary (Last 24 hours) at 03/01/2018 1632 Last data filed at 03/01/2018 0930 Gross per 24 hour  Intake 1282 ml  Output 1050 ml  Net 232 ml     Wt Readings from Last 3 Encounters:  02/28/18 75.2 kg  02/27/18 76.9 kg  02/24/18 76.9 kg     Exam  General: Alert and oriented x 3, NAD  Eyes:   HEENT:  Atraumatic, normocephalic  Cardiovascular: S1 S2 auscultated,  Regular rate and rhythm.  Respiratory: Clear to auscultation bilaterally, no wheezing, rales or rhonchi  Gastrointestinal: Soft, nontender, nondistended, + bowel sounds  Ext: left BKA, dressing intact on the stump, right BKA  Neuro: no neuro deficits  Musculoskeletal: No digital cyanosis, clubbing  Skin: No rashes  Psych: Normal affect and demeanor, alert and oriented x3    Data Reviewed:  I have personally reviewed following labs and imaging studies  Micro Results Recent Results (from the past 240 hour(s))  Blood Cultures x 2 sites     Status: None (Preliminary result)   Collection Time: 02/27/18  5:15 PM  Result Value Ref Range Status   Specimen Description BLOOD RIGHT ANTECUBITAL  Final   Special Requests   Final    BOTTLES DRAWN AEROBIC AND ANAEROBIC Blood Culture adequate volume   Culture   Final    NO GROWTH 2 DAYS Performed at [facility], [address]  [place], [place] [address]    Report Status PENDING  Incomplete  Blood Cultures x 2 sites     Status: None (Preliminary result)   Collection Time: 02/27/18  7:10 PM  Result Value Ref Range Status   Specimen Description BLOOD RIGHT FOREARM  Final   Special Requests   Final    BOTTLES DRAWN AEROBIC ONLY Blood Culture adequate volume   Culture   Final    NO GROWTH 2 DAYS Performed at  [facility], [address], [place], [place] [address]    Report Status PENDING  Incomplete  MRSA PCR Screening     Status: None   Collection Time: 02/27/18 11:04 PM  Result Value Ref Range Status   MRSA by PCR NEGATIVE NEGATIVE Final    Comment:        The GeneXpert MRSA Assay (FDA approved for NASAL specimens only), is one component of a comprehensive MRSA colonization surveillance program. It is not intended to diagnose MRSA infection nor to guide or monitor treatment for MRSA infections. Performed at [facility], [address], [place], [place] [address]     Radiology Reports Dg Foot 2 Views Left  Result Date: 02/27/2018 Please see detailed radiograph report in office note.   Lab Data:  CBC: Recent Labs  Lab 02/27/18 1336 02/28/18 0545 02/28/18 1440 03/01/18 0516  WBC 17.9* 18.7* 22.3* 27.2*  NEUTROABS 14.5*  --   --   --   HGB 10.8* 10.5* 10.4* 10.0*  HCT 40.0 37.2 36.7 35.4*  MCV 79.4* 77.5* 78.6* 77.8*  PLT 437* 458* 459* 364   Basic Metabolic Panel: Recent Labs  Lab 02/27/18 1336 02/28/18 0545 02/28/18 1409 03/01/18 0516  NA 137 135 136 134*  K 3.3* 3.4* 3.4* 3.4*  CL 95* 98 97* 96*  CO2 27 26 28 27   GLUCOSE 119* 123* 139* 130*  BUN 35* 41* 44* 15  CREATININE 6.13* 7.02* 7.26* 4.17*  CALCIUM 9.2 8.6* 8.2* 7.9*  PHOS  --   --  3.5  --    GFR: Estimated Creatinine Clearance: 9.7 mL/min (A) (by C-G formula based on SCr of 4.17 mg/dL (H)). Liver Function Tests: Recent Labs  Lab 02/27/18 1336 02/28/18 1409  AST 19  --   ALT 11  --   ALKPHOS 114   --   BILITOT 0.8  --   PROT 7.9  --   ALBUMIN 3.0* 2.2*   No results for input(s): LIPASE, AMYLASE in the last 168 hours. No results for input(s): AMMONIA in the last 168 hours. Coagulation Profile: No results for input(s): INR, PROTIME in the last 168 hours. Cardiac Enzymes: No results for input(s): CKTOTAL, CKMB, CKMBINDEX, TROPONINI in the last 168 hours. BNP (last 3 results) No results for input(s): PROBNP in the last 8760 hours. HbA1C: No results for input(s): HGBA1C in the last 72 hours. CBG: Recent Labs  Lab 02/28/18 2109 03/01/18 0542 03/01/18 0838 03/01/18 0946 03/01/18 1117  GLUCAP 118* 130* 116* 120* 151*   Lipid Profile: No results for input(s): CHOL, HDL, LDLCALC, TRIG, CHOLHDL, LDLDIRECT in the last 72 hours. Thyroid Function Tests: No results for input(s): TSH, T4TOTAL, FREET4, T3FREE, THYROIDAB in the last 72 hours. Anemia Panel: No results for input(s): VITAMINB12, FOLATE, FERRITIN, TIBC, IRON, RETICCTPCT in the last 72 hours. Urine analysis:    Component Value Date/Time   COLORURINE YELLOW 12/17/2006 0005   APPEARANCEUR TURBID (A) 12/17/2006 0005   LABSPEC 1.019 12/17/2006 0005   PHURINE 5.0 12/17/2006 0005   GLUCOSEU NEGATIVE 12/17/2006 0005   HGBUR MODERATE (A) 12/17/2006 0005   BILIRUBINUR SMALL (A) 12/17/2006 0005   KETONESUR 15 (A) 12/17/2006 0005   PROTEINUR NEGATIVE 12/17/2006 0005   UROBILINOGEN 0.2 12/17/2006 0005   NITRITE NEGATIVE 12/17/2006 0005   LEUKOCYTESUR LARGE (A) 12/17/2006 0005     Ripudeep Rai M.D. [facility] 03/01/2018, 4:32 PM  Pager: [phone] Between 7am to 7pm - call Pager - [phone]  After 7pm go  to [email or web address] - password [email or web address]  Call night coverage person covering after 7pm

## 2018-03-01 NOTE — Progress Notes (Signed)
  Progress Note    03/01/2018 7:19 AM Day of Surgery  Subjective:  No overnight issues  Vitals:   02/28/18 2022 03/01/18 0536  BP: (!) 132/53 (!) 118/47  Pulse: 61 (!) 58  Resp: 16 12  Temp: 98.3 F (36.8 C) 98.8 F (37.1 C)  SpO2: 91% 95%    Physical Exam: [email or web address] Non labored respirations Left leg with dressing cdi  CBC    Component Value Date/Time   WBC 27.2 (H) 03/01/2018 0516   RBC 4.55 03/01/2018 0516   HGB 10.0 (L) 03/01/2018 0516   HGB 12.8 06/03/2014 1249   HCT 35.4 (L) 03/01/2018 0516   HCT 43.0 06/03/2014 1249   PLT 364 03/01/2018 0516   PLT 350 06/03/2014 1249   MCV 77.8 (L) 03/01/2018 0516   MCV 76.6 (L) 06/03/2014 1249   MCH 22.0 (L) 03/01/2018 0516   MCHC 28.2 (L) 03/01/2018 0516   RDW 19.9 (H) 03/01/2018 0516   RDW 19.8 (H) 06/03/2014 1249   LYMPHSABS 1.4 02/27/2018 1336   LYMPHSABS 1.6 06/03/2014 1249   MONOABS 1.2 (H) 02/27/2018 1336   MONOABS 0.7 06/03/2014 1249   EOSABS 0.7 (H) 02/27/2018 1336   EOSABS 0.1 06/03/2014 1249   BASOSABS 0.1 02/27/2018 1336   BASOSABS 0.1 06/03/2014 1249    BMET    Component Value Date/Time   NA 134 (L) 03/01/2018 0516   NA 142 06/03/2014 1249   K 3.4 (L) 03/01/2018 0516   K 5.3 (H) 06/03/2014 1249   CL 96 (L) 03/01/2018 0516   CO2 27 03/01/2018 0516   CO2 25 06/03/2014 1249   GLUCOSE 130 (H) 03/01/2018 0516   GLUCOSE 107 06/03/2014 1249   BUN 15 03/01/2018 0516   BUN 75.0 (H) 06/03/2014 1249   CREATININE 4.17 (H) 03/01/2018 0516   CREATININE 12.5 Repeated and Verified (HH) 06/03/2014 1249   CALCIUM 7.9 (L) 03/01/2018 0516   CALCIUM 9.5 06/03/2014 1249   GFRNONAA 9 (L) 03/01/2018 0516   GFRAA 10 (L) 03/01/2018 0516    INR    Component Value Date/Time   INR 1.09 01/08/2018 0854     Intake/Output Summary (Last 24 hours) at 03/01/2018 0719 Last data filed at 03/01/2018 0600 Gross per 24 hour  Intake 1022 ml  Output 1000 ml  Net 22 ml     Assessment:  84 y.o. female is s/p with grossly  infected left heel  Plan: OR today for left bka vs aka   [name] C. Randie Heinz, MD [facility] Office: [phone] Pager: [phone]  03/01/2018 7:19 AM

## 2018-03-01 NOTE — Op Note (Signed)
    Patient name: [name] MRN: [id number] DOB: Apr 21, 1933 Sex: female  03/01/2018 Pre-operative Diagnosis: gangrene left heel Post-operative diagnosis:  Same Surgeon:  Apolinar Junes C. Randie Heinz, MD Assistant: Aggie Moats, PA Procedure Performed: Left below knee amputation  Indications: 84 year old female with a history of a right below-knee amputation now has gangrenous heel on the left.  She has undergone attempt at revascularization that has been unsuccessful.  She is now indicated for below versus above-knee amputation on the left.  Findings: There was reasonable bleeding in all muscle was all viable below the knee to expect healing.   Procedure:  The patient was identified in the holding area and taken to the operating room where she is placed supine the operating table and general anesthesia was induced.  She was sterilely prepped and draped in the left lower extremity in the usual fashion and a timeout was called.  Antibiotics were previously administered.  Two thirds one thirds amputation was fashioned on the leg.  An Esmarch was then used to exsanguinate the leg and tourniquet inflated to 250 mmHg.  Total tourniquet time was 7 minutes.  The amputation lines were then traced with a 10 blade.  We carried down to the bone with cautery.  Anterior tibial vessels were clamped and divided.  Bone was divided with an anterior bevel with Gigli saw.  The fibula was clipped 1 cm cephalad to this.  Posterior flap was created with amputation knife.  All vessels were clamped the tourniquet was allowed down.  Nerve was pulled on tension tied off with Vicryl suture and divided.  All of our blood vessels were tied off with silk suture.  Bone was smoothed with rasp.  Wound was heavily irrigated hemostasis obtained.  We then closed the fascia with 2-0 Vicryl suture and the skin with staples.  A sterile dressing was applied.  She was then allowed away from anesthesia having tolerated procedure without immediate  complication.  All counts were correct at completion.  EBL: 50 cc.   [name] C. Randie Heinz, MD [facility] Office: [phone] Pager: [phone]

## 2018-03-01 NOTE — Anesthesia Procedure Notes (Signed)
Procedure Name: LMA Insertion Date/Time: 03/01/2018 7:43 AM Performed by: Jed Limerick, CRNA Pre-anesthesia Checklist: Patient identified, Emergency Drugs available, Suction available and Patient being monitored Patient Re-evaluated:Patient Re-evaluated prior to induction Oxygen Delivery Method: Circle System Utilized Preoxygenation: Pre-oxygenation with 100% oxygen Induction Type: IV induction Ventilation: Mask ventilation without difficulty LMA: LMA inserted LMA Size: 4.0 Number of attempts: 1 Placement Confirmation: positive ETCO2 Tube secured with: Tape Dental Injury: Teeth and Oropharynx as per pre-operative assessment

## 2018-03-01 NOTE — Consult Note (Addendum)
   [facility] CM Inpatient Consult   03/01/2018  [name] 10-04-1932 [id number]    Patient screened for potential [facility] Care Management services due to unplanned readmission risk score of 26% (high) and multiple hospitalizations.  Chart reviewed. Noted patient recently back from OR for left BKA.   Will continue to follow along for progression and disposition plans. Will engage for [facility] Care Management services if appropriate.  Spoke with inpatient LCSW regarding above notes.   Raiford Noble, MSN-Ed, RN,BSN [facility] Liaison [phone]

## 2018-03-01 NOTE — Consult Note (Signed)
WOC Nurse wound consult note Reason for Consult: pressure injuries buttock/coccyx Wound type: 3 areas over the upper left and right buttock Pressure Injury POA: Yes Measurement: 2 areas on the left buttock each 0.5cmx 0.5cm x 0.1cm  1 area on the right buttock 0.2cm x 0.2cm x 0.1cm  Coccyx appears healed Wound NWG:NFAOZ, partial thickness, pink, moist Drainage (amount, consistency, odor) none Periwound:intact  Dressing procedure/placement/frequency: Silicone foam to protect areas.  Mattress replacement for high risk patient, new LLE amputation today. S/P amputation on the RLE previously.   Discussed POC with patient and bedside nurse.  Re consult if needed, will not follow at this time. Thanks  Jamala Kohen [employer], RN,CWOCN, CNS, CWON-AP [phone])

## 2018-03-01 NOTE — Anesthesia Postprocedure Evaluation (Signed)
Anesthesia Post Note  Patient: [name]  Procedure(s) Performed: BELOW KNEE LEFT LEG  AMPUTATION (Left Leg Lower)     Patient location during evaluation: PACU Anesthesia Type: General Level of consciousness: sedated Pain management: pain level controlled Vital Signs Assessment: post-procedure vital signs reviewed and stable Respiratory status: spontaneous breathing and respiratory function stable Cardiovascular status: stable Postop Assessment: no apparent nausea or vomiting Anesthetic complications: no    Last Vitals:  Vitals:   03/01/18 0930 03/01/18 0931  BP:    Pulse: 91 78  Resp: 13 17  Temp: (!) 36.4 C   SpO2: 100% 98%    Last Pain:  Vitals:   03/01/18 0838  TempSrc:   PainSc: 0-No pain                 Dalonte Hardage DANIEL

## 2018-03-01 NOTE — Progress Notes (Addendum)
[facility] Progress Note   Subjective: Awake, alert, no C/Os. Just returned from L BKA.     Objective Vitals:   03/01/18 0922 03/01/18 0929 03/01/18 0930 03/01/18 0931  BP: 99/67 127/60    Pulse: (!) 54 79 91 78  Resp: 17 17 13 17   Temp:   (!) 97.5 F (36.4 C)   TempSrc:      SpO2: 98% 98% 100% 98%  Weight:      Height:       Physical Exam General: Elderly female in NAD Heart: S1,S2, RRR Lungs: CTAB A/P Abdomen: S,NT Extremities: R BKA-no stump edema, new L BKA ace wrap intact.  Dialysis Access: R[facility].    Additional Objective Labs: Basic Metabolic Panel: Recent Labs  Lab 02/28/18 0545 02/28/18 1409 03/01/18 0516  NA 135 136 134*  K 3.4* 3.4* 3.4*  CL 98 97* 96*  CO2 26 28 27   GLUCOSE 123* 139* 130*  BUN 41* 44* 15  CREATININE 7.02* 7.26* 4.17*  CALCIUM 8.6* 8.2* 7.9*  PHOS  --  3.5  --    Liver Function Tests: Recent Labs  Lab 02/27/18 1336 02/28/18 1409  AST 19  --   ALT 11  --   ALKPHOS 114  --   BILITOT 0.8  --   PROT 7.9  --   ALBUMIN 3.0* 2.2*   No results for input(s): LIPASE, AMYLASE in the last 168 hours. CBC: Recent Labs  Lab 02/27/18 1336 02/28/18 0545 02/28/18 1440 03/01/18 0516  WBC 17.9* 18.7* 22.3* 27.2*  NEUTROABS 14.5*  --   --   --   HGB 10.8* 10.5* 10.4* 10.0*  HCT 40.0 37.2 36.7 35.4*  MCV 79.4* 77.5* 78.6* 77.8*  PLT 437* 458* 459* 364   Blood Culture    Component Value Date/Time   SDES BLOOD RIGHT FOREARM 02/27/2018 1910   SPECREQUEST  02/27/2018 1910    BOTTLES DRAWN AEROBIC ONLY Blood Culture adequate volume   CULT  02/27/2018 1910    NO GROWTH 2 DAYS Performed at [facility], [address] [place], [place] [address]    REPTSTATUS PENDING 02/27/2018 1910    Cardiac Enzymes: No results for input(s): CKTOTAL, CKMB, CKMBINDEX, TROPONINI in the last 168 hours. CBG: Recent Labs  Lab 02/28/18 0752 02/28/18 1131 02/28/18 2109 03/01/18 0542 03/01/18 0946  GLUCAP 112*  145* 118* 130* 120*   Iron Studies: No results for input(s): IRON, TIBC, TRANSFERRIN, FERRITIN in the last 72 hours. @lablastinr3 @ Studies/Results: Dg Foot 2 Views Left  Result Date: 02/27/2018 Please see detailed radiograph report in office note.  Medications: . linezolid (ZYVOX) IV 600 mg (03/01/18 0355)  . piperacillin-tazobactam (ZOSYN)  IV 3.375 g (03/01/18 0457)   . amiodarone  200 mg Oral BID  . Chlorhexidine Gluconate Cloth  6 each Topical Q0600  . [START ON 03/07/2018] darbepoetin (ARANESP) injection - DIALYSIS  60 mcg Intravenous Q Tue-HD  . digoxin  0.0625 mg Oral Daily  . docusate sodium  100 mg Oral BID  . doxercalciferol  1 mcg Intravenous Q T,Th,Sa-HD  . feeding supplement (NEPRO CARB STEADY)  237 mL Oral Q24H  . feeding supplement (PRO-STAT SUGAR FREE 64)  30 mL Oral BID  . gabapentin  100 mg Oral QHS  . insulin aspart  0-5 Units Subcutaneous QHS  . insulin aspart  0-9 Units Subcutaneous TID WC  . midodrine  10 mg Oral BID WC  . senna-docusate  2 tablet Oral BID  .  sevelamer carbonate  2,400 mg Oral TID WC     Dialysis Orders: [facility] T,Th,S 4 hrs 180NRe 400/autoflow 2.0 77 kg 2.0 K/2.25 Ca linear sodium, UFP 2 RIJ TDC -Heparin 5000 units IV TIW -Hectorol 1 mcg IV TIW -Mircera 100 mcg IV q 2 weeks (last dose 02/16/2018-last HGB 10.4 02/23/2018)   Assessment/Plan: 1.  Gangrene L Heel/S/P L BKA today per Dr. Randie Heinz. On Zosyn for next 24 hours. Will need EDW lowered.   2.  ESRD -  T,Th,S. HD today on schedule. K+ 3.4 on 4.0 K bath.  3.  Hypertension/volume  -Has been leaving 1.1-1.4 kg under OP EDW. Has been in SNF, probably lost weight. HD 10/22 Pre wt 76.7 Net UF 1.0 Post wt 75.2 kg. BP still on soft side. Continue Midodrine.  4.  Anemia  - HGB 10. ESA due this week. Given Aranesp 60 mcg IV 02/28/18. Follow HGB.  5.  Metabolic bone disease -  Ca 7.9 C Ca 9.3. Phos 3.5. Continue binders, sensipar, VDRA.  6.  Nutrition - Albumin 2.2 Renal/Carb mod diet,  nepro. Renal vits 7.  DM-per primary 8.  H/O Afib on Eliquis. Eliquis currently on hold. Heparin gtt per primary.   Rita H. Brown NP-C 03/01/2018, 11:01 AM  [employer] [phone]  I have seen and examined this patient and agree with plan and assessment in the above note with renal recommendations/intervention highlighted.  Plan for HD tomorrow no heparin and follow post weights.  Jomarie Longs A Shanaya Schneck,MD 03/01/2018 1:15 PM

## 2018-03-01 NOTE — Progress Notes (Signed)
ANTICOAGULATION CONSULT NOTE - Follow Up Consult  Pharmacy Consult for heparin Indication: atrial fibrillation  Labs: Recent Labs    02/27/18 1914 02/28/18 0545 02/28/18 1409 02/28/18 1440 03/01/18 0516  HGB  --  10.5*  --  10.4* 10.0*  HCT  --  37.2  --  36.7 35.4*  PLT  --  458*  --  459* 364  APTT 41* 37*  --   --  92*  HEPARINUNFRC >2.20* >2.20*  --   --  0.98*  CREATININE  --  7.02* 7.26*  --  4.17*    Assessment: 85yo female subtherapeutic on heparin with initial dosing while Eliquis on hold. S/p BKA today with antibiotics to stop 24 hours post surgery Heparin now off after surgery  Goal of Therapy:  aPTT 66-102 seconds   Plan:  To resume Eliquis when appropriate post surgery Continue to follow  Thank you Okey Regal PharmD [phone] 03/01/2018,10:20 AM

## 2018-03-01 NOTE — Transfer of Care (Signed)
Immediate Anesthesia Transfer of Care Note  Patient: [name]  Procedure(s) Performed: BELOW KNEE LEFT LEG  AMPUTATION (Left Leg Lower)  Patient Location: PACU  Anesthesia Type:General  Level of Consciousness: awake, alert  and oriented  Airway & Oxygen Therapy: Patient Spontanous Breathing and Patient connected to nasal cannula oxygen  Post-op Assessment: Report given to RN and Post -op Vital signs reviewed and stable  Post vital signs: Reviewed and stable  Last Vitals:  Vitals Value Taken Time  BP 104/30 03/01/2018  8:38 AM  Temp    Pulse 55 03/01/2018  8:38 AM  Resp 20 03/01/2018  8:38 AM  SpO2 100 % 03/01/2018  8:38 AM  Vitals shown include unvalidated device data.  Last Pain:  Vitals:   03/01/18 0536  TempSrc: Oral  PainSc:       Patients Stated Pain Goal: 0 (02/28/18 1340)  Complications: No apparent anesthesia complications

## 2018-03-02 ENCOUNTER — Encounter (HOSPITAL_COMMUNITY): Payer: Self-pay | Admitting: Vascular Surgery

## 2018-03-02 LAB — RENAL FUNCTION PANEL
Albumin: 2.4 g/dL — ABNORMAL LOW (ref 3.5–5.0)
Anion gap: 13 (ref 5–15)
BUN: 31 mg/dL — ABNORMAL HIGH (ref 8–23)
CO2: 25 mmol/L (ref 22–32)
Calcium: 8.2 mg/dL — ABNORMAL LOW (ref 8.9–10.3)
Chloride: 95 mmol/L — ABNORMAL LOW (ref 98–111)
Creatinine, Ser: 5.7 mg/dL — ABNORMAL HIGH (ref 0.44–1.00)
GFR calc Af Amer: 7 mL/min — ABNORMAL LOW (ref >60)
GFR calc non Af Amer: 6 mL/min — ABNORMAL LOW (ref >60)
Glucose, Bld: 140 mg/dL — ABNORMAL HIGH (ref 70–99)
Phosphorus: 3 mg/dL (ref 2.5–4.6)
Potassium: 3.6 mmol/L (ref 3.5–5.1)
Sodium: 133 mmol/L — ABNORMAL LOW (ref 135–145)

## 2018-03-02 LAB — CBC
HCT: 33.8 % — ABNORMAL LOW (ref 36.0–46.0)
HEMATOCRIT: 32.7 % — AB (ref 36.0–46.0)
HEMOGLOBIN: 8.9 g/dL — AB (ref 12.0–15.0)
Hemoglobin: 9.4 g/dL — ABNORMAL LOW (ref 12.0–15.0)
MCH: 22 pg — AB (ref 26.0–34.0)
MCH: 21.7 pg — ABNORMAL LOW (ref 26.0–34.0)
MCHC: 27.8 g/dL — ABNORMAL LOW (ref 30.0–36.0)
MCHC: 27.2 g/dL — AB (ref 30.0–36.0)
MCV: 79.2 fL — AB (ref 80.0–100.0)
MCV: 79.8 fL — ABNORMAL LOW (ref 80.0–100.0)
Platelets: 422 10*3/uL — ABNORMAL HIGH (ref 150–400)
Platelets: 425 10*3/uL — ABNORMAL HIGH (ref 150–400)
RBC: 4.27 MIL/uL (ref 3.87–5.11)
RBC: 4.1 MIL/uL (ref 3.87–5.11)
RDW: 19.8 % — ABNORMAL HIGH (ref 11.5–15.5)
RDW: 19.7 % — AB (ref 11.5–15.5)
WBC: 49.9 10*3/uL — ABNORMAL HIGH (ref 4.0–10.5)
WBC: 51.8 10*3/uL (ref 4.0–10.5)
nRBC: 0 % (ref 0.0–0.2)
nRBC: 0 % (ref 0.0–0.2)

## 2018-03-02 LAB — GLUCOSE, CAPILLARY
GLUCOSE-CAPILLARY: 155 mg/dL — AB (ref 70–99)
GLUCOSE-CAPILLARY: 103 mg/dL — AB (ref 70–99)
Glucose-Capillary: 156 mg/dL — ABNORMAL HIGH (ref 70–99)
Glucose-Capillary: 94 mg/dL (ref 70–99)
Glucose-Capillary: 109 mg/dL — ABNORMAL HIGH (ref 70–99)

## 2018-03-02 LAB — DIFFERENTIAL
BAND NEUTROPHILS: 0 %
BASOS ABS: 0 10*3/uL (ref 0.0–0.1)
BLASTS: 0 %
Basophils Relative: 0 %
EOS PCT: 3 %
Eosinophils Absolute: 1.3 10*3/uL — ABNORMAL HIGH (ref 0.0–0.5)
LYMPHS ABS: 0 10*3/uL — AB (ref 0.7–4.0)
LYMPHS PCT: 0 %
METAMYELOCYTES PCT: 0 %
MYELOCYTES: 0 %
Monocytes Absolute: 1.3 10*3/uL — ABNORMAL HIGH (ref 0.1–1.0)
Monocytes Relative: 3 %
NEUTROS ABS: 41.3 10*3/uL — AB (ref 1.7–7.7)
NEUTROS PCT: 94 %
NRBC: 0 /100{WBCs}
Other: 0 %
Promyelocytes Relative: 0 %

## 2018-03-02 LAB — PROCALCITONIN: Procalcitonin: 4.97 ng/mL

## 2018-03-02 LAB — APTT: APTT: 37 s — AB (ref 24–36)

## 2018-03-02 LAB — SAVE SMEAR(SSMR), FOR PROVIDER SLIDE REVIEW

## 2018-03-02 MED ORDER — SODIUM CHLORIDE 0.9 % IV SOLN: 100.0000 mL | INTRAVENOUS | Status: DC | PRN

## 2018-03-02 MED ORDER — ALTEPLASE 2 MG IJ SOLR: 2.0000 mg | Freq: Once | INTRAMUSCULAR | Status: DC | PRN

## 2018-03-02 MED ORDER — HEPARIN SODIUM (PORCINE) 1000 UNIT/ML IJ SOLN
INTRAMUSCULAR | Status: AC
  Administered 2018-03-02: 3200 [IU] via INTRAVENOUS_CENTRAL
  Filled 2018-03-02: qty 4

## 2018-03-02 MED ORDER — HEPARIN SODIUM (PORCINE) 1000 UNIT/ML DIALYSIS
1000.0000 [IU] | INTRAMUSCULAR | Status: DC | PRN
  Administered 2018-03-02: 3200 [IU] via INTRAVENOUS_CENTRAL
  Filled 2018-03-02: qty 1

## 2018-03-02 MED ORDER — OXYCODONE-ACETAMINOPHEN 5-325 MG PO TABS
ORAL_TABLET | ORAL | Status: AC
  Administered 2018-03-02: 1 via ORAL
  Filled 2018-03-02: qty 1

## 2018-03-02 MED ORDER — HEPARIN (PORCINE) IN NACL 100-0.45 UNIT/ML-% IJ SOLN
1200.0000 [IU]/h | INTRAMUSCULAR | Status: DC
  Administered 2018-03-02: 1100 [IU]/h via INTRAVENOUS
  Administered 2018-03-03: 1200 [IU]/h via INTRAVENOUS
  Filled 2018-03-02 (×2): qty 250

## 2018-03-02 MED ORDER — PIPERACILLIN-TAZOBACTAM 3.375 G IVPB
3.3750 g | Freq: Two times a day (BID) | INTRAVENOUS | Status: DC
  Administered 2018-03-02 – 2018-03-05 (×6): 3.375 g via INTRAVENOUS
  Filled 2018-03-02 (×6): qty 50

## 2018-03-02 MED ORDER — LINEZOLID 600 MG/300ML IV SOLN
600.0000 mg | Freq: Two times a day (BID) | INTRAVENOUS | Status: DC
  Filled 2018-03-02: qty 300

## 2018-03-02 MED ORDER — DOXERCALCIFEROL 4 MCG/2ML IV SOLN
INTRAVENOUS | Status: AC
  Administered 2018-03-02: 1 ug via INTRAVENOUS
  Filled 2018-03-02: qty 2

## 2018-03-02 MED ORDER — LINEZOLID 600 MG/300ML IV SOLN
600.0000 mg | Freq: Two times a day (BID) | INTRAVENOUS | Status: DC
  Administered 2018-03-02 – 2018-03-05 (×6): 600 mg via INTRAVENOUS
  Filled 2018-03-02 (×6): qty 300

## 2018-03-02 NOTE — Progress Notes (Signed)
  Progress Note    03/02/2018 11:38 AM 1 Day Post-Op  Subjective: Not having much pain left leg  Vitals:   03/02/18 0930 03/02/18 1000  BP: (!) 99/45 102/70  Pulse: (!) 55 98  Resp: 16 16  Temp:    SpO2:      Physical Exam: Awake alert and oriented on dialysis Nonlabored respirations Dressing clean dry intact left leg below the knee  CBC    Component Value Date/Time   WBC 51.8 (HH) 03/02/2018 0807   RBC 4.10 03/02/2018 0807   HGB 8.9 (L) 03/02/2018 0807   HGB 12.8 06/03/2014 1249   HCT 32.7 (L) 03/02/2018 0807   HCT 43.0 06/03/2014 1249   PLT 425 (H) 03/02/2018 0807   PLT 350 06/03/2014 1249   MCV 79.8 (L) 03/02/2018 0807   MCV 76.6 (L) 06/03/2014 1249   MCH 21.7 (L) 03/02/2018 0807   MCHC 27.2 (L) 03/02/2018 0807   RDW 19.7 (H) 03/02/2018 0807   RDW 19.8 (H) 06/03/2014 1249   LYMPHSABS 1.4 02/27/2018 1336   LYMPHSABS 1.6 06/03/2014 1249   MONOABS 1.2 (H) 02/27/2018 1336   MONOABS 0.7 06/03/2014 1249   EOSABS 0.7 (H) 02/27/2018 1336   EOSABS 0.1 06/03/2014 1249   BASOSABS 0.1 02/27/2018 1336   BASOSABS 0.1 06/03/2014 1249    BMET    Component Value Date/Time   NA 133 (L) 03/02/2018 0920   NA 142 06/03/2014 1249   K 3.6 03/02/2018 0920   K 5.3 (H) 06/03/2014 1249   CL 95 (L) 03/02/2018 0920   CO2 25 03/02/2018 0920   CO2 25 06/03/2014 1249   GLUCOSE 140 (H) 03/02/2018 0920   GLUCOSE 107 06/03/2014 1249   BUN 31 (H) 03/02/2018 0920   BUN 75.0 (H) 06/03/2014 1249   CREATININE 5.70 (H) 03/02/2018 0920   CREATININE 12.5 Repeated and Verified (HH) 06/03/2014 1249   CALCIUM 8.2 (L) 03/02/2018 0920   CALCIUM 9.5 06/03/2014 1249   GFRNONAA 6 (L) 03/02/2018 0920   GFRAA 7 (L) 03/02/2018 0920    INR    Component Value Date/Time   INR 1.09 01/08/2018 0854     Intake/Output Summary (Last 24 hours) at 03/02/2018 1138 Last data filed at 03/02/2018 0900 Gross per 24 hour  Intake 890 ml  Output 0 ml  Net 890 ml     Assessment:  84 y.o. female is  s/p left below-knee amputation  Plan: Plan to take dressing down tomorrow to evaluate wound.  Winda Summerall C. Randie Heinz, MD [facility] Office: [phone] Pager: [phone]  03/02/2018 11:38 AM

## 2018-03-02 NOTE — Progress Notes (Signed)
OT Cancellation Note  Patient Details Name: TALAYIA HJORT MRN: [id number] DOB: [birth year]   Cancelled Treatment:    Reason Eval/Treat Not Completed: Patient at procedure or test/ unavailable (HD); will follow up as schedule permits.  Marcy Siren, OT Supplemental Rehabilitation Services Pager [phone] Office [phone]   Orlando Penner 03/02/2018, 9:12 AM

## 2018-03-02 NOTE — Progress Notes (Signed)
ANTICOAGULATION CONSULT NOTE - Follow Up Consult  Pharmacy Consult for Heparin Indication: atrial fibrillation  Patient Measurements: Height: 5\' 2"  (157.5 cm) Weight: 168 lb 3.4 oz (76.3 kg) IBW/kg (Calculated) : 50.1 Heparin Dosing Weight: 67.9 kg  Vital Signs: Temp: 97.2 F (36.2 C) (10/24 0830) Temp Source: Oral (10/24 0830) BP: 102/70 (10/24 1000) Pulse Rate: 98 (10/24 1000)  Labs: Recent Labs    02/27/18 1914 02/28/18 0545 02/28/18 1409  03/01/18 0516 03/02/18 0501 03/02/18 0807 03/02/18 0920  HGB  --  10.5*  --    < > 10.0* 9.4* 8.9*  --   HCT  --  37.2  --    < > 35.4* 33.8* 32.7*  --   PLT  --  458*  --    < > 364 422* 425*  --   APTT 41* 37*  --   --  92*  --   --   --   HEPARINUNFRC >2.20* >2.20*  --   --  0.98*  --   --   --   CREATININE  --  7.02* 7.26*  --  4.17*  --   --  5.70*   < > = values in this interval not displayed.    Estimated Creatinine Clearance: 7.2 mL/min (A) (by C-G formula based on SCr of 5.7 mg/dL (H)).   Medications:  Infusions:  . sodium chloride    . sodium chloride    . linezolid (ZYVOX) IV    . piperacillin-tazobactam (ZOSYN)  IV      Assessment: 85 YOF on apixaban PTA for hx Afib. Held and bridged with Heparin due to need for BKA - done on 10/23. Post-op with WBC elevation and concern for need for revision to AKA. Pharmacy consulted to resume Heparin drip for now.  The patient was previously therapeutic on a rate of 1100 units/hr (11 ml/hr). Heparin levels are falsely elevated with recent apixaban use - will use aPTTs for monitoring.   Goal of Therapy:  Heparin level 0.3-0.7 units/ml aPTT 66-102 seconds Monitor platelets by anticoagulation protocol: Yes   Plan:  - Restart Heparin at 1100 units/hr (11 ml/hr) - Daily HL/aPTT/CBC to start on 10/25 AM - Will continue to monitor for any signs/symptoms of bleeding and will follow up with aPTT in 8 hours   Thank you for allowing pharmacy to be a part of this patient's  care.  Georgina Pillion, PharmD, BCPS Clinical Pharmacist Pager: [phone] Clinical phone for 03/02/2018 from 7a-3:30p: [phone] If after 3:30p, please call main pharmacy at: [phone] Please check AMION for all [facility] Pharmacy numbers 03/02/2018 11:56 AM

## 2018-03-02 NOTE — Progress Notes (Signed)
PT Cancellation Note  Patient Details Name: [name] MRN: [id number] DOB: 09/24/1932   Cancelled Treatment:    Reason Eval/Treat Not Completed: Patient at procedure or test/unavailable(pt at HD. Will follow. )   Tamala Ser PT 03/02/2018  Acute Rehabilitation Services Pager [phone] Office [phone]

## 2018-03-02 NOTE — Procedures (Signed)
I was present at this dialysis session. I have reviewed the session itself and made appropriate changes.   Vital signs in last 24 hours:  Temp:  [97.5 F (36.4 C)-98.3 F (36.8 C)] 97.8 F (36.6 C) (10/24 0458) Pulse Rate:  [54-91] 60 (10/24 0458) Resp:  [13-18] 18 (10/24 0458) BP: (82-127)/(60-67) 82/67 (10/24 0458) SpO2:  [91 %-100 %] 95 % (10/24 0458) Weight change:  Filed Weights   02/27/18 2004 02/28/18 1340 02/28/18 1740  Weight: 76.5 kg 76.7 kg 75.2 kg    Recent Labs  Lab 02/28/18 1409 03/01/18 0516  NA 136 134*  K 3.4* 3.4*  CL 97* 96*  CO2 28 27  GLUCOSE 139* 130*  BUN 44* 15  CREATININE 7.26* 4.17*  CALCIUM 8.2* 7.9*  PHOS 3.5  --     Recent Labs  Lab 02/27/18 1336  02/28/18 1440 03/01/18 0516 03/02/18 0501  WBC 17.9*   < > 22.3* 27.2* 49.9*  NEUTROABS 14.5*  --   --   --   --   HGB 10.8*   < > 10.4* 10.0* 9.4*  HCT 40.0   < > 36.7 35.4* 33.8*  MCV 79.4*   < > 78.6* 77.8* 79.2*  PLT 437*   < > 459* 364 422*   < > = values in this interval not displayed.    Scheduled Meds: . amiodarone  200 mg Oral BID  . Chlorhexidine Gluconate Cloth  6 each Topical Q0600  . cinacalcet  30 mg Oral Q supper  . [START ON 03/07/2018] darbepoetin (ARANESP) injection - DIALYSIS  60 mcg Intravenous Q Tue-HD  . digoxin  0.0625 mg Oral Daily  . docusate sodium  100 mg Oral BID  . doxercalciferol  1 mcg Intravenous Q T,Th,Sa-HD  . feeding supplement (NEPRO CARB STEADY)  237 mL Oral Q24H  . feeding supplement (PRO-STAT SUGAR FREE 64)  30 mL Oral BID  . gabapentin  100 mg Oral QHS  . insulin aspart  0-5 Units Subcutaneous QHS  . insulin aspart  0-9 Units Subcutaneous TID WC  . midodrine  10 mg Oral BID WC  . multivitamin  1 tablet Oral QHS  . senna-docusate  2 tablet Oral BID  . sevelamer carbonate  2,400 mg Oral TID WC   Continuous Infusions: . sodium chloride    . sodium chloride    . linezolid (ZYVOX) IV 600 mg (03/02/18 0434)  . piperacillin-tazobactam (ZOSYN)   IV 3.375 g (03/02/18 0433)   PRN Meds:.sodium chloride, sodium chloride, acetaminophen **OR** acetaminophen, alteplase, bisacodyl, calcium carbonate (dosed in mg elemental calcium), camphor-menthol **AND** hydrOXYzine, docusate sodium, heparin, morphine injection, ondansetron **OR** ondansetron (ZOFRAN) IV, oxyCODONE-acetaminophen, polyethylene glycol, sodium phosphate, sorbitol, zolpidem    Dialysis Orders:[facility] T,Th,S 4 hrs 180NRe 400/autoflow 2.0 77 kg 2.0 K/2.25 Ca linear sodium, UFP 2 RIJ TDC -Heparin 5000 units IV TIW -Hectorol 1 mcg IV TIW -Mircera 100 mcg IV q 2 weeks (last dose 02/16/2018-last HGB 10.4 02/23/2018)   Assessment/Plan: 1. Gangrene L Heel/S/P L BKA today per Dr. Randie Heinz. On Zosyn for next 24 hours. Will need EDW lowered.  2. ESRD - T,Th,S. HD today on schedule. K+ 3.4 on 4.0 K bath. 3. Hypertension/volume -Has been leaving 1.1-1.4 kg under OP EDW. Has been in SNF, probably lost weight. HD 10/22 Pre wt 76.7 Net UF 1.0 Post wt 75.2 kg. BP still on soft side. Continue Midodrine.  4. Anemia - HGB 10. ESA due this week. Given Aranesp 60 mcg IV 02/28/18. Follow  HGB. 5. Metabolic bone disease - Ca 7.9 C Ca 9.3. Phos 3.5. Continue binders, sensipar, VDRA. 6. Nutrition - Albumin 2.2 Renal/Carb mod diet, nepro. Renal vits 7. DM-per primary 8. H/O Afib on Eliquis. Eliquis currently on hold. Heparin gtt per primary.  9. Rash- unclear etiology. On atarax.   Jamie Cords,  MD 03/02/2018, 9:21 AM

## 2018-03-02 NOTE — Progress Notes (Signed)
Dr. Isidoro Donning notified of critical WBC result.  No new orders at this time.

## 2018-03-02 NOTE — Progress Notes (Signed)
[facility]                                                                              Patient Demographics  [name], is a 84 y.o. female, DOB - 09/10/1932, WUJ:[id number]  Admit date - 02/27/2018   Admitting Physician Jonah Blue, MD  Outpatient Primary MD for the patient is Mila Palmer, MD  Outpatient specialists:   LOS - 3  days   Medical records reviewed and are as summarized below:    Chief Complaint  Patient presents with  . Wound Infection       Brief summary  84 y.o.femalewith medical history significant ofCVA; PAD s/p balloon angioplasty in 2/19 and 9/19 and R BKA in 7/19; obesity (BMI 32); hyperparathyroidism; DM; and ESRD on HD presenting with wound infection in the left foot heel.  Patient presented from skilled nursing facility.  Assessment & Plan    Principal Problem:   Gangrene (HCC) of left foot/heel, leukocytosis -Prior history of right BKA, patient had undergone angiography and angioplasty of the left peroneal artery in 01/2018 -Patient presented with gangrenous changes of the left heel area with toe. -Continue IV Zyvox, Zosyn  -Patient underwent left below-knee amputation on 10/23 for the gangrene, vascular surgery following -Overnight patient has not spiked any fevers, looks good, however WBC count jumped to 49.9, repeated again 51.8.  Differential pending, added smear -Procalcitonin 4.97.  Repeat blood cultures today, blood cultures from 10/21 negative so far -Discussed with ID, Dr. Ilsa Iha, it is quite possible that it is still a reactive leukocytosis as patient just had a surgery, recommended to keep antibiotics on for another 24 hours and follow the blood cultures.   -No prior history of hematological issues such as CLL, will follow differential and smear.   Active Problems:   Diabetes mellitus type II, with peripheral vascular disease (HCC) -CBGs fairly stable, continue sliding scale insulin  ESRD on  hemodialysis -HD TTS, nephrology following    Anemia of chronic disease -Hemoglobin 8.9, follow closely, transfuse if needed for less than 7.5    Benign essential HTN -BP currently stable  History of atrial fibrillation on Eliquis Eliquis currently on hold, patient was placed on heparin drip for the surgery For now continue heparin drip, until patient fairly stable and does not need any further procedures  Code Status: Full CODE STATUS DVT Prophylaxis: Heparin drip Family Communication: Discussed in detail with the patient, all imaging results, lab results explained to the patient and niece at the bedside   Disposition Plan: When cleared by vascular surgery  Time Spent in minutes 25 minutes  Procedures:  Left BKA 10/23 Hemodialysis  Consultants:   Vascular surgery Nephrology  Antimicrobials:      Medications  Scheduled Meds: . amiodarone  200 mg Oral BID  . Chlorhexidine Gluconate Cloth  6 each Topical Q0600  . cinacalcet  30 mg Oral Q supper  . [START ON 03/07/2018] darbepoetin (ARANESP) injection - DIALYSIS  60 mcg Intravenous Q Tue-HD  . digoxin  0.0625 mg Oral Daily  . docusate sodium  100 mg Oral BID  . doxercalciferol  1 mcg Intravenous Q T,Th,Sa-HD  .  feeding supplement (NEPRO CARB STEADY)  237 mL Oral Q24H  . feeding supplement (PRO-STAT SUGAR FREE 64)  30 mL Oral BID  . gabapentin  100 mg Oral QHS  . heparin      . insulin aspart  0-5 Units Subcutaneous QHS  . insulin aspart  0-9 Units Subcutaneous TID WC  . midodrine  10 mg Oral BID WC  . multivitamin  1 tablet Oral QHS  . senna-docusate  2 tablet Oral BID  . sevelamer carbonate  2,400 mg Oral TID WC   Continuous Infusions: . sodium chloride    . sodium chloride    . heparin    . linezolid (ZYVOX) IV    . piperacillin-tazobactam (ZOSYN)  IV     PRN Meds:.sodium chloride, sodium chloride, acetaminophen **OR** acetaminophen, alteplase, bisacodyl, calcium carbonate (dosed in mg elemental calcium),  camphor-menthol **AND** hydrOXYzine, docusate sodium, heparin, morphine injection, ondansetron **OR** ondansetron (ZOFRAN) IV, oxyCODONE-acetaminophen, polyethylene glycol, sodium phosphate, sorbitol, zolpidem   Antibiotics   Anti-infectives (From admission, onward)   Start     Dose/Rate Route Frequency Ordered Stop   03/02/18 1600  piperacillin-tazobactam (ZOSYN) IVPB 3.375 g     3.375 g 12.5 mL/hr over 240 Minutes Intravenous Every 12 hours 03/02/18 0947     03/02/18 1600  linezolid (ZYVOX) IVPB 600 mg     600 mg 300 mL/hr over 60 Minutes Intravenous Every 12 hours 03/02/18 0947     02/27/18 1600  piperacillin-tazobactam (ZOSYN) IVPB 2.25 g  Status:  Discontinued     2.25 g 100 mL/hr over 30 Minutes Intravenous Every 8 hours 02/27/18 1527 02/27/18 1544   02/27/18 1600  linezolid (ZYVOX) IVPB 600 mg  Status:  Discontinued     600 mg 300 mL/hr over 60 Minutes Intravenous Every 12 hours 02/27/18 1537 03/02/18 0947   02/27/18 1600  piperacillin-tazobactam (ZOSYN) IVPB 3.375 g  Status:  Discontinued     3.375 g 12.5 mL/hr over 240 Minutes Intravenous Every 12 hours 02/27/18 1544 03/02/18 0947        Subjective:   [name] was seen and examined today.  Alert and oriented, denies any specific complaints.  Did not spike any fevers overnight.  No acute events overnight.  Niece at the bedside.  Patient denies dizziness, chest pain, shortness of breath, abdominal pain, N/V/D/C.  Afebrile  Objective:   Vitals:   03/02/18 1030 03/02/18 1100 03/02/18 1130 03/02/18 1200  BP: (!) 96/41 (!) 97/51 (!) 102/44 (!) 110/52  Pulse: 69 61 (!) 59 62  Resp: 16 16 16 16   Temp:      TempSrc:      SpO2:      Weight:      Height:        Intake/Output Summary (Last 24 hours) at 03/02/2018 1308 Last data filed at 03/02/2018 0900 Gross per 24 hour  Intake 890 ml  Output 0 ml  Net 890 ml     Wt Readings from Last 3 Encounters:  03/02/18 76.3 kg  02/27/18 76.9 kg  02/24/18 76.9 kg       Exam General: Alert and oriented x 3, NAD Eyes:  HEENT:   Cardiovascular: S1 S2 auscultated,  Regular rate and rhythm. Respiratory: Clear to auscultation bilaterally, no wheezing, rales or rhonchi Gastrointestinal: Soft, nontender, nondistended, + bowel sounds Ext: left BKA dressing intact.  Right BKA Neuro: no new FND's Musculoskeletal: No digital cyanosis, clubbing Skin: Bilateral BKA Psych: Normal affect and demeanor, alert and oriented x3  Data Reviewed:  I have personally reviewed following labs and imaging studies  Micro Results Recent Results (from the past 240 hour(s))  Blood Cultures x 2 sites     Status: None (Preliminary result)   Collection Time: 02/27/18  5:15 PM  Result Value Ref Range Status   Specimen Description BLOOD RIGHT ANTECUBITAL  Final   Special Requests   Final    BOTTLES DRAWN AEROBIC AND ANAEROBIC Blood Culture adequate volume   Culture   Final    NO GROWTH 3 DAYS Performed at [facility], [address] [place], [place] [address]    Report Status PENDING  Incomplete  Blood Cultures x 2 sites     Status: None (Preliminary result)   Collection Time: 02/27/18  7:10 PM  Result Value Ref Range Status   Specimen Description BLOOD RIGHT FOREARM  Final   Special Requests   Final    BOTTLES DRAWN AEROBIC ONLY Blood Culture adequate volume   Culture   Final    NO GROWTH 3 DAYS Performed at [facility], [address] [place], [place] [address]    Report Status PENDING  Incomplete  MRSA PCR Screening     Status: None   Collection Time: 02/27/18 11:04 PM  Result Value Ref Range Status   MRSA by PCR NEGATIVE NEGATIVE Final    Comment:        The GeneXpert MRSA Assay (FDA approved for NASAL specimens only), is one component of a comprehensive MRSA colonization surveillance program. It is not intended to diagnose MRSA infection nor to guide or monitor treatment for MRSA infections. Performed at [facility],  [address] [place], [place] [address]     Radiology Reports Dg Foot 2 Views Left  Result Date: 02/27/2018 Please see detailed radiograph report in office note.   Lab Data:  CBC: Recent Labs  Lab 02/27/18 1336 02/28/18 0545 02/28/18 1440 03/01/18 0516 03/02/18 0501 03/02/18 0807  WBC 17.9* 18.7* 22.3* 27.2* 49.9* 51.8*  NEUTROABS 14.5*  --   --   --   --   --   HGB 10.8* 10.5* 10.4* 10.0* 9.4* 8.9*  HCT 40.0 37.2 36.7 35.4* 33.8* 32.7*  MCV 79.4* 77.5* 78.6* 77.8* 79.2* 79.8*  PLT 437* 458* 459* 364 422* 425*   Basic Metabolic Panel: Recent Labs  Lab 02/27/18 1336 02/28/18 0545 02/28/18 1409 03/01/18 0516 03/02/18 0920  NA 137 135 136 134* 133*  K 3.3* 3.4* 3.4* 3.4* 3.6  CL 95* 98 97* 96* 95*  CO2 27 26 28 27 25   GLUCOSE 119* 123* 139* 130* 140*  BUN 35* 41* 44* 15 31*  CREATININE 6.13* 7.02* 7.26* 4.17* 5.70*  CALCIUM 9.2 8.6* 8.2* 7.9* 8.2*  PHOS  --   --  3.5  --  3.0   GFR: Estimated Creatinine Clearance: 7.2 mL/min (A) (by C-G formula based on SCr of 5.7 mg/dL (H)). Liver Function Tests: Recent Labs  Lab 02/27/18 1336 02/28/18 1409 03/02/18 0920  AST 19  --   --   ALT 11  --   --   ALKPHOS 114  --   --   BILITOT 0.8  --   --   PROT 7.9  --   --   ALBUMIN 3.0* 2.2* 2.4*   No results for input(s): LIPASE, AMYLASE in the last 168 hours. No results for input(s): AMMONIA in the last 168 hours. Coagulation Profile: No results for input(s): INR, PROTIME in the  last 168 hours. Cardiac Enzymes: No results for input(s): CKTOTAL, CKMB, CKMBINDEX, TROPONINI in the last 168 hours. BNP (last 3 results) No results for input(s): PROBNP in the last 8760 hours. HbA1C: No results for input(s): HGBA1C in the last 72 hours. CBG: Recent Labs  Lab 03/01/18 0838 03/01/18 0946 03/01/18 1117 03/01/18 1644 03/02/18 0731  GLUCAP 116* 120* 151* 166* 155*   Lipid Profile: No results for input(s): CHOL, HDL, LDLCALC, TRIG, CHOLHDL, LDLDIRECT in the last 72  hours. Thyroid Function Tests: No results for input(s): TSH, T4TOTAL, FREET4, T3FREE, THYROIDAB in the last 72 hours. Anemia Panel: No results for input(s): VITAMINB12, FOLATE, FERRITIN, TIBC, IRON, RETICCTPCT in the last 72 hours. Urine analysis:    Component Value Date/Time   COLORURINE YELLOW 12/17/2006 0005   APPEARANCEUR TURBID (A) 12/17/2006 0005   LABSPEC 1.019 12/17/2006 0005   PHURINE 5.0 12/17/2006 0005   GLUCOSEU NEGATIVE 12/17/2006 0005   HGBUR MODERATE (A) 12/17/2006 0005   BILIRUBINUR SMALL (A) 12/17/2006 0005   KETONESUR 15 (A) 12/17/2006 0005   PROTEINUR NEGATIVE 12/17/2006 0005   UROBILINOGEN 0.2 12/17/2006 0005   NITRITE NEGATIVE 12/17/2006 0005   LEUKOCYTESUR LARGE (A) 12/17/2006 0005     Trusten Hume M.D. [facility] 03/02/2018, 1:08 PM  Pager: [phone] Between 7am to 7pm - call Pager - [phone]  After 7pm go to [email or web address] - password [email or web address]  Call night coverage person covering after 7pm

## 2018-03-03 ENCOUNTER — Telehealth: Payer: Self-pay | Admitting: Vascular Surgery

## 2018-03-03 ENCOUNTER — Ambulatory Visit: Payer: Medicare Other | Admitting: Podiatry

## 2018-03-03 LAB — CBC WITH DIFFERENTIAL/PLATELET
BASOS PCT: 1 %
Basophils Absolute: 0.4 10*3/uL — ABNORMAL HIGH (ref 0.0–0.1)
EOS ABS: 0.4 10*3/uL (ref 0.0–0.5)
Eosinophils Relative: 1 %
HCT: 35.6 % — ABNORMAL LOW (ref 36.0–46.0)
HEMOGLOBIN: 9.8 g/dL — AB (ref 12.0–15.0)
Lymphocytes Relative: 3 %
Lymphs Abs: 1.3 10*3/uL (ref 0.7–4.0)
MCH: 21.9 pg — ABNORMAL LOW (ref 26.0–34.0)
MCHC: 27.5 g/dL — AB (ref 30.0–36.0)
MCV: 79.6 fL — ABNORMAL LOW (ref 80.0–100.0)
Monocytes Absolute: 0.4 10*3/uL (ref 0.1–1.0)
Monocytes Relative: 1 %
NEUTROS PCT: 94 %
NRBC: 0 /100{WBCs}
NRBC: 0.1 % (ref 0.0–0.2)
Neutro Abs: 40 10*3/uL — ABNORMAL HIGH (ref 1.7–7.7)
Platelets: 370 10*3/uL (ref 150–400)
RBC: 4.47 MIL/uL (ref 3.87–5.11)
RDW: 19.9 % — ABNORMAL HIGH (ref 11.5–15.5)
WBC: 42.6 10*3/uL — ABNORMAL HIGH (ref 4.0–10.5)

## 2018-03-03 LAB — BASIC METABOLIC PANEL
Anion gap: 13 (ref 5–15)
BUN: 19 mg/dL (ref 8–23)
CALCIUM: 7.9 mg/dL — AB (ref 8.9–10.3)
CO2: 22 mmol/L (ref 22–32)
Chloride: 98 mmol/L (ref 98–111)
Creatinine, Ser: 3.62 mg/dL — ABNORMAL HIGH (ref 0.44–1.00)
GFR calc Af Amer: 12 mL/min — ABNORMAL LOW (ref >60)
GFR, EST NON AFRICAN AMERICAN: 11 mL/min — AB (ref >60)
GLUCOSE: 136 mg/dL — AB (ref 70–99)
POTASSIUM: 4.2 mmol/L (ref 3.5–5.1)
Sodium: 133 mmol/L — ABNORMAL LOW (ref 135–145)

## 2018-03-03 LAB — GLUCOSE, CAPILLARY
GLUCOSE-CAPILLARY: 142 mg/dL — AB (ref 70–99)
GLUCOSE-CAPILLARY: 121 mg/dL — AB (ref 70–99)
GLUCOSE-CAPILLARY: 184 mg/dL — AB (ref 70–99)
Glucose-Capillary: 134 mg/dL — ABNORMAL HIGH (ref 70–99)

## 2018-03-03 MED ORDER — CHLORHEXIDINE GLUCONATE CLOTH 2 % EX PADS: 6.0000 | MEDICATED_PAD | Freq: Every day | CUTANEOUS | Status: DC

## 2018-03-03 MED ORDER — APIXABAN 2.5 MG PO TABS
2.5000 mg | ORAL_TABLET | Freq: Two times a day (BID) | ORAL | Status: DC
  Administered 2018-03-03 – 2018-03-05 (×4): 2.5 mg via ORAL
  Filled 2018-03-03 (×4): qty 1

## 2018-03-03 NOTE — Evaluation (Signed)
Occupational Therapy Evaluation Patient Details Name: [name] MRN: [id number] DOB: 10-07-1932 Today's Date: 03/03/2018    History of Present Illness This 84 y.o. female admitted from SNF admitted with wound infection Lt foot heel.   SHe underwent Lt BKA 03/01/18.   PMH includes s/p Rt BKA, CVA, PAD, s/p balloon angioplasty, DM, ESRD with HD    Clinical Impression   Pt admitted with the above and demonstrates the below listed deficits.  She requires mod - total a for ADLs, and mod A for bed mobility.  She was able to move into long sitting x 3 with supervision and use of bedrails.  She deferred attempts to transfer due to fear of pain.  Pt admitted from SNF.  She indicates she required assist for ADLs.  Recommend return to SNF at discharge.  All further OT needs can be addressed by SNF OT, therefore acute OT will sign off at this time.     Follow Up Recommendations  SNF    Equipment Recommendations  None recommended by OT    Recommendations for Other Services       Precautions / Restrictions Precautions Precautions: Fall Precaution Comments: bil. BKA  Restrictions Weight Bearing Restrictions: No      Mobility Bed Mobility Overal bed mobility: Needs Assistance Bed Mobility: Rolling Rolling: Min assist            Transfers                 General transfer comment: Pt deferred due to pain     Balance                                           ADL either performed or assessed with clinical judgement   ADL Overall ADL's : Needs assistance/impaired Eating/Feeding: Set up;Bed level   Grooming: Wash/dry hands;Wash/dry face;Oral care;Min guard;Set up;Bed level   Upper Body Bathing: Moderate assistance;Bed level   Lower Body Bathing: Maximal assistance;Bed level   Upper Body Dressing : Moderate assistance;Bed level   Lower Body Dressing: Total assistance;Bed level   Toilet Transfer: Total assistance Toilet Transfer Details (indicate  cue type and reason): unable  Toileting- Clothing Manipulation and Hygiene: Total assistance;Bed level       Functional mobility during ADLs: Total assistance General ADL Comments: Pt only willing to move into long sitting in bed with use of bedrails      Vision         Perception     Praxis      Pertinent Vitals/Pain Pain Assessment: Faces Faces Pain Scale: Hurts little more Pain Location: Lt BKA with activity  Pain Descriptors / Indicators: Grimacing;Operative site guarding Pain Intervention(s): Limited activity within patient's tolerance     Hand Dominance Right   Extremity/Trunk Assessment Upper Extremity Assessment Upper Extremity Assessment: Generalized weakness   Lower Extremity Assessment Lower Extremity Assessment: Defer to PT evaluation   Cervical / Trunk Assessment Cervical / Trunk Assessment: Other exceptions(generalized trunk weakness )   Communication Communication Communication: No difficulties   Cognition Arousal/Alertness: Lethargic Behavior During Therapy: WFL for tasks assessed/performed Overall Cognitive Status: No family/caregiver present to determine baseline cognitive functioning                                 General Comments: Pt slow to respond.  Unsure of accuracy of responses    General Comments  Pt moved into long sitting x 3 using bedrails with supervision     Exercises Exercises: General Upper Extremity General Exercises - Upper Extremity Shoulder Flexion: AROM;Right;Left;20 reps;Supine Elbow Flexion: AROM;Right;Left;10 reps;Supine   Shoulder Instructions      Home Living Family/patient expects to be discharged to:: Skilled nursing facility                                 Additional Comments: Pt admitted from SNF       Prior Functioning/Environment Level of Independence: Needs assistance        Comments: Pt indicates she has not been able to transfer due to heel wound, and has required  assist for ADLs.  Prior to July, pt reports she was ambulatory with prosthesis         OT Problem List: Decreased strength;Decreased activity tolerance;Impaired balance (sitting and/or standing);Decreased cognition;Pain;Decreased knowledge of use of DME or AE      OT Treatment/Interventions:      OT Goals(Current goals can be found in the care plan section) Acute Rehab OT Goals Patient Stated Goal: I want to walk again  OT Goal Formulation: All assessment and education complete, DC therapy  OT Frequency:     Barriers to D/C:            Co-evaluation              AM-PAC PT "6 Clicks" Daily Activity     Outcome Measure Help from another person eating meals?: None Help from another person taking care of personal grooming?: A Little Help from another person toileting, which includes using toliet, bedpan, or urinal?: Total Help from another person bathing (including washing, rinsing, drying)?: A Lot Help from another person to put on and taking off regular upper body clothing?: A Lot Help from another person to put on and taking off regular lower body clothing?: Total 6 Click Score: 13   End of Session    Activity Tolerance: Patient limited by pain;Patient limited by fatigue Patient left: in bed;with call bell/phone within reach  OT Visit Diagnosis: Pain Pain - Right/Left: Left Pain - part of body: Leg                Time: 1401-1410 OT Time Calculation (min): 9 min Charges:  OT General Charges $OT Visit: 1 Visit OT Evaluation $OT Eval Moderate Complexity: 1 Mod  Jeani Hawking, OTR/L Acute Rehabilitation Services Pager [phone] Office [phone]   Jeani Hawking M 03/03/2018, 2:47 PM

## 2018-03-03 NOTE — Progress Notes (Addendum)
[facility]                                                                              Patient Demographics  [name], is a 84 y.o. female, DOB - 01-Aug-1932, EAV:[id number]  Admit date - 02/27/2018   Admitting Physician Jonah Blue, MD  Outpatient Primary MD for the patient is Mila Palmer, MD  Outpatient specialists:   LOS - 4  days   Medical records reviewed and are as summarized below:    Chief Complaint  Patient presents with  . Wound Infection       Brief summary  84 y.o.femalewith medical history significant ofCVA; PAD s/p balloon angioplasty in 2/19 and 9/19 and R BKA in 7/19; obesity (BMI 32); hyperparathyroidism; DM; and ESRD on HD presenting with wound infection in the left foot heel.  Patient presented from skilled nursing facility.  Assessment & Plan    Principal Problem:   Gangrene (HCC) of left foot/heel, leukocytosis -Prior history of right BKA, patient had undergone angiography and angioplasty of the left peroneal artery in 01/2018 -Patient presented with gangrenous changes of the left heel area with toe. -Continue IV Zyvox, Zosyn  -Patient underwent left BKA on 10/23 for the gangrene, stable from VS standpoint -White count trending down from 51.8 on 10/24-> 42.6 today, not spiking any fevers, will continue IV antibiotics for a week, d/w nephrology.  Repeat blood cultures also negative. -Patient encouraged to work with PT   Active Problems:   Diabetes mellitus type II, with peripheral vascular disease (HCC) -CBG stable, continue sliding scale insulin  ESRD on hemodialysis -HD TTS, nephrology following    Anemia of chronic disease -H&H stable 9.8     Benign essential HTN -BP stable  History of atrial fibrillation on Eliquis Restart Eliquis   Stage 2 decubiti to buttock and sacral areas  - wound care per nursing    Code Status: Full CODE STATUS DVT Prophylaxis: Restarting Eliquis Family Communication:  Discussed in detail with the patient, all imaging results, lab results explained to the patient and niece at the bedside   Disposition Plan: Hopefully, tomorrow, needs skilled nursing facility  Time Spent in minutes 25 minutes  Procedures:  Left BKA 10/23 Hemodialysis  Consultants:   Vascular surgery Nephrology  Antimicrobials:      Medications  Scheduled Meds: . amiodarone  200 mg Oral BID  . apixaban  2.5 mg Oral BID  . Chlorhexidine Gluconate Cloth  6 each Topical Q0600  . cinacalcet  30 mg Oral Q supper  . [START ON 03/07/2018] darbepoetin (ARANESP) injection - DIALYSIS  60 mcg Intravenous Q Tue-HD  . digoxin  0.0625 mg Oral Daily  . docusate sodium  100 mg Oral BID  . doxercalciferol  1 mcg Intravenous Q T,Th,Sa-HD  . feeding supplement (NEPRO CARB STEADY)  237 mL Oral Q24H  . feeding supplement (PRO-STAT SUGAR FREE 64)  30 mL Oral BID  . gabapentin  100 mg Oral QHS  . insulin aspart  0-5 Units Subcutaneous QHS  . insulin aspart  0-9 Units Subcutaneous TID WC  . midodrine  10 mg Oral BID WC  .  multivitamin  1 tablet Oral QHS  . senna-docusate  2 tablet Oral BID  . sevelamer carbonate  2,400 mg Oral TID WC   Continuous Infusions: . linezolid (ZYVOX) IV 600 mg (03/03/18 0401)  . piperacillin-tazobactam (ZOSYN)  IV 3.375 g (03/03/18 0526)   PRN Meds:.acetaminophen **OR** acetaminophen, bisacodyl, calcium carbonate (dosed in mg elemental calcium), camphor-menthol **AND** hydrOXYzine, docusate sodium, ondansetron **OR** ondansetron (ZOFRAN) IV, oxyCODONE-acetaminophen, polyethylene glycol, sodium phosphate, sorbitol, zolpidem   Antibiotics   Anti-infectives (From admission, onward)   Start     Dose/Rate Route Frequency Ordered Stop   03/02/18 1600  piperacillin-tazobactam (ZOSYN) IVPB 3.375 g     3.375 g 12.5 mL/hr over 240 Minutes Intravenous Every 12 hours 03/02/18 0947     03/02/18 1600  linezolid (ZYVOX) IVPB 600 mg  Status:  Discontinued     600 mg 300  mL/hr over 60 Minutes Intravenous Every 12 hours 03/02/18 0947 03/02/18 1315   03/02/18 1600  linezolid (ZYVOX) IVPB 600 mg     600 mg 300 mL/hr over 60 Minutes Intravenous Every 12 hours 03/02/18 1315     02/27/18 1600  piperacillin-tazobactam (ZOSYN) IVPB 2.25 g  Status:  Discontinued     2.25 g 100 mL/hr over 30 Minutes Intravenous Every 8 hours 02/27/18 1527 02/27/18 1544   02/27/18 1600  linezolid (ZYVOX) IVPB 600 mg  Status:  Discontinued     600 mg 300 mL/hr over 60 Minutes Intravenous Every 12 hours 02/27/18 1537 03/02/18 0947   02/27/18 1600  piperacillin-tazobactam (ZOSYN) IVPB 3.375 g  Status:  Discontinued     3.375 g 12.5 mL/hr over 240 Minutes Intravenous Every 12 hours 02/27/18 1544 03/02/18 0947        Subjective:   Darly Massi was seen and examined today.  Overnight had pain no issues needed pain medication, today sleepy but arousable.  No fevers or chills.  Niece at the bedside. Patient denies dizziness, chest pain, shortness of breath, abdominal pain.  Per niece, patient had a BM this morning  Objective:   Vitals:   03/02/18 1705 03/02/18 2116 03/03/18 0057 03/03/18 0514  BP: (!) 104/48 (!) 111/52 (!) 114/49 (!) 100/48  Pulse: 61 67 71 64  Resp: 18 20 18 18   Temp: 98.2 F (36.8 C) 98.3 F (36.8 C) 98 F (36.7 C) 98 F (36.7 C)  TempSrc: Oral Oral Oral   SpO2:  94% 100% 90%  Weight:  77.1 kg    Height:        Intake/Output Summary (Last 24 hours) at 03/03/2018 1131 Last data filed at 03/03/2018 0956 Gross per 24 hour  Intake 324.6 ml  Output 2028 ml  Net -1703.4 ml     Wt Readings from Last 3 Encounters:  03/02/18 77.1 kg  02/27/18 76.9 kg  02/24/18 76.9 kg      Exam    General: Sleepy but arousable  Eyes:   HEENT:    Cardiovascular: S1 S2 auscultated,  Regular rate and rhythm. No pedal edema b/l  Respiratory: Clear to auscultation bilaterally, no wheezing, rales or rhonchi  Gastrointestinal: Soft, nontender, nondistended, +  bowel sounds  Ext: no pedal edema bilaterally  Neuro:   Musculoskeletal: Left BKA, nail, dressing intact, right BKA  Skin: No rashes  Psych: Sleepy    Data Reviewed:  I have personally reviewed following labs and imaging studies  Micro Results Recent Results (from the past 240 hour(s))  Blood Cultures x 2 sites     Status: None (Preliminary  result)   Collection Time: 02/27/18  5:15 PM  Result Value Ref Range Status   Specimen Description BLOOD RIGHT ANTECUBITAL  Final   Special Requests   Final    BOTTLES DRAWN AEROBIC AND ANAEROBIC Blood Culture adequate volume   Culture   Final    NO GROWTH 4 DAYS Performed at [facility], [address], [place], [place] [address]    Report Status PENDING  Incomplete  Blood Cultures x 2 sites     Status: None (Preliminary result)   Collection Time: 02/27/18  7:10 PM  Result Value Ref Range Status   Specimen Description BLOOD RIGHT FOREARM  Final   Special Requests   Final    BOTTLES DRAWN AEROBIC ONLY Blood Culture adequate volume   Culture   Final    NO GROWTH 4 DAYS Performed at [facility], [address] [place], [place] [address]    Report Status PENDING  Incomplete  MRSA PCR Screening     Status: None   Collection Time: 02/27/18 11:04 PM  Result Value Ref Range Status   MRSA by PCR NEGATIVE NEGATIVE Final    Comment:        The GeneXpert MRSA Assay (FDA approved for NASAL specimens only), is one component of a comprehensive MRSA colonization surveillance program. It is not intended to diagnose MRSA infection nor to guide or monitor treatment for MRSA infections. Performed at [facility], [address] [place], [place] [address]   Culture, blood (routine x 2)     Status: None (Preliminary result)   Collection Time: 03/02/18  2:30 PM  Result Value Ref Range Status   Specimen Description BLOOD LEFT ANTECUBITAL  Final   Special Requests   Final    BOTTLES DRAWN AEROBIC ONLY Blood Culture  adequate volume   Culture   Final    NO GROWTH < 24 HOURS Performed at [facility], [address], [place], [place] [address]    Report Status PENDING  Incomplete  Culture, blood (routine x 2)     Status: None (Preliminary result)   Collection Time: 03/02/18  2:44 PM  Result Value Ref Range Status   Specimen Description BLOOD LEFT ANTECUBITAL  Final   Special Requests   Final    BOTTLES DRAWN AEROBIC ONLY Blood Culture adequate volume   Culture   Final    NO GROWTH < 24 HOURS Performed at [facility], [address] [place], [place] [address]    Report Status PENDING  Incomplete    Radiology Reports Dg Foot 2 Views Left  Result Date: 02/27/2018 Please see detailed radiograph report in office note.   Lab Data:  CBC: Recent Labs  Lab 02/27/18 1336  02/28/18 1440 03/01/18 0516 03/02/18 0501 03/02/18 0807 03/02/18 1445 03/03/18 0746  WBC 17.9*   < > 22.3* 27.2* 49.9* 51.8*  --  42.6*  NEUTROABS 14.5*  --   --   --   --   --  41.3* 40.0*  HGB 10.8*   < > 10.4* 10.0* 9.4* 8.9*  --  9.8*  HCT 40.0   < > 36.7 35.4* 33.8* 32.7*  --  35.6*  MCV 79.4*   < > 78.6* 77.8* 79.2* 79.8*  --  79.6*  PLT 437*   < > 459* 364 422* 425*  --  370   < > = values in this interval not displayed.   Basic Metabolic Panel: Recent Labs  Lab 02/28/18 0545 02/28/18 1409 03/01/18 0516 03/02/18 0920 03/03/18 0746  NA 135 136 134* 133* 133*  K 3.4* 3.4* 3.4* 3.6 4.2  CL 98 97* 96* 95* 98  CO2 26 28 27 25 22   GLUCOSE 123* 139* 130* 140* 136*  BUN 41* 44* 15 31* 19  CREATININE 7.02* 7.26* 4.17* 5.70* 3.62*  CALCIUM 8.6* 8.2* 7.9* 8.2* 7.9*  PHOS  --  3.5  --  3.0  --    GFR: Estimated Creatinine Clearance: 11.3 mL/min (A) (by C-G formula based on SCr of 3.62 mg/dL (H)). Liver Function Tests: Recent Labs  Lab 02/27/18 1336 02/28/18 1409 03/02/18 0920  AST 19  --   --   ALT 11  --   --   ALKPHOS 114  --   --   BILITOT 0.8  --   --   PROT 7.9  --   --   ALBUMIN  3.0* 2.2* 2.4*   No results for input(s): LIPASE, AMYLASE in the last 168 hours. No results for input(s): AMMONIA in the last 168 hours. Coagulation Profile: No results for input(s): INR, PROTIME in the last 168 hours. Cardiac Enzymes: No results for input(s): CKTOTAL, CKMB, CKMBINDEX, TROPONINI in the last 168 hours. BNP (last 3 results) No results for input(s): PROBNP in the last 8760 hours. HbA1C: No results for input(s): HGBA1C in the last 72 hours. CBG: Recent Labs  Lab 03/02/18 0731 03/02/18 1332 03/02/18 1635 03/02/18 2115 03/03/18 0748  GLUCAP 155* 94 109* 103* 142*   Lipid Profile: No results for input(s): CHOL, HDL, LDLCALC, TRIG, CHOLHDL, LDLDIRECT in the last 72 hours. Thyroid Function Tests: No results for input(s): TSH, T4TOTAL, FREET4, T3FREE, THYROIDAB in the last 72 hours. Anemia Panel: No results for input(s): VITAMINB12, FOLATE, FERRITIN, TIBC, IRON, RETICCTPCT in the last 72 hours. Urine analysis:    Component Value Date/Time   COLORURINE YELLOW 12/17/2006 0005   APPEARANCEUR TURBID (A) 12/17/2006 0005   LABSPEC 1.019 12/17/2006 0005   PHURINE 5.0 12/17/2006 0005   GLUCOSEU NEGATIVE 12/17/2006 0005   HGBUR MODERATE (A) 12/17/2006 0005   BILIRUBINUR SMALL (A) 12/17/2006 0005   KETONESUR 15 (A) 12/17/2006 0005   PROTEINUR NEGATIVE 12/17/2006 0005   UROBILINOGEN 0.2 12/17/2006 0005   NITRITE NEGATIVE 12/17/2006 0005   LEUKOCYTESUR LARGE (A) 12/17/2006 0005     Lavere Stork M.D. [facility] 03/03/2018, 11:31 AM  Pager: [phone] Between 7am to 7pm - call Pager - [phone]  After 7pm go to [email or web address] - password [email or web address]  Call night coverage person covering after 7pm

## 2018-03-03 NOTE — Clinical Social Work Note (Signed)
When medically stable, Ms. Nuncio will discharge home as she has used all of 100 Medicare days at [facility]. Patient's niece and primary contact, [name] [phone]) is aware and advised CSW that patient will discharge to her son's home with [facility] services. [name] added that there is lots of family that will assist in caring for her. CSW signing off, however please reconsult if any other SW intervention services are needed prior to discharge.  Genelle Bal, MSW, LCSW Licensed Clinical Social Worker Clinical Social Work Department [employer] [phone]

## 2018-03-03 NOTE — Progress Notes (Signed)
ANTICOAGULATION CONSULT NOTE - Follow Up Consult  Pharmacy Consult for Heparin >> Apixaban Indication: atrial fibrillation  Patient Measurements: Height: 5\' 2"  (157.5 cm) Weight: 169 lb 15.6 oz (77.1 kg) IBW/kg (Calculated) : 50.1 Heparin Dosing Weight: 67.9 kg  Vital Signs: Temp: 98 F (36.7 C) (10/25 0514) Temp Source: Oral (10/25 0057) BP: 100/48 (10/25 0514) Pulse Rate: 64 (10/25 0514)  Labs: Recent Labs    03/01/18 0516 03/02/18 0501 03/02/18 0807 03/02/18 0920 03/02/18 2238 03/03/18 0746  HGB 10.0* 9.4* 8.9*  --   --  9.8*  HCT 35.4* 33.8* 32.7*  --   --  35.6*  PLT 364 422* 425*  --   --  370  APTT 92*  --   --   --  37*  --   HEPARINUNFRC 0.98*  --   --   --   --   --   CREATININE 4.17*  --   --  5.70*  --  3.62*    Estimated Creatinine Clearance: 11.3 mL/min (A) (by C-G formula based on SCr of 3.62 mg/dL (H)).   Medications:  Infusions:  . heparin 1,200 Units/hr (03/03/18 0856)  . linezolid (ZYVOX) IV 600 mg (03/03/18 0401)  . piperacillin-tazobactam (ZOSYN)  IV 3.375 g (03/03/18 0526)    Assessment: 85 YOF on apixaban PTA for hx Afib. Held and bridged with Heparin due to need for BKA - done on 10/23. Post-op with WBC elevation and concern for need for revision to AKA. The patient was resumed post-op on Heparin however VVS with no further plans for surgery, so pharmacy consulted to transition back to PTA Apixaban.   Given ESRD and age>80 kg - the patient qualifies for the lower dosing of 2.5 mg bid. Hgb 9.8 << 8.9, plts wnl  Goal of Therapy:  Appropriate anticoagulation for indication and hepatic/renal function    Plan:  - D/c Heparin - Restart Apixaban 2.5 mg bid  - Pharmacy will monitor peripherally for any signs of bleeding or needed dose adjustments  Thank you for allowing pharmacy to be a part of this patient's care.  Georgina Pillion, PharmD, BCPS Clinical Pharmacist Pager: [phone] Clinical phone for 03/03/2018 from 7a-3:30p: [phone] If  after 3:30p, please call main pharmacy at: [phone] Please check AMION for all [facility] Pharm[facility] numbers 03/03/2018 11:11 AM

## 2018-03-03 NOTE — Telephone Encounter (Signed)
sch appt spk to pt POA 03/31/18 315pm staple removal

## 2018-03-03 NOTE — Clinical Social Work Note (Signed)
Clinical Social Work Assessment  Patient Details  Name: [name] MRN: [id number] Date of Birth: February 11, 1933  Date of referral:  02/27/18               Reason for consult:  Facility Placement, Discharge Planning                Permission sought to share information with:  Family Supports Permission granted to share information::  Yes, Verbal Permission Granted  Name::     [name]  Agency::     Relationship::  Niece  Contact Information:  [phone]  Housing/Transportation Living arrangements for the past 2 months:  Apartment Source of Information:  Other (Comment Required)(Nice [name]) Patient Interpreter Needed:  None Criminal Activity/Legal Involvement Pertinent to Current Situation/Hospitalization:  No - Comment as needed Significant Relationships:  Other Family Members(Nieces and cousins per [name]) Lives with:  Adult Children(Patient will discharge home with her son and his wife) Do you feel safe going back to the place where you live?  Yes Need for family participation in patient care:  Yes (Comment)  Care giving concerns: Family would like patient to discharge to a skilled nursing facility but understands that she has used all of her 100 Medicare SNF days at [facility]. CSW contacted admissions director at [facility] and confirmed that her 100 th day was 02/26/18 and patient discharged from facility on 10/21 and went to her doctor's office.   Social Worker assessment / plan: CSW talked with patient and her niece [name] at the bedside. [name] was sitting up in bed and greeted CSW and initially was attentive to the conversation, but fell asleep and napped during most of CSW's visit.  CSW talked with niece regarding patient's stay at [facility], some of her concerns regarding patient's care, patient's current discharge plan since she has exhausted her Medicare SNF days and applying for Medicaid. CSW also talked with niece about the CAP and PACE programs.      Employment status:  Retired Health and safety inspector:  Medicare PT Recommendations:  Skilled Nursing Facility(OT saw 10/25 and recommended SNF) Information / Referral to community resources:  Other (Comment Required), Skilled Nursing Facility(Niece provided with SNF list and information on lodging a complaint against a skilled nursing facility. )  Patient/Family's Response to care:  [name] did not report any concerns regarding patient's care during hospitalization.  Patient/Family's Understanding of and Emotional Response to Diagnosis, Current Treatment, and Prognosis:  Niece expressed awareness that patient will need care at home and reported that family is coming together to assist with patient's care at her son's home.  Emotional Assessment Appearance:  Appears stated age Attitude/Demeanor/Rapport:  Other(Patient greeted CSW, but was asleep for most of the visit ) Affect (typically observed):  Quiet, Other(Patient napped througout CSW's visit) Orientation:  Oriented to Self, Oriented to Place, Oriented to Situation, Oriented to  Time Alcohol / Substance use:  Tobacco Use, Alcohol Use, Illicit Drugs(Patient quit smoking and does not drink alcohol or use illicit drugs) Psych involvement (Current and /or in the community):  No (Comment)  Discharge Needs  Concerns to be addressed:  Discharge Planning Concerns Readmission within the last 30 days:  No Current discharge risk:  None(Family will be caring for patient at her son's home) Barriers to Discharge:  Continued Medical Work up   [employer] Jamie Arms, LCSW 03/03/2018, 5:20 PM

## 2018-03-03 NOTE — Progress Notes (Addendum)
Nutrition Follow-up  DOCUMENTATION CODES:   Obesity unspecified  INTERVENTION:  -Continue Nepro Shake once daily, each supplement provides 425 kcal and 19 grams protein  -Continue Prostat liquid protein PO 30 ml BID with meals, each supplement provides 100 kcal, 15 grams protein.  -Continue Rena-Vit daily  -Feeding assistance needed  NUTRITION DIAGNOSIS:   Increased nutrient needs related to wound healing as evidenced by estimated needs. Ongoing  GOAL:   Patient will meet greater than or equal to 90% of their needs Progressing  MONITOR:   PO intake, Supplement acceptance, Labs, Skin, Weight trends, I & O's   ASSESSMENT:   84 yo female who presented to the ED from MD office with left heel wound. PMH right BKA, ESRD on HD TTS, GERD, gout, PAD, DM. admitted for gangrene. Pt s/p left BKA on 10/23.   Pt on TTS HD.  10/23 Pt s/p left BKA for gangrenous left heel.  Visited pt at bedside. Pt very lethargic and unable to provide information during assessment. However pt acknowledging with head nods. Spoke with RN who reports that she is taking the Pro-stat; confirmed by chart. Explained to pt the importance of continuing with the supplement in order to heal recent left BKA.  Per chart pt consuming Nepro supplements.  Per chart ate 50% breakfast this morning. Per RN niece was with her last night helping her eat though meal completion not recorded. Pt may benefit from feeding assistance if family not around to increase po intake.   Of note, need new established dry wt post left BKA. Phosphorus is 3.0, below recommended range 3.5-5.5.  Recommend reducing binders if stays low.    Meds: ss novolog, Renvela, senna, Rena-Vit, colace, aranesp, Sensipar, Hectorol Labs: CBGs 94-155, sodium 133, phosphorus 3.0    Diet Order:   Diet Order            Diet renal with fluid restriction Fluid restriction: 1200 mL Fluid; Room service appropriate? Yes; Fluid consistency: Thin  Diet  effective now              EDUCATION NEEDS:   Education needs have been addressed  Skin:  Skin Assessment: Skin Integrity Issues: Skin Integrity Issues:: Incisions Incisions: left BKA 10/23 Other: pressure injury (no stage noted) to coccyx  Last BM:  10/25  Height:   Ht Readings from Last 1 Encounters:  02/27/18 5\' 2"  (1.575 m)    Weight:   Wt Readings from Last 1 Encounters:  03/02/18 77.1 kg    Ideal Body Weight:  43.5 kg(adjusted for bilateral BKA)  BMI:  Body mass index is 31.09 kg/m.  Estimated Nutritional Needs:   Kcal:  1400-1600  Protein:  85-95  Fluid:  UOP + 1000 ml   Addalie Calles, Dietetic Intern [phone]

## 2018-03-03 NOTE — Progress Notes (Addendum)
[facility] Progress Note   Subjective:   Patient seen at bedside.  No new complaints.  States she is feeling ok. Denies SOB, CP, n/v/d and edema.   Objective Vitals:   03/02/18 1705 03/02/18 2116 03/03/18 0057 03/03/18 0514  BP: (!) 104/48 (!) 111/52 (!) 114/49 (!) 100/48  Pulse: 61 67 71 64  Resp: 18 20 18 18   Temp: 98.2 F (36.8 C) 98.3 F (36.8 C) 98 F (36.7 C) 98 F (36.7 C)  TempSrc: Oral Oral Oral   SpO2:  94% 100% 90%  Weight:  77.1 kg    Height:       Physical Exam General:NAD, chronically ill appearing female Heart:Regular rate, irregular rhythm Lungs:CTAB anteriorlaterally  Abdomen:soft, NTND Extremities:L BKA dressed, R BKA Dialysis Access: R IJ [facility]   Filed Weights   03/02/18 0830 03/02/18 1310 03/02/18 2116  Weight: 76.3 kg 73.7 kg 77.1 kg    Intake/Output Summary (Last 24 hours) at 03/03/2018 1043 Last data filed at 03/03/2018 0956 Gross per 24 hour  Intake 324.6 ml  Output 2028 ml  Net -1703.4 ml    Additional Objective Labs: Basic Metabolic Panel: Recent Labs  Lab 02/28/18 1409 03/01/18 0516 03/02/18 0920 03/03/18 0746  NA 136 134* 133* 133*  K 3.4* 3.4* 3.6 4.2  CL 97* 96* 95* 98  CO2 28 27 25 22   GLUCOSE 139* 130* 140* 136*  BUN 44* 15 31* 19  CREATININE 7.26* 4.17* 5.70* 3.62*  CALCIUM 8.2* 7.9* 8.2* 7.9*  PHOS 3.5  --  3.0  --    Liver Function Tests: Recent Labs  Lab 02/27/18 1336 02/28/18 1409 03/02/18 0920  AST 19  --   --   ALT 11  --   --   ALKPHOS 114  --   --   BILITOT 0.8  --   --   PROT 7.9  --   --   ALBUMIN 3.0* 2.2* 2.4*   CBC: Recent Labs  Lab 02/27/18 1336  02/28/18 1440 03/01/18 0516 03/02/18 0501 03/02/18 0807 03/02/18 1445 03/03/18 0746  WBC 17.9*   < > 22.3* 27.2* 49.9* 51.8*  --  42.6*  NEUTROABS 14.5*  --   --   --   --   --  41.3* 40.0*  HGB 10.8*   < > 10.4* 10.0* 9.4* 8.9*  --  9.8*  HCT 40.0   < > 36.7 35.4* 33.8* 32.7*  --  35.6*  MCV 79.4*   < > 78.6* 77.8* 79.2*  79.8*  --  79.6*  PLT 437*   < > 459* 364 422* 425*  --  370   < > = values in this interval not displayed.   Blood Culture    Component Value Date/Time   SDES BLOOD LEFT ANTECUBITAL 03/02/2018 1444   SPECREQUEST  03/02/2018 1444    BOTTLES DRAWN AEROBIC ONLY Blood Culture adequate volume   CULT  03/02/2018 1444    NO GROWTH < 24 HOURS Performed at [facility], [address], [place], [place] [address]    REPTSTATUS PENDING 03/02/2018 1444   CBG: Recent Labs  Lab 03/02/18 0731 03/02/18 1332 03/02/18 1635 03/02/18 2115 03/03/18 0748  GLUCAP 155* 94 109* 103* 142*   Studies/Results: No results found.  Medications: . heparin 1,200 Units/hr (03/03/18 0856)  . linezolid (ZYVOX) IV 600 mg (03/03/18 0401)  . piperacillin-tazobactam (ZOSYN)  IV 3.375 g (03/03/18 0526)   . amiodarone  200 mg Oral BID  .  Chlorhexidine Gluconate Cloth  6 each Topical Q0600  . cinacalcet  30 mg Oral Q supper  . [START ON 03/07/2018] darbepoetin (ARANESP) injection - DIALYSIS  60 mcg Intravenous Q Tue-HD  . digoxin  0.0625 mg Oral Daily  . docusate sodium  100 mg Oral BID  . doxercalciferol  1 mcg Intravenous Q T,Th,Sa-HD  . feeding supplement (NEPRO CARB STEADY)  237 mL Oral Q24H  . feeding supplement (PRO-STAT SUGAR FREE 64)  30 mL Oral BID  . gabapentin  100 mg Oral QHS  . insulin aspart  0-5 Units Subcutaneous QHS  . insulin aspart  0-9 Units Subcutaneous TID WC  . midodrine  10 mg Oral BID WC  . multivitamin  1 tablet Oral QHS  . senna-docusate  2 tablet Oral BID  . sevelamer carbonate  2,400 mg Oral TID WC    Dialysis Orders: [facility] T,Th,S 4 hrs 180NRe 400/autoflow 2.0 77 kg 2.0 K/2.25 Ca linear sodium, UFP 2 RIJ TDC -Heparin 5000 units IV TIW -Hectorol 1 mcg IV TIW -Mircera 100 mcg IV q 2 weeks (last dose 02/16/2018-last HGB 10.4 02/23/2018)  Assessment/Plan: 1. Gangrene L Heel - s/p L BKA 10/23 Dr. Randie Heinz. On Zyvox &  Zosyn.  2. ESRD - on HD TTS.  K 4.2, using  increased K bath. Orders written for HD tomorrow if remains admitted, per regular schedule.  3. Anemia of CKD- Hgb 9.8.  Aranesp qwk (tues) Follow trends.  4. Secondary hyperparathyroidism - CCa and phos in goal. Continue VDRA, binders, and sensipar.  5. Hypotension/volume - on midodrine.  Does not appear volume overloaded on exam.  Almost 2kg under EDW last HD.  Continue to titrate down volume as tolerated. Will need new EDW at d/c.   6. Nutrition - Alb 2.4. Renal diet w/fluid restrictions. Nepro, Renavite 7. DM - per primary 8. Hx A fib on eliquis.  Eliquis on hold. On Hep drip. Per primary 9. Rash - on atarax 10. Dispo - Ok to d/c from renal standpoint  Virgina Norfolk, PA-C [facility] Pager: [phone] 03/03/2018,10:43 AM  LOS: 4 days   I have seen and examined this patient and agree with plan and assessment in the above note with renal recommendations/intervention highlighted.  Feels well.  Bandages off.  Cont with HD on TTS schedule. Julien Nordmann Adisen Bennion,MD 03/03/2018 3:51 PM

## 2018-03-03 NOTE — Plan of Care (Signed)
  Problem: Education: Goal: Knowledge of General Education information will improve Description: Including pain rating scale, medication(s)/side effects and non-pharmacologic comfort measures Outcome: Progressing   Problem: Clinical Measurements: Goal: Ability to maintain clinical measurements within normal limits will improve Outcome: Progressing   Problem: Coping: Goal: Level of anxiety will decrease Outcome: Progressing   

## 2018-03-03 NOTE — NC FL2 (Deleted)
[place] MEDICAID FL2 LEVEL OF CARE SCREENING TOOL     IDENTIFICATION  Patient Name: [name] Birthdate: July 09, 1932 Sex: female Admission Date (Current Location): 02/27/2018  [facility] and [place] Number:  Producer, television/film/video and Address:  [facility] [place], [address], [place], [place] [address]      Provider Number: [id number]  Attending Physician Name and Address:  Cathren Harsh, MD  Relative Name and Phone Number:  Dannielle Burn [phone]    Current Level of Care: Hospital Recommended Level of Care: Skilled Nursing Facility Prior Approval Number:    Date Approved/Denied:   PASRR Number: [id number]  Discharge Plan: SNF    Current Diagnoses: Patient Active Problem List   Diagnosis Date Noted  . ESRD (end stage renal disease) on dialysis (HCC)   . Atrial fibrillation (HCC) 01/17/2018  . Gangrene (HCC)   . Pressure injury of skin 01/09/2018  . TIA (transient ischemic attack) 01/08/2018  . Unilateral complete BKA, right, initial encounter (HCC)   . Diabetes mellitus type 2 in obese (HCC)   . History of CVA (cerebrovascular accident)   . Post-operative pain   . Acute blood loss anemia   . Anemia of chronic disease   . Benign essential HTN   . Morbid obesity (HCC)   . Below knee amputation status, right 11/11/2017  . PAD (peripheral artery disease) (HCC) 04/29/2015  . Atherosclerosis of native arteries of the extremities with ulceration (HCC) 01/10/2014  . CVA (cerebral infarction) 12/14/2013  . Dysphagia, unspecified(787.20) 11/28/2013  . Diabetes mellitus with peripheral vascular disease (HCC) 10/31/2012  . Depression 10/31/2012  . Anemia 10/31/2012  . History of Clostridium difficile colitis 10/31/2012  . Chronic diarrhea 10/31/2012    Orientation RESPIRATION BLADDER Height & Weight     Self, Time, Place  Normal Continent Weight: 169 lb 15.6 oz (77.1 kg) Height:  5\' 2"  (157.5 cm)  BEHAVIORAL SYMPTOMS/MOOD  NEUROLOGICAL BOWEL NUTRITION STATUS      Continent Diet(Renal diet; Fluid restriction )  AMBULATORY STATUS COMMUNICATION OF NEEDS Skin    Max assist Verbally Other (Comment)(Moisture Associated Skin Damage: Left and Right Buttocks (Cleansed and Barrier Cream) ; Rash: Chest and Neck ; Pressure Injury: Left Heel (Unstageable), Coccyx (Moisture Barrier))                       Personal Care Assistance Level of Assistance  Bathing, Feeding, Dressing           Functional Limitations Info  Sight, Hearing, Speech          SPECIAL CARE FACTORS FREQUENCY  PT (By licensed PT), OT (By licensed OT)                    Contractures Contractures Info: Not present    Additional Factors Info  Code Status, Allergies, Insulin Sliding Scale Code Status Info: Full Code Allergies Info: Codeine, Vanomycin   Insulin Sliding Scale Info: Insulin aspart: 0-9 units 3x daily with meals, Insulin aspart: 0-5 units daily at bedtime       Current Medications (03/03/2018):  This is the current hospital active medication list Current Facility-Administered Medications  Medication Dose Route Frequency Provider Last Rate Last Dose  . acetaminophen (TYLENOL) tablet 650 mg  650 mg Oral Q6H PRN Emilie Rutter, PA-C       Or  . acetaminophen (TYLENOL) suppository 650 mg  650 mg Rectal Q6H PRN Emilie Rutter, PA-C      .  amiodarone (PACERONE) tablet 200 mg  200 mg Oral BID Emilie Rutter, PA-C   200 mg at 03/03/18 0847  . apixaban (ELIQUIS) tablet 2.5 mg  2.5 mg Oral BID Rai, Ripudeep K, MD   2.5 mg at 03/03/18 1122  . bisacodyl (DULCOLAX) suppository 10 mg  10 mg Rectal PRN Emilie Rutter, PA-C      . calcium carbonate (dosed in mg elemental calcium) suspension 500 mg of elemental calcium  500 mg of elemental calcium Oral Q6H PRN Emilie Rutter, PA-C      . camphor-menthol [facility]) lotion 1 application  1 application Topical Q8H PRN Emilie Rutter, PA-C       And  . hydrOXYzine  (ATARAX/VISTARIL) tablet 25 mg  25 mg Oral Q8H PRN Emilie Rutter, PA-C   25 mg at 02/28/18 2222  . Chlorhexidine Gluconate Cloth 2 % PADS 6 each  6 each Topical Q0600 Emilie Rutter, PA-C   6 each at 03/02/18 0630  . cinacalcet (SENSIPAR) tablet 30 mg  30 mg Oral Q supper Pola Corn, NP   30 mg at 03/02/18 1720  . [START ON 03/07/2018] Darbepoetin Alfa (ARANESP) injection 60 mcg  60 mcg Intravenous Q Tue-HD Emilie Rutter, PA-C   60 mcg at 02/28/18 1708  . digoxin (LANOXIN) tablet 0.0625 mg  0.0625 mg Oral Daily Emilie Rutter, PA-C   0.0625 mg at 03/03/18 0847  . docusate sodium (COLACE) capsule 100 mg  100 mg Oral BID Emilie Rutter, PA-C   100 mg at 03/03/18 0847  . docusate sodium (ENEMEEZ) enema 283 mg  1 enema Rectal PRN Emilie Rutter, PA-C      . doxercalciferol (HECTOROL) injection 1 mcg  1 mcg Intravenous Q T,Th,Sa-HD Emilie Rutter, PA-C   1 mcg at 03/02/18 1006  . feeding supplement (NEPRO CARB STEADY) liquid 237 mL  237 mL Oral Q24H Emilie Rutter, PA-C   237 mL at 03/03/18 1123  . feeding supplement (PRO-STAT SUGAR FREE 64) liquid 30 mL  30 mL Oral BID Emilie Rutter, PA-C   30 mL at 03/03/18 0847  . gabapentin (NEURONTIN) capsule 100 mg  100 mg Oral QHS Emilie Rutter, PA-C   100 mg at 03/02/18 2118  . insulin aspart (novoLOG) injection 0-5 Units  0-5 Units Subcutaneous QHS Eveland, Matthew, PA-C      . insulin aspart (novoLOG) injection 0-9 Units  0-9 Units Subcutaneous TID WC Emilie Rutter, PA-C   2 Units at 03/01/18 1644  . linezolid (ZYVOX) IVPB 600 mg  600 mg Intravenous Q12H Rai, Ripudeep K, MD 300 mL/hr at 03/03/18 0401 600 mg at 03/03/18 0401  . midodrine (PROAMATINE) tablet 10 mg  10 mg Oral BID WC Emilie Rutter, PA-C   10 mg at 03/03/18 1610  . multivitamin (RENA-VIT) tablet 1 tablet  1 tablet Oral QHS Pola Corn, NP   1 tablet at 03/02/18 2117  . ondansetron (ZOFRAN) tablet 4 mg  4 mg Oral Q6H PRN Emilie Rutter, PA-C        Or  . ondansetron (ZOFRAN) injection 4 mg  4 mg Intravenous Q6H PRN Emilie Rutter, PA-C      . oxyCODONE-acetaminophen (PERCOCET/ROXICET) 5-325 MG per tablet 1-2 tablet  1-2 tablet Oral Q4H PRN Emilie Rutter, PA-C   2 tablet at 03/03/18 0847  . piperacillin-tazobactam (ZOSYN) IVPB 3.375 g  3.375 g Intravenous Q12H Rai, Ripudeep K, MD 12.5 mL/hr at 03/03/18 0526 3.375 g at 03/03/18 0526  . polyethylene glycol (MIRALAX / GLYCOLAX) packet 17 g  17 g Oral Daily PRN Emilie Rutter, PA-C      . senna-docusate (Senokot-S) tablet 2 tablet  2 tablet Oral BID Emilie Rutter, PA-C   2 tablet at 03/03/18 0847  . sevelamer carbonate (RENVELA) tablet 2,400 mg  2,400 mg Oral TID WC Emilie Rutter, PA-C   2,400 mg at 03/03/18 1122  . sodium phosphate (FLEET) 7-19 GM/118ML enema 1 enema  1 enema Rectal Once PRN Emilie Rutter, PA-C      . sorbitol 70 % solution 30 mL  30 mL Oral PRN Emilie Rutter, PA-C      . zolpidem (AMBIEN) tablet 5 mg  5 mg Oral QHS PRN Emilie Rutter, PA-C         Discharge Medications: Please see discharge summary for a list of discharge medications.  Relevant Imaging Results:  Relevant Lab Results:   Additional Information: [id number]. Dialysis patient - MWF at [facility]. Right BKA July 2019. As of 10/25, patient not evaluated by PT.    Cristobal Goldmann, LCSW

## 2018-03-03 NOTE — Telephone Encounter (Signed)
-----   Message from Maeola Harman, MD sent at 03/03/2018  8:10 AM EDT ----- [name] [phone]   F/u in 3-4 weeks with me/np/pa for staple removal  bc

## 2018-03-03 NOTE — Progress Notes (Signed)
PT Cancellation Note  Patient Details Name: [name] MRN: [id number] DOB: 1932/10/31   Cancelled Treatment:    Reason Eval/Treat Not Completed: Patient declined, no reason specified.  Attempted PT eval, pt crying out "I can't do that, I'm not going to do anything".  PT attempted to educate pt on importance of mobility and out of bed activity, pt continues to refuse, nursing aware.   Mckayla Mulcahey 03/03/2018, 8:51 AM

## 2018-03-03 NOTE — Care Management Important Message (Signed)
Important Message  Patient Details  Name: [name] MRN: [id number] Date of Birth: 05/30/1932   Medicare Important Message Given:  Yes    Zelena Bushong 03/03/2018, 2:51 PM

## 2018-03-03 NOTE — Progress Notes (Signed)
ANTICOAGULATION CONSULT NOTE - Follow Up Consult  Pharmacy Consult for Heparin Indication: atrial fibrillation  Patient Measurements: Height: 5\' 2"  (157.5 cm) Weight: 169 lb 15.6 oz (77.1 kg) IBW/kg (Calculated) : 50.1 Heparin Dosing Weight: 67.9 kg  Vital Signs: Temp: 98 F (36.7 C) (10/25 0057) Temp Source: Oral (10/25 0057) BP: 114/49 (10/25 0057) Pulse Rate: 71 (10/25 0057)  Labs: Recent Labs    02/28/18 0545 02/28/18 1409  03/01/18 0516 03/02/18 0501 03/02/18 0807 03/02/18 0920 03/02/18 2238  HGB 10.5*  --    < > 10.0* 9.4* 8.9*  --   --   HCT 37.2  --    < > 35.4* 33.8* 32.7*  --   --   PLT 458*  --    < > 364 422* 425*  --   --   APTT 37*  --   --  92*  --   --   --  37*  HEPARINUNFRC >2.20*  --   --  0.98*  --   --   --   --   CREATININE 7.02* 7.26*  --  4.17*  --   --  5.70*  --    < > = values in this interval not displayed.    Estimated Creatinine Clearance: 7.2 mL/min (A) (by C-G formula based on SCr of 5.7 mg/dL (H)).   Medications:  Infusions:  . heparin 1,100 Units/hr (03/02/18 1337)  . linezolid (ZYVOX) IV 600 mg (03/02/18 1719)  . piperacillin-tazobactam (ZOSYN)  IV 3.375 g (03/02/18 1720)    Assessment: 85 YOF on apixaban PTA for hx Afib. Held and bridged with Heparin due to need for BKA - done on 10/23. Post-op with WBC elevation and concern for need for revision to AKA. Pharmacy consulted to resume Heparin drip for now.  The patient was previously therapeutic on a rate of 1100 units/hr (11 ml/hr). Heparin levels are falsely elevated with recent apixaban use - will use aPTTs for monitoring.   10/25 AM update: aPTT is low this AM, no issues per RN  Goal of Therapy:  Heparin level 0.3-0.7 units/ml aPTT 66-102 seconds Monitor platelets by anticoagulation protocol: Yes   Plan:  - Inc heparin to 1200 units/hr - Re-check aPTT and heparin level in 8 hours - Will continue to monitor for any signs/symptoms of bleeding   Abran Duke, PharmD,  BCPS Clinical Pharmacist Phone: [phone]

## 2018-03-03 NOTE — Progress Notes (Signed)
  Progress Note    03/03/2018 8:10 AM 2 Days Post-Op  Subjective: Feeling okay this morning  Vitals:   03/03/18 0057 03/03/18 0514  BP: (!) 114/49 (!) 100/48  Pulse: 71 64  Resp: 18 18  Temp: 98 F (36.7 C) 98 F (36.7 C)  SpO2: 100% 90%    Physical Exam: Awake and alert Left below-knee residual limb with staples intact and minimal drainage  CBC    Component Value Date/Time   WBC 51.8 (HH) 03/02/2018 0807   RBC 4.10 03/02/2018 0807   HGB 8.9 (L) 03/02/2018 0807   HGB 12.8 06/03/2014 1249   HCT 32.7 (L) 03/02/2018 0807   HCT 43.0 06/03/2014 1249   PLT 425 (H) 03/02/2018 0807   PLT 350 06/03/2014 1249   MCV 79.8 (L) 03/02/2018 0807   MCV 76.6 (L) 06/03/2014 1249   MCH 21.7 (L) 03/02/2018 0807   MCHC 27.2 (L) 03/02/2018 0807   RDW 19.7 (H) 03/02/2018 0807   RDW 19.8 (H) 06/03/2014 1249   LYMPHSABS 0.0 (L) 03/02/2018 1445   LYMPHSABS 1.6 06/03/2014 1249   MONOABS 1.3 (H) 03/02/2018 1445   MONOABS 0.7 06/03/2014 1249   EOSABS 1.3 (H) 03/02/2018 1445   EOSABS 0.1 06/03/2014 1249   BASOSABS 0.0 03/02/2018 1445   BASOSABS 0.1 06/03/2014 1249    BMET    Component Value Date/Time   NA 133 (L) 03/02/2018 0920   NA 142 06/03/2014 1249   K 3.6 03/02/2018 0920   K 5.3 (H) 06/03/2014 1249   CL 95 (L) 03/02/2018 0920   CO2 25 03/02/2018 0920   CO2 25 06/03/2014 1249   GLUCOSE 140 (H) 03/02/2018 0920   GLUCOSE 107 06/03/2014 1249   BUN 31 (H) 03/02/2018 0920   BUN 75.0 (H) 06/03/2014 1249   CREATININE 5.70 (H) 03/02/2018 0920   CREATININE 12.5 Repeated and Verified (HH) 06/03/2014 1249   CALCIUM 8.2 (L) 03/02/2018 0920   CALCIUM 9.5 06/03/2014 1249   GFRNONAA 6 (L) 03/02/2018 0920   GFRAA 7 (L) 03/02/2018 0920    INR    Component Value Date/Time   INR 1.09 01/08/2018 0854     Intake/Output Summary (Last 24 hours) at 03/03/2018 0810 Last data filed at 03/03/2018 0203 Gross per 24 hour  Intake 124.6 ml  Output 2028 ml  Net -1903.4 ml      Assessment:  84 y.o. female is s/p left below-knee amputation for gangrenous left heel  Plan: Okay for discharge from vascular standpoint Please call over the weekend if there are further questions I will set follow-up in 3 to 4 weeks in our office for staple removal.  Baileigh Modisette C. Randie Heinz, MD [facility] Office: [phone] Pager: [phone]  03/03/2018 8:10 AM

## 2018-03-03 NOTE — Discharge Instructions (Signed)

## 2018-03-04 LAB — BASIC METABOLIC PANEL
Anion gap: 14 (ref 5–15)
BUN: 29 mg/dL — AB (ref 8–23)
CHLORIDE: 95 mmol/L — AB (ref 98–111)
CO2: 25 mmol/L (ref 22–32)
CREATININE: 4.88 mg/dL — AB (ref 0.44–1.00)
Calcium: 8 mg/dL — ABNORMAL LOW (ref 8.9–10.3)
GFR calc Af Amer: 9 mL/min — ABNORMAL LOW (ref >60)
GFR calc non Af Amer: 7 mL/min — ABNORMAL LOW (ref >60)
Glucose, Bld: 169 mg/dL — ABNORMAL HIGH (ref 70–99)
POTASSIUM: 3.9 mmol/L (ref 3.5–5.1)
Sodium: 134 mmol/L — ABNORMAL LOW (ref 135–145)

## 2018-03-04 LAB — BPAM RBC
BLOOD PRODUCT EXPIRATION DATE: 201911212359
BLOOD PRODUCT EXPIRATION DATE: 201911212359
ISSUE DATE / TIME: 201910201434
ISSUE DATE / TIME: 201910201434
UNIT TYPE AND RH: 5100
Unit Type and Rh: 5100

## 2018-03-04 LAB — GLUCOSE, CAPILLARY
GLUCOSE-CAPILLARY: 117 mg/dL — AB (ref 70–99)
GLUCOSE-CAPILLARY: 109 mg/dL — AB (ref 70–99)
Glucose-Capillary: 131 mg/dL — ABNORMAL HIGH (ref 70–99)
Glucose-Capillary: 156 mg/dL — ABNORMAL HIGH (ref 70–99)

## 2018-03-04 LAB — TYPE AND SCREEN
ABO/RH(D): O POS
Antibody Screen: POSITIVE
DAT, IgG: POSITIVE
Unit division: 0
Unit division: 0

## 2018-03-04 LAB — DIFFERENTIAL
BASOS ABS: 0.1 10*3/uL (ref 0.0–0.1)
Basophils Relative: 0 %
EOS ABS: 1.3 10*3/uL — AB (ref 0.0–0.5)
EOS PCT: 2 %
Lymphocytes Relative: 3 %
Lymphs Abs: 1.6 10*3/uL (ref 0.7–4.0)
MONO ABS: 1.1 10*3/uL — AB (ref 0.1–1.0)
Monocytes Relative: 2 %
NEUTROS PCT: 90 %
Neutro Abs: 47.7 10*3/uL — ABNORMAL HIGH (ref 1.7–7.7)

## 2018-03-04 LAB — CULTURE, BLOOD (ROUTINE X 2)
Culture: NO GROWTH
Culture: NO GROWTH
SPECIAL REQUESTS: ADEQUATE
Special Requests: ADEQUATE

## 2018-03-04 LAB — CBC
HEMATOCRIT: 32.8 % — AB (ref 36.0–46.0)
Hemoglobin: 9.3 g/dL — ABNORMAL LOW (ref 12.0–15.0)
MCH: 22.6 pg — ABNORMAL LOW (ref 26.0–34.0)
MCHC: 28.4 g/dL — AB (ref 30.0–36.0)
MCV: 79.8 fL — AB (ref 80.0–100.0)
NRBC: 0.1 % (ref 0.0–0.2)
Platelets: 427 10*3/uL — ABNORMAL HIGH (ref 150–400)
RBC: 4.11 MIL/uL (ref 3.87–5.11)
RDW: 19.9 % — AB (ref 11.5–15.5)
WBC: 52.8 10*3/uL (ref 4.0–10.5)

## 2018-03-04 MED ORDER — LIDOCAINE HCL (PF) 1 % IJ SOLN: 5.0000 mL | INTRAMUSCULAR | Status: DC | PRN

## 2018-03-04 MED ORDER — HEPARIN SODIUM (PORCINE) 1000 UNIT/ML DIALYSIS
1000.0000 [IU] | INTRAMUSCULAR | Status: DC | PRN
  Administered 2018-03-04: 3200 [IU] via INTRAVENOUS_CENTRAL

## 2018-03-04 MED ORDER — HEPARIN SODIUM (PORCINE) 1000 UNIT/ML IJ SOLN
INTRAMUSCULAR | Status: AC
  Administered 2018-03-04: 3200 [IU] via INTRAVENOUS_CENTRAL
  Filled 2018-03-04: qty 4

## 2018-03-04 MED ORDER — SODIUM CHLORIDE 0.9 % IV SOLN: 100.0000 mL | INTRAVENOUS | Status: DC | PRN

## 2018-03-04 MED ORDER — OXYCODONE-ACETAMINOPHEN 5-325 MG PO TABS
ORAL_TABLET | ORAL | Status: AC
  Filled 2018-03-04: qty 1

## 2018-03-04 MED ORDER — ALTEPLASE 2 MG IJ SOLR: 2.0000 mg | Freq: Once | INTRAMUSCULAR | Status: DC | PRN

## 2018-03-04 MED ORDER — PENTAFLUOROPROP-TETRAFLUOROETH EX AERO: 1.0000 | INHALATION_SPRAY | CUTANEOUS | Status: DC | PRN

## 2018-03-04 MED ORDER — LIDOCAINE-PRILOCAINE 2.5-2.5 % EX CREA: 1.0000 "application " | TOPICAL_CREAM | CUTANEOUS | Status: DC | PRN

## 2018-03-04 MED ORDER — MIDODRINE HCL 5 MG PO TABS
ORAL_TABLET | ORAL | Status: AC
  Filled 2018-03-04: qty 2

## 2018-03-04 MED ORDER — DOXERCALCIFEROL 4 MCG/2ML IV SOLN
INTRAVENOUS | Status: AC
  Administered 2018-03-04: 1 ug via INTRAVENOUS
  Filled 2018-03-04: qty 2

## 2018-03-04 NOTE — Progress Notes (Signed)
PT Cancellation Note  Patient Details Name: [name] MRN: [id number] DOB: [birth year]   Cancelled Treatment:    Reason Eval/Treat Not Completed: Patient at procedure or test/unavailable Pt off floor at HD. Will follow up as time allows.   Blake Divine A Mairim Bade 03/04/2018, 9:13 AM Mylo Red, PT, DPT Acute Rehabilitation Services Pager [phone] Office [phone]

## 2018-03-04 NOTE — Evaluation (Signed)
Physical Therapy Evaluation Patient Details Name: [name] MRN: [id number] DOB: [birth year] Today's Date: 03/04/2018   History of Present Illness  This 84 y.o. female admitted from SNF presents with wound infection Lt foot heel with ongoing leukocytosis.  SHe underwent Lt BKA 03/01/18.  PMH includes s/p Rt BKA, CVA, PAD, s/p balloon angioplasty, DM, ESRD with HD   Clinical Impression  Patient presents with pain, decreased AROM of LLE, weakness and impaired mobility s/p above. Tolerated rolling with Mod A and long sitting to activate core/trunk musculature. Education re: positioning of left residual limb, exercises and importance of pressure relief. Pt with sores on buttocks. Per chart, pt with no more SNF days so will be discharging home with support of family. Recommend hospital bed, w/c for bil amputee (with tippers), geomat and possible hoyer lift. Also recommend maximizing HH services- HHPT, OT, aide. Will follow acute to maximize independence, mobility, perform family training and ease burden of care prior to return home.    Follow Up Recommendations Home health PT;Supervision for mobility/OOB    Equipment Recommendations  Wheelchair (measurements PT);Wheelchair cushion (measurements PT);Hospital bed;Other (comment)(potentially hoyer lift)    Recommendations for Other Services       Precautions / Restrictions Precautions Precautions: Fall Precaution Comments: bil. BKA  Restrictions Weight Bearing Restrictions: No      Mobility  Bed Mobility Overal bed mobility: Needs Assistance Bed Mobility: Rolling Rolling: Mod assist         General bed mobility comments: Able to roll to left/right x2 with assist to reach for rail; easier rolling towards right.  Transfers                 General transfer comment: Deferred due to safety.  Ambulation/Gait                Stairs            Wheelchair Mobility    Modified Rankin (Stroke Patients Only)        Balance                                             Pertinent Vitals/Pain Pain Assessment: Faces Faces Pain Scale: Hurts even more Pain Location: Lt BKA with activity  Pain Descriptors / Indicators: Grimacing;Operative site guarding Pain Intervention(s): Monitored during session;Repositioned;Limited activity within patient's tolerance    Home Living Family/patient expects to be discharged to:: Private residence Living Arrangements: Children(son and daughter in Social worker) Available Help at Discharge: Family;Available 24 hours/day Type of Home: House Home Access: Level entry     Home Layout: One level Home Equipment: Walker - 2 wheels;Wheelchair - manual;Cane - single point;Bedside commode      Prior Function Level of Independence: Needs assistance   Gait / Transfers Assistance Needed: Reports she was doing sliding board transfer to w/c at SNF. Got her prosthesis and was starting to wear it for tolerance.            Hand Dominance   Dominant Hand: Right    Extremity/Trunk Assessment   Upper Extremity Assessment Upper Extremity Assessment: Defer to OT evaluation    Lower Extremity Assessment Lower Extremity Assessment: RLE deficits/detail;LLE deficits/detail RLE Deficits / Details: BKA- able to lift off bed LLE Deficits / Details: BKA; limited knee extension due to pain/tightness. LLE: Unable to fully assess due to pain    Cervical / Trunk  Assessment Cervical / Trunk Assessment: Other exceptions Cervical / Trunk Exceptions: trunk weakness  Communication   Communication: No difficulties  Cognition Arousal/Alertness: Lethargic Behavior During Therapy: WFL for tasks assessed/performed Overall Cognitive Status: No family/caregiver present to determine baseline cognitive functioning                                 General Comments: Eyes closed for most of session but pt answering questions appropriately sometimes needing repetition.  Slow to respond at times.      General Comments      Exercises Other Exercises Other Exercises: Came into long sitting x5 with assist of 2 and slow descent to activate core   Assessment/Plan    PT Assessment Patient needs continued PT services  PT Problem List Decreased strength;Decreased mobility;Obesity;Decreased knowledge of precautions;Decreased range of motion;Decreased activity tolerance;Decreased skin integrity;Pain;Decreased balance       PT Treatment Interventions DME instruction;Functional mobility training;Balance training;Patient/family education;Therapeutic activities;Wheelchair mobility training;Therapeutic exercise    PT Goals (Current goals can be found in the Care Plan section)  Acute Rehab PT Goals Patient Stated Goal: I want to walk again  PT Goal Formulation: With patient Time For Goal Achievement: 03/18/18 Potential to Achieve Goals: Fair    Frequency Min 3X/week   Barriers to discharge        Co-evaluation PT/OT/SLP Co-Evaluation/Treatment: Yes Reason for Co-Treatment: For patient/therapist safety;To address functional/ADL transfers PT goals addressed during session: Mobility/safety with mobility;Balance         AM-PAC PT "6 Clicks" Daily Activity  Outcome Measure Difficulty turning over in bed (including adjusting bedclothes, sheets and blankets)?: Unable Difficulty moving from lying on back to sitting on the side of the bed? : Unable Difficulty sitting down on and standing up from a chair with arms (e.g., wheelchair, bedside commode, etc,.)?: Unable Help needed moving to and from a bed to chair (including a wheelchair)?: Total Help needed walking in hospital room?: Total Help needed climbing 3-5 steps with a railing? : Total 6 Click Score: 6    End of Session   Activity Tolerance: Patient tolerated treatment well Patient left: in bed;with call bell/phone within reach;with bed alarm set Nurse Communication: Mobility status;Need for lift  equipment PT Visit Diagnosis: Muscle weakness (generalized) (M62.81);Pain Pain - Right/Left: Left Pain - part of body: Leg    Time: 1610-9604 PT Time Calculation (min) (ACUTE ONLY): 25 min   Charges:   PT Evaluation $PT Eval Moderate Complexity: 1 Mod          Mylo Red, PT, DPT Acute Rehabilitation Services Pager [phone] Office [phone]      Blake Divine A Lanier Ensign 03/04/2018, 3:53 PM

## 2018-03-04 NOTE — Progress Notes (Signed)
[facility]                                                                              Patient Demographics  [name], is a 84 y.o. female, DOB - Dec 28, 1932, RUE:[id number]  Admit date - 02/27/2018   Admitting Physician Jonah Blue, MD  Outpatient Primary MD for the patient is Mila Palmer, MD  Outpatient specialists:   LOS - 5  days   Medical records reviewed and are as summarized below:    Chief Complaint  Patient presents with  . Wound Infection       Brief summary  84 y.o.femalewith medical history significant ofCVA; PAD s/p balloon angioplasty in 2/19 and 9/19 and R BKA in 7/19; obesity (BMI 32); hyperparathyroidism; DM; and ESRD on HD presenting with wound infection in the left foot heel.  Patient presented from skilled nursing facility.  Assessment & Plan    Principal Problem:   Gangrene (HCC) of left foot/heel, leukocytosis -Prior history of right BKA, patient had undergone angiography and angioplasty of the left peroneal artery in 01/2018 -Patient presented with gangrenous changes of the left heel area with toe. -Continue IV Zyvox, Zosyn  -Patient underwent left BKA on 10/23 for the gangrene, stable from VS standpoint -White count trended back up again to 52.8 from 42.6 yesterday. -Repeat blood cultures still negative, mild abdominal pain with one loose greenish BM today and HD however patient on scheduled stool softeners, discontinued. -Obtain CT abdomen and pelvis for further work-up, if patient has 2 additional BMs today, will rule out C. Difficile. -ID consult called -Left BKA wound inspected, looks clean, no drainage.  Appreciate vascular surgery, Dr. Chestine Spore also evaluated. -Evaluated the back, patient has stage II sacral pressure sores but no significant drainage or inflammation noticed.   Active Problems:   Diabetes mellitus type II, with peripheral vascular disease (HCC) -CBG stable, continue sliding scale insulin  ESRD  on hemodialysis -HD TTS, nephrology following    Anemia of chronic disease -H&H stable    Benign essential HTN -BP stable  History of atrial fibrillation on Eliquis Continue Eliquis  Stage 2 decubiti to buttock and sacral areas  - wound care per nursing    Code Status: Full CODE STATUS DVT Prophylaxis: Restarting Eliquis Family Communication: Discussed in detail with the patient, all imaging results, lab results explained to the patient   Disposition Plan:   Time Spent in minutes 25 minutes  Procedures:  Left BKA 10/23 Hemodialysis  Consultants:   Vascular surgery Nephrology Infectious disease, Dr. Ilsa Iha  Antimicrobials:      Medications  Scheduled Meds: . amiodarone  200 mg Oral BID  . apixaban  2.5 mg Oral BID  . Chlorhexidine Gluconate Cloth  6 each Topical Q0600  . cinacalcet  30 mg Oral Q supper  . [START ON 03/07/2018] darbepoetin (ARANESP) injection - DIALYSIS  60 mcg Intravenous Q Tue-HD  . digoxin  0.0625 mg Oral Daily  . docusate sodium  100 mg Oral BID  . doxercalciferol  1 mcg Intravenous Q T,Th,Sa-HD  . feeding supplement (NEPRO CARB STEADY)  237 mL Oral Q24H  . feeding supplement (PRO-STAT  SUGAR FREE 64)  30 mL Oral BID  . gabapentin  100 mg Oral QHS  . insulin aspart  0-5 Units Subcutaneous QHS  . insulin aspart  0-9 Units Subcutaneous TID WC  . midodrine  10 mg Oral BID WC  . multivitamin  1 tablet Oral QHS  . sevelamer carbonate  2,400 mg Oral TID WC   Continuous Infusions: . linezolid (ZYVOX) IV 600 mg (03/04/18 0451)  . piperacillin-tazobactam (ZOSYN)  IV 3.375 g (03/04/18 1201)   PRN Meds:.acetaminophen **OR** acetaminophen, bisacodyl, calcium carbonate (dosed in mg elemental calcium), camphor-menthol **AND** hydrOXYzine, docusate sodium, ondansetron **OR** ondansetron (ZOFRAN) IV, oxyCODONE-acetaminophen, sodium phosphate, sorbitol, zolpidem   Antibiotics   Anti-infectives (From admission, onward)   Start     Dose/Rate Route  Frequency Ordered Stop   03/02/18 1600  piperacillin-tazobactam (ZOSYN) IVPB 3.375 g     3.375 g 12.5 mL/hr over 240 Minutes Intravenous Every 12 hours 03/02/18 0947     03/02/18 1600  linezolid (ZYVOX) IVPB 600 mg  Status:  Discontinued     600 mg 300 mL/hr over 60 Minutes Intravenous Every 12 hours 03/02/18 0947 03/02/18 1315   03/02/18 1600  linezolid (ZYVOX) IVPB 600 mg     600 mg 300 mL/hr over 60 Minutes Intravenous Every 12 hours 03/02/18 1315     02/27/18 1600  piperacillin-tazobactam (ZOSYN) IVPB 2.25 g  Status:  Discontinued     2.25 g 100 mL/hr over 30 Minutes Intravenous Every 8 hours 02/27/18 1527 02/27/18 1544   02/27/18 1600  linezolid (ZYVOX) IVPB 600 mg  Status:  Discontinued     600 mg 300 mL/hr over 60 Minutes Intravenous Every 12 hours 02/27/18 1537 03/02/18 0947   02/27/18 1600  piperacillin-tazobactam (ZOSYN) IVPB 3.375 g  Status:  Discontinued     3.375 g 12.5 mL/hr over 240 Minutes Intravenous Every 12 hours 02/27/18 1544 03/02/18 0947        Subjective:   [name] was seen and examined today.  Complaining of mild abdominal pain and had greenish loose watery bowel movement during HD.  No fevers or chills.  No chest pain or shortness of breath.  No coughing.    Objective:   Vitals:   03/04/18 1000 03/04/18 1030 03/04/18 1048 03/04/18 1100  BP: (!) 116/41 (!) 100/46 (!) 86/30 (!) 103/54  Pulse: 84 83 80 97  Resp:    18  Temp:    98.6 F (37 C)  TempSrc:    Oral  SpO2:      Weight:    75.3 kg  Height:        Intake/Output Summary (Last 24 hours) at 03/04/2018 1427 Last data filed at 03/04/2018 1100 Gross per 24 hour  Intake 710 ml  Output 700 ml  Net 10 ml     Wt Readings from Last 3 Encounters:  03/04/18 75.3 kg  02/27/18 76.9 kg  02/24/18 76.9 kg      Exam   General: Alert and oriented, flat affect, nad   Eyes:   HEENT:    Cardiovascular: S1 S2 auscultated, Regular rate and rhythm. No pedal edema b/l  Respiratory: Clear  to auscultation bilaterally, no wheezing, rales or rhonchi  Gastrointestinal: Soft, nontender, nondistended, + bowel sounds  Ext: left BKA, staples intact, no drainage, looks clean.. Old right BKA stump clean.  Neuro: no new FND  Musculoskeletal: No digital cyanosis, clubbing  Skin: Back with stage II pressure sores, no erythema inflammation or drainage  Psych: flat affect  Data Reviewed:  I have personally reviewed following labs and imaging studies  Micro Results Recent Results (from the past 240 hour(s))  Blood Cultures x 2 sites     Status: None   Collection Time: 02/27/18  5:15 PM  Result Value Ref Range Status   Specimen Description BLOOD RIGHT ANTECUBITAL  Final   Special Requests   Final    BOTTLES DRAWN AEROBIC AND ANAEROBIC Blood Culture adequate volume   Culture   Final    NO GROWTH 5 DAYS Performed at [facility], [address], [place], [place] [address]    Report Status 03/04/2018 FINAL  Final  Blood Cultures x 2 sites     Status: None   Collection Time: 02/27/18  7:10 PM  Result Value Ref Range Status   Specimen Description BLOOD RIGHT FOREARM  Final   Special Requests   Final    BOTTLES DRAWN AEROBIC ONLY Blood Culture adequate volume   Culture   Final    NO GROWTH 5 DAYS Performed at [facility], [address] [place], [place] [address]    Report Status 03/04/2018 FINAL  Final  MRSA PCR Screening     Status: None   Collection Time: 02/27/18 11:04 PM  Result Value Ref Range Status   MRSA by PCR NEGATIVE NEGATIVE Final    Comment:        The GeneXpert MRSA Assay (FDA approved for NASAL specimens only), is one component of a comprehensive MRSA colonization surveillance program. It is not intended to diagnose MRSA infection nor to guide or monitor treatment for MRSA infections. Performed at [facility], [address], [place], [place] [address]   Culture, blood (routine x 2)     Status: None (Preliminary result)    Collection Time: 03/02/18  2:30 PM  Result Value Ref Range Status   Specimen Description BLOOD LEFT ANTECUBITAL  Final   Special Requests   Final    BOTTLES DRAWN AEROBIC ONLY Blood Culture adequate volume   Culture   Final    NO GROWTH 2 DAYS Performed at [facility], [address] [place], [place] [address]    Report Status PENDING  Incomplete  Culture, blood (routine x 2)     Status: None (Preliminary result)   Collection Time: 03/02/18  2:44 PM  Result Value Ref Range Status   Specimen Description BLOOD LEFT ANTECUBITAL  Final   Special Requests   Final    BOTTLES DRAWN AEROBIC ONLY Blood Culture adequate volume   Culture   Final    NO GROWTH 2 DAYS Performed at [facility], [address], [place], [place] [address]    Report Status PENDING  Incomplete    Radiology Reports Dg Foot 2 Views Left  Result Date: 02/27/2018 Please see detailed radiograph report in office note.   Lab Data:  CBC: Recent Labs  Lab 02/27/18 1336  03/01/18 0516 03/02/18 0501 03/02/18 0807 03/02/18 1445 03/03/18 0746 03/04/18 0727  WBC 17.9*   < > 27.2* 49.9* 51.8*  --  42.6* 52.8*  NEUTROABS 14.5*  --   --   --   --  41.3* 40.0* 47.7*  HGB 10.8*   < > 10.0* 9.4* 8.9*  --  9.8* 9.3*  HCT 40.0   < > 35.4* 33.8* 32.7*  --  35.6* 32.8*  MCV 79.4*   < > 77.8* 79.2* 79.8*  --  79.6* 79.8*  PLT 437*   < >  364 422* 425*  --  370 427*   < > = values in this interval not displayed.   Basic Metabolic Panel: Recent Labs  Lab 02/28/18 1409 03/01/18 0516 03/02/18 0920 03/03/18 0746 03/04/18 0500  NA 136 134* 133* 133* 134*  K 3.4* 3.4* 3.6 4.2 3.9  CL 97* 96* 95* 98 95*  CO2 28 27 25 22 25   GLUCOSE 139* 130* 140* 136* 169*  BUN 44* 15 31* 19 29*  CREATININE 7.26* 4.17* 5.70* 3.62* 4.88*  CALCIUM 8.2* 7.9* 8.2* 7.9* 8.0*  PHOS 3.5  --  3.0  --   --    GFR: Estimated Creatinine Clearance: 8.3 mL/min (A) (by C-G formula based on SCr of 4.88 mg/dL (H)). Liver Function  Tests: Recent Labs  Lab 02/27/18 1336 02/28/18 1409 03/02/18 0920  AST 19  --   --   ALT 11  --   --   ALKPHOS 114  --   --   BILITOT 0.8  --   --   PROT 7.9  --   --   ALBUMIN 3.0* 2.2* 2.4*   No results for input(s): LIPASE, AMYLASE in the last 168 hours. No results for input(s): AMMONIA in the last 168 hours. Coagulation Profile: No results for input(s): INR, PROTIME in the last 168 hours. Cardiac Enzymes: No results for input(s): CKTOTAL, CKMB, CKMBINDEX, TROPONINI in the last 168 hours. BNP (last 3 results) No results for input(s): PROBNP in the last 8760 hours. HbA1C: No results for input(s): HGBA1C in the last 72 hours. CBG: Recent Labs  Lab 03/03/18 1146 03/03/18 1613 03/03/18 2128 03/04/18 0826 03/04/18 1204  GLUCAP 134* 121* 184* 117* 109*   Lipid Profile: No results for input(s): CHOL, HDL, LDLCALC, TRIG, CHOLHDL, LDLDIRECT in the last 72 hours. Thyroid Function Tests: No results for input(s): TSH, T4TOTAL, FREET4, T3FREE, THYROIDAB in the last 72 hours. Anemia Panel: No results for input(s): VITAMINB12, FOLATE, FERRITIN, TIBC, IRON, RETICCTPCT in the last 72 hours. Urine analysis:    Component Value Date/Time   COLORURINE YELLOW 12/17/2006 0005   APPEARANCEUR TURBID (A) 12/17/2006 0005   LABSPEC 1.019 12/17/2006 0005   PHURINE 5.0 12/17/2006 0005   GLUCOSEU NEGATIVE 12/17/2006 0005   HGBUR MODERATE (A) 12/17/2006 0005   BILIRUBINUR SMALL (A) 12/17/2006 0005   KETONESUR 15 (A) 12/17/2006 0005   PROTEINUR NEGATIVE 12/17/2006 0005   UROBILINOGEN 0.2 12/17/2006 0005   NITRITE NEGATIVE 12/17/2006 0005   LEUKOCYTESUR LARGE (A) 12/17/2006 0005     Kaily Wragg M.D. [facility] 03/04/2018, 2:27 PM  Pager: [phone] Between 7am to 7pm - call Pager - [phone]  After 7pm go to [email or web address] - password [email or web address]  Call night coverage person covering after 7pm

## 2018-03-04 NOTE — Progress Notes (Signed)
Occupational Therapy Treatment Patient Details Name: [name] MRN: [id number] DOB: 1932-07-03 Today's Date: 03/04/2018    OT comments  Pt seen in conjunction with PT due to need for dc plan change. At this time Pt will require max HH services including HHOT and WC, Hoyer lift (with amputee pad), hospital bed. Full note to come at a later date.    Sherryl Manges OTR/L Acute Rehabilitation Services Pager: [phone] Office: [phone]

## 2018-03-04 NOTE — Procedures (Signed)
I was present at this dialysis session. I have reviewed the session itself and made appropriate changes.   Vital signs in last 24 hours:  Temp:  [98 F (36.7 C)-99.5 F (37.5 C)] 99.1 F (37.3 C) (10/26 0448) Pulse Rate:  [60-76] 76 (10/26 0448) Resp:  [16-18] 16 (10/26 0448) BP: (86-146)/(26-75) 146/75 (10/26 0448) SpO2:  [93 %-100 %] 100 % (10/26 0448) Weight change:  Filed Weights   03/02/18 0830 03/02/18 1310 03/02/18 2116  Weight: 76.3 kg 73.7 kg 77.1 kg    Recent Labs  Lab 03/02/18 0920  03/04/18 0500  NA 133*   < > 134*  K 3.6   < > 3.9  CL 95*   < > 95*  CO2 25   < > 25  GLUCOSE 140*   < > 169*  BUN 31*   < > 29*  CREATININE 5.70*   < > 4.88*  CALCIUM 8.2*   < > 8.0*  PHOS 3.0  --   --    < > = values in this interval not displayed.    Recent Labs  Lab 02/27/18 1336  03/02/18 0807 03/02/18 1445 03/03/18 0746 03/04/18 0727  WBC 17.9*   < > 51.8*  --  42.6* 52.8*  NEUTROABS 14.5*  --   --  41.3* 40.0*  --   HGB 10.8*   < > 8.9*  --  9.8* 9.3*  HCT 40.0   < > 32.7*  --  35.6* 32.8*  MCV 79.4*   < > 79.8*  --  79.6* 79.8*  PLT 437*   < > 425*  --  370 427*   < > = values in this interval not displayed.    Scheduled Meds: . amiodarone  200 mg Oral BID  . apixaban  2.5 mg Oral BID  . Chlorhexidine Gluconate Cloth  6 each Topical Q0600  . cinacalcet  30 mg Oral Q supper  . [START ON 03/07/2018] darbepoetin (ARANESP) injection - DIALYSIS  60 mcg Intravenous Q Tue-HD  . digoxin  0.0625 mg Oral Daily  . docusate sodium  100 mg Oral BID  . doxercalciferol  1 mcg Intravenous Q T,Th,Sa-HD  . feeding supplement (NEPRO CARB STEADY)  237 mL Oral Q24H  . feeding supplement (PRO-STAT SUGAR FREE 64)  30 mL Oral BID  . gabapentin  100 mg Oral QHS  . insulin aspart  0-5 Units Subcutaneous QHS  . insulin aspart  0-9 Units Subcutaneous TID WC  . midodrine      . midodrine  10 mg Oral BID WC  . multivitamin  1 tablet Oral QHS  . oxyCODONE-acetaminophen      .  senna-docusate  2 tablet Oral BID  . sevelamer carbonate  2,400 mg Oral TID WC   Continuous Infusions: . sodium chloride    . sodium chloride    . linezolid (ZYVOX) IV 600 mg (03/04/18 0451)  . piperacillin-tazobactam (ZOSYN)  IV 3.375 g (03/03/18 1817)   PRN Meds:.sodium chloride, sodium chloride, acetaminophen **OR** acetaminophen, alteplase, bisacodyl, calcium carbonate (dosed in mg elemental calcium), camphor-menthol **AND** hydrOXYzine, docusate sodium, heparin, lidocaine (PF), lidocaine-prilocaine, ondansetron **OR** ondansetron (ZOFRAN) IV, oxyCODONE-acetaminophen, pentafluoroprop-tetrafluoroeth, polyethylene glycol, sodium phosphate, sorbitol, zolpidem    Dialysis Orders: Garber/Olin T,Th,S 4 hrs 180NRe 400/autoflow 2.0 77 kg 2.0 K/2.25 Ca linear sodium, UFP 2 RIJ TDC -Heparin 5000 units IV TIW -Hectorol 1 mcg IV TIW -Mircera 100 mcg IV q 2 weeks (last dose 02/16/2018-last HGB 10.4 02/23/2018)  Assessment/Plan: 1.  Gangrene L Heel - s/p L BKA 10/23 Dr. Randie Heinz. On Zyvox &  Zosyn.  2. ESRD - on HD TTS.  K 4.2, using increased K bath. Orders written for HD tomorrow if remains admitted, per regular schedule.  3. Anemia of CKD- Hgb 9.8.  Aranesp qwk (tues) Follow trends.  4. Secondary hyperparathyroidism - CCa and phos in goal. Continue VDRA, binders, and sensipar.  5. Hypotension/volume - on midodrine.  Does not appear volume overloaded on exam.  Almost 2kg under EDW last HD.  Continue to titrate down volume as tolerated. Will need new EDW at d/c.   6. Nutrition - Alb 2.4. Renal diet w/fluid restrictions. Nepro, Renavite 7. DM - per primary 8. Hx A fib on eliquis.  Eliquis on hold. On Hep drip. Per primary 9. Rash - on atarax 10. Rising WBC- agree with discussing with ID and r/o C diff.  No clear source for ongoing infection, and agree with further w/u per Dr. Isidoro Donning.  Jamie Cords,  MD 03/04/2018, 9:33 AM

## 2018-03-04 NOTE — Progress Notes (Addendum)
[facility]  Called to evaluate L BKA wound in setting of rising leukocytosis.  BKA with staples intact and c/d/i.  No drainage, no cellulitis, no concern for wound infection.  Overall looks really good.  Discussed with Dr. Isidoro Donning hospitalist.    Cephus Shelling, MD [facility] [place] Office: [phone] Pager: [phone]  Cephus Shelling

## 2018-03-04 NOTE — Consult Note (Signed)
[facility]  Total days of antibiotics 6        Day 6 linezolid/piptazo               Reason for Consult: marked leukocytosis   Referring Physician: rai  Principal Problem:   Gangrene ([place]) Active Problems:   Diabetes mellitus with peripheral vascular disease ([place])   Atherosclerosis of native arteries of the extremities with ulceration ([place])   Anemia of chronic disease   Benign essential HTN   ESRD (end stage renal disease) on dialysis [facility])    HPI: [name] is a 84 y.o. female with hx of  DM, ESRD on HD, CVA, PAD, right BKA in July 2019, but ongoing left leg diabetic foot ulcer that has had progressive gangrenous changes to left heel necessitating amputation.she was started on linezolid plus piptazo this infection and underwent BKA on 10/23. On admit her wbc was 18.7 but continue to increase up to 49K on day of surgery but has not improved despite no new sources of infection found. She has had 2 sets of blood cx drawn on 10/21 and 10/24 which are no growth today. She remains clinically stable, no fevers only leukocytosis. Her left bka appears to be healing appropriately, no erythema or drainage but does has tenderness. She mentioend that she has RUQ abdominal discomfort but also reports she was laying on her telemetry unit that caused her some back pain. She has had one stool while having HD, but no other GI complaints. No cough, and she is on day 6 of abtx.  Past Medical History:  Diagnosis Date  . Anemia   . C. difficile diarrhea   . Depression   . Diabetes mellitus   . Diabetic neuropathy ([place])   . ESRD (end stage renal disease) on dialysis (HCC)    TTS  . GERD (gastroesophageal reflux disease)   . Gout   . Hyperparathyroidism   . MRSA (methicillin resistant staph aureus) culture positive   . Obesity   . Osteoarthritis   . Peripheral arterial disease (HCC)    Gangrene-Left Great Toe  . S/P BKA (below knee amputation) unilateral, right  ([place])   . Stroke [facility])     Allergies:  Allergies  Allergen Reactions  . Codeine Hives  . Vancomycin Rash     MEDICATIONS: . amiodarone  200 mg Oral BID  . apixaban  2.5 mg Oral BID  . Chlorhexidine Gluconate Cloth  6 each Topical Q0600  . cinacalcet  30 mg Oral Q supper  . [START ON 03/07/2018] darbepoetin (ARANESP) injection - DIALYSIS  60 mcg Intravenous Q Tue-HD  . digoxin  0.0625 mg Oral Daily  . docusate sodium  100 mg Oral BID  . doxercalciferol  1 mcg Intravenous Q T,Th,Sa-HD  . feeding supplement (NEPRO CARB STEADY)  237 mL Oral Q24H  . feeding supplement (PRO-STAT SUGAR FREE 64)  30 mL Oral BID  . gabapentin  100 mg Oral QHS  . insulin aspart  0-5 Units Subcutaneous QHS  . insulin aspart  0-9 Units Subcutaneous TID WC  . midodrine  10 mg Oral BID WC  . multivitamin  1 tablet Oral QHS  . senna-docusate  2 tablet Oral BID  . sevelamer carbonate  2,400 mg Oral TID WC    Social History   Tobacco Use  . Smoking status: Former Smoker    Types: Cigarettes    Last attempt to quit: 1980    Years since quitting:  39.8  . Smokeless tobacco: Never Used  Substance Use Topics  . Alcohol use: No  . Drug use: No    Family History  Problem Relation Age of Onset  . Cancer Father        type unknown  . Stroke Mother   . Diabetes Sister   . Cancer Brother        type unknown    Review of Systems  Constitutional: Negative for fever, chills, diaphoresis, activity change, appetite change, fatigue and unexpected weight change.  HENT: Negative for congestion, sore throat, rhinorrhea, sneezing, trouble swallowing and sinus pressure.  Eyes: Negative for photophobia and visual disturbance.  Respiratory: Negative for cough, chest tightness, shortness of breath, wheezing and stridor.  Cardiovascular: Negative for chest pain, palpitations and leg swelling.  Gastrointestinal: +mild right upper quadrant pain Negative for nausea, vomiting, abdominal pain, diarrhea, constipation,  blood in stool, abdominal distention and anal bleeding.  Genitourinary: Negative for dysuria, hematuria, flank pain and difficulty urinating.  Musculoskeletal: Negative for myalgias, back pain, joint swelling, arthralgias and gait problem.  Skin: +left leg tenderness Neurological: Negative for dizziness, tremors, weakness and light-headedness.  Hematological: Negative for adenopathy. Does not bruise/bleed easily.  Psychiatric/Behavioral: Negative for behavioral problems, confusion, sleep disturbance, dysphoric mood, decreased concentration and agitation.      OBJECTIVE: Temp:  [98 F (36.7 C)-99.5 F (37.5 C)] 98.6 F (37 C) (10/26 1100) Pulse Rate:  [60-97] 97 (10/26 1100) Resp:  [16-20] 18 (10/26 1100) BP: (81-146)/(26-75) 103/54 (10/26 1100) SpO2:  [93 %-100 %] 96 % (10/26 0646) Weight:  [75.3 kg-75.9 kg] 75.3 kg (10/26 1100) Physical Exam  Constitutional:  oriented to person, place, and time. appears stated age, chronically ill and well-nourished. No distress.  HENT: [place]/AT, PERRLA, no scleral icterus Mouth/Throat: Oropharynx is clear and moist. No oropharyngeal exudate.  Cardiovascular: Normal rate, regular rhythm and normal heart sounds. Exam reveals no gallop and no friction rub.  No murmur heard.  Pulmonary/Chest: Effort normal and breath sounds normal. No respiratory distress.  has no wheezes.  Neck = supple, no nuchal rigidity Abdominal: Soft. Bowel sounds are normal.  exhibits no distension. There is no tenderness. RUQ pain on palpation. No guarding Lymphadenopathy: no cervical adenopathy. No axillary adenopathy Ext: bilateral bka, left bka has sutures in place well healing. Skin: Skin is warm and dry. No rash noted. No erythema.  Psychiatric: a normal mood and affect.  behavior is normal.    LABS: Results for orders placed or performed during the hospital encounter of 02/27/18 (from the past 48 hour(s))  Culture, blood (routine x 2)     Status: None (Preliminary  result)   Collection Time: 03/02/18  2:30 PM  Result Value Ref Range   Specimen Description BLOOD LEFT ANTECUBITAL    Special Requests      BOTTLES DRAWN AEROBIC ONLY Blood Culture adequate volume   Culture      NO GROWTH 2 DAYS Performed at [facility] [address] [place], [place] [address]    Report Status PENDING   Culture, blood (routine x 2)     Status: None (Preliminary result)   Collection Time: 03/02/18  2:44 PM  Result Value Ref Range   Specimen Description BLOOD LEFT ANTECUBITAL    Special Requests      BOTTLES DRAWN AEROBIC ONLY Blood Culture adequate volume   Culture      NO GROWTH 2 DAYS Performed at [facility], [place] [address] [place], [place] [address]  Report Status PENDING   Differential     Status: Abnormal   Collection Time: 03/02/18  2:45 PM  Result Value Ref Range   Neutro Abs 41.3 (H) 1.7 - 7.7 K/uL   Lymphs Abs 0.0 (L) 0.7 - 4.0 K/uL   Monocytes Absolute 1.3 (H) 0.1 - 1.0 K/uL   Eosinophils Absolute 1.3 (H) 0.0 - 0.5 K/uL   Basophils Absolute 0.0 0.0 - 0.1 K/uL   Neutrophils Relative % 94 %   Lymphocytes Relative 0 %   Monocytes Relative 3 %   Eosinophils Relative 3 %   Basophils Relative 0 %   Band Neutrophils 0 %   Metamyelocytes Relative 0 %   Myelocytes 0 %   Promyelocytes Relative 0 %   Blasts 0 %   nRBC 0 0 /100 WBC   Other 0 %   RBC Morphology BURR CELLS     Comment: POLYCHROMASIA PRESENT ELLIPTOCYTES TARGET CELLS Performed at [facility], [address], [place], [place] [address]   Save Smear     Status: None   Collection Time: 03/02/18  2:45 PM  Result Value Ref Range   Smear Review SMEAR STAINED AND AVAILABLE FOR REVIEW     Comment: Performed at [facility], [place] [address], [place], [place] [address]  Glucose, capillary     Status: Abnormal   Collection Time: 03/02/18  4:35 PM  Result Value Ref Range   Glucose-Capillary 109 (H) 70 - 99 mg/dL  Glucose, capillary     Status: Abnormal    Collection Time: 03/02/18  9:15 PM  Result Value Ref Range   Glucose-Capillary 103 (H) 70 - 99 mg/dL  APTT     Status: Abnormal   Collection Time: 03/02/18 10:38 PM  Result Value Ref Range   aPTT 37 (H) 24 - 36 seconds    Comment:        IF BASELINE aPTT IS ELEVATED, SUGGEST PATIENT RISK ASSESSMENT BE USED TO DETERMINE APPROPRIATE ANTICOAGULANT THERAPY. Performed at [facility], [place] [address], [place], [place] [address]   Basic metabolic panel     Status: Abnormal   Collection Time: 03/03/18  7:46 AM  Result Value Ref Range   Sodium 133 (L) 135 - 145 mmol/L   Potassium 4.2 3.5 - 5.1 mmol/L   Chloride 98 98 - 111 mmol/L   CO2 22 22 - 32 mmol/L   Glucose, Bld 136 (H) 70 - 99 mg/dL   BUN 19 8 - 23 mg/dL   Creatinine, Ser 3.62 (H) 0.44 - 1.00 mg/dL    Comment: DELTA CHECK NOTED   Calcium 7.9 (L) 8.9 - 10.3 mg/dL   GFR calc non Af Amer 11 (L) >60 mL/min   GFR calc Af Amer 12 (L) >60 mL/min    Comment: (NOTE) The eGFR has been calculated using the CKD EPI equation. This calculation has not been validated in all clinical situations. eGFR's persistently <60 mL/min signify possible Chronic Kidney Disease.    Anion gap 13 5 - 15    Comment: Performed at [facility] [address], [place],  [address]  CBC with Differential/Platelet     Status: Abnormal   Collection Time: 03/03/18  7:46 AM  Result Value Ref Range   WBC 42.6 (H) 4.0 - 10.5 K/uL   RBC 4.47 3.87 - 5.11 MIL/uL   Hemoglobin 9.8 (L) 12.0 - 15.0 g/dL   HCT 35.6 (L) 36.0 - 46.0 %   MCV  79.6 (L) 80.0 - 100.0 fL   MCH 21.9 (L) 26.0 - 34.0 pg   MCHC 27.5 (L) 30.0 - 36.0 g/dL   RDW 19.9 (H) 11.5 - 15.5 %   Platelets 370 150 - 400 K/uL   nRBC 0.1 0.0 - 0.2 %   Neutrophils Relative % 94 %   Neutro Abs 40.0 (H) 1.7 - 7.7 K/uL   Lymphocytes Relative 3 %   Lymphs Abs 1.3 0.7 - 4.0 K/uL   Monocytes Relative 1 %   Monocytes Absolute 0.4 0.1 - 1.0 K/uL   Eosinophils Relative 1 %   Eosinophils Absolute  0.4 0.0 - 0.5 K/uL   Basophils Relative 1 %   Basophils Absolute 0.4 (H) 0.0 - 0.1 K/uL   nRBC 0 0 /100 WBC   Tear Drop Cells PRESENT    Burr Cells PRESENT    Polychromasia PRESENT     Comment: Performed at [facility], [place] [address], [place], [place] [address]  Glucose, capillary     Status: Abnormal   Collection Time: 03/03/18  7:48 AM  Result Value Ref Range   Glucose-Capillary 142 (H) 70 - 99 mg/dL  Glucose, capillary     Status: Abnormal   Collection Time: 03/03/18 11:46 AM  Result Value Ref Range   Glucose-Capillary 134 (H) 70 - 99 mg/dL  Glucose, capillary     Status: Abnormal   Collection Time: 03/03/18  4:13 PM  Result Value Ref Range   Glucose-Capillary 121 (H) 70 - 99 mg/dL  Glucose, capillary     Status: Abnormal   Collection Time: 03/03/18  9:28 PM  Result Value Ref Range   Glucose-Capillary 184 (H) 70 - 99 mg/dL  Basic metabolic panel     Status: Abnormal   Collection Time: 03/04/18  5:00 AM  Result Value Ref Range   Sodium 134 (L) 135 - 145 mmol/L   Potassium 3.9 3.5 - 5.1 mmol/L   Chloride 95 (L) 98 - 111 mmol/L   CO2 25 22 - 32 mmol/L   Glucose, Bld 169 (H) 70 - 99 mg/dL   BUN 29 (H) 8 - 23 mg/dL   Creatinine, Ser 4.88 (H) 0.44 - 1.00 mg/dL   Calcium 8.0 (L) 8.9 - 10.3 mg/dL   GFR calc non Af Amer 7 (L) >60 mL/min   GFR calc Af Amer 9 (L) >60 mL/min    Comment: (NOTE) The eGFR has been calculated using the CKD EPI equation. This calculation has not been validated in all clinical situations. eGFR's persistently <60 mL/min signify possible Chronic Kidney Disease.    Anion gap 14 5 - 15    Comment: Performed at [facility] [address], [place], [place] [address]  CBC     Status: Abnormal   Collection Time: 03/04/18  7:27 AM  Result Value Ref Range   WBC 52.8 (HH) 4.0 - 10.5 K/uL    Comment: REPEATED TO VERIFY THIS CRITICAL RESULT HAS VERIFIED AND BEEN CALLED TO THELMA NARTEY, RN BY KAY WOOLLEN ON 10 26 2019 AT 0835, AND HAS BEEN READ  BACK.     RBC 4.11 3.87 - 5.11 MIL/uL   Hemoglobin 9.3 (L) 12.0 - 15.0 g/dL   HCT 32.8 (L) 36.0 - 46.0 %   MCV 79.8 (L) 80.0 - 100.0 fL   MCH 22.6 (L) 26.0 - 34.0 pg   MCHC 28.4 (L) 30.0 - 36.0 g/dL   RDW 19.9 (H) 11.5 - 15.5 %   Platelets  427 (H) 150 - 400 K/uL   nRBC 0.1 0.0 - 0.2 %    Comment: Performed at [facility], [place] [address], [place], [place] [address]  Differential     Status: Abnormal   Collection Time: 03/04/18  7:27 AM  Result Value Ref Range   Neutrophils Relative % 90 %   Neutro Abs 47.7 (H) 1.7 - 7.7 K/uL   Lymphocytes Relative 3 %   Lymphs Abs 1.6 0.7 - 4.0 K/uL   Monocytes Relative 2 %   Monocytes Absolute 1.1 (H) 0.1 - 1.0 K/uL   Eosinophils Relative 2 %   Eosinophils Absolute 1.3 (H) 0.0 - 0.5 K/uL   Basophils Relative 0 %   Basophils Absolute 0.1 0.0 - 0.1 K/uL   WBC Morphology VACUOLATED NEUTROPHILS    Dohle Bodies PRESENT    Tear Drop Cells PRESENT    Burr Cells PRESENT     Comment: Performed at [facility], [place] [address], [place], [place] [address]  Glucose, capillary     Status: Abnormal   Collection Time: 03/04/18  8:26 AM  Result Value Ref Range   Glucose-Capillary 117 (H) 70 - 99 mg/dL  Glucose, capillary     Status: Abnormal   Collection Time: 03/04/18 12:04 PM  Result Value Ref Range   Glucose-Capillary 109 (H) 70 - 99 mg/dL    MICRO: reviewed IMAGING: No results found.  Assessment/Plan:  84yo f initiall admitted with leukocytosis with left leg ulcer s/p bka with ongoing leukocytosis despite source control from original presentation  - recommend to get abd/pelvis CT vs. RUQ U/s to see if any signs of cholecystis - would recommend to stop abtx after today and watch off of abtx, unsure what we are treating since she has had her original DFU amputated - if she starts to have diffuse water diarrhea, have low threshold to test and treat for cdifficile.  Elzie Rings [facility] Infect[facility]  D[facility] [phone]

## 2018-03-05 ENCOUNTER — Inpatient Hospital Stay (HOSPITAL_COMMUNITY): Payer: Medicare Other

## 2018-03-05 LAB — CBC WITH DIFFERENTIAL/PLATELET
Abs Immature Granulocytes: 1.93 10*3/uL — ABNORMAL HIGH (ref 0.00–0.07)
BASOS PCT: 0 %
Basophils Absolute: 0.2 10*3/uL — ABNORMAL HIGH (ref 0.0–0.1)
EOS ABS: 1 10*3/uL — AB (ref 0.0–0.5)
Eosinophils Relative: 2 %
HEMATOCRIT: 34.2 % — AB (ref 36.0–46.0)
Hemoglobin: 9.5 g/dL — ABNORMAL LOW (ref 12.0–15.0)
IMMATURE GRANULOCYTES: 3 %
Lymphocytes Relative: 3 %
Lymphs Abs: 2 10*3/uL (ref 0.7–4.0)
MCH: 21.9 pg — ABNORMAL LOW (ref 26.0–34.0)
MCHC: 27.8 g/dL — AB (ref 30.0–36.0)
MCV: 78.8 fL — AB (ref 80.0–100.0)
MONO ABS: 1.3 10*3/uL — AB (ref 0.1–1.0)
MONOS PCT: 2 %
NEUTROS ABS: 59.5 10*3/uL — AB (ref 1.7–7.7)
NEUTROS PCT: 90 %
PLATELETS: 377 10*3/uL (ref 150–400)
RBC: 4.34 MIL/uL (ref 3.87–5.11)
RDW: 20.2 % — AB (ref 11.5–15.5)
WBC: 66 10*3/uL — AB (ref 4.0–10.5)
nRBC: 0.2 % (ref 0.0–0.2)

## 2018-03-05 LAB — BASIC METABOLIC PANEL
ANION GAP: 19 — AB (ref 5–15)
BUN: 17 mg/dL (ref 8–23)
CO2: 22 mmol/L (ref 22–32)
Calcium: 7.9 mg/dL — ABNORMAL LOW (ref 8.9–10.3)
Chloride: 93 mmol/L — ABNORMAL LOW (ref 98–111)
Creatinine, Ser: 3.37 mg/dL — ABNORMAL HIGH (ref 0.44–1.00)
GFR calc Af Amer: 14 mL/min — ABNORMAL LOW (ref >60)
GFR, EST NON AFRICAN AMERICAN: 12 mL/min — AB (ref >60)
Glucose, Bld: 197 mg/dL — ABNORMAL HIGH (ref 70–99)
POTASSIUM: 3.7 mmol/L (ref 3.5–5.1)
SODIUM: 134 mmol/L — AB (ref 135–145)

## 2018-03-05 LAB — GLUCOSE, CAPILLARY
GLUCOSE-CAPILLARY: 197 mg/dL — AB (ref 70–99)
GLUCOSE-CAPILLARY: 178 mg/dL — AB (ref 70–99)
GLUCOSE-CAPILLARY: 172 mg/dL — AB (ref 70–99)
GLUCOSE-CAPILLARY: 137 mg/dL — AB (ref 70–99)

## 2018-03-05 MED ORDER — METRONIDAZOLE IN NACL 5-0.79 MG/ML-% IV SOLN
500.0000 mg | Freq: Three times a day (TID) | INTRAVENOUS | Status: DC
  Administered 2018-03-05 – 2018-03-06 (×3): 500 mg via INTRAVENOUS
  Filled 2018-03-05 (×3): qty 100

## 2018-03-05 MED ORDER — CIPROFLOXACIN IN D5W 400 MG/200ML IV SOLN: 400.0000 mg | INTRAVENOUS | Status: DC

## 2018-03-05 MED ORDER — FIDAXOMICIN 200 MG PO TABS
200.0000 mg | ORAL_TABLET | Freq: Two times a day (BID) | ORAL | Status: DC
  Administered 2018-03-05 – 2018-03-06 (×2): 200 mg via ORAL
  Filled 2018-03-05 (×2): qty 1

## 2018-03-05 MED ORDER — CIPROFLOXACIN IN D5W 400 MG/200ML IV SOLN: 400.0000 mg | Freq: Two times a day (BID) | INTRAVENOUS | Status: DC

## 2018-03-05 MED ORDER — APIXABAN 2.5 MG PO TABS
2.5000 mg | ORAL_TABLET | Freq: Two times a day (BID) | ORAL | Status: DC
  Administered 2018-03-06 – 2018-03-10 (×9): 2.5 mg via ORAL
  Filled 2018-03-05 (×9): qty 1

## 2018-03-05 NOTE — Progress Notes (Signed)
[facility]    Date of Admission:  02/27/2018   Total days of antibiotics 6   ID: [name] is a 84 y.o. female with  leukocytosis Principal Problem:   Gangrene (HCC) Active Problems:   Diabetes mellitus with peripheral vascular disease (HCC)   Atherosclerosis of native arteries of the extremities with ulceration (HCC)   Anemia of chronic disease   Benign essential HTN   ESRD (end stage renal disease) on dialysis ([facility])    Subjective: Afebrile, uncomfortable in bed but denies abdominal pain. She denies diarrhea. Her left leg (new BKA is sore)  24hr event= wbc increased from 53 to 66K (while on abtx)  Medications:  . amiodarone  200 mg Oral BID  . apixaban  2.5 mg Oral BID  . Chlorhexidine Gluconate Cloth  6 each Topical Q0600  . cinacalcet  30 mg Oral Q supper  . [START ON 03/07/2018] darbepoetin (ARANESP) injection - DIALYSIS  60 mcg Intravenous Q Tue-HD  . digoxin  0.0625 mg Oral Daily  . doxercalciferol  1 mcg Intravenous Q T,Th,Sa-HD  . feeding supplement (NEPRO CARB STEADY)  237 mL Oral Q24H  . feeding supplement (PRO-STAT SUGAR FREE 64)  30 mL Oral BID  . gabapentin  100 mg Oral QHS  . insulin aspart  0-5 Units Subcutaneous QHS  . insulin aspart  0-9 Units Subcutaneous TID WC  . midodrine  10 mg Oral BID WC  . multivitamin  1 tablet Oral QHS  . sevelamer carbonate  2,400 mg Oral TID WC    Objective: Vital signs in last 24 hours: Temp:  [98 F (36.7 C)-98.8 F (37.1 C)] 98.6 F (37 C) (10/27 0913) Pulse Rate:  [78-106] 106 (10/27 0913) Resp:  [16-18] 18 (10/27 0913) BP: (101-124)/(50-68) 112/62 (10/27 0913) SpO2:  [75 %-100 %] 94 % (10/27 0913) Weight:  [75.3 kg] 75.3 kg (10/26 1100) Physical Exam  Constitutional:  oriented to person, place, and time. appears well-developed and well-nourished. No distress.  HENT: [place]/AT, PERRLA, no scleral icterus Mouth/Throat: Oropharynx is clear and moist. No oropharyngeal exudate.    Cardiovascular: Normal rate, regular rhythm and normal heart sounds. Exam reveals no gallop and no friction rub.  No murmur heard.  Pulmonary/Chest: Effort normal and breath sounds normal. No respiratory distress.  has no wheezes.  Neck = supple, no nuchal rigidity Abdominal: Soft. Bowel sounds are normal.  exhibits no distension. There is no tenderness.  Lymphadenopathy: no cervical adenopathy. No axillary adenopathy Neurological: alert and oriented to person, place, and time.  Skin: left BKA is dry. Clean incision that has retained staples.  Ext - sore at edge of wound but thigh and knee are not tendder Psychiatric: a normal mood and affect.  behavior is normal.      Lab Results Recent Labs    03/04/18 0500 03/04/18 0727 03/05/18 0650  WBC  --  52.8* 66.0*  HGB  --  9.3* 9.5*  HCT  --  32.8* 34.2*  NA 134*  --  134*  K 3.9  --  3.7  CL 95*  --  93*  CO2 25  --  22  BUN 29*  --  17  CREATININE 4.88*  --  3.37*    Microbiology: Cultures ngtd Studies/Results: No results found.   Assessment/Plan: Leukocytosis = thought to be having reactive process unclear what is driving it. Thus far, no new infections found. Having abd/pelvis CT today. Will stop abtx since she has received  6 days (5 days post amputation)  Will continue to monitor.  If CT showing colitis, she does have hx of cdifficile in the past. Would have low threshold to test but does not appear to have clinical symptoms suggesting cdifficile infection at this time - no abdominal discomfort nor diarrhea  [facility] for Infectious Diseases Cell: [phone] Pager: [phone]  03/05/2018, 10:58 AM

## 2018-03-05 NOTE — Consult Note (Addendum)
Consultation  Referring Provider:     Dr. Isidoro Donning Primary Care Physician:  Mila Palmer, MD Primary Gastroenterologist:      Seen Dr. Arlyce Dice previously Reason for Consultation:     Abnormal rectum on CT scan     Impression / Plan:   #1 abnormal rectum on CT scan with thickening raising question of rectal cancer or proctitis/colitis.  This is not rectal cancer as rectal exam does not reveal any mass  #2 watery diarrhea I suspect C. difficile as the cause of all of this  I have ordered C. difficile quick screen and enteric precautions.  She has been placed on Dificid and IV metronidazole which is a call I am not aware of though it does treat recurrent C. difficile as she has a history of it.  I considered starting vancomycin as her white count is 66,000 apparently she has a rash to it.  Perhaps that was IV not sure she is covered with Dificid and IV metronidazole.  We can resume her Eliquis it would not preclude a diagnostic flex sig anyway  I ordered it to restart  I appreciate the opportunity to care for this patient.  Iva Boop, MD, [facility] 03/05/2018 5:20 PM Pager [phone]         HPI:   [name] is a 84 y.o. female in the hospital with gangrene status post a left below the knee amputation recently, who has a leukocytosis and I believe had a fever prompting a CT scan which showed a very abnormal rectum with thickening raising the question of a mass versus a proctocolitis.  The patient does not participate with history much.  When she is turned over in the bed there is a large amount of liquid stool present.  Past Medical History:  Diagnosis Date  . Anemia   . C. difficile diarrhea   . Depression   . Diabetes mellitus   . Diabetic neuropathy (HCC)   . ESRD (end stage renal disease) on dialysis (HCC)    TTS  . GERD (gastroesophageal reflux disease)   . Gout   . Hyperparathyroidism   . MRSA (methicillin resistant staph aureus)  culture positive   . Obesity   . Osteoarthritis   . Peripheral arterial disease (HCC)    Gangrene-Left Great Toe  . S/P BKA (below knee amputation) unilateral, right (HCC)   . Stroke [facility])     Past Surgical History:  Procedure Laterality Date  . ABDOMINAL AORTOGRAM N/A 07/05/2016   Procedure: Abdominal Aortogram;  Surgeon: Maeola Harman, MD;  Location: [facility] INVASIVE CV LAB;  Service: Cardiovascular;  Laterality: N/A;  . ABDOMINAL AORTOGRAM N/A 06/24/2017   Procedure: ABDOMINAL AORTOGRAM;  Surgeon: Sherren Kerns, MD;  Location: [facility] INVASIVE CV LAB;  Service: Cardiovascular;  Laterality: N/A;  . ABDOMINAL AORTOGRAM W/LOWER EXTREMITY N/A 01/23/2018   Procedure: ABDOMINAL AORTOGRAM W/LOWER EXTREMITY - Left Side;  Surgeon: Maeola Harman, MD;  Location: [facility] INVASIVE CV LAB;  Service: Cardiovascular;  Laterality: N/A;  . ABDOMINAL HYSTERECTOMY    . AMPUTATION Right 11/11/2017   Procedure: RIGHT BELOW KNEE AMPUTATION;  Surgeon: Nadara Mustard, MD;  Location: [facility] OR;  Service: Orthopedics;  Laterality: Right;  . AMPUTATION Left 03/01/2018   Procedure: BELOW KNEE LEFT LEG  AMPUTATION;  Surgeon: Maeola Harman, MD;  Location: [facility] OR;  Service: Vascular;  Laterality: Left;  . AV FISTULA PLACEMENT Left 07/07/2015   Procedure: ATTEMPTED INSERTION OFLEFT  ARTERIOVENOUS (AV)  GORE-TEX GRAFT THIGH;  Surgeon: Fransisco Hertz, MD;  Location: [facility] OR;  Service: Vascular;  Laterality: Left;  . BELOW KNEE LEG AMPUTATION Right 11/11/2017  . BREAST EXCISIONAL BIOPSY Right 05/25/2006  . COLONOSCOPY    . LOWER EXTREMITY ANGIOGRAPHY Bilateral 07/05/2016   Procedure: Lower Extremity Angiography;  Surgeon: Maeola Harman, MD;  Location: [facility] INVASIVE CV LAB;  Service: Cardiovascular;  Laterality: Bilateral;  . LOWER EXTREMITY ANGIOGRAPHY Bilateral 06/24/2017   Procedure: Lower Extremity Angiography;  Surgeon: Sherren Kerns, MD;  Location: [facility] INVASIVE CV LAB;  Service: Cardiovascular;   Laterality: Bilateral;  . MULTIPLE TOOTH EXTRACTIONS    . PERIPHERAL VASCULAR BALLOON ANGIOPLASTY Left 01/23/2018   Procedure: PERIPHERAL VASCULAR BALLOON ANGIOPLASTY;  Surgeon: Maeola Harman, MD;  Location: [facility] INVASIVE CV LAB;  Service: Cardiovascular;  Laterality: Left;  peroneal  . PERIPHERAL VASCULAR INTERVENTION Right 06/24/2017   Procedure: PERIPHERAL VASCULAR INTERVENTION;  Surgeon: Sherren Kerns, MD;  Location: [facility] INVASIVE CV LAB;  Service: Cardiovascular;  Laterality: Right;  POPLITEALPTA SFA STENT  . SHUNTOGRAM Right 09/06/12   Thigh graft  . SP DECLOT AVGG Right Sep 14, 2012   Right thigh graft    Family History  Problem Relation Age of Onset  . Cancer Father        type unknown  . Stroke Mother   . Diabetes Sister   . Cancer Brother        type unknown    Social History   Tobacco Use  . Smoking status: Former Smoker    Types: Cigarettes    Last attempt to quit: 1980    Years since quitting: 39.8  . Smokeless tobacco: Never Used  Substance Use Topics  . Alcohol use: No  . Drug use: No    Prior to Admission medications   Medication Sig Start Date End Date Taking? Authorizing Provider  acetaminophen (TYLENOL) 325 MG tablet Take 650 mg by mouth every 6 (six) hours as needed for fever.   Yes [provider]  Amino Acids-Protein Hydrolys (FEEDING SUPPLEMENT, PRO-STAT SUGAR FREE 64,) LIQD Take 30 mLs by mouth daily.    Yes [provider]  amiodarone (PACERONE) 200 MG tablet Take 1 tablet (200 mg total) by mouth 2 (two) times daily. 02/24/18  Yes Medina-Vargas, Monina C, NP  apixaban (ELIQUIS) 2.5 MG TABS tablet Take 1 tablet (2.5 mg total) by mouth 2 (two) times daily. 02/24/18  Yes Medina-Vargas, Monina C, NP  bisacodyl (DULCOLAX) 10 MG suppository Place 10 mg rectally as needed for moderate constipation.   Yes [provider]  cinacalcet (SENSIPAR) 30 MG tablet Take 1 tablet (30 mg total) by mouth every evening. 02/24/18  Yes  Medina-Vargas, Monina C, NP  clopidogrel (PLAVIX) 75 MG tablet Take 1 tablet (75 mg total) by mouth daily. 02/24/18  Yes Medina-Vargas, Monina C, NP  digoxin (LANOXIN) 0.125 MG tablet Take 0.0625 mg by mouth daily.   Yes [provider]  gabapentin (NEURONTIN) 100 MG capsule Take 1 capsule (100 mg total) by mouth at bedtime. 02/24/18  Yes Medina-Vargas, Monina C, NP  metoprolol tartrate (LOPRESSOR) 25 MG tablet Take 0.5 tablets (12.5 mg total) by mouth 2 (two) times daily. 02/24/18  Yes Medina-Vargas, Monina C, NP  midodrine (PROAMATINE) 10 MG tablet Take 1 tablet (10 mg total) by mouth 2 (two) times daily with a meal. 02/27/18  Yes Medina-Vargas, Monina C, NP  Nutritional Supplements (FEEDING SUPPLEMENT, NEPRO CARB STEADY,) LIQD Take 237 mLs by mouth daily.  Yes [provider]  oxyCODONE-acetaminophen (PERCOCET/ROXICET) 5-325 MG tablet Take 1 tablet by mouth every 6 (six) hours as needed. Patient taking differently: Take 1 tablet by mouth every 6 (six) hours as needed for moderate pain.  02/24/18  Yes Medina-Vargas, Monina C, NP  RENVELA 800 MG tablet Take 2-3 tablets (1,600-2,400 mg total) by mouth See admin instructions. Take 2400 mg by mouth 3 times daily with meals and take 1600 mg by mouth with snacks 02/24/18  Yes Medina-Vargas, Monina C, NP  sennosides-docusate sodium (SENOKOT-S) 8.6-50 MG tablet Take 2 tablets by mouth 2 (two) times daily.   Yes [provider]    Current Facility-Administered Medications  Medication Dose Route Frequency Provider Last Rate Last Dose  . acetaminophen (TYLENOL) tablet 650 mg  650 mg Oral Q6H PRN Emilie Rutter, PA-C       Or  . acetaminophen (TYLENOL) suppository 650 mg  650 mg Rectal Q6H PRN Emilie Rutter, PA-C      . amiodarone (PACERONE) tablet 200 mg  200 mg Oral BID Emilie Rutter, PA-C   200 mg at 03/05/18 1559  . bisacodyl (DULCOLAX) suppository 10 mg  10 mg Rectal PRN Emilie Rutter, PA-C      . calcium  carbonate (dosed in mg elemental calcium) suspension 500 mg of elemental calcium  500 mg of elemental calcium Oral Q6H PRN Emilie Rutter, PA-C      . camphor-menthol [facility]) lotion 1 application  1 application Topical Q8H PRN Emilie Rutter, PA-C       And  . hydrOXYzine (ATARAX/VISTARIL) tablet 25 mg  25 mg Oral Q8H PRN Emilie Rutter, PA-C   25 mg at 03/04/18 0510  . Chlorhexidine Gluconate Cloth 2 % PADS 6 each  6 each Topical Q0600 Emilie Rutter, PA-C   6 each at 03/05/18 0354  . cinacalcet (SENSIPAR) tablet 30 mg  30 mg Oral Q supper Pola Corn, NP   30 mg at 03/05/18 1558  . [START ON 03/07/2018] Darbepoetin Alfa (ARANESP) injection 60 mcg  60 mcg Intravenous Q Tue-HD Emilie Rutter, PA-C   60 mcg at 02/28/18 1708  . digoxin (LANOXIN) tablet 0.0625 mg  0.0625 mg Oral Daily Emilie Rutter, PA-C   0.0625 mg at 03/05/18 1558  . docusate sodium (ENEMEEZ) enema 283 mg  1 enema Rectal PRN Emilie Rutter, PA-C      . doxercalciferol (HECTOROL) injection 1 mcg  1 mcg Intravenous Q T,Th,Sa-HD Emilie Rutter, PA-C   1 mcg at 03/04/18 0830  . feeding supplement (NEPRO CARB STEADY) liquid 237 mL  237 mL Oral Q24H Emilie Rutter, PA-C   237 mL at 03/03/18 1123  . feeding supplement (PRO-STAT SUGAR FREE 64) liquid 30 mL  30 mL Oral BID Emilie Rutter, PA-C   30 mL at 03/04/18 1204  . fidaxomicin (DIFICID) tablet 200 mg  200 mg Oral BID Rai, Ripudeep K, MD      . gabapentin (NEURONTIN) capsule 100 mg  100 mg Oral QHS Emilie Rutter, PA-C   100 mg at 03/03/18 2143  . insulin aspart (novoLOG) injection 0-5 Units  0-5 Units Subcutaneous QHS Eveland, Matthew, PA-C      . insulin aspart (novoLOG) injection 0-9 Units  0-9 Units Subcutaneous TID WC Emilie Rutter, PA-C   2 Units at 03/05/18 1306  . metroNIDAZOLE (FLAGYL) IVPB 500 mg  500 mg Intravenous Q8H Rai, Ripudeep K, MD 100 mL/hr at 03/05/18 1601 500 mg at 03/05/18 1601  . midodrine (PROAMATINE) tablet 10 mg  10  mg Oral BID  WC Emilie Rutter, PA-C   10 mg at 03/04/18 1708  . multivitamin (RENA-VIT) tablet 1 tablet  1 tablet Oral QHS Pola Corn, NP   1 tablet at 03/03/18 2143  . ondansetron (ZOFRAN) tablet 4 mg  4 mg Oral Q6H PRN Emilie Rutter, PA-C       Or  . ondansetron [facility]) injection 4 mg  4 mg Intravenous Q6H PRN Emilie Rutter, PA-C   4 mg at 03/05/18 0723  . oxyCODONE-acetaminophen (PERCOCET/ROXICET) 5-325 MG per tablet 1-2 tablet  1-2 tablet Oral Q4H PRN Emilie Rutter, PA-C   1 tablet at 03/04/18 2956  . sevelamer carbonate (RENVELA) tablet 2,400 mg  2,400 mg Oral TID WC Emilie Rutter, PA-C   2,400 mg at 03/04/18 1709  . sodium phosphate (FLEET) 7-19 GM/118ML enema 1 enema  1 enema Rectal Once PRN Emilie Rutter, PA-C      . sorbitol 70 % solution 30 mL  30 mL Oral PRN Emilie Rutter, PA-C      . zolpidem (AMBIEN) tablet 5 mg  5 mg Oral QHS PRN Emilie Rutter, PA-C        Allergies as of 02/27/2018 - Review Complete 02/27/2018  Allergen Reaction Noted  . Codeine Hives 09/11/2011  . Vancomycin Rash 12/25/2010     Review of Systems:    This is positive for those things mentioned in the HPI, also positive for  weakness fever lethargy new amputation       Physical Exam:  Vital signs in last 24 hours: Temp:  [98 F (36.7 C)-98.8 F (37.1 C)] 98 F (36.7 C) (10/27 1710) Pulse Rate:  [78-106] 101 (10/27 1710) Resp:  [16-18] 16 (10/27 1710) BP: (107-124)/(50-68) 114/64 (10/27 1710) SpO2:  [75 %-100 %] 96 % (10/27 1710) Last BM Date: 03/04/18  General:  Morbidly obese moderately ill  eyes:  anicteric. Lungs: Clear to auscultation bilaterally.  Anterior heart:  S1S2, no rubs, murmurs, gallops. Abdomen: Obese soft, non-tender, no hepatosplenomegaly, hernia, or mass and BS+.  Rectal: Dorene Grebe, nurse present, there is a large amount of green liquid stool in the genital area and rectal area.  She has some skin breakdown early decubiti.  Digital rectal exam reveals no  mass but more liquid stool comes out after rectal exam. Lymph:  no cervical or supraclavicular adenopathy. Extremities:  Fresh left BKA Skin  the rectal exam for other details    Data Reviewed:   LAB RESULTS: Recent Labs    03/03/18 0746 03/04/18 0727 03/05/18 0650  WBC 42.6* 52.8* 66.0*  HGB 9.8* 9.3* 9.5*  HCT 35.6* 32.8* 34.2*  PLT 370 427* 377   BMET Recent Labs    03/03/18 0746 03/04/18 0500 03/05/18 0650  NA 133* 134* 134*  K 4.2 3.9 3.7  CL 98 95* 93*  CO2 22 25 22   GLUCOSE 136* 169* 197*  BUN 19 29* 17  CREATININE 3.62* 4.88* 3.37*  CALCIUM 7.9* 8.0* 7.9*    STUDIES: Ct Abdomen Pelvis Wo Contrast  Result Date: 03/05/2018 CLINICAL DATA:  Abdominal pain. Leukocytosis. Clinical concern for colitis. Dialysis patient. EXAM: CT ABDOMEN AND PELVIS WITHOUT CONTRAST TECHNIQUE: Multidetector CT imaging of the abdomen and pelvis was performed following the standard protocol without IV contrast. COMPARISON:  12/30/2006. FINDINGS: Lower chest: Dense coronary artery calcifications. Mild bilateral lower lobe atelectasis. Small right pleural effusion. Hepatobiliary: Multiple gallstones in the gallbladder. The largest measures 1.9 cm. No gallbladder wall thickening or pericholecystic fluid. Diffuse arterial calcifications in the  hepatic arteries. Otherwise, unremarkable liver. Pancreas: Unremarkable. No pancreatic ductal dilatation or surrounding inflammatory changes. Spleen: Normal in size without focal abnormality. Arterial calcifications. Adrenals/Urinary Tract: Normal appearing adrenal glands. Small kidneys. No significant urine in the urinary bladder. Unremarkable ureters. Stomach/Bowel: Scattered colonic diverticula. No evidence of diverticulitis or appendicitis. Pronounced low density concentric inferior rectal wall thickening with a maximum thickness of 1.9 cm on image number 85 series 3, with marked luminal narrowing, extending to the anus. On coronal image number 76, this  has a mass-like appearance, measuring 4.5 x 4.5 cm . Unremarkable stomach and small bowel. Vascular/Lymphatic: Extensive arterial calcifications. No aneurysm or enlarged lymph nodes. Reproductive: Multiple small uterine fibroids. Normal appearing ovaries. Other: No abdominal wall hernia or abnormality. No abdominopelvic ascites. Musculoskeletal: Lumbar and lower thoracic spine degenerative changes. Diffuse osteopenia. IMPRESSION: 1. Pronounced low density concentric inferior rectal wall thickening with marked luminal narrowing, extending to the anus. This has a mass-like appearance. This is concerning for a primary rectal carcinoma or marked focal proctitis. 2. Cholelithiasis without evidence of cholecystitis. 3. Colonic diverticulosis. 4. Extensive atheromatous arterial calcifications, including the coronary arteries. 5. Small right pleural effusion. 6. Mild bilateral lower lobe atelectasis. Electronically Signed   By: Beckie Salts M.D.   On: 03/05/2018 13:36        Thanks   LOS: 6 days   @Carl  Sena Slate, MD, [facility] @  03/05/2018, 5:18 PM

## 2018-03-05 NOTE — Progress Notes (Addendum)
[facility]                                                                              Patient Demographics  [name], is a 84 y.o. female, DOB - [birth year], ZOX:[id number]  Admit date - 02/27/2018   Admitting Physician Jonah Blue, MD  Outpatient Primary MD for the patient is Mila Palmer, MD  Outpatient specialists:   LOS - 6  days   Medical records reviewed and are as summarized below:    Chief Complaint  Patient presents with  . Wound Infection       Brief summary  84 y.o.femalewith medical history significant ofCVA; PAD s/p balloon angioplasty in 2/19 and 9/19 and R BKA in 7/19; obesity (BMI 32); hyperparathyroidism; DM; and ESRD on HD presenting with wound infection in the left foot heel.  Patient presented from skilled nursing facility.  Assessment & Plan    Principal Problem:   Gangrene (HCC) of left foot/heel, leukocytosis -Prior history of right BKA, patient had undergone angiography and angioplasty of the left peroneal artery in 01/2018 -Patient presented with gangrenous changes of the left heel area with toe. -Continue IV Zyvox, Zosyn  -Patient underwent left BKA on 10/23 for the gangrene, stable from VS standpoint, wound clean. Stage II sacral pressure sores clean.  -Repeat blood cultures still negative, mild abdominal pain with one loose BM this am, WBC continues to trend up, 66K today.  -CT abd shows rectal wall thickening to anus with mass like appearance concerning for primary rectal cancer versus acute proctitis.  D/w Dr Ilsa Iha, will place on oral Dificid and IV metronidazole per rec's.  Unusual presentation of acute proctitis as patient was on Zosyn before.  Discussed with GI, Dr. Leone Payor, will evaluate patient for sigmoidoscopy.  -Given multiple comorbidities, palliative care consult also placed.    Active Problems:   Diabetes mellitus type II, with peripheral vascular disease (HCC) -CBG stable, continue sliding scale  insulin  ESRD on hemodialysis -HD TTS, nephrology following,     Anemia of chronic disease -H&H stable    Benign essential HTN -BP stable  History of atrial fibrillation on Eliquis Eliquis was restarted once cleared by vascular surgery, 2.5 mg twice a day  - Will hold Eliquis for possible sigmoidoscopy in a.m.  Stage 2 decubiti to buttock and sacral areas  - wound care per nursing    Code Status: Full CODE STATUS DVT Prophylaxis: Hold Eliquis Family Communication: Discussed in detail with the patient, all imaging results, lab results explained to the patient   Disposition Plan:   Time Spent in minutes 25 minutes  Procedures:  Left BKA 10/23 Hemodialysis  Consultants:   Vascular surgery Nephrology Infectious disease, Dr. Ilsa Iha  Antimicrobials:      Medications  Scheduled Meds: . amiodarone  200 mg Oral BID  . apixaban  2.5 mg Oral BID  . Chlorhexidine Gluconate Cloth  6 each Topical Q0600  . cinacalcet  30 mg Oral Q supper  . [START ON 03/07/2018] darbepoetin (ARANESP) injection - DIALYSIS  60 mcg Intravenous Q Tue-HD  . digoxin  0.0625 mg Oral Daily  . doxercalciferol  1  mcg Intravenous Q T,Th,Sa-HD  . feeding supplement (NEPRO CARB STEADY)  237 mL Oral Q24H  . feeding supplement (PRO-STAT SUGAR FREE 64)  30 mL Oral BID  . gabapentin  100 mg Oral QHS  . insulin aspart  0-5 Units Subcutaneous QHS  . insulin aspart  0-9 Units Subcutaneous TID WC  . midodrine  10 mg Oral BID WC  . multivitamin  1 tablet Oral QHS  . sevelamer carbonate  2,400 mg Oral TID WC   Continuous Infusions: . ciprofloxacin    . metronidazole     PRN Meds:.acetaminophen **OR** acetaminophen, bisacodyl, calcium carbonate (dosed in mg elemental calcium), camphor-menthol **AND** hydrOXYzine, docusate sodium, ondansetron **OR** ondansetron (ZOFRAN) IV, oxyCODONE-acetaminophen, sodium phosphate, sorbitol, zolpidem   Antibiotics   Anti-infectives (From admission, onward)   Start      Dose/Rate Route Frequency Ordered Stop   03/05/18 1530  metroNIDAZOLE (FLAGYL) IVPB 500 mg     500 mg 100 mL/hr over 60 Minutes Intravenous Every 8 hours 03/05/18 1524     03/05/18 1530  ciprofloxacin (CIPRO) IVPB 400 mg     400 mg 200 mL/hr over 60 Minutes Intravenous Every 12 hours 03/05/18 1524     03/02/18 1600  piperacillin-tazobactam (ZOSYN) IVPB 3.375 g  Status:  Discontinued     3.375 g 12.5 mL/hr over 240 Minutes Intravenous Every 12 hours 03/02/18 0947 03/05/18 0617   03/02/18 1600  linezolid (ZYVOX) IVPB 600 mg  Status:  Discontinued     600 mg 300 mL/hr over 60 Minutes Intravenous Every 12 hours 03/02/18 0947 03/02/18 1315   03/02/18 1600  linezolid (ZYVOX) IVPB 600 mg  Status:  Discontinued     600 mg 300 mL/hr over 60 Minutes Intravenous Every 12 hours 03/02/18 1315 03/05/18 0617   02/27/18 1600  piperacillin-tazobactam (ZOSYN) IVPB 2.25 g  Status:  Discontinued     2.25 g 100 mL/hr over 30 Minutes Intravenous Every 8 hours 02/27/18 1527 02/27/18 1544   02/27/18 1600  linezolid (ZYVOX) IVPB 600 mg  Status:  Discontinued     600 mg 300 mL/hr over 60 Minutes Intravenous Every 12 hours 02/27/18 1537 03/02/18 0947   02/27/18 1600  piperacillin-tazobactam (ZOSYN) IVPB 3.375 g  Status:  Discontinued     3.375 g 12.5 mL/hr over 240 Minutes Intravenous Every 12 hours 02/27/18 1544 03/02/18 0947        Subjective:   [name] was seen and examined today.  Mild abdominal pain with 1 loose watery bowel movement this morning.  No fevers or chills.  No chest pain or shortness of breath, nausea or vomiting.  Objective:   Vitals:   03/04/18 2119 03/05/18 0308 03/05/18 0447 03/05/18 0913  BP: 124/68 (!) 117/50 (!) 107/58 112/62  Pulse: 90 91 78 (!) 106  Resp: 16  16 18   Temp: 98.1 F (36.7 C) 98.8 F (37.1 C) 98.6 F (37 C) 98.6 F (37 C)  TempSrc: Oral Oral Oral Oral  SpO2: 100% (!) 75% 92% 94%  Weight:      Height:        Intake/Output Summary (Last 24 hours) at  03/05/2018 1530 Last data filed at 03/05/2018 0920 Gross per 24 hour  Intake 372.76 ml  Output 0 ml  Net 372.76 ml     Wt Readings from Last 3 Encounters:  03/04/18 75.3 kg  02/27/18 76.9 kg  02/24/18 76.9 kg      Exam    General: Alert and oriented x 3, NAD  Eyes:   HEENT:  Atraumatic, normocephalic  Cardiovascular: S1 S2 auscultated. RRR  Respiratory: Clear to auscultation bilaterally, no wheezing, rales or rhonchi  Gastrointestinal: Soft, mildly tender, nondistended, NBS   Ext: left BKA wound clean.  Old right BKA stump clean  Neuro: no new deficits   musculoskeletal: No digital cyanosis, clubbing  Skin: Stage II sacral pressure sores  Psych: Normal affect and demeanor, alert and oriented x3      Data Reviewed:  I have personally reviewed following labs and imaging studies  Micro Results Recent Results (from the past 240 hour(s))  Blood Cultures x 2 sites     Status: None   Collection Time: 02/27/18  5:15 PM  Result Value Ref Range Status   Specimen Description BLOOD RIGHT ANTECUBITAL  Final   Special Requests   Final    BOTTLES DRAWN AEROBIC AND ANAEROBIC Blood Culture adequate volume   Culture   Final    NO GROWTH 5 DAYS Performed at [facility], [address], [place], [place] [address]    Report Status 03/04/2018 FINAL  Final  Blood Cultures x 2 sites     Status: None   Collection Time: 02/27/18  7:10 PM  Result Value Ref Range Status   Specimen Description BLOOD RIGHT FOREARM  Final   Special Requests   Final    BOTTLES DRAWN AEROBIC ONLY Blood Culture adequate volume   Culture   Final    NO GROWTH 5 DAYS Performed at [facility], [address] [place], [place] [address]    Report Status 03/04/2018 FINAL  Final  MRSA PCR Screening     Status: None   Collection Time: 02/27/18 11:04 PM  Result Value Ref Range Status   MRSA by PCR NEGATIVE NEGATIVE Final    Comment:        The GeneXpert MRSA Assay (FDA approved for  NASAL specimens only), is one component of a comprehensive MRSA colonization surveillance program. It is not intended to diagnose MRSA infection nor to guide or monitor treatment for MRSA infections. Performed at [facility], [address] [place], [place] [address]   Culture, blood (routine x 2)     Status: None (Preliminary result)   Collection Time: 03/02/18  2:30 PM  Result Value Ref Range Status   Specimen Description BLOOD LEFT ANTECUBITAL  Final   Special Requests   Final    BOTTLES DRAWN AEROBIC ONLY Blood Culture adequate volume   Culture   Final    NO GROWTH 3 DAYS Performed at [facility], [address], [place], [place] [address]    Report Status PENDING  Incomplete  Culture, blood (routine x 2)     Status: None (Preliminary result)   Collection Time: 03/02/18  2:44 PM  Result Value Ref Range Status   Specimen Description BLOOD LEFT ANTECUBITAL  Final   Special Requests   Final    BOTTLES DRAWN AEROBIC ONLY Blood Culture adequate volume   Culture   Final    NO GROWTH 3 DAYS Performed at [facility], [address] [place], [place] [address]    Report Status PENDING  Incomplete    Radiology Reports Ct Abdomen Pelvis Wo Contrast  Result Date: 03/05/2018 CLINICAL DATA:  Abdominal pain. Leukocytosis. Clinical concern for colitis. Dialysis patient. EXAM: CT ABDOMEN AND PELVIS WITHOUT CONTRAST TECHNIQUE: Multidetector CT imaging of the abdomen and pelvis was performed following the standard protocol without IV contrast.  COMPARISON:  12/30/2006. FINDINGS: Lower chest: Dense coronary artery calcifications. Mild bilateral lower lobe atelectasis. Small right pleural effusion. Hepatobiliary: Multiple gallstones in the gallbladder. The largest measures 1.9 cm. No gallbladder wall thickening or pericholecystic fluid. Diffuse arterial calcifications in the hepatic arteries. Otherwise, unremarkable liver. Pancreas: Unremarkable. No pancreatic ductal  dilatation or surrounding inflammatory changes. Spleen: Normal in size without focal abnormality. Arterial calcifications. Adrenals/Urinary Tract: Normal appearing adrenal glands. Small kidneys. No significant urine in the urinary bladder. Unremarkable ureters. Stomach/Bowel: Scattered colonic diverticula. No evidence of diverticulitis or appendicitis. Pronounced low density concentric inferior rectal wall thickening with a maximum thickness of 1.9 cm on image number 85 series 3, with marked luminal narrowing, extending to the anus. On coronal image number 76, this has a mass-like appearance, measuring 4.5 x 4.5 cm . Unremarkable stomach and small bowel. Vascular/Lymphatic: Extensive arterial calcifications. No aneurysm or enlarged lymph nodes. Reproductive: Multiple small uterine fibroids. Normal appearing ovaries. Other: No abdominal wall hernia or abnormality. No abdominopelvic ascites. Musculoskeletal: Lumbar and lower thoracic spine degenerative changes. Diffuse osteopenia. IMPRESSION: 1. Pronounced low density concentric inferior rectal wall thickening with marked luminal narrowing, extending to the anus. This has a mass-like appearance. This is concerning for a primary rectal carcinoma or marked focal proctitis. 2. Cholelithiasis without evidence of cholecystitis. 3. Colonic diverticulosis. 4. Extensive atheromatous arterial calcifications, including the coronary arteries. 5. Small right pleural effusion. 6. Mild bilateral lower lobe atelectasis. Electronically Signed   By: Beckie Salts M.D.   On: 03/05/2018 13:36   Dg Foot 2 Views Left  Result Date: 02/27/2018 Please see detailed radiograph report in office note.   Lab Data:  CBC: Recent Labs  Lab 02/27/18 1336  03/02/18 0501 03/02/18 0807 03/02/18 1445 03/03/18 0746 03/04/18 0727 03/05/18 0650  WBC 17.9*   < > 49.9* 51.8*  --  42.6* 52.8* 66.0*  NEUTROABS 14.5*  --   --   --  41.3* 40.0* 47.7* 59.5*  HGB 10.8*   < > 9.4* 8.9*  --  9.8*  9.3* 9.5*  HCT 40.0   < > 33.8* 32.7*  --  35.6* 32.8* 34.2*  MCV 79.4*   < > 79.2* 79.8*  --  79.6* 79.8* 78.8*  PLT 437*   < > 422* 425*  --  370 427* 377   < > = values in this interval not displayed.   Basic Metabolic Panel: Recent Labs  Lab 02/28/18 1409 03/01/18 0516 03/02/18 0920 03/03/18 0746 03/04/18 0500 03/05/18 0650  NA 136 134* 133* 133* 134* 134*  K 3.4* 3.4* 3.6 4.2 3.9 3.7  CL 97* 96* 95* 98 95* 93*  CO2 28 27 25 22 25 22   GLUCOSE 139* 130* 140* 136* 169* 197*  BUN 44* 15 31* 19 29* 17  CREATININE 7.26* 4.17* 5.70* 3.62* 4.88* 3.37*  CALCIUM 8.2* 7.9* 8.2* 7.9* 8.0* 7.9*  PHOS 3.5  --  3.0  --   --   --    GFR: Estimated Creatinine Clearance: 12 mL/min (A) (by C-G formula based on SCr of 3.37 mg/dL (H)). Liver Function Tests: Recent Labs  Lab 02/27/18 1336 02/28/18 1409 03/02/18 0920  AST 19  --   --   ALT 11  --   --   ALKPHOS 114  --   --   BILITOT 0.8  --   --   PROT 7.9  --   --   ALBUMIN 3.0* 2.2* 2.4*   No results for input(s): LIPASE, AMYLASE in the  last 168 hours. No results for input(s): AMMONIA in the last 168 hours. Coagulation Profile: No results for input(s): INR, PROTIME in the last 168 hours. Cardiac Enzymes: No results for input(s): CKTOTAL, CKMB, CKMBINDEX, TROPONINI in the last 168 hours. BNP (last 3 results) No results for input(s): PROBNP in the last 8760 hours. HbA1C: No results for input(s): HGBA1C in the last 72 hours. CBG: Recent Labs  Lab 03/04/18 1204 03/04/18 1645 03/04/18 2118 03/05/18 0712 03/05/18 1124  GLUCAP 109* 131* 156* 197* 178*   Lipid Profile: No results for input(s): CHOL, HDL, LDLCALC, TRIG, CHOLHDL, LDLDIRECT in the last 72 hours. Thyroid Function Tests: No results for input(s): TSH, T4TOTAL, FREET4, T3FREE, THYROIDAB in the last 72 hours. Anemia Panel: No results for input(s): VITAMINB12, FOLATE, FERRITIN, TIBC, IRON, RETICCTPCT in the last 72 hours. Urine analysis:    Component Value  Date/Time   COLORURINE YELLOW 12/17/2006 0005   APPEARANCEUR TURBID (A) 12/17/2006 0005   LABSPEC 1.019 12/17/2006 0005   PHURINE 5.0 12/17/2006 0005   GLUCOSEU NEGATIVE 12/17/2006 0005   HGBUR MODERATE (A) 12/17/2006 0005   BILIRUBINUR SMALL (A) 12/17/2006 0005   KETONESUR 15 (A) 12/17/2006 0005   PROTEINUR NEGATIVE 12/17/2006 0005   UROBILINOGEN 0.2 12/17/2006 0005   NITRITE NEGATIVE 12/17/2006 0005   LEUKOCYTESUR LARGE (A) 12/17/2006 0005     Ripudeep Rai M.D. [facility] 03/05/2018, 3:30 PM  Pager: [phone] Between 7am to 7pm - call Pager - [phone]  After 7pm go to [email or web address] - password [email or web address]  Call night coverage person covering after 7pm

## 2018-03-05 NOTE — Progress Notes (Signed)
Pharmacy Antibiotic Note  [name] is a 84 y.o. female admitted on 02/27/2018 with Colitis.  Pharmacy has been consulted for cipro dosing.  Patient develop leukocytosis in the past couple of days. She was started on zosyn and linezolid. ID was consulted. No specific source for the infection. CT was ordered to r/o colitis. Zosyn was dced today. Cipro and flagyl have been ordered. Pt is a HD pt. Flagyl doesn't need to be adjusted.   Plan: Cipro 400mg  IV q24 Flagyl 500mg  IV q8  Height: 5\' 2"  (157.5 cm) Weight: 166 lb 0.1 oz (75.3 kg) IBW/kg (Calculated) : 50.1  Temp (24hrs), Avg:98.4 F (36.9 C), Min:98 F (36.7 C), Max:98.8 F (37.1 C)  Recent Labs  Lab 02/27/18 1357 02/27/18 1651  03/01/18 0516 03/02/18 0501 03/02/18 0807 03/02/18 0920 03/03/18 0746 03/04/18 0500 03/04/18 0727 03/05/18 0650  WBC  --   --    < > 27.2* 49.9* 51.8*  --  42.6*  --  52.8* 66.0*  CREATININE  --   --    < > 4.17*  --   --  5.70* 3.62* 4.88*  --  3.37*  LATICACIDVEN 2.68* 1.84  --   --   --   --   --   --   --   --   --    < > = values in this interval not displayed.    Estimated Creatinine Clearance: 12 mL/min (A) (by C-G formula based on SCr of 3.37 mg/dL (H)).    Allergies  Allergen Reactions  . Codeine Hives  . Vancomycin Rash    Ulyses Southward, PharmD, [place], [place], CPP Infectious Disease Pharmacist Pager: [phone] 03/05/2018 3:44 PM

## 2018-03-05 NOTE — Progress Notes (Signed)
OT Treatment Note: (Complete note from treatment on 03/04/18)  Clinical Impression:  Pt seen in conjunction with PT as discharge plan requires updating. SNF is not an option for this patient, and so max HH services will be required including HHOT, lift equipment (including Hoyer lift with AMPUTEE pad), WC, wide 3 in 1, WC, and sliding board (Pt may have some of these items - will double check next session) Pt is currently unsafe for OOB without lift equipment due to lethargy, decreased strength, balance, safety awareness, and activity tolerance. Pt will require skilled OT in the acute setting and afterwards to continue working on independence in ADL and transfers as well as therapy involving prosthesis and preparation for LLE prosthetic. I would also REALLY like to see a palliative consult/meeting with Pt and family to establish goals moving forward with serious illness.     03/04/18 1800  OT Visit Information  Last OT Received On 03/04/18  Assistance Needed +2  PT/OT/SLP Co-Evaluation/Treatment Yes  Reason for Co-Treatment For patient/therapist safety;To address functional/ADL transfers;Necessary to address cognition/behavior during functional activity  PT goals addressed during session Mobility/safety with mobility;Balance;Strengthening/ROM  OT goals addressed during session ADL's and self-care;Strengthening/ROM  History of Present Illness This 84 y.o. female admitted from SNF presents with wound infection Lt foot heel with ongoing leukocytosis.  SHe underwent Lt BKA 03/01/18.  PMH includes s/p Rt BKA, CVA, PAD, s/p balloon angioplasty, DM, ESRD with HD   Precautions  Precautions Fall  Precaution Comments bil. BKA   Pain Assessment  Pain Assessment Faces  Faces Pain Scale 6  Pain Location Lt BKA with activity   Pain Descriptors / Indicators Grimacing;Operative site guarding  Pain Intervention(s) Limited activity within patient's tolerance;Monitored during session;Repositioned  Cognition   Arousal/Alertness Lethargic  Behavior During Therapy WFL for tasks assessed/performed  Overall Cognitive Status No family/caregiver present to determine baseline cognitive functioning  General Comments Eyes closed for most of session but pt answering questions appropriately sometimes needing repetition. Slow to respond at times.  Upper Extremity Assessment  Upper Extremity Assessment Generalized weakness  Lower Extremity Assessment  Lower Extremity Assessment Defer to PT evaluation  ADL  Overall ADL's  Needs assistance/impaired  Toilet Transfer Moderate assistance;+2 for physical assistance;+2 for safety/equipment (rolling bed level)  Toilet Transfer Details (indicate cue type and reason) unable   Toileting- Clothing Manipulation and Hygiene Total assistance;Bed level  Toileting - Clothing Manipulation Details (indicate cue type and reason) very painful due to sacral wounds, barrier cream applied after cleansing  Bed Mobility  Overal bed mobility Needs Assistance  Bed Mobility Rolling  Rolling Mod assist;+2 for physical assistance;+2 for safety/equipment  General bed mobility comments Able to roll to left/right x2 with assist to reach for rail; easier rolling towards right.  Transfers  General transfer comment Deferred due to safety. - recommend Lift pad - had been working on sliding board transfers with prosthesis  OT - End of Session  Activity Tolerance Patient limited by pain;Patient limited by fatigue  Patient left in bed;with call bell/phone within reach;with bed alarm set;with family/visitor present (family entered at EOS)  Nurse Communication Mobility status;Other (comment) (sacral wounds bleeding)  OT Assessment/Plan  OT Plan Discharge plan needs to be updated;Equipment recommendations need to be updated  OT Visit Diagnosis Pain  Pain - Right/Left Left  Pain - part of body Leg  OT Frequency (ACUTE ONLY) Min 2X/week  Recommendations for Other Services Other  (comment) (Palliative Medicine Consult)  Follow Up Recommendations Home health OT;Supervision/Assistance - 24  hour;Other (comment) (Palliative Medicine Consult; max HH services)  OT Equipment Wheelchair (measurements OT);Wheelchair cushion (measurements OT);Hospital bed;Other (comment) Michiel Sites lift with amputee pad)  AM-PAC OT "6 Clicks" Daily Activity Outcome Measure  Help from another person eating meals? 4  Help from another person taking care of personal grooming? 3  Help from another person toileting, which includes using toliet, bedpan, or urinal? 1  Help from another person bathing (including washing, rinsing, drying)? 2  Help from another person to put on and taking off regular upper body clothing? 2  Help from another person to put on and taking off regular lower body clothing? 1  6 Click Score 13  ADL G Code Conversion CL  OT Goal Progression  Progress towards OT goals Progressing toward goals (limited)  Acute Rehab OT Goals  Patient Stated Goal I want to walk again   OT Time Calculation  OT Start Time (ACUTE ONLY) 1501  OT Stop Time (ACUTE ONLY) 1526  OT Time Calculation (min) 25 min  OT General Charges  $OT Visit 1 Visit  OT Treatments  $Therapeutic Activity 8-22 mins   Sherryl Manges OTR/L Acute Rehabilitation Services Pager: [phone] Office: [phone]

## 2018-03-05 NOTE — Progress Notes (Signed)
Called by O.T. Into patient's room.Patient has MASD on her periinguinal areas,upper right thight and symphysis pubis.Thick barrier cream applied.

## 2018-03-05 NOTE — Progress Notes (Addendum)
[facility] Progress Note   Subjective:   Seen at bedside.  States she is feeling ok today.  Pain improved.   Objective Vitals:   03/04/18 2119 03/05/18 0308 03/05/18 0447 03/05/18 0913  BP: 124/68 (!) 117/50 (!) 107/58 112/62  Pulse: 90 91 78 (!) 106  Resp: 16  16 18   Temp: 98.1 F (36.7 C) 98.8 F (37.1 C) 98.6 F (37 C) 98.6 F (37 C)  TempSrc: Oral Oral Oral Oral  SpO2: 100% (!) 75% 92% 94%  Weight:      Height:       Physical Exam General:NAD, chronically ill appearing female Heart:RRR, no mrg Lungs:CTAB anteriorly  Abdomen:soft, NTND Extremities:b/l BKA, no stump edema, +pain on left w/staples in place Dialysis Access: RIJ [facility]   Filed Weights   03/02/18 2116 03/04/18 0646 03/04/18 1100  Weight: 77.1 kg 75.9 kg 75.3 kg    Intake/Output Summary (Last 24 hours) at 03/05/2018 1218 Last data filed at 03/05/2018 0920 Gross per 24 hour  Intake 372.76 ml  Output 0 ml  Net 372.76 ml    Additional Objective Labs: Basic Metabolic Panel: Recent Labs  Lab 02/28/18 1409  03/02/18 0920 03/03/18 0746 03/04/18 0500 03/05/18 0650  NA 136   < > 133* 133* 134* 134*  K 3.4*   < > 3.6 4.2 3.9 3.7  CL 97*   < > 95* 98 95* 93*  CO2 28   < > 25 22 25 22   GLUCOSE 139*   < > 140* 136* 169* 197*  BUN 44*   < > 31* 19 29* 17  CREATININE 7.26*   < > 5.70* 3.62* 4.88* 3.37*  CALCIUM 8.2*   < > 8.2* 7.9* 8.0* 7.9*  PHOS 3.5  --  3.0  --   --   --    < > = values in this interval not displayed.   Liver Function Tests: Recent Labs  Lab 02/27/18 1336 02/28/18 1409 03/02/18 0920  AST 19  --   --   ALT 11  --   --   ALKPHOS 114  --   --   BILITOT 0.8  --   --   PROT 7.9  --   --   ALBUMIN 3.0* 2.2* 2.4*   CBC: Recent Labs  Lab 03/02/18 0501 03/02/18 0807  03/03/18 0746 03/04/18 0727 03/05/18 0650  WBC 49.9* 51.8*  --  42.6* 52.8* 66.0*  NEUTROABS  --   --    < > 40.0* 47.7* 59.5*  HGB 9.4* 8.9*  --  9.8* 9.3* 9.5*  HCT 33.8* 32.7*  --  35.6*  32.8* 34.2*  MCV 79.2* 79.8*  --  79.6* 79.8* 78.8*  PLT 422* 425*  --  370 427* 377   < > = values in this interval not displayed.   Blood Culture    Component Value Date/Time   SDES BLOOD LEFT ANTECUBITAL 03/02/2018 1444   SPECREQUEST  03/02/2018 1444    BOTTLES DRAWN AEROBIC ONLY Blood Culture adequate volume   CULT  03/02/2018 1444    NO GROWTH 2 DAYS Performed at [facility], [address], [place], [place] [address]    REPTSTATUS PENDING 03/02/2018 1444   CBG: Recent Labs  Lab 03/04/18 1204 03/04/18 1645 03/04/18 2118 03/05/18 0712 03/05/18 1124  GLUCAP 109* 131* 156* 197* 178*   Studies/Results: No results found.  Medications:  . amiodarone  200 mg Oral BID  . apixaban  2.5 mg Oral  BID  . Chlorhexidine Gluconate Cloth  6 each Topical Q0600  . cinacalcet  30 mg Oral Q supper  . [START ON 03/07/2018] darbepoetin (ARANESP) injection - DIALYSIS  60 mcg Intravenous Q Tue-HD  . digoxin  0.0625 mg Oral Daily  . doxercalciferol  1 mcg Intravenous Q T,Th,Sa-HD  . feeding supplement (NEPRO CARB STEADY)  237 mL Oral Q24H  . feeding supplement (PRO-STAT SUGAR FREE 64)  30 mL Oral BID  . gabapentin  100 mg Oral QHS  . insulin aspart  0-5 Units Subcutaneous QHS  . insulin aspart  0-9 Units Subcutaneous TID WC  . midodrine  10 mg Oral BID WC  . multivitamin  1 tablet Oral QHS  . sevelamer carbonate  2,400 mg Oral TID WC    Dialysis Orders: Garber/Olin T,Th,S 4 hrs 180NRe 400/autoflow 2.0 77 kg 2.0 K/2.25 Ca linear sodium, UFP 2 RIJ TDC -Heparin 5000 units IV TIW -Hectorol 1 mcg IV TIW -Mircera 100 mcg IV q 2 weeks (last dose 02/16/2018-last HGB 10.4 02/23/2018)  Assessment/Plan: 1. Gangrene L Heel - s/p L BKA 10/23 Dr. Randie Heinz.  2. Rising WBC - 43>53>66 today.  ID consulting, ABX stopped. CT abdomen ordered.  3. ESRD -on HD TTS. K 4.2, using increased K bath. Orders written for HD tomorrow if remains admitted, per regular schedule.  4. Anemia of CKD-Hgb  9.8. Aranesp qwk (tues) Follow trends.  5. Secondary hyperparathyroidism -CCa and phos in goal. Continue VDRA, binders, and sensipar. 6. Hypotension/volume -on midodrine. Does not appear volume overloaded on exam. Almost 2kg under EDW last HD. Continue to titrate down volume as tolerated. Will need new EDW at d/c.  7. Nutrition -Alb 2.4. Renal diet w/fluid restrictions. Nepro, Renavite 8. DM - per primary 9. Hx A fib on eliquis. Eliquis on hold. On Hep drip. Per primary 10. Rash - on atarax   Virgina Norfolk, PA-C [facility] Pager: [phone] 03/05/2018,12:18 PM  LOS: 6 days   I have seen and examined this patient and agree with plan and assessment in the above note with renal recommendations/intervention highlighted.  WBC continues to climb, ID following and CT of abd and pelvis today to evaluate for possible source.  Concerning for recurrent C diff colitis. Julien Nordmann Lydell Moga,MD 03/05/2018 12:44 PM

## 2018-03-06 DIAGNOSIS — Z885 Allergy status to narcotic agent status: Secondary | ICD-10-CM

## 2018-03-06 DIAGNOSIS — Z8619 Personal history of other infectious and parasitic diseases: Secondary | ICD-10-CM

## 2018-03-06 DIAGNOSIS — Z881 Allergy status to other antibiotic agents status: Secondary | ICD-10-CM

## 2018-03-06 DIAGNOSIS — D72829 Elevated white blood cell count, unspecified: Secondary | ICD-10-CM

## 2018-03-06 DIAGNOSIS — K529 Noninfective gastroenteritis and colitis, unspecified: Secondary | ICD-10-CM | POA: Diagnosis present

## 2018-03-06 DIAGNOSIS — R933 Abnormal findings on diagnostic imaging of other parts of digestive tract: Secondary | ICD-10-CM

## 2018-03-06 DIAGNOSIS — R197 Diarrhea, unspecified: Secondary | ICD-10-CM

## 2018-03-06 LAB — GLUCOSE, CAPILLARY
GLUCOSE-CAPILLARY: 144 mg/dL — AB (ref 70–99)
Glucose-Capillary: 147 mg/dL — ABNORMAL HIGH (ref 70–99)
Glucose-Capillary: 157 mg/dL — ABNORMAL HIGH (ref 70–99)
Glucose-Capillary: 140 mg/dL — ABNORMAL HIGH (ref 70–99)

## 2018-03-06 LAB — BASIC METABOLIC PANEL
Anion gap: 20 — ABNORMAL HIGH (ref 5–15)
BUN: 34 mg/dL — ABNORMAL HIGH (ref 8–23)
CALCIUM: 7.7 mg/dL — AB (ref 8.9–10.3)
CHLORIDE: 93 mmol/L — AB (ref 98–111)
CO2: 19 mmol/L — ABNORMAL LOW (ref 22–32)
Creatinine, Ser: 4.4 mg/dL — ABNORMAL HIGH (ref 0.44–1.00)
GFR calc non Af Amer: 8 mL/min — ABNORMAL LOW (ref >60)
GFR, EST AFRICAN AMERICAN: 10 mL/min — AB (ref >60)
Glucose, Bld: 156 mg/dL — ABNORMAL HIGH (ref 70–99)
Potassium: 4 mmol/L (ref 3.5–5.1)
SODIUM: 132 mmol/L — AB (ref 135–145)

## 2018-03-06 LAB — CBC WITH DIFFERENTIAL/PLATELET
Abs Immature Granulocytes: 1.98 10*3/uL — ABNORMAL HIGH (ref 0.00–0.07)
BASOS ABS: 0.2 10*3/uL — AB (ref 0.0–0.1)
Basophils Relative: 0 %
EOS ABS: 0.6 10*3/uL — AB (ref 0.0–0.5)
Eosinophils Relative: 1 %
HCT: 33 % — ABNORMAL LOW (ref 36.0–46.0)
HEMOGLOBIN: 9.4 g/dL — AB (ref 12.0–15.0)
Immature Granulocytes: 3 %
LYMPHS ABS: 2.8 10*3/uL (ref 0.7–4.0)
LYMPHS PCT: 5 %
MCH: 22.5 pg — AB (ref 26.0–34.0)
MCHC: 28.5 g/dL — AB (ref 30.0–36.0)
MCV: 78.9 fL — ABNORMAL LOW (ref 80.0–100.0)
MONOS PCT: 3 %
Monocytes Absolute: 1.5 10*3/uL — ABNORMAL HIGH (ref 0.1–1.0)
NEUTROS ABS: 51.1 10*3/uL — AB (ref 1.7–7.7)
NEUTROS PCT: 88 %
NRBC: 0.3 % — AB (ref 0.0–0.2)
Platelets: 332 10*3/uL (ref 150–400)
RBC: 4.18 MIL/uL (ref 3.87–5.11)
RDW: 21.2 % — AB (ref 11.5–15.5)
WBC: 58.1 10*3/uL (ref 4.0–10.5)

## 2018-03-06 LAB — C DIFFICILE QUICK SCREEN W PCR REFLEX
C DIFFICILE (CDIFF) TOXIN: NEGATIVE
C DIFFICLE (CDIFF) ANTIGEN: NEGATIVE
C Diff interpretation: NOT DETECTED

## 2018-03-06 MED ORDER — VANCOMYCIN 50 MG/ML ORAL SOLUTION
125.0000 mg | Freq: Four times a day (QID) | ORAL | Status: DC
  Filled 2018-03-06 (×2): qty 2.5

## 2018-03-06 NOTE — Progress Notes (Signed)
Infectious Disease Rounding Team - Pharmacy  84 yo F with hx of CDI in July 2015 treated with PO flagyl at that time. Admitted 10/21 for wound infection, s/p BKA 10/23. Pt has developed watery diarrhea during the course of hospitalization, with thickening of rectum on CT scan. GI suspects recurrence of CDI. Currently treated with fidaxomicin and IV metronidazole.   Patient has a reported allergy (erythematous rash) to IV vancomycin, recorded in 2008. Per 12/2006 note, reaction occurred upon discontinuation of vancomycin. Low suspicion for reaction with PO vancomycin as it is not systemically absorbed from the GI tract.   Treatment with vancomycin is appropriate given that she was previously treated with metronidazole.  Cost of vancomycin vs fidaxomicin gives reason to switch to vancomycin immediately pending CDiff quick scan.    No ileus - IV metronidazole not needed  -discontinue fidaxomicin -discontinue IV metronidazole   -Initiate vancomycin 125 mg PO q6h  Lyanne Co PharmD Student 03/06/2018

## 2018-03-06 NOTE — Progress Notes (Signed)
Patient ID: [name], female   DOB: 1932-05-28, 84 y.o.   MRN: [id number]         [facility]  Date of Admission:  02/27/2018      ASSESSMENT: She has a history of C. difficile colitis several years ago.  She now has watery diarrhea leukocytosis and thickening of her distal colon and rectum compatible with colitis.  I agree with treating empirically pending results of C. difficile assay but would recommend oral vancomycin.  It is not systemically absorbed and is not likely to cause any allergic reaction.  PLAN: 1. Change to daptomycin and metronidazole to oral vancomycin pending C. difficile assay.  Principal Problem:   Colitis Active Problems:   History of Clostridium difficile colitis   Diabetes mellitus with peripheral vascular disease (HCC)   Atherosclerosis of native arteries of the extremities with ulceration (HCC)   Anemia of chronic disease   Benign essential HTN   Gangrene (HCC)   ESRD (end stage renal disease) on dialysis (HCC)   Scheduled Meds: . amiodarone  200 mg Oral BID  . apixaban  2.5 mg Oral BID  . Chlorhexidine Gluconate Cloth  6 each Topical Q0600  . cinacalcet  30 mg Oral Q supper  . [START ON 03/07/2018] darbepoetin (ARANESP) injection - DIALYSIS  60 mcg Intravenous Q Tue-HD  . digoxin  0.0625 mg Oral Daily  . doxercalciferol  1 mcg Intravenous Q T,Th,Sa-HD  . feeding supplement (NEPRO CARB STEADY)  237 mL Oral Q24H  . feeding supplement (PRO-STAT SUGAR FREE 64)  30 mL Oral BID  . gabapentin  100 mg Oral QHS  . insulin aspart  0-5 Units Subcutaneous QHS  . insulin aspart  0-9 Units Subcutaneous TID WC  . midodrine  10 mg Oral BID WC  . multivitamin  1 tablet Oral QHS  . sevelamer carbonate  2,400 mg Oral TID WC  . vancomycin  125 mg Oral QID   Continuous Infusions: PRN Meds:.acetaminophen **OR** acetaminophen, bisacodyl, calcium carbonate (dosed in mg elemental calcium), camphor-menthol **AND** hydrOXYzine, docusate  sodium, ondansetron **OR** ondansetron (ZOFRAN) IV, oxyCODONE-acetaminophen, sodium phosphate, sorbitol, zolpidem   SUBJECTIVE: She developed some watery diarrhea overnight.  Review of Systems: Review of Systems  Constitutional: Negative for chills, diaphoresis and fever.  Gastrointestinal: Positive for diarrhea. Negative for abdominal pain, nausea and vomiting.    Allergies  Allergen Reactions  . Codeine Hives  . Vancomycin Rash    OBJECTIVE: Vitals:   03/05/18 1945 03/05/18 2135 03/06/18 0610 03/06/18 0828  BP: (!) 95/49  (!) 110/47 120/70  Pulse: (!) 104 97 92 76  Resp: 16 20 20 18   Temp: 98.2 F (36.8 C)  (!) 97.4 F (36.3 C) 98.2 F (36.8 C)  TempSrc: Oral  Oral Oral  SpO2: 100% 98% 94%   Weight:      Height:       Body mass index is 30.36 kg/m.  Physical Exam  Constitutional:  She is resting quietly in bed.  Abdominal: Soft. She exhibits no distension. There is no tenderness.    Lab Results Lab Results  Component Value Date   WBC 58.1 (HH) 03/06/2018   HGB 9.4 (L) 03/06/2018   HCT 33.0 (L) 03/06/2018   MCV 78.9 (L) 03/06/2018   PLT 332 03/06/2018    Lab Results  Component Value Date   CREATININE 4.40 (H) 03/06/2018   BUN 34 (H) 03/06/2018   NA 132 (L) 03/06/2018   K 4.0 03/06/2018  CL 93 (L) 03/06/2018   CO2 19 (L) 03/06/2018    Lab Results  Component Value Date   ALT 11 02/27/2018   AST 19 02/27/2018   ALKPHOS 114 02/27/2018   BILITOT 0.8 02/27/2018     Microbiology: Recent Results (from the past 240 hour(s))  Blood Cultures x 2 sites     Status: None   Collection Time: 02/27/18  5:15 PM  Result Value Ref Range Status   Specimen Description BLOOD RIGHT ANTECUBITAL  Final   Special Requests   Final    BOTTLES DRAWN AEROBIC AND ANAEROBIC Blood Culture adequate volume   Culture   Final    NO GROWTH 5 DAYS Performed at [facility], [address], [place], [place] [address]    Report Status 03/04/2018 FINAL  Final  Blood  Cultures x 2 sites     Status: None   Collection Time: 02/27/18  7:10 PM  Result Value Ref Range Status   Specimen Description BLOOD RIGHT FOREARM  Final   Special Requests   Final    BOTTLES DRAWN AEROBIC ONLY Blood Culture adequate volume   Culture   Final    NO GROWTH 5 DAYS Performed at [facility], [address] [place], [place] [address]    Report Status 03/04/2018 FINAL  Final  MRSA PCR Screening     Status: None   Collection Time: 02/27/18 11:04 PM  Result Value Ref Range Status   MRSA by PCR NEGATIVE NEGATIVE Final    Comment:        The GeneXpert MRSA Assay (FDA approved for NASAL specimens only), is one component of a comprehensive MRSA colonization surveillance program. It is not intended to diagnose MRSA infection nor to guide or monitor treatment for MRSA infections. Performed at [facility], [address], [place], [place] [address]   Culture, blood (routine x 2)     Status: None (Preliminary result)   Collection Time: 03/02/18  2:30 PM  Result Value Ref Range Status   Specimen Description BLOOD LEFT ANTECUBITAL  Final   Special Requests   Final    BOTTLES DRAWN AEROBIC ONLY Blood Culture adequate volume   Culture   Final    NO GROWTH 3 DAYS Performed at [facility], [address], [place], [place] [address]    Report Status PENDING  Incomplete  Culture, blood (routine x 2)     Status: None (Preliminary result)   Collection Time: 03/02/18  2:44 PM  Result Value Ref Range Status   Specimen Description BLOOD LEFT ANTECUBITAL  Final   Special Requests   Final    BOTTLES DRAWN AEROBIC ONLY Blood Culture adequate volume   Culture   Final    NO GROWTH 3 DAYS Performed at [facility], [address] [place], [place] [address]    Report Status PENDING  Incomplete  C difficile quick scan w PCR reflex     Status: None   Collection Time: 03/05/18  5:18 PM  Result Value Ref Range Status   C Diff antigen NEGATIVE NEGATIVE Final    C Diff toxin NEGATIVE NEGATIVE Final   C Diff interpretation No C. difficile detected.  Final    Cliffton Asters, MD [facility] for Infectious Disease [facility] [phone] pager   [phone] cell 03/06/2018, 12:27 PM

## 2018-03-06 NOTE — Progress Notes (Signed)
Physical Therapy Treatment Patient Details Name: [name] MRN: [id number] DOB: 1933/03/13 Today's Date: 03/06/2018    History of Present Illness This 84 y.o. female admitted from SNF presents with wound infection Lt foot heel with ongoing leukocytosis.  SHe underwent Lt BKA 03/01/18.  PMH includes s/p Rt BKA, CVA, PAD, s/p balloon angioplasty, DM, ESRD with HD     PT Comments    Pt lethargic throughout session with eyes open only sporadically and to direct command. Focus of session was to perform peri-care and change bed linen as pt had a bowel movement in the bed. NT present to assist. Pt required +2 assist for all aspects of bed mobility including rolling and scooting towards HOB. Noted several areas of skin breakdown while cleaning pt and a skin tear on her R posterior flank. RN notified. Pt may benefit from an air mattress to protect skin integrity while admitted. Will continue to follow.   Follow Up Recommendations  Home health PT;Supervision for mobility/OOB     Equipment Recommendations  Wheelchair;Wheelchair cushion;Hospital bed (air mattress overlay if able to get it); Hoyer Lift (will need AMPUTEE sling)   Recommendations for Other Services       Precautions / Restrictions Precautions Precautions: Fall Precaution Comments: bilateral BKA Restrictions Weight Bearing Restrictions: No    Mobility  Bed Mobility Overal bed mobility: Needs Assistance Bed Mobility: Rolling Rolling: Mod assist;+2 for physical assistance;+2 for safety/equipment         General bed mobility comments: Able to roll to left/right x2 with assist to reach for rail.   Transfers                 General transfer comment: Deferred due to safety. - recommend Lift pad - had been working on sliding board transfers with prosthesis  Ambulation/Gait                 Stairs             Wheelchair Mobility    Modified Rankin (Stroke Patients Only)       Balance                                             Cognition Arousal/Alertness: Lethargic Behavior During Therapy: Flat affect Overall Cognitive Status: Difficult to assess                                 General Comments: Eyes closed for most of session but pt answering questions appropriately sometimes needing repetition. Slow to respond at times.      Exercises      General Comments General comments (skin integrity, edema, etc.): Noted several areas of skin breakdown on buttocks - barrier cream applied during session. Per RN, pt has been refusing foam dressings on back side. Noted a skin tear on R posterior flank and RN notified.       Pertinent Vitals/Pain Pain Assessment: Faces Faces Pain Scale: Hurts a little bit Pain Location: Lt BKA with activity  Pain Descriptors / Indicators: Grimacing;Operative site guarding Pain Intervention(s): Monitored during session;Repositioned    Home Living                      Prior Function            PT  Goals (current goals can now be found in the care plan section) Acute Rehab PT Goals Patient Stated Goal: None stated PT Goal Formulation: With patient Time For Goal Achievement: 03/18/18 Potential to Achieve Goals: Fair Progress towards PT goals: Not progressing toward goals - comment(Lehtargy limiting session)    Frequency    Min 3X/week      PT Plan Current plan remains appropriate    Co-evaluation              AM-PAC PT "6 Clicks" Daily Activity  Outcome Measure  Difficulty turning over in bed (including adjusting bedclothes, sheets and blankets)?: Unable Difficulty moving from lying on back to sitting on the side of the bed? : Unable Difficulty sitting down on and standing up from a chair with arms (e.g., wheelchair, bedside commode, etc,.)?: Unable Help needed moving to and from a bed to chair (including a wheelchair)?: Total Help needed walking in hospital room?: Total Help needed  climbing 3-5 steps with a railing? : Total 6 Click Score: 6    End of Session Equipment Utilized During Treatment: Gait belt Activity Tolerance: Treatment limited by lethargy Patient left: in bed;with call bell/phone within reach;with bed alarm set Nurse Communication: Mobility status;Need for lift equipment(NT present during session) PT Visit Diagnosis: Muscle weakness (generalized) (M62.81);Pain Pain - Right/Left: Left Pain - part of body: Leg     Time: 1610-9604 PT Time Calculation (min) (ACUTE ONLY): 35 min  Charges:  $Therapeutic Activity: 23-37 mins                     Conni Slipper, PT, DPT Acute Rehabilitation Services Pager: [phone] Office: [phone]    Marylynn Pearson 03/06/2018, 11:08 AM

## 2018-03-06 NOTE — Progress Notes (Signed)
Palliative Medicine consult noted. Due to high referral volume, there may be a delay seeing this patient. Please call the Palliative Medicine Team office at [phone] if recommendations are needed in the interim.  Thank you for inviting [place] to see this patient.  Margret Chance Tracie Dore, RN, BSN, [facility] Palliative Medicine Team 03/06/2018 3:09 PM Office [phone]

## 2018-03-06 NOTE — Progress Notes (Signed)
 [facility] Progress Note   Subjective: C/O being hot, otherwise says she feels OK. Enteric precautions.   Objective Vitals:   03/05/18 1945 03/05/18 2135 03/06/18 0610 03/06/18 0828  BP: (!) 95/49  (!) 110/47 120/70  Pulse: (!) 104 97 92 76  Resp: 16 20 20 18   Temp: 98.2 F (36.8 C)  (!) 97.4 F (36.3 C) 98.2 F (36.8 C)  TempSrc: Oral  Oral Oral  SpO2: 100% 98% 94%   Weight:      Height:       Physical Exam General: Chronically ill appearing elderly female in NAD Heart: S1,S2, RRR  Lungs: CTAB Abdomen: soft, NT Extremities: Bilateral BKA. Well healed R BKA, staples intact L BKA without drainage/edema along suture line. No stump edema.  Dialysis Access: RIJ [facility] CDI   Additional Objective Labs: Basic Metabolic Panel: Recent Labs  Lab 02/28/18 1409  03/02/18 0920  03/04/18 0500 03/05/18 0650 03/06/18 0547  NA 136   < > 133*   < > 134* 134* 132*  K 3.4*   < > 3.6   < > 3.9 3.7 4.0  CL 97*   < > 95*   < > 95* 93* 93*  CO2 28   < > 25   < > 25 22 19*  GLUCOSE 139*   < > 140*   < > 169* 197* 156*  BUN 44*   < > 31*   < > 29* 17 34*  CREATININE 7.26*   < > 5.70*   < > 4.88* 3.37* 4.40*  CALCIUM 8.2*   < > 8.2*   < > 8.0* 7.9* 7.7*  PHOS 3.5  --  3.0  --   --   --   --    < > = values in this interval not displayed.   Liver Function Tests: Recent Labs  Lab 02/27/18 1336 02/28/18 1409 03/02/18 0920  AST 19  --   --   ALT 11  --   --   ALKPHOS 114  --   --   BILITOT 0.8  --   --   PROT 7.9  --   --   ALBUMIN 3.0* 2.2* 2.4*   No results for input(s): LIPASE, AMYLASE in the last 168 hours. CBC: Recent Labs  Lab 03/02/18 0807  03/03/18 0746 03/04/18 0727 03/05/18 0650 03/06/18 0547  WBC 51.8*  --  42.6* 52.8* 66.0* 58.1*  NEUTROABS  --    < > 40.0* 47.7* 59.5* 51.1*  HGB 8.9*  --  9.8* 9.3* 9.5* 9.4*  HCT 32.7*  --  35.6* 32.8* 34.2* 33.0*  MCV 79.8*  --  79.6* 79.8* 78.8* 78.9*  PLT 425*  --  370 427* 377 332   < > = values in  this interval not displayed.   Blood Culture    Component Value Date/Time   SDES BLOOD LEFT ANTECUBITAL 03/02/2018 1444   SPECREQUEST  03/02/2018 1444    BOTTLES DRAWN AEROBIC ONLY Blood Culture adequate volume   CULT  03/02/2018 1444    NO GROWTH 3 DAYS Performed at [facility], [address] [place], [place] [address]    REPTSTATUS PENDING 03/02/2018 1444    Cardiac Enzymes: No results for input(s): CKTOTAL, CKMB, CKMBINDEX, TROPONINI in the last 168 hours. CBG: Recent Labs  Lab 03/05/18 0712 03/05/18 1124 03/05/18 1655 03/05/18 2132 03/06/18 0834  GLUCAP 197* 178* 172* 137* 144*   Iron Studies: No results for input(s): IRON,  TIBC, TRANSFERRIN, FERRITIN in the last 72 hours. @lablastinr3 @ Studies/Results: Ct Abdomen Pelvis Wo Contrast  Result Date: 03/05/2018 CLINICAL DATA:  Abdominal pain. Leukocytosis. Clinical concern for colitis. Dialysis patient. EXAM: CT ABDOMEN AND PELVIS WITHOUT CONTRAST TECHNIQUE: Multidetector CT imaging of the abdomen and pelvis was performed following the standard protocol without IV contrast. COMPARISON:  12/30/2006. FINDINGS: Lower chest: Dense coronary artery calcifications. Mild bilateral lower lobe atelectasis. Small right pleural effusion. Hepatobiliary: Multiple gallstones in the gallbladder. The largest measures 1.9 cm. No gallbladder wall thickening or pericholecystic fluid. Diffuse arterial calcifications in the hepatic arteries. Otherwise, unremarkable liver. Pancreas: Unremarkable. No pancreatic ductal dilatation or surrounding inflammatory changes. Spleen: Normal in size without focal abnormality. Arterial calcifications. Adrenals/Urinary Tract: Normal appearing adrenal glands. Small kidneys. No significant urine in the urinary bladder. Unremarkable ureters. Stomach/Bowel: Scattered colonic diverticula. No evidence of diverticulitis or appendicitis. Pronounced low density concentric inferior rectal wall thickening with a maximum  thickness of 1.9 cm on image number 85 series 3, with marked luminal narrowing, extending to the anus. On coronal image number 76, this has a mass-like appearance, measuring 4.5 x 4.5 cm . Unremarkable stomach and small bowel. Vascular/Lymphatic: Extensive arterial calcifications. No aneurysm or enlarged lymph nodes. Reproductive: Multiple small uterine fibroids. Normal appearing ovaries. Other: No abdominal wall hernia or abnormality. No abdominopelvic ascites. Musculoskeletal: Lumbar and lower thoracic spine degenerative changes. Diffuse osteopenia. IMPRESSION: 1. Pronounced low density concentric inferior rectal wall thickening with marked luminal narrowing, extending to the anus. This has a mass-like appearance. This is concerning for a primary rectal carcinoma or marked focal proctitis. 2. Cholelithiasis without evidence of cholecystitis. 3. Colonic diverticulosis. 4. Extensive atheromatous arterial calcifications, including the coronary arteries. 5. Small right pleural effusion. 6. Mild bilateral lower lobe atelectasis. Electronically Signed   By: Beckie Salts M.D.   On: 03/05/2018 13:36   Medications: . metronidazole 500 mg (03/06/18 1025)   . amiodarone  200 mg Oral BID  . apixaban  2.5 mg Oral BID  . Chlorhexidine Gluconate Cloth  6 each Topical Q0600  . cinacalcet  30 mg Oral Q supper  . [START ON 03/07/2018] darbepoetin (ARANESP) injection - DIALYSIS  60 mcg Intravenous Q Tue-HD  . digoxin  0.0625 mg Oral Daily  . doxercalciferol  1 mcg Intravenous Q T,Th,Sa-HD  . feeding supplement (NEPRO CARB STEADY)  237 mL Oral Q24H  . feeding supplement (PRO-STAT SUGAR FREE 64)  30 mL Oral BID  . fidaxomicin  200 mg Oral BID  . gabapentin  100 mg Oral QHS  . insulin aspart  0-5 Units Subcutaneous QHS  . insulin aspart  0-9 Units Subcutaneous TID WC  . midodrine  10 mg Oral BID WC  . multivitamin  1 tablet Oral QHS  . sevelamer carbonate  2,400 mg Oral TID WC     Dialysis Orders: Garber/Olin  T,Th,S 4 hrs 180NRe 400/autoflow 2.0 77 kg 2.0 K/2.25 Ca linear sodium, UFP 2 RIJ TDC -Heparin 5000 units IV TIW -Hectorol 1 mcg IV TIW -Mircera 100 mcg IV q 2 weeks (last dose 02/16/2018-last HGB 10.4 02/23/2018)  Assessment/Plan: 1. Gangrene L Heel - s/p L BKA 10/23 Dr. Randie Heinz.  2. Rising WBC - 43>53>66>58 today.  ID consulting, BC negative. CT abdomen with rectal wall thickening to anus. Concern from primary rectal Ca vs acute proctitis. On metronidazole and fidaxomicin per ID recs.  3. ESRD -on HD TTS. HD 03/07/18 on schedule. K+4.0. Resume heparin with HD.  4. Anemia of CKD-Hgb 9.4. Aranesp  qwk (tues) Follow trends.  5. Secondary hyperparathyroidism -Ca 7.7 Add RFP to today's labs Continue VDRA, binders, and sensipar. 6. Hypotension/volume -on midodrine. Does not appear volume overloaded on exam. Almost 2kg under EDW last HD. Continue to titrate down volume as tolerated. Will need new EDW at d/c.  7. Nutrition -Alb 2.4. Renal diet w/fluid restrictions. Nepro, Renavite 8. DM - per primary 9. Hx A fib on eliquis. Eliquis resumed. Per primary 10. Rash - on atarax   H.  NP-C 03/06/2018, 11:26 AM  [employer] [phone]

## 2018-03-06 NOTE — Progress Notes (Signed)
[facility]                                                                              Patient Demographics  [name], is a 84 y.o. female, DOB - 10-29-1932, ZOX:[id number]  Admit date - 02/27/2018   Admitting Physician Jonah Blue, MD  Outpatient Primary MD for the patient is Mila Palmer, MD  Outpatient specialists:   LOS - 7  days   Medical records reviewed and are as summarized below:    Chief Complaint  Patient presents with  . Wound Infection       Subjective:   [name] was seen and examined today.  Sleepy this morning, difficult to arouse, otherwise stable, per nursing staff no issues overnight.  Complains of this time Seems to have PT and OT at bedside awaiting for patient to wake up  Brief summary   [name] is a 84 y.o.AAfemalewith medical history significant ofCVA; PAD s/p balloon angioplasty in 2/19 and 9/19 and R BKA in 7/19; obesity (BMI 32); hyperparathyroidism; DM; and ESRD on HD presenting with wound infection in the left foot heel.  Patient presented from skilled nursing facility.  Assessment & Plan    Principal Problem:   Gangrene (HCC) of left foot/heel, leukocytosis -Prior history of right BKA, patient had undergone angiography and angioplasty of the left peroneal artery in 01/2018 -Patient presented with gangrenous changes of the left heel area with toe. -Continue IV Zyvox, Zosyn  -Patient underwent left BKA on 10/23 for the gangrene, stable from VS standpoint, wound clean. Stage II sacral pressure sores clean.  -Repeat blood cultures still negative, mild abdominal pain with one loose BM this am, WBC continues to trend up, 66K today.   Rectal wall thickening/proctitis/possible mass CT abd shows rectal wall thickening to anus with mass like appearance concerning for primary rectal cancer versus acute proctitis.   ID: Dr Drue Second, has started oral Dificid and IV metronidazole per rec's.  Unusual presentation of  acute proctitis as patient was on Zosyn before.   -ID team following, change daptomycin and metronidazole to p.o. vancomycin pending C. Difficile assay   Dr. Leone Payor performed DRE yesterday which did not reveal any mass lesions. Given her marked WBC, C diff is a concern. -Pending stool study for C. difficile   -Given multiple comorbidities, palliative care consult also placed.  Pending palliative follow-up  Active Problems:   Diabetes mellitus type II, with peripheral vascular disease (HCC) -CBG stable, continue sliding scale insulin  ESRD on hemodialysis -HD TTS, nephrology following,     Anemia of chronic disease -H&H stable    Benign essential HTN -BP stable  History of atrial fibrillation on Eliquis Eliquis was restarted once cleared by vascular surgery, 2.5 mg twice a day  - Will hold Eliquis for possible sigmoidoscopy in a.m.  Stage 2 decubiti to buttock and sacral areas  - wound care per nursing    Code Status: Full CODE STATUS DVT Prophylaxis: Hold Eliquis Family Communication:  Discussed in detail with the patient, all imaging results, lab results explained to the patient  Disposition Plan: Likely SNF, versus home with home health  >  3 days    Procedures:  Left BKA 10/23 Hemodialysis  Consultants:   Vascular surgery Nephrology Infectious disease, Dr. Ilsa Iha    Medications  Scheduled Meds: . amiodarone  200 mg Oral BID  . apixaban  2.5 mg Oral BID  . Chlorhexidine Gluconate Cloth  6 each Topical Q0600  . cinacalcet  30 mg Oral Q supper  . [START ON 03/07/2018] darbepoetin (ARANESP) injection - DIALYSIS  60 mcg Intravenous Q Tue-HD  . digoxin  0.0625 mg Oral Daily  . doxercalciferol  1 mcg Intravenous Q T,Th,Sa-HD  . feeding supplement (NEPRO CARB STEADY)  237 mL Oral Q24H  . feeding supplement (PRO-STAT SUGAR FREE 64)  30 mL Oral BID  . gabapentin  100 mg Oral QHS  . insulin aspart  0-5 Units Subcutaneous QHS  . insulin aspart  0-9 Units  Subcutaneous TID WC  . midodrine  10 mg Oral BID WC  . multivitamin  1 tablet Oral QHS  . sevelamer carbonate  2,400 mg Oral TID WC   Continuous Infusions:  PRN Meds:.acetaminophen **OR** acetaminophen, bisacodyl, calcium carbonate (dosed in mg elemental calcium), camphor-menthol **AND** hydrOXYzine, docusate sodium, ondansetron **OR** ondansetron (ZOFRAN) IV, oxyCODONE-acetaminophen, sodium phosphate, sorbitol, zolpidem   Antibiotics   Anti-infectives (From admission, onward)   Start     Dose/Rate Route Frequency Ordered Stop   03/06/18 1530  ciprofloxacin (CIPRO) IVPB 400 mg  Status:  Discontinued     400 mg 200 mL/hr over 60 Minutes Intravenous Every 24 hours 03/05/18 1536 03/05/18 1541   03/06/18 1400  vancomycin (VANCOCIN) 50 mg/mL oral solution 125 mg  Status:  Discontinued     125 mg Oral 4 times daily 03/06/18 1220 03/06/18 1358   03/05/18 2200  fidaxomicin (DIFICID) tablet 200 mg  Status:  Discontinued     200 mg Oral 2 times daily 03/05/18 1545 03/06/18 1220   03/05/18 1530  metroNIDAZOLE (FLAGYL) IVPB 500 mg  Status:  Discontinued     500 mg 100 mL/hr over 60 Minutes Intravenous Every 8 hours 03/05/18 1524 03/06/18 1220   03/05/18 1530  ciprofloxacin (CIPRO) IVPB 400 mg  Status:  Discontinued     400 mg 200 mL/hr over 60 Minutes Intravenous Every 12 hours 03/05/18 1524 03/05/18 1536   03/02/18 1600  piperacillin-tazobactam (ZOSYN) IVPB 3.375 g  Status:  Discontinued     3.375 g 12.5 mL/hr over 240 Minutes Intravenous Every 12 hours 03/02/18 0947 03/05/18 0617   03/02/18 1600  linezolid (ZYVOX) IVPB 600 mg  Status:  Discontinued     600 mg 300 mL/hr over 60 Minutes Intravenous Every 12 hours 03/02/18 0947 03/02/18 1315   03/02/18 1600  linezolid (ZYVOX) IVPB 600 mg  Status:  Discontinued     600 mg 300 mL/hr over 60 Minutes Intravenous Every 12 hours 03/02/18 1315 03/05/18 0617   02/27/18 1600  piperacillin-tazobactam (ZOSYN) IVPB 2.25 g  Status:  Discontinued     2.25  g 100 mL/hr over 30 Minutes Intravenous Every 8 hours 02/27/18 1527 02/27/18 1544   02/27/18 1600  linezolid (ZYVOX) IVPB 600 mg  Status:  Discontinued     600 mg 300 mL/hr over 60 Minutes Intravenous Every 12 hours 02/27/18 1537 03/02/18 0947   02/27/18 1600  piperacillin-tazobactam (ZOSYN) IVPB 3.375 g  Status:  Discontinued     3.375 g 12.5 mL/hr over 240 Minutes Intravenous Every 12 hours 02/27/18 1544 03/02/18 0947       Objective:   Vitals:   03/05/18 1945  03/05/18 2135 03/06/18 0610 03/06/18 0828  BP: (!) 95/49  (!) 110/47 120/70  Pulse: (!) 104 97 92 76  Resp: 16 20 20 18   Temp: 98.2 F (36.8 C)  (!) 97.4 F (36.3 C) 98.2 F (36.8 C)  TempSrc: Oral  Oral Oral  SpO2: 100% 98% 94%   Weight:      Height:        Intake/Output Summary (Last 24 hours) at 03/06/2018 1708 Last data filed at 03/06/2018 1445 Gross per 24 hour  Intake 320 ml  Output 0 ml  Net 320 ml     Wt Readings from Last 3 Encounters:  03/04/18 75.3 kg  02/27/18 76.9 kg  02/24/18 76.9 kg     Physical exam  General: Alert and oriented x 2, more somnolent this morning, difficult to arouse  Eyes: Within normal limits,  HEENT:  Atraumatic, normocephalic  Cardiovascular: S1 S2 auscultated. RRR  Respiratory: Clear to auscultation bilaterally, no wheezing, rales or rhonchi  Gastrointestinal: Soft, mildly tender, nondistended, NBS   Ext: left BKA wound clean.  Old right BKA stump clean, staples are clean visible  Neuro: More somnolent this a.m.  musculoskeletal: No digital cyanosis, clubbing, general eyes weaknesses  Skin: Stage II sacral pressure sores  Psych: Normal affect and demeanor, alert and oriented x3      Data Reviewed:  I have personally reviewed following labs and imaging studies  Micro Results Recent Results (from the past 240 hour(s))  Blood Cultures x 2 sites     Status: None   Collection Time: 02/27/18  5:15 PM  Result Value Ref Range Status   Specimen  Description BLOOD RIGHT ANTECUBITAL  Final   Special Requests   Final    BOTTLES DRAWN AEROBIC AND ANAEROBIC Blood Culture adequate volume   Culture   Final    NO GROWTH 5 DAYS Performed at [facility], [address] [place], [place] [address]    Report Status 03/04/2018 FINAL  Final  Blood Cultures x 2 sites     Status: None   Collection Time: 02/27/18  7:10 PM  Result Value Ref Range Status   Specimen Description BLOOD RIGHT FOREARM  Final   Special Requests   Final    BOTTLES DRAWN AEROBIC ONLY Blood Culture adequate volume   Culture   Final    NO GROWTH 5 DAYS Performed at [facility], [address] [place], [place] [address]    Report Status 03/04/2018 FINAL  Final  MRSA PCR Screening     Status: None   Collection Time: 02/27/18 11:04 PM  Result Value Ref Range Status   MRSA by PCR NEGATIVE NEGATIVE Final    Comment:        The GeneXpert MRSA Assay (FDA approved for NASAL specimens only), is one component of a comprehensive MRSA colonization surveillance program. It is not intended to diagnose MRSA infection nor to guide or monitor treatment for MRSA infections. Performed at [facility], [address] [place], [place] [address]   Culture, blood (routine x 2)     Status: None (Preliminary result)   Collection Time: 03/02/18  2:30 PM  Result Value Ref Range Status   Specimen Description BLOOD LEFT ANTECUBITAL  Final   Special Requests   Final    BOTTLES DRAWN AEROBIC ONLY Blood Culture adequate volume   Culture   Final    NO GROWTH 4 DAYS Performed at [facility], [address] [place], [place] [address]    Report Status PENDING  Incomplete  Culture, blood (routine x 2)     Status: None (Preliminary result)   Collection Time: 03/02/18  2:44 PM  Result Value Ref Range Status   Specimen Description BLOOD LEFT ANTECUBITAL  Final   Special Requests   Final    BOTTLES DRAWN AEROBIC ONLY Blood Culture adequate volume   Culture    Final    NO GROWTH 4 DAYS Performed at [facility], [address], [place], [place] [address]    Report Status PENDING  Incomplete  C difficile quick scan w PCR reflex     Status: None   Collection Time: 03/05/18  5:18 PM  Result Value Ref Range Status   C Diff antigen NEGATIVE NEGATIVE Final   C Diff toxin NEGATIVE NEGATIVE Final   C Diff interpretation No C. difficile detected.  Final    Radiology Reports Ct Abdomen Pelvis Wo Contrast  Result Date: 03/05/2018 CLINICAL DATA:  Abdominal pain. Leukocytosis. Clinical concern for colitis. Dialysis patient. EXAM: CT ABDOMEN AND PELVIS WITHOUT CONTRAST TECHNIQUE: Multidetector CT imaging of the abdomen and pelvis was performed following the standard protocol without IV contrast. COMPARISON:  12/30/2006. FINDINGS: Lower chest: Dense coronary artery calcifications. Mild bilateral lower lobe atelectasis. Small right pleural effusion. Hepatobiliary: Multiple gallstones in the gallbladder. The largest measures 1.9 cm. No gallbladder wall thickening or pericholecystic fluid. Diffuse arterial calcifications in the hepatic arteries. Otherwise, unremarkable liver. Pancreas: Unremarkable. No pancreatic ductal dilatation or surrounding inflammatory changes. Spleen: Normal in size without focal abnormality. Arterial calcifications. Adrenals/Urinary Tract: Normal appearing adrenal glands. Small kidneys. No significant urine in the urinary bladder. Unremarkable ureters. Stomach/Bowel: Scattered colonic diverticula. No evidence of diverticulitis or appendicitis. Pronounced low density concentric inferior rectal wall thickening with a maximum thickness of 1.9 cm on image number 85 series 3, with marked luminal narrowing, extending to the anus. On coronal image number 76, this has a mass-like appearance, measuring 4.5 x 4.5 cm . Unremarkable stomach and small bowel. Vascular/Lymphatic: Extensive arterial calcifications. No aneurysm or enlarged lymph nodes.  Reproductive: Multiple small uterine fibroids. Normal appearing ovaries. Other: No abdominal wall hernia or abnormality. No abdominopelvic ascites. Musculoskeletal: Lumbar and lower thoracic spine degenerative changes. Diffuse osteopenia. IMPRESSION: 1. Pronounced low density concentric inferior rectal wall thickening with marked luminal narrowing, extending to the anus. This has a mass-like appearance. This is concerning for a primary rectal carcinoma or marked focal proctitis. 2. Cholelithiasis without evidence of cholecystitis. 3. Colonic diverticulosis. 4. Extensive atheromatous arterial calcifications, including the coronary arteries. 5. Small right pleural effusion. 6. Mild bilateral lower lobe atelectasis. Electronically Signed   By: Beckie Salts M.D.   On: 03/05/2018 13:36   Dg Foot 2 Views Left  Result Date: 02/27/2018 Please see detailed radiograph report in office note.   Lab Data:  CBC: Recent Labs  Lab 03/02/18 0807 03/02/18 1445 03/03/18 0746 03/04/18 0727 03/05/18 0650 03/06/18 0547  WBC 51.8*  --  42.6* 52.8* 66.0* 58.1*  NEUTROABS  --  41.3* 40.0* 47.7* 59.5* 51.1*  HGB 8.9*  --  9.8* 9.3* 9.5* 9.4*  HCT 32.7*  --  35.6* 32.8* 34.2* 33.0*  MCV 79.8*  --  79.6* 79.8* 78.8* 78.9*  PLT 425*  --  370 427* 377 332   Basic Metabolic Panel: Recent Labs  Lab 02/28/18 1409  03/02/18 0920 03/03/18 0746 03/04/18 0500 03/05/18 0650 03/06/18 0547  NA 136   < > 133* 133*  134* 134* 132*  K 3.4*   < > 3.6 4.2 3.9 3.7 4.0  CL 97*   < > 95* 98 95* 93* 93*  CO2 28   < > 25 22 25 22  19*  GLUCOSE 139*   < > 140* 136* 169* 197* 156*  BUN 44*   < > 31* 19 29* 17 34*  CREATININE 7.26*   < > 5.70* 3.62* 4.88* 3.37* 4.40*  CALCIUM 8.2*   < > 8.2* 7.9* 8.0* 7.9* 7.7*  PHOS 3.5  --  3.0  --   --   --   --    < > = values in this interval not displayed.   GFR: Estimated Creatinine Clearance: 9.2 mL/min (A) (by C-G formula based on SCr of 4.4 mg/dL (H)). Liver Function  Tests: Recent Labs  Lab 02/28/18 1409 03/02/18 0920  ALBUMIN 2.2* 2.4*  CBG: Recent Labs  Lab 03/05/18 1124 03/05/18 1655 03/05/18 2132 03/06/18 0834 03/06/18 1220  GLUCAP 178* 172* 137* 144* 147*   Urine analysis:    Component Value Date/Time   COLORURINE YELLOW 12/17/2006 0005   APPEARANCEUR TURBID (A) 12/17/2006 0005   LABSPEC 1.019 12/17/2006 0005   PHURINE 5.0 12/17/2006 0005   GLUCOSEU NEGATIVE 12/17/2006 0005   HGBUR MODERATE (A) 12/17/2006 0005   BILIRUBINUR SMALL (A) 12/17/2006 0005   KETONESUR 15 (A) 12/17/2006 0005   PROTEINUR NEGATIVE 12/17/2006 0005   UROBILINOGEN 0.2 12/17/2006 0005   NITRITE NEGATIVE 12/17/2006 0005   LEUKOCYTESUR LARGE (A) 12/17/2006 0005     Kendell Bane M.D. [facility] 03/06/2018, 5:08 PM  Pager: [phone] Between 7am to 7pm - call Pager - [phone] After 7pm go to [email or web address] - password [email or web address] Call night coverage person covering after 7pm

## 2018-03-06 NOTE — Progress Notes (Signed)
Progress Note   Subjective  Patient reportedly had diarrhea yesterday evening and again this AM. On treatment for C diff however stool just sent this AM and pending.    Objective   Vital signs in last 24 hours: Temp:  [97.4 F (36.3 C)-98.2 F (36.8 C)] 98.2 F (36.8 C) (10/28 0828) Pulse Rate:  [76-104] 76 (10/28 0828) Resp:  [16-20] 18 (10/28 0828) BP: (95-120)/(47-70) 120/70 (10/28 0828) SpO2:  [94 %-100 %] 94 % (10/28 0610) Last BM Date: 03/05/18 General:    Ill appearing AA female in NAD Heart:  Regular rate and rhythm;  Lungs: Respirations even and unlabored anteriorly Abdomen:  Soft, nontender  Extremities:  Bilateral BKA DRE per Dr. Leone Payor yesterday Psych:  Cooperative. Normal mood and affect.  Intake/Output from previous day: 10/27 0701 - 10/28 0700 In: 320 [P.O.:120; IV Piggyback:200] Out: 0  Intake/Output this shift: No intake/output data recorded.  Lab Results: Recent Labs    03/04/18 0727 03/05/18 0650 03/06/18 0547  WBC 52.8* 66.0* 58.1*  HGB 9.3* 9.5* 9.4*  HCT 32.8* 34.2* 33.0*  PLT 427* 377 332   BMET Recent Labs    03/04/18 0500 03/05/18 0650 03/06/18 0547  NA 134* 134* 132*  K 3.9 3.7 4.0  CL 95* 93* 93*  CO2 25 22 19*  GLUCOSE 169* 197* 156*  BUN 29* 17 34*  CREATININE 4.88* 3.37* 4.40*  CALCIUM 8.0* 7.9* 7.7*   LFT No results for input(s): PROT, ALBUMIN, AST, ALT, ALKPHOS, BILITOT, BILIDIR, IBILI in the last 72 hours. PT/INR No results for input(s): LABPROT, INR in the last 72 hours.  Studies/Results: Ct Abdomen Pelvis Wo Contrast  Result Date: 03/05/2018 CLINICAL DATA:  Abdominal pain. Leukocytosis. Clinical concern for colitis. Dialysis patient. EXAM: CT ABDOMEN AND PELVIS WITHOUT CONTRAST TECHNIQUE: Multidetector CT imaging of the abdomen and pelvis was performed following the standard protocol without IV contrast. COMPARISON:  12/30/2006. FINDINGS: Lower chest: Dense coronary artery calcifications. Mild bilateral  lower lobe atelectasis. Small right pleural effusion. Hepatobiliary: Multiple gallstones in the gallbladder. The largest measures 1.9 cm. No gallbladder wall thickening or pericholecystic fluid. Diffuse arterial calcifications in the hepatic arteries. Otherwise, unremarkable liver. Pancreas: Unremarkable. No pancreatic ductal dilatation or surrounding inflammatory changes. Spleen: Normal in size without focal abnormality. Arterial calcifications. Adrenals/Urinary Tract: Normal appearing adrenal glands. Small kidneys. No significant urine in the urinary bladder. Unremarkable ureters. Stomach/Bowel: Scattered colonic diverticula. No evidence of diverticulitis or appendicitis. Pronounced low density concentric inferior rectal wall thickening with a maximum thickness of 1.9 cm on image number 85 series 3, with marked luminal narrowing, extending to the anus. On coronal image number 76, this has a mass-like appearance, measuring 4.5 x 4.5 cm . Unremarkable stomach and small bowel. Vascular/Lymphatic: Extensive arterial calcifications. No aneurysm or enlarged lymph nodes. Reproductive: Multiple small uterine fibroids. Normal appearing ovaries. Other: No abdominal wall hernia or abnormality. No abdominopelvic ascites. Musculoskeletal: Lumbar and lower thoracic spine degenerative changes. Diffuse osteopenia. IMPRESSION: 1. Pronounced low density concentric inferior rectal wall thickening with marked luminal narrowing, extending to the anus. This has a mass-like appearance. This is concerning for a primary rectal carcinoma or marked focal proctitis. 2. Cholelithiasis without evidence of cholecystitis. 3. Colonic diverticulosis. 4. Extensive atheromatous arterial calcifications, including the coronary arteries. 5. Small right pleural effusion. 6. Mild bilateral lower lobe atelectasis. Electronically Signed   By: Beckie Salts M.D.   On: 03/05/2018 13:36       Assessment / Plan:  84 y/o female ESRD on Eliquis,  bilateral lower extremity amputation, with abnormal CT scan of the rectum showing either mass versus proctitis / colitis. Dr. Leone Payor performed DRE yesterday which did not reveal any mass lesions. Given her marked WBC, C diff is a concern. She had liquid stool yesterday and this AM - sent for C diff testing. On Dificid and Flagyl (? Allergy to vancomycin), WBC slightly lower today. Would continue regimen, Eliquis has been resumed, await C diff stool test for now. If C diff positive would continue to treat and monitor course. If it's negative and leukocytosis persists can consider flex sig to further evaluate. Call with questions.  Ileene Patrick, MD [facility]

## 2018-03-07 DIAGNOSIS — K529 Noninfective gastroenteritis and colitis, unspecified: Secondary | ICD-10-CM

## 2018-03-07 DIAGNOSIS — Z89512 Acquired absence of left leg below knee: Secondary | ICD-10-CM

## 2018-03-07 LAB — CBC WITH DIFFERENTIAL/PLATELET
Band Neutrophils: 2 %
Basophils Absolute: 0 10*3/uL (ref 0.0–0.1)
Basophils Relative: 0 %
EOS PCT: 4 %
Eosinophils Absolute: 2 10*3/uL — ABNORMAL HIGH (ref 0.0–0.5)
HCT: 31.8 % — ABNORMAL LOW (ref 36.0–46.0)
Hemoglobin: 8.9 g/dL — ABNORMAL LOW (ref 12.0–15.0)
LYMPHS PCT: 4 %
Lymphs Abs: 2 10*3/uL (ref 0.7–4.0)
MCH: 21.5 pg — ABNORMAL LOW (ref 26.0–34.0)
MCHC: 28 g/dL — ABNORMAL LOW (ref 30.0–36.0)
MCV: 76.8 fL — ABNORMAL LOW (ref 80.0–100.0)
MONO ABS: 0 10*3/uL — AB (ref 0.1–1.0)
MYELOCYTES: 2 %
Monocytes Relative: 0 %
NEUTROS PCT: 88 %
NRBC: 1 /100{WBCs} — AB
Neutro Abs: 45 10*3/uL — ABNORMAL HIGH (ref 1.7–7.7)
PLATELETS: 296 10*3/uL (ref 150–400)
RBC: 4.14 MIL/uL (ref 3.87–5.11)
RDW: 20.7 % — ABNORMAL HIGH (ref 11.5–15.5)
WBC: 48.9 10*3/uL — AB (ref 4.0–10.5)
nRBC: 0.6 % — ABNORMAL HIGH (ref 0.0–0.2)

## 2018-03-07 LAB — BASIC METABOLIC PANEL
ANION GAP: 17 — AB (ref 5–15)
BUN: 57 mg/dL — ABNORMAL HIGH (ref 8–23)
CALCIUM: 7.6 mg/dL — AB (ref 8.9–10.3)
CO2: 23 mmol/L (ref 22–32)
Chloride: 94 mmol/L — ABNORMAL LOW (ref 98–111)
Creatinine, Ser: 5.33 mg/dL — ABNORMAL HIGH (ref 0.44–1.00)
GFR, EST AFRICAN AMERICAN: 8 mL/min — AB (ref >60)
GFR, EST NON AFRICAN AMERICAN: 7 mL/min — AB (ref >60)
Glucose, Bld: 159 mg/dL — ABNORMAL HIGH (ref 70–99)
Potassium: 3.4 mmol/L — ABNORMAL LOW (ref 3.5–5.1)
Sodium: 134 mmol/L — ABNORMAL LOW (ref 135–145)

## 2018-03-07 LAB — CULTURE, BLOOD (ROUTINE X 2)
CULTURE: NO GROWTH
Culture: NO GROWTH
SPECIAL REQUESTS: ADEQUATE
Special Requests: ADEQUATE

## 2018-03-07 LAB — GLUCOSE, CAPILLARY
Glucose-Capillary: 83 mg/dL (ref 70–99)
Glucose-Capillary: 139 mg/dL — ABNORMAL HIGH (ref 70–99)
Glucose-Capillary: 143 mg/dL — ABNORMAL HIGH (ref 70–99)

## 2018-03-07 LAB — PHOSPHORUS: Phosphorus: 2.1 mg/dL — ABNORMAL LOW (ref 2.5–4.6)

## 2018-03-07 LAB — ALBUMIN: Albumin: 2 g/dL — ABNORMAL LOW (ref 3.5–5.0)

## 2018-03-07 MED ORDER — HEPARIN SODIUM (PORCINE) 1000 UNIT/ML IJ SOLN
INTRAMUSCULAR | Status: AC
  Administered 2018-03-07: 3200 [IU] via INTRAVENOUS_CENTRAL
  Filled 2018-03-07: qty 4

## 2018-03-07 MED ORDER — SODIUM CHLORIDE 0.9 % IV SOLN: 100.0000 mL | INTRAVENOUS | Status: DC | PRN

## 2018-03-07 MED ORDER — ALTEPLASE 2 MG IJ SOLR: 2.0000 mg | Freq: Once | INTRAMUSCULAR | Status: DC | PRN

## 2018-03-07 MED ORDER — HEPARIN SODIUM (PORCINE) 1000 UNIT/ML DIALYSIS
5000.0000 [IU] | Freq: Once | INTRAMUSCULAR | Status: DC
  Administered 2018-03-07: 3200 [IU] via INTRAVENOUS_CENTRAL

## 2018-03-07 MED ORDER — DOXERCALCIFEROL 4 MCG/2ML IV SOLN
INTRAVENOUS | Status: AC
  Administered 2018-03-07: 1 ug via INTRAVENOUS
  Filled 2018-03-07: qty 2

## 2018-03-07 MED ORDER — LIDOCAINE-PRILOCAINE 2.5-2.5 % EX CREA: 1.0000 "application " | TOPICAL_CREAM | CUTANEOUS | Status: DC | PRN

## 2018-03-07 MED ORDER — HEPARIN SODIUM (PORCINE) 1000 UNIT/ML IJ SOLN
INTRAMUSCULAR | Status: AC
  Administered 2018-03-07: 5000 [IU] via INTRAVENOUS_CENTRAL
  Filled 2018-03-07: qty 5

## 2018-03-07 MED ORDER — LIDOCAINE HCL (PF) 1 % IJ SOLN: 5.0000 mL | INTRAMUSCULAR | Status: DC | PRN

## 2018-03-07 MED ORDER — DARBEPOETIN ALFA 60 MCG/0.3ML IJ SOSY
PREFILLED_SYRINGE | INTRAMUSCULAR | Status: AC
  Administered 2018-03-07: 60 ug via INTRAVENOUS
  Filled 2018-03-07: qty 0.3

## 2018-03-07 MED ORDER — HEPARIN SODIUM (PORCINE) 1000 UNIT/ML DIALYSIS
1000.0000 [IU] | INTRAMUSCULAR | Status: DC | PRN
  Administered 2018-03-07: 5000 [IU] via INTRAVENOUS_CENTRAL

## 2018-03-07 MED ORDER — PENTAFLUOROPROP-TETRAFLUOROETH EX AERO: 1.0000 "application " | INHALATION_SPRAY | CUTANEOUS | Status: DC | PRN

## 2018-03-07 NOTE — Progress Notes (Signed)
[facility] Progress Note   Subjective: C/Os of being hot. On HD tolerating well.   Objective Vitals:   03/07/18 0703 03/07/18 0730 03/07/18 0830 03/07/18 0900  BP: 113/60 (!) 114/52 (!) 99/46 (!) 123/45  Pulse: 84 81 100 (!) 51  Resp: 20 15 17 13   Temp:      TempSrc:      SpO2:   96%   Weight:      Height:       Physical Exam General: Chronically ill appearing elderly female in NAD Heart: S1,S2, RRR  Lungs: CTAB Abdomen: soft, NT Extremities: Bilateral BKA. Well healed R BKA, staples intact L BKA without drainage/edema along suture line.. UE edema noted-hands and forearms.  No stump edema.  Dialysis Access: [facility]    Additional Objective Labs: Basic Metabolic Panel: Recent Labs  Lab 02/28/18 1409  03/02/18 0920  03/05/18 0650 03/06/18 0547 03/07/18 0838  NA 136   < > 133*   < > 134* 132* 134*  K 3.4*   < > 3.6   < > 3.7 4.0 3.4*  CL 97*   < > 95*   < > 93* 93* 94*  CO2 28   < > 25   < > 22 19* 23  GLUCOSE 139*   < > 140*   < > 197* 156* 159*  BUN 44*   < > 31*   < > 17 34* 57*  CREATININE 7.26*   < > 5.70*   < > 3.37* 4.40* 5.33*  CALCIUM 8.2*   < > 8.2*   < > 7.9* 7.7* 7.6*  PHOS 3.5  --  3.0  --   --   --  2.1*   < > = values in this interval not displayed.   Liver Function Tests: Recent Labs  Lab 02/28/18 1409 03/02/18 0920 03/07/18 0838  ALBUMIN 2.2* 2.4* 2.0*   No results for input(s): LIPASE, AMYLASE in the last 168 hours. CBC: Recent Labs  Lab 03/03/18 0746 03/04/18 0727 03/05/18 0650 03/06/18 0547 03/07/18 0838  WBC 42.6* 52.8* 66.0* 58.1* 48.9*  NEUTROABS 40.0* 47.7* 59.5* 51.1* PENDING  HGB 9.8* 9.3* 9.5* 9.4* 8.9*  HCT 35.6* 32.8* 34.2* 33.0* 31.8*  MCV 79.6* 79.8* 78.8* 78.9* 76.8*  PLT 370 427* 377 332 296   Blood Culture    Component Value Date/Time   SDES BLOOD LEFT ANTECUBITAL 03/02/2018 1444   SPECREQUEST  03/02/2018 1444    BOTTLES DRAWN AEROBIC ONLY Blood Culture adequate volume   CULT   03/02/2018 1444    NO GROWTH 4 DAYS Performed at [facility], [address] [place], [place] [address]    REPTSTATUS PENDING 03/02/2018 1444    Cardiac Enzymes: No results for input(s): CKTOTAL, CKMB, CKMBINDEX, TROPONINI in the last 168 hours. CBG: Recent Labs  Lab 03/05/18 2132 03/06/18 0834 03/06/18 1220 03/06/18 1814 03/06/18 2136  GLUCAP 137* 144* 147* 157* 140*   Iron Studies: No results for input(s): IRON, TIBC, TRANSFERRIN, FERRITIN in the last 72 hours. @lablastinr3 @ Studies/Results: Ct Abdomen Pelvis Wo Contrast  Result Date: 03/05/2018 CLINICAL DATA:  Abdominal pain. Leukocytosis. Clinical concern for colitis. Dialysis patient. EXAM: CT ABDOMEN AND PELVIS WITHOUT CONTRAST TECHNIQUE: Multidetector CT imaging of the abdomen and pelvis was performed following the standard protocol without IV contrast. COMPARISON:  12/30/2006. FINDINGS: Lower chest: Dense coronary artery calcifications. Mild bilateral lower lobe atelectasis. Small right pleural effusion. Hepatobiliary: Multiple gallstones in the gallbladder. The largest measures  1.9 cm. No gallbladder wall thickening or pericholecystic fluid. Diffuse arterial calcifications in the hepatic arteries. Otherwise, unremarkable liver. Pancreas: Unremarkable. No pancreatic ductal dilatation or surrounding inflammatory changes. Spleen: Normal in size without focal abnormality. Arterial calcifications. Adrenals/Urinary Tract: Normal appearing adrenal glands. Small kidneys. No significant urine in the urinary bladder. Unremarkable ureters. Stomach/Bowel: Scattered colonic diverticula. No evidence of diverticulitis or appendicitis. Pronounced low density concentric inferior rectal wall thickening with a maximum thickness of 1.9 cm on image number 85 series 3, with marked luminal narrowing, extending to the anus. On coronal image number 76, this has a mass-like appearance, measuring 4.5 x 4.5 cm . Unremarkable stomach and small bowel.  Vascular/Lymphatic: Extensive arterial calcifications. No aneurysm or enlarged lymph nodes. Reproductive: Multiple small uterine fibroids. Normal appearing ovaries. Other: No abdominal wall hernia or abnormality. No abdominopelvic ascites. Musculoskeletal: Lumbar and lower thoracic spine degenerative changes. Diffuse osteopenia. IMPRESSION: 1. Pronounced low density concentric inferior rectal wall thickening with marked luminal narrowing, extending to the anus. This has a mass-like appearance. This is concerning for a primary rectal carcinoma or marked focal proctitis. 2. Cholelithiasis without evidence of cholecystitis. 3. Colonic diverticulosis. 4. Extensive atheromatous arterial calcifications, including the coronary arteries. 5. Small right pleural effusion. 6. Mild bilateral lower lobe atelectasis. Electronically Signed   By: Beckie Salts M.D.   On: 03/05/2018 13:36   Medications: . sodium chloride    . sodium chloride     . amiodarone  200 mg Oral BID  . apixaban  2.5 mg Oral BID  . Chlorhexidine Gluconate Cloth  6 each Topical Q0600  . cinacalcet  30 mg Oral Q supper  . darbepoetin (ARANESP) injection - DIALYSIS  60 mcg Intravenous Q Tue-HD  . digoxin  0.0625 mg Oral Daily  . doxercalciferol  1 mcg Intravenous Q T,Th,Sa-HD  . feeding supplement (NEPRO CARB STEADY)  237 mL Oral Q24H  . feeding supplement (PRO-STAT SUGAR FREE 64)  30 mL Oral BID  . gabapentin  100 mg Oral QHS  . heparin      . heparin  5,000 Units Dialysis Once in dialysis  . insulin aspart  0-5 Units Subcutaneous QHS  . insulin aspart  0-9 Units Subcutaneous TID WC  . midodrine  10 mg Oral BID WC  . multivitamin  1 tablet Oral QHS  . sevelamer carbonate  2,400 mg Oral TID WC     Dialysis Orders: [facility] T,Th,S 4 hrs 180NRe 400/autoflow 2.0 77 kg 2.0 K/2.25 Ca linear sodium, UFP 2 RIJ TDC -Heparin 5000 units IV TIW -Hectorol 1 mcg IV TIW -Mircera 100 mcg IV q 2 weeks (last dose 02/16/2018-last HGB 10.4  02/23/2018)  Assessment/Plan: 1. Gangrene L Heel - s/p L BKA 10/23 Dr. Randie Heinz. 2. Rising WBC - 43>53>66>58 today. ID consulting, BC negative. CT abdomen with rectal wall thickening to anus. Concern from primary rectal Ca vs acute proctitis. On metronidazole and fidaxomicin per ID recs. C Diff negative. Seen by GI this AM.  3. ESRD -on HD TTS. HD 03/07/18 on schedule. K+4.0. Resumed heparin with HD.  4. Anemia of CKD-Hgb 9.4. Aranesp qwk (tues) Follow trends.  5. Secondary hyperparathyroidism -Ca 7.6 C Ca 9.1 Phos 2.1Continue VDRA, hold binders, and continue sensipar. 6. Hypotension/volume -on midodrine. Does not appear volume overloaded on exam. Almost 2kg under EDW last HD. Continue to titrate down volume as tolerated. Will need new EDW at d/c. UFG 2.5 today.  7. Nutrition -Alb 2.0 Renal diet w/fluid restrictions. prostat, Renavite 8. DM -  per primary 9. Hx A fib on eliquis. Eliquis resumed. Per primary 10. Rash - on atarax 11. QOL-Palliative care consulted-has not seen pt yet. REMAINS FULL CODE.   Rita H. Brown NP-C 03/07/2018, 9:29 AM  [employer] [phone]

## 2018-03-07 NOTE — Progress Notes (Signed)
[facility]                                                                              Patient Demographics  [name], is a 84 y.o. female, DOB - 13-Nov-1932, ZOX:[id number]  Admit date - 02/27/2018   Admitting Physician Jonah Blue, MD  Outpatient Primary MD for the patient is Mila Palmer, MD  Outpatient specialists:   LOS - 8  days   Medical records reviewed and are as summarized below:    Chief Complaint  Patient presents with  . Wound Infection       Brief summary  84 y.o.femalewith medical history significant ofCVA; PAD s/p balloon angioplasty in 2/19 and 9/19 and R BKA in 7/19; obesity (BMI 32); hyperparathyroidism; DM; and ESRD on HD presenting with wound infection in the left foot heel.  Patient presented from skilled nursing facility.  Assessment & Plan    Principal Problem:   Gangrene (HCC) of left foot/heel, leukocytosis -Prior history of right BKA, patient had undergone angiography and angioplasty of the left peroneal artery in 01/2018 -Patient presented with gangrenous changes of the left heel area with toe, was placed placed on empiric antibiotics - underwent left BKA on 10/23 for the gangrene, stable from VS standpoint, wound clean. Stage II sacral pressure sores clean.  - Blood cultures remain negative, leukocytosis trending down now, 48.9   Rectal wall thickening, proctitis versus mass CT abdomen showed rectal wall thickening to anus with masslike appearance concerning for primary rectal cancer versus acute proctitis ID, Dr. Ilsa Iha recommended oral Dificid with IV metronidazole, then changed to oral vancomycin.  C. difficile negative ID, Dr Luciana Axe, today recommended to stop vancomycin and continue to monitor off antibiotics. GI following, pending goals of care, can consider flex sig tomorrow 10/30    Diabetes mellitus type II, with peripheral vascular disease (HCC) -CBG stable, continue sliding scale insulin  ESRD on  hemodialysis -HD TTS, nephrology following,     Anemia of chronic disease -Hemoglobin stable 8.9    Benign essential HTN -BP stable  History of atrial fibrillation on Eliquis Eliquis was restarted once cleared by vascular surgery, 2.5 mg twice a day    Stage 2 decubiti to buttock and sacral areas  - wound care per nursing    Code Status: Full CODE STATUS DVT Prophylaxis: Hold Eliquis Family Communication: Discussed in detail with the patient, all imaging results, lab results explained to the patient   Disposition Plan: Pending goals of care  Time Spent in minutes 25 minutes  Procedures:  Left BKA 10/23 Hemodialysis  Consultants:   Vascular surgery Nephrology Infectious disease, Dr. Ilsa Iha Palliative medicine GI  Antimicrobials:      Medications  Scheduled Meds: . amiodarone  200 mg Oral BID  . apixaban  2.5 mg Oral BID  . Chlorhexidine Gluconate Cloth  6 each Topical Q0600  . cinacalcet  30 mg Oral Q supper  . Darbepoetin Alfa      . darbepoetin (ARANESP) injection - DIALYSIS  60 mcg Intravenous Q Tue-HD  . digoxin  0.0625 mg Oral Daily  . doxercalciferol      . doxercalciferol  1 mcg Intravenous Q T,Th,Sa-HD  . feeding supplement (NEPRO CARB STEADY)  237 mL Oral Q24H  . feeding supplement (PRO-STAT SUGAR FREE 64)  30 mL Oral BID  . gabapentin  100 mg Oral QHS  . heparin      . heparin      . insulin aspart  0-5 Units Subcutaneous QHS  . insulin aspart  0-9 Units Subcutaneous TID WC  . midodrine  10 mg Oral BID WC  . multivitamin  1 tablet Oral QHS   Continuous Infusions:  PRN Meds:.acetaminophen **OR** acetaminophen, bisacodyl, calcium carbonate (dosed in mg elemental calcium), camphor-menthol **AND** hydrOXYzine, docusate sodium, ondansetron **OR** ondansetron (ZOFRAN) IV, oxyCODONE-acetaminophen, sodium phosphate, sorbitol, zolpidem   Antibiotics   Anti-infectives (From admission, onward)   Start     Dose/Rate Route Frequency Ordered Stop    03/06/18 1530  ciprofloxacin (CIPRO) IVPB 400 mg  Status:  Discontinued     400 mg 200 mL/hr over 60 Minutes Intravenous Every 24 hours 03/05/18 1536 03/05/18 1541   03/06/18 1400  vancomycin (VANCOCIN) 50 mg/mL oral solution 125 mg  Status:  Discontinued     125 mg Oral 4 times daily 03/06/18 1220 03/06/18 1358   03/05/18 2200  fidaxomicin (DIFICID) tablet 200 mg  Status:  Discontinued     200 mg Oral 2 times daily 03/05/18 1545 03/06/18 1220   03/05/18 1530  metroNIDAZOLE (FLAGYL) IVPB 500 mg  Status:  Discontinued     500 mg 100 mL/hr over 60 Minutes Intravenous Every 8 hours 03/05/18 1524 03/06/18 1220   03/05/18 1530  ciprofloxacin (CIPRO) IVPB 400 mg  Status:  Discontinued     400 mg 200 mL/hr over 60 Minutes Intravenous Every 12 hours 03/05/18 1524 03/05/18 1536   03/02/18 1600  piperacillin-tazobactam (ZOSYN) IVPB 3.375 g  Status:  Discontinued     3.375 g 12.5 mL/hr over 240 Minutes Intravenous Every 12 hours 03/02/18 0947 03/05/18 0617   03/02/18 1600  linezolid (ZYVOX) IVPB 600 mg  Status:  Discontinued     600 mg 300 mL/hr over 60 Minutes Intravenous Every 12 hours 03/02/18 0947 03/02/18 1315   03/02/18 1600  linezolid (ZYVOX) IVPB 600 mg  Status:  Discontinued     600 mg 300 mL/hr over 60 Minutes Intravenous Every 12 hours 03/02/18 1315 03/05/18 0617   02/27/18 1600  piperacillin-tazobactam (ZOSYN) IVPB 2.25 g  Status:  Discontinued     2.25 g 100 mL/hr over 30 Minutes Intravenous Every 8 hours 02/27/18 1527 02/27/18 1544   02/27/18 1600  linezolid (ZYVOX) IVPB 600 mg  Status:  Discontinued     600 mg 300 mL/hr over 60 Minutes Intravenous Every 12 hours 02/27/18 1537 03/02/18 0947   02/27/18 1600  piperacillin-tazobactam (ZOSYN) IVPB 3.375 g  Status:  Discontinued     3.375 g 12.5 mL/hr over 240 Minutes Intravenous Every 12 hours 02/27/18 1544 03/02/18 0947        Subjective:   [name] was seen and examined today during hemodialysis.  Appeared somewhat  lethargic but easily arousable and oriented.  Denied any specific complaints.  No fevers or chills.  No chest pain or shortness of breath, nausea or vomiting.  Objective:   Vitals:   03/07/18 1108 03/07/18 1203 03/07/18 1204 03/07/18 1650  BP: (!) 97/36  (!) 96/45 117/61  Pulse: (!) 40 78 82 79  Resp: 12  17 17   Temp: 97.9 F (36.6 C)  97.8 F (36.6 C)   TempSrc: Oral  Oral   SpO2: 96%  98% 93%  Weight:      Height:        Intake/Output Summary (Last 24 hours) at 03/07/2018 1719 Last data filed at 03/07/2018 1405 Gross per 24 hour  Intake 60 ml  Output 1685 ml  Net -1625 ml     Wt Readings from Last 3 Encounters:  03/06/18 75.2 kg  02/27/18 76.9 kg  02/24/18 76.9 kg      Exam  Physical Exam  General:, sick appearing but oriented  Eyes:   HEENT:    Cardiovascular: S1 S2 auscultated, Regular rate and rhythm.   Respiratory: Clear to auscultation bilaterally, no wheezing, rales or rhonchi  Gastrointestinal: Soft, nontender, nondistended, + bowel sounds  Ext: left BKA, wound clean, old right BKA stump clean  Neuro:  Musculoskeletal: No digital cyanosis, clubbing  Skin: Stage II sacral pressure sores  Psych: flat affect, appears to be somewhat withdrawn    Data Reviewed:  I have personally reviewed following labs and imaging studies  Micro Results Recent Results (from the past 240 hour(s))  Blood Cultures x 2 sites     Status: None   Collection Time: 02/27/18  5:15 PM  Result Value Ref Range Status   Specimen Description BLOOD RIGHT ANTECUBITAL  Final   Special Requests   Final    BOTTLES DRAWN AEROBIC AND ANAEROBIC Blood Culture adequate volume   Culture   Final    NO GROWTH 5 DAYS Performed at [facility], [address] [place], [place] [address]    Report Status 03/04/2018 FINAL  Final  Blood Cultures x 2 sites     Status: None   Collection Time: 02/27/18  7:10 PM  Result Value Ref Range Status   Specimen Description BLOOD RIGHT  FOREARM  Final   Special Requests   Final    BOTTLES DRAWN AEROBIC ONLY Blood Culture adequate volume   Culture   Final    NO GROWTH 5 DAYS Performed at [facility], [address], [place], [place] [address]    Report Status 03/04/2018 FINAL  Final  MRSA PCR Screening     Status: None   Collection Time: 02/27/18 11:04 PM  Result Value Ref Range Status   MRSA by PCR NEGATIVE NEGATIVE Final    Comment:        The GeneXpert MRSA Assay (FDA approved for NASAL specimens only), is one component of a comprehensive MRSA colonization surveillance program. It is not intended to diagnose MRSA infection nor to guide or monitor treatment for MRSA infections. Performed at [facility], [address], [place], [place] [address]   Culture, blood (routine x 2)     Status: None   Collection Time: 03/02/18  2:30 PM  Result Value Ref Range Status   Specimen Description BLOOD LEFT ANTECUBITAL  Final   Special Requests   Final    BOTTLES DRAWN AEROBIC ONLY Blood Culture adequate volume   Culture   Final    NO GROWTH 5 DAYS Performed at [facility], [address], [place], [place] [address]    Report Status 03/07/2018 FINAL  Final  Culture, blood (routine x 2)     Status: None   Collection Time: 03/02/18  2:44 PM  Result Value Ref Range Status   Specimen Description BLOOD LEFT ANTECUBITAL  Final   Special Requests   Final    BOTTLES DRAWN AEROBIC ONLY Blood Culture adequate volume  Culture   Final    NO GROWTH 5 DAYS Performed at [facility], [address], [place], [place] [address]    Report Status 03/07/2018 FINAL  Final  C difficile quick scan w PCR reflex     Status: None   Collection Time: 03/05/18  5:18 PM  Result Value Ref Range Status   C Diff antigen NEGATIVE NEGATIVE Final   C Diff toxin NEGATIVE NEGATIVE Final   C Diff interpretation No C. difficile detected.  Final    Radiology Reports Ct Abdomen Pelvis Wo Contrast  Result Date:  03/05/2018 CLINICAL DATA:  Abdominal pain. Leukocytosis. Clinical concern for colitis. Dialysis patient. EXAM: CT ABDOMEN AND PELVIS WITHOUT CONTRAST TECHNIQUE: Multidetector CT imaging of the abdomen and pelvis was performed following the standard protocol without IV contrast. COMPARISON:  12/30/2006. FINDINGS: Lower chest: Dense coronary artery calcifications. Mild bilateral lower lobe atelectasis. Small right pleural effusion. Hepatobiliary: Multiple gallstones in the gallbladder. The largest measures 1.9 cm. No gallbladder wall thickening or pericholecystic fluid. Diffuse arterial calcifications in the hepatic arteries. Otherwise, unremarkable liver. Pancreas: Unremarkable. No pancreatic ductal dilatation or surrounding inflammatory changes. Spleen: Normal in size without focal abnormality. Arterial calcifications. Adrenals/Urinary Tract: Normal appearing adrenal glands. Small kidneys. No significant urine in the urinary bladder. Unremarkable ureters. Stomach/Bowel: Scattered colonic diverticula. No evidence of diverticulitis or appendicitis. Pronounced low density concentric inferior rectal wall thickening with a maximum thickness of 1.9 cm on image number 85 series 3, with marked luminal narrowing, extending to the anus. On coronal image number 76, this has a mass-like appearance, measuring 4.5 x 4.5 cm . Unremarkable stomach and small bowel. Vascular/Lymphatic: Extensive arterial calcifications. No aneurysm or enlarged lymph nodes. Reproductive: Multiple small uterine fibroids. Normal appearing ovaries. Other: No abdominal wall hernia or abnormality. No abdominopelvic ascites. Musculoskeletal: Lumbar and lower thoracic spine degenerative changes. Diffuse osteopenia. IMPRESSION: 1. Pronounced low density concentric inferior rectal wall thickening with marked luminal narrowing, extending to the anus. This has a mass-like appearance. This is concerning for a primary rectal carcinoma or marked focal proctitis.  2. Cholelithiasis without evidence of cholecystitis. 3. Colonic diverticulosis. 4. Extensive atheromatous arterial calcifications, including the coronary arteries. 5. Small right pleural effusion. 6. Mild bilateral lower lobe atelectasis. Electronically Signed   By: Beckie Salts M.D.   On: 03/05/2018 13:36   Dg Foot 2 Views Left  Result Date: 02/27/2018 Please see detailed radiograph report in office note.   Lab Data:  CBC: Recent Labs  Lab 03/03/18 0746 03/04/18 0727 03/05/18 0650 03/06/18 0547 03/07/18 0838  WBC 42.6* 52.8* 66.0* 58.1* 48.9*  NEUTROABS 40.0* 47.7* 59.5* 51.1* 45.0*  HGB 9.8* 9.3* 9.5* 9.4* 8.9*  HCT 35.6* 32.8* 34.2* 33.0* 31.8*  MCV 79.6* 79.8* 78.8* 78.9* 76.8*  PLT 370 427* 377 332 296   Basic Metabolic Panel: Recent Labs  Lab 03/02/18 0920 03/03/18 0746 03/04/18 0500 03/05/18 0650 03/06/18 0547 03/07/18 0838  NA 133* 133* 134* 134* 132* 134*  K 3.6 4.2 3.9 3.7 4.0 3.4*  CL 95* 98 95* 93* 93* 94*  CO2 25 22 25 22  19* 23  GLUCOSE 140* 136* 169* 197* 156* 159*  BUN 31* 19 29* 17 34* 57*  CREATININE 5.70* 3.62* 4.88* 3.37* 4.40* 5.33*  CALCIUM 8.2* 7.9* 8.0* 7.9* 7.7* 7.6*  PHOS 3.0  --   --   --   --  2.1*   GFR: Estimated Creatinine Clearance: 7.6 mL/min (A) (by C-G formula based on SCr of 5.33 mg/dL (  H)). Liver Function Tests: Recent Labs  Lab 03/02/18 0920 03/07/18 0838  ALBUMIN 2.4* 2.0*   No results for input(s): LIPASE, AMYLASE in the last 168 hours. No results for input(s): AMMONIA in the last 168 hours. Coagulation Profile: No results for input(s): INR, PROTIME in the last 168 hours. Cardiac Enzymes: No results for input(s): CKTOTAL, CKMB, CKMBINDEX, TROPONINI in the last 168 hours. BNP (last 3 results) No results for input(s): PROBNP in the last 8760 hours. HbA1C: No results for input(s): HGBA1C in the last 72 hours. CBG: Recent Labs  Lab 03/06/18 1220 03/06/18 1814 03/06/18 2136 03/07/18 1206 03/07/18 1648  GLUCAP  147* 157* 140* 83 139*   Lipid Profile: No results for input(s): CHOL, HDL, LDLCALC, TRIG, CHOLHDL, LDLDIRECT in the last 72 hours. Thyroid Function Tests: No results for input(s): TSH, T4TOTAL, FREET4, T3FREE, THYROIDAB in the last 72 hours. Anemia Panel: No results for input(s): VITAMINB12, FOLATE, FERRITIN, TIBC, IRON, RETICCTPCT in the last 72 hours. Urine analysis:    Component Value Date/Time   COLORURINE YELLOW 12/17/2006 0005   APPEARANCEUR TURBID (A) 12/17/2006 0005   LABSPEC 1.019 12/17/2006 0005   PHURINE 5.0 12/17/2006 0005   GLUCOSEU NEGATIVE 12/17/2006 0005   HGBUR MODERATE (A) 12/17/2006 0005   BILIRUBINUR SMALL (A) 12/17/2006 0005   KETONESUR 15 (A) 12/17/2006 0005   PROTEINUR NEGATIVE 12/17/2006 0005   UROBILINOGEN 0.2 12/17/2006 0005   NITRITE NEGATIVE 12/17/2006 0005   LEUKOCYTESUR LARGE (A) 12/17/2006 0005     Kamel Haven M.D. [facility] 03/07/2018, 5:19 PM  Pager: [phone] Between 7am to 7pm - call Pager - [phone]  After 7pm go to [email or web address] - password [email or web address]  Call night coverage person covering after 7pm

## 2018-03-07 NOTE — Progress Notes (Signed)
PT Cancellation Note  Patient Details Name: [name] MRN: [id number] DOB: May 07, 1933   Cancelled Treatment:    Reason Eval/Treat Not Completed: Patient at procedure or test/unavailable; patient in HD currently.  Will check back another day.   Elray Mcgregor 03/07/2018, 10:19 AM  Sheran Lawless, PT Acute Rehabilitation Services [phone] 03/07/2018

## 2018-03-07 NOTE — Progress Notes (Signed)
PALLIATIVE NOTE:  Referral received for goals of care discussion. I introduced myself and Palliative to patient. She recently returned from HD. Patient states she is tired and "just needs to rest" She has covers pulled over her head. Also requesting additional blanket as she is cold. Blanket given. Patient is A&Ox3. I asked if there was a POA or family member I could call and speak with. Patient requested for me to contact her niece [name].   I spoke with [facility] over the phone and again introduced myself and Palliative. Myrtle confirmed she was patient's POA. She informed me she was unable to meet today as she and patient's son is trying to clean out her apartment. She has to have everything out of it by Thursday to avoid patient being charged for next month's rent. Niece/POA has requested to meet tomorrow 10/30 @ 0800 for GOC discussion.   Detailed note and recommendations to follow GOC meeting tomorrow.   Thank you for your referral.   Willette Alma, AGPCNP-BC Palliative Medicine Team  Phone: [phone] Fax: [phone] Pager: [phone] Amion: [place]. [place]

## 2018-03-07 NOTE — H&P (View-Only) (Signed)
    Progress Note   Subjective  Patient in HD this AM. WBC has downtrended although C Diff testing was negative. She denies abdominal pain this AM   Objective   Vital signs in last 24 hours: Temp:  [97.4 F (36.3 C)-98.2 F (36.8 C)] 98.2 F (36.8 C) (10/29 0655) Pulse Rate:  [51-100] 51 (10/29 0900) Resp:  [13-20] 13 (10/29 0900) BP: (92-123)/(45-73) 123/45 (10/29 0900) SpO2:  [93 %-100 %] 96 % (10/29 0830) Weight:  [75.2 kg] 75.2 kg (10/28 2100) Last BM Date: 03/07/18 General:    AA female in NAD Heart:  Regular rate and rhythm; Lungs: Respirations even and unlabored,  Abdomen:  Soft, nontender and nondistended. Extremities:  Left BKA wound c/d/i, R BKA healthy appearing Psych:  Cooperative. Normal mood and affect.  Intake/Output from previous day: 10/28 0701 - 10/29 0700 In: 60 [P.O.:60] Out: 0  Intake/Output this shift: No intake/output data recorded.  Lab Results: Recent Labs    03/05/18 0650 03/06/18 0547 03/07/18 0838  WBC 66.0* 58.1* 48.9*  HGB 9.5* 9.4* 8.9*  HCT 34.2* 33.0* 31.8*  PLT 377 332 296   BMET Recent Labs    03/05/18 0650 03/06/18 0547 03/07/18 0838  NA 134* 132* 134*  K 3.7 4.0 3.4*  CL 93* 93* 94*  CO2 22 19* 23  GLUCOSE 197* 156* 159*  BUN 17 34* 57*  CREATININE 3.37* 4.40* 5.33*  CALCIUM 7.9* 7.7* 7.6*   LFT Recent Labs    03/07/18 0838  ALBUMIN 2.0*   PT/INR No results for input(s): LABPROT, INR in the last 72 hours.  Studies/Results: Ct Abdomen Pelvis Wo Contrast  Result Date: 03/05/2018 CLINICAL DATA:  Abdominal pain. Leukocytosis. Clinical concern for colitis. Dialysis patient. EXAM: CT ABDOMEN AND PELVIS WITHOUT CONTRAST TECHNIQUE: Multidetector CT imaging of the abdomen and pelvis was performed following the standard protocol without IV contrast. COMPARISON:  12/30/2006. FINDINGS: Lower chest: Dense coronary artery calcifications. Mild bilateral lower lobe atelectasis. Small right pleural effusion.  Hepatobiliary: Multiple gallstones in the gallbladder. The largest measures 1.9 cm. No gallbladder wall thickening or pericholecystic fluid. Diffuse arterial calcifications in the hepatic arteries. Otherwise, unremarkable liver. Pancreas: Unremarkable. No pancreatic ductal dilatation or surrounding inflammatory changes. Spleen: Normal in size without focal abnormality. Arterial calcifications. Adrenals/Urinary Tract: Normal appearing adrenal glands. Small kidneys. No significant urine in the urinary bladder. Unremarkable ureters. Stomach/Bowel: Scattered colonic diverticula. No evidence of diverticulitis or appendicitis. Pronounced low density concentric inferior rectal wall thickening with a maximum thickness of 1.9 cm on image number 85 series 3, with marked luminal narrowing, extending to the anus. On coronal image number 76, this has a mass-like appearance, measuring 4.5 x 4.5 cm . Unremarkable stomach and small bowel. Vascular/Lymphatic: Extensive arterial calcifications. No aneurysm or enlarged lymph nodes. Reproductive: Multiple small uterine fibroids. Normal appearing ovaries. Other: No abdominal wall hernia or abnormality. No abdominopelvic ascites. Musculoskeletal: Lumbar and lower thoracic spine degenerative changes. Diffuse osteopenia. IMPRESSION: 1. Pronounced low density concentric inferior rectal wall thickening with marked luminal narrowing, extending to the anus. This has a mass-like appearance. This is concerning for a primary rectal carcinoma or marked focal proctitis. 2. Cholelithiasis without evidence of cholecystitis. 3. Colonic diverticulosis. 4. Extensive atheromatous arterial calcifications, including the coronary arteries. 5. Small right pleural effusion. 6. Mild bilateral lower lobe atelectasis. Electronically Signed   By: Steven  Reid M.D.   On: 03/05/2018 13:36       Assessment / Plan:   85 y/o female ESRD on   Eliquis, bilateral lower extremity amputation, with abnormal CT scan of  the rectum showing either mass versus proctitis / colitis. Dr. Gessner previously performed DRE which did not reveal any mass lesions. Given her marked WBC, C diff a high concern. Previously on IV dificid and flagyl due to ? vancomycin allergy, was switched to PO vancomycin yesterday although her C diff test came back negative, unclear if treatment could have influenced that result? Her WBC is downtrending since empiric treatment for C diff.   Mass lesion seems unlikely given rectal exam, and the patient is improved with antibiotics thus far. I discussed the situation with the patient's niece, Myrtle Harris - she does not think the patient has ever had a prior colonoscopy, and she would want flex sig done to evaluate this to definitively rule out malignancy which I think the patient would tolerate. There is a pending palliative care meeting perhaps later today, will allow them to discuss goals of care. If Myrtle still wants to have full scope of care provided can consider flex sig tomorrow - she can consent for the patient. I would keep empiric oral vancomycin on board until that time. Would keep NPO after MN. Will assess her in the AM.    Steven Armbruster, MD [facility]   

## 2018-03-07 NOTE — Progress Notes (Signed)
[facility]   Reason for visit: Follow up on leukocytosis  Interval History: decreasing WBC, negative C dif, no new issues.  Tired, no fever. Starting goals of care discussion; + diarrhea    Physical Exam: Constitutional:  Vitals:   03/07/18 1203 03/07/18 1204  BP:  (!) 96/45  Pulse: 78 82  Resp:  17  Temp:  97.8 F (36.6 C)  SpO2:  98%   patient appears in NAD, blanket over head Respiratory: Normal respiratory effort; CTA B Cardiovascular: RRR GI: soft, nt, nd  Review of Systems: Constitutional: negative for fevers and chills Integument/breast: negative for rash  Lab Results  Component Value Date   WBC 48.9 (H) 03/07/2018   HGB 8.9 (L) 03/07/2018   HCT 31.8 (L) 03/07/2018   MCV 76.8 (L) 03/07/2018   PLT 296 03/07/2018    Lab Results  Component Value Date   CREATININE 5.33 (H) 03/07/2018   BUN 57 (H) 03/07/2018   NA 134 (L) 03/07/2018   K 3.4 (L) 03/07/2018   CL 94 (L) 03/07/2018   CO2 23 03/07/2018    Lab Results  Component Value Date   ALT 11 02/27/2018   AST 19 02/27/2018   ALKPHOS 114 02/27/2018     Microbiology: Recent Results (from the past 240 hour(s))  Blood Cultures x 2 sites     Status: None   Collection Time: 02/27/18  5:15 PM  Result Value Ref Range Status   Specimen Description BLOOD RIGHT ANTECUBITAL  Final   Special Requests   Final    BOTTLES DRAWN AEROBIC AND ANAEROBIC Blood Culture adequate volume   Culture   Final    NO GROWTH 5 DAYS Performed at [facility], [address] [place], [place] [address]    Report Status 03/04/2018 FINAL  Final  Blood Cultures x 2 sites     Status: None   Collection Time: 02/27/18  7:10 PM  Result Value Ref Range Status   Specimen Description BLOOD RIGHT FOREARM  Final   Special Requests   Final    BOTTLES DRAWN AEROBIC ONLY Blood Culture adequate volume   Culture   Final    NO GROWTH 5 DAYS Performed at [facility], [address] [place],  [place] [address]    Report Status 03/04/2018 FINAL  Final  MRSA PCR Screening     Status: None   Collection Time: 02/27/18 11:04 PM  Result Value Ref Range Status   MRSA by PCR NEGATIVE NEGATIVE Final    Comment:        The GeneXpert MRSA Assay (FDA approved for NASAL specimens only), is one component of a comprehensive MRSA colonization surveillance program. It is not intended to diagnose MRSA infection nor to guide or monitor treatment for MRSA infections. Performed at [facility], [address] [place], [place] [address]   Culture, blood (routine x 2)     Status: None   Collection Time: 03/02/18  2:30 PM  Result Value Ref Range Status   Specimen Description BLOOD LEFT ANTECUBITAL  Final   Special Requests   Final    BOTTLES DRAWN AEROBIC ONLY Blood Culture adequate volume   Culture   Final    NO GROWTH 5 DAYS Performed at [facility], [address], [place], [place] [address]    Report Status 03/07/2018 FINAL  Final  Culture, blood (routine x 2)     Status: None  Collection Time: 03/02/18  2:44 PM  Result Value Ref Range Status   Specimen Description BLOOD LEFT ANTECUBITAL  Final   Special Requests   Final    BOTTLES DRAWN AEROBIC ONLY Blood Culture adequate volume   Culture   Final    NO GROWTH 5 DAYS Performed at [facility], [address], [place], [place] [address]    Report Status 03/07/2018 FINAL  Final  C difficile quick scan w PCR reflex     Status: None   Collection Time: 03/05/18  5:18 PM  Result Value Ref Range Status   C Diff antigen NEGATIVE NEGATIVE Final   C Diff toxin NEGATIVE NEGATIVE Final   C Diff interpretation No C. difficile detected.  Final    Impression/Plan:  1. Colitis - C diff negative.  I would not anticipate a false negative result due to oral vancomycin administration prior to test.   Stop vancomycin  2.  Leukocytosis - improved off of antibiotics.  Started after BKA.  C/w transient process.  Continue to monitor  off of antibiotics.  I would anticipate continued decrease of leukocytosis over next 1-2 weeks.    3.  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 Treated.  No further antibiotics indicated.    Will sign off, call with questions.

## 2018-03-07 NOTE — Progress Notes (Signed)
Progress Note   Subjective  Patient in HD this AM. WBC has downtrended although C Diff testing was negative. She denies abdominal pain this AM   Objective   Vital signs in last 24 hours: Temp:  [97.4 F (36.3 C)-98.2 F (36.8 C)] 98.2 F (36.8 C) (10/29 0655) Pulse Rate:  [51-100] 51 (10/29 0900) Resp:  [13-20] 13 (10/29 0900) BP: (92-123)/(45-73) 123/45 (10/29 0900) SpO2:  [93 %-100 %] 96 % (10/29 0830) Weight:  [75.2 kg] 75.2 kg (10/28 2100) Last BM Date: 03/07/18 General:    AA female in NAD Heart:  Regular rate and rhythm; Lungs: Respirations even and unlabored,  Abdomen:  Soft, nontender and nondistended. Extremities:  Left BKA wound c/d/i, R BKA healthy appearing Psych:  Cooperative. Normal mood and affect.  Intake/Output from previous day: 10/28 0701 - 10/29 0700 In: 60 [P.O.:60] Out: 0  Intake/Output this shift: No intake/output data recorded.  Lab Results: Recent Labs    03/05/18 0650 03/06/18 0547 03/07/18 0838  WBC 66.0* 58.1* 48.9*  HGB 9.5* 9.4* 8.9*  HCT 34.2* 33.0* 31.8*  PLT 377 332 296   BMET Recent Labs    03/05/18 0650 03/06/18 0547 03/07/18 0838  NA 134* 132* 134*  K 3.7 4.0 3.4*  CL 93* 93* 94*  CO2 22 19* 23  GLUCOSE 197* 156* 159*  BUN 17 34* 57*  CREATININE 3.37* 4.40* 5.33*  CALCIUM 7.9* 7.7* 7.6*   LFT Recent Labs    03/07/18 0838  ALBUMIN 2.0*   PT/INR No results for input(s): LABPROT, INR in the last 72 hours.  Studies/Results: Ct Abdomen Pelvis Wo Contrast  Result Date: 03/05/2018 CLINICAL DATA:  Abdominal pain. Leukocytosis. Clinical concern for colitis. Dialysis patient. EXAM: CT ABDOMEN AND PELVIS WITHOUT CONTRAST TECHNIQUE: Multidetector CT imaging of the abdomen and pelvis was performed following the standard protocol without IV contrast. COMPARISON:  12/30/2006. FINDINGS: Lower chest: Dense coronary artery calcifications. Mild bilateral lower lobe atelectasis. Small right pleural effusion.  Hepatobiliary: Multiple gallstones in the gallbladder. The largest measures 1.9 cm. No gallbladder wall thickening or pericholecystic fluid. Diffuse arterial calcifications in the hepatic arteries. Otherwise, unremarkable liver. Pancreas: Unremarkable. No pancreatic ductal dilatation or surrounding inflammatory changes. Spleen: Normal in size without focal abnormality. Arterial calcifications. Adrenals/Urinary Tract: Normal appearing adrenal glands. Small kidneys. No significant urine in the urinary bladder. Unremarkable ureters. Stomach/Bowel: Scattered colonic diverticula. No evidence of diverticulitis or appendicitis. Pronounced low density concentric inferior rectal wall thickening with a maximum thickness of 1.9 cm on image number 85 series 3, with marked luminal narrowing, extending to the anus. On coronal image number 76, this has a mass-like appearance, measuring 4.5 x 4.5 cm . Unremarkable stomach and small bowel. Vascular/Lymphatic: Extensive arterial calcifications. No aneurysm or enlarged lymph nodes. Reproductive: Multiple small uterine fibroids. Normal appearing ovaries. Other: No abdominal wall hernia or abnormality. No abdominopelvic ascites. Musculoskeletal: Lumbar and lower thoracic spine degenerative changes. Diffuse osteopenia. IMPRESSION: 1. Pronounced low density concentric inferior rectal wall thickening with marked luminal narrowing, extending to the anus. This has a mass-like appearance. This is concerning for a primary rectal carcinoma or marked focal proctitis. 2. Cholelithiasis without evidence of cholecystitis. 3. Colonic diverticulosis. 4. Extensive atheromatous arterial calcifications, including the coronary arteries. 5. Small right pleural effusion. 6. Mild bilateral lower lobe atelectasis. Electronically Signed   By: Beckie Salts M.D.   On: 03/05/2018 13:36       Assessment / Plan:   84 y/o female ESRD on  Eliquis, bilateral lower extremity amputation, with abnormal CT scan of  the rectum showing either mass versus proctitis / colitis. Dr. Leone Payor previously performed DRE which did not reveal any mass lesions. Given her marked WBC, C diff a high concern. Previously on IV dificid and flagyl due to ? vancomycin allergy, was switched to PO vancomycin yesterday although her C diff test came back negative, unclear if treatment could have influenced that result? Her WBC is downtrending since empiric treatment for C diff.   Mass lesion seems unlikely given rectal exam, and the patient is improved with antibiotics thus far. I discussed the situation with the patient's niece, [name] - she does not think the patient has ever had a prior colonoscopy, and she would want flex sig done to evaluate this to definitively rule out malignancy which I think the patient would tolerate. There is a pending palliative care meeting perhaps later today, will allow them to discuss goals of care. If [name] still wants to have full scope of care provided can consider flex sig tomorrow - she can consent for the patient. I would keep empiric oral vancomycin on board until that time. Would keep NPO after MN. Will assess her in the AM.    Ileene Patrick, MD [facility]

## 2018-03-08 ENCOUNTER — Encounter (HOSPITAL_BASED_OUTPATIENT_CLINIC_OR_DEPARTMENT_OTHER): Payer: Medicare Other | Attending: Internal Medicine

## 2018-03-08 ENCOUNTER — Encounter (HOSPITAL_COMMUNITY)
Admission: EM | Disposition: A | Discharge status: Home Health | Payer: Self-pay | Source: Home / Self Care | Attending: Internal Medicine

## 2018-03-08 ENCOUNTER — Encounter (HOSPITAL_COMMUNITY): Payer: Self-pay

## 2018-03-08 DIAGNOSIS — Z66 Do not resuscitate: Secondary | ICD-10-CM

## 2018-03-08 DIAGNOSIS — E1151 Type 2 diabetes mellitus with diabetic peripheral angiopathy without gangrene: Secondary | ICD-10-CM

## 2018-03-08 DIAGNOSIS — D638 Anemia in other chronic diseases classified elsewhere: Secondary | ICD-10-CM

## 2018-03-08 DIAGNOSIS — Z515 Encounter for palliative care: Secondary | ICD-10-CM

## 2018-03-08 DIAGNOSIS — I7025 Atherosclerosis of native arteries of other extremities with ulceration: Secondary | ICD-10-CM

## 2018-03-08 DIAGNOSIS — T148XXA Other injury of unspecified body region, initial encounter: Secondary | ICD-10-CM

## 2018-03-08 DIAGNOSIS — I10 Essential (primary) hypertension: Secondary | ICD-10-CM

## 2018-03-08 DIAGNOSIS — I96 Gangrene, not elsewhere classified: Secondary | ICD-10-CM

## 2018-03-08 DIAGNOSIS — Z7189 Other specified counseling: Secondary | ICD-10-CM

## 2018-03-08 DIAGNOSIS — N186 End stage renal disease: Secondary | ICD-10-CM

## 2018-03-08 DIAGNOSIS — L089 Local infection of the skin and subcutaneous tissue, unspecified: Secondary | ICD-10-CM

## 2018-03-08 DIAGNOSIS — Z992 Dependence on renal dialysis: Secondary | ICD-10-CM

## 2018-03-08 HISTORY — PX: FLEXIBLE SIGMOIDOSCOPY: SHX5431

## 2018-03-08 LAB — CBC WITH DIFFERENTIAL/PLATELET
BASOS ABS: 0.4 10*3/uL — AB (ref 0.0–0.1)
BASOS PCT: 1 %
EOS ABS: 1.8 10*3/uL — AB (ref 0.0–0.5)
EOS PCT: 4 %
HCT: 30.8 % — ABNORMAL LOW (ref 36.0–46.0)
Hemoglobin: 9.1 g/dL — ABNORMAL LOW (ref 12.0–15.0)
LYMPHS ABS: 3.1 10*3/uL (ref 0.7–4.0)
Lymphocytes Relative: 7 %
MCH: 22.5 pg — ABNORMAL LOW (ref 26.0–34.0)
MCHC: 29.5 g/dL — ABNORMAL LOW (ref 30.0–36.0)
MCV: 76 fL — AB (ref 80.0–100.0)
Monocytes Absolute: 1.8 10*3/uL — ABNORMAL HIGH (ref 0.1–1.0)
Monocytes Relative: 4 %
Myelocytes: 2 %
NRBC: 1.1 % — AB (ref 0.0–0.2)
Neutro Abs: 36.8 10*3/uL — ABNORMAL HIGH (ref 1.7–7.7)
Neutrophils Relative %: 81 %
PLATELETS: 214 10*3/uL (ref 150–400)
PROMYELOCYTES RELATIVE: 1 %
RBC: 4.05 MIL/uL (ref 3.87–5.11)
RDW: 20.8 % — ABNORMAL HIGH (ref 11.5–15.5)
WBC: 43.8 10*3/uL — ABNORMAL HIGH (ref 4.0–10.5)
nRBC: 4 /100 WBC — ABNORMAL HIGH

## 2018-03-08 LAB — GLUCOSE, CAPILLARY
GLUCOSE-CAPILLARY: 134 mg/dL — AB (ref 70–99)
Glucose-Capillary: 115 mg/dL — ABNORMAL HIGH (ref 70–99)
Glucose-Capillary: 144 mg/dL — ABNORMAL HIGH (ref 70–99)

## 2018-03-08 LAB — BASIC METABOLIC PANEL
Anion gap: 13 (ref 5–15)
BUN: 33 mg/dL — ABNORMAL HIGH (ref 8–23)
CHLORIDE: 96 mmol/L — AB (ref 98–111)
CO2: 23 mmol/L (ref 22–32)
Calcium: 7.7 mg/dL — ABNORMAL LOW (ref 8.9–10.3)
Creatinine, Ser: 3.31 mg/dL — ABNORMAL HIGH (ref 0.44–1.00)
GFR, EST AFRICAN AMERICAN: 14 mL/min — AB (ref >60)
GFR, EST NON AFRICAN AMERICAN: 12 mL/min — AB (ref >60)
Glucose, Bld: 154 mg/dL — ABNORMAL HIGH (ref 70–99)
Potassium: 3.3 mmol/L — ABNORMAL LOW (ref 3.5–5.1)
SODIUM: 132 mmol/L — AB (ref 135–145)

## 2018-03-08 SURGERY — SIGMOIDOSCOPY, FLEXIBLE
Anesthesia: Moderate Sedation

## 2018-03-08 MED ORDER — SODIUM CHLORIDE 0.9 % IV SOLN
INTRAVENOUS | Status: AC | PRN
  Administered 2018-03-08: 500 mL via INTRAVENOUS

## 2018-03-08 MED ORDER — MIDAZOLAM HCL 10 MG/2ML IJ SOLN
INTRAMUSCULAR | Status: DC | PRN
  Administered 2018-03-08: 1 mg via INTRAVENOUS

## 2018-03-08 MED ORDER — MIDAZOLAM HCL 5 MG/ML IJ SOLN
INTRAMUSCULAR | Status: AC
  Filled 2018-03-08: qty 1

## 2018-03-08 MED ORDER — FENTANYL CITRATE (PF) 100 MCG/2ML IJ SOLN
INTRAMUSCULAR | Status: AC
  Filled 2018-03-08: qty 2

## 2018-03-08 NOTE — Care Management Note (Addendum)
Case Management Note  Patient Details  Name: [name] MRN: [id number] Date of Birth: January 05, 1933  Subjective/Objective:                    Action/Plan:  Discussed discharge planning at bedside with patient's niece Eula Flax and Son Taye [phone] on speaker phone.   Family plan to take patient to Taye's address at discharge with family support 24 hours a day.   Taye's address is [address] [place] [address]   Offered choice for home health and palliative care at home.  Family would like [facility] for home health and Hospice and Palliative Care [facility] for Palliative Care.   Discussed DME with Eula Flax and Taye, they are in agreement with wheelchair with cushion, hospital bed with air mattress overlay and hoyer lift with amputee sling. Alwyn Ren is the person to contact for delivery.    Potential discharge is tomorrow 03-09-18.   Referral for home health and DME given to [facility] with [facility]. Lupita Leash will have DME delivered today for potential discharge tomorrow .   Referral for palliative care services given to [facility] at [facility].    Family requesting [facility] transportation home at time of discharge.  NCM will arrange same on discharge day.  DME ordered , Lupita Leash with [facility] asking if amputee sling is regular sling or U sling. Paged Vernona Rieger with PT . Vernona Rieger returned call, need regular sling, order updated and called [employer] DNR form placed on chart for MD signature.  Expected Discharge Date:                  Expected Discharge Plan:  Home w Home Health Services  In-House Referral:  Hospice / Palliative Care  Discharge planning Services  CM Consult  Post Acute Care Choice:  Home Health, Durable Medical Equipment Choice offered to:  Patient, Adult Children  DME Arranged:  Hospital bed, Lightweight manual wheelchair with seat cushion DME Agency:  [facility]  HH Arranged:  RN, Disease Management, PT, OT, Nurse's Aide, Social Work [employer] Agency:   [facility]  Status of Service:  In process, will continue to follow  If discussed at Long Length of Stay Meetings, dates discussed:    Additional Comments:  Kingsley Plan, RN 03/08/2018, 1:09 PM

## 2018-03-08 NOTE — Op Note (Signed)
[facility] Patient Name: [name] Procedure Date : 03/08/2018 MRN: [id number] Attending MD: Willaim Rayas. Adela Lank , MD Date of Birth: 09-02-1932 CSN: [id number] Age: 84 Admit Type: Inpatient Procedure:                Flexible Sigmoidoscopy Indications:              Abnormal CT of the GI tract - marked thickening of                            the distal rectum - rule out mass lesion / colitis Providers:                Willaim Rayas. Adela Lank, MD, Norman Clay, RN, Margo Aye, Technician Referring MD:              Medicines:                Midazolam 1 mg IV Complications:            No immediate complications. Estimated blood loss:                            None. Estimated Blood Loss:     Estimated blood loss: none. Procedure:                Pre-Anesthesia Assessment:                           - Prior to the procedure, a History and Physical                            was performed, and patient medications and                            allergies were reviewed. The patient's tolerance of                            previous anesthesia was also reviewed. The risks                            and benefits of the procedure and the sedation                            options and risks were discussed with the patient.                            All questions were answered, and informed consent                            was obtained. Prior Anticoagulants: The patient has                            taken Eliquis (apixaban), last dose was day of  procedure. ASA Grade Assessment: III - A patient                            with severe systemic disease. After reviewing the                            risks and benefits, the patient was deemed in                            satisfactory condition to undergo the procedure.                           After obtaining informed consent, the scope was                            passed  under direct vision. The [id number]-[id number] ([id number])                            Olympus Adult EGD was introduced through the anus                            and advanced to the the sigmoid colon. The flexible                            sigmoidoscopy was accomplished without difficulty.                            The patient tolerated the procedure well. The                            quality of the bowel preparation was poor. Scope In: Scope Out: Findings:      The perianal and digital rectal examinations were normal. Sacral decubiti      A 4 mm polyp was found in the rectum. The polyp was sessile. It was not       removed given the patient's ongoing Eliquis use      The prep was poor and lavage was performed to achieve adequate views.       The rectum and distal sigmoid colon appeared normal. No mass lesion. No       evidence of colitis. Impression:               - Preparation of the colon was poor.                           - One 4 mm polyp in the rectum. Not removed in the                            setting of anticoagulation                           - The rectum and distal sigmoid colon appeared                            normal -  no mass lesion or colitis appreciated. Recommendation:           - Return patient to hospital ward for ongoing care.                           - Resume previous diet.                           - Continue present medications.                           - No pathology noted on this exam                           - GI service will sign off at this time. She can                            follow up with [place] as outpatient if needed. Polyp is                            small, likely does not warrant removal given her                            comorbidities and life expectancy, can discuss if                            she wishes as outpatient in the future Procedure Code(s):        --- Professional ---                           [phone], Sigmoidoscopy, flexible; diagnostic,                             including collection of specimen(s) by brushing or                            washing, when performed (separate procedure) Diagnosis Code(s):        --- Professional ---                           K62.1, Rectal polyp                           R93.3, Abnormal findings on diagnostic imaging of                            other parts of digestive tract CPT copyright 2018 American [employer] Association. All rights reserved. The codes documented in this report are preliminary and upon coder review may  be revised to meet current compliance requirements. Viviann Spare P. Adela Lank, MD 03/08/2018 3:26:18 PM This report has been signed electronically. Number of Addenda: 0

## 2018-03-08 NOTE — Evaluation (Signed)
Occupational Therapy Evaluation Patient Details Name: [name] MRN: [id number] DOB: 1933/03/03 Today's Date: 03/08/2018    History of Present Illness This 84 y.o. female admitted from SNF presents with wound infection Lt foot heel with ongoing leukocytosis.  SHe underwent Lt BKA 03/01/18.  PMH includes s/p Rt BKA, CVA, PAD, s/p balloon angioplasty, DM, ESRD with HD    Clinical Impression   Pt continues to present with decreased arousal and activity tolerance. Pt performing bed mobility to sit at EOB with Max A +2. Pt applying lotion while sitting at EOB and requiring Max cues to engage in activity. Pt requiring Total A for peri care at bed level. Pt with several sacral wounds; RN present and placing foam. Also noting red dicoloration on right side of torso and skin peeling; RN aware. Continue to recommend follow up therapy and palliative consult and will continue to follow acutely as admitted.     Follow Up Recommendations  Home health OT;Supervision/Assistance - 24 hour;Other (comment)(Palliative Medicine Consult)    Equipment Recommendations  Wheelchair (measurements OT);Wheelchair cushion (measurements OT);Hospital bed;Other (comment)(Hoyer lift)    Recommendations for Other Services Other (comment)(Palliative Medicine Consult)     Precautions / Restrictions Precautions Precautions: Fall Precaution Comments: bilateral BKA Restrictions Weight Bearing Restrictions: No      Mobility Bed Mobility Overal bed mobility: Needs Assistance Bed Mobility: Rolling;Sidelying to Sit;Sit to Supine Rolling: Max assist;+2 for physical assistance;+2 for safety/equipment Sidelying to sit: Total assist;+2 for physical assistance;HOB elevated   Sit to supine: Total assist;+2 for physical assistance;+2 for safety/equipment   General bed mobility comments: Pt was able to roll L and R with use of rail. HHA provided at times for pt to have leverage. Total assist required for transition to/from  EOB.   Transfers                 General transfer comment: Deferred due to safety. - recommend Lift pad - had been working on sliding board transfers with prosthesis    Balance                                           ADL either performed or assessed with clinical judgement   ADL Overall ADL's : Needs assistance/impaired                     Lower Body Dressing: Maximal assistance;Total assistance Lower Body Dressing Details (indicate cue type and reason): Applied lotion to thighs while sitting at EOB with total A. Requiring Max cues to participate in task.     Toileting- Clothing Manipulation and Hygiene: Total assistance;Bed level Toileting - Clothing Manipulation Details (indicate cue type and reason): Total A and very painful due to socral wounds.       General ADL Comments: Pt applying lotion to bilateral thighs and bilateral arms. Requiring Max cues to initate task and continue to rub lotion until task finished. Pt requiring Total A to maintain sitting balance at EOB while applying lotion.     Vision         Perception     Praxis      Pertinent Vitals/Pain Pain Assessment: Faces Faces Pain Scale: Hurts a little bit Pain Location: Lt BKA with activity  Pain Descriptors / Indicators: Grimacing;Operative site guarding Pain Intervention(s): Monitored during session;Repositioned;Limited activity within patient's tolerance     Hand Dominance  Extremity/Trunk Assessment Upper Extremity Assessment Upper Extremity Assessment: Generalized weakness   Lower Extremity Assessment Lower Extremity Assessment: Defer to PT evaluation       Communication     Cognition Arousal/Alertness: Lethargic Behavior During Therapy: Flat affect Overall Cognitive Status: Difficult to assess                                 General Comments: Eyes closed for most of session but pt occasionally answering questions appropriately. Slow to  respond at times.   General Comments       Exercises Other Exercises Other Exercises: Pt was able to sit EOB ~8 minutes and participate in ADL. PT providing posterior support (up to max assist)    Shoulder Instructions      Home Living                                          Prior Functioning/Environment                   OT Problem List:        OT Treatment/Interventions:      OT Goals(Current goals can be found in the care plan section) Acute Rehab OT Goals Patient Stated Goal: None stated  OT Frequency: Min 2X/week   Barriers to D/C:            Co-evaluation PT/OT/SLP Co-Evaluation/Treatment: Yes Reason for Co-Treatment: Complexity of the patient's impairments (multi-system involvement);To address functional/ADL transfers PT goals addressed during session: Mobility/safety with mobility;Balance OT goals addressed during session: ADL's and self-care      AM-PAC PT "6 Clicks" Daily Activity     Outcome Measure Help from another person eating meals?: A Little Help from another person taking care of personal grooming?: A Little Help from another person toileting, which includes using toliet, bedpan, or urinal?: Total Help from another person bathing (including washing, rinsing, drying)?: A Lot Help from another person to put on and taking off regular upper body clothing?: A Lot Help from another person to put on and taking off regular lower body clothing?: Total 6 Click Score: 12   End of Session Nurse Communication: Mobility status;Other (comment)  Activity Tolerance: Patient limited by pain;Patient limited by fatigue Patient left: in bed;with call bell/phone within reach;with bed alarm set;with family/visitor present  OT Visit Diagnosis: Pain Pain - Right/Left: Left Pain - part of body: Leg                Time: 1318-1400 OT Time Calculation (min): 42 min Charges:  OT General Charges $OT Visit: 1 Visit OT Treatments $Self Care/Home  Management : 23-37 mins  [name] MSOT, OTR/L Acute Rehab Pager: [phone] Office: [phone]  [name] [name] 03/08/2018, 4:40 PM

## 2018-03-08 NOTE — Consult Note (Signed)
Consultation Note Date: 03/08/2018   Patient Name: [name]  DOB: 1933-01-24  MRN: [id number]  Age / Sex: 84 y.o., female  PCP: Jonathon Jordan, MD Referring Physician: Cristal Ford, DO  Reason for Consultation: Establishing goals of care  HPI/Patient Profile: 84 y.o. female admitted on 02/27/2018 from [facility] facility with complaints of left foot infection after being seen at Dr. Leigh Aurora office. She is a past medical history significant for CVA, peripheral artery disease status post balloon angioplasty (2/19 and 9/19), right below-knee amputation (7/19), hyperparathyroidism, diabetes, end-stage renal disease on HD (Tue,Thur,Sat), depression, anemia, GERD, and osteoarthritis.  Patient was previously at [facility] facility and scheduled to be discharged home the day of hospital admission.  Prior to facility placement for rehab patient resided with her son.  During her ED course patient denied fever.  She has pressure ulcers and complications with constipation. Since admission patient underwent left BKA (03/01/18) due to gangrenous heel and toe area. She continues on antibiotics. She developed leukocytosis which is now trending downward (43.8). She is being followed by Gastroenterology. CT abdomen showed rectal wall thickening to the anus with masslike appearance concerning for primary rectal ca versus acute proctitis. C. Difficile negative. GI recommending possible flex sigmoidoscopy. She is continued on home medications and dialysis. Palliative Medicine team consulted for goals of care discussion.   Clinical Assessment and Goals of Care: I have reviewed medical records including lab results, imaging, Epic notes, and MAR, received report from the bedside RN, and assessed the patient. I then met at the bedside with her niece Ricci Barker [facility]) and her daughter to discuss diagnosis prognosis, GOC, EOL wishes,  disposition and options.  Patient is alert and oriented x3.  She is able to engage in goals of care conversation with her nieces and I, however patient states "talk to [place] she knows what to do.  I just want to sleep."  Denies pain.  I introduced Palliative Medicine as specialized medical care for people living with serious illness. It focuses on providing relief from the symptoms and stress of a serious illness. The goal is to improve quality of life for both the patient and the family.  We discussed a brief life review of the patient.  Family reports patient lived alone prior to going to rehab.  Her son lived several doors down.  Patient has 1 son has been a widow for many years.  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  She is a woman of [place].  Family reports patient loves puzzle books.  As far as functional and nutritional status family reports patient generally has a good appetite.  They do reports that she has been at [facility] patient does not eat as much as she did prior to admission.  Family often takes her home-cooked meals and patient would generally eat entire meal asking for more.  Family shared a picture of patient eating home cooked meal prior to admission.  In the picture patient was holding the plate up to her  face.  Family reports patient was able to ambulate with walker and right leg prosthesis.  She required some assistance with ADLs however she was pretty self-sufficient in regards to her amputation.  Family does feel that since patient has had her amputation and now her recent amputation they have noticed a decrease in her interest in conversations.  We discussed her current illness and what it means in the larger context of her on-going co-morbidities.  Natural disease trajectory and expectations at EOL were discussed.  Mrs. verbalized understanding of patient current illness and comorbidities.  Remain hopeful patient will continue to show signs of  improvement.  We discussed in detail patient's current condition specifically to dialysis, amputations, and functional status. Family is present that patient has been on dialysis for more than 10 years.  They are concerned that patient will continue to show signs of decline and/or depression due to bilateral amputations and awareness that she will now have to depend on someone for majority of her care.  I attempted to elicit values and goals of care important to the patient.    The difference between aggressive medical intervention and comfort care was considered in light of the patient's goals of care.  At this time patient and family verbalized their wishes to continue with aggressive medical interventions.  Again they remain hopeful for improvement.  We also discussed patient's need for flex sigmoidoscopy at gastroenterology's recommendation.  We discussed the possibility of patient having complications such as cancer or colitis.  Family verbalized understanding and states they would like to proceed with any kind of procedures recommended.  This will allow them to be fully aware of any medical conditions that she may be going through and will allow them all information needed to make any kind of medical decisions in the future.  Patient confirms she would like procedure done and states "but if they find something like cancer I would not be doing anything about it." Support was given.   I discussed with patient and family concepts specific to CODE STATUS, artificial feeding, and rehospitalization.  Patient states "if something happens and I die I do not want to be brought back!"  Further clarification I discussed patient's current full CODE STATUS and educated patient and family at this time if patient was to have a emergent cardiac or respiratory event medical staff would perform CPR, defibrillation, and intubation if needed.  Patient stated that she would not want to have any of this done.  Nieces  verbalized understanding and expressed wishes to have patient status changed to DNR/DNI.  Family educated that nursing would place a purple bracelet on patient's arm to identify to the medical team patient wishes.  Patient and family verbalized understanding and appreciation.  When discussing artificial feedings, patient stated she was not sure about a PEG tube. Niece expressed they could not provide a decision on their wishes regarding PEG tube and all information would be considered if faced with that decision.   Offered to complete MOST form identifying patient wishes. At request MOST was completed identifying DNR/DNI, limited interventions, use of antibiotics and IV fluids if indicated, and PEG tube for defined trial.   Hospice and Palliative Care services outpatient were explained and offered. At this time given patient/family expressed goals of care. Patient is most appropriate for outpatient palliative services. Family educated if patient declines and/or family prepared to discontinue HD and shift care to a more comfort focus they can discuss with outpatient palliative. The outpatient team can  then assist with transitioning care to hospice at their request. Family verbalized understanding.   Family requesting to have CSW look into additional SNF options. They verbalized they would not like patient to return to [facility] unless this is the only option.   Questions and concerns were addressed. The family was encouraged to call with questions or concerns.  PMT will continue to support holistically.  Primary Decision Maker: Patient is A&O x3, she is capable of making some medical decisions, however she diverts all conversations and decisions to her niece/POA Ricci Barker. Patient verbally confirmed that niece was her POA also.  HCPOA    SUMMARY OF RECOMMENDATIONS    DNR/DNI-as requested by patient/family. MOST form completed. Family given a copy and Original has been placed on chart.   Continue  to treat the treatable. Patient/Family would like to proceed with flex sigmoid per GI.   Family expressed wishes to seek different facility placement other than [facility] at discharge. Additional planning taking place for patient to return home with her son. Niece reports they are moving patient out of her current apartment prior to Nov. 1st in order to prevent paying rent.   Family express wishes for outpatient Palliative.   Lorriane Shire, CSW aware of family's request for SNF w/rehab placement, with hopes of not having to return to [facility]. Per Lorriane Shire family is aware that they have exhausted her remaining Medicare rehab days and will follow up to provide clarifications again.   Palliative Medicine team will continue to support patient, family, and medical team as needed.   Code Status/Advance Care Planning:  DNR  Palliative Prophylaxis:   Aspiration, Bowel Regimen, Frequent Pain Assessment, Palliative Wound Care and Turn Reposition  Additional Recommendations (Limitations, Scope, Preferences):  Full Scope Treatment -continue to treat the treatable.   Psycho-social/Spiritual:   Desire for further Chaplaincy support:NO   Additional Recommendations: Caregiving  Support/Resources  Prognosis:   Unable to determine-Guarded to Poor in the setting of bilateral BKA,  CVA, peripheral artery disease status post balloon angioplasty (2/19 and 9/19), right below-knee amputation (7/19), hyperparathyroidism, diabetes, end-stage renal disease on HD (Tue,Thur,Sat), depression, anemia, GERD, and osteoarthritis.  Discharge Planning: To Be Determined with outpatient Palliative.      Primary Diagnoses: Present on Admission: . (Resolved) Wound infection . Gangrene ([place]) . Anemia of chronic disease . Atherosclerosis of native arteries of the extremities with ulceration ([place]) . Benign essential HTN . Diabetes mellitus with peripheral vascular disease ([place]) . Colitis   I have reviewed the medical  record, interviewed the patient and family, and examined the patient. The following aspects are pertinent.  Past Medical History:  Diagnosis Date  . Anemia   . C. difficile diarrhea   . Depression   . Diabetes mellitus   . Diabetic neuropathy ([place])   . ESRD (end stage renal disease) on dialysis ([facility])    TTS  . GERD (gastroesophageal reflux disease)   . Gout   . Hyperparathyroidism   . MRSA (methicillin resistant staph aureus) culture positive   . Obesity   . Osteoarthritis   . Peripheral arterial disease (H[facility])    Gangrene-Left Great Toe  . S/P BKA (below knee amputation) unilateral, right ([place])   . Stroke [facility])    Social History   Socioeconomic History  . Marital status: Divorced    Spouse name: Not on file  . Number of children: 0  . Years of education: college  . Highest education level: Not on file  Occupational History  . Occupation: retired  Employer: RETIRED  Social Needs  . Financial resource strain: Not on file  . Food insecurity:    Worry: Not on file    Inability: Not on file  . Transportation needs:    Medical: Not on file    Non-medical: Not on file  Tobacco Use  . Smoking status: Former Smoker    Types: Cigarettes    Last attempt to quit: 1980    Years since quitting: 39.8  . Smokeless tobacco: Never Used  Substance and Sexual Activity  . Alcohol use: No  . Drug use: No  . Sexual activity: Not Currently  Lifestyle  . Physical activity:    Days per week: Not on file    Minutes per session: Not on file  . Stress: Not on file  Relationships  . Social connections:    Talks on phone: Not on file    Gets together: Not on file    Attends religious service: Not on file    Active member of club or organization: Not on file    Attends meetings of clubs or organizations: Not on file    Relationship status: Not on file  Other Topics Concern  . Not on file  Social History Narrative   reports that she has quit smoking. Her smoking use included  cigarettes. She has never used smokeless tobacco. She reports that she does not drink alcohol or use drugs.   Family History  Problem Relation Age of Onset  . Cancer Father        type unknown  . Stroke Mother   . Diabetes Sister   . Cancer Brother        type unknown   Scheduled Meds: . amiodarone  200 mg Oral BID  . apixaban  2.5 mg Oral BID  . Chlorhexidine Gluconate Cloth  6 each Topical Q0600  . cinacalcet  30 mg Oral Q supper  . darbepoetin (ARANESP) injection - DIALYSIS  60 mcg Intravenous Q Tue-HD  . digoxin  0.0625 mg Oral Daily  . doxercalciferol  1 mcg Intravenous Q T,Th,Sa-HD  . feeding supplement (NEPRO CARB STEADY)  237 mL Oral Q24H  . feeding supplement (PRO-STAT SUGAR FREE 64)  30 mL Oral BID  . gabapentin  100 mg Oral QHS  . insulin aspart  0-5 Units Subcutaneous QHS  . insulin aspart  0-9 Units Subcutaneous TID WC  . midodrine  10 mg Oral BID WC  . multivitamin  1 tablet Oral QHS   Continuous Infusions: PRN Meds:.acetaminophen **OR** acetaminophen, bisacodyl, calcium carbonate (dosed in mg elemental calcium), camphor-menthol **AND** hydrOXYzine, docusate sodium, ondansetron **OR** ondansetron (ZOFRAN) IV, oxyCODONE-acetaminophen, sodium phosphate, sorbitol, zolpidem Medications Prior to Admission:  Prior to Admission medications   Medication Sig Start Date End Date Taking? Authorizing Provider  acetaminophen (TYLENOL) 325 MG tablet Take 650 mg by mouth every 6 (six) hours as needed for fever.   Yes [provider]  Amino Acids-Protein Hydrolys (FEEDING SUPPLEMENT, PRO-STAT SUGAR FREE 64,) LIQD Take 30 mLs by mouth daily.    Yes [provider]  amiodarone (PACERONE) 200 MG tablet Take 1 tablet (200 mg total) by mouth 2 (two) times daily. 02/24/18  Yes Medina-Vargas, Monina C, NP  apixaban (ELIQUIS) 2.5 MG TABS tablet Take 1 tablet (2.5 mg total) by mouth 2 (two) times daily. 02/24/18  Yes Medina-Vargas, Monina C, NP  bisacodyl (DULCOLAX) 10 MG  suppository Place 10 mg rectally as needed for moderate constipation.   Yes [provider]  cinacalcet (SENSIPAR) 30 MG tablet Take 1 tablet (30 mg total) by mouth every evening. 02/24/18  Yes Medina-Vargas, Monina C, NP  clopidogrel (PLAVIX) 75 MG tablet Take 1 tablet (75 mg total) by mouth daily. 02/24/18  Yes Medina-Vargas, Monina C, NP  digoxin (LANOXIN) 0.125 MG tablet Take 0.0625 mg by mouth daily.   Yes [provider]  gabapentin (NEURONTIN) 100 MG capsule Take 1 capsule (100 mg total) by mouth at bedtime. 02/24/18  Yes Medina-Vargas, Monina C, NP  metoprolol tartrate (LOPRESSOR) 25 MG tablet Take 0.5 tablets (12.5 mg total) by mouth 2 (two) times daily. 02/24/18  Yes Medina-Vargas, Monina C, NP  midodrine (PROAMATINE) 10 MG tablet Take 1 tablet (10 mg total) by mouth 2 (two) times daily with a meal. 02/27/18  Yes Medina-Vargas, Monina C, NP  Nutritional Supplements (FEEDING SUPPLEMENT, NEPRO CARB STEADY,) LIQD Take 237 mLs by mouth daily.   Yes [provider]  oxyCODONE-acetaminophen (PERCOCET/ROXICET) 5-325 MG tablet Take 1 tablet by mouth every 6 (six) hours as needed. Patient taking differently: Take 1 tablet by mouth every 6 (six) hours as needed for moderate pain.  02/24/18  Yes Medina-Vargas, Monina C, NP  RENVELA 800 MG tablet Take 2-3 tablets (1,600-2,400 mg total) by mouth See admin instructions. Take 2400 mg by mouth 3 times daily with meals and take 1600 mg by mouth with snacks 02/24/18  Yes Medina-Vargas, Monina C, NP  sennosides-docusate sodium (SENOKOT-S) 8.6-50 MG tablet Take 2 tablets by mouth 2 (two) times daily.   Yes [provider]   Allergies  Allergen Reactions  . Codeine Hives  . Vancomycin Rash   Review of Systems  Constitutional: Positive for appetite change.  Musculoskeletal: Positive for joint swelling.  Neurological: Positive for weakness.  All other systems reviewed and are negative.   Physical Exam  Constitutional:  She is oriented to person, place, and time. Vital signs are normal. She is cooperative. She appears ill.  Chronically ill appearing   Cardiovascular: Normal rate, regular rhythm, normal heart sounds and normal pulses.  Pulmonary/Chest: Effort normal. She has decreased breath sounds.  Abdominal: Soft. Normal appearance and bowel sounds are normal.  Musculoskeletal:  Bilateral BKA, staples intact to left stump   Neurological: She is alert and oriented to person, place, and time.  Patient able to verbalize name, dob, and that she was in [place] to identify nieces at bedside.   Skin: Skin is warm, dry and intact. Bruising and rash noted.  dry  Psychiatric: She has a normal mood and affect. Her speech is normal. Judgment normal. Cognition and memory are normal.  Nursing note and vitals reviewed.   Vital Signs: BP 120/72 (BP Location: Left Arm)   Pulse 72   Temp 97.7 F (36.5 C) (Oral)   Resp 18   Ht 5' 2"  (1.575 m)   Wt 75.2 kg   SpO2 100%   BMI 30.32 kg/m  Pain Scale: 0-10   Pain Score: 0-No pain   SpO2: SpO2: 100 % O2 Device:SpO2: 100 % O2 Flow Rate: .O2 Flow Rate (L/min): 2 L/min  IO: Intake/output summary:   Intake/Output Summary (Last 24 hours) at 03/08/2018 0843 Last data filed at 03/08/2018 0600 Gross per 24 hour  Intake 240 ml  Output 1685 ml  Net -1445 ml    LBM: Last BM Date: 03/07/18 Baseline Weight: Weight: 80.3 kg Most recent weight: Weight: 75.2 kg     Palliative Assessment/Data: PPS 30 %   Time In: 0800 Time  Out: 0930 Time Total: 90 min.   Greater than 50%  of this time was spent counseling and coordinating care related to the above assessment and plan.  Signed by:  Alda [name], AGPCNP-BC Palliative Medicine Team  Phone: [phone] Fax: [phone] Pager: [phone] Amion: Bjorn Pippin    Please contact Palliative Medicine Team phone at [phone] for questions and concerns.  For individual provider: See  Shea Evans

## 2018-03-08 NOTE — Progress Notes (Addendum)
PROGRESS NOTE    Jamie KURI  [name]:[id number] DOB: 06-Jan-1933 DOA: 02/27/2018 PCP: Mila Palmer, MD   Brief Narrative:  HPI on 02/27/2018 by Dr. Jonah Blue [name] is a 84 y.o. female with medical history significant of CVA; PAD s/p balloon angioplasty in 2/19 and 9/19 and R BKA in 7/19; obesity (BMI 32); hyperparathyroidism; DM; and ESRD on HD presenting with wound infection.  She was due to be discharged today.  She went to Dr. Ardelle Anton today and he sent her to the Er for ongoing foot infection.  No fevers.  She has been feeling well.  She has had significant constipation.  Also with pressure ulcers. Assessment & Plan   Leukocytosis with gangrene of the left foot/heel -Prior history of right BKA, patient had undergone angiography and angioplasty of the left peroneal artery September 2019 -Presented with gangrenous changes of the left heel area with toe, placed on empiric antibiotics -Underwent left BKA on 03/01/2018 for gangrene -Blood cultures remain negative to date -Leukocytosis now trending downward, currently 43.8  Rectal wall thickening, proctitis versus mass -CT abdomen showed rectal wall thickening to the anus with masslike appearance concerning for primary rectal cancer versus acute proctitis -Infectious disease, Dr. Ilsa Iha recommended oral Dificid with IV metronidazole, then changed to oral vancomycin -C. difficile was negative -ID, Dr., recommended to stop vancomycin and monitor off of antibiotics -Gastroenterology consulted and appreciated, pending flex sigmoidoscopy  Diabetes mellitus, type II peripheral vascular disease -Continue insulin sliding scale with CBG monitoring  End-stage renal disease -Patient dialyzes on Tuesday, Thursday, Saturday -Nephrology consulted and appreciated  Anemia of chronic disease -hemoglobin currently stable, currently 9.1 -Continue to monitor CBC  Essential hypertension  History of atrial fibrillation -Continue  Eliquis  Stage II decubiti to the buttock and sacral areas -Wound care per nursing  Goals of care/CODE STATUS -Palliative care consulted and appreciated.  Discussed with palliative care, family (patient's nieces) have opted to make the patient DNR at this time.  We will continue to treat the treatable and continue dialysis for now.  Would like to have flexible sigmoidoscopy done to evaluate this question of a mass.  DVT Prophylaxis  Eliquis  Code Status: DNR  Family Communication: Neices at bedside  Disposition Plan: Admitted. Pending further workup.   Consultants Palliative care Vascular surgery Nephrology Infectious disease Gastroenterology  Procedures  Left BKA 03/01/2018  Antibiotics   Anti-infectives (From admission, onward)   Start     Dose/Rate Route Frequency Ordered Stop   03/06/18 1530  ciprofloxacin (CIPRO) IVPB 400 mg  Status:  Discontinued     400 mg 200 mL/hr over 60 Minutes Intravenous Every 24 hours 03/05/18 1536 03/05/18 1541   03/06/18 1400  vancomycin (VANCOCIN) 50 mg/mL oral solution 125 mg  Status:  Discontinued     125 mg Oral 4 times daily 03/06/18 1220 03/06/18 1358   03/05/18 2200  fidaxomicin (DIFICID) tablet 200 mg  Status:  Discontinued     200 mg Oral 2 times daily 03/05/18 1545 03/06/18 1220   03/05/18 1530  metroNIDAZOLE (FLAGYL) IVPB 500 mg  Status:  Discontinued     500 mg 100 mL/hr over 60 Minutes Intravenous Every 8 hours 03/05/18 1524 03/06/18 1220   03/05/18 1530  ciprofloxacin (CIPRO) IVPB 400 mg  Status:  Discontinued     400 mg 200 mL/hr over 60 Minutes Intravenous Every 12 hours 03/05/18 1524 03/05/18 1536   03/02/18 1600  piperacillin-tazobactam (ZOSYN) IVPB 3.375 g  Status:  Discontinued  3.375 g 12.5 mL/hr over 240 Minutes Intravenous Every 12 hours 03/02/18 0947 03/05/18 0617   03/02/18 1600  linezolid (ZYVOX) IVPB 600 mg  Status:  Discontinued     600 mg 300 mL/hr over 60 Minutes Intravenous Every 12 hours 03/02/18 0947  03/02/18 1315   03/02/18 1600  linezolid (ZYVOX) IVPB 600 mg  Status:  Discontinued     600 mg 300 mL/hr over 60 Minutes Intravenous Every 12 hours 03/02/18 1315 03/05/18 0617   02/27/18 1600  piperacillin-tazobactam (ZOSYN) IVPB 2.25 g  Status:  Discontinued     2.25 g 100 mL/hr over 30 Minutes Intravenous Every 8 hours 02/27/18 1527 02/27/18 1544   02/27/18 1600  linezolid (ZYVOX) IVPB 600 mg  Status:  Discontinued     600 mg 300 mL/hr over 60 Minutes Intravenous Every 12 hours 02/27/18 1537 03/02/18 0947   02/27/18 1600  piperacillin-tazobactam (ZOSYN) IVPB 3.375 g  Status:  Discontinued     3.375 g 12.5 mL/hr over 240 Minutes Intravenous Every 12 hours 02/27/18 1544 03/02/18 0947      Subjective:   Stacy Gardner seen and examined today.  Patient has no complaints today.  Denies current chest pain, shortness breath, abdominal pain, nausea or vomiting, diarrhea or constipation.  Aware that her family is in the room.  Objective:   Vitals:   03/07/18 2053 03/07/18 2054 03/08/18 0516 03/08/18 0737  BP: 117/62  (!) 119/53 120/72  Pulse: 83  65 72  Resp: 16  15 18   Temp: 98.6 F (37 C)  98.4 F (36.9 C) 97.7 F (36.5 C)  TempSrc:    Oral  SpO2: (!) 85% 93% 96% 100%  Weight:      Height:        Intake/Output Summary (Last 24 hours) at 03/08/2018 1115 Last data filed at 03/08/2018 0900 Gross per 24 hour  Intake 480 ml  Output 0 ml  Net 480 ml   Filed Weights   03/04/18 0646 03/04/18 1100 03/06/18 2100  Weight: 75.9 kg 75.3 kg 75.2 kg    Exam  General: Well developed, chronically ill-appearing, NAD  HEENT: NCAT, mucous membranes moist.   Neck: Supple  Cardiovascular: S1 S2 auscultated, Regular rate and rhythm.  Respiratory: Clear to auscultation bilaterally with equal chest rise  Abdomen: Soft, nontender, nondistended, + bowel sounds  Extremities: B/L BKA, L stump with staples  Neuro: AAOx2, nonfocal  Psych: appropriate mood and affect   Data Reviewed: I  have personally reviewed following labs and imaging studies  CBC: Recent Labs  Lab 03/04/18 0727 03/05/18 0650 03/06/18 0547 03/07/18 0838 03/08/18 0854  WBC 52.8* 66.0* 58.1* 48.9* 43.8*  NEUTROABS 47.7* 59.5* 51.1* 45.0* 36.8*  HGB 9.3* 9.5* 9.4* 8.9* 9.1*  HCT 32.8* 34.2* 33.0* 31.8* 30.8*  MCV 79.8* 78.8* 78.9* 76.8* 76.0*  PLT 427* 377 332 296 214   Basic Metabolic Panel: Recent Labs  Lab 03/02/18 0920  03/04/18 0500 03/05/18 0650 03/06/18 0547 03/07/18 0838 03/08/18 0854  NA 133*   < > 134* 134* 132* 134* 132*  K 3.6   < > 3.9 3.7 4.0 3.4* 3.3*  CL 95*   < > 95* 93* 93* 94* 96*  CO2 25   < > 25 22 19* 23 23  GLUCOSE 140*   < > 169* 197* 156* 159* 154*  BUN 31*   < > 29* 17 34* 57* 33*  CREATININE 5.70*   < > 4.88* 3.37* 4.40* 5.33* 3.31*  CALCIUM 8.2*   < >  8.0* 7.9* 7.7* 7.6* 7.7*  PHOS 3.0  --   --   --   --  2.1*  --    < > = values in this interval not displayed.   GFR: Estimated Creatinine Clearance: 12.2 mL/min (A) (by C-G formula based on SCr of 3.31 mg/dL (H)). Liver Function Tests: Recent Labs  Lab 03/02/18 0920 03/07/18 0838  ALBUMIN 2.4* 2.0*   No results for input(s): LIPASE, AMYLASE in the last 168 hours. No results for input(s): AMMONIA in the last 168 hours. Coagulation Profile: No results for input(s): INR, PROTIME in the last 168 hours. Cardiac Enzymes: No results for input(s): CKTOTAL, CKMB, CKMBINDEX, TROPONINI in the last 168 hours. BNP (last 3 results) No results for input(s): PROBNP in the last 8760 hours. HbA1C: No results for input(s): HGBA1C in the last 72 hours. CBG: Recent Labs  Lab 03/06/18 2136 03/07/18 1206 03/07/18 1648 03/07/18 2052 03/08/18 0738  GLUCAP 140* 83 139* 143* 134*   Lipid Profile: No results for input(s): CHOL, HDL, LDLCALC, TRIG, CHOLHDL, LDLDIRECT in the last 72 hours. Thyroid Function Tests: No results for input(s): TSH, T4TOTAL, FREET4, T3FREE, THYROIDAB in the last 72 hours. Anemia Panel: No  results for input(s): VITAMINB12, FOLATE, FERRITIN, TIBC, IRON, RETICCTPCT in the last 72 hours. Urine analysis:    Component Value Date/Time   COLORURINE YELLOW 12/17/2006 0005   APPEARANCEUR TURBID (A) 12/17/2006 0005   LABSPEC 1.019 12/17/2006 0005   PHURINE 5.0 12/17/2006 0005   GLUCOSEU NEGATIVE 12/17/2006 0005   HGBUR MODERATE (A) 12/17/2006 0005   BILIRUBINUR SMALL (A) 12/17/2006 0005   KETONESUR 15 (A) 12/17/2006 0005   PROTEINUR NEGATIVE 12/17/2006 0005   UROBILINOGEN 0.2 12/17/2006 0005   NITRITE NEGATIVE 12/17/2006 0005   LEUKOCYTESUR LARGE (A) 12/17/2006 0005   Sepsis Labs: @LABRCNTIP (procalcitonin:4,lacticidven:4)  ) Recent Results (from the past 240 hour(s))  Blood Cultures x 2 sites     Status: None   Collection Time: 02/27/18  5:15 PM  Result Value Ref Range Status   Specimen Description BLOOD RIGHT ANTECUBITAL  Final   Special Requests   Final    BOTTLES DRAWN AEROBIC AND ANAEROBIC Blood Culture adequate volume   Culture   Final    NO GROWTH 5 DAYS Performed at [facility], [address], [place], [place] [address]    Report Status 03/04/2018 FINAL  Final  Blood Cultures x 2 sites     Status: None   Collection Time: 02/27/18  7:10 PM  Result Value Ref Range Status   Specimen Description BLOOD RIGHT FOREARM  Final   Special Requests   Final    BOTTLES DRAWN AEROBIC ONLY Blood Culture adequate volume   Culture   Final    NO GROWTH 5 DAYS Performed at [facility], [address] [place], [place] [address]    Report Status 03/04/2018 FINAL  Final  MRSA PCR Screening     Status: None   Collection Time: 02/27/18 11:04 PM  Result Value Ref Range Status   MRSA by PCR NEGATIVE NEGATIVE Final    Comment:        The GeneXpert MRSA Assay (FDA approved for NASAL specimens only), is one component of a comprehensive MRSA colonization surveillance program. It is not intended to diagnose MRSA infection nor to guide or monitor treatment for MRSA  infections. Performed at [facility], [address], [place], [place] [address]   Culture, blood (routine x 2)  Status: None   Collection Time: 03/02/18  2:30 PM  Result Value Ref Range Status   Specimen Description BLOOD LEFT ANTECUBITAL  Final   Special Requests   Final    BOTTLES DRAWN AEROBIC ONLY Blood Culture adequate volume   Culture   Final    NO GROWTH 5 DAYS Performed at [facility], [address] [place], [place] [address]    Report Status 03/07/2018 FINAL  Final  Culture, blood (routine x 2)     Status: None   Collection Time: 03/02/18  2:44 PM  Result Value Ref Range Status   Specimen Description BLOOD LEFT ANTECUBITAL  Final   Special Requests   Final    BOTTLES DRAWN AEROBIC ONLY Blood Culture adequate volume   Culture   Final    NO GROWTH 5 DAYS Performed at [facility], [address], [place], [place] [address]    Report Status 03/07/2018 FINAL  Final  C difficile quick scan w PCR reflex     Status: None   Collection Time: 03/05/18  5:18 PM  Result Value Ref Range Status   C Diff antigen NEGATIVE NEGATIVE Final   C Diff toxin NEGATIVE NEGATIVE Final   C Diff interpretation No C. difficile detected.  Final      Radiology Studies: No results found.   Scheduled Meds: . amiodarone  200 mg Oral BID  . apixaban  2.5 mg Oral BID  . Chlorhexidine Gluconate Cloth  6 each Topical Q0600  . cinacalcet  30 mg Oral Q supper  . darbepoetin (ARANESP) injection - DIALYSIS  60 mcg Intravenous Q Tue-HD  . digoxin  0.0625 mg Oral Daily  . doxercalciferol  1 mcg Intravenous Q T,Th,Sa-HD  . feeding supplement (NEPRO CARB STEADY)  237 mL Oral Q24H  . feeding supplement (PRO-STAT SUGAR FREE 64)  30 mL Oral BID  . gabapentin  100 mg Oral QHS  . insulin aspart  0-5 Units Subcutaneous QHS  . insulin aspart  0-9 Units Subcutaneous TID WC  . midodrine  10 mg Oral BID WC  . multivitamin  1 tablet Oral QHS   Continuous Infusions:   LOS: 9 days    Time Spent in minutes   45 minutes (greater than 50% of time spent with patient face to face, as well as reviewing records, calling and discussing with consults, and formulating a plan)   Edsel Petrin D.O. on 03/08/2018 at 11:15 AM  Between 7am to 7pm - Please see pager noted on [email or web address]  After 7pm go to [email or web address]  And look for the night coverage person covering for me after hours  [facility] Office  [phone]

## 2018-03-08 NOTE — Care Management (Addendum)
    Durable Medical Equipment  (From admission, onward)         Start     Ordered   03/08/18 1147  For home use only DME Hospital bed  Once    Comments:  Needs amputee sling   Call son Alwyn Ren [phone] for delivery  [address] [place] [address]  Question Answer Comment  Patient has (list medical condition): gangrene of the left foot/heel and history of right below knee amputation   The above medical condition requires: Patient requires the ability to reposition frequently   Head must be elevated greater than: 45 degrees   Bed type Semi-electric   Hoyer Lift Yes   Support Surface: Low Air loss Mattress      03/08/18 1148   03/08/18 1145  For home use only DME lightweight manual wheelchair with seat cushion  Once    Comments:  Patient suffers from Leukocytosis with gangrene of the left foot/heel and history of right below knee amputation  which impairs their ability to perform daily activities like ambulating  in the home.  A walker  will not resolve  issue with performing activities of daily living. A wheelchair will allow patient to safely perform daily activities. Patient is not able to propel themselves in the home using a standard weight wheelchair due to  gangrene of the left foot/heel and history of right below knee amputation and weakness  . Patient can self propel in the lightweight wheelchair.  Accessories: elevating leg rests (ELRs), wheel locks, extensions and anti-tippers.   03/08/18 1146        Stage II decubiti to the buttock and sacral areas

## 2018-03-08 NOTE — Interval H&P Note (Signed)
History and Physical Interval Note:  03/08/2018 2:48 PM  [name]  has presented today for surgery, with the diagnosis of r/o rectal mass  The various methods of treatment have been discussed with the patient and family. After consideration of risks, benefits and other options for treatment, the patient has consented to  Procedure(s): FLEXIBLE SIGMOIDOSCOPY (N/A) as a surgical intervention .  The patient's history has been reviewed, patient examined, no change in status, stable for surgery.  I have reviewed the patient's chart and labs.  Questions were answered to the patient's satisfaction.     Viviann Spare P Armbruster

## 2018-03-08 NOTE — Progress Notes (Addendum)
[name] Progress Note   Subjective: Sleeping, difficult to arouse. No C/Os. For flex sigmoidoscopy later today.   Objective Vitals:   03/07/18 2053 03/07/18 2054 03/08/18 0516 03/08/18 0737  BP: 117/62  (!) 119/53 120/72  Pulse: 83  65 72  Resp: 16  15 18   Temp: 98.6 F (37 C)  98.4 F (36.9 C) 97.7 F (36.5 C)  TempSrc:    Oral  SpO2: (!) 85% 93% 96% 100%  Weight:      Height:       Physical Exam General:Chronically ill appearing elderly female in NAD Heart:S1,S2, RRR Lungs:CTAB Abdomen:soft, NT Extremities:Bilateral BKA. Well healed R BKA, staples intact L BKA without drainage/edema along suture line.. UE edema noted-hands and forearms.  No stump edema. Dialysis Access:[facility]I    Additional Objective Labs: Basic Metabolic Panel: Recent Labs  Lab 03/02/18 0920  03/06/18 0547 03/07/18 0838 03/08/18 0854  NA 133*   < > 132* 134* 132*  K 3.6   < > 4.0 3.4* 3.3*  CL 95*   < > 93* 94* 96*  CO2 25   < > 19* 23 23  GLUCOSE 140*   < > 156* 159* 154*  BUN 31*   < > 34* 57* 33*  CREATININE 5.70*   < > 4.40* 5.33* 3.31*  CALCIUM 8.2*   < > 7.7* 7.6* 7.7*  PHOS 3.0  --   --  2.1*  --    < > = values in this interval not displayed.   Liver Function Tests: Recent Labs  Lab 03/02/18 0920 03/07/18 0838  ALBUMIN 2.4* 2.0*   No results for input(s): LIPASE, AMYLASE in the last 168 hours. CBC: Recent Labs  Lab 03/04/18 0727 03/05/18 0650 03/06/18 0547 03/07/18 0838 03/08/18 0854  WBC 52.8* 66.0* 58.1* 48.9* 43.8*  NEUTROABS 47.7* 59.5* 51.1* 45.0* 36.8*  HGB 9.3* 9.5* 9.4* 8.9* 9.1*  HCT 32.8* 34.2* 33.0* 31.8* 30.8*  MCV 79.8* 78.8* 78.9* 76.8* 76.0*  PLT 427* 377 332 296 214   Blood Culture    Component Value Date/Time   SDES BLOOD LEFT ANTECUBITAL 03/02/2018 1444   SPECREQUEST  03/02/2018 1444    BOTTLES DRAWN AEROBIC ONLY Blood Culture adequate volume   CULT  03/02/2018 1444    NO GROWTH 5 DAYS Performed at [facility], [address] [place], [place] [address]    REPTSTATUS 03/07/2018 FINAL 03/02/2018 1444    Cardiac Enzymes: No results for input(s): CKTOTAL, CKMB, CKMBINDEX, TROPONINI in the last 168 hours. CBG: Recent Labs  Lab 03/06/18 2136 03/07/18 1206 03/07/18 1648 03/07/18 2052 03/08/18 0738  GLUCAP 140* 83 139* 143* 134*   Iron Studies: No results for input(s): IRON, TIBC, TRANSFERRIN, FERRITIN in the last 72 hours. @lablastinr3 @ Studies/Results: No results found. Medications:  . amiodarone  200 mg Oral BID  . apixaban  2.5 mg Oral BID  . Chlorhexidine Gluconate Cloth  6 each Topical Q0600  . cinacalcet  30 mg Oral Q supper  . darbepoetin (ARANESP) injection - DIALYSIS  60 mcg Intravenous Q Tue-HD  . digoxin  0.0625 mg Oral Daily  . doxercalciferol  1 mcg Intravenous Q T,Th,Sa-HD  . feeding supplement (NEPRO CARB STEADY)  237 mL Oral Q24H  . feeding supplement (PRO-STAT SUGAR FREE 64)  30 mL Oral BID  . gabapentin  100 mg Oral QHS  . insulin aspart  0-5 Units Subcutaneous QHS  . insulin aspart  0-9 Units Subcutaneous TID WC  .  midodrine  10 mg Oral BID WC  . multivitamin  1 tablet Oral QHS     Dialysis Orders: [facility] T,Th,S 4 hrs 180NRe 400/autoflow 2.0 77 kg 2.0 K/2.25 Ca linear sodium, UFP 2 RIJ TDC -Heparin 5000 units IV TIW -Hectorol 1 mcg IV TIW -Mircera 100 mcg IV q 2 weeks (last dose 02/16/2018-last HGB 10.4 02/23/2018)  Assessment/Plan: 1. Gangrene L Heel - s/p L BKA 10/23 Dr. Randie Heinz. Healing well 2. Rising WBC - 43>53>66>58today. ID consulting,BC negative. C Diff negative. Currently off all ABX. Per ID-leukocytosis transient process post BKA.  Appreciate ID 3. Rectal wall thickening-proctitis vs mass. GI following. Flex sigmoidoscopy today.  4. ESRD -on HD TTS.HD tomorrow on schedule.  5. Anemia of CKD-Hgb 9.4. Aranesp qwk (tues) Follow trends.  6. Secondary hyperparathyroidism -Ca 7.6 C Ca 9.1 Phos 2.1Continue VDRA, hold  binders, and continue sensipar. 7. Hypotension/volume -on midodrine. Does not appear volume overloaded on exam. Almost 2kg under EDW last HD. Continue to titrate down volume as tolerated. Will need new EDW at d/c.    Agree 8. Nutrition -Alb 2.1 Renal diet w/fluid restrictions. prostat, Renavite 9. DM - per primary 10. Hx A fib on eliquis. Eliquis resumed. Per primary 11. Rash - on atarax 11.  QOL-Palliative care consulted-Pt now DNR. Palliative team to meet with family today. Now DNR.   I have seen and examined this patient and agree with the plan of care   [name] 03/08/2018, 11:52 AM   [name]. Brown NP-C 03/08/2018, 11:11 AM  [employer] [phone]

## 2018-03-08 NOTE — Progress Notes (Signed)
Tap water enema administered with green liquid to clear stool return. Patient tolerated it well. [name], Drinda Butts, Charity fundraiser

## 2018-03-08 NOTE — Clinical Social Work Note (Signed)
CSW visited with patient and family members this morning regarding her discharge plan. CSW had talked with one of the ladies present, [name] regarding patient's discharge. Explained patient not having any more SNF days and time period before her days will resent. Other options for SNF placement is active Medicaid or private pay and this was discussed. [name] has applied for Medicaid, but needs to provide Medicaid worker with requested verifications and this was discussed. Family also informed that nurse case manager will be contacted to talk with niece regarding home health services. CSW will continue to follow through discharge.  Genelle Bal, MSW, LCSW Licensed Clinical Social Worker Clinical Social Work Department [employer] [phone]

## 2018-03-08 NOTE — Care Management (Signed)
    Durable Medical Equipment  (From admission, onward)         Start     Ordered   03/08/18 1425  For home use only DME Hospital bed  Once    Comments:  Needs amputee sling ( regular sling)  Call son Alwyn Ren [phone] for delivery  [address], [place] [address]   Stage II decubiti to the buttock and sacral areas  Question Answer Comment  Patient has (list medical condition): gangrene of the left foot/heel and history of right below knee amputation, Stage II decubiti to the buttock and sacral areas   The above medical condition requires: Patient requires the ability to reposition frequently   Head must be elevated greater than: 45 degrees   Bed type Semi-electric   Hoyer Lift Yes   Support Surface: Low Air loss Mattress      03/08/18 1424   03/08/18 1201  For home use only DME lightweight manual wheelchair with seat cushion  Once    Comments:  Patient suffers from Leukocytosis with gangrene of the left foot/heel  s/p L BKA 10/23 and history of right below knee amputation  which impairs their ability to perform daily activities like ambulating  in the home.  A walker  will not resolve  issue with performing activities of daily living. A wheelchair will allow patient to safely perform daily activities. Patient is not able to propel themselves in the home using a standard weight wheelchair due to  gangrene of the left foot/heel and history of right below knee amputation and weakness  . Patient can self propel in the lightweight wheelchair.  Accessories: elevating leg rests (ELRs), wheel locks, extensions and anti-tippers.   03/08/18 1200

## 2018-03-08 NOTE — Progress Notes (Signed)
Physical Therapy Treatment Patient Details Name: [name] MRN: [id number] DOB: [birth year] Today's Date: 03/08/2018    History of Present Illness This 84 y.o. female admitted from SNF presents with wound infection Lt foot heel with ongoing leukocytosis.  SHe underwent Lt BKA 03/01/18.  PMH includes s/p Rt BKA, CVA, PAD, s/p balloon angioplasty, DM, ESRD with HD     PT Comments    Pt seen in conjunction with OT to maximize safety with functional mobility. She continues to be lethargic which limited overall mobility during session. Pt incontinent and part of session was changing gown and putting clean linen under pt. Noted several areas of skin breakdown on backside, as well as area of red peeling skin on R flank. RN notified and present to assist therapists with placing pink foam pads on open areas. Confirmed with CM after session pt will need [employer] with regular sling for home. Will continue to follow    Follow Up Recommendations  Home health PT;Supervision/Assistance - 24 hour     Equipment Recommendations  Wheelchair (measurements PT);Wheelchair cushion (measurements PT);Hospital bed; [facility] lift (with regular sling)   Recommendations for Other Services       Precautions / Restrictions Precautions Precautions: Fall Precaution Comments: bilateral BKA Restrictions Weight Bearing Restrictions: No    Mobility  Bed Mobility Overal bed mobility: Needs Assistance Bed Mobility: Rolling;Sidelying to Sit;Sit to Supine Rolling: Max assist;+2 for physical assistance;+2 for safety/equipment Sidelying to sit: Total assist;+2 for physical assistance;HOB elevated   Sit to supine: Total assist;+2 for physical assistance;+2 for safety/equipment   General bed mobility comments: Pt was able to roll L and R with use of rail. HHA provided at times for pt to have leverage. Total assist required for transition to/from EOB.   Transfers                 General transfer comment:  Deferred due to safety. - recommend Lift pad - had been working on sliding board transfers with prosthesis  Ambulation/Gait                 Stairs             Wheelchair Mobility    Modified Rankin (Stroke Patients Only)       Balance                                            Cognition Arousal/Alertness: Lethargic Behavior During Therapy: Flat affect Overall Cognitive Status: Difficult to assess                                 General Comments: Eyes closed for most of session but pt occasionally answering questions appropriately. Slow to respond at times.      Exercises Other Exercises Other Exercises: Pt was able to sit EOB ~8 minutes and participate in ADL activity with OT. PT providing posterior support (up to max assist)     General Comments        Pertinent Vitals/Pain Pain Assessment: Faces Faces Pain Scale: Hurts a little bit Pain Location: Lt BKA with activity  Pain Descriptors / Indicators: Grimacing;Operative site guarding Pain Intervention(s): Monitored during session    Home Living  Prior Function            PT Goals (current goals can now be found in the care plan section) Acute Rehab PT Goals Patient Stated Goal: None stated PT Goal Formulation: With patient Time For Goal Achievement: 03/18/18 Potential to Achieve Goals: Fair Progress towards PT goals: Not progressing toward goals - comment(Lethargy limiting session)    Frequency    Min 3X/week      PT Plan Current plan remains appropriate    Co-evaluation PT/OT/SLP Co-Evaluation/Treatment: Yes Reason for Co-Treatment: Complexity of the patient's impairments (multi-system involvement);Necessary to address cognition/behavior during functional activity;For patient/therapist safety;To address functional/ADL transfers PT goals addressed during session: Mobility/safety with mobility;Balance        AM-PAC PT "6  Clicks" Daily Activity  Outcome Measure  Difficulty turning over in bed (including adjusting bedclothes, sheets and blankets)?: Unable Difficulty moving from lying on back to sitting on the side of the bed? : Unable Difficulty sitting down on and standing up from a chair with arms (e.g., wheelchair, bedside commode, etc,.)?: Unable Help needed moving to and from a bed to chair (including a wheelchair)?: Total Help needed walking in hospital room?: Total Help needed climbing 3-5 steps with a railing? : Total 6 Click Score: 6    End of Session Equipment Utilized During Treatment: Gait belt Activity Tolerance: Patient tolerated treatment well Patient left: in bed;with call bell/phone within reach;with bed alarm set Nurse Communication: Mobility status;Need for lift equipment(RN present for a portion of session) PT Visit Diagnosis: Muscle weakness (generalized) (M62.81);Pain Pain - Right/Left: Left Pain - part of body: Leg     Time: 1320-1400 PT Time Calculation (min) (ACUTE ONLY): 40 min  Charges:  $Therapeutic Activity: 8-22 mins                     Conni Slipper, PT, DPT Acute Rehabilitation Services Pager: [phone] Office: [phone]    [name] 03/08/2018, 2:49 PM

## 2018-03-08 NOTE — Consult Note (Signed)
   [facility] CM Inpatient Consult   03/08/2018  [name] 1933-02-23 [id number]    [facility] Care Management follow up for potential [facility] Care Management needs.  Chart reviewed. Noted discharge plan is for home with home health and outpatient palliative care services.   Went to bedside to see if any additional support was needed. However, patient was sleeping soundly and family not present at the time.  Raiford Noble, MSN-Ed, RN,BSN [facility] Liaison [phone]

## 2018-03-09 LAB — CBC WITH DIFFERENTIAL/PLATELET
BASOS ABS: 0 10*3/uL (ref 0.0–0.1)
BASOS PCT: 0 %
Band Neutrophils: 3 %
EOS ABS: 1 10*3/uL — AB (ref 0.0–0.5)
EOS PCT: 2 %
HEMATOCRIT: 31.2 % — AB (ref 36.0–46.0)
HEMOGLOBIN: 9 g/dL — AB (ref 12.0–15.0)
LYMPHS PCT: 10 %
Lymphs Abs: 4.9 10*3/uL — ABNORMAL HIGH (ref 0.7–4.0)
MCH: 21.8 pg — ABNORMAL LOW (ref 26.0–34.0)
MCHC: 28.8 g/dL — ABNORMAL LOW (ref 30.0–36.0)
MCV: 75.5 fL — AB (ref 80.0–100.0)
Metamyelocytes Relative: 1 %
Monocytes Absolute: 1.5 10*3/uL — ABNORMAL HIGH (ref 0.1–1.0)
Monocytes Relative: 3 %
Myelocytes: 4 %
NEUTROS PCT: 77 %
NRBC: 1.4 % — AB (ref 0.0–0.2)
Neutro Abs: 41.4 10*3/uL — ABNORMAL HIGH (ref 1.7–7.7)
Platelets: 200 10*3/uL (ref 150–400)
RBC: 4.13 MIL/uL (ref 3.87–5.11)
RDW: 20.8 % — ABNORMAL HIGH (ref 11.5–15.5)
WBC: 48.7 10*3/uL — AB (ref 4.0–10.5)
nRBC: 2 /100 WBC — ABNORMAL HIGH

## 2018-03-09 LAB — BASIC METABOLIC PANEL
ANION GAP: 13 (ref 5–15)
BUN: 44 mg/dL — ABNORMAL HIGH (ref 8–23)
CO2: 23 mmol/L (ref 22–32)
Calcium: 7.6 mg/dL — ABNORMAL LOW (ref 8.9–10.3)
Chloride: 97 mmol/L — ABNORMAL LOW (ref 98–111)
Creatinine, Ser: 4.27 mg/dL — ABNORMAL HIGH (ref 0.44–1.00)
GFR calc Af Amer: 10 mL/min — ABNORMAL LOW (ref >60)
GFR calc non Af Amer: 9 mL/min — ABNORMAL LOW (ref >60)
GLUCOSE: 119 mg/dL — AB (ref 70–99)
Potassium: 3.5 mmol/L (ref 3.5–5.1)
Sodium: 133 mmol/L — ABNORMAL LOW (ref 135–145)

## 2018-03-09 LAB — GLUCOSE, CAPILLARY
GLUCOSE-CAPILLARY: 96 mg/dL (ref 70–99)
GLUCOSE-CAPILLARY: 158 mg/dL — AB (ref 70–99)
Glucose-Capillary: 141 mg/dL — ABNORMAL HIGH (ref 70–99)
Glucose-Capillary: 100 mg/dL — ABNORMAL HIGH (ref 70–99)

## 2018-03-09 MED ORDER — HEPARIN SODIUM (PORCINE) 1000 UNIT/ML IJ SOLN
INTRAMUSCULAR | Status: AC
  Administered 2018-03-09: 1000 [IU]
  Filled 2018-03-09: qty 4

## 2018-03-09 MED ORDER — MIDODRINE HCL 5 MG PO TABS
ORAL_TABLET | ORAL | Status: AC
  Filled 2018-03-09: qty 2

## 2018-03-09 NOTE — Progress Notes (Signed)
PROGRESS NOTE    [name]  ZOX:[id number] DOB: 26-Oct-1932 DOA: 02/27/2018 PCP: Mila Palmer, MD   Brief Narrative:  HPI on 02/27/2018 by Dr. Jonah Blue [name] is a 84 y.o. female with medical history significant of CVA; PAD s/p balloon angioplasty in 2/19 and 9/19 and R BKA in 7/19; obesity (BMI 32); hyperparathyroidism; DM; and ESRD on HD presenting with wound infection.  She was due to be discharged today.  She went to Dr. Ardelle Anton today and he sent her to the Er for ongoing foot infection.  No fevers.  She has been feeling well.  She has had significant constipation.  Also with pressure ulcers.  Assessment & Plan   Leukocytosis with gangrene of the left foot/heel -Prior history of right BKA, patient had undergone angiography and angioplasty of the left peroneal artery September 2019 -Presented with gangrenous changes of the left heel area with toe, placed on empiric antibiotics -Underwent left BKA on 03/01/2018 for gangrene -Blood cultures remain negative to date -Leukocytosis now trending downward, currently 48.7  Rectal wall thickening, proctitis versus mass -CT abdomen showed rectal wall thickening to the anus with masslike appearance concerning for primary rectal cancer versus acute proctitis -Infectious disease, Dr. Ilsa Iha recommended oral Dificid with IV metronidazole, then changed to oral vancomycin -C. difficile was negative -ID, Dr., recommended to stop vancomycin and monitor off of antibiotics -Gastroenterology consulted and appreciated, s/p flex sigmoidoscopy showed 4mm polyp and recommended outpatient follow up if patient and family wish to do so  Diabetes mellitus, type II peripheral vascular disease -Continue insulin sliding scale with CBG monitoring  End-stage renal disease -Patient dialyzes on Tuesday, Thursday, Saturday -Nephrology consulted and appreciated  Anemia of chronic disease -hemoglobin currently stable, currently 9 -Continue to  monitor CBC  Essential hypertension -patient has had some hypotensive episodes -metoprolol discontinued -currently on midodrine  History of atrial fibrillation -Continue Eliquis  Stage II decubiti to the buttock and sacral areas -Wound care per nursing  Goals of care/CODE STATUS -Palliative care consulted and appreciated.  Discussed with palliative care, family (patient's nieces) have opted to make the patient DNR at this time.  We will continue to treat the treatable and continue dialysis for now.  Would like to have flexible sigmoidoscopy done to evaluate this question of a mass. -plan is for patient to return [facility] with [facility] and hospice/palliative care to follow  -Case management consulted  DVT Prophylaxis  Eliquis  Code Status: DNR  Family Communication: None at bedside  Disposition Plan: Admitted. Likely discharge to home on 11/1 when equipment is delivered  Consultants Palliative care Vascular surgery Nephrology Infectious disease Gastroenterology  Procedures  Left BKA 03/01/2018  Antibiotics   Anti-infectives (From admission, onward)   Start     Dose/Rate Route Frequency Ordered Stop   03/06/18 1530  ciprofloxacin (CIPRO) IVPB 400 mg  Status:  Discontinued     400 mg 200 mL/hr over 60 Minutes Intravenous Every 24 hours 03/05/18 1536 03/05/18 1541   03/06/18 1400  vancomycin (VANCOCIN) 50 mg/mL oral solution 125 mg  Status:  Discontinued     125 mg Oral 4 times daily 03/06/18 1220 03/06/18 1358   03/05/18 2200  fidaxomicin (DIFICID) tablet 200 mg  Status:  Discontinued     200 mg Oral 2 times daily 03/05/18 1545 03/06/18 1220   03/05/18 1530  metroNIDAZOLE (FLAGYL) IVPB 500 mg  Status:  Discontinued     500 mg 100 mL/hr over 60 Minutes Intravenous Every 8 hours  03/05/18 1524 03/06/18 1220   03/05/18 1530  ciprofloxacin (CIPRO) IVPB 400 mg  Status:  Discontinued     400 mg 200 mL/hr over 60 Minutes Intravenous Every 12 hours 03/05/18 1524 03/05/18 1536   03/02/18  1600  piperacillin-tazobactam (ZOSYN) IVPB 3.375 g  Status:  Discontinued     3.375 g 12.5 mL/hr over 240 Minutes Intravenous Every 12 hours 03/02/18 0947 03/05/18 0617   03/02/18 1600  linezolid (ZYVOX) IVPB 600 mg  Status:  Discontinued     600 mg 300 mL/hr over 60 Minutes Intravenous Every 12 hours 03/02/18 0947 03/02/18 1315   03/02/18 1600  linezolid (ZYVOX) IVPB 600 mg  Status:  Discontinued     600 mg 300 mL/hr over 60 Minutes Intravenous Every 12 hours 03/02/18 1315 03/05/18 0617   02/27/18 1600  piperacillin-tazobactam (ZOSYN) IVPB 2.25 g  Status:  Discontinued     2.25 g 100 mL/hr over 30 Minutes Intravenous Every 8 hours 02/27/18 1527 02/27/18 1544   02/27/18 1600  linezolid (ZYVOX) IVPB 600 mg  Status:  Discontinued     600 mg 300 mL/hr over 60 Minutes Intravenous Every 12 hours 02/27/18 1537 03/02/18 0947   02/27/18 1600  piperacillin-tazobactam (ZOSYN) IVPB 3.375 g  Status:  Discontinued     3.375 g 12.5 mL/hr over 240 Minutes Intravenous Every 12 hours 02/27/18 1544 03/02/18 0947      Subjective:   [name] seen and examined today in hemodialysis. No complaints today. Denies chest pain, shortness of breath, abdominal pain, N/V/D/C.   Objective:   Vitals:   03/09/18 0801 03/09/18 0830 03/09/18 0900 03/09/18 0930  BP: (!) 96/56 (!) 101/31 (!) 85/42 (!) 90/43  Pulse: 92 (!) 51 82 77  Resp:      Temp:      TempSrc:      SpO2:      Weight:      Height:        Intake/Output Summary (Last 24 hours) at 03/09/2018 1033 Last data filed at 03/09/2018 0601 Gross per 24 hour  Intake 0 ml  Output 0 ml  Net 0 ml   Filed Weights   03/04/18 0646 03/04/18 1100 03/06/18 2100  Weight: 75.9 kg 75.3 kg 75.2 kg   Exam  General: Well developed, chronically ill appearing, NAD  HEENT: NCAT,  mucous membranes moist.   Neck: Supple  Cardiovascular: S1 S2 auscultated, no rubs, RRR  Respiratory: Clear to auscultation bilaterally with equal chest rise  Abdomen: Soft,  nontender, nondistended, + bowel sounds  Extremities: B/L BKA, L stump with staples. LUE edema  Neuro: AAOx2, nonfocal  Psych: Pleasant, appropriate mood and affect  Data Reviewed: I have personally reviewed following labs and imaging studies  CBC: Recent Labs  Lab 03/05/18 0650 03/06/18 0547 03/07/18 0838 03/08/18 0854 03/09/18 0408  WBC 66.0* 58.1* 48.9* 43.8* 48.7*  NEUTROABS 59.5* 51.1* 45.0* 36.8* 41.4*  HGB 9.5* 9.4* 8.9* 9.1* 9.0*  HCT 34.2* 33.0* 31.8* 30.8* 31.2*  MCV 78.8* 78.9* 76.8* 76.0* 75.5*  PLT 377 332 296 214 200   Basic Metabolic Panel: Recent Labs  Lab 03/05/18 0650 03/06/18 0547 03/07/18 0838 03/08/18 0854 03/09/18 0408  NA 134* 132* 134* 132* 133*  K 3.7 4.0 3.4* 3.3* 3.5  CL 93* 93* 94* 96* 97*  CO2 22 19* 23 23 23   GLUCOSE 197* 156* 159* 154* 119*  BUN 17 34* 57* 33* 44*  CREATININE 3.37* 4.40* 5.33* 3.31* 4.27*  CALCIUM 7.9* 7.7* 7.6* 7.7*  7.6*  PHOS  --   --  2.1*  --   --    GFR: Estimated Creatinine Clearance: 9.5 mL/min (A) (by C-G formula based on SCr of 4.27 mg/dL (H)). Liver Function Tests: Recent Labs  Lab 03/07/18 0838  ALBUMIN 2.0*   No results for input(s): LIPASE, AMYLASE in the last 168 hours. No results for input(s): AMMONIA in the last 168 hours. Coagulation Profile: No results for input(s): INR, PROTIME in the last 168 hours. Cardiac Enzymes: No results for input(s): CKTOTAL, CKMB, CKMBINDEX, TROPONINI in the last 168 hours. BNP (last 3 results) No results for input(s): PROBNP in the last 8760 hours. HbA1C: No results for input(s): HGBA1C in the last 72 hours. CBG: Recent Labs  Lab 03/07/18 1648 03/07/18 2052 03/08/18 0738 03/08/18 1658 03/08/18 2112  GLUCAP 139* 143* 134* 115* 144*   Lipid Profile: No results for input(s): CHOL, HDL, LDLCALC, TRIG, CHOLHDL, LDLDIRECT in the last 72 hours. Thyroid Function Tests: No results for input(s): TSH, T4TOTAL, FREET4, T3FREE, THYROIDAB in the last 72  hours. Anemia Panel: No results for input(s): VITAMINB12, FOLATE, FERRITIN, TIBC, IRON, RETICCTPCT in the last 72 hours. Urine analysis:    Component Value Date/Time   COLORURINE YELLOW 12/17/2006 0005   APPEARANCEUR TURBID (A) 12/17/2006 0005   LABSPEC 1.019 12/17/2006 0005   PHURINE 5.0 12/17/2006 0005   GLUCOSEU NEGATIVE 12/17/2006 0005   HGBUR MODERATE (A) 12/17/2006 0005   BILIRUBINUR SMALL (A) 12/17/2006 0005   KETONESUR 15 (A) 12/17/2006 0005   PROTEINUR NEGATIVE 12/17/2006 0005   UROBILINOGEN 0.2 12/17/2006 0005   NITRITE NEGATIVE 12/17/2006 0005   LEUKOCYTESUR LARGE (A) 12/17/2006 0005   Sepsis Labs: @LABRCNTIP (procalcitonin:4,lacticidven:4)  ) Recent Results (from the past 240 hour(s))  Blood Cultures x 2 sites     Status: None   Collection Time: 02/27/18  5:15 PM  Result Value Ref Range Status   Specimen Description BLOOD RIGHT ANTECUBITAL  Final   Special Requests   Final    BOTTLES DRAWN AEROBIC AND ANAEROBIC Blood Culture adequate volume   Culture   Final    NO GROWTH 5 DAYS Performed at [facility], [address], [place], [place] [address]    Report Status 03/04/2018 FINAL  Final  Blood Cultures x 2 sites     Status: None   Collection Time: 02/27/18  7:10 PM  Result Value Ref Range Status   Specimen Description BLOOD RIGHT FOREARM  Final   Special Requests   Final    BOTTLES DRAWN AEROBIC ONLY Blood Culture adequate volume   Culture   Final    NO GROWTH 5 DAYS Performed at [facility], [address], [place], [place] [address]    Report Status 03/04/2018 FINAL  Final  MRSA PCR Screening     Status: None   Collection Time: 02/27/18 11:04 PM  Result Value Ref Range Status   MRSA by PCR NEGATIVE NEGATIVE Final    Comment:        The GeneXpert MRSA Assay (FDA approved for NASAL specimens only), is one component of a comprehensive MRSA colonization surveillance program. It is not intended to diagnose MRSA infection nor to guide  or monitor treatment for MRSA infections. Performed at [facility], [address] [place], [place] [address]   Culture, blood (routine x 2)     Status: None   Collection Time: 03/02/18  2:30 PM  Result Value Ref Range Status   Specimen Description BLOOD  LEFT ANTECUBITAL  Final   Special Requests   Final    BOTTLES DRAWN AEROBIC ONLY Blood Culture adequate volume   Culture   Final    NO GROWTH 5 DAYS Performed at [facility], [address], [place], [place] [address]    Report Status 03/07/2018 FINAL  Final  Culture, blood (routine x 2)     Status: None   Collection Time: 03/02/18  2:44 PM  Result Value Ref Range Status   Specimen Description BLOOD LEFT ANTECUBITAL  Final   Special Requests   Final    BOTTLES DRAWN AEROBIC ONLY Blood Culture adequate volume   Culture   Final    NO GROWTH 5 DAYS Performed at [facility], [address] [place], [place] [address]    Report Status 03/07/2018 FINAL  Final  C difficile quick scan w PCR reflex     Status: None   Collection Time: 03/05/18  5:18 PM  Result Value Ref Range Status   C Diff antigen NEGATIVE NEGATIVE Final   C Diff toxin NEGATIVE NEGATIVE Final   C Diff interpretation No C. difficile detected.  Final      Radiology Studies: No results found.   Scheduled Meds: . amiodarone  200 mg Oral BID  . apixaban  2.5 mg Oral BID  . Chlorhexidine Gluconate Cloth  6 each Topical Q0600  . cinacalcet  30 mg Oral Q supper  . darbepoetin (ARANESP) injection - DIALYSIS  60 mcg Intravenous Q Tue-HD  . digoxin  0.0625 mg Oral Daily  . doxercalciferol  1 mcg Intravenous Q T,Th,Sa-HD  . feeding supplement (NEPRO CARB STEADY)  237 mL Oral Q24H  . feeding supplement (PRO-STAT SUGAR FREE 64)  30 mL Oral BID  . gabapentin  100 mg Oral QHS  . insulin aspart  0-5 Units Subcutaneous QHS  . insulin aspart  0-9 Units Subcutaneous TID WC  . midodrine      . midodrine  10 mg Oral BID WC  . multivitamin  1 tablet Oral  QHS   Continuous Infusions:   LOS: 10 days   Time Spent in minutes   30 minutes   Maurie Olesen D.O. on 03/09/2018 at 10:32 AM  Between 7am to 7pm - Please see pager noted on [email or web address]  After 7pm go to [email or web address]  And look for the night coverage person covering for me after hours  [facility] Office  [phone]

## 2018-03-09 NOTE — Progress Notes (Deleted)
[name] Progress Note   Subjective: Seen on HD. No C/Os. BP initially low, now improved after midodrine.   Objective Vitals:   03/09/18 0900 03/09/18 0930 03/09/18 1000 03/09/18 1030  BP: (!) 85/42 (!) 90/43 (!) 85/48 (!) 95/41  Pulse: 82 77 91 79  Resp:      Temp:      TempSrc:      SpO2:      Weight:      Height:       Physical Exam General:Chronically ill appearing elderly female in NAD Heart:S1,S2, RRR Lungs:CTAB anteriorly Abdomen:soft, NT Extremities:Bilateral BKA. Well healed R BKA, staples intact L BKA without drainage/edema along suture line.. UE edema noted-hands and forearms.No stump edema. Dialysis Access:RIJ TDC Drsg CDI blood lines connected    Additional Objective Labs: Basic Metabolic Panel: Recent Labs  Lab 03/07/18 0838 03/08/18 0854 03/09/18 0408  NA 134* 132* 133*  K 3.4* 3.3* 3.5  CL 94* 96* 97*  CO2 23 23 23   GLUCOSE 159* 154* 119*  BUN 57* 33* 44*  CREATININE 5.33* 3.31* 4.27*  CALCIUM 7.6* 7.7* 7.6*  PHOS 2.1*  --   --    Liver Function Tests: Recent Labs  Lab 03/07/18 0838  ALBUMIN 2.0*   No results for input(s): LIPASE, AMYLASE in the last 168 hours. CBC: Recent Labs  Lab 03/05/18 0650 03/06/18 0547 03/07/18 0838 03/08/18 0854 03/09/18 0408  WBC 66.0* 58.1* 48.9* 43.8* 48.7*  NEUTROABS 59.5* 51.1* 45.0* 36.8* 41.4*  HGB 9.5* 9.4* 8.9* 9.1* 9.0*  HCT 34.2* 33.0* 31.8* 30.8* 31.2*  MCV 78.8* 78.9* 76.8* 76.0* 75.5*  PLT 377 332 296 214 200   Blood Culture    Component Value Date/Time   SDES BLOOD LEFT ANTECUBITAL 03/02/2018 1444   SPECREQUEST  03/02/2018 1444    BOTTLES DRAWN AEROBIC ONLY Blood Culture adequate volume   CULT  03/02/2018 1444    NO GROWTH 5 DAYS Performed at [facility], [place] [address] [place], [place] [address]    REPTSTATUS 03/07/2018 FINAL 03/02/2018 1444    Cardiac Enzymes: No results for input(s): CKTOTAL, CKMB, CKMBINDEX, TROPONINI in the last 168  hours. CBG: Recent Labs  Lab 03/07/18 1648 03/07/18 2052 03/08/18 0738 03/08/18 1658 03/08/18 2112  GLUCAP 139* 143* 134* 115* 144*   Iron Studies: No results for input(s): IRON, TIBC, TRANSFERRIN, FERRITIN in the last 72 hours. @lablastinr3 @ Studies/Results: No results found. Medications:  . amiodarone  200 mg Oral BID  . apixaban  2.5 mg Oral BID  . Chlorhexidine Gluconate Cloth  6 each Topical Q0600  . cinacalcet  30 mg Oral Q supper  . darbepoetin (ARANESP) injection - DIALYSIS  60 mcg Intravenous Q Tue-HD  . digoxin  0.0625 mg Oral Daily  . doxercalciferol  1 mcg Intravenous Q T,Th,Sa-HD  . feeding supplement (NEPRO CARB STEADY)  237 mL Oral Q24H  . feeding supplement (PRO-STAT SUGAR FREE 64)  30 mL Oral BID  . gabapentin  100 mg Oral QHS  . insulin aspart  0-5 Units Subcutaneous QHS  . insulin aspart  0-9 Units Subcutaneous TID WC  . midodrine      . midodrine  10 mg Oral BID WC  . multivitamin  1 tablet Oral QHS     Dialysis Orders: Garber/Olin T,Th,S 4 hrs 180NRe 400/autoflow 2.0 77 kg 2.0 K/2.25 Ca linear sodium, UFP 2 RIJ TDC -Heparin 5000 units IV TIW -Hectorol 1 mcg IV TIW -Mircera 100 mcg IV q 2 weeks (  last dose 02/16/2018-last HGB 10.4 02/23/2018)  Assessment/Plan: 1. Gangrene L Heel - s/p L BKA 10/23 Dr. Donzetta Matters.  2. Rising WBC - 43>53>66>58today. ID consulting,BC negative. C Diff negative.Currently off all ABX. Per ID-leukocytosis transient process post BKA.   3. Rectal wall thickening-proctitis vs mass. GI following. Flex sigmoidoscopy 03/08/18 1 polyp not removed D/T eliquis. No mass/rectum and sigmoid colon appear normal. Per primary  4. ESRD -on HD TTS.HD today on schedule. K+ 3.5. 4.0 K bath.  5. Anemia of CKD-Hgb 9.0 Aranesp 45mg qwk (tues) Follow trends.  6. Secondary hyperparathyroidism -Ca 7.6 C Ca 9.1 Phos 2.1Continue VDRA,holdbinders, and continuesensipar. 7. Hypotension/volume -on midodrine. Initially hypotensive when HD  started. BP now stable Attempting UFG 2.5Almost 2kg under EDW last HD. Continue to titrate down volume as tolerated. Will need new EDW at d/c.    8. Nutrition -Alb 2.1Renal diet w/fluid restrictions. prostat, Renavite 9. DM - per primary 10. Hx A fib on eliquis. Eliquis resumed. Per primary 11. Rash - on atarax 11.  QOL-Palliative care consulted-Pt now DNR. Palliative team met with family10/30/19/  Now DNR. Continue to treat what is treatable.   Jamie H. Brown NP-C 03/09/2018, 10:49 AM  [phone]

## 2018-03-09 NOTE — Progress Notes (Signed)
Subjective: Seen on HD. No C/Os. BP initially low, now improved after midodrine.   Objective       Vitals:   03/09/18 0900 03/09/18 0930 03/09/18 1000 03/09/18 1030  BP: (!) 85/42 (!) 90/43 (!) 85/48 (!) 95/41  Pulse: 82 77 91 79  Resp:      Temp:      TempSrc:      SpO2:      Weight:      Height:       Physical Exam General:Chronically ill appearing elderly female in NAD Heart:S1,S2, RRR Lungs:CTAB anteriorly Abdomen:soft, NT Extremities:Bilateral BKA. Well healed R BKA, staples intact L BKA without drainage/edema along suture line.. UE edema noted-hands and forearms.No stump edema. Dialysis Access:RIJ TDC Drsg CDI blood lines connected    Additional Objective Labs: Basic Metabolic Panel: LastLabs  Recent Labs  Lab 03/07/18 0838 03/08/18 0854 03/09/18 0408  NA 134* 132* 133*  K 3.4* 3.3* 3.5  CL 94* 96* 97*  CO2 23 23 23   GLUCOSE 159* 154* 119*  BUN 57* 33* 44*  CREATININE 5.33* 3.31* 4.27*  CALCIUM 7.6* 7.7* 7.6*  PHOS 2.1*  --   --      Liver Function Tests: LastLabs     Recent Labs  Lab 03/07/18 0838  ALBUMIN 2.0*     LastLabs  No results for input(s): LIPASE, AMYLASE in the last 168 hours.   CBC: LastLabs         Recent Labs  Lab 03/05/18 0650 03/06/18 0547 03/07/18 0838 03/08/18 0854 03/09/18 0408  WBC 66.0* 58.1* 48.9* 43.8* 48.7*  NEUTROABS 59.5* 51.1* 45.0* 36.8* 41.4*  HGB 9.5* 9.4* 8.9* 9.1* 9.0*  HCT 34.2* 33.0* 31.8* 30.8* 31.2*  MCV 78.8* 78.9* 76.8* 76.0* 75.5*  PLT 377 332 296 214 200     Blood Culture Labs(Brief)           Component Value Date/Time   SDES BLOOD LEFT ANTECUBITAL 03/02/2018 1444   SPECREQUEST  03/02/2018 1444    BOTTLES DRAWN AEROBIC ONLY Blood Culture adequate volume   CULT  03/02/2018 1444    NO GROWTH 5 DAYS Performed at [facility], [place] [address], [place], [place] [address]    REPTSTATUS 03/07/2018 FINAL 03/02/2018 1444       Cardiac Enzymes: LastLabs  No results for input(s): CKTOTAL, CKMB, CKMBINDEX, TROPONINI in the last 168 hours.   CBG: LastLabs  Recent Labs  Lab 03/07/18 1648 03/07/18 2052 03/08/18 0738 03/08/18 1658 03/08/18 2112  GLUCAP 139* 143* 134* 115* 144*     Iron Studies:  RecentLabs(last2labs)  No results for input(s): IRON, TIBC, TRANSFERRIN, FERRITIN in the last 72 hours.   @lablastinr3 @ Studies/Results: ImagingResults(Last48hours)  No results found.   Medications: . amiodarone  200 mg Oral BID  . apixaban  2.5 mg Oral BID  . Chlorhexidine Gluconate Cloth  6 each Topical Q0600  . cinacalcet  30 mg Oral Q supper  . darbepoetin (ARANESP) injection - DIALYSIS  60 mcg Intravenous Q Tue-HD  . digoxin  0.0625 mg Oral Daily  . doxercalciferol  1 mcg Intravenous Q T,Th,Sa-HD  . feeding supplement (NEPRO CARB STEADY)  237 mL Oral Q24H  . feeding supplement (PRO-STAT SUGAR FREE 64)  30 mL Oral BID  . gabapentin  100 mg Oral QHS  . insulin aspart  0-5 Units Subcutaneous QHS  . insulin aspart  0-9 Units Subcutaneous TID WC  . midodrine      . midodrine  10 mg Oral  BID WC  . multivitamin  1 tablet Oral QHS     Dialysis Orders: Garber/Olin T,Th,S 4 hrs 180NRe 400/autoflow 2.0 77 kg 2.0 K/2.25 Ca linear sodium, UFP 2 RIJ TDC -Heparin 5000 units IV TIW -Hectorol 1 mcg IV TIW -Mircera 100 mcg IV q 2 weeks (last dose 02/16/2018-last HGB 10.4 02/23/2018)  Assessment/Plan: 1. Gangrene L Heel - s/p L BKA 10/23 Dr. Donzetta Matters. 2. Rising WBC - 43>53>66>58today. ID consulting,BC negative. C Diff negative.Currently off all ABX. Per ID-leukocytosis transient process post BKA. 3. Rectal wall thickening-proctitis vs mass. GI following. Flex sigmoidoscopy 03/08/18 1 polyp not removed D/T eliquis. No mass/rectum and sigmoid colon appear normal. Per primary 4. ESRD -on HD TTS.HD today on schedule.K+ 3.5. 4.0 K bath.  5. Anemia of CKD-Hgb 9.0 Aranesp 44mg  qwk (tues) Follow trends.  6. Secondary hyperparathyroidism -Ca 7.6 C Ca 9.1 Phos 2.1Continue VDRA,holdbinders, and continuesensipar. 7. Hypotension/volume -on midodrine. Initially hypotensive when HD started. BP now stable Attempting UFG 2.5Almost 2kg under EDW last HD. Continue to titrate down volume as tolerated. Will need new EDW at d/c.  8. Nutrition -Alb 2.1Renal diet w/fluid restrictions. prostat, Renavite 9. DM - per primary 10. Hx A fib on eliquis. Eliquis resumed. Per primary 11. Rash - on atarax 11.QOL-Palliative care consulted-Pt now DNR. Palliative team met with family10/30/19/  Now DNR. Continue to treat what is treatable.   Rita H. Brown NP-C 03/09/2018, 10:49 AM  [phone]

## 2018-03-09 NOTE — Care Management (Signed)
Received a message  from Attending that niece [name] is concerned that home hospital bed and wheelchair are not "working". Yesterday case manager spoke to Tanaina and son [name] [phone]) and both stated they needed hospital bed , wheelchair and hoyer lift with amputee sling , all DME was ordered. Spoke to [place] with [facility] and was told when [facility] called to deliver DME they were told patient already has hospital and wheelchair, they only wanted hoyer lift and sling. Called Myrtle at phone number listed in [facility] no answer and voicemail full. Called Taye . Alwyn Ren has already spoken to [facility] and told [facility] they do need hospital bed and wheelchair. Both will be delivered today between 1300 and 1700.   Lupita Leash with [facility] verified that hospital bed and wheelchair will be delivered today between 1300 and 1700.   Per MD discharge is tomorrow. NCM explained to Taye discharge is tomorrow 03/10/18 , DME needs to be in place today, and family ready. Taye voiced understanding.   Ronny Flurry RN BSN [phone]

## 2018-03-10 ENCOUNTER — Encounter (HOSPITAL_COMMUNITY): Payer: Medicare Other

## 2018-03-10 ENCOUNTER — Ambulatory Visit: Payer: Medicare Other | Admitting: Family

## 2018-03-10 DIAGNOSIS — E1129 Type 2 diabetes mellitus with other diabetic kidney complication: Secondary | ICD-10-CM | POA: Diagnosis not present

## 2018-03-10 DIAGNOSIS — N186 End stage renal disease: Secondary | ICD-10-CM | POA: Diagnosis not present

## 2018-03-10 DIAGNOSIS — Z992 Dependence on renal dialysis: Secondary | ICD-10-CM | POA: Diagnosis not present

## 2018-03-10 LAB — GLUCOSE, CAPILLARY: Glucose-Capillary: 132 mg/dL — ABNORMAL HIGH (ref 70–99)

## 2018-03-10 MED ORDER — RENA-VITE PO TABS: 1.0000 | ORAL_TABLET | Freq: Every day | ORAL | 0 refills | Status: AC

## 2018-03-10 NOTE — Progress Notes (Addendum)
I have seen and examined this patient and agree with the plan of care patient stable for discharge continue outpatient dialysis therapy  Sherril Croon 03/10/2018, 2:01 PM  [facility] Progress Note   Subjective: No new complaints. DC home today.   Objective Vitals:   03/10/18 0035 03/10/18 0045 03/10/18 0411 03/10/18 0747  BP:   131/66 119/81  Pulse: 65  81 96  Resp:   16 20  Temp:   98.5 F (36.9 C) (!) 97.5 F (36.4 C)  TempSrc:   Oral Oral  SpO2: (!) 61% 95%  94%  Weight:      Height:       Physical Exam General:Chronically ill appearing elderly female in NAD Heart:S1,S2, RRR Lungs:CTABanteriorly Abdomen:soft, NT Extremities:Bilateral BKA. Well healed R BKA, staples intact L BKA without drainage/edema along suture line.. UE edema noted-hands and forearms.No stump edema. Dialysis Access:RIJ [facility] Drsg CDI    Additional Objective Labs: Basic Metabolic Panel: Recent Labs  Lab 03/07/18 0838 03/08/18 0854 03/09/18 0408  NA 134* 132* 133*  K 3.4* 3.3* 3.5  CL 94* 96* 97*  CO2 _0 GLUCOSE 159* 154* 119*  BUN 57* 33* 44*  CREATININE 5.33* 3.31* 4.27*  CALCIUM 7.6* 7.7* 7.6*  PHOS 2.1*  --   --    Liver Function Tests: Recent Labs  Lab 03/07/18 0838  ALBUMIN 2.0*   No results for input(s): LIPASE, AMYLASE in the last 168 hours. CBC: Recent Labs  Lab 03/05/18 0650 03/06/18 0547 03/07/18 0838 03/08/18 0854 03/09/18 0408  WBC 66.0* 58.1* 48.9* 43.8* 48.7*  NEUTROABS 59.5* 51.1* 45.0* 36.8* 41.4*  HGB 9.5* 9.4* 8.9* 9.1* 9.0*  HCT 34.2* 33.0* 31.8* 30.8* 31.2*  MCV 78.8* 78.9* 76.8* 76.0* 75.5*  PLT 377 332 296 214 200   Blood Culture    Component Value Date/Time   SDES BLOOD LEFT ANTECUBITAL 03/02/2018 1444   SPECREQUEST  03/02/2018 1444    BOTTLES DRAWN AEROBIC ONLY Blood Culture adequate volume   CULT  03/02/2018 1444    NO GROWTH 5 DAYS Performed at [facility], [place] [address] [place], [place]  [address]    REPTSTATUS 03/07/2018 FINAL 03/02/2018 1444    Cardiac Enzymes: No results for input(s): CKTOTAL, CKMB, CKMBINDEX, TROPONINI in the last 168 hours. CBG: Recent Labs  Lab 03/08/18 2112 03/09/18 1235 03/09/18 1705 03/09/18 2124 03/10/18 0749  GLUCAP 144* 100* 96 158* 132*   Iron Studies: No results for input(s): IRON, TIBC, TRANSFERRIN, FERRITIN in the last 72 hours. _1 @ Studies/Results: No results found. Medications:  . amiodarone  200 mg Oral BID  . apixaban  2.5 mg Oral BID  . Chlorhexidine Gluconate Cloth  6 each Topical Q0600  . cinacalcet  30 mg Oral Q supper  . darbepoetin (ARANESP) injection - DIALYSIS  60 mcg Intravenous Q Tue-HD  . digoxin  0.0625 mg Oral Daily  . doxercalciferol  1 mcg Intravenous Q T,Th,Sa-HD  . feeding supplement (NEPRO CARB STEADY)  237 mL Oral Q24H  . feeding supplement (PRO-STAT SUGAR FREE 64)  30 mL Oral BID  . gabapentin  100 mg Oral QHS  . insulin aspart  0-5 Units Subcutaneous QHS  . insulin aspart  0-9 Units Subcutaneous TID WC  . midodrine  10 mg Oral BID WC  . multivitamin  1 tablet Oral QHS     Dialysis Orders: Garber/Olin T,Th,S 4 hrs 180NRe 400/autoflow 2.0 77 kg 2.0 K/2.25 Ca linear sodium, UFP 2 RIJ TDC -  Heparin 5000 units IV TIW -Hectorol 1 mcg IV TIW -Mircera 100 mcg IV q 2 weeks (last dose 02/16/2018-last HGB 10.4 02/23/2018)  Assessment/Plan: 1. Gangrene L Heel - s/p L BKA 10/23 Dr. Donzetta Matters. 2. Rising WBC - 43>53>66>58today. ID consulting,BC negative. C Diff negative.Currently off all ABX. Per ID-leukocytosis transient process post BKA. 3. Rectal wall thickening-proctitis vs mass. GI following. Flex sigmoidoscopy10/30/19 1 polyp not removed D/T eliquis. No mass/rectum and sigmoid colon appear normal. Per primary 4. ESRD -on HD TTS.Next HD at [place] 03/11/18.  5. Anemia of CKD-Hgb 9.0Aranesp 12mg qwk (tues) Follow trends.  6. Secondary hyperparathyroidism -Ca 7.6 C Ca 9.1 Phos  2.1Continue VDRA,holdbinders, and continuesensipar. 7. Hypotension/volume -on midodrine.Initially hypotensive when HD started. BP now stable Appear euvolemic. Lower EDW at DC.  8. Nutrition -Alb 2.1Renal diet w/fluid restrictions. prostat, Renavite 9. DM - per primary 10. Hx A fib on eliquis. Eliquis resumed. Per primary 11. Rash - on atarax 11.QOL-Palliative care consulted-Pt now DNR. Palliative teammet with family10/30/19/  Now DNR. Continue to treat what is treatable.  Disposition: DC today with home health and palliative care.   Rita H. Brown NP-C 03/10/2018, 12:03 PM  [phone]

## 2018-03-10 NOTE — Care Management Note (Addendum)
Case Management Note  Patient Details  Name: GWENITH TSCHIDA MRN: [id number] Date of Birth: 1932-11-05  Subjective/Objective:                    Action/Plan: Spoke to patient's niece [name] and son [name] via phone. Confirmed all DME has been delivered. [name] is at home [address] [facility] [address] waiting on his mother's arrival.  Pulse oxygenation saturation 95% on room air. Discussed with MD no need for home oxygen.   PTAR paperwork and gold DNR form on shadow chart. Nurse requesting PTAR to pick up at noon. Called PTAR request made.  Lupita Leash with [facility]  aware discharge is today.  And left message for [facility] with Palliative Care at [facility]   Stacey with [facility] returned call. She received a call from DR [facility] office. Dr Paulino Rily will not sign Palliative Care orders , because she has not seen patient recently. Notified patient's son [name]. He will call Dr Paulino Rily office and schedule appointment.  Expected Discharge Date:  03/10/18               Expected Discharge Plan:  Home w Home Health Services  In-House Referral:  Hospice / Palliative Care  Discharge planning Services  CM Consult  Post Acute Care Choice:  Home Health, Durable Medical Equipment Choice offered to:  Patient, Adult Children  DME Arranged:  Hospital bed, Lightweight manual wheelchair with seat cushion DME Agency:  [facility]  HH Arranged:  RN, Disease Management, PT, OT, Nurse's Aide, Social Work [employer] Agency:  [facility] [place]  Status of Service:  Completed, signed off  If discussed at [employer], dates discussed:    Additional Comments:  Kingsley Plan, RN 03/10/2018, 10:14 AM

## 2018-03-10 NOTE — Progress Notes (Signed)
Physical Therapy Treatment Patient Details Name: [name] MRN: [id number] DOB: Jun 27, 1932 Today's Date: 03/10/2018    History of Present Illness This 84 y.o. female admitted from SNF presents with wound infection Lt foot heel with ongoing leukocytosis.  SHe underwent Lt BKA 03/01/18.  PMH includes s/p Rt BKA, CVA, PAD, s/p balloon angioplasty, DM, ESRD with HD     PT Comments    Patient progressing this session as more alert and participating in mobility in the bed.  No pain initially, but after sitting EOB on air mattress pain on buttocks and too painful to practice scooting transfers.  She will need 24/7 attentive care at home and follow up HHPT.    Follow Up Recommendations  Home health PT;Supervision/Assistance - 24 hour     Equipment Recommendations  Wheelchair (measurements PT);Wheelchair cushion (measurements PT);Hospital bed;Other (comment)(hoyer)    Recommendations for Other Services       Precautions / Restrictions Precautions Precautions: Fall Precaution Comments: bilateral BKA    Mobility  Bed Mobility Overal bed mobility: Needs Assistance Bed Mobility: Rolling;Supine to Sit;Sit to Supine Rolling: Mod assist;+2 for physical assistance   Supine to sit: Mod assist Sit to supine: Mod assist;+2 for safety/equipment   General bed mobility comments: lifting trunk upright initially with HOB already elevated some; assist to move legs over and scoot hips to sit on EOB  Transfers                 General transfer comment: NT for OOB as pt to d/c today and having lots of pain on bottom with attempted lateral scooting  Ambulation/Gait                 Stairs             Wheelchair Mobility    Modified Rankin (Stroke Patients Only)       Balance Overall balance assessment: Needs assistance Sitting-balance support: Bilateral upper extremity supported;No upper extremity supported Sitting balance-Leahy Scale: Poor Sitting balance -  Comments: sits with posterior pelvic tilt, rounded spine and shoulders and head forward to offset posterior tilt; assist for balance with UE at times or min A; sits with S at the least for safety                                    Cognition Arousal/Alertness: Lethargic Behavior During Therapy: [facility] for tasks assessed/performed Overall Cognitive Status: No family/caregiver present to determine baseline cognitive functioning                                 General Comments: asleep and difficulty to arouse initially, but once aroused, participating as tolerated      Exercises Other Exercises Other Exercises: supine hip flex with knee flex/ext x 10 each leg    General Comments General comments (skin integrity, edema, etc.): was happy and comfortable until attempting scooting in sitting EOB and began c/o pain on buttock, in supine rolled, assisted for hygiene and placed clean pad, RN tech informed needs skin/wound care      Pertinent Vitals/Pain Faces Pain Scale: Hurts even more Pain Location: bottom with scooting Pain Descriptors / Indicators: Grimacing;Guarding;Crying Pain Intervention(s): Monitored during session;Repositioned;Limited activity within patient's tolerance    Home Living  Prior Function            PT Goals (current goals can now be found in the care plan section) Progress towards PT goals: Progressing toward goals    Frequency    Min 3X/week      PT Plan Current plan remains appropriate    Co-evaluation              AM-PAC PT "6 Clicks" Daily Activity  Outcome Measure  Difficulty turning over in bed (including adjusting bedclothes, sheets and blankets)?: Unable Difficulty moving from lying on back to sitting on the side of the bed? : Unable Difficulty sitting down on and standing up from a chair with arms (e.g., wheelchair, bedside commode, etc,.)?: Unable Help needed moving to and from a  bed to chair (including a wheelchair)?: Total Help needed walking in hospital room?: Total Help needed climbing 3-5 steps with a railing? : Total 6 Click Score: 6    End of Session   Activity Tolerance: Patient limited by pain Patient left: in bed;with call bell/phone within reach Nurse Communication: Other (comment)(wounds on buttock) PT Visit Diagnosis: Muscle weakness (generalized) (M62.81);Pain Pain - Right/Left: Left Pain - part of body: Leg     Time: 9528-4132 PT Time Calculation (min) (ACUTE ONLY): 28 min  Charges:  $Therapeutic Exercise: 8-22 mins $Therapeutic Activity: 8-22 mins                     Sheran Lawless, [facility] [phone] 03/10/2018    Elray Mcgregor 03/10/2018, 11:10 AM

## 2018-03-10 NOTE — Discharge Summary (Signed)
Physician Discharge Summary  [name] ZOX:[id number] DOB: 11-12-1932 DOA: 02/27/2018  PCP: Mila Palmer, MD  Admit date: 02/27/2018 Discharge date: 03/10/2018  Time spent: 45 minutes  Recommendations for Outpatient Follow-up:  Patient will be discharged to home with home health physical therapy and palliative care to follow.  Patient will need to follow up with primary care provider within one week of discharge, repeat CBC.  Follow up with gastroenterology as needed.  Follow up with vascular surgery in 3-4 weeks. Patient should continue medications as prescribed.  Patient should follow a renal/carb modified diet.   Discharge Diagnoses:  Leukocytosis with gangrene of the left foot/heel Rectal wall thickening, proctitis versus mass Diabetes mellitus, type II peripheral vascular disease End-stage renal disease Anemia of chronic disease Essential hypertension History of atrial fibrillation Stage II decubiti to the buttock and sacral areas Goals of care/CODE STATUS  Discharge Condition: Stable  Diet recommendation: renal/carb modified  Filed Weights   03/04/18 0646 03/04/18 1100 03/06/18 2100  Weight: 75.9 kg 75.3 kg 75.2 kg    History of present illness:  on 02/27/2018 by Dr. Sheppard Evens a 84 y.o.femalewith medical history significant ofCVA; PAD s/p balloon angioplasty in 2/19 and 9/19 and R BKA in 7/19; obesity (BMI 32); hyperparathyroidism; DM; and ESRD on HD presenting with wound infection.She was due to be discharged today. She went to Dr. Ardelle Anton today and he sent her to the Er for ongoing foot infection. No fevers. She has been feeling well. She has had significant constipation. Also with pressure ulcers.  Hospital Course:  Leukocytosis with gangrene of the left foot/heel -Prior history of right BKA, patient had undergone angiography and angioplasty of the left peroneal artery September 2019 -Presented with gangrenous changes of the left  heel area with toe, placed on empiric antibiotics -Underwent left BKA on 03/01/2018 for gangrene -plavix held, patient will need to follow up with PCP in regards to resumption (given that she is also on Eliquis) -Blood cultures remain negative to date -Leukocytosis now trending downward, currently 48.7 -repeat CBC in one week  Rectal wall thickening, proctitis versus mass -CT abdomen showed rectal wall thickening to the anus with masslike appearance concerning for primary rectal cancer versus acute proctitis -Infectious disease, Dr. Ilsa Iha recommended oral Dificid with IV metronidazole, then changed to oral vancomycin -C. difficile was negative -ID, Dr. Luciana Axe recommended to stop vancomycin and monitor off of antibiotics -Gastroenterology consulted and appreciated, s/p flex sigmoidoscopy showed 4mm polyp and recommended outpatient follow up if patient and family wish to do so  Diabetes mellitus, type II peripheral vascular disease -Continue insulin sliding scale with CBG monitoring  End-stage renal disease -Patient dialyzes on Tuesday, Thursday, Saturday -Nephrology consulted and appreciated  Anemia of chronic disease -hemoglobin currently stable, currently 9  Essential hypertension -patient has had some hypotensive episodes -metoprolol discontinued -currently on midodrine  History of atrial fibrillation -Continue Eliquis  Stage II decubiti to the buttock and sacral areas -Wound care per nursing  Goals of care/CODE STATUS -Palliative care consulted and appreciated.  Discussed with palliative care, family (patient's nieces) have opted to make the patient DNR at this time.  We will continue to treat the treatable and continue dialysis for now.  Would like to have flexible sigmoidoscopy done to evaluate this question of a mass. -plan is for patient to return home with [facility] and hospice/palliative care to follow  -Case management consulted  Code status:  DNR  Consultants Palliative care Vascular surgery Nephrology Infectious disease  Gastroenterology  Procedures  Left BKA 03/01/2018   Discharge Exam: Vitals:   03/10/18 0411 03/10/18 0747  BP: 131/66 119/81  Pulse: 81 96  Resp: 16 20  Temp: 98.5 F (36.9 C) (!) 97.5 F (36.4 C)  SpO2:  94%   Patient anxious to go home today. Denies current chest pain, shortness of breath, abdominal pain, N/V/D/C, dizziness, headache.   General: Well developed, chronically ill appearing, NAD  HEENT: NCAT, mucous membranes moist.  Neck: Supple  Cardiovascular: S1 S2 auscultated, RRR, no murmur.  Respiratory: Clear to auscultation bilaterally with equal chest rise  Abdomen: Soft, nontender, nondistended, + bowel sounds  Extremities: B/L BKA, L stump with staples.   Neuro: AAOx2, nonfocal  Psych: Normal affect and demeanor, pleasant   Discharge Instructions Discharge Instructions    Discharge instructions   Complete by:  As directed    Patient will be discharged to home with home health physical therapy and palliative care to follow.  Patient will need to follow up with primary care provider within one week of discharge, repeat CBC.  Follow up with gastroenterology as needed.  Follow up with vascular surgery in 3-4 weeks. Patient should continue medications as prescribed.  Patient should follow a renal/carb modified diet.     Allergies as of 03/10/2018      Reactions   Codeine Hives   Vancomycin Rash      Medication List    STOP taking these medications   clopidogrel 75 MG tablet Commonly known as:  PLAVIX   metoprolol tartrate 25 MG tablet Commonly known as:  LOPRESSOR     TAKE these medications   acetaminophen 325 MG tablet Commonly known as:  TYLENOL Take 650 mg by mouth every 6 (six) hours as needed for fever.   amiodarone 200 MG tablet Commonly known as:  PACERONE Take 1 tablet (200 mg total) by mouth 2 (two) times daily.   apixaban 2.5 MG Tabs  tablet Commonly known as:  ELIQUIS Take 1 tablet (2.5 mg total) by mouth 2 (two) times daily.   bisacodyl 10 MG suppository Commonly known as:  DULCOLAX Place 10 mg rectally as needed for moderate constipation.   cinacalcet 30 MG tablet Commonly known as:  SENSIPAR Take 1 tablet (30 mg total) by mouth every evening.   digoxin 0.125 MG tablet Commonly known as:  LANOXIN Take 0.0625 mg by mouth daily.   feeding supplement (NEPRO CARB STEADY) Liqd Take 237 mLs by mouth daily.   feeding supplement (PRO-STAT SUGAR FREE 64) Liqd Take 30 mLs by mouth daily.   gabapentin 100 MG capsule Commonly known as:  NEURONTIN Take 1 capsule (100 mg total) by mouth at bedtime.   midodrine 10 MG tablet Commonly known as:  PROAMATINE Take 1 tablet (10 mg total) by mouth 2 (two) times daily with a meal.   multivitamin Tabs tablet Take 1 tablet by mouth at bedtime.   oxyCODONE-acetaminophen 5-325 MG tablet Commonly known as:  PERCOCET/ROXICET Take 1 tablet by mouth every 6 (six) hours as needed. What changed:  reasons to take this   RENVELA 800 MG tablet Generic drug:  sevelamer carbonate Take 2-3 tablets (1,600-2,400 mg total) by mouth See admin instructions. Take 2400 mg by mouth 3 times daily with meals and take 1600 mg by mouth with snacks   sennosides-docusate sodium 8.6-50 MG tablet Commonly known as:  SENOKOT-S Take 2 tablets by mouth 2 (two) times daily.            Durable  Medical Equipment  (From admission, onward)         Start     Ordered   03/08/18 1425  For home use only DME Hospital bed  Once    Comments:  Needs amputee sling ( regular sling)  Call son Alwyn Ren [phone] for delivery  [address], [place] [address]   Stage II decubiti to the buttock and sacral areas  Question Answer Comment  Patient has (list medical condition): gangrene of the left foot/heel and history of right below knee amputation, Stage II decubiti to the buttock and sacral  areas   The above medical condition requires: Patient requires the ability to reposition frequently   Head must be elevated greater than: 45 degrees   Bed type Semi-electric   Hoyer Lift Yes   Support Surface: Low Air loss Mattress      03/08/18 1424   03/08/18 1201  For home use only DME lightweight manual wheelchair with seat cushion  Once    Comments:  Patient suffers from Leukocytosis with gangrene of the left foot/heel  s/p L BKA 10/23 and history of right below knee amputation  which impairs their ability to perform daily activities like ambulating  in the home.  A walker  will not resolve  issue with performing activities of daily living. A wheelchair will allow patient to safely perform daily activities. Patient is not able to propel themselves in the home using a standard weight wheelchair due to  gangrene of the left foot/heel and history of right below knee amputation and weakness  . Patient can self propel in the lightweight wheelchair.  Accessories: elevating leg rests (ELRs), wheel locks, extensions and anti-tippers.   03/08/18 1200         Allergies  Allergen Reactions  . Codeine Hives  . Vancomycin Rash   Follow-up Information    Mila Palmer, MD.   Specialty:  Family Medicine Contact information: [address] [place] [address] [phone]        Maeola Harman, MD.   Specialties:  Vascular Surgery, Cardiology Contact information: [address] [place] [address] [phone]        [facility] - Dme Follow up.   Why:  Provide home health Contact information: [address] [place] [address] [phone]        [facility] Follow up.   Contact information: [address] [place] [address] [phone]       Mila Palmer, MD .   Specialty:  Family Medicine Contact information: [address] [place]  [address] [phone]            The results of significant diagnostics from this hospitalization (including imaging, microbiology, ancillary and laboratory) are listed below for reference.    Significant Diagnostic Studies: Ct Abdomen Pelvis Wo Contrast  Result Date: 03/05/2018 CLINICAL DATA:  Abdominal pain. Leukocytosis. Clinical concern for colitis. Dialysis patient. EXAM: CT ABDOMEN AND PELVIS WITHOUT CONTRAST TECHNIQUE: Multidetector CT imaging of the abdomen and pelvis was performed following the standard protocol without IV contrast. COMPARISON:  12/30/2006. FINDINGS: Lower chest: Dense coronary artery calcifications. Mild bilateral lower lobe atelectasis. Small right pleural effusion. Hepatobiliary: Multiple gallstones in the gallbladder. The largest measures 1.9 cm. No gallbladder wall thickening or pericholecystic fluid. Diffuse arterial calcifications in the hepatic arteries. Otherwise, unremarkable liver. Pancreas: Unremarkable. No pancreatic ductal dilatation or surrounding inflammatory changes. Spleen:  Normal in size without focal abnormality. Arterial calcifications. Adrenals/Urinary Tract: Normal appearing adrenal glands. Small kidneys. No significant urine in the urinary bladder. Unremarkable ureters. Stomach/Bowel: Scattered colonic diverticula. No evidence of diverticulitis or appendicitis. Pronounced low density concentric inferior rectal wall thickening with a maximum thickness of 1.9 cm on image number 85 series 3, with marked luminal narrowing, extending to the anus. On coronal image number 76, this has a mass-like appearance, measuring 4.5 x 4.5 cm . Unremarkable stomach and small bowel. Vascular/Lymphatic: Extensive arterial calcifications. No aneurysm or enlarged lymph nodes. Reproductive: Multiple small uterine fibroids. Normal appearing ovaries. Other: No abdominal wall hernia or abnormality. No abdominopelvic ascites. Musculoskeletal: Lumbar and lower thoracic spine  degenerative changes. Diffuse osteopenia. IMPRESSION: 1. Pronounced low density concentric inferior rectal wall thickening with marked luminal narrowing, extending to the anus. This has a mass-like appearance. This is concerning for a primary rectal carcinoma or marked focal proctitis. 2. Cholelithiasis without evidence of cholecystitis. 3. Colonic diverticulosis. 4. Extensive atheromatous arterial calcifications, including the coronary arteries. 5. Small right pleural effusion. 6. Mild bilateral lower lobe atelectasis. Electronically Signed   By: Beckie Salts M.D.   On: 03/05/2018 13:36   Dg Foot 2 Views Left  Result Date: 02/27/2018 Please see detailed radiograph report in office note.   Microbiology: Recent Results (from the past 240 hour(s))  Culture, blood (routine x 2)     Status: None   Collection Time: 03/02/18  2:30 PM  Result Value Ref Range Status   Specimen Description BLOOD LEFT ANTECUBITAL  Final   Special Requests   Final    BOTTLES DRAWN AEROBIC ONLY Blood Culture adequate volume   Culture   Final    NO GROWTH 5 DAYS Performed at [facility], [address], [place], [place] [address]    Report Status 03/07/2018 FINAL  Final  Culture, blood (routine x 2)     Status: None   Collection Time: 03/02/18  2:44 PM  Result Value Ref Range Status   Specimen Description BLOOD LEFT ANTECUBITAL  Final   Special Requests   Final    BOTTLES DRAWN AEROBIC ONLY Blood Culture adequate volume   Culture   Final    NO GROWTH 5 DAYS Performed at [facility], [address], [place], [place] [address]    Report Status 03/07/2018 FINAL  Final  C difficile quick scan w PCR reflex     Status: None   Collection Time: 03/05/18  5:18 PM  Result Value Ref Range Status   C Diff antigen NEGATIVE NEGATIVE Final   C Diff toxin NEGATIVE NEGATIVE Final   C Diff interpretation No C. difficile detected.  Final     Labs: Basic Metabolic Panel: Recent Labs  Lab 03/05/18 0650  03/06/18 0547 03/07/18 0838 03/08/18 0854 03/09/18 0408  NA 134* 132* 134* 132* 133*  K 3.7 4.0 3.4* 3.3* 3.5  CL 93* 93* 94* 96* 97*  CO2 22 19* 23 23 23   GLUCOSE 197* 156* 159* 154* 119*  BUN 17 34* 57* 33* 44*  CREATININE 3.37* 4.40* 5.33* 3.31* 4.27*  CALCIUM 7.9* 7.7* 7.6* 7.7* 7.6*  PHOS  --   --  2.1*  --   --    Liver Function Tests: Recent Labs  Lab 03/07/18 0838  ALBUMIN 2.0*   No results for input(s): LIPASE, AMYLASE in the last 168 hours. No results for input(s): AMMONIA in the last 168 hours. CBC: Recent Labs  Lab 03/05/18 [phone]  03/06/18 0547 03/07/18 0838 03/08/18 0854 03/09/18 0408  WBC 66.0* 58.1* 48.9* 43.8* 48.7*  NEUTROABS 59.5* 51.1* 45.0* 36.8* 41.4*  HGB 9.5* 9.4* 8.9* 9.1* 9.0*  HCT 34.2* 33.0* 31.8* 30.8* 31.2*  MCV 78.8* 78.9* 76.8* 76.0* 75.5*  PLT 377 332 296 214 200   Cardiac Enzymes: No results for input(s): CKTOTAL, CKMB, CKMBINDEX, TROPONINI in the last 168 hours. BNP: BNP (last 3 results) No results for input(s): BNP in the last 8760 hours.  ProBNP (last 3 results) No results for input(s): PROBNP in the last 8760 hours.  CBG: Recent Labs  Lab 03/08/18 2112 03/09/18 1235 03/09/18 1705 03/09/18 2124 03/10/18 0749  GLUCAP 144* 100* 96 158* 132*       Signed:  Kieanna Rollo  [facility] 03/10/2018, 9:05 AM

## 2018-03-10 NOTE — Progress Notes (Signed)
Patient discharged to home by [facility] , son is at home awaiting her arrival . Report was given to son. No signs and symptoms of distress noted. Patient educated to return to the ED in the case of an emergency. Norman Clay RN

## 2018-03-11 DIAGNOSIS — N186 End stage renal disease: Secondary | ICD-10-CM | POA: Diagnosis not present

## 2018-03-11 DIAGNOSIS — D631 Anemia in chronic kidney disease: Secondary | ICD-10-CM | POA: Diagnosis not present

## 2018-03-11 DIAGNOSIS — N2581 Secondary hyperparathyroidism of renal origin: Secondary | ICD-10-CM | POA: Diagnosis not present

## 2018-03-12 DIAGNOSIS — N186 End stage renal disease: Secondary | ICD-10-CM | POA: Diagnosis not present

## 2018-03-12 DIAGNOSIS — Z4781 Encounter for orthopedic aftercare following surgical amputation: Secondary | ICD-10-CM | POA: Diagnosis not present

## 2018-03-12 DIAGNOSIS — I120 Hypertensive chronic kidney disease with stage 5 chronic kidney disease or end stage renal disease: Secondary | ICD-10-CM | POA: Diagnosis not present

## 2018-03-12 DIAGNOSIS — L89892 Pressure ulcer of other site, stage 2: Secondary | ICD-10-CM | POA: Diagnosis not present

## 2018-03-12 DIAGNOSIS — L89152 Pressure ulcer of sacral region, stage 2: Secondary | ICD-10-CM | POA: Diagnosis not present

## 2018-03-12 DIAGNOSIS — Z79899 Other long term (current) drug therapy: Secondary | ICD-10-CM | POA: Diagnosis not present

## 2018-03-12 DIAGNOSIS — Z992 Dependence on renal dialysis: Secondary | ICD-10-CM | POA: Diagnosis not present

## 2018-03-12 DIAGNOSIS — I4891 Unspecified atrial fibrillation: Secondary | ICD-10-CM | POA: Diagnosis not present

## 2018-03-12 DIAGNOSIS — E1151 Type 2 diabetes mellitus with diabetic peripheral angiopathy without gangrene: Secondary | ICD-10-CM | POA: Diagnosis not present

## 2018-03-12 DIAGNOSIS — L89322 Pressure ulcer of left buttock, stage 2: Secondary | ICD-10-CM | POA: Diagnosis not present

## 2018-03-12 DIAGNOSIS — L89312 Pressure ulcer of right buttock, stage 2: Secondary | ICD-10-CM | POA: Diagnosis not present

## 2018-03-12 DIAGNOSIS — L89132 Pressure ulcer of right lower back, stage 2: Secondary | ICD-10-CM | POA: Diagnosis not present

## 2018-03-12 DIAGNOSIS — Z4800 Encounter for change or removal of nonsurgical wound dressing: Secondary | ICD-10-CM | POA: Diagnosis not present

## 2018-03-12 DIAGNOSIS — Z89512 Acquired absence of left leg below knee: Secondary | ICD-10-CM | POA: Diagnosis not present

## 2018-03-12 DIAGNOSIS — Z89511 Acquired absence of right leg below knee: Secondary | ICD-10-CM | POA: Diagnosis not present

## 2018-03-12 DIAGNOSIS — Z7901 Long term (current) use of anticoagulants: Secondary | ICD-10-CM | POA: Diagnosis not present

## 2018-03-12 DIAGNOSIS — E1140 Type 2 diabetes mellitus with diabetic neuropathy, unspecified: Secondary | ICD-10-CM | POA: Diagnosis not present

## 2018-03-12 DIAGNOSIS — I70203 Unspecified atherosclerosis of native arteries of extremities, bilateral legs: Secondary | ICD-10-CM | POA: Diagnosis not present

## 2018-03-12 DIAGNOSIS — M1990 Unspecified osteoarthritis, unspecified site: Secondary | ICD-10-CM | POA: Diagnosis not present

## 2018-03-12 DIAGNOSIS — Z993 Dependence on wheelchair: Secondary | ICD-10-CM | POA: Diagnosis not present

## 2018-03-12 DIAGNOSIS — Z87891 Personal history of nicotine dependence: Secondary | ICD-10-CM | POA: Diagnosis not present

## 2018-03-12 DIAGNOSIS — Z7902 Long term (current) use of antithrombotics/antiplatelets: Secondary | ICD-10-CM | POA: Diagnosis not present

## 2018-03-12 DIAGNOSIS — D631 Anemia in chronic kidney disease: Secondary | ICD-10-CM | POA: Diagnosis not present

## 2018-03-12 DIAGNOSIS — E1122 Type 2 diabetes mellitus with diabetic chronic kidney disease: Secondary | ICD-10-CM | POA: Diagnosis not present

## 2018-03-14 DIAGNOSIS — L89302 Pressure ulcer of unspecified buttock, stage 2: Secondary | ICD-10-CM | POA: Diagnosis not present

## 2018-03-14 DIAGNOSIS — E1122 Type 2 diabetes mellitus with diabetic chronic kidney disease: Secondary | ICD-10-CM | POA: Diagnosis not present

## 2018-03-14 DIAGNOSIS — I5042 Chronic combined systolic (congestive) and diastolic (congestive) heart failure: Secondary | ICD-10-CM | POA: Diagnosis not present

## 2018-03-14 DIAGNOSIS — N2581 Secondary hyperparathyroidism of renal origin: Secondary | ICD-10-CM | POA: Diagnosis not present

## 2018-03-14 DIAGNOSIS — D649 Anemia, unspecified: Secondary | ICD-10-CM | POA: Diagnosis not present

## 2018-03-14 DIAGNOSIS — N186 End stage renal disease: Secondary | ICD-10-CM | POA: Diagnosis not present

## 2018-03-14 DIAGNOSIS — Z992 Dependence on renal dialysis: Secondary | ICD-10-CM | POA: Diagnosis not present

## 2018-03-14 DIAGNOSIS — D72829 Elevated white blood cell count, unspecified: Secondary | ICD-10-CM | POA: Diagnosis not present

## 2018-03-14 DIAGNOSIS — D631 Anemia in chronic kidney disease: Secondary | ICD-10-CM | POA: Diagnosis not present

## 2018-03-14 DIAGNOSIS — F339 Major depressive disorder, recurrent, unspecified: Secondary | ICD-10-CM | POA: Diagnosis not present

## 2018-03-14 DIAGNOSIS — E46 Unspecified protein-calorie malnutrition: Secondary | ICD-10-CM | POA: Diagnosis not present

## 2018-03-14 DIAGNOSIS — Z89512 Acquired absence of left leg below knee: Secondary | ICD-10-CM | POA: Diagnosis not present

## 2018-03-14 DIAGNOSIS — G4700 Insomnia, unspecified: Secondary | ICD-10-CM | POA: Diagnosis not present

## 2018-03-15 DIAGNOSIS — L89322 Pressure ulcer of left buttock, stage 2: Secondary | ICD-10-CM | POA: Diagnosis not present

## 2018-03-15 DIAGNOSIS — Z89512 Acquired absence of left leg below knee: Secondary | ICD-10-CM | POA: Diagnosis not present

## 2018-03-15 DIAGNOSIS — I70203 Unspecified atherosclerosis of native arteries of extremities, bilateral legs: Secondary | ICD-10-CM | POA: Diagnosis not present

## 2018-03-15 DIAGNOSIS — L89312 Pressure ulcer of right buttock, stage 2: Secondary | ICD-10-CM | POA: Diagnosis not present

## 2018-03-15 DIAGNOSIS — Z4781 Encounter for orthopedic aftercare following surgical amputation: Secondary | ICD-10-CM | POA: Diagnosis not present

## 2018-03-15 DIAGNOSIS — E1151 Type 2 diabetes mellitus with diabetic peripheral angiopathy without gangrene: Secondary | ICD-10-CM | POA: Diagnosis not present

## 2018-03-16 DIAGNOSIS — N2581 Secondary hyperparathyroidism of renal origin: Secondary | ICD-10-CM | POA: Diagnosis not present

## 2018-03-16 DIAGNOSIS — D631 Anemia in chronic kidney disease: Secondary | ICD-10-CM | POA: Diagnosis not present

## 2018-03-16 DIAGNOSIS — N186 End stage renal disease: Secondary | ICD-10-CM | POA: Diagnosis not present

## 2018-03-17 DIAGNOSIS — N939 Abnormal uterine and vaginal bleeding, unspecified: Secondary | ICD-10-CM | POA: Diagnosis not present

## 2018-03-17 DIAGNOSIS — D72829 Elevated white blood cell count, unspecified: Secondary | ICD-10-CM | POA: Diagnosis not present

## 2018-03-18 DIAGNOSIS — L89322 Pressure ulcer of left buttock, stage 2: Secondary | ICD-10-CM | POA: Diagnosis not present

## 2018-03-18 DIAGNOSIS — Z89512 Acquired absence of left leg below knee: Secondary | ICD-10-CM | POA: Diagnosis not present

## 2018-03-18 DIAGNOSIS — L89312 Pressure ulcer of right buttock, stage 2: Secondary | ICD-10-CM | POA: Diagnosis not present

## 2018-03-18 DIAGNOSIS — N2581 Secondary hyperparathyroidism of renal origin: Secondary | ICD-10-CM | POA: Diagnosis not present

## 2018-03-18 DIAGNOSIS — I70203 Unspecified atherosclerosis of native arteries of extremities, bilateral legs: Secondary | ICD-10-CM | POA: Diagnosis not present

## 2018-03-18 DIAGNOSIS — Z4781 Encounter for orthopedic aftercare following surgical amputation: Secondary | ICD-10-CM | POA: Diagnosis not present

## 2018-03-18 DIAGNOSIS — E1151 Type 2 diabetes mellitus with diabetic peripheral angiopathy without gangrene: Secondary | ICD-10-CM | POA: Diagnosis not present

## 2018-03-18 DIAGNOSIS — N186 End stage renal disease: Secondary | ICD-10-CM | POA: Diagnosis not present

## 2018-03-18 DIAGNOSIS — D631 Anemia in chronic kidney disease: Secondary | ICD-10-CM | POA: Diagnosis not present

## 2018-03-20 DIAGNOSIS — E1151 Type 2 diabetes mellitus with diabetic peripheral angiopathy without gangrene: Secondary | ICD-10-CM | POA: Diagnosis not present

## 2018-03-20 DIAGNOSIS — Z4781 Encounter for orthopedic aftercare following surgical amputation: Secondary | ICD-10-CM | POA: Diagnosis not present

## 2018-03-20 DIAGNOSIS — I70203 Unspecified atherosclerosis of native arteries of extremities, bilateral legs: Secondary | ICD-10-CM | POA: Diagnosis not present

## 2018-03-20 DIAGNOSIS — L89322 Pressure ulcer of left buttock, stage 2: Secondary | ICD-10-CM | POA: Diagnosis not present

## 2018-03-20 DIAGNOSIS — Z89512 Acquired absence of left leg below knee: Secondary | ICD-10-CM | POA: Diagnosis not present

## 2018-03-20 DIAGNOSIS — L89312 Pressure ulcer of right buttock, stage 2: Secondary | ICD-10-CM | POA: Diagnosis not present

## 2018-03-21 DIAGNOSIS — N2581 Secondary hyperparathyroidism of renal origin: Secondary | ICD-10-CM | POA: Diagnosis not present

## 2018-03-21 DIAGNOSIS — N186 End stage renal disease: Secondary | ICD-10-CM | POA: Diagnosis not present

## 2018-03-21 DIAGNOSIS — D631 Anemia in chronic kidney disease: Secondary | ICD-10-CM | POA: Diagnosis not present

## 2018-03-22 DIAGNOSIS — E1151 Type 2 diabetes mellitus with diabetic peripheral angiopathy without gangrene: Secondary | ICD-10-CM | POA: Diagnosis not present

## 2018-03-22 DIAGNOSIS — I70203 Unspecified atherosclerosis of native arteries of extremities, bilateral legs: Secondary | ICD-10-CM | POA: Diagnosis not present

## 2018-03-22 DIAGNOSIS — Z5181 Encounter for therapeutic drug level monitoring: Secondary | ICD-10-CM | POA: Diagnosis not present

## 2018-03-22 DIAGNOSIS — Z89512 Acquired absence of left leg below knee: Secondary | ICD-10-CM | POA: Diagnosis not present

## 2018-03-22 DIAGNOSIS — Z4781 Encounter for orthopedic aftercare following surgical amputation: Secondary | ICD-10-CM | POA: Diagnosis not present

## 2018-03-22 DIAGNOSIS — L89322 Pressure ulcer of left buttock, stage 2: Secondary | ICD-10-CM | POA: Diagnosis not present

## 2018-03-22 DIAGNOSIS — L89312 Pressure ulcer of right buttock, stage 2: Secondary | ICD-10-CM | POA: Diagnosis not present

## 2018-03-23 DIAGNOSIS — N2581 Secondary hyperparathyroidism of renal origin: Secondary | ICD-10-CM | POA: Diagnosis not present

## 2018-03-23 DIAGNOSIS — D631 Anemia in chronic kidney disease: Secondary | ICD-10-CM | POA: Diagnosis not present

## 2018-03-23 DIAGNOSIS — N186 End stage renal disease: Secondary | ICD-10-CM | POA: Diagnosis not present

## 2018-03-24 ENCOUNTER — Other Ambulatory Visit: Payer: Self-pay

## 2018-03-24 ENCOUNTER — Encounter (HOSPITAL_COMMUNITY): Payer: Self-pay | Admitting: Emergency Medicine

## 2018-03-24 ENCOUNTER — Inpatient Hospital Stay (HOSPITAL_COMMUNITY)
Admission: EM | Admit: 2018-03-24 | Discharge: 2018-04-09 | DRG: 871 | Disposition: E | Payer: Medicare Other | Attending: Internal Medicine | Admitting: Internal Medicine

## 2018-03-24 ENCOUNTER — Emergency Department (HOSPITAL_COMMUNITY): Payer: Medicare Other

## 2018-03-24 DIAGNOSIS — J9600 Acute respiratory failure, unspecified whether with hypoxia or hypercapnia: Secondary | ICD-10-CM

## 2018-03-24 DIAGNOSIS — L89322 Pressure ulcer of left buttock, stage 2: Secondary | ICD-10-CM | POA: Diagnosis not present

## 2018-03-24 DIAGNOSIS — Z6827 Body mass index (BMI) 27.0-27.9, adult: Secondary | ICD-10-CM

## 2018-03-24 DIAGNOSIS — J690 Pneumonitis due to inhalation of food and vomit: Secondary | ICD-10-CM | POA: Diagnosis present

## 2018-03-24 DIAGNOSIS — Z89512 Acquired absence of left leg below knee: Secondary | ICD-10-CM | POA: Diagnosis not present

## 2018-03-24 DIAGNOSIS — K219 Gastro-esophageal reflux disease without esophagitis: Secondary | ICD-10-CM | POA: Diagnosis present

## 2018-03-24 DIAGNOSIS — E1151 Type 2 diabetes mellitus with diabetic peripheral angiopathy without gangrene: Secondary | ICD-10-CM | POA: Diagnosis not present

## 2018-03-24 DIAGNOSIS — D649 Anemia, unspecified: Secondary | ICD-10-CM | POA: Diagnosis not present

## 2018-03-24 DIAGNOSIS — R52 Pain, unspecified: Secondary | ICD-10-CM | POA: Diagnosis not present

## 2018-03-24 DIAGNOSIS — E669 Obesity, unspecified: Secondary | ICD-10-CM | POA: Diagnosis present

## 2018-03-24 DIAGNOSIS — R54 Age-related physical debility: Secondary | ICD-10-CM | POA: Diagnosis present

## 2018-03-24 DIAGNOSIS — J189 Pneumonia, unspecified organism: Secondary | ICD-10-CM

## 2018-03-24 DIAGNOSIS — I4891 Unspecified atrial fibrillation: Secondary | ICD-10-CM | POA: Diagnosis not present

## 2018-03-24 DIAGNOSIS — D631 Anemia in chronic kidney disease: Secondary | ICD-10-CM | POA: Diagnosis present

## 2018-03-24 DIAGNOSIS — I482 Chronic atrial fibrillation, unspecified: Secondary | ICD-10-CM | POA: Diagnosis present

## 2018-03-24 DIAGNOSIS — R0689 Other abnormalities of breathing: Secondary | ICD-10-CM | POA: Diagnosis not present

## 2018-03-24 DIAGNOSIS — I499 Cardiac arrhythmia, unspecified: Secondary | ICD-10-CM | POA: Diagnosis not present

## 2018-03-24 DIAGNOSIS — N186 End stage renal disease: Secondary | ICD-10-CM | POA: Diagnosis present

## 2018-03-24 DIAGNOSIS — E876 Hypokalemia: Secondary | ICD-10-CM | POA: Diagnosis present

## 2018-03-24 DIAGNOSIS — Z4781 Encounter for orthopedic aftercare following surgical amputation: Secondary | ICD-10-CM | POA: Diagnosis not present

## 2018-03-24 DIAGNOSIS — Z992 Dependence on renal dialysis: Secondary | ICD-10-CM

## 2018-03-24 DIAGNOSIS — Z823 Family history of stroke: Secondary | ICD-10-CM

## 2018-03-24 DIAGNOSIS — K529 Noninfective gastroenteritis and colitis, unspecified: Secondary | ICD-10-CM | POA: Diagnosis present

## 2018-03-24 DIAGNOSIS — Z881 Allergy status to other antibiotic agents status: Secondary | ICD-10-CM

## 2018-03-24 DIAGNOSIS — Z66 Do not resuscitate: Secondary | ICD-10-CM | POA: Diagnosis present

## 2018-03-24 DIAGNOSIS — Z833 Family history of diabetes mellitus: Secondary | ICD-10-CM

## 2018-03-24 DIAGNOSIS — Z7902 Long term (current) use of antithrombotics/antiplatelets: Secondary | ICD-10-CM

## 2018-03-24 DIAGNOSIS — R6521 Severe sepsis with septic shock: Secondary | ICD-10-CM | POA: Diagnosis present

## 2018-03-24 DIAGNOSIS — J9621 Acute and chronic respiratory failure with hypoxia: Secondary | ICD-10-CM | POA: Diagnosis not present

## 2018-03-24 DIAGNOSIS — Z743 Need for continuous supervision: Secondary | ICD-10-CM | POA: Diagnosis not present

## 2018-03-24 DIAGNOSIS — I9589 Other hypotension: Secondary | ICD-10-CM | POA: Diagnosis present

## 2018-03-24 DIAGNOSIS — Z90710 Acquired absence of both cervix and uterus: Secondary | ICD-10-CM

## 2018-03-24 DIAGNOSIS — E1122 Type 2 diabetes mellitus with diabetic chronic kidney disease: Secondary | ICD-10-CM | POA: Diagnosis present

## 2018-03-24 DIAGNOSIS — E213 Hyperparathyroidism, unspecified: Secondary | ICD-10-CM | POA: Diagnosis present

## 2018-03-24 DIAGNOSIS — Z87891 Personal history of nicotine dependence: Secondary | ICD-10-CM

## 2018-03-24 DIAGNOSIS — I639 Cerebral infarction, unspecified: Secondary | ICD-10-CM | POA: Diagnosis present

## 2018-03-24 DIAGNOSIS — I70203 Unspecified atherosclerosis of native arteries of extremities, bilateral legs: Secondary | ICD-10-CM | POA: Diagnosis not present

## 2018-03-24 DIAGNOSIS — A419 Sepsis, unspecified organism: Principal | ICD-10-CM | POA: Diagnosis present

## 2018-03-24 DIAGNOSIS — R1310 Dysphagia, unspecified: Secondary | ICD-10-CM | POA: Diagnosis present

## 2018-03-24 DIAGNOSIS — Z8673 Personal history of transient ischemic attack (TIA), and cerebral infarction without residual deficits: Secondary | ICD-10-CM

## 2018-03-24 DIAGNOSIS — L89312 Pressure ulcer of right buttock, stage 2: Secondary | ICD-10-CM | POA: Diagnosis not present

## 2018-03-24 DIAGNOSIS — Z8614 Personal history of Methicillin resistant Staphylococcus aureus infection: Secondary | ICD-10-CM

## 2018-03-24 DIAGNOSIS — Z885 Allergy status to narcotic agent status: Secondary | ICD-10-CM

## 2018-03-24 DIAGNOSIS — R0602 Shortness of breath: Secondary | ICD-10-CM | POA: Diagnosis not present

## 2018-03-24 DIAGNOSIS — Z7901 Long term (current) use of anticoagulants: Secondary | ICD-10-CM

## 2018-03-24 DIAGNOSIS — J9601 Acute respiratory failure with hypoxia: Secondary | ICD-10-CM

## 2018-03-24 DIAGNOSIS — E869 Volume depletion, unspecified: Secondary | ICD-10-CM | POA: Diagnosis present

## 2018-03-24 DIAGNOSIS — I252 Old myocardial infarction: Secondary | ICD-10-CM

## 2018-03-24 DIAGNOSIS — E1140 Type 2 diabetes mellitus with diabetic neuropathy, unspecified: Secondary | ICD-10-CM | POA: Diagnosis present

## 2018-03-24 DIAGNOSIS — Z79899 Other long term (current) drug therapy: Secondary | ICD-10-CM

## 2018-03-24 DIAGNOSIS — Z89511 Acquired absence of right leg below knee: Secondary | ICD-10-CM

## 2018-03-24 DIAGNOSIS — F329 Major depressive disorder, single episode, unspecified: Secondary | ICD-10-CM | POA: Diagnosis present

## 2018-03-24 DIAGNOSIS — F32A Depression, unspecified: Secondary | ICD-10-CM | POA: Diagnosis present

## 2018-03-24 NOTE — ED Notes (Signed)
IV team unable to draw labs.

## 2018-03-24 NOTE — ED Triage Notes (Signed)
Patient is from home, short of breath during the day, increased during the day today.  Crackles throughout with diffuse wheezing.  Bilat BKA, history of CHF.  She is in Afib RVR rate of 160's.  Bedsore on buttock.

## 2018-03-24 NOTE — ED Notes (Signed)
IV team at bedside and will draw labs.

## 2018-03-24 NOTE — ED Provider Notes (Signed)
[facility] EMERGENCY DEPARTMENT Provider Note   CSN: [id number] Arrival date & time: 03/31/2018  2317     History   Chief Complaint Chief Complaint  Patient presents with  . Shortness of Breath   Level 5 Caveat due to acuity of condition HPI [name] is a 84 y.o. female.  The history is provided by the patient and the EMS personnel.  Shortness of Breath  This is a new problem. The problem occurs frequently.The problem has been rapidly worsening. Associated symptoms include cough.  Patient with multiple medical conditions including diabetes, end-stage renal disease, on dialysis, presents with shortness of breath.  Patient comes from home short of breath worsening during the day.  She was noted to have crackles throughout her lung fields.  No other details are known at this time  Past Medical History:  Diagnosis Date  . Anemia   . C. difficile diarrhea   . Depression   . Diabetes mellitus   . Diabetic neuropathy (HCC)   . ESRD (end stage renal disease) on dialysis (HCC)    TTS  . GERD (gastroesophageal reflux disease)   . Gout   . Hyperparathyroidism   . MRSA (methicillin resistant staph aureus) culture positive   . Obesity   . Osteoarthritis   . Peripheral arterial disease (HCC)    Gangrene-Left Great Toe  . S/P BKA (below knee amputation) unilateral, right (HCC)   . Stroke [facility])     Patient Active Problem List   Diagnosis Date Noted  . Colitis 03/06/2018  . ESRD (end stage renal disease) on dialysis (HCC)   . Atrial fibrillation (HCC) 01/17/2018  . Gangrene (HCC)   . Pressure injury of skin 01/09/2018  . TIA (transient ischemic attack) 01/08/2018  . Unilateral complete BKA, right, initial encounter (HCC)   . Diabetes mellitus type 2 in obese (HCC)   . History of CVA (cerebrovascular accident)   . Post-operative pain   . Acute blood loss anemia   . Anemia of chronic disease   . Benign essential HTN   . Morbid obesity (HCC)   . Below  knee amputation status, right 11/11/2017  . PAD (peripheral artery disease) (HCC) 04/29/2015  . Atherosclerosis of native arteries of the extremities with ulceration (HCC) 01/10/2014  . CVA (cerebral infarction) 12/14/2013  . Dysphagia, unspecified(787.20) 11/28/2013  . Diabetes mellitus with peripheral vascular disease (HCC) 10/31/2012  . Depression 10/31/2012  . Anemia 10/31/2012  . History of Clostridium difficile colitis 10/31/2012  . Chronic diarrhea 10/31/2012    Past Surgical History:  Procedure Laterality Date  . ABDOMINAL AORTOGRAM N/A 07/05/2016   Procedure: Abdominal Aortogram;  Surgeon: Maeola Harman, MD;  Location: [facility] INVASIVE CV LAB;  Service: Cardiovascular;  Laterality: N/A;  . ABDOMINAL AORTOGRAM N/A 06/24/2017   Procedure: ABDOMINAL AORTOGRAM;  Surgeon: Sherren Kerns, MD;  Location: [facility] INVASIVE CV LAB;  Service: Cardiovascular;  Laterality: N/A;  . ABDOMINAL AORTOGRAM W/LOWER EXTREMITY N/A 01/23/2018   Procedure: ABDOMINAL AORTOGRAM W/LOWER EXTREMITY - Left Side;  Surgeon: Maeola Harman, MD;  Location: [facility] INVASIVE CV LAB;  Service: Cardiovascular;  Laterality: N/A;  . ABDOMINAL HYSTERECTOMY    . AMPUTATION Right 11/11/2017   Procedure: RIGHT BELOW KNEE AMPUTATION;  Surgeon: Nadara Mustard, MD;  Location: [facility] OR;  Service: Orthopedics;  Laterality: Right;  . AMPUTATION Left 03/01/2018   Procedure: BELOW KNEE LEFT LEG  AMPUTATION;  Surgeon: Maeola Harman, MD;  Location: [facility] OR;  Service: Vascular;  Laterality:  Left;  . AV FISTULA PLACEMENT Left 07/07/2015   Procedure: ATTEMPTED INSERTION OFLEFT  ARTERIOVENOUS (AV) GORE-TEX GRAFT THIGH;  Surgeon: Fransisco HertzBrian L Chen, MD;  Location: [facility] OR;  Service: Vascular;  Laterality: Left;  . BELOW KNEE LEG AMPUTATION Right 11/11/2017  . BREAST EXCISIONAL BIOPSY Right 05/25/2006  . COLONOSCOPY    . FLEXIBLE SIGMOIDOSCOPY N/A 03/08/2018   Procedure: FLEXIBLE SIGMOIDOSCOPY;  Surgeon: Benancio DeedsArmbruster, Steven P, MD;   [facility];  Service: Gastroenterology;  Laterality: N/A;  . LOWER EXTREMITY ANGIOGRAPHY Bilateral 07/05/2016   Procedure: Lower Extremity Angiography;  Surgeon: Maeola HarmanBrandon Christopher Cain, MD;  [facility];  Service: Cardiovascular;  Laterality: Bilateral;  . LOWER EXTREMITY ANGIOGRAPHY Bilateral 06/24/2017   Procedure: Lower Extremity Angiography;  Surgeon: Sherren KernsFields, Charles E, MD;  [facility];  Service: Cardiovascular;  Laterality: Bilateral;  . MULTIPLE TOOTH EXTRACTIONS    . PERIPHERAL VASCULAR BALLOON ANGIOPLASTY Left 01/23/2018   Procedure: PERIPHERAL VASCULAR BALLOON ANGIOPLASTY;  Surgeon: Maeola Harmanain, Brandon Christopher, MD;  [facility];  Service: Cardiovascular;  Laterality: Left;  peroneal  . PERIPHERAL VASCULAR INTERVENTION Right 06/24/2017   Procedure: PERIPHERAL VASCULAR INTERVENTION;  Surgeon: Sherren KernsFields, Charles E, MD;  Location: [facility] INVASIVE CV LAB;  Service: Cardiovascular;  Laterality: Right;  POPLITEALPTA SFA STENT  . SHUNTOGRAM Right 09/06/12   Thigh graft  . SP DECLOT AVGG Right Sep 14, 2012   Right thigh graft     OB History   None      Home Medications    Prior to Admission medications   Medication Sig Start Date End Date Taking? Authorizing Provider  acetaminophen (TYLENOL) 325 MG tablet Take 650 mg by mouth every 6 (six) hours as needed for fever.    [provider]  Amino Acids-Protein Hydrolys (FEEDING SUPPLEMENT, PRO-STAT SUGAR FREE 64,) LIQD Take 30 mLs by mouth daily.     [provider]  amiodarone (PACERONE) 200 MG tablet Take 1 tablet (200 mg total) by mouth 2 (two) times daily. 02/24/18   Medina-Vargas, Monina C, NP  apixaban (ELIQUIS) 2.5 MG TABS tablet Take 1 tablet (2.5 mg total) by mouth 2 (two) times daily. 02/24/18   Medina-Vargas, Monina C, NP  bisacodyl (DULCOLAX) 10 MG suppository Place 10 mg rectally as needed for moderate constipation.    [provider]  cinacalcet  (SENSIPAR) 30 MG tablet Take 1 tablet (30 mg total) by mouth every evening. 02/24/18   Medina-Vargas, Monina C, NP  digoxin (LANOXIN) 0.125 MG tablet Take 0.0625 mg by mouth daily.    [provider]  gabapentin (NEURONTIN) 100 MG capsule Take 1 capsule (100 mg total) by mouth at bedtime. 02/24/18   Medina-Vargas, Monina C, NP  midodrine (PROAMATINE) 10 MG tablet Take 1 tablet (10 mg total) by mouth 2 (two) times daily with a meal. 02/27/18   Medina-Vargas, Monina C, NP  multivitamin (RENA-VIT) TABS tablet Take 1 tablet by mouth at bedtime. 03/10/18   Mikhail, Nita SellsMaryann, DO  Nutritional Supplements (FEEDING SUPPLEMENT, NEPRO CARB STEADY,) LIQD Take 237 mLs by mouth daily.    [provider]  oxyCODONE-acetaminophen (PERCOCET/ROXICET) 5-325 MG tablet Take 1 tablet by mouth every 6 (six) hours as needed. Patient taking differently: Take 1 tablet by mouth every 6 (six) hours as needed for moderate pain.  02/24/18   Medina-Vargas, Monina C, NP  RENVELA 800 MG tablet Take 2-3 tablets (1,600-2,400 mg total) by mouth See admin instructions. Take 2400 mg by mouth 3 times daily with  meals and take 1600 mg by mouth with snacks 02/24/18   Medina-Vargas, Monina C, NP  sennosides-docusate sodium (SENOKOT-S) 8.6-50 MG tablet Take 2 tablets by mouth 2 (two) times daily.    [provider]    Family History Family History  Problem Relation Age of Onset  . Cancer Father        type unknown  . Stroke Mother   . Diabetes Sister   . Cancer Brother        type unknown    Social History Social History   Tobacco Use  . Smoking status: Former Smoker    Types: Cigarettes    Last attempt to quit: 1980    Years since quitting: 39.8  . Smokeless tobacco: Never Used  Substance Use Topics  . Alcohol use: No  . Drug use: No     Allergies   Codeine and Vancomycin   Review of Systems Review of Systems  Unable to perform ROS: Acuity of condition  Respiratory: Positive for cough and  shortness of breath.      Physical Exam Updated Vital Signs BP (!) 157/134   Pulse (!) 154   Temp 97.7 F (36.5 C) (Axillary)   Resp (!) 42   SpO2 95%   Physical Exam  CONSTITUTIONAL: Respiratory distress noted, ill-appearing HEAD: Normocephalic/atraumatic EYES: EOMI/PERRL ENMT: Mucous membranes dry NECK: supple no meningeal signs SPINE/BACK:entire spine nontender CV: Irregular, tachycardic LUNGS: Crackles bilaterally ABDOMEN: soft, nontender GU:no cva tenderness NEURO: Pt is awake/alert/appropriate, moves all extremitiesx4.   EXTREMITIES: Patient with previous bilateral BKA SKIN: Dry, bedsore  To buttock PSYCH: Anxious ED Treatments / Results  Labs (all labs ordered are listed, but only abnormal results are displayed) Labs Reviewed  BASIC METABOLIC PANEL - Abnormal; Notable for the following components:      Result Value   Potassium 3.3 (*)    CO2 20 (*)    Glucose, Bld 171 (*)    Creatinine, Ser 3.40 (*)    Calcium 7.1 (*)    GFR calc non Af Amer 12 (*)    GFR calc Af Amer 13 (*)    All other components within normal limits  CBC WITH DIFFERENTIAL/PLATELET - Abnormal; Notable for the following components:   WBC 16.2 (*)    RBC 3.40 (*)    Hemoglobin 7.5 (*)    HCT 27.1 (*)    MCV 79.7 (*)    MCH 22.1 (*)    MCHC 27.7 (*)    RDW 24.5 (*)    nRBC 0.4 (*)    All other components within normal limits  BRAIN NATRIURETIC PEPTIDE - Abnormal; Notable for the following components:   B Natriuretic Peptide 609.2 (*)    All other components within normal limits  DIGOXIN LEVEL - Abnormal; Notable for the following components:   Digoxin Level <0.2 (*)    All other components within normal limits  I-STAT CHEM 8, ED - Abnormal; Notable for the following components:   Sodium 134 (*)    Potassium 5.3 (*)    BUN 25 (*)    Creatinine, Ser 3.40 (*)    Glucose, Bld 152 (*)    Calcium, Ion 0.74 (*)    TCO2 20 (*)    Hemoglobin 11.6 (*)    HCT 34.0 (*)    All other  components within normal limits  I-STAT CG4 LACTIC ACID, ED - Abnormal; Notable for the following components:   Lactic Acid, Venous 4.69 (*)    All other  components within normal limits  CULTURE, BLOOD (ROUTINE X 2)  CULTURE, BLOOD (ROUTINE X 2)  EXPECTORATED SPUTUM ASSESSMENT W REFEX TO RESP CULTURE  GRAM STAIN  LACTIC ACID, PLASMA  TSH  CBC WITH DIFFERENTIAL/PLATELET  LACTIC ACID, PLASMA  LACTIC ACID, PLASMA  PROCALCITONIN  PROTIME-INR  HIV ANTIBODY (ROUTINE TESTING W REFLEX)  STREP PNEUMONIAE URINARY ANTIGEN  LEGIONELLA PNEUMOPHILA SEROGP 1 UR AG    EKG EKG Interpretation  Date/Time:  Friday 2018-04-16 23:26:13 EST Ventricular Rate:  145 PR Interval:    QRS Duration: 83 QT Interval:  300 QTC Calculation: 466 R Axis:   -101 Text Interpretation:  Atrial fibrillation with rapid V-rate Inferior infarct, old Consider anterior infarct Lateral leads are also involved Confirmed by Zadie Rhine ([id number]) on April 16, 2018 11:32:14 PM   Radiology Dg Chest Port 1 View  Result Date: 2018/04/16 CLINICAL DATA:  Acute onset of shortness of breath and wheezing. Diffuse crackles. EXAM: PORTABLE CHEST 1 VIEW COMPARISON:  Chest radiograph performed 01/08/2018 FINDINGS: The lungs are hypoexpanded. Right basilar airspace opacity raises concern for pneumonia. No pleural effusion or pneumothorax is seen. The cardiomediastinal silhouette is borderline normal in size. A right-sided dual-lumen catheter is noted ending at the right atrium. No acute osseous abnormalities are seen. IMPRESSION: Lungs hypoexpanded. Right basilar airspace opacity raises concern for pneumonia. Electronically Signed   By: Roanna Raider M.D.   On: 04-16-18 23:58    Procedures Procedures  CRITICAL CARE Performed by: Joya Gaskins Total critical care time: 40 minutes Critical care time was exclusive of separately billable procedures and treating other patients. Critical care was necessary to treat or prevent  imminent or life-threatening deterioration. Critical care was time spent personally by me on the following activities: development of treatment plan with patient and/or surrogate as well as nursing, discussions with consultants, evaluation of patient's response to treatment, examination of patient, obtaining history from patient or surrogate, ordering and performing treatments and interventions, ordering and review of laboratory studies, ordering and review of radiographic studies, pulse oximetry and re-evaluation of patient's condition.    Medications Ordered in ED Medications  lactated ringers bolus 1,000 mL (has no administration in time range)    And  lactated ringers bolus 1,000 mL (has no administration in time range)    And  lactated ringers bolus 500 mL (has no administration in time range)  ceFEPIme (MAXIPIME) 2 g in sodium chloride 0.9 % 100 mL IVPB (has no administration in time range)     Initial Impression / Assessment and Plan / ED Course  I have reviewed the triage vital signs and the nursing notes.  Pertinent labs & imaging results that were available during my care of the patient were reviewed by me and considered in my medical decision making (see chart for details).     11:40 PM Patient presents for respiratory distress.  I have ordered BiPAP.  She will likely require dialysis.  Will follow closely She has had extensive hospitalizations, most recent discharge, she was noted to be DNR 12:29 AM Work of breathing is improved.  X-ray reveals pneumonia.  Code sepsis called, lactate greater than 4.  IV antibiotics and fluids ordered. Patient is more alert at this time.  She will need to be admitted 2:41 AM Patient continues to remain stable on BiPAP.  We will continue noninvasive ventilation for now.  She is still tachycardic, but the rapid ventricular rate is improving.  At this point I will not start Cardizem due to low  blood pressure.  Blood pressures been ranging from  the 80s to 90s.  Code sepsis has been called and she has been getting fluids/antibiotic She is also noted to have worsening anemia.  On my assessment with female chaperone present, no obvious signs of vaginal bleeding, and stool color was brown, no melena or blood.  Anemia can be monitored in the hospital.  Discussed with Dr. Clyde Lundborg for admission  Sepsis - Repeat Assessment  Performed at:    02:40  Vitals     Blood pressure (!) 75/39, pulse (!) 46, temperature 97.7 F (36.5 C), temperature source Axillary, resp. rate (!) 29, SpO2 98 %.  Heart:     Tachycardic  Lungs:    no distress  Capillary Refill:   <2 sec  Skin:     Dry     Final Clinical Impressions(s) / ED Diagnoses   Final diagnoses:  HCAP (healthcare-associated pneumonia)  Acute respiratory failure, unspecified whether with hypoxia or hypercapnia (HCC)  Acute anemia  Atrial fibrillation with [facility])    ED Discharge Orders    None       Zadie Rhine, MD 03/25/18 [phone]

## 2018-03-25 DIAGNOSIS — E1140 Type 2 diabetes mellitus with diabetic neuropathy, unspecified: Secondary | ICD-10-CM | POA: Diagnosis present

## 2018-03-25 DIAGNOSIS — E869 Volume depletion, unspecified: Secondary | ICD-10-CM | POA: Diagnosis present

## 2018-03-25 DIAGNOSIS — R54 Age-related physical debility: Secondary | ICD-10-CM | POA: Diagnosis present

## 2018-03-25 DIAGNOSIS — D631 Anemia in chronic kidney disease: Secondary | ICD-10-CM | POA: Diagnosis present

## 2018-03-25 DIAGNOSIS — N186 End stage renal disease: Secondary | ICD-10-CM | POA: Diagnosis present

## 2018-03-25 DIAGNOSIS — J9621 Acute and chronic respiratory failure with hypoxia: Secondary | ICD-10-CM | POA: Diagnosis present

## 2018-03-25 DIAGNOSIS — K219 Gastro-esophageal reflux disease without esophagitis: Secondary | ICD-10-CM | POA: Diagnosis present

## 2018-03-25 DIAGNOSIS — J690 Pneumonitis due to inhalation of food and vomit: Secondary | ICD-10-CM | POA: Diagnosis present

## 2018-03-25 DIAGNOSIS — R0602 Shortness of breath: Secondary | ICD-10-CM | POA: Diagnosis not present

## 2018-03-25 DIAGNOSIS — R6520 Severe sepsis without septic shock: Secondary | ICD-10-CM

## 2018-03-25 DIAGNOSIS — Z90710 Acquired absence of both cervix and uterus: Secondary | ICD-10-CM | POA: Diagnosis not present

## 2018-03-25 DIAGNOSIS — I4891 Unspecified atrial fibrillation: Secondary | ICD-10-CM

## 2018-03-25 DIAGNOSIS — I482 Chronic atrial fibrillation, unspecified: Secondary | ICD-10-CM | POA: Diagnosis present

## 2018-03-25 DIAGNOSIS — E669 Obesity, unspecified: Secondary | ICD-10-CM | POA: Diagnosis present

## 2018-03-25 DIAGNOSIS — F329 Major depressive disorder, single episode, unspecified: Secondary | ICD-10-CM | POA: Diagnosis present

## 2018-03-25 DIAGNOSIS — I639 Cerebral infarction, unspecified: Secondary | ICD-10-CM | POA: Diagnosis not present

## 2018-03-25 DIAGNOSIS — I9589 Other hypotension: Secondary | ICD-10-CM | POA: Diagnosis present

## 2018-03-25 DIAGNOSIS — A419 Sepsis, unspecified organism: Secondary | ICD-10-CM | POA: Diagnosis not present

## 2018-03-25 DIAGNOSIS — E876 Hypokalemia: Secondary | ICD-10-CM | POA: Diagnosis present

## 2018-03-25 DIAGNOSIS — N2581 Secondary hyperparathyroidism of renal origin: Secondary | ICD-10-CM | POA: Diagnosis not present

## 2018-03-25 DIAGNOSIS — E1122 Type 2 diabetes mellitus with diabetic chronic kidney disease: Secondary | ICD-10-CM | POA: Diagnosis not present

## 2018-03-25 DIAGNOSIS — E1151 Type 2 diabetes mellitus with diabetic peripheral angiopathy without gangrene: Secondary | ICD-10-CM | POA: Diagnosis present

## 2018-03-25 DIAGNOSIS — E213 Hyperparathyroidism, unspecified: Secondary | ICD-10-CM | POA: Diagnosis present

## 2018-03-25 DIAGNOSIS — R6521 Severe sepsis with septic shock: Secondary | ICD-10-CM | POA: Diagnosis present

## 2018-03-25 DIAGNOSIS — Z992 Dependence on renal dialysis: Secondary | ICD-10-CM | POA: Diagnosis not present

## 2018-03-25 DIAGNOSIS — Z6827 Body mass index (BMI) 27.0-27.9, adult: Secondary | ICD-10-CM | POA: Diagnosis not present

## 2018-03-25 DIAGNOSIS — K529 Noninfective gastroenteritis and colitis, unspecified: Secondary | ICD-10-CM | POA: Diagnosis present

## 2018-03-25 DIAGNOSIS — J9601 Acute respiratory failure with hypoxia: Secondary | ICD-10-CM | POA: Diagnosis not present

## 2018-03-25 DIAGNOSIS — I252 Old myocardial infarction: Secondary | ICD-10-CM | POA: Diagnosis not present

## 2018-03-25 DIAGNOSIS — Z8673 Personal history of transient ischemic attack (TIA), and cerebral infarction without residual deficits: Secondary | ICD-10-CM | POA: Diagnosis not present

## 2018-03-25 DIAGNOSIS — J189 Pneumonia, unspecified organism: Secondary | ICD-10-CM | POA: Diagnosis not present

## 2018-03-25 DIAGNOSIS — R1310 Dysphagia, unspecified: Secondary | ICD-10-CM | POA: Diagnosis present

## 2018-03-25 DIAGNOSIS — I120 Hypertensive chronic kidney disease with stage 5 chronic kidney disease or end stage renal disease: Secondary | ICD-10-CM | POA: Diagnosis not present

## 2018-03-25 LAB — I-STAT CHEM 8, ED
BUN: 25 mg/dL — ABNORMAL HIGH (ref 8–23)
Calcium, Ion: 0.74 mmol/L — CL (ref 1.15–1.40)
Chloride: 107 mmol/L (ref 98–111)
Creatinine, Ser: 3.4 mg/dL — ABNORMAL HIGH (ref 0.44–1.00)
Glucose, Bld: 152 mg/dL — ABNORMAL HIGH (ref 70–99)
HCT: 34 % — ABNORMAL LOW (ref 36.0–46.0)
Hemoglobin: 11.6 g/dL — ABNORMAL LOW (ref 12.0–15.0)
Potassium: 5.3 mmol/L — ABNORMAL HIGH (ref 3.5–5.1)
Sodium: 134 mmol/L — ABNORMAL LOW (ref 135–145)
TCO2: 20 mmol/L — ABNORMAL LOW (ref 22–32)

## 2018-03-25 LAB — TSH: TSH: 6.387 u[IU]/mL — ABNORMAL HIGH (ref 0.350–4.500)

## 2018-03-25 LAB — GLUCOSE, CAPILLARY
GLUCOSE-CAPILLARY: 131 mg/dL — AB (ref 70–99)
GLUCOSE-CAPILLARY: 136 mg/dL — AB (ref 70–99)
GLUCOSE-CAPILLARY: 84 mg/dL (ref 70–99)
Glucose-Capillary: 47 mg/dL — ABNORMAL LOW (ref 70–99)
Glucose-Capillary: 70 mg/dL (ref 70–99)

## 2018-03-25 LAB — CBC WITH DIFFERENTIAL/PLATELET
Abs Immature Granulocytes: 0.26 10*3/uL — ABNORMAL HIGH (ref 0.00–0.07)
BAND NEUTROPHILS: 0 %
BASOS ABS: 0.2 10*3/uL — AB (ref 0.0–0.1)
BASOS PCT: 0 %
Basophils Absolute: 0 10*3/uL (ref 0.0–0.1)
Basophils Relative: 1 %
Blasts: 0 %
EOS ABS: 0 10*3/uL (ref 0.0–0.5)
EOS ABS: 0 10*3/uL (ref 0.0–0.5)
Eosinophils Relative: 0 %
Eosinophils Relative: 0 %
HCT: 27.1 % — ABNORMAL LOW (ref 36.0–46.0)
HEMATOCRIT: 24.3 % — AB (ref 36.0–46.0)
Hemoglobin: 6.9 g/dL — CL (ref 12.0–15.0)
Hemoglobin: 7.5 g/dL — ABNORMAL LOW (ref 12.0–15.0)
Immature Granulocytes: 2 %
Lymphocytes Relative: 8 %
Lymphocytes Relative: 6 %
Lymphs Abs: 1.1 10*3/uL (ref 0.7–4.0)
Lymphs Abs: 1 10*3/uL (ref 0.7–4.0)
MCH: 22.3 pg — ABNORMAL LOW (ref 26.0–34.0)
MCH: 22.1 pg — ABNORMAL LOW (ref 26.0–34.0)
MCHC: 28.4 g/dL — ABNORMAL LOW (ref 30.0–36.0)
MCHC: 27.7 g/dL — ABNORMAL LOW (ref 30.0–36.0)
MCV: 78.6 fL — ABNORMAL LOW (ref 80.0–100.0)
MCV: 79.7 fL — ABNORMAL LOW (ref 80.0–100.0)
METAMYELOCYTES PCT: 0 %
MONOS PCT: 1 %
MONOS PCT: 5 %
MYELOCYTES: 0 %
Monocytes Absolute: 0.7 10*3/uL (ref 0.1–1.0)
Monocytes Absolute: 0.2 10*3/uL (ref 0.1–1.0)
NEUTROS ABS: 14.8 10*3/uL — AB (ref 1.7–7.7)
NRBC: 0.3 % — AB (ref 0.0–0.2)
Neutro Abs: 11.6 10*3/uL — ABNORMAL HIGH (ref 1.7–7.7)
Neutrophils Relative %: 85 %
Neutrophils Relative %: 92 %
Other: 0 %
PLATELETS: 285 10*3/uL (ref 150–400)
PROMYELOCYTES RELATIVE: 0 %
Platelets: 260 10*3/uL (ref 150–400)
RBC: 3.09 MIL/uL — AB (ref 3.87–5.11)
RBC: 3.4 MIL/uL — AB (ref 3.87–5.11)
RDW: 23 % — ABNORMAL HIGH (ref 11.5–15.5)
RDW: 24.5 % — AB (ref 11.5–15.5)
WBC: 13.7 10*3/uL — AB (ref 4.0–10.5)
WBC: 16.2 10*3/uL — ABNORMAL HIGH (ref 4.0–10.5)
nRBC: 0 /100 WBC
nRBC: 0.4 % — ABNORMAL HIGH (ref 0.0–0.2)

## 2018-03-25 LAB — LACTIC ACID, PLASMA: Lactic Acid, Venous: 1.9 mmol/L (ref 0.5–1.9)

## 2018-03-25 LAB — BASIC METABOLIC PANEL
ANION GAP: 14 (ref 5–15)
BUN: 17 mg/dL (ref 8–23)
CHLORIDE: 103 mmol/L (ref 98–111)
CO2: 20 mmol/L — ABNORMAL LOW (ref 22–32)
Calcium: 7.1 mg/dL — ABNORMAL LOW (ref 8.9–10.3)
Creatinine, Ser: 3.4 mg/dL — ABNORMAL HIGH (ref 0.44–1.00)
GFR calc Af Amer: 13 mL/min — ABNORMAL LOW (ref >60)
GFR, EST NON AFRICAN AMERICAN: 12 mL/min — AB (ref >60)
GLUCOSE: 171 mg/dL — AB (ref 70–99)
POTASSIUM: 3.3 mmol/L — AB (ref 3.5–5.1)
Sodium: 137 mmol/L (ref 135–145)

## 2018-03-25 LAB — CBC
HCT: 30.1 % — ABNORMAL LOW (ref 36.0–46.0)
Hemoglobin: 8.4 g/dL — ABNORMAL LOW (ref 12.0–15.0)
MCH: 22.4 pg — AB (ref 26.0–34.0)
MCHC: 27.9 g/dL — ABNORMAL LOW (ref 30.0–36.0)
MCV: 80.3 fL (ref 80.0–100.0)
PLATELETS: 253 10*3/uL (ref 150–400)
RBC: 3.75 MIL/uL — AB (ref 3.87–5.11)
RDW: 22.7 % — ABNORMAL HIGH (ref 11.5–15.5)
WBC: 22.8 10*3/uL — AB (ref 4.0–10.5)
nRBC: 0.4 % — ABNORMAL HIGH (ref 0.0–0.2)

## 2018-03-25 LAB — C DIFFICILE QUICK SCREEN W PCR REFLEX
C DIFFICILE (CDIFF) INTERP: NOT DETECTED
C DIFFICILE (CDIFF) TOXIN: NEGATIVE
C Diff antigen: NEGATIVE

## 2018-03-25 LAB — I-STAT CG4 LACTIC ACID, ED: Lactic Acid, Venous: 4.69 mmol/L (ref 0.5–1.9)

## 2018-03-25 LAB — PREPARE RBC (CROSSMATCH)

## 2018-03-25 LAB — MRSA PCR SCREENING: MRSA by PCR: NEGATIVE

## 2018-03-25 LAB — PROTIME-INR
INR: 1.24
Prothrombin Time: 15.5 seconds — ABNORMAL HIGH (ref 11.4–15.2)

## 2018-03-25 LAB — PROCALCITONIN: PROCALCITONIN: 6.8 ng/mL

## 2018-03-25 LAB — BRAIN NATRIURETIC PEPTIDE: B NATRIURETIC PEPTIDE 5: 609.2 pg/mL — AB (ref 0.0–100.0)

## 2018-03-25 LAB — HIV ANTIBODY (ROUTINE TESTING W REFLEX): HIV SCREEN 4TH GENERATION: NONREACTIVE

## 2018-03-25 LAB — DIGOXIN LEVEL: Digoxin Level: <0.2 ng/mL — ABNORMAL LOW (ref 0.8–2.0)

## 2018-03-25 LAB — CBG MONITORING, ED: Glucose-Capillary: 108 mg/dL — ABNORMAL HIGH (ref 70–99)

## 2018-03-25 MED ORDER — LACTATED RINGERS IV BOLUS (SEPSIS): 1000.0000 mL | Freq: Once | INTRAVENOUS | Status: DC

## 2018-03-25 MED ORDER — LACTATED RINGERS IV BOLUS (SEPSIS)
1000.0000 mL | Freq: Once | INTRAVENOUS | Status: AC
  Administered 2018-03-25: 1000 mL via INTRAVENOUS

## 2018-03-25 MED ORDER — ONDANSETRON HCL 4 MG/2ML IJ SOLN: 4.0000 mg | Freq: Three times a day (TID) | INTRAMUSCULAR | Status: DC | PRN

## 2018-03-25 MED ORDER — AMIODARONE HCL 200 MG PO TABS
200.0000 mg | ORAL_TABLET | Freq: Two times a day (BID) | ORAL | Status: DC
  Administered 2018-03-26: 200 mg via ORAL
  Filled 2018-03-25: qty 1

## 2018-03-25 MED ORDER — PIPERACILLIN-TAZOBACTAM 3.375 G IVPB
3.3750 g | Freq: Two times a day (BID) | INTRAVENOUS | Status: DC
  Administered 2018-03-25 – 2018-03-26 (×2): 3.375 g via INTRAVENOUS
  Filled 2018-03-25 (×3): qty 50

## 2018-03-25 MED ORDER — SODIUM CHLORIDE 0.9 % IV SOLN: INTRAVENOUS | Status: DC

## 2018-03-25 MED ORDER — LACTATED RINGERS IV BOLUS (SEPSIS)
500.0000 mL | Freq: Once | INTRAVENOUS | Status: AC
  Administered 2018-03-25: 500 mL via INTRAVENOUS

## 2018-03-25 MED ORDER — SODIUM CHLORIDE 0.9 % IV SOLN
2.0000 g | Freq: Once | INTRAVENOUS | Status: AC
  Administered 2018-03-25: 2 g via INTRAVENOUS
  Filled 2018-03-25: qty 2

## 2018-03-25 MED ORDER — GUAIFENESIN ER 600 MG PO TB12
600.0000 mg | ORAL_TABLET | Freq: Two times a day (BID) | ORAL | Status: DC
  Administered 2018-03-25 – 2018-03-26 (×3): 600 mg via ORAL
  Filled 2018-03-25 (×3): qty 1

## 2018-03-25 MED ORDER — AMIODARONE HCL 200 MG PO TABS: 200.0000 mg | ORAL_TABLET | Freq: Every day | ORAL | Status: DC

## 2018-03-25 MED ORDER — SODIUM CHLORIDE 0.9 % IV BOLUS
500.0000 mL | Freq: Once | INTRAVENOUS | Status: AC
  Administered 2018-03-25: 500 mL via INTRAVENOUS

## 2018-03-25 MED ORDER — AMIODARONE HCL 200 MG PO TABS
200.0000 mg | ORAL_TABLET | Freq: Two times a day (BID) | ORAL | Status: DC
  Administered 2018-03-25: 200 mg via ORAL
  Filled 2018-03-25: qty 1

## 2018-03-25 MED ORDER — SEVELAMER CARBONATE 800 MG PO TABS
2400.0000 mg | ORAL_TABLET | Freq: Three times a day (TID) | ORAL | Status: DC
  Administered 2018-03-25 – 2018-03-26 (×3): 2400 mg via ORAL
  Filled 2018-03-25 (×3): qty 3

## 2018-03-25 MED ORDER — HEPARIN SODIUM (PORCINE) 1000 UNIT/ML IJ SOLN
INTRAMUSCULAR | Status: AC
  Administered 2018-03-25: 6000 [IU] via INTRAVENOUS_CENTRAL
  Filled 2018-03-25: qty 10

## 2018-03-25 MED ORDER — PHENYLEPHRINE HCL-NACL 10-0.9 MG/250ML-% IV SOLN
0.0000 ug/min | INTRAVENOUS | Status: DC
  Administered 2018-03-25: 20 ug/min via INTRAVENOUS
  Administered 2018-03-26: 110 ug/min via INTRAVENOUS
  Filled 2018-03-25 (×2): qty 250

## 2018-03-25 MED ORDER — CLOPIDOGREL BISULFATE 75 MG PO TABS
75.0000 mg | ORAL_TABLET | Freq: Every day | ORAL | Status: DC
  Administered 2018-03-25 – 2018-03-26 (×2): 75 mg via ORAL
  Filled 2018-03-25 (×2): qty 1

## 2018-03-25 MED ORDER — IPRATROPIUM-ALBUTEROL 0.5-2.5 (3) MG/3ML IN SOLN: 3.0000 mL | Freq: Four times a day (QID) | RESPIRATORY_TRACT | Status: DC

## 2018-03-25 MED ORDER — AMIODARONE HCL IN DEXTROSE 360-4.14 MG/200ML-% IV SOLN
30.0000 mg/h | INTRAVENOUS | Status: DC
  Administered 2018-03-25 – 2018-03-26 (×2): 30 mg/h via INTRAVENOUS
  Filled 2018-03-25: qty 200

## 2018-03-25 MED ORDER — MIRTAZAPINE 15 MG PO TABS: 15.0000 mg | ORAL_TABLET | Freq: Every day | ORAL | Status: DC

## 2018-03-25 MED ORDER — ZOLPIDEM TARTRATE 5 MG PO TABS: 5.0000 mg | ORAL_TABLET | Freq: Every evening | ORAL | Status: DC | PRN

## 2018-03-25 MED ORDER — SODIUM CHLORIDE 0.9 % IV SOLN
INTRAVENOUS | Status: DC
  Administered 2018-03-25: 18:00:00 via INTRAVENOUS

## 2018-03-25 MED ORDER — SODIUM CHLORIDE 0.9% IV SOLUTION: Freq: Once | INTRAVENOUS | Status: DC

## 2018-03-25 MED ORDER — POTASSIUM CHLORIDE 20 MEQ/15ML (10%) PO SOLN: 20.0000 meq | Freq: Once | ORAL | Status: DC

## 2018-03-25 MED ORDER — HEPARIN SODIUM (PORCINE) 1000 UNIT/ML DIALYSIS
6000.0000 [IU] | Freq: Once | INTRAMUSCULAR | Status: AC
  Administered 2018-03-25: 6000 [IU] via INTRAVENOUS_CENTRAL
  Filled 2018-03-25: qty 6

## 2018-03-25 MED ORDER — AMIODARONE LOAD VIA INFUSION
150.0000 mg | Freq: Once | INTRAVENOUS | Status: AC
  Administered 2018-03-25: 150 mg via INTRAVENOUS
  Filled 2018-03-25: qty 83.34

## 2018-03-25 MED ORDER — SEVELAMER CARBONATE 800 MG PO TABS: 1600.0000 mg | ORAL_TABLET | ORAL | Status: DC | PRN

## 2018-03-25 MED ORDER — MIRTAZAPINE 7.5 MG PO TABS
7.5000 mg | ORAL_TABLET | Freq: Every day | ORAL | Status: DC
  Administered 2018-03-25: 7.5 mg via ORAL
  Filled 2018-03-25: qty 1

## 2018-03-25 MED ORDER — APIXABAN 2.5 MG PO TABS
2.5000 mg | ORAL_TABLET | Freq: Two times a day (BID) | ORAL | Status: DC
  Administered 2018-03-25 – 2018-03-26 (×3): 2.5 mg via ORAL
  Filled 2018-03-25 (×3): qty 1

## 2018-03-25 MED ORDER — CHLORHEXIDINE GLUCONATE CLOTH 2 % EX PADS
6.0000 | MEDICATED_PAD | Freq: Every day | CUTANEOUS | Status: DC
  Administered 2018-03-25 (×2): 6 via TOPICAL

## 2018-03-25 MED ORDER — IPRATROPIUM BROMIDE 0.02 % IN SOLN: 0.5000 mg | RESPIRATORY_TRACT | Status: DC

## 2018-03-25 MED ORDER — LEVOFLOXACIN IN D5W 500 MG/100ML IV SOLN: 500.0000 mg | INTRAVENOUS | Status: DC

## 2018-03-25 MED ORDER — AMIODARONE HCL IN DEXTROSE 360-4.14 MG/200ML-% IV SOLN
60.0000 mg/h | INTRAVENOUS | Status: AC
  Administered 2018-03-25 (×2): 60 mg/h via INTRAVENOUS
  Filled 2018-03-25: qty 400

## 2018-03-25 MED ORDER — ACETAMINOPHEN 325 MG PO TABS: 650.0000 mg | ORAL_TABLET | Freq: Four times a day (QID) | ORAL | Status: DC | PRN

## 2018-03-25 MED ORDER — DEXTROSE 50 % IV SOLN
INTRAVENOUS | Status: AC
  Administered 2018-03-25: 11:00:00
  Filled 2018-03-25: qty 50

## 2018-03-25 MED ORDER — PANTOPRAZOLE SODIUM 40 MG PO TBEC
40.0000 mg | DELAYED_RELEASE_TABLET | Freq: Every day | ORAL | Status: DC
  Administered 2018-03-25 – 2018-03-26 (×2): 40 mg via ORAL
  Filled 2018-03-25 (×2): qty 1

## 2018-03-25 MED ORDER — LEVALBUTEROL HCL 1.25 MG/0.5ML IN NEBU: 1.2500 mg | INHALATION_SOLUTION | Freq: Four times a day (QID) | RESPIRATORY_TRACT | Status: DC

## 2018-03-25 MED ORDER — MIDODRINE HCL 5 MG PO TABS
10.0000 mg | ORAL_TABLET | Freq: Three times a day (TID) | ORAL | Status: DC
  Administered 2018-03-25 – 2018-03-26 (×5): 10 mg via ORAL
  Filled 2018-03-25 (×7): qty 2

## 2018-03-25 MED ORDER — GABAPENTIN 100 MG PO CAPS
100.0000 mg | ORAL_CAPSULE | Freq: Every day | ORAL | Status: DC
  Administered 2018-03-25: 100 mg via ORAL
  Filled 2018-03-25: qty 1

## 2018-03-25 MED ORDER — LEVOFLOXACIN IN D5W 750 MG/150ML IV SOLN: 750.0000 mg | Freq: Once | INTRAVENOUS | Status: DC

## 2018-03-25 MED ORDER — PIPERACILLIN-TAZOBACTAM 3.375 G IVPB 30 MIN
3.3750 g | Freq: Once | INTRAVENOUS | Status: DC
  Filled 2018-03-25: qty 50

## 2018-03-25 NOTE — Consult Note (Addendum)
NAME:  [name], MRN:  [id number], DOB:  10-29-1932, LOS: 0 ADMISSION DATE:  03/23/2018, CONSULTATION DATE:  03/25/18 REFERRING MD:  Dr. Maryfrances Bunnell, CHIEF COMPLAINT:  Hypotension    Brief History   84 y/o F admitted with reports of SOB.  Found to be hypotensive & in [facility] on admit.  Concern for possible RLL PNA.  Treated as sepsis, mild anemia.  Remained in [facility].  PCCM consulted.   History of present illness   84 y/o F who presented to [facility] ER on 11/15 with reports of increasing shortness of breath.  The patient reported on admit that her SOB had been increasing throughout the day.  On arrival to ER, she was noted to be in AF with RVR to 160's.  Her family reported she has chronic diarrhea which is unchanged.  She had nausea / vomiting 2 days ago.  Initial work up noted her to be hypotensive with AFwRVR which improved after 1L LR.  Labs- WBC 16.2, Hgb 7.5, platelets 285, Na 134, K 5.3, glucose 152, BUN 25 / Cr 3.40, lactic acid 1.9, BNP 609, PCT 6.80.  After 1L NS, Hgb dropped to 6.9.    Past Medical History  ESRD (TTS), DM with neuropathy / bilateral BKA, C-Diff, Gout, obesity, PAD, AF on Eliquis/amiodarone CVA, L BKA 03/01/18  Significant Hospital Events   11/16 Admit with SOB  Consults:  PCCM 11/16  Procedures:    Significant Diagnostic Tests:     Micro Data:  BCx2 11/16 >>  Sputum 11/16 >>   Antimicrobials:  Levaquin 11/15 >>  Cefepime 11/6 >>  Interim History / Subjective:  RN reports SDU not comfortable taking patient as she is in [facility].  Received 1L of 1500 ml ordered.  Hgb dropped with hydration.   Objective   Blood pressure 98/63, pulse 60, temperature 97.7 F (36.5 C), temperature source Axillary, resp. rate (!) 28, SpO2 95 %.       No intake or output data in the 24 hours ending 03/25/18 0804 There were no vitals filed for this visit.  Examination: General: frail elderly female lying in bed in NAD HEENT: MM pink/dry Neuro: Awake, alert, oriented,  speech clear but occasionally difficult to understand (edentulous) CV: s1s2 irr irr, no m/r/g PULM: even/non-labored, lungs bilaterally clear anterior, diminished bases XL:KGMW, non-tender, bsx4 active  Extremities: warm/dry, no edema. L BKA with staples intact, R BKA, wound on right thigh, bilateral upper arm AV fistulas (old), R chest perm cath  Skin: no rashes or lesions, sacral wounds stage 2-3  Wounds      Resolved Hospital Problem list     Assessment & Plan:   Dyspnea -resolved, may have been related to [facility], +/- infiltrate P: Monitor on RA Follow CXR Pulmonary hygiene-IS, mobilize as able  RLL Infiltrate -small infiltrate RLL P: Narrow to zosyn  Follow intermittent CXR  Assess swallow evaluation   Chronic Hypotension  -on midodrine 10mg  TID P: Continue midodrine No role vasopressors at this time, monitor for changes (normal mentation) Complete 52ml/kg Minimize sedating medications > d/c ambien, reduce dose of home remeron  Atrial Fibrillation with RVR  -on amiodarone, eliquis chronically  -suspect element of volume depletion with last admit Hgb of 9, chronic diarrhea, vomiting, poor intake P: Continue PO amiodarone  Discontinue albuterol / atrovent  Correct anemia  Anemia  -last admit hgb 9  -hx rectal wall thickening > concern for proctitis vs mass during last admit 03/2018 P: Transfuse per ICU guidelines, 1 unit  PRBC May need to hold Eliquis  Consider GI eval pending stabilization  Add PPI   ESRD  -plan was to lower EDW at discharge  P: Nephrology consult per primary  L BKA -per Dr. Randie Heinzain 02/2018 P: Monitor incision/staples   Sacral Wounds, R Thigh Wound P: WOC consult   Chronic Diarrhea  -c-diff negative last admit  P: Monitor volume  Goals of Care P: Was DNR last admit  Palliative care consulted  Best practice:  Diet: Renal  Pain/Anxiety/Delirium protocol (if indicated): n/a VAP protocol (if indicated): n/a  DVT  prophylaxis: Eliquis  GI prophylaxis: PPI  Glucose control: SSI  Mobility: bed rest  Code Status: Full code  Family Communication: no family available at bedside am 11/16 Disposition: ICU.  TRH to pick back up in am 11/17.    Labs   CBC: Recent Labs  Lab 03/25/18 0024 03/25/18 0059 03/25/18 0516  WBC  --  16.2* 13.7*  NEUTROABS  --  14.8* 11.6*  HGB 11.6* 7.5* 6.9*  HCT 34.0* 27.1* 24.3*  MCV  --  79.7* 78.6*  PLT  --  285 260    Basic Metabolic Panel: Recent Labs  Lab 03/25/18 0024 03/25/18 0059  NA 134* 137  K 5.3* 3.3*  CL 107 103  CO2  --  20*  GLUCOSE 152* 171*  BUN 25* 17  CREATININE 3.40* 3.40*  CALCIUM  --  7.1*   GFR: CrCl cannot be calculated (Unknown ideal weight.). Recent Labs  Lab 03/25/18 0023 03/25/18 0059 03/25/18 0516 03/25/18 0517  PROCALCITON  --   --  6.80  --   WBC  --  16.2* 13.7*  --   LATICACIDVEN 4.69*  --   --  1.9    Liver Function Tests: No results for input(s): AST, ALT, ALKPHOS, BILITOT, PROT, ALBUMIN in the last 168 hours. No results for input(s): LIPASE, AMYLASE in the last 168 hours. No results for input(s): AMMONIA in the last 168 hours.  ABG    Component Value Date/Time   PHART 7.493 (H) 10/31/2012 1228   PCO2ART 31.4 (L) 10/31/2012 1228   PO2ART 63.0 (L) 10/31/2012 1228   HCO3 24.1 (H) 10/31/2012 1228   TCO2 20 (L) 03/25/2018 0024   ACIDBASEDEF 2.0 12/18/2006 1345   O2SAT 94.0 10/31/2012 1228     Coagulation Profile: Recent Labs  Lab 03/25/18 0516  INR 1.24    Cardiac Enzymes: No results for input(s): CKTOTAL, CKMB, CKMBINDEX, TROPONINI in the last 168 hours.  HbA1C: Hgb A1c MFr Bld  Date/Time Value Ref Range Status  01/09/2018 03:00 AM 5.5 4.8 - 5.6 % Final    Comment:    (NOTE) Pre diabetes:          5.7%-6.4% Diabetes:              >6.4% Glycemic control for   <7.0% adults with diabetes   11/11/2017 06:49 AM 5.7 (H) 4.8 - 5.6 % Final    Comment:    (NOTE) Pre diabetes:           5.7%-6.4% Diabetes:              >6.4% Glycemic control for   <7.0% adults with diabetes     CBG: No results for input(s): GLUCAP in the last 168 hours.  Review of Systems:   Gen: Denies fever, chills, weight change, fatigue, night sweats HEENT: Denies blurred vision, double vision, hearing loss, tinnitus, sinus congestion, rhinorrhea, sore throat, neck stiffness, dysphagia PULM: Denies shortness  of breath, cough, sputum production, hemoptysis, wheezing CV: Denies chest pain, edema, orthopnea, paroxysmal nocturnal dyspnea, palpitations GI: Denies abdominal pain, nausea, vomiting, diarrhea, hematochezia, melena, constipation, change in bowel habits GU: Denies dysuria, hematuria, polyuria, oliguria, urethral discharge Endocrine: Denies hot or cold intolerance, polyuria, polyphagia or appetite change Derm: Denies rash, dry skin, scaling or peeling skin change Heme: Denies easy bruising, bleeding, bleeding gums Neuro: Denies headache, numbness, weakness, slurred speech, loss of memory or consciousness   Past Medical History  She,  has a past medical history of Anemia, C. difficile diarrhea, Depression, Diabetes mellitus, Diabetic neuropathy (HCC), ESRD (end stage renal disease) on dialysis (HCC), GERD (gastroesophageal reflux disease), Gout, Hyperparathyroidism, MRSA (methicillin resistant staph aureus) culture positive, Obesity, Osteoarthritis, Peripheral arterial disease (HCC), S/P BKA (below knee amputation) unilateral, right (HCC), and Stroke (HCC).   Surgical History    Past Surgical History:  Procedure Laterality Date  . ABDOMINAL AORTOGRAM N/A 07/05/2016   Procedure: Abdominal Aortogram;  Surgeon: Maeola Harman, MD;  Location: [facility] INVASIVE CV LAB;  Service: Cardiovascular;  Laterality: N/A;  . ABDOMINAL AORTOGRAM N/A 06/24/2017   Procedure: ABDOMINAL AORTOGRAM;  Surgeon: Sherren Kerns, MD;  Location: [facility] INVASIVE CV LAB;  Service: Cardiovascular;  Laterality: N/A;  .  ABDOMINAL AORTOGRAM W/LOWER EXTREMITY N/A 01/23/2018   Procedure: ABDOMINAL AORTOGRAM W/LOWER EXTREMITY - Left Side;  Surgeon: Maeola Harman, MD;  Location: [facility] INVASIVE CV LAB;  Service: Cardiovascular;  Laterality: N/A;  . ABDOMINAL HYSTERECTOMY    . AMPUTATION Right 11/11/2017   Procedure: RIGHT BELOW KNEE AMPUTATION;  Surgeon: Nadara Mustard, MD;  Location: [facility] OR;  Service: Orthopedics;  Laterality: Right;  . AMPUTATION Left 03/01/2018   Procedure: BELOW KNEE LEFT LEG  AMPUTATION;  Surgeon: Maeola Harman, MD;  Location: [facility] OR;  Service: Vascular;  Laterality: Left;  . AV FISTULA PLACEMENT Left 07/07/2015   Procedure: ATTEMPTED INSERTION OFLEFT  ARTERIOVENOUS (AV) GORE-TEX GRAFT THIGH;  Surgeon: Fransisco Hertz, MD;  Location: [facility] OR;  Service: Vascular;  Laterality: Left;  . BELOW KNEE LEG AMPUTATION Right 11/11/2017  . BREAST EXCISIONAL BIOPSY Right 05/25/2006  . COLONOSCOPY    . FLEXIBLE SIGMOIDOSCOPY N/A 03/08/2018   Procedure: FLEXIBLE SIGMOIDOSCOPY;  Surgeon: Benancio Deeds, MD;  Location: [facility] ENDOSCOPY;  Service: Gastroenterology;  Laterality: N/A;  . LOWER EXTREMITY ANGIOGRAPHY Bilateral 07/05/2016   Procedure: Lower Extremity Angiography;  Surgeon: Maeola Harman, MD;  Location: [facility] INVASIVE CV LAB;  Service: Cardiovascular;  Laterality: Bilateral;  . LOWER EXTREMITY ANGIOGRAPHY Bilateral 06/24/2017   Procedure: Lower Extremity Angiography;  Surgeon: Sherren Kerns, MD;  Location: [facility] INVASIVE CV LAB;  Service: Cardiovascular;  Laterality: Bilateral;  . MULTIPLE TOOTH EXTRACTIONS    . PERIPHERAL VASCULAR BALLOON ANGIOPLASTY Left 01/23/2018   Procedure: PERIPHERAL VASCULAR BALLOON ANGIOPLASTY;  Surgeon: Maeola Harman, MD;  Location: [facility] INVASIVE CV LAB;  Service: Cardiovascular;  Laterality: Left;  peroneal  . PERIPHERAL VASCULAR INTERVENTION Right 06/24/2017   Procedure: PERIPHERAL VASCULAR INTERVENTION;  Surgeon: Sherren Kerns, MD;   Location: [facility] INVASIVE CV LAB;  Service: Cardiovascular;  Laterality: Right;  POPLITEALPTA SFA STENT  . SHUNTOGRAM Right 09/06/12   Thigh graft  . SP DECLOT AVGG Right Sep 14, 2012   Right thigh graft     Social History   reports that she quit smoking about 39 years ago. Her smoking use included cigarettes. She has never used smokeless tobacco. She reports that she does not drink alcohol or use drugs.  Family History   Her family history includes Cancer in her brother and father; Diabetes in her sister; Stroke in her mother.   Allergies Allergies  Allergen Reactions  . Codeine Hives  . Vancomycin Rash     Home Medications  Prior to Admission medications   Medication Sig Start Date End Date Taking? Authorizing Provider  acetaminophen (TYLENOL) 325 MG tablet Take 650 mg by mouth every 6 (six) hours as needed for fever.   Yes [provider]  amiodarone (PACERONE) 200 MG tablet Take 1 tablet (200 mg total) by mouth 2 (two) times daily. 02/24/18  Yes Medina-Vargas, Monina C, NP  apixaban (ELIQUIS) 2.5 MG TABS tablet Take 1 tablet (2.5 mg total) by mouth 2 (two) times daily. 02/24/18  Yes Medina-Vargas, Monina C, NP  clopidogrel (PLAVIX) 75 MG tablet Take 75 mg by mouth daily.   Yes [provider]  gabapentin (NEURONTIN) 100 MG capsule Take 1 capsule (100 mg total) by mouth at bedtime. 02/24/18  Yes Medina-Vargas, Monina C, NP  guaiFENesin (MUCINEX) 600 MG 12 hr tablet Take 600 mg by mouth 2 (two) times daily.   Yes [provider]  midodrine (PROAMATINE) 10 MG tablet Take 1 tablet (10 mg total) by mouth 2 (two) times daily with a meal. 02/27/18  Yes Medina-Vargas, Monina C, NP  mirtazapine (REMERON) 15 MG tablet Take 15 mg by mouth at bedtime.   Yes [provider]  RENVELA 800 MG tablet Take 2-3 tablets (1,600-2,400 mg total) by mouth See admin instructions. Take 2400 mg by mouth 3 times daily with meals and take 1600 mg by mouth with snacks 02/24/18   Yes Medina-Vargas, Monina C, NP  cinacalcet (SENSIPAR) 30 MG tablet Take 1 tablet (30 mg total) by mouth every evening. Patient not taking: Reported on 03/25/2018 02/24/18   Medina-Vargas, Monina C, NP  multivitamin (RENA-VIT) TABS tablet Take 1 tablet by mouth at bedtime. Patient not taking: Reported on 03/25/2018 03/10/18   Edsel Petrin, DO  oxyCODONE-acetaminophen (PERCOCET/ROXICET) 5-325 MG tablet Take 1 tablet by mouth every 6 (six) hours as needed. Patient not taking: Reported on 03/25/2018 02/24/18   Medina-Vargas, Margit Banda, NP     Critical care time: 30 minutes     Canary Brim, NP-C [facility] Pgr: [phone] or if no answer [phone] 03/25/2018, 8:04 AM

## 2018-03-25 NOTE — ED Notes (Signed)
ORDERED DIET TRAY FOR PT  

## 2018-03-25 NOTE — H&P (Addendum)
History and Physical    [name] ZOX:[id number] DOB: 1932/05/14 DOA: 04/01/2018  Referring MD/NP/PA:   PCP: Mila Palmer, MD   Patient coming from:  The patient is coming from home.  At baseline, pt is independent for most of ADL.        Chief Complaint: Shortness of breath, cough  HPI: [name] is a 84 y.o. female with medical history significant of diet-controlled diabetes, stroke, GERD, gout, depression, anemia, ESRD-HD (TTS), bilateral BKA, MRSA bacteremia, obesity, atrial fibrillation on Eliquis, chronic diarrhea, who presents with shortness breath and cough.  Patient states that developed shortness of breath today, which has been progressively getting worse.  She also has cough with yellow-colored sputum production.  No runny nose or sore throat. She denies chest pain, fever or chills.  She states that she has chronic diarrhea, which has not changed.  No nausea, vomiting or abdominal pain currently.  Per her niece, patient had nausea vomiting 2 days ago.  Patient does not make urine anymore.  No unilateral weakness.  She had dialysis on Thursday. Per her niece, patient sometimes chokes when eating food. Patient was initially hypotensive with blood pressure 78/36, which improved to 92/70 after 1L liter of Ringer's solution in ED. Pt has tunneled dialysis catheter on the right upper chest.  ED Course: pt was found to have WBC 16.2, lactic acid of 4.69, BNP 609, potassium 3.3, bicarbonate 20, creatinine 3.40, BUN 17.  Temperature 97.7, tachypnea, oxygen saturation 95% on BiPAP.  Chest x-ray showed right base infiltration.  Patient is admitted to stepdown bed as inpatient.  Review of Systems:   General: no fevers, chills, no body weight gain, has fatigue HEENT: no blurry vision, hearing changes or sore throat Respiratory: has dyspnea, coughing, wheezing CV: no chest pain, no palpitations GI: no nausea, vomiting, abdominal pain, constipation. Has diarrhea. GU: no dysuria,  burning on urination, increased urinary frequency, hematuria  Ext: no leg edema. S/p of lateral BKA Neuro: no unilateral weakness, numbness, or tingling, no vision change or hearing loss Skin: no rash, no skin tear. MSK: No muscle spasm, no deformity, no limitation of range of movement in spin Heme: No easy bruising.  Travel history: No recent long distant travel.  Allergy:  Allergies  Allergen Reactions  . Codeine Hives  . Vancomycin Rash    Past Medical History:  Diagnosis Date  . Anemia   . C. difficile diarrhea   . Depression   . Diabetes mellitus   . Diabetic neuropathy (HCC)   . ESRD (end stage renal disease) on dialysis (HCC)    TTS  . GERD (gastroesophageal reflux disease)   . Gout   . Hyperparathyroidism   . MRSA (methicillin resistant staph aureus) culture positive   . Obesity   . Osteoarthritis   . Peripheral arterial disease (HCC)    Gangrene-Left Great Toe  . S/P BKA (below knee amputation) unilateral, right (HCC)   . Stroke [facility])     Past Surgical History:  Procedure Laterality Date  . ABDOMINAL AORTOGRAM N/A 07/05/2016   Procedure: Abdominal Aortogram;  Surgeon: Maeola Harman, MD;  Location: [facility] INVASIVE CV LAB;  Service: Cardiovascular;  Laterality: N/A;  . ABDOMINAL AORTOGRAM N/A 06/24/2017   Procedure: ABDOMINAL AORTOGRAM;  Surgeon: Sherren Kerns, MD;  Location: [facility] INVASIVE CV LAB;  Service: Cardiovascular;  Laterality: N/A;  . ABDOMINAL AORTOGRAM W/LOWER EXTREMITY N/A 01/23/2018   Procedure: ABDOMINAL AORTOGRAM W/LOWER EXTREMITY - Left Side;  Surgeon: Maeola Harman, MD;  Location: [facility] INVASIVE CV LAB;  Service: Cardiovascular;  Laterality: N/A;  . ABDOMINAL HYSTERECTOMY    . AMPUTATION Right 11/11/2017   Procedure: RIGHT BELOW KNEE AMPUTATION;  Surgeon: Nadara Mustard, MD;  Location: [facility] OR;  Service: Orthopedics;  Laterality: Right;  . AMPUTATION Left 03/01/2018   Procedure: BELOW KNEE LEFT LEG  AMPUTATION;  Surgeon: Maeola Harman, MD;  Location: [facility] OR;  Service: Vascular;  Laterality: Left;  . AV FISTULA PLACEMENT Left 07/07/2015   Procedure: ATTEMPTED INSERTION OFLEFT  ARTERIOVENOUS (AV) GORE-TEX GRAFT THIGH;  Surgeon: Fransisco Hertz, MD;  Location: [facility] OR;  Service: Vascular;  Laterality: Left;  . BELOW KNEE LEG AMPUTATION Right 11/11/2017  . BREAST EXCISIONAL BIOPSY Right 05/25/2006  . COLONOSCOPY    . FLEXIBLE SIGMOIDOSCOPY N/A 03/08/2018   Procedure: FLEXIBLE SIGMOIDOSCOPY;  Surgeon: Benancio Deeds, MD;  Location: [facility] ENDOSCOPY;  Service: Gastroenterology;  Laterality: N/A;  . LOWER EXTREMITY ANGIOGRAPHY Bilateral 07/05/2016   Procedure: Lower Extremity Angiography;  Surgeon: Maeola Harman, MD;  Location: [facility] INVASIVE CV LAB;  Service: Cardiovascular;  Laterality: Bilateral;  . LOWER EXTREMITY ANGIOGRAPHY Bilateral 06/24/2017   Procedure: Lower Extremity Angiography;  Surgeon: Sherren Kerns, MD;  Location: [facility] INVASIVE CV LAB;  Service: Cardiovascular;  Laterality: Bilateral;  . MULTIPLE TOOTH EXTRACTIONS    . PERIPHERAL VASCULAR BALLOON ANGIOPLASTY Left 01/23/2018   Procedure: PERIPHERAL VASCULAR BALLOON ANGIOPLASTY;  Surgeon: Maeola Harman, MD;  Location: [facility] INVASIVE CV LAB;  Service: Cardiovascular;  Laterality: Left;  peroneal  . PERIPHERAL VASCULAR INTERVENTION Right 06/24/2017   Procedure: PERIPHERAL VASCULAR INTERVENTION;  Surgeon: Sherren Kerns, MD;  Location: [facility] INVASIVE CV LAB;  Service: Cardiovascular;  Laterality: Right;  POPLITEALPTA SFA STENT  . SHUNTOGRAM Right 09/06/12   Thigh graft  . SP DECLOT AVGG Right Sep 14, 2012   Right thigh graft    Social History:  reports that she quit smoking about 39 years ago. Her smoking use included cigarettes. She has never used smokeless tobacco. She reports that she does not drink alcohol or use drugs.  Family History:  Family History  Problem Relation Age of Onset  . Cancer Father        type unknown  . Stroke Mother     . Diabetes Sister   . Cancer Brother        type unknown     Prior to Admission medications   Medication Sig Start Date End Date Taking? Authorizing Provider  acetaminophen (TYLENOL) 325 MG tablet Take 650 mg by mouth every 6 (six) hours as needed for fever.   Yes [provider]  amiodarone (PACERONE) 200 MG tablet Take 1 tablet (200 mg total) by mouth 2 (two) times daily. 02/24/18  Yes Medina-Vargas, Monina C, NP  apixaban (ELIQUIS) 2.5 MG TABS tablet Take 1 tablet (2.5 mg total) by mouth 2 (two) times daily. 02/24/18  Yes Medina-Vargas, Monina C, NP  clopidogrel (PLAVIX) 75 MG tablet Take 75 mg by mouth daily.   Yes [provider]  gabapentin (NEURONTIN) 100 MG capsule Take 1 capsule (100 mg total) by mouth at bedtime. 02/24/18  Yes Medina-Vargas, Monina C, NP  guaiFENesin (MUCINEX) 600 MG 12 hr tablet Take 600 mg by mouth 2 (two) times daily.   Yes [provider]  midodrine (PROAMATINE) 10 MG tablet Take 1 tablet (10 mg total) by mouth 2 (two) times daily with a meal. 02/27/18  Yes Medina-Vargas, Monina C, NP  mirtazapine (REMERON) 15 MG tablet  Take 15 mg by mouth at bedtime.   Yes [provider]  RENVELA 800 MG tablet Take 2-3 tablets (1,600-2,400 mg total) by mouth See admin instructions. Take 2400 mg by mouth 3 times daily with meals and take 1600 mg by mouth with snacks 02/24/18  Yes Medina-Vargas, Monina C, NP  cinacalcet (SENSIPAR) 30 MG tablet Take 1 tablet (30 mg total) by mouth every evening. Patient not taking: Reported on 03/25/2018 02/24/18   Medina-Vargas, Monina C, NP  multivitamin (RENA-VIT) TABS tablet Take 1 tablet by mouth at bedtime. Patient not taking: Reported on 03/25/2018 03/10/18   Edsel PetrinMikhail, Maryann, DO  oxyCODONE-acetaminophen (PERCOCET/ROXICET) 5-325 MG tablet Take 1 tablet by mouth every 6 (six) hours as needed. Patient not taking: Reported on 03/25/2018 02/24/18   Gillis SantaMedina-Vargas, Monina C, NP    Physical Exam: Vitals:    03/25/18 0118 03/25/18 0120 03/25/18 0126 03/25/18 0215  BP: (!) 85/42 92/75 92/75  (!) 75/39  Pulse:    (!) 46  Resp: 18 (!) 30  (!) 29  Temp:      TempSrc:      SpO2:    98%   General: Not in acute distress HEENT:       Eyes: PERRL, EOMI, no scleral icterus.       ENT: No discharge from the ears and nose, no pharynx injection, no tonsillar enlargement.        Neck: No JVD, no bruit, no mass felt. Heme: No neck lymph node enlargement. Cardiac: S1/S2, RRR, No murmurs, No gallops or rubs. Respiratory: Has rhonchi bilaterally with mild wheezing GI: Soft, nondistended, nontender, no rebound pain, no organomegaly, BS present. GU: No hematuria Ext: No pitting leg edema bilaterally. S/p of bilateral BKA Musculoskeletal: No joint deformities, No joint redness or warmth, no limitation of ROM in spin. Skin: No rashes.  Neuro: Alert, oriented X3, cranial nerves II-XII grossly intact, moves all extremities normally.  Psych: Patient is not psychotic, no suicidal or hemocidal ideation.  Labs on Admission: I have personally reviewed following labs and imaging studies  CBC: Recent Labs  Lab 03/25/18 0024 03/25/18 0059  WBC  --  16.2*  NEUTROABS  --  14.8*  HGB 11.6* 7.5*  HCT 34.0* 27.1*  MCV  --  79.7*  PLT  --  285   Basic Metabolic Panel: Recent Labs  Lab 03/25/18 0024 03/25/18 0059  NA 134* 137  K 5.3* 3.3*  CL 107 103  CO2  --  20*  GLUCOSE 152* 171*  BUN 25* 17  CREATININE 3.40* 3.40*  CALCIUM  --  7.1*   GFR: CrCl cannot be calculated (Unknown ideal weight.). Liver Function Tests: No results for input(s): AST, ALT, ALKPHOS, BILITOT, PROT, ALBUMIN in the last 168 hours. No results for input(s): LIPASE, AMYLASE in the last 168 hours. No results for input(s): AMMONIA in the last 168 hours. Coagulation Profile: No results for input(s): INR, PROTIME in the last 168 hours. Cardiac Enzymes: No results for input(s): CKTOTAL, CKMB, CKMBINDEX, TROPONINI in the last 168  hours. BNP (last 3 results) No results for input(s): PROBNP in the last 8760 hours. HbA1C: No results for input(s): HGBA1C in the last 72 hours. CBG: No results for input(s): GLUCAP in the last 168 hours. Lipid Profile: No results for input(s): CHOL, HDL, LDLCALC, TRIG, CHOLHDL, LDLDIRECT in the last 72 hours. Thyroid Function Tests: No results for input(s): TSH, T4TOTAL, FREET4, T3FREE, THYROIDAB in the last 72 hours. Anemia Panel: No results for input(s): VITAMINB12, FOLATE, FERRITIN,  TIBC, IRON, RETICCTPCT in the last 72 hours. Urine analysis:    Component Value Date/Time   COLORURINE YELLOW 12/17/2006 0005   APPEARANCEUR TURBID (A) 12/17/2006 0005   LABSPEC 1.019 12/17/2006 0005   PHURINE 5.0 12/17/2006 0005   GLUCOSEU NEGATIVE 12/17/2006 0005   HGBUR MODERATE (A) 12/17/2006 0005   BILIRUBINUR SMALL (A) 12/17/2006 0005   KETONESUR 15 (A) 12/17/2006 0005   PROTEINUR NEGATIVE 12/17/2006 0005   UROBILINOGEN 0.2 12/17/2006 0005   NITRITE NEGATIVE 12/17/2006 0005   LEUKOCYTESUR LARGE (A) 12/17/2006 0005   Sepsis Labs: @LABRCNTIP (procalcitonin:4,lacticidven:4) )No results found for this or any previous visit (from the past 240 hour(s)).   Radiological Exams on Admission: Dg Chest Port 1 View  Result Date: 2018-04-08 CLINICAL DATA:  Acute onset of shortness of breath and wheezing. Diffuse crackles. EXAM: PORTABLE CHEST 1 VIEW COMPARISON:  Chest radiograph performed 01/08/2018 FINDINGS: The lungs are hypoexpanded. Right basilar airspace opacity raises concern for pneumonia. No pleural effusion or pneumothorax is seen. The cardiomediastinal silhouette is borderline normal in size. A right-sided dual-lumen catheter is noted ending at the right atrium. No acute osseous abnormalities are seen. IMPRESSION: Lungs hypoexpanded. Right basilar airspace opacity raises concern for pneumonia. Electronically Signed   By: Roanna Raider M.D.   On: Apr 08, 2018 23:58     EKG: Independently  reviewed.  Atrial fibrillation with RVR, QTC 466, low voltage, poor R wave progression, LAD.  Assessment/Plan Principal Problem:   Aspiration pneumonia (HCC) Active Problems:   Sepsis (HCC)   Depression   ESRD (end stage renal disease) on dialysis (HCC)   HCAP (healthcare-associated pneumonia)   Stroke (cerebrum) (HCC)   Anemia in ESRD (end-stage renal disease) (HCC)   Atrial fibrillation with RVR (HCC)   Acute on chronic respiratory failure with hypoxia (HCC)  Acute respiratory failure with hypoxia and sepsis due to aspiration pneumonia vs. HCAP: Patient likely had aspiration pneumonia given infiltration on the right base on chest x-ray, and occasionally choking when eating food.  HCAP is also possible.  Patient meets criteria for sepsis with leukocytosis, tachycardia, tachypnea, hypotension.  Lactic acid is elevated 4.69.  Blood pressure responded to IV fluid, improved from 78/70 to 92/75. Pt's mental status normal.  - will admit to SDU as inpt - IV Zosyn and Levaquin (patient is allergic to vancomycin.  Patient received 1 dose of cefepime by ED). - Mucinex for cough  - Atrovent nebs, and Xopenex Neb prn for SOB - Urine legionella and S. pneumococcal antigen - Follow up blood culture x2, sputum culture - will get Procalcitonin and trend lactic acid level per sepsis protocol - IVF: 500 ml x 3 of ringer solution bolus in ED, then 75 cc/h of NS-->will bolus as needed (patient has ESRD, limiting aggressive IV fluid resuscitation) - increase home Midodrine dose from 10 mg bid to tid - SLP - palliative care consult  ESRD (end stage renal disease) on dialysis (TTS): potassium 3.3, bicarbonate 20, creatinine 3.40, BUN 17.  -please call renal for dialysis in morning  Depression: Stable, no suicidal or homicidal ideations. -Continue home medications: Remeron  Stroke (cerebrum) (HCC): -on plavix  Anemia in ESRD (end-stage renal disease) (HCC): Hemoglobin 9.0 on 03/09/18--> 9.5 today.  No  active bleeding.  No dark stool. -Follow-up by CBC - Type cross screen  Atrial fibrillation with RVR (HCC): Heart rate up to 150s--> 110-120s currently.CHA2DS2-VASc Score is 8, needs oral anticoagulation. Patient is on Eliquis at home. -continue home amiodarone  Mild hypokalemia: Potassium 3.3.   -  KCl 20 mEQ -Follow-up by [facility]    Inpatient status:  # Patient requires inpatient status due to high intensity of service, high risk for further deterioration and high frequency of surveillance required.  I certify that at the point of admission it is my clinical judgment that the patient will require inpatient hospital care spanning beyond 2 midnights from the point of admission.  . This patient has multiple chronic comorbidities including diet-controlled diabetes, stroke, GERD, gout, depression, anemia, ESRD-HD (TTS), bilateral BKA, MRSA bacteremia, obesity, atrial fibrillation on Eliquis, chronic diarrhea . Now patient has presenting symptoms include shortness of breath, cough . The worrisome physical exam findings include hypotension, A. fib with RVR, bilateral rhonchi on auscultation . The initial radiographic and laboratory data are worrisome because of right basilar infiltration, leukocytosis, sepsis, elevated lactic acid, hypokalemia . Current medical needs: please see my assessment and plan . Predictability of an adverse outcome (risk): Patient has multiple comorbidities, presents with sepsis secondary to aspiration pneumonia versus HCAP.  Patient has hypotension and elevated lactic acid up to 4.69.  She also has atrial fibrillation with RVR.  Due to end-stage renal disease, cannot treat patient with aggressive IV fluid resuscitation.  Patient is at high risk of deteriorating.    DVT ppx: on eliquis Code Status: Full code (I discussed with patient in the presence of her power of attorney, son and 3 niece about code status, She wants to be full code). Family Communication: Yes, patient's  son and 3 nieces  at bed side Disposition Plan:  Anticipate discharge back to previous home environment Consults called:   none Admission status:  SDU/inpation       Date of Service 03/25/2018    Lorretta Harp [facility] Pager [phone]  If 7PM-7AM, please contact night-coverage [email or web address] Password [email or web address] 03/25/2018, 3:07 AM

## 2018-03-25 NOTE — Consult Note (Signed)
Renal Service Consult Note [name]  Jamie LippsBetty G [name] 03/25/2018 Jamie Krabbeobert D Johnella [name] Requesting Physician:  Dr Henrene PastorMcQuiad  Reason for Consult:  ESRD pt w/ hypotension HPI: The patient is a 84 y.o. year-old w/ hx of DM, bilat BKA, ESRD on HD who presented to ED this am w/ SOB came from home, +cough. Pt is on HD TTS schedule.  Asked to see for dialysis while here.    Patient on HD "a long time", gets HD TTS, has not missed HD recently.  Has long-term R ij cath.  States she came her for SOB and coughing x 1-2 days, +diarrhea also, no n/v, no abd pain or CP, no F/C/S.  White phlegm.     In ED pt was hypotensive w/ BP 70's, got 2L NS and BP's are now starting to pick up 7-8 hrs later w/ BP 106/75.  Also got IV Cefepime. WBC 16k, Hb 7.5  K 3.3, BUN 17  Cr 3.4  CO2 20  AG 14.  BNP 609  LA 4.6 > 1.9 on repeat after 5 hrs in ED.  PC 6.80.  CXR RLL infiltrate, L clear.  EKG afib / RVR w/ old MI inf and ant.        ROS  denies CP  no joint pain   no HA  no blurry vision  no rash  no nausea/ vomiting   Past Medical History  Past Medical History:  Diagnosis Date  . Anemia   . C. difficile diarrhea   . Depression   . Diabetes mellitus   . Diabetic neuropathy (HCC)   . ESRD (end stage renal disease) on dialysis (HCC)    TTS  . GERD (gastroesophageal reflux disease)   . Gout   . Hyperparathyroidism   . MRSA (methicillin resistant staph aureus) culture positive   . Obesity   . Osteoarthritis   . Peripheral arterial disease (HCC)    Gangrene-Left Great Toe  . S/P BKA (below knee amputation) unilateral, right (HCC)   . Stroke [facility](HCC)    Past Surgical History  Past Surgical History:  Procedure Laterality Date  . ABDOMINAL AORTOGRAM N/A 07/05/2016   Procedure: Abdominal Aortogram;  Surgeon: Maeola HarmanBrandon Christopher Cain, MD;  [facility];  Service: Cardiovascular;  Laterality: N/A;  . ABDOMINAL AORTOGRAM N/A 06/24/2017   Procedure: ABDOMINAL AORTOGRAM;  Surgeon:  Sherren KernsFields, Charles E, MD;  [facility];  Service: Cardiovascular;  Laterality: N/A;  . ABDOMINAL AORTOGRAM W/LOWER EXTREMITY N/A 01/23/2018   Procedure: ABDOMINAL AORTOGRAM W/LOWER EXTREMITY - Left Side;  Surgeon: Maeola Harmanain, Brandon Christopher, MD;  [facility];  Service: Cardiovascular;  Laterality: N/A;  . ABDOMINAL HYSTERECTOMY    . AMPUTATION Right 11/11/2017   Procedure: RIGHT BELOW KNEE AMPUTATION;  Surgeon: Nadara Mustarduda, Marcus V, MD;  [facility];  Service: Orthopedics;  Laterality: Right;  . AMPUTATION Left 03/01/2018   Procedure: BELOW KNEE LEFT LEG  AMPUTATION;  Surgeon: Maeola Harmanain, Brandon Christopher, MD;  [facility];  Service: Vascular;  Laterality: Left;  . AV FISTULA PLACEMENT Left 07/07/2015   Procedure: ATTEMPTED INSERTION OFLEFT  ARTERIOVENOUS (AV) GORE-TEX GRAFT THIGH;  Surgeon: Fransisco HertzBrian L Chen, MD;  Location: [facility] OR;  Service: Vascular;  Laterality: Left;  . BELOW KNEE LEG AMPUTATION Right 11/11/2017  . BREAST EXCISIONAL BIOPSY Right 05/25/2006  . COLONOSCOPY    . FLEXIBLE SIGMOIDOSCOPY N/A 03/08/2018   Procedure: FLEXIBLE SIGMOIDOSCOPY;  Surgeon: Benancio DeedsArmbruster, Steven P, MD;  Location: [facility] ENDOSCOPY;  Service:  Gastroenterology;  Laterality: N/A;  . LOWER EXTREMITY ANGIOGRAPHY Bilateral 07/05/2016   Procedure: Lower Extremity Angiography;  Surgeon: Maeola Harman, MD;  Location: [facility] INVASIVE CV LAB;  Service: Cardiovascular;  Laterality: Bilateral;  . LOWER EXTREMITY ANGIOGRAPHY Bilateral 06/24/2017   Procedure: Lower Extremity Angiography;  Surgeon: Sherren Kerns, MD;  Location: [facility] INVASIVE CV LAB;  Service: Cardiovascular;  Laterality: Bilateral;  . MULTIPLE TOOTH EXTRACTIONS    . PERIPHERAL VASCULAR BALLOON ANGIOPLASTY Left 01/23/2018   Procedure: PERIPHERAL VASCULAR BALLOON ANGIOPLASTY;  Surgeon: Maeola Harman, MD;  Location: [facility] INVASIVE CV LAB;  Service: Cardiovascular;  Laterality: Left;  peroneal  . PERIPHERAL VASCULAR INTERVENTION  Right 06/24/2017   Procedure: PERIPHERAL VASCULAR INTERVENTION;  Surgeon: Sherren Kerns, MD;  [facility] INVAS[facility] CV LAB;  Service: Cardiovascular;  Laterality: Right;  POPLITEALPTA SFA STENT  . SHUNTOGRAM Right 09/06/12   Thigh graft  . SP DECLOT AVGG Right Sep 14, 2012   Right thigh graft   Family History  Family History  Problem Relation Age of Onset  . Cancer Father        type unknown  . Stroke Mother   . Diabetes Sister   . Cancer Brother        type unknown   Social History  reports that she quit smoking about 39 years ago. Her smoking use included cigarettes. She has never used smokeless tobacco. She reports that she does not drink alcohol or use drugs. Allergies  Allergies  Allergen Reactions  . Codeine Hives  . Vancomycin Rash   Home medications Prior to Admission medications   Medication Sig Start Date End Date Taking? Authorizing Provider  acetaminophen (TYLENOL) 325 MG tablet Take 650 mg by mouth every 6 (six) hours as needed for fever.   Yes [provider]  amiodarone (PACERONE) 200 MG tablet Take 1 tablet (200 mg total) by mouth 2 (two) times daily. 02/24/18  Yes Medina-Vargas, Monina C, NP  apixaban (ELIQUIS) 2.5 MG TABS tablet Take 1 tablet (2.5 mg total) by mouth 2 (two) times daily. 02/24/18  Yes Medina-Vargas, Monina C, NP  clopidogrel (PLAVIX) 75 MG tablet Take 75 mg by mouth daily.   Yes [provider]  gabapentin (NEURONTIN) 100 MG capsule Take 1 capsule (100 mg total) by mouth at bedtime. 02/24/18  Yes Medina-Vargas, Monina C, NP  guaiFENesin (MUCINEX) 600 MG 12 hr tablet Take 600 mg by mouth 2 (two) times daily.   Yes [provider]  midodrine (PROAMATINE) 10 MG tablet Take 1 tablet (10 mg total) by mouth 2 (two) times daily with a meal. 02/27/18  Yes Medina-Vargas, Monina C, NP  mirtazapine (REMERON) 15 MG tablet Take 15 mg by mouth at bedtime.   Yes [provider]  RENVELA 800 MG tablet Take 2-3 tablets  (1,600-2,400 mg total) by mouth See admin instructions. Take 2400 mg by mouth 3 times daily with meals and take 1600 mg by mouth with snacks 02/24/18  Yes Medina-Vargas, Monina C, NP  cinacalcet (SENSIPAR) 30 MG tablet Take 1 tablet (30 mg total) by mouth every evening. Patient not taking: Reported on 03/25/2018 02/24/18   Medina-Vargas, Monina C, NP  multivitamin (RENA-VIT) TABS tablet Take 1 tablet by mouth at bedtime. Patient not taking: Reported on 03/25/2018 03/10/18   Edsel Petrin, DO  oxyCODONE-acetaminophen (PERCOCET/ROXICET) 5-325 MG tablet Take 1 tablet by mouth every 6 (six) hours as needed. Patient not taking: Reported on 03/25/2018 02/24/18   Medina-Vargas, Margit Banda, NP   Liver  Function Tests No results for input(s): AST, ALT, ALKPHOS, BILITOT, PROT, ALBUMIN in the last 168 hours. No results for input(s): LIPASE, AMYLASE in the last 168 hours. CBC Recent Labs  Lab 03/25/18 0024 03/25/18 0059 03/25/18 0516  WBC  --  16.2* 13.7*  NEUTROABS  --  14.8* 11.6*  HGB 11.6* 7.5* 6.9*  HCT 34.0* 27.1* 24.3*  MCV  --  79.7* 78.6*  PLT  --  285 260   Basic Metabolic Panel Recent Labs  Lab 03/25/18 0024 03/25/18 0059  NA 134* 137  K 5.3* 3.3*  CL 107 103  CO2  --  20*  GLUCOSE 152* 171*  BUN 25* 17  CREATININE 3.40* 3.40*  CALCIUM  --  7.1*   Iron/TIBC/Ferritin/ %Sat    Component Value Date/Time   IRON 26 (L) 12/19/2006 1455   TIBC 86 (L) 12/19/2006 1455   FERRITIN 1076 (H) 12/19/2006 1455   IRONPCTSAT 30 12/19/2006 1455   IRONPCTSAT 16 (L) 10/18/2005 0907    Vitals:   03/25/18 0745 03/25/18 0827 03/25/18 0830 03/25/18 0915  BP: 98/63 (!) 97/57 108/87 106/75  Pulse:    91  Resp: (!) 28 18 (!) 28 (!) 43  Temp:      TempSrc:      SpO2:    94%   Exam Gen frail, elderly AAF, coughing, congested, ^WOB No rash, cyanosis or gangrene Sclera anicteric, throat clear  No jvd or bruits Chest bilat coarse rales/ rhonchi, R > L , occ wheezing RRR tachy irreg irreg  no mrg Abd soft ntnd no mass or ascites +bs obese GU defer MS well healed R BKA, recent L BKA w/ staples, clean wounds, no joint effusions or deformity Ext no LE or UE edema, no wounds or ulcers Neuro Ox 3, nonfocal, gen weak x 4 ext    Home meds:  - amiodarone 200 bid/ midodrine 10 tid/ clopidogrel 75/ apixaban 2.5 bid  - gabapentin 100 hs/ mirtazapine 15 hs/ oxy +aceta qid prn  - MVI/ renvela 800 tid ac/ prns   Dialysis: GO TTS  4h 65kg  3K/2.5 bath  Hep 6000   RIJ TDC - hect 1 ug  - M 150 last 11/14     Impression/ Plan: 1. Shock - prob septic, RLL infiltrate on CXR. Got NS bolus and IVF's, BP's better.  Abx per primary team.  2. ESRD - on HD TTS.  Plan HD today, no gross vol ^ on exam. Min UF.  3. Chronic afib- w/ RVR.  Per primary.  On amio/ eliquis 4. Hx CVA 5. PAD bilat BKA - R BKjA in July 2019 and L BKA in Oct 2019.   6. DM - off insulin 7. Anemia ckd - Hb low 7's, just got ESA on 11/14, check Fe and check stools and transfuse prn 8. MBD ckd - cont meds      Vinson Moselle MD [name] pager [phone]   03/25/2018, 9:57 AM

## 2018-03-25 NOTE — Progress Notes (Signed)
This is a no charge note.  For further details, please see H&P from earlier today by Dr. Clyde LundborgNiu.  Patient admitted overnight with sepsis, septic shock from what appears to be pneumonia.  Case complicated by ESRD, chronic hypotension on midodrine, recent Cdiff, recent BKA.  She is persistently hypotensive this morning despite crystalloid resuscitation.  Will consult CCM regarding initiating pressors.

## 2018-03-25 NOTE — Progress Notes (Signed)
eLink Physician-Brief Progress Note Patient Name: Jamie LippsBetty G [name] DOB: 02/08/1933 MRN: [id number]   Date of Service  03/25/2018  HPI/Events of Note  Hypotension - BP = 63/49. No CVL or CVP. LVEF = 45% to 50%. ESRD.  eICU Interventions  Will order: 1. Bolus with 0.9 NaCl 500 mL IV now. 2. Phenylephrine IV infusion. Titrate to MAP >= 75.     Intervention Category Major Interventions: Hypotension - evaluation and management  Sommer,Steven Eugene 03/25/2018, 9:44 PM

## 2018-03-25 NOTE — Progress Notes (Signed)
[facility] P[facility]M  Clarification, changing code status to DNR.  I talked to [facility] Hospital[facility]rtle, the patient's niece and healthcare power of attorney.  Myrtle agrees that the patient should not undergo life support, CPR, or electric shocks because she is too sick to tolerate any of these treatments.  I completely agree.  We will continue full medical support and try to get her through this illness but we will change her CODE STATUS back to DNR.  Heber CarolinaBrent Yarel Kilcrease, MD [facility] Pager: [phone] Cell: [phone] If no response, call [phone]

## 2018-03-25 NOTE — ED Notes (Signed)
Unsuccessful blood draw;QNS.  I will communicate to other phleb to get labs.

## 2018-03-25 NOTE — ED Notes (Signed)
Admitting doctor at  The bedside 

## 2018-03-25 NOTE — ED Notes (Signed)
I attempted report tto 6e  Charge nurse reportedly will not accept this pt with bps in the 70s

## 2018-03-25 NOTE — ED Notes (Signed)
Spoke to Dr. Maryfrances Bunnellanford about pt's vitals and that stepdown floor will not accept. Dr. Maryfrances Bunnellanford will contact intensivist

## 2018-03-25 NOTE — ED Notes (Addendum)
Attempted calling report to 47M x 1

## 2018-03-25 NOTE — ED Notes (Signed)
Pt turned to her rt side  Green colored stool cleaned from her buttocks

## 2018-03-25 NOTE — ED Notes (Signed)
House coverage notified  Call being placed to the admitting doctor

## 2018-03-25 NOTE — Evaluation (Signed)
Clinical/Bedside Swallow Evaluation Patient Details  Name: [name] MRN: [id number] Date of Birth: 05-20-1932  Today's Date: 03/25/2018 Time: SLP Start Time (ACUTE ONLY): 1500 SLP Stop Time (ACUTE ONLY): 1520 SLP Time Calculation (min) (ACUTE ONLY): 20 min  Past Medical History:  Past Medical History:  Diagnosis Date  . Anemia   . C. difficile diarrhea   . Depression   . Diabetes mellitus   . Diabetic neuropathy (HCC)   . ESRD (end stage renal disease) on dialysis (HCC)    TTS  . GERD (gastroesophageal reflux disease)   . Gout   . Hyperparathyroidism   . MRSA (methicillin resistant staph aureus) culture positive   . Obesity   . Osteoarthritis   . Peripheral arterial disease (HCC)    Gangrene-Left Great Toe  . S/P BKA (below knee amputation) unilateral, right (HCC)   . Stroke [facility])    Past Surgical History:  Past Surgical History:  Procedure Laterality Date  . ABDOMINAL AORTOGRAM N/A 07/05/2016   Procedure: Abdominal Aortogram;  Surgeon: Maeola Harman, MD;  Location: [facility] INVASIVE CV LAB;  Service: Cardiovascular;  Laterality: N/A;  . ABDOMINAL AORTOGRAM N/A 06/24/2017   Procedure: ABDOMINAL AORTOGRAM;  Surgeon: Sherren Kerns, MD;  Location: [facility] INVASIVE CV LAB;  Service: Cardiovascular;  Laterality: N/A;  . ABDOMINAL AORTOGRAM W/LOWER EXTREMITY N/A 01/23/2018   Procedure: ABDOMINAL AORTOGRAM W/LOWER EXTREMITY - Left Side;  Surgeon: Maeola Harman, MD;  Location: [facility] INVASIVE CV LAB;  Service: Cardiovascular;  Laterality: N/A;  . ABDOMINAL HYSTERECTOMY    . AMPUTATION Right 11/11/2017   Procedure: RIGHT BELOW KNEE AMPUTATION;  Surgeon: Nadara Mustard, MD;  Location: [facility] OR;  Service: Orthopedics;  Laterality: Right;  . AMPUTATION Left 03/01/2018   Procedure: BELOW KNEE LEFT LEG  AMPUTATION;  Surgeon: Maeola Harman, MD;  Location: [facility] OR;  Service: Vascular;  Laterality: Left;  . AV FISTULA PLACEMENT Left 07/07/2015   Procedure: ATTEMPTED  INSERTION OFLEFT  ARTERIOVENOUS (AV) GORE-TEX GRAFT THIGH;  Surgeon: Fransisco Hertz, MD;  Location: [facility] OR;  Service: Vascular;  Laterality: Left;  . BELOW KNEE LEG AMPUTATION Right 11/11/2017  . BREAST EXCISIONAL BIOPSY Right 05/25/2006  . COLONOSCOPY    . FLEXIBLE SIGMOIDOSCOPY N/A 03/08/2018   Procedure: FLEXIBLE SIGMOIDOSCOPY;  Surgeon: Benancio Deeds, MD;  Location: [facility] ENDOSCOPY;  Service: Gastroenterology;  Laterality: N/A;  . LOWER EXTREMITY ANGIOGRAPHY Bilateral 07/05/2016   Procedure: Lower Extremity Angiography;  Surgeon: Maeola Harman, MD;  Location: [facility] INVASIVE CV LAB;  Service: Cardiovascular;  Laterality: Bilateral;  . LOWER EXTREMITY ANGIOGRAPHY Bilateral 06/24/2017   Procedure: Lower Extremity Angiography;  Surgeon: Sherren Kerns, MD;  Location: [facility] INVASIVE CV LAB;  Service: Cardiovascular;  Laterality: Bilateral;  . MULTIPLE TOOTH EXTRACTIONS    . PERIPHERAL VASCULAR BALLOON ANGIOPLASTY Left 01/23/2018   Procedure: PERIPHERAL VASCULAR BALLOON ANGIOPLASTY;  Surgeon: Maeola Harman, MD;  Location: [facility] INVASIVE CV LAB;  Service: Cardiovascular;  Laterality: Left;  peroneal  . PERIPHERAL VASCULAR INTERVENTION Right 06/24/2017   Procedure: PERIPHERAL VASCULAR INTERVENTION;  Surgeon: Sherren Kerns, MD;  Location: [facility] INVASIVE CV LAB;  Service: Cardiovascular;  Laterality: Right;  POPLITEALPTA SFA STENT  . SHUNTOGRAM Right 09/06/12   Thigh graft  . SP DECLOT AVGG Right Sep 14, 2012   Right thigh graft   HPI:  Patient is an 84 y.o. female with PMH: diet controlled DM, CVA, GERD, gout, depression, anemia, ESRD-HD (TTS), bilateral BKA, MRSA bacteremia, obesity, atrial fibrillation on Eliquis, chronic  diarrhea, who presented to the ED with SOB and cough. CXR revealed: "Right basilar airspace opacity raises concern for pneumonia" and so patient is being treated for PNA.   Assessment / Plan / Recommendation Clinical Impression  Patient presents with an  [facility]oph[facility]. She did not exhbiit any overt s/s of aspiration or penetration, swallow initiation was timely, laryngeal elevation and pharyngeal contraction were both WFL. Patient is edentulous but she stated she does not use her dentures to eat and that she can eat all textures of foods. She reported, "I eat crackers and peanut butter" but that she has to "take her time" when chewing.  SLP Visit Diagnosis: Dysphagia, unspecified (R13.10)    Aspiration Risk  No limitations    Diet Recommendation Regular;Thin liquid   Liquid Administration via: Cup;Straw Medication Administration: Whole meds with liquid Supervision: Patient able to self feed Postural Changes: Seated upright at 90 degrees    Other  Recommendations Oral Care Recommendations: Oral care BID   Follow up Recommendations None      Frequency and Duration   N/A         Prognosis    N/A    Swallow Study   General Date of Onset: 02/26/18 HPI: Patient is an 84 y.o. female with PMH: diet controlled DM, CVA, GERD, gout, depression, anemia, ESRD-HD (TTS), bilateral BKA, MRSA bacteremia, obesity, atrial fibrillation on Eliquis, chronic diarrhea, who presented to the ED with SOB and cough. CXR revealed: "Right basilar airspace opacity raises concern for pneumonia" and so patient is being treated for PNA. Type of Study: Bedside Swallow Evaluation Previous Swallow Assessment: N/A Diet Prior to this Study: Regular;Thin liquids Temperature Spikes Noted: No Respiratory Status: Room air History of Recent Intubation: No Behavior/Cognition: Alert;Cooperative;Pleasant mood Oral Cavity Assessment: Within Functional Limits Oral Care Completed by SLP: No Oral Cavity - Dentition: Dentures, not available;Edentulous Self-Feeding Abilities: Needs set up Patient Positioning: Upright in bed Baseline Vocal Quality: Normal Volitional Cough: Weak Volitional Swallow: Able to elicit    Oral/Motor/Sensory Function Overall  Oral Motor/Sensory Function: Within functional limits   Ice Chips Ice chips: Not tested   Thin Liquid Thin Liquid: Within functional limits Presentation: Straw;Cup Other Comments: No overt s/s of aspiration or penetration observed.     Nectar Thick     Honey Thick     Puree Puree: Within functional limits Presentation: Spoon   Solid     Solid: Impaired Oral Phase Impairments: Impaired mastication Other Comments: Patient stated that she does not use her dentures and can eat all textures but "I take my time with it". Patient did take time to [facility] but suspect this is her baseline.      Angela NevinJohn T. Kashmere Daywalt, MA, CCC-SLP Speech Therapy [facility]

## 2018-03-25 NOTE — ED Notes (Signed)
Unsuccessful blood draw 

## 2018-03-25 NOTE — Progress Notes (Signed)
Pharmacy Antibiotic Note  Jamie LippsBetty G [name] is a 84 y.o. female admitted on 03/11/2018 with pneumonia.  Pharmacy has been consulted for zosyn and levaquin dosing.  Plan: Levaquin 750 mg IV x 1 then 500 mg IV q48 hours Zosyn 3.375 gm IV q12 hours    Temp (24hrs), Avg:97.7 F (36.5 C), Min:97.7 F (36.5 C), Max:97.7 F (36.5 C)  Recent Labs  Lab 03/25/18 0023 03/25/18 0024 03/25/18 0059  WBC  --   --  16.2*  CREATININE  --  3.40* 3.40*  LATICACIDVEN 4.69*  --   --     CrCl cannot be calculated (Unknown ideal weight.).    Allergies  Allergen Reactions  . Codeine Hives  . Vancomycin Rash    Thank you for allowing pharmacy to be a part of this patient's care.  Talbert CageSeay, Elizer Bostic Poteet 03/25/2018 2:41 AM

## 2018-03-25 NOTE — ED Notes (Signed)
PAGED ADMITTING 

## 2018-03-26 ENCOUNTER — Inpatient Hospital Stay (HOSPITAL_COMMUNITY): Payer: Medicare Other

## 2018-03-26 LAB — CBC
HEMATOCRIT: 33 % — AB (ref 36.0–46.0)
Hemoglobin: 9 g/dL — ABNORMAL LOW (ref 12.0–15.0)
MCH: 22.6 pg — ABNORMAL LOW (ref 26.0–34.0)
MCHC: 27.3 g/dL — AB (ref 30.0–36.0)
MCV: 82.7 fL (ref 80.0–100.0)
PLATELETS: 369 10*3/uL (ref 150–400)
RBC: 3.99 MIL/uL (ref 3.87–5.11)
RDW: 22.8 % — AB (ref 11.5–15.5)
WBC: 22.9 10*3/uL — AB (ref 4.0–10.5)
nRBC: 0.9 % — ABNORMAL HIGH (ref 0.0–0.2)

## 2018-03-26 LAB — BASIC METABOLIC PANEL
Anion gap: 13 (ref 5–15)
BUN: 11 mg/dL (ref 8–23)
CO2: 18 mmol/L — AB (ref 22–32)
CREATININE: 2.15 mg/dL — AB (ref 0.44–1.00)
Calcium: 6.6 mg/dL — ABNORMAL LOW (ref 8.9–10.3)
Chloride: 107 mmol/L (ref 98–111)
GFR calc non Af Amer: 20 mL/min — ABNORMAL LOW (ref >60)
GFR, EST AFRICAN AMERICAN: 23 mL/min — AB (ref >60)
GLUCOSE: 206 mg/dL — AB (ref 70–99)
Potassium: 3.6 mmol/L (ref 3.5–5.1)
Sodium: 138 mmol/L (ref 135–145)

## 2018-03-26 LAB — GLUCOSE, CAPILLARY
GLUCOSE-CAPILLARY: 48 mg/dL — AB (ref 70–99)
GLUCOSE-CAPILLARY: 131 mg/dL — AB (ref 70–99)
Glucose-Capillary: 63 mg/dL — ABNORMAL LOW (ref 70–99)
Glucose-Capillary: 100 mg/dL — ABNORMAL HIGH (ref 70–99)
Glucose-Capillary: 92 mg/dL (ref 70–99)

## 2018-03-26 LAB — CORTISOL: CORTISOL PLASMA: 44.4 ug/dL

## 2018-03-26 LAB — PREPARE RBC (CROSSMATCH)

## 2018-03-26 MED ORDER — HYDROCORTISONE NA SUCCINATE PF 100 MG IJ SOLR: 50.0000 mg | Freq: Two times a day (BID) | INTRAMUSCULAR | Status: DC

## 2018-03-26 MED ORDER — SODIUM CHLORIDE 0.9 % IV SOLN
INTRAVENOUS | Status: DC | PRN
  Administered 2018-03-26: 250 mL via INTRAVENOUS

## 2018-03-26 MED ORDER — HYDROCORTISONE NA SUCCINATE PF 100 MG IJ SOLR
100.0000 mg | Freq: Once | INTRAMUSCULAR | Status: AC
  Administered 2018-03-26: 100 mg via INTRAVENOUS
  Filled 2018-03-26: qty 2

## 2018-03-26 MED ORDER — LEVALBUTEROL HCL 0.63 MG/3ML IN NEBU
0.6300 mg | INHALATION_SOLUTION | Freq: Three times a day (TID) | RESPIRATORY_TRACT | Status: DC
  Administered 2018-03-26: 0.63 mg via RESPIRATORY_TRACT
  Filled 2018-03-26: qty 3

## 2018-03-26 MED ORDER — DEXTROSE 50 % IV SOLN
INTRAVENOUS | Status: AC
  Administered 2018-03-26: 25 mL
  Filled 2018-03-26: qty 50

## 2018-03-26 MED ORDER — DEXTROSE 50 % IV SOLN
INTRAVENOUS | Status: AC
  Administered 2018-03-26: 50 mL
  Filled 2018-03-26: qty 50

## 2018-03-26 MED ORDER — CAMPHOR-MENTHOL 0.5-0.5 % EX LOTN
TOPICAL_LOTION | CUTANEOUS | Status: DC | PRN
  Filled 2018-03-26: qty 222

## 2018-03-26 MED ORDER — SODIUM CHLORIDE 0.9 % IV SOLN
0.0000 ug/min | INTRAVENOUS | Status: DC
  Administered 2018-03-26: 180 ug/min via INTRAVENOUS
  Administered 2018-03-26 (×2): 160 ug/min via INTRAVENOUS
  Filled 2018-03-26 (×3): qty 40

## 2018-03-26 MED ORDER — SODIUM CHLORIDE 0.9% IV SOLUTION
Freq: Once | INTRAVENOUS | Status: AC
  Administered 2018-03-26: 12:00:00 via INTRAVENOUS

## 2018-03-27 ENCOUNTER — Ambulatory Visit (INDEPENDENT_AMBULATORY_CARE_PROVIDER_SITE_OTHER): Payer: Medicare Other | Admitting: Orthopedic Surgery

## 2018-03-27 LAB — TYPE AND SCREEN
ABO/RH(D): O POS
Antibody Screen: POSITIVE
DAT, IgG: NEGATIVE
UNIT DIVISION: 0
Unit division: 0
Unit division: 0
Unit division: 0

## 2018-03-27 LAB — BPAM RBC
BLOOD PRODUCT EXPIRATION DATE: 201912112359
BLOOD PRODUCT EXPIRATION DATE: 201912122359
BLOOD PRODUCT EXPIRATION DATE: 201912152359
BLOOD PRODUCT EXPIRATION DATE: 201912152359
ISSUE DATE / TIME: 201911161416
ISSUE DATE / TIME: 201911171117
ISSUE DATE / TIME: 201911152232
ISSUE DATE / TIME: 201911161154
UNIT TYPE AND RH: 5100
UNIT TYPE AND RH: 5100
Unit Type and Rh: 5100
Unit Type and Rh: 5100

## 2018-03-28 MED FILL — Medication: Qty: 1 | Status: AC

## 2018-03-30 LAB — CULTURE, BLOOD (ROUTINE X 2)
CULTURE: NO GROWTH
Culture: NO GROWTH
SPECIAL REQUESTS: ADEQUATE

## 2018-03-31 ENCOUNTER — Encounter: Payer: Medicare Other | Admitting: Family

## 2018-04-02 ENCOUNTER — Other Ambulatory Visit: Payer: Self-pay | Admitting: Adult Health

## 2018-04-02 DIAGNOSIS — I4821 Permanent atrial fibrillation: Secondary | ICD-10-CM

## 2018-04-04 ENCOUNTER — Other Ambulatory Visit: Payer: Self-pay | Admitting: Adult Health

## 2018-04-04 DIAGNOSIS — I4821 Permanent atrial fibrillation: Secondary | ICD-10-CM

## 2018-04-09 NOTE — Progress Notes (Signed)
[facility] Progress Note  Subjective: appears tired but eating breakfast, being fed by the RN.  No sob, cough or other c/o's at this time.   Vitals:   03-29-18 0800 03/29/2018 1110 03-29-18 1121 2018/03/29 1140  BP: (!) 96/49 104/88  90/62  Pulse: (!) 104 (!) 113 (!) 109 (!) 107  Resp: (!) 27 (!) 26 (!) 27 (!) 25  Temp: 97.6 F (36.4 C) 97.8 F (36.6 C)  97.7 F (36.5 C)  TempSrc: Axillary Oral  Oral  SpO2: 97% 95% 98% 94%  Weight:        Inpatient medications: . amiodarone  200 mg Oral Q12H   Followed by  . [START ON 04/02/2018] amiodarone  200 mg Oral Daily  . Chlorhexidine Gluconate Cloth  6 each Topical Q0600  . clopidogrel  75 mg Oral Daily  . guaiFENesin  600 mg Oral BID  . hydrocortisone sod succinate (SOLU-CORTEF) inj  50 mg Intravenous Q12H  . levalbuterol  0.63 mg Nebulization Q8H  . midodrine  10 mg Oral TID WC  . pantoprazole  40 mg Oral Q1200  . sevelamer carbonate  2,400 mg Oral TID WC   . sodium chloride Stopped (03-29-2018 0720)  . amiodarone 30 mg/hr (03-29-2018 0800)  . phenylephrine (NEO-SYNEPHRINE) Adult infusion 180 mcg/min (03-29-18 0956)  . piperacillin-tazobactam (ZOSYN)  IV Stopped (29-Mar-2018 0656)   sodium chloride, acetaminophen, ondansetron (ZOFRAN) IV  Iron/TIBC/Ferritin/ %Sat    Component Value Date/Time   IRON 26 (L) 12/19/2006 1455   TIBC 86 (L) 12/19/2006 1455   FERRITIN 1076 (H) 12/19/2006 1455   IRONPCTSAT 30 12/19/2006 1455   IRONPCTSAT 16 (L) 10/18/2005 1610    Exam: Gen frail, elderly AAF, coughing, congested, ^WOB No rash, cyanosis or gangrene Sclera anicteric, throat clear  No jvd or bruits Chest bilat coarse rales/ rhonchi, R > L , occ wheezing RRR tachy irreg irreg no mrg Abd soft ntnd no mass or ascites +bs obese GU defer MS well healed R BKA, recent L BKA w/ staples, clean wounds, no joint effusions or deformity Ext no LE or UE edema, no wounds or ulcers Neuro Ox 3, nonfocal, gen weak x 4 ext    Home  meds:  - amiodarone 200 bid/ midodrine 10 tid/ clopidogrel 75/ apixaban 2.5 bid  - gabapentin 100 hs/ mirtazapine 15 hs/ oxy +aceta qid prn  - MVI/ renvela 800 tid ac/ prns   Dialysis: GO TTS  4h 65kg  3K/2.5 bath  Hep 6000   RIJ TDC - hect 1 ug  - M 150 last 11/14   Impression/ Plan: 1. Shock- r/o sepsis, poss PNA. On IV abx. Pt was made DNR yest afternoon by CCM after conversation w/ niece (POA), but then was started on pressors overnight. Would reconsider this idea since she is full DNR, have d/w primary MD.   2. ESRD - on HD TTS.  HD Sat. Cont TTS schedule.  3. Chronic afib- w/ RVR.  Per primary.  On amio/ eliquis 4. Hx CVA 5. PAD bilat BKA - R BKA in July 2019 and L BKA in Oct 2019.   6. DM - off insulin 7. Anemia ckd - Hb low 7's, just got ESA on 11/14, check Fe and check stools and transfuse prn 8. MBD ckd - cont meds  9. DNR   Vinson Moselle MD [facility] pager [phone]   03-29-18, 11:47 AM   Recent Labs  Lab 03/25/18 0059 03/25/18 0516 03-29-2018 1002  NA 137  --  138  K 3.3*  --  3.6  CL 103  --  107  CO2 20*  --  18*  GLUCOSE 171*  --  206*  BUN 17  --  11  CREATININE 3.40*  --  2.15*  CALCIUM 7.1*  --  6.6*  INR  --  1.24  --    No results for input(s): AST, ALT, ALKPHOS, BILITOT, PROT in the last 168 hours. Recent Labs  Lab 03/25/18 0059 03/25/18 0516 03/25/18 1712 03/19/2018 1002  WBC 16.2* 13.7* 22.8* 22.9*  NEUTROABS 14.8* 11.6*  --   --   HGB 7.5* 6.9* 8.4* 9.0*  HCT 27.1* 24.3* 30.1* 33.0*  MCV 79.7* 78.6* 80.3 82.7  PLT 285 [phone]

## 2018-04-09 NOTE — Progress Notes (Signed)
Hypoglycemic Event  CBG:48 Treatment: D50 IV 50 mL  Symptoms: Sweaty  Follow-up CBG: Time:1236 CBG Result:131  Possible Reasons for Event: Inadequate meal intake  Comments/MD notified:none,protocol followed    Tammy SoursBarlow, Maryana Pittmon

## 2018-04-09 NOTE — Progress Notes (Signed)
Hypoglycemic Event  CBG: 63  Treatment: D50 IV 25 mL  Symptoms: None  Follow-up CBG: Time:436 CBG Result:100  Possible Reasons for Event: Inadequate meal intake    [name] J [name]

## 2018-04-09 NOTE — Progress Notes (Signed)
At 1300 pt began having shortness of breath, and oxygen sats were maintaining in the 70's to 80's. Pt requested the chest physiotherapy be turned on the bed, also gave pt a breathing treatment. Pt began to become more lethargic and oxygen not out of the 80's on 100% venti mask. Pt is DNR. Alerted pts son and niece [name](Myrtle) via phone. Pts' nephew was at the bedside. Pt expired at 1350 with RN,MD, and nephew at bedside. Melodye PedAngela Vernice Bowker,RN

## 2018-04-09 NOTE — Discharge Summary (Signed)
[facility] Death Summary   Patient: Jamie LippsBetty G [name] ZOX:[id number]   PCP: Mila PalmerWolters, Sharon, MD DOB: 05/17/1932   Date of admission: 04/05/2018   Date and time of death: 2017/12/28 @ 13:50   Hospital Diagnoses:  Cause of death Sepsis due to aspiration pneumonia.  Principal Problem:   Aspiration pneumonia (HCC) Active Problems:   Sepsis (HCC)   Depression   ESRD (end stage renal disease) on dialysis (HCC)   HCAP (healthcare-associated pneumonia)   Stroke (cerebrum) (HCC)   Anemia in ESRD (end-stage renal disease) (HCC)   Atrial fibrillation with RVR (HCC)   Acute on chronic respiratory failure with hypoxia (HCC)   Pneumonia  History of present illness: As per the H and P dictated on admission, "[name] is a 84 y.o. female with medical history significant of diet-controlled diabetes, stroke, GERD, gout, depression, anemia, ESRD-HD (TTS), bilateral BKA, MRSA bacteremia, obesity, atrial fibrillation on Eliquis, chronic diarrhea, who presents with shortness breath and cough.  Patient states that developed shortness of breath today, which has been progressively getting worse.  She also has cough with yellow-colored sputum production.  No runny nose or sore throat. She denies chest pain, fever or chills.  She states that she has chronic diarrhea, which has not changed.  No nausea, vomiting or abdominal pain currently.  Per her niece, patient had nausea vomiting 2 days ago.  Patient does not make urine anymore.  No unilateral weakness.  She had dialysis on Thursday. Per her niece, patient sometimes chokes when eating food. Patient was initially hypotensive with blood pressure 78/36, which improved to 92/70 after 1L liter of Ringer's solution in ED. Pt has tunneled dialysis catheter on the right upper chest."  Hospital Course:  Acute respiratory failure with hypoxia  Septic shock due to aspiration pneumonia HCAP:  Patient likely had aspiration pneumonia  given infiltration on the  right base on chest x-ray,  choking when eating food.   Patient meets criteria for sepsis with leukocytosis, tachycardia, tachypnea, hypotension.   Lactic acid is elevated 4.69.  Blood pressure responded to IV fluid, but later on dropped again and needed IV pressors, she failed to response so she was also given blood transfusion. IV Antibiotics were given. She was still not responding to aggressive treatment. Her heart rate started dropping and mentation started to get worse.  Family was at bedside and it was decided to maintain pt comfort care.  ESRD (end stage renal disease) on dialysis (TTS):  potassium 3.3, bicarbonate 20, creatinine 3.40, BUN 17. please call renal for dialysis in morning  Depression:  Stable, no suicidal or homicidal ideations. Continue home medications: Remeron  Stroke (cerebrum) (HCC): -on plavix  Anemia in ESRD (end-stage renal disease) (HCC):  Hemoglobin low with hypotensive. Pt was given blood transfusion. Tolerated it well.  Atrial fibrillation with RVR (HCC): Heart rate up to 150s--> 110-120s currently. CHA2DS2-VASc Score is 8, needs oral anticoagulation.  Patient is on Eliquis Was on amiodarone  Mild hypokalemia:  Replaced   The patient was pronounced deceased at 13:50, on 2017/12/28 .  Procedures and Results:  none   Consultations:  PCCM   Nephrology  The results of significant diagnostics from this hospitalization (including imaging, microbiology, ancillary and laboratory) are listed below for reference.    Significant Diagnostic Studies: Ct Abdomen Pelvis Wo Contrast  Result Date: 03/05/2018 CLINICAL DATA:  Abdominal pain. Leukocytosis. Clinical concern for colitis. Dialysis patient. EXAM: CT ABDOMEN AND PELVIS WITHOUT CONTRAST TECHNIQUE: Multidetector CT imaging of the abdomen  and pelvis was performed following the standard protocol without IV contrast. COMPARISON:  12/30/2006. FINDINGS: Lower chest: Dense coronary artery  calcifications. Mild bilateral lower lobe atelectasis. Small right pleural effusion. Hepatobiliary: Multiple gallstones in the gallbladder. The largest measures 1.9 cm. No gallbladder wall thickening or pericholecystic fluid. Diffuse arterial calcifications in the hepatic arteries. Otherwise, unremarkable liver. Pancreas: Unremarkable. No pancreatic ductal dilatation or surrounding inflammatory changes. Spleen: Normal in size without focal abnormality. Arterial calcifications. Adrenals/Urinary Tract: Normal appearing adrenal glands. Small kidneys. No significant urine in the urinary bladder. Unremarkable ureters. Stomach/Bowel: Scattered colonic diverticula. No evidence of diverticulitis or appendicitis. Pronounced low density concentric inferior rectal wall thickening with a maximum thickness of 1.9 cm on image number 85 series 3, with marked luminal narrowing, extending to the anus. On coronal image number 76, this has a mass-like appearance, measuring 4.5 x 4.5 cm . Unremarkable stomach and small bowel. Vascular/Lymphatic: Extensive arterial calcifications. No aneurysm or enlarged lymph nodes. Reproductive: Multiple small uterine fibroids. Normal appearing ovaries. Other: No abdominal wall hernia or abnormality. No abdominopelvic ascites. Musculoskeletal: Lumbar and lower thoracic spine degenerative changes. Diffuse osteopenia. IMPRESSION: 1. Pronounced low density concentric inferior rectal wall thickening with marked luminal narrowing, extending to the anus. This has a mass-like appearance. This is concerning for a primary rectal carcinoma or marked focal proctitis. 2. Cholelithiasis without evidence of cholecystitis. 3. Colonic diverticulosis. 4. Extensive atheromatous arterial calcifications, including the coronary arteries. 5. Small right pleural effusion. 6. Mild bilateral lower lobe atelectasis. Electronically Signed   By: Beckie Salts M.D.   On: 03/05/2018 13:36   Dg Chest Port 1 View  Result Date:  03/23/2018 CLINICAL DATA:  Acute respiratory failure with hypoxia EXAM: PORTABLE CHEST 1 VIEW COMPARISON:  Limb 15 2019 FINDINGS: Multifocal patchy opacities in the bilateral upper and lower lobes, suspicious for pneumonia. No pleural effusion or pneumothorax. Mild cardiomegaly. Right chest dual lumen catheter terminates in the upper right atrium. IMPRESSION: Multifocal patchy opacities in the bilateral upper and lower lobes, suspicious for pneumonia. Electronically Signed   By: Charline Bills M.D.   On: 04/06/2018 07:03   Dg Chest Port 1 View  Result Date: April 20, 2018 CLINICAL DATA:  Acute onset of shortness of breath and wheezing. Diffuse crackles. EXAM: PORTABLE CHEST 1 VIEW COMPARISON:  Chest radiograph performed 01/08/2018 FINDINGS: The lungs are hypoexpanded. Right basilar airspace opacity raises concern for pneumonia. No pleural effusion or pneumothorax is seen. The cardiomediastinal silhouette is borderline normal in size. A right-sided dual-lumen catheter is noted ending at the right atrium. No acute osseous abnormalities are seen. IMPRESSION: Lungs hypoexpanded. Right basilar airspace opacity raises concern for pneumonia. Electronically Signed   By: Roanna Raider M.D.   On: Apr 20, 2018 23:58    Microbiology: Recent Results (from the past 240 hour(s))  Blood culture (routine x 2)     Status: None   Collection Time: 03/25/18 12:18 AM  Result Value Ref Range Status   Specimen Description BLOOD RIGHT ANTECUBITAL  Final   Special Requests   Final    BOTTLES DRAWN AEROBIC ONLY Blood Culture results may not be optimal due to an inadequate volume of blood received in culture bottles   Culture   Final    NO GROWTH 5 DAYS Performed at [facility], [address], [place], [place] [address]    Report Status 03/30/2018 FINAL  Final  Blood culture (routine x 2)     Status: None   Collection Time: 03/25/18  1:00 AM  Result  Value Ref Range Status   Specimen Description BLOOD LEFT ARM   Final   Special Requests   Final    BOTTLES DRAWN AEROBIC AND ANAEROBIC Blood Culture adequate volume   Culture   Final    NO GROWTH 5 DAYS Performed at [facility], [address], [place], [place] [address]    Report Status 03/30/2018 FINAL  Final  MRSA PCR Screening     Status: None   Collection Time: 03/25/18 11:25 AM  Result Value Ref Range Status   MRSA by PCR NEGATIVE NEGATIVE Final    Comment:        The GeneXpert MRSA Assay (FDA approved for NASAL specimens only), is one component of a comprehensive MRSA colonization surveillance program. It is not intended to diagnose MRSA infection nor to guide or monitor treatment for MRSA infections. Performed at [facility], [address] [place], [place] [address]   C difficile quick scan w PCR reflex     Status: None   Collection Time: 03/25/18 12:59 PM  Result Value Ref Range Status   C Diff antigen NEGATIVE NEGATIVE Final   C Diff toxin NEGATIVE NEGATIVE Final   C Diff interpretation No C. difficile detected.  Final    Comment: Performed at [facility], [address] [place], [place] [address]     Labs: CBC: Recent Labs  Lab 03/25/18 0024 03/25/18 0059 03/25/18 0516 03/25/18 1712 03/12/2018 1002  WBC  --  16.2* 13.7* 22.8* 22.9*  NEUTROABS  --  14.8* 11.6*  --   --   HGB 11.6* 7.5* 6.9* 8.4* 9.0*  HCT 34.0* 27.1* 24.3* 30.1* 33.0*  MCV  --  79.7* 78.6* 80.3 82.7  PLT  --  285 260 253 369   Basic Metabolic Panel: Recent Labs  Lab 03/25/18 0024 03/25/18 0059 03/27/2018 1002  NA 134* 137 138  K 5.3* 3.3* 3.6  CL 107 103 107  CO2  --  20* 18*  GLUCOSE 152* 171* 206*  BUN 25* 17 11  CREATININE 3.40* 3.40* 2.15*  CALCIUM  --  7.1* 6.6*   Liver Function Tests: No results for input(s): AST, ALT, ALKPHOS, BILITOT, PROT, ALBUMIN in the last 168 hours. No results for input(s): LIPASE, AMYLASE in the last 168 hours. No results for input(s): AMMONIA in the last 168 hours. Cardiac  Enzymes: No results for input(s): CKTOTAL, CKMB, CKMBINDEX, TROPONINI in the last 168 hours. BNP (last 3 results) Recent Labs    03/25/18 0059  BNP 609.2*   CBG: Recent Labs  Lab 03/25/2018 0354 03/20/2018 0436 03/17/2018 0833 03/12/2018 1202 04/01/2018 1236  GLUCAP 63* 100* 92 48* 131*   Time spent: 35 minutes  Signed:  Lynden Oxford  [facility] 03/31/2018, 7:57 AM

## 2018-04-09 DEATH — deceased
